# Patient Record
Sex: Male | Born: 1965
Health system: Southern US, Community
[De-identification: ages and names within clinical notes are randomized; demographics above are authoritative.]

## PROBLEM LIST (undated history)

## (undated) DIAGNOSIS — M79672 Pain in left foot: Secondary | ICD-10-CM

## (undated) DIAGNOSIS — R55 Syncope and collapse: Secondary | ICD-10-CM

## (undated) DIAGNOSIS — I219 Acute myocardial infarction, unspecified: Secondary | ICD-10-CM

## (undated) DIAGNOSIS — I2510 Atherosclerotic heart disease of native coronary artery without angina pectoris: Secondary | ICD-10-CM

## (undated) DIAGNOSIS — M79604 Pain in right leg: Secondary | ICD-10-CM

## (undated) DIAGNOSIS — R531 Weakness: Secondary | ICD-10-CM

## (undated) DIAGNOSIS — E870 Hyperosmolality and hypernatremia: Secondary | ICD-10-CM

## (undated) DIAGNOSIS — I739 Peripheral vascular disease, unspecified: Secondary | ICD-10-CM

## (undated) DIAGNOSIS — I10 Essential (primary) hypertension: Secondary | ICD-10-CM

## (undated) DIAGNOSIS — M79671 Pain in right foot: Secondary | ICD-10-CM

## (undated) DIAGNOSIS — E079 Disorder of thyroid, unspecified: Secondary | ICD-10-CM

## (undated) DIAGNOSIS — E039 Hypothyroidism, unspecified: Secondary | ICD-10-CM

## (undated) DIAGNOSIS — R350 Frequency of micturition: Secondary | ICD-10-CM

## (undated) DIAGNOSIS — N179 Acute kidney failure, unspecified: Secondary | ICD-10-CM

## (undated) DIAGNOSIS — D696 Thrombocytopenia, unspecified: Secondary | ICD-10-CM

## (undated) DIAGNOSIS — E785 Hyperlipidemia, unspecified: Secondary | ICD-10-CM

## (undated) DIAGNOSIS — M79605 Pain in left leg: Secondary | ICD-10-CM

## (undated) DIAGNOSIS — J449 Chronic obstructive pulmonary disease, unspecified: Secondary | ICD-10-CM

## (undated) DIAGNOSIS — N189 Chronic kidney disease, unspecified: Secondary | ICD-10-CM

## (undated) DIAGNOSIS — R42 Dizziness and giddiness: Secondary | ICD-10-CM

## (undated) DIAGNOSIS — K219 Gastro-esophageal reflux disease without esophagitis: Secondary | ICD-10-CM

## (undated) DIAGNOSIS — I509 Heart failure, unspecified: Secondary | ICD-10-CM

## (undated) DIAGNOSIS — E119 Type 2 diabetes mellitus without complications: Secondary | ICD-10-CM

## (undated) DIAGNOSIS — R739 Hyperglycemia, unspecified: Secondary | ICD-10-CM

## (undated) DIAGNOSIS — I428 Other cardiomyopathies: Secondary | ICD-10-CM

## (undated) HISTORY — DX: Hyperosmolality and hypernatremia: E87.0

## (undated) HISTORY — DX: Pain in right foot: M79.671

## (undated) HISTORY — DX: Acute kidney failure, unspecified: N17.9

## (undated) HISTORY — DX: Hyperlipidemia, unspecified: E78.5

## (undated) HISTORY — DX: Pain in left leg: M79.605

## (undated) HISTORY — PX: MULTIPLE TOOTH EXTRACTIONS: SHX2053

## (undated) HISTORY — DX: Atherosclerotic heart disease of native coronary artery without angina pectoris: I25.10

## (undated) HISTORY — DX: Pain in left foot: M79.672

## (undated) HISTORY — DX: Thrombocytopenia, unspecified: D69.6

## (undated) HISTORY — DX: Chronic kidney disease, unspecified: N17.9

## (undated) HISTORY — PX: VASCULAR SURGERY: SHX849

## (undated) HISTORY — DX: Peripheral vascular disease, unspecified: I73.9

## (undated) HISTORY — DX: Essential (primary) hypertension: I10

## (undated) HISTORY — PX: CARDIAC CATHETERIZATION: SHX172

## (undated) HISTORY — DX: Pain in right leg: M79.604

## (undated) HISTORY — DX: Chronic kidney disease, unspecified: N18.9

## (undated) HISTORY — DX: Frequency of micturition: R35.0

## (undated) HISTORY — DX: Dizziness and giddiness: R42

## (undated) HISTORY — DX: Hypothyroidism, unspecified: E03.9

## (undated) HISTORY — DX: Disorder of thyroid, unspecified: E07.9

## (undated) HISTORY — DX: Hyperglycemia, unspecified: R73.9

## (undated) HISTORY — DX: Other cardiomyopathies: I42.8

## (undated) HISTORY — DX: Type 2 diabetes mellitus without complications: E11.9

## (undated) HISTORY — DX: Weakness: R53.1

## (undated) HISTORY — DX: Syncope and collapse: R55

---

## 2012-12-30 ENCOUNTER — Ambulatory Visit (HOSPITAL_COMMUNITY): Payer: Self-pay | Admitting: Psychology

## 2018-10-30 DIAGNOSIS — E870 Hyperosmolality and hypernatremia: Secondary | ICD-10-CM

## 2018-10-30 HISTORY — DX: Hyperosmolality and hypernatremia: E87.0

## 2019-01-29 ENCOUNTER — Encounter: Payer: Self-pay | Admitting: Vascular Surgery

## 2019-02-04 ENCOUNTER — Ambulatory Visit (INDEPENDENT_AMBULATORY_CARE_PROVIDER_SITE_OTHER): Payer: Managed Care, Other (non HMO) | Admitting: Vascular Surgery

## 2019-02-04 ENCOUNTER — Other Ambulatory Visit: Payer: Self-pay | Admitting: *Deleted

## 2019-02-04 ENCOUNTER — Other Ambulatory Visit: Payer: Self-pay

## 2019-02-04 ENCOUNTER — Encounter: Payer: Self-pay | Admitting: *Deleted

## 2019-02-04 ENCOUNTER — Encounter: Payer: Self-pay | Admitting: Vascular Surgery

## 2019-02-04 VITALS — BP 189/98 | HR 106 | Temp 97.7°F | Resp 20 | Ht 73.0 in | Wt 182.0 lb

## 2019-02-04 DIAGNOSIS — I998 Other disorder of circulatory system: Secondary | ICD-10-CM | POA: Diagnosis not present

## 2019-02-04 DIAGNOSIS — I70229 Atherosclerosis of native arteries of extremities with rest pain, unspecified extremity: Secondary | ICD-10-CM

## 2019-02-04 NOTE — Progress Notes (Signed)
[facility]in [facility]ist of [place]  Patient name: Edward Rocha. MRN: [id number] DOB: November 11, 1965 Sex: male  REASON FOR CONSULT: Evaluation of bilateral lower extremity rest pain and critical limb ischemia  HPI: Edward Rocha. is a 53 y.o. male, who is here today for evaluation.  He is very poor medical compliance.  He had a hypoglycemic episode at [facility] rocking him and was seen in the emergency department on 01/14/2019.  Had blood sugar in excess of 600 and has renal insufficiency with a creatinine of 2.6.  He was treated and eventually discharged home.  He has severe pain in both lower extremities.  He does report claudication symptoms as well.  He has no open ulceration but does have very ischemic appearing feet with the scaling and cracking of the skin.  He has no history of cardiac disease.  Past Medical History:  Diagnosis Date  . Acute renal failure (ARF) ([place])   . Bilateral pain of leg and foot   . Diabetes mellitus without complication ([place])    Type II  . Dizziness   . Frequent urination   . Hyperglycemia    Non-Ketotic Hyperosmolar.  . Hyperlipidemia   . Hypernatremia   . Hypertension   . Peripheral vascular disease ([place])   . Syncope   . Thrombocytopenia ([place])   . Thyroid disease    Hypothyroidism  . Weakness     History reviewed. No pertinent family history.  SOCIAL HISTORY: Social History   Socioeconomic History  . Marital status: Married    Spouse name: Not on file  . Number of children: Not on file  . Years of education: Not on file  . Highest education level: Not on file  Occupational History  . Not on file  Social Needs  . Financial resource strain: Not on file  . Food insecurity:    Worry: Not on file    Inability: Not on file  . Transportation needs:    Medical: Not on file    Non-medical: Not on file  Tobacco Use  . Smoking status: Current Every Day Smoker    Packs/day: 1.00    Years: 36.00    Pack  years: 36.00    Types: Cigarettes  . Smokeless tobacco: Never Used  Substance and Sexual Activity  . Alcohol use: Never    Frequency: Never  . Drug use: Never  . Sexual activity: Not on file  Lifestyle  . Physical activity:    Days per week: Not on file    Minutes per session: Not on file  . Stress: Not on file  Relationships  . Social connections:    Talks on phone: Not on file    Gets together: Not on file    Attends religious service: Not on file    Active member of club or organization: Not on file    Attends meetings of clubs or organizations: Not on file    Relationship status: Not on file  . Intimate partner violence:    Fear of current or ex partner: Not on file    Emotionally abused: Not on file    Physically abused: Not on file    Forced sexual activity: Not on file  Other Topics Concern  . Not on file  Social History Narrative  . Not on file    No Known Allergies  Current Outpatient Medications  Medication Sig Dispense Refill  . amLODipine (NORVASC) 10 MG tablet Take 10 mg by mouth  daily.    . ATORVASTATIN CALCIUM PO Take 50 mg by mouth.    . chlorthalidone (HYGROTON) 25 MG tablet Take 25 mg by mouth daily.    . clopidogrel (PLAVIX) 75 MG tablet Take 75 mg by mouth daily.    . empagliflozin (JARDIANCE) 10 MG TABS tablet Take 10 mg by mouth daily.    Marland Kitchen gabapentin (NEURONTIN) 300 MG capsule Take 300 mg by mouth 3 (three) times daily.    Marland Kitchen glipiZIDE (GLUCOTROL) 5 MG tablet Take by mouth 2 (two) times daily before a meal.    . insulin glargine (LANTUS) 100 UNIT/ML injection Inject 20 Units into the skin daily.    Marland Kitchen labetalol (NORMODYNE) 300 MG tablet Take 300 mg by mouth 2 (two) times daily.    . Levothyroxine Sodium (SYNTHROID PO) Take by mouth.     No current facility-administered medications for this visit.     REVIEW OF SYSTEMS:  [X]  denotes positive finding, [ ]  denotes negative finding Cardiac  Comments:  Chest pain or chest pressure:    Shortness of  breath upon exertion:    Short of breath when lying flat:    Irregular heart rhythm:        Vascular    Pain in calf, thigh, or hip brought on by ambulation: x   Pain in feet at night that wakes you up from your sleep:  x   Blood clot in your veins:    Leg swelling:         Pulmonary    Oxygen at home:    Productive cough:     Wheezing:         Neurologic    Sudden weakness in arms or legs:     Sudden numbness in arms or legs:     Sudden onset of difficulty speaking or slurred speech:    Temporary loss of vision in one eye:     Problems with dizziness:         Gastrointestinal    Blood in stool:     Vomited blood:         Genitourinary    Burning when urinating:     Blood in urine:        Psychiatric    Major depression:         Hematologic    Bleeding problems:    Problems with blood clotting too easily:        Skin    Rashes or ulcers:        Constitutional    Fever or chills:      PHYSICAL EXAM: Vitals:   02/04/19 0857  BP: (!) 189/98  Pulse: (!) 106  Resp: 20  Temp: 97.7 F (36.5 C)  SpO2: 98%  Weight: 182 lb (82.6 kg)  Height: 6\' 1"  (1.854 m)    GENERAL: The patient is a well-nourished male, in no acute distress. The vital signs are documented above. CARDIOVASCULAR: Palpable radial pulses bilaterally.  Does have palpable femoral pulses with absent popliteal and distal pulses PULMONARY: There is good air exchange  ABDOMEN: Soft and non-tender  MUSCULOSKELETAL: There are no major deformities or cyanosis. NEUROLOGIC: No focal weakness or paresthesias are detected. SKIN: There are no ulcers or rashes noted. PSYCHIATRIC: The patient has a normal affect.  DATA:  Noninvasive studies at [facility] rocking him revealed monophasic noncompressible vessels in both lower extremities.  He does of note have right brachial pressure of 188 and left of 137  MEDICAL  ISSUES: Discussed these in detail with the patient.  Clearly has critical limb ischemia with threatened  limb loss bilaterally.  Recommended arteriography for further evaluation as an outpatient.  Explained that he may be a candidate for endovascular treatment.  Also explained that with his renal insufficiency, this does put him at some risk for arteriogram but certainly is a limb threatening ischemia.  Will minimize contrast.   Rosetta Posner, MD FACS [facility] Tel [phone] Pager [phone]

## 2019-02-04 NOTE — Progress Notes (Signed)
Left message on voice mail to arrive at [facility] admitting at 7:15 am on 02/06/2019 for procedure. All other instructions unchanged per letter given to patient in office. Verbalized understanding.

## 2019-02-06 ENCOUNTER — Other Ambulatory Visit: Payer: Self-pay

## 2019-02-06 ENCOUNTER — Ambulatory Visit (HOSPITAL_COMMUNITY)
Admission: RE | Admit: 2019-02-06 | Discharge: 2019-02-06 | Disposition: A | Discharge status: Home / Self Care | Payer: Managed Care, Other (non HMO) | Attending: Vascular Surgery | Admitting: Vascular Surgery

## 2019-02-06 ENCOUNTER — Encounter (HOSPITAL_COMMUNITY)
Admission: RE | Disposition: A | Discharge status: Home / Self Care | Payer: Self-pay | Source: Home / Self Care | Attending: Vascular Surgery

## 2019-02-06 DIAGNOSIS — E039 Hypothyroidism, unspecified: Secondary | ICD-10-CM | POA: Diagnosis not present

## 2019-02-06 DIAGNOSIS — I10 Essential (primary) hypertension: Secondary | ICD-10-CM | POA: Diagnosis not present

## 2019-02-06 DIAGNOSIS — Z79890 Hormone replacement therapy (postmenopausal): Secondary | ICD-10-CM | POA: Diagnosis not present

## 2019-02-06 DIAGNOSIS — E1151 Type 2 diabetes mellitus with diabetic peripheral angiopathy without gangrene: Secondary | ICD-10-CM | POA: Insufficient documentation

## 2019-02-06 DIAGNOSIS — I70223 Atherosclerosis of native arteries of extremities with rest pain, bilateral legs: Secondary | ICD-10-CM

## 2019-02-06 DIAGNOSIS — E785 Hyperlipidemia, unspecified: Secondary | ICD-10-CM | POA: Diagnosis not present

## 2019-02-06 DIAGNOSIS — Z794 Long term (current) use of insulin: Secondary | ICD-10-CM | POA: Insufficient documentation

## 2019-02-06 DIAGNOSIS — Z79899 Other long term (current) drug therapy: Secondary | ICD-10-CM | POA: Insufficient documentation

## 2019-02-06 DIAGNOSIS — F17210 Nicotine dependence, cigarettes, uncomplicated: Secondary | ICD-10-CM | POA: Diagnosis not present

## 2019-02-06 HISTORY — PX: PERIPHERAL VASCULAR INTERVENTION: CATH118257

## 2019-02-06 HISTORY — PX: ABDOMINAL AORTOGRAM W/LOWER EXTREMITY: CATH118223

## 2019-02-06 LAB — POCT ACTIVATED CLOTTING TIME
Activated Clotting Time: 400 seconds
Activated Clotting Time: 301 seconds
Activated Clotting Time: 257 seconds
Activated Clotting Time: 202 seconds
Activated Clotting Time: 169 seconds
Activated Clotting Time: 186 seconds

## 2019-02-06 LAB — GLUCOSE, CAPILLARY: Glucose-Capillary: 152 mg/dL — ABNORMAL HIGH (ref 70–99)

## 2019-02-06 LAB — POCT I-STAT 4, (NA,K, GLUC, HGB,HCT)
Glucose, Bld: 188 mg/dL — ABNORMAL HIGH (ref 70–99)
HCT: 36 % — ABNORMAL LOW (ref 39.0–52.0)
Hemoglobin: 12.2 g/dL — ABNORMAL LOW (ref 13.0–17.0)
Potassium: 3.9 mmol/L (ref 3.5–5.1)
Sodium: 139 mmol/L (ref 135–145)

## 2019-02-06 LAB — POCT I-STAT CREATININE: Creatinine, Ser: 0.9 mg/dL (ref 0.61–1.24)

## 2019-02-06 SURGERY — ABDOMINAL AORTOGRAM W/LOWER EXTREMITY
Anesthesia: LOCAL

## 2019-02-06 MED ORDER — LIDOCAINE HCL (PF) 1 % IJ SOLN
INTRAMUSCULAR | Status: AC
  Filled 2019-02-06: qty 30

## 2019-02-06 MED ORDER — HEPARIN SODIUM (PORCINE) 1000 UNIT/ML IJ SOLN
INTRAMUSCULAR | Status: DC | PRN
  Administered 2019-02-06: 9000 [IU] via INTRAVENOUS

## 2019-02-06 MED ORDER — HEPARIN (PORCINE) IN NACL 1000-0.9 UT/500ML-% IV SOLN
INTRAVENOUS | Status: DC | PRN
  Administered 2019-02-06: 500 mL

## 2019-02-06 MED ORDER — HEPARIN SODIUM (PORCINE) 1000 UNIT/ML IJ SOLN
INTRAMUSCULAR | Status: AC
  Filled 2019-02-06: qty 1

## 2019-02-06 MED ORDER — HYDRALAZINE HCL 20 MG/ML IJ SOLN
INTRAMUSCULAR | Status: AC
  Filled 2019-02-06: qty 1

## 2019-02-06 MED ORDER — SODIUM CHLORIDE 0.9 % IV SOLN
INTRAVENOUS | Status: DC
  Administered 2019-02-06: 07:00:00 via INTRAVENOUS

## 2019-02-06 MED ORDER — SODIUM CHLORIDE 0.9 % IV SOLN: 250.0000 mL | INTRAVENOUS | Status: DC | PRN

## 2019-02-06 MED ORDER — SODIUM CHLORIDE 0.9% FLUSH: 3.0000 mL | INTRAVENOUS | Status: DC | PRN

## 2019-02-06 MED ORDER — HYDRALAZINE HCL 20 MG/ML IJ SOLN
5.0000 mg | INTRAMUSCULAR | Status: DC | PRN
  Administered 2019-02-06: 5 mg via INTRAVENOUS

## 2019-02-06 MED ORDER — SODIUM CHLORIDE 0.9% FLUSH: 3.0000 mL | Freq: Two times a day (BID) | INTRAVENOUS | Status: DC

## 2019-02-06 MED ORDER — ACETAMINOPHEN 325 MG PO TABS: 650.0000 mg | ORAL_TABLET | ORAL | Status: DC | PRN

## 2019-02-06 MED ORDER — SODIUM CHLORIDE 0.9 % WEIGHT BASED INFUSION: 1.0000 mL/kg/h | INTRAVENOUS | Status: DC

## 2019-02-06 MED ORDER — LABETALOL HCL 5 MG/ML IV SOLN
INTRAVENOUS | Status: DC | PRN
  Administered 2019-02-06: 20 mg via INTRAVENOUS

## 2019-02-06 MED ORDER — LABETALOL HCL 5 MG/ML IV SOLN: 10.0000 mg | INTRAVENOUS | Status: DC | PRN

## 2019-02-06 MED ORDER — HEPARIN (PORCINE) IN NACL 1000-0.9 UT/500ML-% IV SOLN
INTRAVENOUS | Status: AC
  Filled 2019-02-06: qty 1000

## 2019-02-06 MED ORDER — LIDOCAINE HCL (PF) 1 % IJ SOLN
INTRAMUSCULAR | Status: DC | PRN
  Administered 2019-02-06 (×2): 15 mL

## 2019-02-06 MED ORDER — IODIXANOL 320 MG/ML IV SOLN
INTRAVENOUS | Status: DC | PRN
  Administered 2019-02-06: 12:00:00 150 mL via INTRAVENOUS

## 2019-02-06 MED ORDER — ONDANSETRON HCL 4 MG/2ML IJ SOLN: 4.0000 mg | Freq: Four times a day (QID) | INTRAMUSCULAR | Status: DC | PRN

## 2019-02-06 SURGICAL SUPPLY — 22 items
CATH BEACON 5 .035 65 KMP TIP (CATHETERS) ×6 IMPLANT
CATH OMNI FLUSH 5F 65CM (CATHETERS) ×3 IMPLANT
CLOSURE MYNX CONTROL 6F/7F (Vascular Products) ×3 IMPLANT
DEVICE TORQUE .025-.038 (MISCELLANEOUS) ×3 IMPLANT
FILTER CO2 0.2 MICRON (VASCULAR PRODUCTS) ×3 IMPLANT
GUIDEWIRE ANGLED .035X150CM (WIRE) ×3 IMPLANT
KIT ENCORE 26 ADVANTAGE (KITS) ×6 IMPLANT
KIT MICROPUNCTURE NIT STIFF (SHEATH) ×3 IMPLANT
KIT PV (KITS) ×3 IMPLANT
RESERVOIR CO2 (VASCULAR PRODUCTS) ×3 IMPLANT
SET FLUSH CO2 (MISCELLANEOUS) ×3 IMPLANT
SHEATH BRITE TIP 7FR 35CM (SHEATH) ×6 IMPLANT
SHEATH PINNACLE 5F 10CM (SHEATH) ×6 IMPLANT
SHEATH PINNACLE 7F 10CM (SHEATH) ×6 IMPLANT
SHEATH PROBE COVER 6X72 (BAG) ×3 IMPLANT
STENT VIABAHN 8X39X80 VBX (Permanent Stent) ×6 IMPLANT
SYR MEDRAD MARK 7 150ML (SYRINGE) ×3 IMPLANT
TRANSDUCER W/STOPCOCK (MISCELLANEOUS) ×3 IMPLANT
TRAY PV CATH (CUSTOM PROCEDURE TRAY) ×3 IMPLANT
WIRE AMPLATZ SS-J .035X180CM (WIRE) ×6 IMPLANT
WIRE BENTSON .035X145CM (WIRE) ×6 IMPLANT
WIRE TORQFLEX AUST .018X40CM (WIRE) ×6 IMPLANT

## 2019-02-06 NOTE — Discharge Instructions (Signed)

## 2019-02-06 NOTE — Op Note (Signed)
Patient name: Edward Rocha. MRN: [id number] DOB: [birth year] Sex: male  02/06/2019 Pre-operative Diagnosis: Critical limb ischemia of the bilateral lower extremities with rest pain Post-operative diagnosis:  Same Surgeon:  [name], MD Procedure Performed: 1.  Ultrasound-guided access of the right common femoral artery 2.  Aortogram with bilateral lower extremity arteriogram  3.  Ultrasound-guided access of the left common femoral artery 4.  Bilateral common iliac artery angioplasty with balloon expandable stent placement (8 mm x 39 mm VBX covered stent) 5.  Mynx closure of the left common femoral artery  Indications: Patient is a 53 year old male who was recently seen in clinic for bilateral lower extremity rest pain with his left leg more severe than the right.  He presents today for aortogram and bilateral lower extremity arteriogram and possible intervention.  On my initial evaluation in holding patient does have weak femoral pulses bilaterally.  We discussed risks and benefits.  Findings:  Aortogram showed patent single renal arteries bilaterally.  He has a very calcified aortoiliac segment.  The bilateral common iliac arteries are stenotic with approximate 50% stenosis of the right proximal common iliac and a greater than 80 to 90% stenosis of the left common iliac.  Both hypogastric arteries are patent although they are severely diseased with calcified high-grade stenosis proximally.  Bilateral external iliacs are widely patent.  The right common femoral artery has a large calcific plaque just before the takeoff of the SFA and the profunda likely creating a flow limiting stenosis. Left lower extremity runoff shows a patent common femoral, profunda, and SFA.  Patient has an occluded above and below-knee popliteal artery as well as an occluded trifurcation and reconstitutes what appears to be anterior tibial, posterior tibial, and peroneal with runoff into the foot. Right lower  extremity shows a patent common femoral that has got a large atherosclerotic plaque as previously noted with a patent profunda and SFA.  The above and below-knee popliteal artery are patent but there is a large calcific plaque in the below-knee popliteal artery at the takeoff of the anterior tibial the creates a greater than 90% high-grade stenosis.  The peroneal appears to be occluded proximally and reconstitutes.  The posterior tibial does appear to be patent.   Procedure:  The patient was identified in the holding area and taken to room 8.  The patient was then placed supine on the table and prepped and draped in the usual sterile fashion.  A time out was called.  Ultrasound was used to evaluate the right common femoral artery.  It was patent .  A digital ultrasound image was acquired.  A micropuncture needle was used to access the right common femoral artery under ultrasound guidance.  An 018 wire was advanced without resistance and a micropuncture sheath was placed.  The 018 wire was removed and a benson wire was placed.  The micropuncture sheath was exchanged for a 5 french sheath.  I could not get the Bentson wire to cross the right common iliac and as result I exchanged for a KMP catheter with a soft angled glide.  Ultimately once I was able to get my wire up into the infrarenal aorta across the right common iliac, I then exchanged back for a Bentson wire for more support.  I then put up a Omni Flush catheter and an aortogram was obtained with pertinent findings noted above.  Then pulled the catheter down to the aortic bifurcation and shot bilateral extremity runoff with pertinent findings  noted above.  Patient had severe common iliac disease bilaterally with weak femoral pulses so elected to treat this first.  I then used ultrasound to evaluate the left common femoral artery, it was patent, and an image was saved.  Under ultrasound guidance a micro access needle was placed in the left common femoral  artery.  Once the microwire was advanced, I exchanged for a micro-sheath in the left groin and again could not get a Benson wire to cross the left common iliac retrograde.  I then had placed a short 5 French sheath in the left groin and then using a KMP catheter and soft angled glide was able to cross the left common iliac in retrograde fashion.  Once I had my catheter up in the infrarenal aorta, I used a KMP to exchange for a Amplatz for more support.  We had selected 8 mm VBX stents for both iliacs, so I placed a long 7 French sheath bright tip sheath in the left groin.  The patient was given 100 units heparin per kilogram.  I then exchanged for Amplatz wire in the right groin and placed a long 7 French bright tip sheath as well.  Through retrograde injections of each sheath we identified the aorta bifurcation as well as the hypogastric arteries.  We selected a 8 mm x 39 mm covered VBX stents that were then placed through the sheath into the common iliacs bilaterally and the sheaths were pulled back so we did have to bareback the stents.  We then overinflated to nominal pressure of 16 to get an 8.5 mm VBX diameter stent and these were inflated simultaneously under fluoroscopic guidance.  We then removed balloon delivery devices from each groin and put a catheter back up the right groin and through the pigtail shot a final aortogram showed patent common iliacs bilaterally with no evidence of residual stenosis.  There was excellent flow down the iliacs.  I was concerned that there potentially could be a little atherosclerotic lesion still in the right iliac at the hypogastric so we did a left anterior oblique iliac shot on the right retrograde and that showed no residual stenosis there with excellent filling of the contralateral iliac as well through both stents are in place.  At that point time we exchanged for short 7 French sheaths in both groins.  All other wires and catheters were removed.  A mynx closure  device was deployed in the left groin given that that common femoral was patent.  He will be taken to PACU for removal of his right sheath with additional pressure given that he has atherosclerotic plaque with disease in the right common femoral artery and high concern to narrow that artery or Mynx failure.  Plan: I will plan to see the back in 1-2 months to see how he is doing after iliac stenting.  I anticipate if he has ongoing issues the next step could be a common femoral endarterectomy in the right groin.  On the left he has a popliteal occlusion of the above and below-knee popliteal artery that could also be addressed.    [name], MD [facility] of [place] Office: [phone] Pager: [place]

## 2019-02-06 NOTE — H&P (Signed)
History and Physical Interval Note:  02/06/2019 9:38 AM  Edward Patricia.  has presented today for surgery, with the diagnosis of pvd.  The various methods of treatment have been discussed with the patient and family. After consideration of risks, benefits and other options for treatment, the patient has consented to  Procedure(s): LOWER EXTREMITY ANGIOGRAPHY (N/A) as a surgical intervention.  The patient's history has been reviewed, patient examined, no change in status, stable for surgery.  I have reviewed the patient's chart and labs.  Questions were answered to the patient's satisfaction.    Aortogram, BLE arteriogram, BLE rest pain.  States left leg worse than right.  [name]  Vascular and Vein Specialist of [facility]  Patient name: Edward Rocha.         MRN: [id number]        DOB: Sep 19, 1966          Sex: male  REASON FOR CONSULT: Evaluation of bilateral lower extremity rest pain and critical limb ischemia  HPI: Edward Rocha. is a 53 y.o. male, who is here today for evaluation.  He is very poor medical compliance.  He had a hypoglycemic episode at [facility] rocking him and was seen in the emergency department on 01/14/2019.  Had blood sugar in excess of 600 and has renal insufficiency with a creatinine of 2.6.  He was treated and eventually discharged home.  He has severe pain in both lower extremities.  He does report claudication symptoms as well.  He has no open ulceration but does have very ischemic appearing feet with the scaling and cracking of the skin.  He has no history of cardiac disease.      Past Medical History:  Diagnosis Date  . Acute renal failure (ARF) ([place])   . Bilateral pain of leg and foot   . Diabetes mellitus without complication ([place])    Type II  . Dizziness   . Frequent urination   . Hyperglycemia    Non-Ketotic Hyperosmolar.  . Hyperlipidemia   . Hypernatremia   . Hypertension   . Peripheral vascular disease ([place])   . Syncope    . Thrombocytopenia ([place])   . Thyroid disease    Hypothyroidism  . Weakness     History reviewed. No pertinent family history.  SOCIAL HISTORY: Social History        Socioeconomic History  . Marital status: Married    Spouse name: Not on file  . Number of children: Not on file  . Years of education: Not on file  . Highest education level: Not on file  Occupational History  . Not on file  Social Needs  . Financial resource strain: Not on file  . Food insecurity:    Worry: Not on file    Inability: Not on file  . Transportation needs:    Medical: Not on file    Non-medical: Not on file  Tobacco Use  . Smoking status: Current Every Day Smoker    Packs/day: 1.00    Years: 36.00    Pack years: 36.00    Types: Cigarettes  . Smokeless tobacco: Never Used  Substance and Sexual Activity  . Alcohol use: Never    Frequency: Never  . Drug use: Never  . Sexual activity: Not on file  Lifestyle  . Physical activity:    Days per week: Not on file    Minutes per session: Not on file  . Stress: Not on file  Relationships  .  Social connections:    Talks on phone: Not on file    Gets together: Not on file    Attends religious service: Not on file    Active member of club or organization: Not on file    Attends meetings of clubs or organizations: Not on file    Relationship status: Not on file  . Intimate partner violence:    Fear of current or ex partner: Not on file    Emotionally abused: Not on file    Physically abused: Not on file    Forced sexual activity: Not on file  Other Topics Concern  . Not on file  Social History Narrative  . Not on file    No Known Allergies        Current Outpatient Medications  Medication Sig Dispense Refill  . amLODipine (NORVASC) 10 MG tablet Take 10 mg by mouth daily.    . ATORVASTATIN CALCIUM PO Take 50 mg by mouth.    . chlorthalidone (HYGROTON) 25 MG tablet Take 25 mg  by mouth daily.    . clopidogrel (PLAVIX) 75 MG tablet Take 75 mg by mouth daily.    . empagliflozin (JARDIANCE) 10 MG TABS tablet Take 10 mg by mouth daily.    Marland Kitchen gabapentin (NEURONTIN) 300 MG capsule Take 300 mg by mouth 3 (three) times daily.    Marland Kitchen glipiZIDE (GLUCOTROL) 5 MG tablet Take by mouth 2 (two) times daily before a meal.    . insulin glargine (LANTUS) 100 UNIT/ML injection Inject 20 Units into the skin daily.    Marland Kitchen labetalol (NORMODYNE) 300 MG tablet Take 300 mg by mouth 2 (two) times daily.    . Levothyroxine Sodium (SYNTHROID PO) Take by mouth.     No current facility-administered medications for this visit.     REVIEW OF SYSTEMS:  [X]  denotes positive finding, [ ]  denotes negative finding Cardiac  Comments:  Chest pain or chest pressure:    Shortness of breath upon exertion:    Short of breath when lying flat:    Irregular heart rhythm:        Vascular    Pain in calf, thigh, or hip brought on by ambulation: x   Pain in feet at night that wakes you up from your sleep:  x   Blood clot in your veins:    Leg swelling:         Pulmonary    Oxygen at home:    Productive cough:     Wheezing:         Neurologic    Sudden weakness in arms or legs:     Sudden numbness in arms or legs:     Sudden onset of difficulty speaking or slurred speech:    Temporary loss of vision in one eye:     Problems with dizziness:         Gastrointestinal    Blood in stool:     Vomited blood:         Genitourinary    Burning when urinating:     Blood in urine:        Psychiatric    Major depression:         Hematologic    Bleeding problems:    Problems with blood clotting too easily:        Skin    Rashes or ulcers:        Constitutional    Fever or chills:  PHYSICAL EXAM:    Vitals:   02/04/19 0857  BP: (!) 189/98  Pulse: (!) 106  Resp: 20  Temp:  97.7 F (36.5 C)  SpO2: 98%  Weight: 182 lb (82.6 kg)  Height: 6\' 1"  (1.854 m)    GENERAL: The patient is a well-nourished male, in no acute distress. The vital signs are documented above. CARDIOVASCULAR: Palpable radial pulses bilaterally.  Does have palpable femoral pulses with absent popliteal and distal pulses PULMONARY: There is good air exchange  ABDOMEN: Soft and non-tender  MUSCULOSKELETAL: There are no major deformities or cyanosis. NEUROLOGIC: No focal weakness or paresthesias are detected. SKIN: There are no ulcers or rashes noted. PSYCHIATRIC: The patient has a normal affect.  DATA:  Noninvasive studies at [facility] rocking him revealed monophasic noncompressible vessels in both lower extremities.  He does of note have right brachial pressure of 188 and left of 137  MEDICAL ISSUES: Discussed these in detail with the patient.  Clearly has critical limb ischemia with threatened limb loss bilaterally.  Recommended arteriography for further evaluation as an outpatient.  Explained that he may be a candidate for endovascular treatment.  Also explained that with his renal insufficiency, this does put him at some risk for arteriogram but certainly is a limb threatening ischemia.  Will minimize contrast.   Rosetta Posner, MD FACS [facility] Tel [phone] Pager [phone]

## 2019-02-06 NOTE — Progress Notes (Signed)
Site area: rt groin fa sheath pulled and pressure held by Edward Rocha Site Prior to Removal:  Level 0 Pressure Applied For: 25 minutes Manual:   yes Patient Status During Pull:  stable Rocha Pull Site:  Level 0 Rocha Pull Instructions Given:  yes Rocha Pull Pulses Present: rt dp dopplered Dressing Applied:  Gauze and tegaderm Bedrest begins @ 1600 Comments:

## 2019-02-07 ENCOUNTER — Encounter (HOSPITAL_COMMUNITY): Payer: Self-pay | Admitting: Vascular Surgery

## 2019-02-11 ENCOUNTER — Telehealth: Payer: Self-pay | Admitting: Vascular Surgery

## 2019-02-11 NOTE — Telephone Encounter (Signed)
-----   Message from [name], MD sent at 02/06/2019 12:16 PM EDT ----- Patient name: Edward Rocha.         MRN: [id number]        DOB: [birth year]          Sex: male  02/06/2019 Pre-operative Diagnosis: Critical limb ischemia of the bilateral lower extremities with rest pain Post-operative diagnosis:  Same Surgeon:  [name], MD Procedure Performed: 1.  Ultrasound-guided access of the right common femoral artery 2.  Aortogram with bilateral lower extremity arteriogram  3.  Ultrasound-guided access of the left common femoral artery 4.  Bilateral common iliac artery angioplasty with balloon expandable stent placement (8 mm x 39 mm VBX covered stent) 5.  Mynx closure of the left common femoral artery  #He can follow-up with me in 1-2 months with iliac duplex and ABI's.  Thanks,  Gerald Stabs

## 2019-02-11 NOTE — Telephone Encounter (Signed)
sch appt phone NA mld ltr 03/25/2019 9am Aorto/iliac 10am ABI 1015am p/o MD

## 2019-02-26 ENCOUNTER — Other Ambulatory Visit: Payer: Self-pay

## 2019-02-26 DIAGNOSIS — I998 Other disorder of circulatory system: Secondary | ICD-10-CM

## 2019-02-26 DIAGNOSIS — I70229 Atherosclerosis of native arteries of extremities with rest pain, unspecified extremity: Secondary | ICD-10-CM

## 2019-03-25 ENCOUNTER — Encounter: Payer: Self-pay | Admitting: Family

## 2019-03-25 ENCOUNTER — Encounter (HOSPITAL_COMMUNITY): Payer: Managed Care, Other (non HMO)

## 2019-03-25 ENCOUNTER — Encounter: Payer: Managed Care, Other (non HMO) | Admitting: Vascular Surgery

## 2019-04-15 ENCOUNTER — Telehealth: Payer: Self-pay

## 2019-04-15 NOTE — Telephone Encounter (Signed)
Wife called and asked that we call back regarding pt  Called and she said that he is having bilateral leg pain and his feet are hurting him worse. She said that he has had little relief since his procedure was done. She said that she would like to get him in to have someone at least take a look at him because he is having trouble just walking across the room.   Appt made for pt to be seen.   York Cerise, CMA

## 2019-04-18 ENCOUNTER — Other Ambulatory Visit: Payer: Self-pay

## 2019-04-18 ENCOUNTER — Ambulatory Visit (INDEPENDENT_AMBULATORY_CARE_PROVIDER_SITE_OTHER): Payer: Self-pay | Admitting: Family

## 2019-04-18 ENCOUNTER — Encounter: Payer: Self-pay | Admitting: Family

## 2019-04-18 VITALS — BP 173/94 | Temp 91.0°F | Resp 12 | Ht 73.0 in | Wt 206.3 lb

## 2019-04-18 DIAGNOSIS — M79672 Pain in left foot: Secondary | ICD-10-CM

## 2019-04-18 DIAGNOSIS — R0989 Other specified symptoms and signs involving the circulatory and respiratory systems: Secondary | ICD-10-CM

## 2019-04-18 DIAGNOSIS — F17200 Nicotine dependence, unspecified, uncomplicated: Secondary | ICD-10-CM

## 2019-04-18 DIAGNOSIS — M79671 Pain in right foot: Secondary | ICD-10-CM

## 2019-04-18 DIAGNOSIS — I779 Disorder of arteries and arterioles, unspecified: Secondary | ICD-10-CM

## 2019-04-18 DIAGNOSIS — E1151 Type 2 diabetes mellitus with diabetic peripheral angiopathy without gangrene: Secondary | ICD-10-CM

## 2019-04-18 NOTE — Progress Notes (Signed)
[facility] [place]   CC: Follow up peripheral artery occlusive disease  History of Present Illness Edward Rocha. is a 53 y.o. male who is s/p bilateral common iliac artery angioplasty with balloon expandable stent placement on 02-06-19 by Dr. Carlis Abbott for critical limb ischemia of the bilateral lower extremities with rest pain.  It appears that he has not followed up after the above procedure until today, with c/o pain in both feet, left worse than right, that started 2 weeks ago, all toes feel burning, the pain is intermittent.   Both calves hurt after walking about 100 feet, relieved by sitting.   Normal serum creatinine in April 2020 at 0.90.   Diabetic: Yes, does not know his last A1C (not on file), states his last glucose was 151.  Tobacco use: smoker  (1/4 ppd x 37 yrs), he has nicotine patches to pick up today.   Pt meds include: Statin :Yes Betablocker: Yes ASA: No Other anticoagulants/antiplatelets: Plavix  Past Medical History:  Diagnosis Date  . Acute renal failure (ARF) ([place])   . Bilateral pain of leg and foot   . Diabetes mellitus without complication ([place])    Type II  . Dizziness   . Frequent urination   . Hyperglycemia    Non-Ketotic Hyperosmolar.  . Hyperlipidemia   . Hypernatremia   . Hypertension   . Peripheral vascular disease ([place])   . Syncope   . Thrombocytopenia ([place])   . Thyroid disease    Hypothyroidism  . Weakness     Social History Social History   Tobacco Use  . Smoking status: Current Every Day Smoker    Packs/day: 1.00    Years: 36.00    Pack years: 36.00    Types: Cigarettes  . Smokeless tobacco: Never Used  Substance Use Topics  . Alcohol use: Never    Frequency: Never  . Drug use: Never    Family History History reviewed. No pertinent family history.  Past Surgical History:  Procedure Laterality Date  . ABDOMINAL AORTOGRAM W/LOWER EXTREMITY N/A 02/06/2019   Procedure: ABDOMINAL AORTOGRAM W/LOWER  EXTREMITY;  Surgeon: [name], MD;  Location: [facility];  Service: Cardiovascular;  Laterality: N/A;  bilateral  . PERIPHERAL VASCULAR INTERVENTION  02/06/2019   Procedure: PERIPHERAL VASCULAR INTERVENTION;  Surgeon: [name], MD;  Location: [facility];  Service: Cardiovascular;;  Bilateral Iliacs    No Known Allergies  Current Outpatient Medications  Medication Sig Dispense Refill  . amLODipine (NORVASC) 10 MG tablet Take 10 mg by mouth daily.    . ATORVASTATIN CALCIUM PO Take 50 mg by mouth.    . chlorthalidone (HYGROTON) 25 MG tablet Take 25 mg by mouth daily.    . clopidogrel (PLAVIX) 75 MG tablet Take 75 mg by mouth daily.    . empagliflozin (JARDIANCE) 10 MG TABS tablet Take 10 mg by mouth daily.    Marland Kitchen gabapentin (NEURONTIN) 300 MG capsule Take 300 mg by mouth 3 (three) times daily.    Marland Kitchen glipiZIDE (GLUCOTROL) 5 MG tablet Take by mouth 2 (two) times daily before a meal.    . insulin glargine (LANTUS) 100 UNIT/ML injection Inject 20 Units into the skin daily.    Marland Kitchen labetalol (NORMODYNE) 300 MG tablet Take 300 mg by mouth 2 (two) times daily.    . Levothyroxine Sodium (SYNTHROID PO) Take by mouth.     No current facility-administered medications for this visit.     ROS:  See HPI for pertinent positives and negatives.   Physical Examination  Vitals:   04/18/19 1230  BP: (!) 173/94  Resp: 12  Temp: (!) 91 F (32.8 C)  TempSrc: Oral  SpO2: 98%  Weight: 206 lb 4.8 oz (93.6 kg)  Height: 6\' 1"  (1.854 m)   Body mass index is 27.22 kg/m.  General: A&O x 3, WDWN, fit appearing male. Gait: normal HENT: No gross abnormalities.  Eyes: PERRLA. Pulmonary: Respirations are non labored, CTAB, good air movement in all fields Cardiac: regular rhythm, no detected murmur.         Carotid Bruits Right Left   Negative Negative   Radial pulses are 2+ palpable bilaterally   Adominal aortic pulse is not palpable                         VASCULAR  EXAM: Extremities without ischemic changes, without Gangrene; without open wounds. Tips of left toes are slightly darker than right toes.                                                                                                           LE Pulses Right Left       FEMORAL  2+ palpable  not palpable        POPLITEAL  not palpable   not palpable       POSTERIOR TIBIAL  not palpable, +brisk Doppler signals   not palpable, + brisk Doppler signals        DORSALIS PEDIS      ANTERIOR TIBIAL not palpable, +brisk Doppler signals  not palpable, +brisk Doppler signals    Abdomen: soft, NT, no palpable masses. Skin: no rashes, no cellulitis, no ulcers noted. See Extremities  Musculoskeletal: no muscle wasting or atrophy.  Neurologic: A&O X 3; appropriate affect, Sensation is normal; MOTOR FUNCTION:  moving all extremities equally, motor strength 5/5 throughout. Speech is fluent/normal. CN 2-12 intact. Psychiatric: Thought content is normal, mood appropriate for clinical situation.     ASSESSMENT: [name]. is a 53 y.o. male who is s/p bilateral common iliac artery angioplasty with balloon expandable stent placement on 02-06-19 by Dr. Carlis Abbott for critical limb ischemia of the bilateral lower extremities with rest pain.  It appears that he has not followed up after the above procedure until today, with c/o pain in both feet, left worse than right, that started 2 weeks ago, all toes feel burning, the pain is intermittent.   Both calves hurt after walking about 100 feet, relieved by sitting.   Normal serum creatinine in April 2020 at 0.90.   Left femoral pulse is not palpable, right is 2+ palpable. Bilateral pedal pulses are not palpable, but have brisk Doppler signals.  Tips of left toes are slightly darker than right toes.  No open wounds or gangrene.   His atherosclerotic risk factors include DM and 37 years of smoking, is trying to quit.   I discussed with Dr. Donzetta Matters pt HPI and  physical exam results. No  non invasive studies done today or recently. He has childcare issues that he needs to attend to in the next couple of hours and has to leave. Will bring pt back next week for bilateral aortoiliac duplex and ABI's, see Dr. Carlis Abbott after the testing.    PLAN:  Will bring pt back next week for bilateral aortoiliac duplex and ABI's, see Dr. Carlis Abbott after the testing.    Over 3 minutes was spent counseling patient re smoking cessation, and patient was given several free resources re smoking cessation.  I discussed in depth with the patient the nature of atherosclerosis, and emphasized the importance of maximal medical management including strict control of blood pressure, blood glucose, and lipid levels, obtaining regular exercise, and cessation of smoking.  The patient is aware that without maximal medical management the underlying atherosclerotic disease process will progress, limiting the benefit of any interventions.  The patient was given information about PAD including signs, symptoms, treatment, what symptoms should prompt the patient to seek immediate medical care, and risk reduction measures to take.  Clemon Chambers, RN, MSN, FNP-C [facility] Phone: [phone]  Clinic MD: Donzetta Matters  04/18/19 1:32 PM

## 2019-04-22 ENCOUNTER — Encounter: Payer: Managed Care, Other (non HMO) | Admitting: Vascular Surgery

## 2019-04-22 ENCOUNTER — Ambulatory Visit (HOSPITAL_COMMUNITY): Payer: Managed Care, Other (non HMO)

## 2019-04-22 ENCOUNTER — Encounter: Payer: Self-pay | Admitting: Vascular Surgery

## 2019-04-22 ENCOUNTER — Other Ambulatory Visit: Payer: Self-pay | Admitting: *Deleted

## 2019-04-22 ENCOUNTER — Ambulatory Visit (INDEPENDENT_AMBULATORY_CARE_PROVIDER_SITE_OTHER)
Admission: RE | Admit: 2019-04-22 | Discharge: 2019-04-22 | Disposition: A | Discharge status: Home / Self Care | Payer: Managed Care, Other (non HMO) | Source: Ambulatory Visit | Attending: Vascular Surgery | Admitting: Vascular Surgery

## 2019-04-22 ENCOUNTER — Ambulatory Visit (HOSPITAL_COMMUNITY)
Admission: RE | Admit: 2019-04-22 | Discharge: 2019-04-22 | Disposition: A | Discharge status: Home / Self Care | Payer: Managed Care, Other (non HMO) | Source: Ambulatory Visit | Attending: Vascular Surgery | Admitting: Vascular Surgery

## 2019-04-22 ENCOUNTER — Encounter (HOSPITAL_COMMUNITY): Payer: Self-pay

## 2019-04-22 ENCOUNTER — Ambulatory Visit (INDEPENDENT_AMBULATORY_CARE_PROVIDER_SITE_OTHER): Payer: Managed Care, Other (non HMO) | Admitting: Vascular Surgery

## 2019-04-22 ENCOUNTER — Encounter: Payer: Self-pay | Admitting: *Deleted

## 2019-04-22 ENCOUNTER — Other Ambulatory Visit: Payer: Self-pay

## 2019-04-22 DIAGNOSIS — I998 Other disorder of circulatory system: Secondary | ICD-10-CM

## 2019-04-22 DIAGNOSIS — I70229 Atherosclerosis of native arteries of extremities with rest pain, unspecified extremity: Secondary | ICD-10-CM

## 2019-04-22 NOTE — Progress Notes (Signed)
Patient name: Edward Rocha. MRN: [id number] DOB: [birth year] Sex: male  REASON FOR VISIT: Follow-up after bilateral common iliac stenting  HPI: Edward Keimig. is a 53 y.o. male with history of hypertension, hyperlipidemia, diabetes that presents for follow-up after bilateral common iliac stent placement on 02/06/2019 for critical ischemia with rest pain.  He was initially seen by Dr. Donnetta Hutching on 02/04/2019 with evidence of critical limb ischemia of the bilateral lower extremities with symptoms consistent with rest pain.  Ultimately underwent a aortogram lower extremity arteriogram with me on 02/06/2019.  At that time I placed bilateral common iliac stents.  He states that his symptoms are somewhat improved but he still having a fair amount of pain and discomfort in his left foot particularly at night.  He had a noninvasive imaging done today and it was a difficult to visualize the left and right iliac stents.  He does have a very weak left femoral pulse.  In addition in reviewing his runoff on the left leg he had a popliteal occlusion as well as occlusion of his trifurcation.  He has no tissue loss at this time.  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  Past Medical History:  Diagnosis Date  . Acute renal failure (ARF) ([place])   . Bilateral pain of leg and foot   . Diabetes mellitus without complication ([place])    Type II  . Dizziness   . Frequent urination   . Hyperglycemia    Non-Ketotic Hyperosmolar.  . Hyperlipidemia   . Hypernatremia   . Hypertension   . Peripheral vascular disease ([place])   . Syncope   . Thrombocytopenia ([place])   . Thyroid disease    Hypothyroidism  . Weakness     Past Surgical History:  Procedure Laterality Date  . ABDOMINAL AORTOGRAM W/LOWER EXTREMITY N/A 02/06/2019   Procedure: ABDOMINAL AORTOGRAM W/LOWER EXTREMITY;  Surgeon: [name], MD;  Location: [facility];  Service: Cardiovascular;  Laterality: N/A;  bilateral  . PERIPHERAL VASCULAR  INTERVENTION  02/06/2019   Procedure: PERIPHERAL VASCULAR INTERVENTION;  Surgeon: [name], MD;  Location: [facility];  Service: Cardiovascular;;  Bilateral Iliacs    Family History  Problem Relation Age of Onset  . Cancer Mother     SOCIAL HISTORY: Social History   Tobacco Use  . Smoking status: Current Every Day Smoker    Packs/day: 1.00    Years: 36.00    Pack years: 36.00    Types: Cigarettes  . Smokeless tobacco: Never Used  Substance Use Topics  . Alcohol use: Never    Frequency: Never    No Known Allergies  Current Outpatient Medications  Medication Sig Dispense Refill  . amLODipine (NORVASC) 10 MG tablet Take 10 mg by mouth daily.    Marland Kitchen atorvastatin (LIPITOR) 80 MG tablet     . chlorthalidone (HYGROTON) 25 MG tablet Take 25 mg by mouth daily.    . clopidogrel (PLAVIX) 75 MG tablet Take 75 mg by mouth daily.    . empagliflozin (JARDIANCE) 10 MG TABS tablet Take 10 mg by mouth daily.    Marland Kitchen gabapentin (NEURONTIN) 600 MG tablet     . glipiZIDE (GLUCOTROL) 5 MG tablet Take by mouth 2 (two) times daily before a meal.    . insulin glargine (LANTUS) 100 UNIT/ML injection Inject 20 Units into the skin daily.    Marland Kitchen labetalol (NORMODYNE) 300 MG tablet Take 300 mg by mouth  2 (two) times daily.    . Levothyroxine Sodium (SYNTHROID PO) Take by mouth.    . ATORVASTATIN CALCIUM PO Take 50 mg by mouth.    . gabapentin (NEURONTIN) 300 MG capsule Take 300 mg by mouth 3 (three) times daily.     No current facility-administered medications for this visit.     REVIEW OF SYSTEMS:  [X]  denotes positive finding, [ ]  denotes negative finding Cardiac  Comments:  Chest pain or chest pressure:    Shortness of breath upon exertion:    Short of breath when lying flat:    Irregular heart rhythm:        Vascular    Pain in calf, thigh, or hip brought on by ambulation:    Pain in feet at night that wakes you up from your sleep:  x Left worse  Blood clot in your veins:     Leg swelling:         Pulmonary    Oxygen at home:    Productive cough:     Wheezing:         Neurologic    Sudden weakness in arms or legs:     Sudden numbness in arms or legs:     Sudden onset of difficulty speaking or slurred speech:    Temporary loss of vision in one eye:     Problems with dizziness:         Gastrointestinal    Blood in stool:     Vomited blood:         Genitourinary    Burning when urinating:     Blood in urine:        Psychiatric    Major depression:         Hematologic    Bleeding problems:    Problems with blood clotting too easily:        Skin    Rashes or ulcers:        Constitutional    Fever or chills:      PHYSICAL EXAM: Vitals:   04/22/19 1028  BP: (!) 183/93  Pulse: 81  Resp: 18  Temp: 99.6 F (37.6 C)  TempSrc: Temporal  SpO2: 100%  Weight: 201 lb (91.2 kg)  Height: 6\' 1"  (1.854 m)    GENERAL: The patient is a well-nourished male, in no acute distress. The vital signs are documented above. CARDIAC: There is a regular rate and rhythm.  VASCULAR:  1+ right femoral pulse, very weak left femoral pulse No tissue loss lower extremities No palpable pedal pulses Monophasic DP/PT signals bilaterally PULMONARY: There is good air exchange bilaterally without wheezing or rales. ABDOMEN: Soft and non-tender with normal pitched bowel sounds.  MUSCULOSKELETAL: There are no major deformities or cyanosis. NEUROLOGIC: No focal weakness or paresthesias are detected.  DATA:   I independently reviewed his noninvasive imaging and largely noncompressible ABIs with monophasic waveforms at both ankles.  I could not adequately visualize his bilateral iliac stents - the distal external iliac arteries are patent distal to the stents.  Assessment/Plan:  53 year old male that was previously evaluated for critical limb ischemia of the bilateral lower extremities with severe rest pain.  Ultimately underwent bilateral common iliac stenting on  02/06/2019.  Today he reports some improvement in both legs, but the left foot is still bothersome with what sounds like rest pain at night and keeping him awake.  He does have a fairly weak femoral pulse in the left groin and the iliac  stents were difficult to visualize today given overlying bowel gas.  Discussed that I think we should take him back to the Cath Lab for another aortogram to ensure that the iliac stents remain patent and that I may need to extend the stents to treat any segment of adjacent disease.  If the stents look okay the next step would be a fem distal bypass in the left leg given that he has a popliteal and tibial trifurcation occlusion distally.  We will schedule for next week.   [name], MD [facility] Office: [phone] Pager: [place]

## 2019-04-28 ENCOUNTER — Other Ambulatory Visit (HOSPITAL_COMMUNITY)
Admission: RE | Admit: 2019-04-28 | Discharge: 2019-04-28 | Disposition: A | Discharge status: Home / Self Care | Payer: Managed Care, Other (non HMO) | Source: Ambulatory Visit | Attending: Vascular Surgery | Admitting: Vascular Surgery

## 2019-04-28 DIAGNOSIS — Z1159 Encounter for screening for other viral diseases: Secondary | ICD-10-CM | POA: Insufficient documentation

## 2019-04-28 DIAGNOSIS — Z01812 Encounter for preprocedural laboratory examination: Secondary | ICD-10-CM | POA: Insufficient documentation

## 2019-04-28 LAB — SARS CORONAVIRUS 2 BY RT PCR (HOSPITAL ORDER, PERFORMED IN ~~LOC~~ HOSPITAL LAB): SARS Coronavirus 2: NEGATIVE

## 2019-04-30 ENCOUNTER — Ambulatory Visit (HOSPITAL_COMMUNITY)
Admission: RE | Admit: 2019-04-30 | Discharge: 2019-04-30 | Disposition: A | Discharge status: Home / Self Care | Payer: Managed Care, Other (non HMO) | Attending: Vascular Surgery | Admitting: Vascular Surgery

## 2019-04-30 ENCOUNTER — Encounter (HOSPITAL_COMMUNITY)
Admission: RE | Disposition: A | Discharge status: Home / Self Care | Payer: Self-pay | Source: Home / Self Care | Attending: Vascular Surgery

## 2019-04-30 ENCOUNTER — Other Ambulatory Visit: Payer: Self-pay

## 2019-04-30 ENCOUNTER — Encounter (HOSPITAL_COMMUNITY): Payer: Self-pay | Admitting: Vascular Surgery

## 2019-04-30 DIAGNOSIS — I10 Essential (primary) hypertension: Secondary | ICD-10-CM | POA: Insufficient documentation

## 2019-04-30 DIAGNOSIS — E1165 Type 2 diabetes mellitus with hyperglycemia: Secondary | ICD-10-CM | POA: Diagnosis not present

## 2019-04-30 DIAGNOSIS — E039 Hypothyroidism, unspecified: Secondary | ICD-10-CM | POA: Diagnosis not present

## 2019-04-30 DIAGNOSIS — F17210 Nicotine dependence, cigarettes, uncomplicated: Secondary | ICD-10-CM | POA: Insufficient documentation

## 2019-04-30 DIAGNOSIS — E1151 Type 2 diabetes mellitus with diabetic peripheral angiopathy without gangrene: Secondary | ICD-10-CM | POA: Diagnosis not present

## 2019-04-30 DIAGNOSIS — Z79899 Other long term (current) drug therapy: Secondary | ICD-10-CM | POA: Diagnosis not present

## 2019-04-30 DIAGNOSIS — Z794 Long term (current) use of insulin: Secondary | ICD-10-CM | POA: Insufficient documentation

## 2019-04-30 DIAGNOSIS — N179 Acute kidney failure, unspecified: Secondary | ICD-10-CM | POA: Insufficient documentation

## 2019-04-30 DIAGNOSIS — Z7902 Long term (current) use of antithrombotics/antiplatelets: Secondary | ICD-10-CM | POA: Insufficient documentation

## 2019-04-30 DIAGNOSIS — E785 Hyperlipidemia, unspecified: Secondary | ICD-10-CM | POA: Insufficient documentation

## 2019-04-30 DIAGNOSIS — I70222 Atherosclerosis of native arteries of extremities with rest pain, left leg: Secondary | ICD-10-CM | POA: Insufficient documentation

## 2019-04-30 DIAGNOSIS — Z79890 Hormone replacement therapy (postmenopausal): Secondary | ICD-10-CM | POA: Insufficient documentation

## 2019-04-30 HISTORY — PX: PERIPHERAL VASCULAR INTERVENTION: CATH118257

## 2019-04-30 HISTORY — PX: ABDOMINAL AORTOGRAM W/LOWER EXTREMITY: CATH118223

## 2019-04-30 LAB — POCT I-STAT, CHEM 8
BUN: 18 mg/dL (ref 6–20)
Calcium, Ion: 1.17 mmol/L (ref 1.15–1.40)
Chloride: 105 mmol/L (ref 98–111)
Creatinine, Ser: 1.2 mg/dL (ref 0.61–1.24)
Glucose, Bld: 121 mg/dL — ABNORMAL HIGH (ref 70–99)
HCT: 40 % (ref 39.0–52.0)
Hemoglobin: 13.6 g/dL (ref 13.0–17.0)
Potassium: 3.5 mmol/L (ref 3.5–5.1)
Sodium: 141 mmol/L (ref 135–145)
TCO2: 27 mmol/L (ref 22–32)

## 2019-04-30 LAB — GLUCOSE, CAPILLARY: Glucose-Capillary: 95 mg/dL (ref 70–99)

## 2019-04-30 SURGERY — ABDOMINAL AORTOGRAM W/LOWER EXTREMITY
Anesthesia: LOCAL

## 2019-04-30 MED ORDER — FENTANYL CITRATE (PF) 100 MCG/2ML IJ SOLN
INTRAMUSCULAR | Status: DC | PRN
  Administered 2019-04-30: 25 ug via INTRAVENOUS

## 2019-04-30 MED ORDER — LIDOCAINE HCL (PF) 1 % IJ SOLN
INTRAMUSCULAR | Status: AC
  Filled 2019-04-30: qty 30

## 2019-04-30 MED ORDER — MIDAZOLAM HCL 2 MG/2ML IJ SOLN
INTRAMUSCULAR | Status: AC
  Filled 2019-04-30: qty 2

## 2019-04-30 MED ORDER — LIDOCAINE HCL (PF) 1 % IJ SOLN
INTRAMUSCULAR | Status: DC | PRN
  Administered 2019-04-30: 15 mL

## 2019-04-30 MED ORDER — HEPARIN SODIUM (PORCINE) 1000 UNIT/ML IJ SOLN
INTRAMUSCULAR | Status: DC | PRN
  Administered 2019-04-30: 9000 [IU] via INTRAVENOUS

## 2019-04-30 MED ORDER — HEPARIN (PORCINE) IN NACL 1000-0.9 UT/500ML-% IV SOLN
INTRAVENOUS | Status: AC
  Filled 2019-04-30: qty 1000

## 2019-04-30 MED ORDER — IODIXANOL 320 MG/ML IV SOLN
INTRAVENOUS | Status: DC | PRN
  Administered 2019-04-30: 200 mL via INTRA_ARTERIAL

## 2019-04-30 MED ORDER — LABETALOL HCL 5 MG/ML IV SOLN
INTRAVENOUS | Status: DC | PRN
  Administered 2019-04-30 (×2): 10 mg via INTRAVENOUS

## 2019-04-30 MED ORDER — HEPARIN SODIUM (PORCINE) 1000 UNIT/ML IJ SOLN
INTRAMUSCULAR | Status: AC
  Filled 2019-04-30: qty 1

## 2019-04-30 MED ORDER — HEPARIN (PORCINE) IN NACL 1000-0.9 UT/500ML-% IV SOLN
INTRAVENOUS | Status: DC | PRN
  Administered 2019-04-30: 500 mL

## 2019-04-30 MED ORDER — SODIUM CHLORIDE 0.9 % IV SOLN
INTRAVENOUS | Status: DC
  Administered 2019-04-30: 07:00:00 via INTRAVENOUS

## 2019-04-30 MED ORDER — MIDAZOLAM HCL 2 MG/2ML IJ SOLN
INTRAMUSCULAR | Status: DC | PRN
  Administered 2019-04-30: 1 mg via INTRAVENOUS

## 2019-04-30 MED ORDER — FENTANYL CITRATE (PF) 100 MCG/2ML IJ SOLN
INTRAMUSCULAR | Status: AC
  Filled 2019-04-30: qty 2

## 2019-04-30 SURGICAL SUPPLY — 19 items
BALLN MUSTANG 5.0X40 135 (BALLOONS) ×3
BALLOON MUSTANG 5.0X40 135 (BALLOONS) ×2 IMPLANT
CATH OMNI FLUSH 5F 65CM (CATHETERS) ×3 IMPLANT
CLOSURE MYNX CONTROL 6F/7F (Vascular Products) ×3 IMPLANT
DEVICE TORQUE .025-.038 (MISCELLANEOUS) ×3 IMPLANT
GUIDEWIRE ANGLED .035X150CM (WIRE) ×3 IMPLANT
KIT ENCORE 26 ADVANTAGE (KITS) ×3 IMPLANT
KIT MICROPUNCTURE NIT STIFF (SHEATH) ×3 IMPLANT
KIT PV (KITS) ×3 IMPLANT
SHEATH FLEX ANSEL ANG 6F 45CM (SHEATH) ×3 IMPLANT
SHEATH PINNACLE 5F 10CM (SHEATH) ×3 IMPLANT
SHEATH PINNACLE 6F 10CM (SHEATH) ×3 IMPLANT
SHEATH PROBE COVER 6X72 (BAG) ×3 IMPLANT
STENT INNOVA 6X40X130 (Permanent Stent) ×3 IMPLANT
SYR MEDRAD MARK V 150ML (SYRINGE) ×3 IMPLANT
TRANSDUCER W/STOPCOCK (MISCELLANEOUS) ×3 IMPLANT
TRAY PV CATH (CUSTOM PROCEDURE TRAY) ×3 IMPLANT
WIRE BENTSON .035X145CM (WIRE) ×3 IMPLANT
WIRE ROSEN-J .035X260CM (WIRE) ×3 IMPLANT

## 2019-04-30 NOTE — Op Note (Signed)
Patient name: Edward Rocha. MRN: [id number] DOB: 07-Jun-1966 Sex: male  04/30/2019 Pre-operative Diagnosis: Critical limb ischemia of the left lower extremity with rest pain Post-operative diagnosis:  Same Surgeon:  [name], MD Procedure Performed: 1.  Ultrasound-guided access of the right common femoral artery 2.  Aortogram with a dedicated left iliac arteriogram 3.  Left lower extremity arteriogram with selection of third order branches 4.  Left external iliac stent (6 mm x 40 mm Innova postdilated with a 5 mm x 40 mm Mustang balloon) 5.  Mynx closure of the right common femoral artery 6.  54 minutes of monitored moderate conscious sedation time  Indications: Patient is a 53 year old male who was previously seen for bilateral lower extremity rest pain.  Ultimately underwent kissing common iliac stents earlier this year on 02/06/19.  On follow-up at 1 month postop he continues to have rest pain in the left lower extremity (right leg improved).  Duplex in clinic had difficulty visualizing his common iliac stents with monophasic waveforms in the left external iliac and a decreased femoral pulse.  I subsequent brought him back today to evaluate his iliac stents to see if anything needed to be extended and to evaluate for bypasses target as his next step for intervention.  Risk and benefits were discussed.  Findings: Aortogram showed widely patent common iliac stents bilaterally.  On the left which was the side of interest he had a lot of bowel gas that made visualization of the proximal external iliac artery difficult.  We had some difficulty crossing this area and ultimately after doing pullback pressures there appeared to be at least a 30 - 40 mm Hg pressuregradient between the distal external iliac artery and the distal common iliac artery suggesting a proximal left external iliac stenosis of likely 60-70%.  I went ahead and got dedicated left lower extremity arteriogram images to  plan for bypass.  The most suitable target appears to be posterior tibial artery.  His popliteal artery as well as his trifurcation is all occluded as previously noted.  The peroneal is patent, the posterior tibial is occluded proximally, and the anterior tibial also occludes above the ankle and a collateral from the peroneal fills the dorsalis pedis. The left external iliac artery was treated proximally with a self-expanding 6 mm x 40 mm Innova postdilated with a 5 mm x 40 mm Mustang.  Patient had an improved left femoral pulse at completion of the the case.   Procedure:  The patient was identified in the holding area and taken to room 8.  The patient was then placed supine on the table and prepped and draped in the usual sterile fashion.  A time out was called.  Ultrasound was used to evaluate the right common femoral artery.  It was patent .  A digital ultrasound image was acquired.  A micropuncture needle was used to access the right common femoral artery under ultrasound guidance.  An 018 wire was advanced without resistance and a micropuncture sheath was placed.  The 018 wire was removed and a benson wire was placed.  The micropuncture sheath was exchanged for a 5 french sheath.  An omniflush catheter was advanced over the wire to the level of L-1.  An abdominal angiogram was obtained.  We also obtained dedicated iliac imaging on the left as noted above.  I then used Omni Flush catheter with a Bentson wire to cross the aortic bifurcation.  Had trouble getting the catheter tract and  had to use soft angled glide to get across the proximal left external iliac artery down to the SFA.  Finally got another catheter to track and I could get it down into the left common femoral.  We then did dedicated shots of the left lower extremity with hand-injection.  I got dedicated tibial shots as well as a lateral foot shot.  It appears that if his iliac stenting procedure does not improve his rest pain he will need a  femoral to posterior tibial bypass given popliteal and tibial trifurcation occlusion on the left.  The posterior tibial artery is the most brisk artery into the foot.  I then went back to the left external iliac artery where it was difficult to visualize given overlying bowel gas.  We performed pullback pressure with a Omni Flush catheter.  In the distal external iliac pressure was approximately 150 mm Hg and we pulled back into the common iliac it was 180 - 190 mmHg.  Given a 30-40 mm Hg pressure difference we elected to stent the proximal left external iliac.  I used a Biomedical scientist and then exchanged for a 6 [place] Ansell sheath over the aortic bifurcation, from the right groin.  Patient was given 100 units heparin per kilogram.  I then got one more dedicated image to preserve the hypogastric artery we selected a 6 mm x 40 mm self-expanding Innova that was deployed in the proximal left external iliac this was postdilated 5 mm Mustang balloon.  Patient had excellent left femoral pulse at completion of the case with preserved runoff.  The exchanged for short 6 French sheath in right groin.  Mynx closure device was deployed.  Taken to PACU in stable condition.  Plan: The previous kissing common iliac stents were widely patent bilaterally.  I placed an additional proximal left external iliac stent for a likely 60-70% stenosis.  No residual stenosis after deployment.  Discussed with the patient that next step would be a left common femoral to PT bypass.  Discussed if he wants me to go ahead and set him up for bypass, but he would like to monitor symptoms with additional stent placed today.  I will plan to see him back in 2 to 3 weeks in clinic.  [name], MD [facility] Office: [phone] Pager: [place]

## 2019-04-30 NOTE — H&P (Signed)
History and Physical Interval Note:  04/30/2019 8:40 AM  Edward Patricia.  has presented today for surgery, with the diagnosis of claudication.  The various methods of treatment have been discussed with the patient and family. After consideration of risks, benefits and other options for treatment, the patient has consented to  Procedure(s): ABDOMINAL AORTOGRAM W/LOWER EXTREMITY (N/A) as a surgical intervention.  The patient's history has been reviewed, patient examined, no change in status, stable for surgery.  I have reviewed the patient's chart and labs.  Questions were answered to the patient's satisfaction.    Aortogram - evaluate iliac stents - unable to visualize on duplex.  Will evaluate left leg runoff for potential target as well.    [name]  Patient name: Edward Rocha.         MRN: [id number]        DOB: [birth year]          Sex: male  REASON FOR VISIT: Follow-up after bilateral common iliac stenting  HPI: Edward Rocha. is a 53 y.o. male with history of hypertension, hyperlipidemia, diabetes that presents for follow-up after bilateral common iliac stent placement on 02/06/2019 for critical ischemia with rest pain.  He was initially seen by Dr. Donnetta Hutching on 02/04/2019 with evidence of critical limb ischemia of the bilateral lower extremities with symptoms consistent with rest pain.  Ultimately underwent a aortogram lower extremity arteriogram with me on 02/06/2019.  At that time I placed bilateral common iliac stents.  He states that his symptoms are somewhat improved but he still having a fair amount of pain and discomfort in his left foot particularly at night.  He had a noninvasive imaging done today and it was a difficult to visualize the left and right iliac stents.  He does have a very weak left femoral pulse.  In addition in reviewing his runoff on the left leg he had a popliteal occlusion as well as occlusion of his trifurcation.  He has no tissue loss at this time.  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      Past Medical History:  Diagnosis Date  . Acute renal failure (ARF) ([place])   . Bilateral pain of leg and foot   . Diabetes mellitus without complication ([place])    Type II  . Dizziness   . Frequent urination   . Hyperglycemia    Non-Ketotic Hyperosmolar.  . Hyperlipidemia   . Hypernatremia   . Hypertension   . Peripheral vascular disease ([place])   . Syncope   . Thrombocytopenia ([place])   . Thyroid disease    Hypothyroidism  . Weakness          Past Surgical History:  Procedure Laterality Date  . ABDOMINAL AORTOGRAM W/LOWER EXTREMITY N/A 02/06/2019   Procedure: ABDOMINAL AORTOGRAM W/LOWER EXTREMITY;  Surgeon: [name], MD;  Location: [facility];  Service: Cardiovascular;  Laterality: N/A;  bilateral  . PERIPHERAL VASCULAR INTERVENTION  02/06/2019   Procedure: PERIPHERAL VASCULAR INTERVENTION;  Surgeon: [name], MD;  Location: [facility];  Service: Cardiovascular;;  Bilateral Iliacs         Family History  Problem Relation Age of Onset  . Cancer Mother     SOCIAL HISTORY: Social History        Tobacco Use  . Smoking status: Current Every Day Smoker    Packs/day: 1.00    Years: 36.00  Pack years: 36.00    Types: Cigarettes  . Smokeless tobacco: Never Used  Substance Use Topics  . Alcohol use: Never    Frequency: Never    No Known Allergies        Current Outpatient Medications  Medication Sig Dispense Refill  . amLODipine (NORVASC) 10 MG tablet Take 10 mg by mouth daily.    Marland Kitchen atorvastatin (LIPITOR) 80 MG tablet     . chlorthalidone (HYGROTON) 25 MG tablet Take 25 mg by mouth daily.    . clopidogrel (PLAVIX) 75 MG tablet Take 75 mg by mouth daily.    . empagliflozin (JARDIANCE) 10 MG TABS tablet Take 10 mg by mouth daily.    Marland Kitchen gabapentin (NEURONTIN) 600 MG tablet     . glipiZIDE (GLUCOTROL) 5 MG tablet Take by mouth 2 (two)  times daily before a meal.    . insulin glargine (LANTUS) 100 UNIT/ML injection Inject 20 Units into the skin daily.    Marland Kitchen labetalol (NORMODYNE) 300 MG tablet Take 300 mg by mouth 2 (two) times daily.    . Levothyroxine Sodium (SYNTHROID PO) Take by mouth.    . ATORVASTATIN CALCIUM PO Take 50 mg by mouth.    . gabapentin (NEURONTIN) 300 MG capsule Take 300 mg by mouth 3 (three) times daily.     No current facility-administered medications for this visit.     REVIEW OF SYSTEMS:  [X]  denotes positive finding, [ ]  denotes negative finding Cardiac  Comments:  Chest pain or chest pressure:    Shortness of breath upon exertion:    Short of breath when lying flat:    Irregular heart rhythm:        Vascular    Pain in calf, thigh, or hip brought on by ambulation:    Pain in feet at night that wakes you up from your sleep:  x Left worse  Blood clot in your veins:    Leg swelling:         Pulmonary    Oxygen at home:    Productive cough:     Wheezing:         Neurologic    Sudden weakness in arms or legs:     Sudden numbness in arms or legs:     Sudden onset of difficulty speaking or slurred speech:    Temporary loss of vision in one eye:     Problems with dizziness:         Gastrointestinal    Blood in stool:     Vomited blood:         Genitourinary    Burning when urinating:     Blood in urine:        Psychiatric    Major depression:         Hematologic    Bleeding problems:    Problems with blood clotting too easily:        Skin    Rashes or ulcers:        Constitutional    Fever or chills:      PHYSICAL EXAM:    Vitals:   04/22/19 1028  BP: (!) 183/93  Pulse: 81  Resp: 18  Temp: 99.6 F (37.6 C)  TempSrc: Temporal  SpO2: 100%  Weight: 201 lb (91.2 kg)  Height: 6\' 1"  (1.854 m)    GENERAL: The patient is a well-nourished male, in no acute  distress. The vital signs are documented above. CARDIAC: There is a regular rate  and rhythm.  VASCULAR:  1+ right femoral pulse, very weak left femoral pulse No tissue loss lower extremities No palpable pedal pulses Monophasic DP/PT signals bilaterally PULMONARY: There is good air exchange bilaterally without wheezing or rales. ABDOMEN: Soft and non-tender with normal pitched bowel sounds.  MUSCULOSKELETAL: There are no major deformities or cyanosis. NEUROLOGIC: No focal weakness or paresthesias are detected.  DATA:   I independently reviewed his noninvasive imaging and largely noncompressible ABIs with monophasic waveforms at both ankles.  I could not adequately visualize his bilateral iliac stents - the distal external iliac arteries are patent distal to the stents.  Assessment/Plan:  53 year old male that was previously evaluated for critical limb ischemia of the bilateral lower extremities with severe rest pain.  Ultimately underwent bilateral common iliac stenting on 02/06/2019.  Today he reports some improvement in both legs, but the left foot is still bothersome with what sounds like rest pain at night and keeping him awake.  He does have a fairly weak femoral pulse in the left groin and the iliac stents were difficult to visualize today given overlying bowel gas.  Discussed that I think we should take him back to the Cath Lab for another aortogram to ensure that the iliac stents remain patent and that I may need to extend the stents to treat any segment of adjacent disease.  If the stents look okay the next step would be a fem distal bypass in the left leg given that he has a popliteal and tibial trifurcation occlusion distally.  We will schedule for next week.   [name], MD [facility] Office: [phone] Pager: [phone]

## 2019-04-30 NOTE — Progress Notes (Signed)
Updated given to wife pt went over for procedure.

## 2019-04-30 NOTE — Progress Notes (Signed)
Discharge instructions reviewed with pt wife voices understanding. (via Telephone)

## 2019-04-30 NOTE — Discharge Instructions (Signed)
Femoral Site Care °This sheet gives you information about how to care for yourself after your procedure. Your health care provider may also give you more specific instructions. If you have problems or questions, contact your health care provider. °What can I expect after the procedure? °After the procedure, it is common to have: °· Bruising that usually fades within 1-2 weeks. °· Tenderness at the site. °Follow these instructions at home: °Wound care °· Follow instructions from your health care provider about how to take care of your insertion site. Make sure you: °? Wash your hands with soap and water before you change your bandage (dressing). If soap and water are not available, use hand sanitizer. °? Change your dressing as told by your health care provider. °? Leave stitches (sutures), skin glue, or adhesive strips in place. These skin closures may need to stay in place for 2 weeks or longer. If adhesive strip edges start to loosen and curl up, you may trim the loose edges. Do not remove adhesive strips completely unless your health care provider tells you to do that. °· Do not take baths, swim, or use a hot tub until your health care provider approves. °· You may shower 24-48 hours after the procedure or as told by your health care provider. °? Gently wash the site with plain soap and water. °? Pat the area dry with a clean towel. °? Do not rub the site. This may cause bleeding. °· Do not apply powder or lotion to the site. Keep the site clean and dry. °· Check your femoral site every day for signs of infection. Check for: °? Redness, swelling, or pain. °? Fluid or blood. °? Warmth. °? Pus or a bad smell. °Activity °· For the first 2-3 days after your procedure, or as long as directed: °? Avoid climbing stairs as much as possible. °? Do not squat. °· Do not lift anything that is heavier than 10 lb (4.5 kg), or the limit that you are told, until your health care provider says that it is safe. °· Rest as  directed. °? Avoid sitting for a long time without moving. Get up to take short walks every 1-2 hours. °· Do not drive for 24 hours if you were given a medicine to help you relax (sedative). °General instructions °· Take over-the-counter and prescription medicines only as told by your health care provider. °· Keep all follow-up visits as told by your health care provider. This is important. °Contact a health care provider if you have: °· A fever or chills. °· You have redness, swelling, or pain around your insertion site. °Get help right away if: °· The catheter insertion area swells very fast. °· You pass out. °· You suddenly start to sweat or your skin gets clammy. °· The catheter insertion area is bleeding, and the bleeding does not stop when you hold steady pressure on the area. °· The area near or just beyond the catheter insertion site becomes pale, cool, tingly, or numb. °These symptoms may represent a serious problem that is an emergency. Do not wait to see if the symptoms will go away. Get medical help right away. Call your local emergency services (911 in the [place] Do not drive yourself to the hospital. °Summary °· After the procedure, it is common to have bruising that usually fades within 1-2 weeks. °· Check your femoral site every day for signs of infection. °· Do not lift anything that is heavier than 10 lb (4.5 kg), or the   limit that you are told, until your health care provider says that it is safe. °This information is not intended to replace advice given to you by your health care provider. Make sure you discuss any questions you have with your health care provider. °Document Released: 06/19/2014 Document Revised: 10/29/2017 Document Reviewed: 10/29/2017 °Elsevier Patient Education © 2020 [employer] ° °

## 2019-04-30 NOTE — Progress Notes (Addendum)
Ambulated in hallway tol well.

## 2019-05-13 ENCOUNTER — Other Ambulatory Visit: Payer: Self-pay

## 2019-05-13 DIAGNOSIS — I70229 Atherosclerosis of native arteries of extremities with rest pain, unspecified extremity: Secondary | ICD-10-CM

## 2019-05-13 DIAGNOSIS — I998 Other disorder of circulatory system: Secondary | ICD-10-CM

## 2019-05-19 ENCOUNTER — Telehealth (HOSPITAL_COMMUNITY): Payer: Self-pay

## 2019-05-19 NOTE — Telephone Encounter (Signed)
The above patient or their representative was contacted and gave the following answers to these questions:         Do you have any of the following symptoms?  no  Fever                    Cough                   Shortness of breath  Do  you have any of the following other symptoms? no   muscle pain         vomiting,        diarrhea        rash         weakness        red eye        abdominal pain         bruising          bruising or bleeding              joint pain           severe headache    Have you been in contact with someone who was or has been sick in the past 2 weeks?  no  Yes                 Unsure                         Unable to assess   Does the person that you were in contact with have any of the following symptoms?   Cough         shortness of breath           muscle pain         vomiting,            diarrhea            rash            weakness           fever            red eye           abdominal pain           bruising  or  bleeding                joint pain                severe headache               Have you  or someone you have been in contact with traveled internationally in th last month?   no      If yes, which countries?   Have you  or someone you have been in contact with traveled outside [place] in th last month?   no      If yes, which state and city?   COMMENTS OR ACTION PLAN FOR THIS PATIENT:          

## 2019-05-20 ENCOUNTER — Encounter: Payer: Managed Care, Other (non HMO) | Admitting: Vascular Surgery

## 2019-05-20 ENCOUNTER — Encounter (HOSPITAL_COMMUNITY): Payer: Managed Care, Other (non HMO)

## 2019-05-20 ENCOUNTER — Encounter: Payer: Self-pay | Admitting: Vascular Surgery

## 2019-05-20 ENCOUNTER — Ambulatory Visit (INDEPENDENT_AMBULATORY_CARE_PROVIDER_SITE_OTHER)
Admit: 2019-05-20 | Discharge: 2019-05-20 | Disposition: A | Discharge status: Home / Self Care | Payer: Managed Care, Other (non HMO) | Attending: Vascular Surgery | Admitting: Vascular Surgery

## 2019-05-20 ENCOUNTER — Ambulatory Visit (HOSPITAL_COMMUNITY)
Admission: RE | Admit: 2019-05-20 | Discharge: 2019-05-20 | Disposition: A | Discharge status: Home / Self Care | Payer: Managed Care, Other (non HMO) | Source: Ambulatory Visit | Attending: Vascular Surgery | Admitting: Vascular Surgery

## 2019-05-20 ENCOUNTER — Encounter: Payer: Self-pay | Admitting: *Deleted

## 2019-05-20 ENCOUNTER — Ambulatory Visit (INDEPENDENT_AMBULATORY_CARE_PROVIDER_SITE_OTHER): Payer: Managed Care, Other (non HMO) | Admitting: Vascular Surgery

## 2019-05-20 ENCOUNTER — Other Ambulatory Visit: Payer: Self-pay | Admitting: *Deleted

## 2019-05-20 ENCOUNTER — Other Ambulatory Visit: Payer: Self-pay

## 2019-05-20 VITALS — BP 191/90 | HR 87 | Temp 99.1°F | Resp 16 | Ht 73.0 in | Wt 212.0 lb

## 2019-05-20 DIAGNOSIS — I70229 Atherosclerosis of native arteries of extremities with rest pain, unspecified extremity: Secondary | ICD-10-CM

## 2019-05-20 DIAGNOSIS — I998 Other disorder of circulatory system: Secondary | ICD-10-CM | POA: Insufficient documentation

## 2019-05-20 NOTE — Progress Notes (Signed)
Patient name: Braxden Lovering. MRN: [id number] DOB: 1966/07/12 Sex: male  REASON FOR VISIT: Follow-up after bilateral common iliac stenting and left external iliac stenting.  HPI: Thedore Pickel. is a 53 y.o. male with history of hypertension, hyperlipidemia, diabetes that presents for follow-up after bilateral common iliac stent placement on 02/06/2019 and then left external iliac stent on 04/30/19.  He was initially seen by Dr. Donnetta Hutching on 02/04/2019 with evidence of critical limb ischemia of the bilateral lower extremities with symptoms consistent with rest pain.  Ultimately underwent aortogram, lower extremity arteriogram with me on 02/06/2019.  At that time I placed bilateral common iliac stents.  We took him back for another aortogram given that it is 1 month follow-up they could not visualize his common iliac stents with monophasic runoff in both iliacs.  During that time we did identify a left external iliac stenosis that was stented and his previous bilateral kissing stents were not patent.  He continues to complain of pain in his left leg.  He states no significant provement with the most recent left external iliac stent.  Past Medical History:  Diagnosis Date  . Acute renal failure (ARF) ([place])   . Bilateral pain of leg and foot   . Diabetes mellitus without complication ([place])    Type II  . Dizziness   . Frequent urination   . Hyperglycemia    Non-Ketotic Hyperosmolar.  . Hyperlipidemia   . Hypernatremia   . Hypertension   . Peripheral vascular disease ([place])   . Syncope   . Thrombocytopenia ([place])   . Thyroid disease    Hypothyroidism  . Weakness     Past Surgical History:  Procedure Laterality Date  . ABDOMINAL AORTOGRAM W/LOWER EXTREMITY N/A 02/06/2019   Procedure: ABDOMINAL AORTOGRAM W/LOWER EXTREMITY;  Surgeon: [name], MD;  Location: [facility];  Service: Cardiovascular;  Laterality: N/A;  bilateral  . ABDOMINAL AORTOGRAM W/LOWER EXTREMITY N/A 04/30/2019   Procedure: ABDOMINAL AORTOGRAM W/LOWER EXTREMITY;  Surgeon: [name], MD;  Location: [facility];  Service: Cardiovascular;  Laterality: N/A;  . PERIPHERAL VASCULAR INTERVENTION  02/06/2019   Procedure: PERIPHERAL VASCULAR INTERVENTION;  Surgeon: [name], MD;  Location: [facility];  Service: Cardiovascular;;  Bilateral Iliacs  . PERIPHERAL VASCULAR INTERVENTION  04/30/2019   Procedure: PERIPHERAL VASCULAR INTERVENTION;  Surgeon: [name], MD;  Location: [facility];  Service: Cardiovascular;;  Stent - Lt. Iliac     Family History  Problem Relation Age of Onset  . Cancer Mother     SOCIAL HISTORY: Social History   Tobacco Use  . Smoking status: Current Every Day Smoker    Packs/day: 1.00    Years: 36.00    Pack years: 36.00    Types: Cigarettes  . Smokeless tobacco: Never Used  Substance Use Topics  . Alcohol use: Never    Frequency: Never    No Known Allergies  Current Outpatient Medications  Medication Sig Dispense Refill  . acetaminophen (TYLENOL) 500 MG tablet Take 1,000 mg by mouth daily as needed for moderate pain or headache.    . chlorthalidone (HYGROTON) 50 MG tablet Take 50 mg by mouth daily.    . clopidogrel (PLAVIX) 75 MG tablet Take 75 mg by mouth daily.    . diphenhydrAMINE (BENADRYL) 25 MG tablet Take 25 mg by mouth daily as needed for itching.    . empagliflozin (JARDIANCE) 10 MG TABS tablet Take 10 mg  by mouth daily.    Marland Kitchen gabapentin (NEURONTIN) 300 MG capsule Take 300 mg by mouth 3 (three) times daily.    Marland Kitchen glipiZIDE (GLUCOTROL) 5 MG tablet Take 5 mg by mouth 2 (two) times daily before a meal.     . Insulin Glargine (BASAGLAR KWIKPEN) 100 UNIT/ML SOPN Inject 20 Units into the skin every evening.    . labetalol (NORMODYNE) 300 MG tablet Take 300 mg by mouth 2 (two) times daily.    . nicotine (NICODERM CQ - DOSED IN MG/24 HOURS) 21 mg/24hr patch Place 21 mg onto the skin daily.    . Omega-3 Fatty Acids (FISH  OIL PO) Take 1 capsule by mouth 2 (two) times a day.    Marland Kitchen atorvastatin (LIPITOR) 80 MG tablet Take 80 mg by mouth daily.      No current facility-administered medications for this visit.     REVIEW OF SYSTEMS:  [X]  denotes positive finding, [ ]  denotes negative finding Cardiac  Comments:  Chest pain or chest pressure:    Shortness of breath upon exertion:    Short of breath when lying flat:    Irregular heart rhythm:        Vascular    Pain in calf, thigh, or hip brought on by ambulation:    Pain in feet at night that wakes you up from your sleep:  x Left worse  Blood clot in your veins:    Leg swelling:         Pulmonary    Oxygen at home:    Productive cough:     Wheezing:         Neurologic    Sudden weakness in arms or legs:     Sudden numbness in arms or legs:     Sudden onset of difficulty speaking or slurred speech:    Temporary loss of vision in one eye:     Problems with dizziness:         Gastrointestinal    Blood in stool:     Vomited blood:         Genitourinary    Burning when urinating:     Blood in urine:        Psychiatric    Major depression:         Hematologic    Bleeding problems:    Problems with blood clotting too easily:        Skin    Rashes or ulcers:        Constitutional    Fever or chills:      PHYSICAL EXAM: Vitals:   05/20/19 0956 05/20/19 0959  BP: (!) 189/84 (!) 191/90  Pulse: 87 87  Resp: 18 16  Temp:  99.1 F (37.3 C)  TempSrc:  Temporal  SpO2:  100%  Weight: 212 lb (96.2 kg) 212 lb (96.2 kg)  Height: 6\' 1"  (1.854 m) 6\' 1"  (1.854 m)    GENERAL: The patient is a well-nourished male, in no acute distress. The vital signs are documented above. CARDIAC: There is a regular rate and rhythm.  VASCULAR:  1+ bilateral femoral pulse No tissue loss lower extremities No palpable pedal pulses Monophasic DP/PT signals bilaterally PULMONARY: There is good air exchange bilaterally without wheezing or rales. ABDOMEN: Soft  and non-tender with normal pitched bowel sounds.  MUSCULOSKELETAL: There are no major deformities or cyanosis. NEUROLOGIC: No focal weakness or paresthesias are detected.  DATA:   I independently reviewed his noninvasive imaging and largely  noncompressible ABIs with monophasic waveforms at both ankles.   Assessment/Plan:  53 year old male that was previously evaluated for critical limb ischemia of the bilateral lower extremities with severe rest pain.  Ultimately underwent bilateral common iliac stenting on 02/06/2019 and then extended left external iliac stent on 04/30/19.  Unfortunately still having  rest pain in the left foot that wakes him up at night.  His ABIs are noncompressible.  He has a left popliteal occlusion as well as a very diseased SFA and reconstitutes what appears to be posterior tibial as his best runoff.  Ultimately severe multi-level occlusive disease.  I have now recommended a left femoral to posterior tibial artery bypass.  I will obtain vein mapping of his lower and upper extremities to see what conduit we have.  He will be scheduled for surgery a week from Monday and states he does not have the ability to have surgery next week.  I will have him hold his Plavix one week prior to surgery, may continue as aspirin.   [name], MD [facility] Office: [phone] Pager: [place]

## 2019-05-23 ENCOUNTER — Other Ambulatory Visit: Payer: Self-pay

## 2019-05-23 DIAGNOSIS — I998 Other disorder of circulatory system: Secondary | ICD-10-CM

## 2019-05-23 DIAGNOSIS — I70229 Atherosclerosis of native arteries of extremities with rest pain, unspecified extremity: Secondary | ICD-10-CM

## 2019-05-26 NOTE — Progress Notes (Signed)
[facility] [phone] - [place], [place] AT [address] [place] [address] Phone: [phone] Fax: [phone]      Your procedure is scheduled on Monday, June 02, 2019.  Report to [facility] Main Entrance "A" at 0530 A.M., and check in at the Admitting office.  Call this number if you have problems the morning of surgery:  [phone]  Call [phone] if you have any questions prior to your surgery date Monday-Friday 8am-4pm    Remember:  Do not eat or drink after midnight the night before your surgery   Take these medicines the morning of surgery with A SIP OF WATER: Acetaminophen (Tylenol) - if needed Atorvastatin (Lipitor) Chlorthalidone (Hygroton) Diphenhydramine (Benadryl) - if needed Gabapentin (Neurontin) Labetalol (Normodyne)  7 days prior to surgery STOP taking any Aspirin (unless otherwise instructed by your surgeon), Aleve, Naproxen, Ibuprofen, Motrin, Advil, Goody's, BC's, all herbal medications, fish oil, and all vitamins.   WHAT DO I DO ABOUT MY DIABETES MEDICATION?  . DO NOT take oral diabetes medicines (pills) the morning of surgery.  Marland Kitchen HOLD Empagliflozin Jardiance the day before and the morning of surgery.  . THE DAY BEFORE SURGERY, take morning and lunch doses of  Glipizide (Glucotrol), do not take an evening dose.  . THE NIGHT BEFORE SURGERY, take 10 units of Glargine (Basaglar) insulin.   How to Manage Your Diabetes Before and After Surgery  Why is it important to control my blood sugar before and after surgery? . Improving blood sugar levels before and after surgery helps healing and can limit problems. . A way of improving blood sugar control is eating a healthy diet by: o  Eating less sugar and carbohydrates o  Increasing activity/exercise o  Talking with your doctor about reaching your blood sugar goals . High blood sugars (greater than 180 mg/dL) can raise your risk of  infections and slow your recovery, so you will need to focus on controlling your diabetes during the weeks before surgery. . Make sure that the doctor who takes care of your diabetes knows about your planned surgery including the date and location.  How do I manage my blood sugar before surgery? . Check your blood sugar at least 4 times a day, starting 2 days before surgery, to make sure that the level is not too high or low. o Check your blood sugar the morning of your surgery when you wake up and every 2 hours until you get to the Short Stay unit. . If your blood sugar is less than 70 mg/dL, you will need to treat for low blood sugar: o Do not take insulin. o Treat a low blood sugar (less than 70 mg/dL) with  cup of clear juice (cranberry or apple), 4 glucose tablets, OR glucose gel. o Recheck blood sugar in 15 minutes after treatment (to make sure it is greater than 70 mg/dL). If your blood sugar is not greater than 70 mg/dL on recheck, call [phone] for further instructions. . Report your blood sugar to the short stay nurse when you get to Short Stay.  . If you are admitted to the hospital after surgery: o Your blood sugar will be checked by the staff and you will probably be given insulin after surgery (instead of oral diabetes medicines) to make sure you have good blood sugar levels. o The goal for blood sugar control after surgery is 80-180 mg/dL.  The Morning of Surgery  Do not wear jewelry.  Do not wear lotions, powders, colognes, or deodorant  Men may shave face and neck.  Do not bring valuables to the hospital.  [facility] is not responsible for any belongings or valuables.  If you are a smoker, DO NOT Smoke 24 hours prior to surgery  If you wear a CPAP at night please bring your mask, tubing, and machine the morning of surgery   Remember that you must have someone to transport you home after your surgery, and remain with you for 24 hours if you are discharged the same  day.   Contacts, eyeglasses, hearing aids, dentures or bridgework may not be worn into surgery.    For patients admitted to the hospital, discharge time will be determined by your treatment team.  Patients discharged the day of surgery will not be allowed to drive home.    Special instructions:   [place]- Preparing For Surgery  Before surgery, you can play an important role. Because skin is not sterile, your skin needs to be as free of germs as possible. You can reduce the number of germs on your skin by washing with CHG (chlorahexidine gluconate) Soap before surgery.  CHG is an antiseptic cleaner which kills germs and bonds with the skin to continue killing germs even after washing.    Oral Hygiene is also important to reduce your risk of infection.  Remember - BRUSH YOUR TEETH THE MORNING OF SURGERY WITH YOUR REGULAR TOOTHPASTE  Please do not use if you have an allergy to CHG or antibacterial soaps. If your skin becomes reddened/irritated stop using the CHG.  Do not shave (including legs and underarms) for at least 48 hours prior to first CHG shower. It is OK to shave your face.  Please follow these instructions carefully.   1. Shower the NIGHT BEFORE SURGERY and the MORNING OF SURGERY with CHG Soap.   2. If you chose to wash your hair, wash your hair first as usual with your normal shampoo.  3. After you shampoo, rinse your hair and body thoroughly to remove the shampoo.  4. Use CHG as you would any other liquid soap. You can apply CHG directly to the skin and wash gently with a scrungie or a clean washcloth.   5. Apply the CHG Soap to your body ONLY FROM THE NECK DOWN.  Do not use on open wounds or open sores. Avoid contact with your eyes, ears, mouth and genitals (private parts). Wash Face and genitals (private parts)  with your normal soap.   6. Wash thoroughly, paying special attention to the area where your surgery will be performed.  7. Thoroughly rinse your body with  warm water from the neck down.  8. DO NOT shower/wash with your normal soap after using and rinsing off the CHG Soap.  9. Pat yourself dry with a CLEAN TOWEL.  10. Wear CLEAN PAJAMAS to bed the night before surgery, wear comfortable clothes the morning of surgery  11. Place CLEAN SHEETS on your bed the night of your first shower and DO NOT SLEEP WITH PETS.    Day of Surgery:  Please shower the morning of surgery with the CHG soap   Do not apply any deodorants/lotions. Please wear clean clothes to the hospital/surgery center.   Remember to brush your teeth WITH YOUR REGULAR TOOTHPASTE.   Please read over the following fact sheets that you were given.

## 2019-05-27 ENCOUNTER — Other Ambulatory Visit: Payer: Self-pay

## 2019-05-27 ENCOUNTER — Encounter (HOSPITAL_COMMUNITY): Payer: Self-pay

## 2019-05-27 ENCOUNTER — Telehealth: Payer: Self-pay | Admitting: *Deleted

## 2019-05-27 ENCOUNTER — Encounter (HOSPITAL_COMMUNITY)
Admission: RE | Admit: 2019-05-27 | Discharge: 2019-05-27 | Disposition: A | Discharge status: Home / Self Care | Payer: Managed Care, Other (non HMO) | Source: Ambulatory Visit | Attending: Vascular Surgery | Admitting: Vascular Surgery

## 2019-05-27 DIAGNOSIS — Z7982 Long term (current) use of aspirin: Secondary | ICD-10-CM | POA: Insufficient documentation

## 2019-05-27 DIAGNOSIS — Z79899 Other long term (current) drug therapy: Secondary | ICD-10-CM | POA: Insufficient documentation

## 2019-05-27 DIAGNOSIS — Z794 Long term (current) use of insulin: Secondary | ICD-10-CM | POA: Insufficient documentation

## 2019-05-27 DIAGNOSIS — Z01818 Encounter for other preprocedural examination: Secondary | ICD-10-CM | POA: Insufficient documentation

## 2019-05-27 DIAGNOSIS — I998 Other disorder of circulatory system: Secondary | ICD-10-CM | POA: Diagnosis not present

## 2019-05-27 DIAGNOSIS — I10 Essential (primary) hypertension: Secondary | ICD-10-CM | POA: Insufficient documentation

## 2019-05-27 DIAGNOSIS — E785 Hyperlipidemia, unspecified: Secondary | ICD-10-CM | POA: Insufficient documentation

## 2019-05-27 DIAGNOSIS — E039 Hypothyroidism, unspecified: Secondary | ICD-10-CM | POA: Insufficient documentation

## 2019-05-27 DIAGNOSIS — D696 Thrombocytopenia, unspecified: Secondary | ICD-10-CM

## 2019-05-27 DIAGNOSIS — Z87891 Personal history of nicotine dependence: Secondary | ICD-10-CM | POA: Insufficient documentation

## 2019-05-27 DIAGNOSIS — E1151 Type 2 diabetes mellitus with diabetic peripheral angiopathy without gangrene: Secondary | ICD-10-CM | POA: Insufficient documentation

## 2019-05-27 DIAGNOSIS — Z7902 Long term (current) use of antithrombotics/antiplatelets: Secondary | ICD-10-CM | POA: Insufficient documentation

## 2019-05-27 LAB — CBC
HCT: 43.4 % (ref 39.0–52.0)
Hemoglobin: 14.3 g/dL (ref 13.0–17.0)
MCH: 30.2 pg (ref 26.0–34.0)
MCHC: 32.9 g/dL (ref 30.0–36.0)
MCV: 91.6 fL (ref 80.0–100.0)
Platelets: 56 10*3/uL — ABNORMAL LOW (ref 150–400)
RBC: 4.74 MIL/uL (ref 4.22–5.81)
RDW: 12.8 % (ref 11.5–15.5)
WBC: 4.3 10*3/uL (ref 4.0–10.5)
nRBC: 0 % (ref 0.0–0.2)

## 2019-05-27 LAB — COMPREHENSIVE METABOLIC PANEL
ALT: 45 U/L — ABNORMAL HIGH (ref 0–44)
AST: 67 U/L — ABNORMAL HIGH (ref 15–41)
Albumin: 2.9 g/dL — ABNORMAL LOW (ref 3.5–5.0)
Alkaline Phosphatase: 80 U/L (ref 38–126)
Anion gap: 13 (ref 5–15)
BUN: 18 mg/dL (ref 6–20)
CO2: 22 mmol/L (ref 22–32)
Calcium: 9.5 mg/dL (ref 8.9–10.3)
Chloride: 101 mmol/L (ref 98–111)
Creatinine, Ser: 1.06 mg/dL (ref 0.61–1.24)
GFR calc Af Amer: >60 mL/min (ref >60)
GFR calc non Af Amer: >60 mL/min (ref >60)
Glucose, Bld: 94 mg/dL (ref 70–99)
Potassium: 3.2 mmol/L — ABNORMAL LOW (ref 3.5–5.1)
Sodium: 136 mmol/L (ref 135–145)
Total Bilirubin: 1.1 mg/dL (ref 0.3–1.2)
Total Protein: 9.5 g/dL — ABNORMAL HIGH (ref 6.5–8.1)

## 2019-05-27 LAB — URINALYSIS, ROUTINE W REFLEX MICROSCOPIC
Bacteria, UA: NONE SEEN
Bilirubin Urine: NEGATIVE
Glucose, UA: >=500 mg/dL — AB
Ketones, ur: NEGATIVE mg/dL
Leukocytes,Ua: NEGATIVE
Nitrite: NEGATIVE
Protein, ur: NEGATIVE mg/dL
Specific Gravity, Urine: 1.015 (ref 1.005–1.030)
pH: 5 (ref 5.0–8.0)

## 2019-05-27 LAB — ABO/RH: ABO/RH(D): O POS

## 2019-05-27 LAB — GLUCOSE, CAPILLARY: Glucose-Capillary: 106 mg/dL — ABNORMAL HIGH (ref 70–99)

## 2019-05-27 LAB — PROTIME-INR
INR: 1.3 — ABNORMAL HIGH (ref 0.8–1.2)
Prothrombin Time: 15.9 seconds — ABNORMAL HIGH (ref 11.4–15.2)

## 2019-05-27 LAB — APTT: aPTT: 36 seconds (ref 24–36)

## 2019-05-27 LAB — TYPE AND SCREEN
ABO/RH(D): O POS
Antibody Screen: NEGATIVE

## 2019-05-27 LAB — SURGICAL PCR SCREEN
MRSA, PCR: NEGATIVE
Staphylococcus aureus: POSITIVE — AB

## 2019-05-27 LAB — HEMOGLOBIN A1C
Hgb A1c MFr Bld: 6.3 % — ABNORMAL HIGH (ref 4.8–5.6)
Mean Plasma Glucose: 134.11 mg/dL

## 2019-05-27 NOTE — Progress Notes (Signed)
PCP - Dr. Stoney Bang Cardiologist - denies  Chest x-Sp[place]ler - N/A EKG - 02/06/19 Stress Test -denies  ECHO - denies Cardiac Cath - denies  Sleep Study - denies CPAP - denies  Fasting Blood Sugar - 95-110 Checks Blood Sugar 2 times a day CBG at PAT 106 Will collect A1C this visit.   Blood Thinner Instructions: Plavix, per pt, LD will be 05/29/19.   Aspirin Instructions:N/A  Anesthesia review: No.   Patient denies shortness of breath, fever, cough and chest pain at PAT appointment   Patient verbalized understanding of instructions that were given to them at the PAT appointment. Patient was also instructed that they will need to review over the PAT instructions again at home before surgery.  Scheduled for covid testing on 05/30/19.    Coronavirus Screening  Have you experienced the following symptoms:  Cough yes/no: No Fever (>100.49F)  yes/no: No Runny nose yes/no: No Sore throat yes/no: No Difficulty breathing/shortness of breath  yes/no: No  Have you or a family member traveled in the last 14 days and where? yes/no: No   If the patient indicates "YES" to the above questions, their PAT will be rescheduled to limit the exposure to others and, the surgeon will be notified. THE PATIENT WILL NEED TO BE ASYMPTOMATIC FOR 14 DAYS.   If the patient is not experiencing any of these symptoms, the PAT nurse will instruct them to NOT bring anyone with them to their appointment since they may have these symptoms or traveled as well.   Please remind your patients and families that hospital visitation restrictions are in effect and the importance of the restrictions.

## 2019-05-27 NOTE — Progress Notes (Signed)
[facility] [phone] - [place], [place] [address] [place]  [address] Phone: [phone] Fax: [phone]      Your procedure is scheduled on Monday, June 02, 2019.  Report to [facility] Main Entrance "A" at 0530 A.M., and check in at the Admitting office.  Call this number if you have problems the morning of surgery:  [phone]  Call [phone] if you have any questions prior to your surgery date Monday-Friday 8am-4pm    Remember:  Do not eat or drink after midnight the night before your surgery   Take these medicines the morning of surgery with A SIP OF WATER: Acetaminophen (Tylenol) - if needed Atorvastatin (Lipitor) Chlorthalidone (Hygroton) Diphenhydramine (Benadryl) - if needed Gabapentin (Neurontin) Labetalol (Normodyne)  Follow your surgeon's instructions on when to stop/resume clopidogrel (PLAVIX).  If no instructions were given by your surgeon then you will need to call the office to get those instructions.    As of today, STOP taking any Aspirin (unless otherwise instructed by your surgeon), Aleve, Naproxen, Ibuprofen, Motrin, Advil, Goody's, BC's, all herbal medications, fish oil, and all vitamins.   WHAT DO I DO ABOUT MY DIABETES MEDICATION?  . DO NOT take oral diabetes medicines (pills) the morning of surgery.  Marland Kitchen HOLD Empagliflozin Jardiance the day before and the morning of surgery.  . THE DAY BEFORE SURGERY, take morning and lunch doses of Glipizide (Glucotrol), do not take an evening dose.  . THE NIGHT BEFORE SURGERY, take 10 units of Glargine (Basaglar) insulin.   How to Manage Your Diabetes Before and After Surgery  Why is it important to control my blood sugar before and after surgery? . Improving blood sugar levels before and after surgery helps healing and can limit problems. . A way of improving blood sugar control is eating a healthy diet by: o  Eating less sugar and  carbohydrates o  Increasing activity/exercise o  Talking with your doctor about reaching your blood sugar goals . High blood sugars (greater than 180 mg/dL) can raise your risk of infections and slow your recovery, so you will need to focus on controlling your diabetes during the weeks before surgery. . Make sure that the doctor who takes care of your diabetes knows about your planned surgery including the date and location.  How do I manage my blood sugar before surgery? . Check your blood sugar at least 4 times a day, starting 2 days before surgery, to make sure that the level is not too high or low. o Check your blood sugar the morning of your surgery when you wake up and every 2 hours until you get to the Short Stay unit. . If your blood sugar is less than 70 mg/dL, you will need to treat for low blood sugar: o Do not take insulin. o Treat a low blood sugar (less than 70 mg/dL) with  cup of clear juice (cranberry or apple), 4 glucose tablets, OR glucose gel. o Recheck blood sugar in 15 minutes after treatment (to make sure it is greater than 70 mg/dL). If your blood sugar is not greater than 70 mg/dL on recheck, call [phone] for further instructions. . Report your blood sugar to the short stay nurse when you get to Short Stay.  . If you are admitted to the hospital after surgery: o Your blood sugar will be checked by the staff and you will probably be  given insulin after surgery (instead of oral diabetes medicines) to make sure you have good blood sugar levels. o The goal for blood sugar control after surgery is 80-180 mg/dL.    The Morning of Surgery  Do not wear jewelry.  Do not wear lotions, powders, colognes, or deodorant  Men may shave face and neck.  Do not bring valuables to the hospital.  [facility] is not responsible for any belongings or valuables.  If you are a smoker, DO NOT Smoke 24 hours prior to surgery  If you wear a CPAP at night please bring your mask,  tubing, and machine the morning of surgery   Remember that you must have someone to transport you home after your surgery, and remain with you for 24 hours if you are discharged the same day.   Contacts, eyeglasses, hearing aids, dentures or bridgework may not be worn into surgery.    For patients admitted to the hospital, discharge time will be determined by your treatment team.  Patients discharged the day of surgery will not be allowed to drive home.    Special instructions:   [place]- Preparing For Surgery  Before surgery, you can play an important role. Because skin is not sterile, your skin needs to be as free of germs as possible. You can reduce the number of germs on your skin by washing with CHG (chlorahexidine gluconate) Soap before surgery.  CHG is an antiseptic cleaner which kills germs and bonds with the skin to continue killing germs even after washing.    Oral Hygiene is also important to reduce your risk of infection.  Remember - BRUSH YOUR TEETH THE MORNING OF SURGERY WITH YOUR REGULAR TOOTHPASTE  Please do not use if you have an allergy to CHG or antibacterial soaps. If your skin becomes reddened/irritated stop using the CHG.  Do not shave (including legs and underarms) for at least 48 hours prior to first CHG shower. It is OK to shave your face.  Please follow these instructions carefully.   1. Shower the NIGHT BEFORE SURGERY and the MORNING OF SURGERY with CHG Soap.   2. If you chose to wash your hair, wash your hair first as usual with your normal shampoo.  3. After you shampoo, rinse your hair and body thoroughly to remove the shampoo.  4. Use CHG as you would any other liquid soap. You can apply CHG directly to the skin and wash gently with a scrungie or a clean washcloth.   5. Apply the CHG Soap to your body ONLY FROM THE NECK DOWN.  Do not use on open wounds or open sores. Avoid contact with your eyes, ears, mouth and genitals (private parts). Wash Face  and genitals (private parts)  with your normal soap.   6. Wash thoroughly, paying special attention to the area where your surgery will be performed.  7. Thoroughly rinse your body with warm water from the neck down.  8. DO NOT shower/wash with your normal soap after using and rinsing off the CHG Soap.  9. Pat yourself dry with a CLEAN TOWEL.  10. Wear CLEAN PAJAMAS to bed the night before surgery, wear comfortable clothes the morning of surgery  11. Place CLEAN SHEETS on your bed the night of your first shower and DO NOT SLEEP WITH PETS.    Day of Surgery:  Please shower the morning of surgery with the CHG soap   Do not apply any deodorants/lotions. Please wear clean clothes to the hospital/surgery  center.   Remember to brush your teeth WITH YOUR REGULAR TOOTHPASTE.   Please read over the following fact sheets that you were given.

## 2019-05-27 NOTE — Progress Notes (Signed)
Edward Rocha at Dr. Ainsley Spinner office notified regarding pt's platelet level. Edward Rocha will make Dr. Carlis Abbott aware.  Jacqlyn Larsen, RN

## 2019-05-27 NOTE — Telephone Encounter (Signed)
Per Dr. Carlis Abbott, this patient will need at hematology referral asap to address low platelets (56,000) on Preop labs. Surgery will need to be postponed pending this evaluation.

## 2019-05-28 ENCOUNTER — Encounter (HOSPITAL_COMMUNITY): Payer: Self-pay | Admitting: Surgery

## 2019-05-28 NOTE — Progress Notes (Addendum)
Anesthesia Chart Review:  Case: [id number] Date/Time: 06/02/19 0715   Procedure: BYPASS GRAFT FEMORAL-DISTAL POPLITEAL ARTERY (Left )   Anesthesia type: General   Pre-op diagnosis: LEFT LEG PERIPHERAL VASCULAR DISEASE   Location: [facility] OR ROOM 12 / [place] [facility]   Surgeon: [name], MD      DISCUSSION: Patient is a 53 year old male scheduled for the above procedure.  History includes former smoker (quit 01/25/19), PVD (s/p bilateral CIA stents 02/06/19), thrombocytopenia, HTN, HLD, DM2 (with history of non-ketotic hypersosmolar syndrome), hypothyroidism, syncope.   No specific details currently available regarding thrombocytopenia and prior acute renal failure and syncope history. Cr 1.06. A1c 6.3%. PLT count 56K and LFTs mildly elevated with AST 67, ALT 45 on PAT labs. PT/INR 15.9/1.3. No reported ETOH or illicit drug use. Records from Dr. Sherrie Sport requested.   Last Plavix was scheduled for 05/28/19 with continuation of ASA.   Dr. Carlis Abbott has reviewed labs, and is referring patient to hematology. Patient has an appointment with Derek Jack, MD on 05/29/19. Chart will be left for follow-up PCP and hematology records.    ADDENDUM 05/30/19 1:44 PM: PCP records still pending. I called and spoke with Mr. Brafford on 05/29/19. He reported that syncopal episode was earlier this year when he was hospitalized with hyperglycemia. He was seen at [facility] [address]. To his knowledge syncope was attributed to his hyperglycemia and did not recall a specific work-up. No other syncopal episodes. Denied chest pain, SOB. He denied ETOH/beer use (although appears to have prior history). He is not very active due to his claudication/rest pain. 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 Denied known CAD/MI. He is unaware of details regarding of acute renal failure, and does not see a nephrologist. BUN and Creatinine were normal with PAT labs. I was able to get records from [facility]. He was admitted  there by Dr. Sherrie Sport 01/14/19-01/17/19 after syncopal episode and falling off porch. Labs showed Glucose 725, Na 117, Cr 2.61, PLT 47K, ALT 70, AST 87 and was admitted with primary diagnoses with nonketotic hyperosmolar hyperglycemia, hypernatremia, and acute renal failure. Apparently was a known diabetic at that time with prior A1c of 15.0. Although thrombocytopenia noted during that hospitalization, I did not see any specific details regarding possible etiologies. He has had ongoing follow-up with Dr. Sherrie Sport since his discharge. A1c now at 6.3%.   Patient was seen by hematology 05/29/19 with labs and testing ordered. Surgery currently appears to be rescheduled for 06/05/19, although patient told me that was still not definitive. Chart will be left for follow-up.    VS: BP (!) 183/88   Pulse (!) 107   Temp 36.5 C (Oral)   Resp 20   Ht 6\' 1"  (1.854 m)   Wt 98.1 kg   SpO2 100%   BMI 28.53 kg/m    PROVIDERS: Neale Burly, MD is PCP. Records pending.   LABS: See DISCUSSION. (all labs ordered are listed, but only abnormal results are displayed)  Labs Reviewed  SURGICAL PCR SCREEN - Abnormal; Notable for the following components:      Result Value   Staphylococcus aureus POSITIVE (*)    All other components within normal limits  GLUCOSE, CAPILLARY - Abnormal; Notable for the following components:   Glucose-Capillary 106 (*)    All other components within normal limits  URINALYSIS, ROUTINE W REFLEX MICROSCOPIC - Abnormal; Notable for the following components:   Glucose, UA >=500 (*)    Hgb urine dipstick SMALL (*)  All other components within normal limits  CBC - Abnormal; Notable for the following components:   Platelets 56 (*)    All other components within normal limits  COMPREHENSIVE METABOLIC PANEL - Abnormal; Notable for the following components:   Potassium 3.2 (*)    Total Protein 9.5 (*)    Albumin 2.9 (*)    AST 67 (*)    ALT 45 (*)    All other components within  normal limits  PROTIME-INR - Abnormal; Notable for the following components:   Prothrombin Time 15.9 (*)    INR 1.3 (*)    All other components within normal limits  HEMOGLOBIN A1C - Abnormal; Notable for the following components:   Hgb A1c MFr Bld 6.3 (*)    All other components within normal limits  APTT  TYPE AND SCREEN  ABO/RH     IMAGES: None available for review.    EKG: 02/06/2019: Normal sinus rhythm Early repolarization Normal ECG No old tracing to compare Confirmed by Glori Bickers [phone]) on 02/06/2019 8:33:59 PM - Overall, I think tracing is stable when compared to 01/14/19 tracing from [facility]  CV: Denied prior cath, stress, echo.   Past Medical History:  Diagnosis Date  . Acute renal failure (ARF) ([place])    12/2018 when admitted for hyperosmolar hyperglycemic nonketotic syndrome  . Bilateral pain of leg and foot   . Diabetes mellitus without complication ([place])    Type II  . Dizziness   . Frequent urination   . Hyperglycemia    Non-Ketotic Hyperosmolar.  . Hyperlipidemia   . Hypernatremia   . Hypertension   . Peripheral vascular disease ([place])   . Syncope    12/2018, diagnosed with hyperosmolar hyperglycemic nonketotic syndrome, AKI, hyponatremia ([facility])  . Thrombocytopenia ([place])   . Thyroid disease    Hypothyroidism  . Weakness     Past Surgical History:  Procedure Laterality Date  . ABDOMINAL AORTOGRAM W/LOWER EXTREMITY N/A 02/06/2019   Procedure: ABDOMINAL AORTOGRAM W/LOWER EXTREMITY;  Surgeon: [name], MD;  Location: [place] [facility];  Service: Cardiovascular;  Laterality: N/A;  bilateral  . ABDOMINAL AORTOGRAM W/LOWER EXTREMITY N/A 04/30/2019   Procedure: ABDOMINAL AORTOGRAM W/LOWER EXTREMITY;  Surgeon: [name], MD;  Location: [place] [facility];  Service: Cardiovascular;  Laterality: N/A;  . PERIPHERAL VASCULAR INTERVENTION  02/06/2019   Procedure: PERIPHERAL VASCULAR INTERVENTION;  Surgeon: [name], MD;  Location: [place] [facility] LAB;  Service: Cardiovascular;;  Bilateral Iliacs  . PERIPHERAL VASCULAR INTERVENTION  04/30/2019   Procedure: PERIPHERAL VASCULAR INTERVENTION;  Surgeon: [name], MD;  Location: [place] [facility];  Service: Cardiovascular;;  Stent - Lt. Iliac     MEDICATIONS: . acetaminophen (TYLENOL) 500 MG tablet  . atorvastatin (LIPITOR) 80 MG tablet  . chlorthalidone (HYGROTON) 50 MG tablet  . clopidogrel (PLAVIX) 75 MG tablet  . diphenhydrAMINE (BENADRYL) 25 MG tablet  . empagliflozin (JARDIANCE) 10 MG TABS tablet  . gabapentin (NEURONTIN) 300 MG capsule  . glipiZIDE (GLUCOTROL) 5 MG tablet  . Insulin Glargine (BASAGLAR KWIKPEN) 100 UNIT/ML SOPN  . labetalol (NORMODYNE) 300 MG tablet  . nicotine (NICODERM CQ - DOSED IN MG/24 HOURS) 21 mg/24hr patch  . Omega-3 Fatty Acids (FISH OIL) 500 MG CAPS   No current facility-administered medications for this encounter.     Myra Gianotti, PA-C Surgical Short Stay/[facility] Phone [phone] [facility] Phone [phone] 05/28/2019 3:20 PM

## 2019-05-29 ENCOUNTER — Encounter (HOSPITAL_COMMUNITY): Payer: Self-pay | Admitting: Hematology

## 2019-05-29 ENCOUNTER — Telehealth (HOSPITAL_COMMUNITY): Payer: Self-pay

## 2019-05-29 ENCOUNTER — Other Ambulatory Visit: Payer: Self-pay

## 2019-05-29 ENCOUNTER — Inpatient Hospital Stay (HOSPITAL_COMMUNITY): Payer: Managed Care, Other (non HMO)

## 2019-05-29 ENCOUNTER — Inpatient Hospital Stay (HOSPITAL_COMMUNITY): Payer: Managed Care, Other (non HMO) | Attending: Hematology | Admitting: Hematology

## 2019-05-29 ENCOUNTER — Encounter (HOSPITAL_COMMUNITY): Payer: Self-pay

## 2019-05-29 VITALS — BP 176/83 | HR 99 | Temp 98.7°F | Resp 16 | Ht 73.0 in | Wt 215.3 lb

## 2019-05-29 DIAGNOSIS — Z79899 Other long term (current) drug therapy: Secondary | ICD-10-CM | POA: Diagnosis not present

## 2019-05-29 DIAGNOSIS — I10 Essential (primary) hypertension: Secondary | ICD-10-CM | POA: Insufficient documentation

## 2019-05-29 DIAGNOSIS — E1159 Type 2 diabetes mellitus with other circulatory complications: Secondary | ICD-10-CM | POA: Insufficient documentation

## 2019-05-29 DIAGNOSIS — E119 Type 2 diabetes mellitus without complications: Secondary | ICD-10-CM | POA: Diagnosis not present

## 2019-05-29 DIAGNOSIS — D696 Thrombocytopenia, unspecified: Secondary | ICD-10-CM

## 2019-05-29 DIAGNOSIS — R739 Hyperglycemia, unspecified: Secondary | ICD-10-CM | POA: Insufficient documentation

## 2019-05-29 DIAGNOSIS — Z803 Family history of malignant neoplasm of breast: Secondary | ICD-10-CM | POA: Diagnosis not present

## 2019-05-29 DIAGNOSIS — Z794 Long term (current) use of insulin: Secondary | ICD-10-CM | POA: Diagnosis not present

## 2019-05-29 DIAGNOSIS — D61818 Other pancytopenia: Secondary | ICD-10-CM | POA: Insufficient documentation

## 2019-05-29 DIAGNOSIS — I739 Peripheral vascular disease, unspecified: Secondary | ICD-10-CM | POA: Insufficient documentation

## 2019-05-29 DIAGNOSIS — N179 Acute kidney failure, unspecified: Secondary | ICD-10-CM | POA: Diagnosis not present

## 2019-05-29 DIAGNOSIS — E039 Hypothyroidism, unspecified: Secondary | ICD-10-CM | POA: Diagnosis not present

## 2019-05-29 DIAGNOSIS — Z87891 Personal history of nicotine dependence: Secondary | ICD-10-CM | POA: Diagnosis not present

## 2019-05-29 DIAGNOSIS — E1169 Type 2 diabetes mellitus with other specified complication: Secondary | ICD-10-CM | POA: Insufficient documentation

## 2019-05-29 DIAGNOSIS — R945 Abnormal results of liver function studies: Secondary | ICD-10-CM | POA: Diagnosis not present

## 2019-05-29 DIAGNOSIS — E870 Hyperosmolality and hypernatremia: Secondary | ICD-10-CM | POA: Insufficient documentation

## 2019-05-29 DIAGNOSIS — R7989 Other specified abnormal findings of blood chemistry: Secondary | ICD-10-CM

## 2019-05-29 DIAGNOSIS — E785 Hyperlipidemia, unspecified: Secondary | ICD-10-CM | POA: Insufficient documentation

## 2019-05-29 LAB — CBC WITH DIFFERENTIAL/PLATELET
Abs Immature Granulocytes: 0.01 10*3/uL (ref 0.00–0.07)
Basophils Absolute: 0.1 10*3/uL (ref 0.0–0.1)
Basophils Relative: 1 %
Eosinophils Absolute: 0.2 10*3/uL (ref 0.0–0.5)
Eosinophils Relative: 5 %
HCT: 41.1 % (ref 39.0–52.0)
Hemoglobin: 13.7 g/dL (ref 13.0–17.0)
Immature Granulocytes: 0 %
Lymphocytes Relative: 40 %
Lymphs Abs: 1.7 10*3/uL (ref 0.7–4.0)
MCH: 30.2 pg (ref 26.0–34.0)
MCHC: 33.3 g/dL (ref 30.0–36.0)
MCV: 90.5 fL (ref 80.0–100.0)
Monocytes Absolute: 0.7 10*3/uL (ref 0.1–1.0)
Monocytes Relative: 17 %
Neutro Abs: 1.6 10*3/uL — ABNORMAL LOW (ref 1.7–7.7)
Neutrophils Relative %: 37 %
Platelets: 53 10*3/uL — ABNORMAL LOW (ref 150–400)
RBC: 4.54 MIL/uL (ref 4.22–5.81)
RDW: 12.8 % (ref 11.5–15.5)
WBC: 4.2 10*3/uL (ref 4.0–10.5)
nRBC: 0 % (ref 0.0–0.2)

## 2019-05-29 LAB — FERRITIN: Ferritin: 570 ng/mL — ABNORMAL HIGH (ref 24–336)

## 2019-05-29 LAB — COMPREHENSIVE METABOLIC PANEL
ALT: 44 U/L (ref 0–44)
AST: 74 U/L — ABNORMAL HIGH (ref 15–41)
Albumin: 3 g/dL — ABNORMAL LOW (ref 3.5–5.0)
Alkaline Phosphatase: 85 U/L (ref 38–126)
Anion gap: 10 (ref 5–15)
BUN: 24 mg/dL — ABNORMAL HIGH (ref 6–20)
CO2: 25 mmol/L (ref 22–32)
Calcium: 8.8 mg/dL — ABNORMAL LOW (ref 8.9–10.3)
Chloride: 100 mmol/L (ref 98–111)
Creatinine, Ser: 1.08 mg/dL (ref 0.61–1.24)
GFR calc Af Amer: >60 mL/min (ref >60)
GFR calc non Af Amer: >60 mL/min (ref >60)
Glucose, Bld: 145 mg/dL — ABNORMAL HIGH (ref 70–99)
Potassium: 3.4 mmol/L — ABNORMAL LOW (ref 3.5–5.1)
Sodium: 135 mmol/L (ref 135–145)
Total Bilirubin: 1.3 mg/dL — ABNORMAL HIGH (ref 0.3–1.2)
Total Protein: 9.1 g/dL — ABNORMAL HIGH (ref 6.5–8.1)

## 2019-05-29 LAB — SEDIMENTATION RATE: Sed Rate: 85 mm/hr — ABNORMAL HIGH (ref 0–16)

## 2019-05-29 LAB — IRON AND TIBC
Iron: 170 ug/dL (ref 45–182)
Saturation Ratios: 51 % — ABNORMAL HIGH (ref 17.9–39.5)
TIBC: 332 ug/dL (ref 250–450)
UIBC: 162 ug/dL

## 2019-05-29 LAB — VITAMIN B12: Vitamin B-12: 605 pg/mL (ref 180–914)

## 2019-05-29 LAB — LACTATE DEHYDROGENASE: LDH: 277 U/L — ABNORMAL HIGH (ref 98–192)

## 2019-05-29 LAB — FOLATE: Folate: 23 ng/mL (ref >5.9)

## 2019-05-29 NOTE — Telephone Encounter (Signed)
Attempted reminder call/ COVID questions and one phone number had a voicemail that was not set up and there was no answer at the other number.

## 2019-05-29 NOTE — Patient Instructions (Signed)
[facility] at [facility] Discharge Instructions  You were seen today by Dr. Katragadda. He went over your history, family history and how you've been feeling lately. He will have blood drawn today. He will see you back in 3 weeks for labs and follow up.   Thank you for choosing [facility] at [facility] to provide your oncology and hematology care.  To afford each patient quality time with our provider, please arrive at least 15 minutes before your scheduled appointment time.   If you have a lab appointment with the Cancer Center please come in thru the  Main Entrance and check in at the main information desk  You need to re-schedule your appointment should you arrive 10 or more minutes late.  We strive to give you quality time with our providers, and arriving late affects you and other patients whose appointments are after yours.  Also, if you no show three or more times for appointments you may be dismissed from the clinic at the providers discretion.     Again, thank you for choosing [facility].  Our hope is that these requests will decrease the amount of time that you wait before being seen by our physicians.       _____________________________________________________________  Should you have questions after your visit to [facility], please contact our office at [phone] between the hours of 8:00 a.m. and 4:30 p.m.  Voicemails left after 4:00 p.m. will not be returned until the following business day.  For prescription refill requests, have your pharmacy contact our office and allow 72 hours.    Cancer Center Support Programs:   > Cancer Support Group  2nd Tuesday of the month 1pm-2pm, Journey Room  '  

## 2019-05-29 NOTE — Assessment & Plan Note (Signed)
1.  Moderate thrombocytopenia: - CBC on 05/27/2019 shows platelet count of 56 with normal white count and hemoglobin. -He had bilateral common iliac stent placement in April 2020 by Dr. Carlis Abbott and left external iliac stent on 04/30/2019.  As he is having resting pain, he was recommended to have left femoral to posterior tibial artery bypass. - He denies any history of easy bruising or bleeding.  He denies any prior knowledge of thrombocytopenia. -Denies any fevers, night sweats or weight loss in the last 6 months.  No prior history of blood transfusions. - He had a history of heavy alcohol use with sixpack of beer daily for more than 10 years, reportedly quit in February 2020. -His only new medication was gabapentin in April.  He does not take quinine supplements. -Recent blood work also showed elevated AST and ALT with decreased albumin indicating some degree of liver disease. - I will repeat his CBC and review his smear.  We will check LDH.  We will also check hepatitis serology, HIV, H. pylori and EBV serology. - We will rule out nutritional deficiencies by checking M25, folic acid, methylmalonic acid and copper levels.  We will also send ANA and rheumatoid factor. - Because of his elevated LFTs, we will also do CT scan of the abdomen with contrast to evaluate for any liver disease and splenomegaly.  2.  Smoking history: -Reportedly smoked half pack per day for 36 years.  Quit in January 2020.  3.  Diabetes: - Reportedly diagnosed in January 2020. - He is on Jardiance, glipizide and Engineer, agricultural.

## 2019-05-29 NOTE — Progress Notes (Signed)
CONSULT NOTE  Patient Care Team: Neale Burly, MD as PCP - General (Internal Medicine) [place] as Consulting Physician (General Practice)  CHIEF COMPLAINTS/PURPOSE OF CONSULTATION:  Moderate thrombocytopenia.  HISTORY OF PRESENTING ILLNESS:  Edward Rocha. 53 y.o. male is seen in consultation today for further work-up and management of low platelet count at the request of Dr. Carlis Abbott.  He has some vascular problems denies having resting pain in his lower extremities, left more than right.  He was recommended to have a left femoral to posterior tibial artery bypass.  Preoperative CBC showed a platelet count of 56 with normal white count and hemoglobin.  It also showed elevated liver function tests.  He denies any bleeding episodes or easy bruising.  No prior history of liver problems.  Denies any fevers, night sweats or weight loss in the last 6 months.  Denies any prior history of transfusions.  Denies taking any over-the-counter herbal supplements.  Newest medication was gabapentin in April.  He was reportedly diagnosed with diabetes earlier part of this year.  Denies taking any quinine supplements.  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  He smoked half pack per day for 36 years and quit in January 2020.  He used to drink at least 6 cans of beer every day for the last 10 to 15 years and quit on 12/02/2018.  Denies any history of IV drug abuse.  Family history significant for mother who died of metastatic breast cancer.  No family history of bleeding disorders.  MEDICAL HISTORY:  Past Medical History:  Diagnosis Date  . Acute renal failure (ARF) ([place])   . Bilateral pain of leg and foot   . Diabetes mellitus without complication ([place])    Type II  . Dizziness   . Frequent urination   . Hyperglycemia    Non-Ketotic Hyperosmolar.  . Hyperlipidemia   . Hypernatremia   . Hypertension   . Peripheral vascular disease ([place])   . Syncope   .  Thrombocytopenia ([place])   . Thyroid disease    Hypothyroidism  . Weakness     SURGICAL HISTORY: Past Surgical History:  Procedure Laterality Date  . ABDOMINAL AORTOGRAM W/LOWER EXTREMITY N/A 02/06/2019   Procedure: ABDOMINAL AORTOGRAM W/LOWER EXTREMITY;  Surgeon: [name], MD;  Location: [facility];  Service: Cardiovascular;  Laterality: N/A;  bilateral  . ABDOMINAL AORTOGRAM W/LOWER EXTREMITY N/A 04/30/2019   Procedure: ABDOMINAL AORTOGRAM W/LOWER EXTREMITY;  Surgeon: [name], MD;  Location: [facility];  Service: Cardiovascular;  Laterality: N/A;  . PERIPHERAL VASCULAR INTERVENTION  02/06/2019   Procedure: PERIPHERAL VASCULAR INTERVENTION;  Surgeon: [name], MD;  Location: [facility];  Service: Cardiovascular;;  Bilateral Iliacs  . PERIPHERAL VASCULAR INTERVENTION  04/30/2019   Procedure: PERIPHERAL VASCULAR INTERVENTION;  Surgeon: [name], MD;  Location: [facility];  Service: Cardiovascular;;  Stent - Lt. Iliac     SOCIAL HISTORY: Social History   Socioeconomic History  . Marital status: Married    Spouse name: Not on file  . Number of children: 2  . Years of education: Not on file  . Highest education level: Not on file  Occupational History  . Not on file  Social Needs  . Financial resource strain: Not on file  . Food insecurity    Worry: Not on file    Inability: Not on file  . Transportation needs  Medical: Not on file    Non-medical: Not on file  Tobacco Use  . Smoking status: Former Smoker    Packs/day: 1.00    Years: 36.00    Pack years: 36.00    Types: Cigarettes    Quit date: 01/25/2019    Years since quitting: 0.3  . Smokeless tobacco: Never Used  Substance and Sexual Activity  . Alcohol use: Not Currently    Frequency: Never    Comment: per pt's wife, pt is an alcoholic but just recently stopped  . Drug use: Never  . Sexual activity: Not on file  Lifestyle  . Physical activity     Days per week: Not on file    Minutes per session: Not on file  . Stress: Not on file  Relationships  . Social Herbalist on phone: Not on file    Gets together: Not on file    Attends religious service: Not on file    Active member of club or organization: Not on file    Attends meetings of clubs or organizations: Not on file    Relationship status: Not on file  . Intimate partner violence    Fear of current or ex partner: Not on file    Emotionally abused: Not on file    Physically abused: Not on file    Forced sexual activity: Not on file  Other Topics Concern  . Not on file  Social History Narrative  . Not on file    FAMILY HISTORY: Family History  Problem Relation Age of Onset  . Alcohol abuse Father   . Cancer Mother   . Alcohol abuse Brother   . Bipolar disorder Son   . Bipolar disorder Daughter     ALLERGIES:  has No Known Allergies.  MEDICATIONS:  Current Outpatient Medications  Medication Sig Dispense Refill  . Accu-Chek FastClix Lancets MISC TEST ONC EDAILY    . acetaminophen (TYLENOL) 500 MG tablet Take 1,000 mg by mouth daily as needed for moderate pain or headache.    Marland Kitchen atorvastatin (LIPITOR) 80 MG tablet Take 80 mg by mouth daily.     . chlorthalidone (HYGROTON) 50 MG tablet Take 50 mg by mouth daily.    . clopidogrel (PLAVIX) 75 MG tablet Take 75 mg by mouth daily.    . diphenhydrAMINE (BENADRYL) 25 MG tablet Take 25 mg by mouth daily as needed for itching.    . empagliflozin (JARDIANCE) 10 MG TABS tablet Take 10 mg by mouth daily.    Marland Kitchen gabapentin (NEURONTIN) 300 MG capsule Take 300 mg by mouth 3 (three) times daily.    Marland Kitchen glipiZIDE (GLUCOTROL) 5 MG tablet Take 5 mg by mouth 2 (two) times daily before a meal.     . Insulin Glargine (BASAGLAR KWIKPEN) 100 UNIT/ML SOPN Inject 20 Units into the skin every evening.    . labetalol (NORMODYNE) 300 MG tablet Take 300 mg by mouth 2 (two) times daily.    . nicotine (NICODERM CQ - DOSED IN MG/24  HOURS) 21 mg/24hr patch Place 21 mg onto the skin daily.    . Omega-3 Fatty Acids (FISH OIL) 500 MG CAPS Take 500 mg by mouth 2 (two) times a day.     No current facility-administered medications for this visit.     REVIEW OF SYSTEMS:   Constitutional: Denies fevers, chills or abnormal night sweats Eyes: Denies blurriness of vision, double vision or watery eyes Ears, nose, mouth, throat, and face: Denies mucositis  or sore throat Respiratory: Denies cough, dyspnea or wheezes Cardiovascular: Denies palpitation, chest discomfort or lower extremity swelling Gastrointestinal:  Denies nausea, heartburn or change in bowel habits Skin: Denies abnormal skin rashes Lymphatics: Denies new lymphadenopathy or easy bruising Neurological: Positive for burning sensation in the feet. Behavioral/Psych: Mood is stable, no new changes  All other systems were reviewed with the patient and are negative.  PHYSICAL EXAMINATION: ECOG PERFORMANCE STATUS: 0 - Asymptomatic  Vitals:   05/29/19 1300  BP: (!) 176/83  Pulse: 99  Resp: 16  Temp: 98.7 F (37.1 C)  SpO2: 100%   Filed Weights   05/29/19 1300  Weight: 215 lb 5 oz (97.7 kg)    GENERAL:alert, no distress and comfortable SKIN: skin color, texture, turgor are normal, no rashes or significant lesions EYES: normal, conjunctiva are pink and non-injected, sclera clear OROPHARYNX:no exudate, no erythema and lips, buccal mucosa, and tongue normal  NECK: supple, thyroid normal size, non-tender, without nodularity LYMPH:  no palpable lymphadenopathy in the cervical, axillary or inguinal LUNGS: clear to auscultation and percussion with normal breathing effort HEART: regular rate & rhythm and no murmurs and no lower extremity edema ABDOMEN:abdomen soft, non-tender and normal bowel sounds Musculoskeletal:no cyanosis of digits and no clubbing  PSYCH: alert & oriented x 3 with fluent speech NEURO: no focal motor/sensory deficits  LABORATORY DATA:  I  have reviewed the data as listed Recent Results (from the past 2160 hour(s))  SARS Coronavirus 2 (CEPHEID - Performed in [facility] hospital lab), Hosp Order     Status: None   Collection Time: 04/28/19  7:04 AM   Specimen: Nasopharyngeal Swab  Result Value Ref Range   SARS Coronavirus 2 NEGATIVE NEGATIVE    Comment: (NOTE) If result is NEGATIVE SARS-CoV-2 target nucleic acids are NOT DETECTED. The SARS-CoV-2 RNA is generally detectable in upper and lower  respiratory specimens during the acute phase of infection. The lowest  concentration of SARS-CoV-2 viral copies this assay can detect is 250  copies / mL. A negative result does not preclude SARS-CoV-2 infection  and should not be used as the sole basis for treatment or other  patient management decisions.  A negative result may occur with  improper specimen collection / handling, submission of specimen other  than nasopharyngeal swab, presence of viral mutation(s) within the  areas targeted by this assay, and inadequate number of viral copies  (<250 copies / mL). A negative result must be combined with clinical  observations, patient history, and epidemiological information. If result is POSITIVE SARS-CoV-2 target nucleic acids are DETECTED. The SARS-CoV-2 RNA is generally detectable in upper and lower  respiratory specimens dur ing the acute phase of infection.  Positive  results are indicative of active infection with SARS-CoV-2.  Clinical  correlation with patient history and other diagnostic information is  necessary to determine patient infection status.  Positive results do  not rule out bacterial infection or co-infection with other viruses. If result is PRESUMPTIVE POSTIVE SARS-CoV-2 nucleic acids MAY BE PRESENT.   A presumptive positive result was obtained on the submitted specimen  and confirmed on repeat testing.  While 2019 novel coronavirus  (SARS-CoV-2) nucleic acids may be present in the submitted sample   additional confirmatory testing may be necessary for epidemiological  and / or clinical management purposes  to differentiate between  SARS-CoV-2 and other Sarbecovirus currently known to infect humans.  If clinically indicated additional testing with an alternate test  methodology [phone]) is advised. The SARS-CoV-2 RNA  is generally  detectable in upper and lower respiratory sp ecimens during the acute  phase of infection. The expected result is Negative. Fact Sheet for Patients:  StrictlyIdeas.no Fact Sheet for Healthcare Providers: BankingDealers.co.za This test is not yet approved or cleared by the [place] FDA and has been authorized for detection and/or diagnosis of SARS-CoV-2 by FDA under an Emergency Use Authorization (EUA).  This EUA will remain in effect (meaning this test can be used) for the duration of the COVID-19 declaration under Section 564(b)(1) of the Act, 21 U.S.C. section 360bbb-3(b)(1), unless the authorization is terminated or revoked sooner. Performed at [facility], [address] [place], [place] [address]   I-STAT, [place] 8     Status: Abnormal   Collection Time: 04/30/19  6:53 AM  Result Value Ref Range   Sodium 141 135 - 145 mmol/L   Potassium 3.5 3.5 - 5.1 mmol/L   Chloride 105 98 - 111 mmol/L   BUN 18 6 - 20 mg/dL   Creatinine, Ser 1.20 0.61 - 1.24 mg/dL   Glucose, Bld 121 (H) 70 - 99 mg/dL   Calcium, Ion 1.17 1.15 - 1.40 mmol/L   TCO2 27 22 - 32 mmol/L   Hemoglobin 13.6 13.0 - 17.0 g/dL   HCT 40.0 39.0 - 52.0 %  Glucose, capillary     Status: None   Collection Time: 04/30/19 10:15 AM  Result Value Ref Range   Glucose-Capillary 95 70 - 99 mg/dL  Glucose, capillary     Status: Abnormal   Collection Time: 05/27/19 10:00 AM  Result Value Ref Range   Glucose-Capillary 106 (H) 70 - 99 mg/dL  Urinalysis, Routine w reflex microscopic     Status: Abnormal   Collection Time: 05/27/19 10:06 AM   Result Value Ref Range   Color, Urine YELLOW YELLOW   APPearance CLEAR CLEAR   Specific Gravity, Urine 1.015 1.005 - 1.030   pH 5.0 5.0 - 8.0   Glucose, UA >=500 (A) NEGATIVE mg/dL   Hgb urine dipstick SMALL (A) NEGATIVE   Bilirubin Urine NEGATIVE NEGATIVE   Ketones, ur NEGATIVE NEGATIVE mg/dL   Protein, ur NEGATIVE NEGATIVE mg/dL   Nitrite NEGATIVE NEGATIVE   Leukocytes,Ua NEGATIVE NEGATIVE   RBC / HPF 0-5 0 - 5 RBC/hpf   WBC, UA 0-5 0 - 5 WBC/hpf   Bacteria, UA NONE SEEN NONE SEEN   Squamous Epithelial / LPF 0-5 0 - 5    Comment: Performed at [facility], [place] [address], [place], [place] [address]  Type and screen     Status: None   Collection Time: 05/27/19 10:49 AM  Result Value Ref Range   ABO/RH(D) O POS    Antibody Screen NEG    Sample Expiration 06/10/2019,2359    Extend sample reason      NO TRANSFUSIONS OR PREGNANCY IN THE PAST 3 MONTHS Performed at [facility], [place] [address] [place], [place] [address]   ABO/Rh     Status: None   Collection Time: 05/27/19 10:49 AM  Result Value Ref Range   ABO/RH(D)      O POS Performed at [facility] [address] [place], [place] [address]   APTT     Status: None   Collection Time: 05/27/19 10:50 AM  Result Value Ref Range   aPTT 36 24 - 36 seconds    Comment: Performed at [facility] [address] [place], [place] [address]  CBC  Status: Abnormal   Collection Time: 05/27/19 10:50 AM  Result Value Ref Range   WBC 4.3 4.0 - 10.5 K/uL   RBC 4.74 4.22 - 5.81 MIL/uL   Hemoglobin 14.3 13.0 - 17.0 g/dL   HCT 43.4 39.0 - 52.0 %   MCV 91.6 80.0 - 100.0 fL   MCH 30.2 26.0 - 34.0 pg   MCHC 32.9 30.0 - 36.0 g/dL   RDW 12.8 11.5 - 15.5 %   Platelets 56 (L) 150 - 400 K/uL    Comment: REPEATED TO VERIFY PLATELET COUNT CONFIRMED BY SMEAR Immature Platelet Fraction may be clinically indicated, consider ordering this additional test [id number]    nRBC 0.0 0.0 - 0.2 %    Comment: Performed at  [facility], [place] [address] [place], [place] [address]  Comprehensive metabolic panel     Status: Abnormal   Collection Time: 05/27/19 10:50 AM  Result Value Ref Range   Sodium 136 135 - 145 mmol/L   Potassium 3.2 (L) 3.5 - 5.1 mmol/L   Chloride 101 98 - 111 mmol/L   CO2 22 22 - 32 mmol/L   Glucose, Bld 94 70 - 99 mg/dL   BUN 18 6 - 20 mg/dL   Creatinine, Ser 1.06 0.61 - 1.24 mg/dL   Calcium 9.5 8.9 - 10.3 mg/dL   Total Protein 9.5 (H) 6.5 - 8.1 g/dL   Albumin 2.9 (L) 3.5 - 5.0 g/dL   AST 67 (H) 15 - 41 U/L   ALT 45 (H) 0 - 44 U/L   Alkaline Phosphatase 80 38 - 126 U/L   Total Bilirubin 1.1 0.3 - 1.2 mg/dL   GFR calc non Af Amer >60 >60 mL/min   GFR calc Af Amer >60 >60 mL/min   Anion gap 13 5 - 15    Comment: Performed at [facility], [place] [address] [place], [place] [address]  Protime-INR     Status: Abnormal   Collection Time: 05/27/19 10:50 AM  Result Value Ref Range   Prothrombin Time 15.9 (H) 11.4 - 15.2 seconds   INR 1.3 (H) 0.8 - 1.2    Comment: (NOTE) INR goal varies based on device and disease states. Performed at [facility], [place] [address], [place], [place] [address]   Surgical pcr screen     Status: Abnormal   Collection Time: 05/27/19 10:50 AM   Specimen: Nasal Mucosa; Nasal Swab  Result Value Ref Range   MRSA, PCR NEGATIVE NEGATIVE   Staphylococcus aureus POSITIVE (A) NEGATIVE    Comment: (NOTE) The Xpert SA Assay (FDA approved for NASAL specimens in patients 103 years of age and older), is one component of a comprehensive surveillance program. It is not intended to diagnose infection nor to guide or monitor treatment. Performed at [facility], [place] [address], [place], [place] [address]   Hemoglobin A1c     Status: Abnormal   Collection Time: 05/27/19 10:50 AM  Result Value Ref Range   Hgb A1c MFr Bld 6.3 (H) 4.8 - 5.6 %    Comment: (NOTE) Pre diabetes:          5.7%-6.4% Diabetes:              >6.4% Glycemic control for    <7.0% adults with diabetes    Mean Plasma Glucose 134.11 mg/dL    Comment: Performed at [facility] [address] [place], [place] [address]    RADIOGRAPHIC STUDIES: I have personally  reviewed the radiological images as listed and agreed with the findings in the report. Vas [place] Burnard Bunting With/wo Tbi  Result Date: 05/20/2019 LOWER EXTREMITY DOPPLER STUDY Indications: Claudication, and peripheral artery disease. High Risk Factors: Hypertension, hyperlipidemia, Diabetes, current smoker. Other Factors: Findings:                Aortogram showed patent single renal arteries bilaterally. He has                a very calcified aortoiliac segment. The bilateral common iliac                arteries are stenotic with approximate 50% stenosis of the right                proximal common iliac and a greater than 80 to 90% stenosis of                the left common iliac. Both hypogastric arteries are patent                although they are severely diseased with calcified high-grade                stenosis proximally. Bilateral external iliacs are widely patent.                The right common femoral artery has a large calcific plaque just                before the takeoff of the SFA and the profunda likely creating a                flow limiting stenosis.                Left lower extremity runoff shows a patent common femoral,                profunda, and SFA. Patient has an occluded above and below-knee                popliteal artery as well as an occluded trifurcation and                reconstitutes what appears to be anterior tibial, posterior                tibial, and peroneal with runoff into the foot.                Right lower extremity shows a patent common femoral that has got                a large atherosclerotic plaque as previously noted with a patent                profunda and SFA. The above and below-knee popliteal artery are                patent but there is a large calcific plaque in the  below-knee                popliteal artery at the takeoff of the anterior tibial the                creates a greater than 90% high-grade stenosis. The peroneal                appears to be occluded proximally and reconstitutes. The                posterior tibial does appear to be  patent.  Vascular Interventions: 04/30/2019 Left EIA stent                          02/06/2019 Bilateral CIA angioplasty and stenting. Comparison Study: 04/22/2019 Performing Technologist: Caralee Ates BA, RVT, RDMS  Examination Guidelines: A complete evaluation includes at minimum, Doppler waveform signals and systolic blood pressure reading at the level of bilateral brachial, anterior tibial, and posterior tibial arteries, when vessel segments are accessible. Bilateral testing is considered an integral part of a complete examination. Photoelectric Plethysmograph (PPG) waveforms and toe systolic pressure readings are included as required and additional duplex testing as needed. Limited examinations for reoccurring indications may be performed as noted.  ABI Findings: +---------+------------------+-----+----------+----------------+ Right    Rt Pressure (mmHg)IndexWaveform  Comment          +---------+------------------+-----+----------+----------------+ Brachial 218                    triphasic                  +---------+------------------+-----+----------+----------------+ PTA      255               1.17 monophasicNon-compressible +---------+------------------+-----+----------+----------------+ DP       109               0.50 monophasic                 +---------+------------------+-----+----------+----------------+ Great Toe177               0.81 Normal                     +---------+------------------+-----+----------+----------------+ +---------+------------------+-----+----------+----------------+ Left     Lt Pressure (mmHg)IndexWaveform  Comment           +---------+------------------+-----+----------+----------------+ Brachial 188                    triphasic                  +---------+------------------+-----+----------+----------------+ PTA      255               1.17 monophasicNon-compressible +---------+------------------+-----+----------+----------------+ DP       255               1.17 monophasicNon-compressible +---------+------------------+-----+----------+----------------+ Great Toe121               0.56 Abnormal                   +---------+------------------+-----+----------+----------------+ +-------+-----------+-----------+------------+------------+ ABI/TBIToday's ABIToday's TBIPrevious ABIPrevious TBI +-------+-----------+-----------+------------+------------+ Right  [place]         0.81       [place]          0.60         +-------+-----------+-----------+------------+------------+ Left   [place]         0.56       [place]          0.50         +-------+-----------+-----------+------------+------------+ Bilateral ABIs appear essentially unchanged compared to prior study on 04/22/2019. Right TBIs appear increased compared to prior study on 04/22/2019. Left TBIs appear essentially unchanged compared to prior study on 04/22/2019.  Summary: Right: Resting right ankle-brachial index indicates noncompressible right lower extremity arteries.The right toe-brachial index is normal. RT great toe pressure = 177 mmHg. Left: Resting left ankle-brachial index indicates noncompressible left lower extremity arteries.The left toe-brachial index is abnormal. LT Great toe pressure =  121 mmHg.  *See table(s) above for measurements and observations.  Electronically signed by Monica Martinez MD on 05/20/2019 at 4:30:54 PM.    Final    Vas US Aorta/ivc/iliacs  Result Date: 05/20/2019 ABDOMINAL AORTA STUDY Indications: Surgery date 04/30/2019. Risk Factors: Hypertension, hyperlipidemia, Diabetes, current smoker. Vascular Interventions: 04/30/2019: Left  EIA stent                         02/06/2019: Bilateral CIA angioplasty and stenting. Limitations: Air/bowel gas and obesity.  Comparison Study: 6/23/020 Performing Technologist: Caralee Ates BA, RVT, RDMS  Examination Guidelines: A complete evaluation includes B-mode imaging, spectral Doppler, color Doppler, and power Doppler as needed of all accessible portions of each vessel. Bilateral testing is considered an integral part of a complete examination. Limited examinations for reoccurring indications may be performed as noted.  Abdominal Aorta Findings: Visualization of the Left CIA Mid artery and Left CIA Distal artery was limited.  Right Stent(s): +-------------------+--------+--------+----------+------------+ Common Iliac ArteryPSV cm/sStenosisWaveform  Comments     +-------------------+--------+--------+----------+------------+ Prox to Stent      40              monophasicDistal Aorta +-------------------+--------+--------+----------+------------+ Proximal Stent     89              monophasic             +-------------------+--------+--------+----------+------------+ Mid Stent          124             monophasic             +-------------------+--------+--------+----------+------------+ Distal Stent       103             monophasic             +-------------------+--------+--------+----------+------------+ Distal to Stent    88              monophasic             +-------------------+--------+--------+----------+------------+  Left Stent(s): +-------------------+--------+--------+----------+---------------------+ Common Iliac ArteryPSV cm/sStenosisWaveform  Comments              +-------------------+--------+--------+----------+---------------------+ Prox to Stent      40              monophasicDistal Aorta          +-------------------+--------+--------+----------+---------------------+ Proximal Stent     114             monophasic                       +-------------------+--------+--------+----------+---------------------+ Mid Stent                                    Obscured by bowel gas +-------------------+--------+--------+----------+---------------------+ Distal Stent                                 Obscured by bowel gas +-------------------+--------+--------+----------+---------------------+ Distal to Stent    88              monophasic                      +-------------------+--------+--------+----------+---------------------+ +--------------------------+--------+--------+----------+--------+ Left External Iliac ArteryPSV cm/sStenosisWaveform  Comments +--------------------------+--------+--------+----------+--------+ Proximal Stent            201  monophasic         +--------------------------+--------+--------+----------+--------+ Mid Stent                 150             monophasic         +--------------------------+--------+--------+----------+--------+ Distal Stent              114             monophasic         +--------------------------+--------+--------+----------+--------+ Distal to Stent           123             monophasic         +--------------------------+--------+--------+----------+--------+  Summary: Stenosis: Right CIA stent is patent with monophasic waveforms without evidence of restenosis. Visualized portions of left CIA stent is patent with monophasic waveforms without evidence of resteonsis. Left EIA stent is patent with monophasic waveforms without evidence of restenosis.  *See table(s) above for measurements and observations.  Electronically signed by Monica Martinez MD on 05/20/2019 at 12:41:30 PM.    Final     ASSESSMENT & PLAN:  Thrombocytopenia ([place]) 1.  Moderate thrombocytopenia: - CBC on 05/27/2019 shows platelet count of 56 with normal white count and hemoglobin. -He had bilateral common iliac stent placement in April 2020 by Dr. Carlis Abbott and left external  iliac stent on 04/30/2019.  As he is having resting pain, he was recommended to have left femoral to posterior tibial artery bypass. - He denies any history of easy bruising or bleeding.  He denies any prior knowledge of thrombocytopenia. -Denies any fevers, night sweats or weight loss in the last 6 months.  No prior history of blood transfusions. - He had a history of heavy alcohol use with sixpack of beer daily for more than 10 years, reportedly quit in February 2020. -His only new medication was gabapentin in April.  He does not take quinine supplements. -Recent blood work also showed elevated AST and ALT with decreased albumin indicating some degree of liver disease. - I will repeat his CBC and review his smear.  We will check LDH.  We will also check hepatitis serology, HIV, H. pylori and EBV serology. - We will rule out nutritional deficiencies by checking G83, folic acid, methylmalonic acid and copper levels.  We will also send ANA and rheumatoid factor. - Because of his elevated LFTs, we will also do CT scan of the abdomen with contrast to evaluate for any liver disease and splenomegaly.  2.  Smoking history: -Reportedly smoked half pack per day for 36 years.  Quit in January 2020.  3.  Diabetes: - Reportedly diagnosed in January 2020. - He is on Jardiance, glipizide and Engineer, agricultural.     All questions were answered. The patient knows to call the clinic with any problems, questions or concerns.      Derek Jack, MD 05/29/19 2:32 PM

## 2019-05-30 ENCOUNTER — Ambulatory Visit (INDEPENDENT_AMBULATORY_CARE_PROVIDER_SITE_OTHER)
Admission: RE | Admit: 2019-05-30 | Discharge: 2019-05-30 | Disposition: A | Discharge status: Home / Self Care | Payer: Managed Care, Other (non HMO) | Source: Ambulatory Visit | Attending: Family | Admitting: Family

## 2019-05-30 ENCOUNTER — Other Ambulatory Visit (HOSPITAL_COMMUNITY)
Admission: RE | Admit: 2019-05-30 | Discharge: 2019-05-30 | Disposition: A | Discharge status: Home / Self Care | Payer: Managed Care, Other (non HMO) | Source: Ambulatory Visit | Attending: Vascular Surgery | Admitting: Vascular Surgery

## 2019-05-30 ENCOUNTER — Encounter (HOSPITAL_COMMUNITY): Payer: Self-pay

## 2019-05-30 ENCOUNTER — Ambulatory Visit (HOSPITAL_COMMUNITY)
Admission: RE | Admit: 2019-05-30 | Discharge: 2019-05-30 | Disposition: A | Discharge status: Home / Self Care | Payer: Managed Care, Other (non HMO) | Source: Ambulatory Visit | Attending: Family | Admitting: Family

## 2019-05-30 DIAGNOSIS — I70229 Atherosclerosis of native arteries of extremities with rest pain, unspecified extremity: Secondary | ICD-10-CM

## 2019-05-30 DIAGNOSIS — I998 Other disorder of circulatory system: Secondary | ICD-10-CM | POA: Insufficient documentation

## 2019-05-30 LAB — HIV ANTIBODY (ROUTINE TESTING W REFLEX): HIV Screen 4th Generation wRfx: NONREACTIVE

## 2019-05-30 LAB — RHEUMATOID FACTOR: Rheumatoid fact SerPl-aCnc: 124.2 IU/mL — ABNORMAL HIGH (ref 0.0–13.9)

## 2019-05-30 LAB — ANTINUCLEAR ANTIBODIES, IFA: ANA Ab, IFA: NEGATIVE

## 2019-05-30 LAB — H PYLORI, IGM, IGG, IGA AB
H Pylori IgG: 2 Index Value — ABNORMAL HIGH (ref 0.00–0.79)
H. Pylogi, Iga Abs: 21.1 units — ABNORMAL HIGH (ref 0.0–8.9)
H. Pylogi, Igm Abs: <9 units (ref 0.0–8.9)

## 2019-05-30 LAB — HEPATITIS B SURFACE ANTIBODY,QUALITATIVE: Hep B S Ab: NONREACTIVE

## 2019-05-30 LAB — PATHOLOGIST SMEAR REVIEW

## 2019-05-30 LAB — HEPATITIS B SURFACE ANTIGEN: Hepatitis B Surface Ag: NEGATIVE

## 2019-05-30 LAB — HEPATITIS B CORE ANTIBODY, TOTAL: Hep B Core Total Ab: NEGATIVE

## 2019-05-31 DIAGNOSIS — R7989 Other specified abnormal findings of blood chemistry: Secondary | ICD-10-CM

## 2019-05-31 HISTORY — DX: Other specified abnormal findings of blood chemistry: R79.89

## 2019-05-31 LAB — COPPER, SERUM: Copper: 131 ug/dL (ref 72–166)

## 2019-05-31 LAB — METHYLMALONIC ACID, SERUM: Methylmalonic Acid, Quantitative: 258 nmol/L (ref 0–378)

## 2019-06-02 LAB — PROTEIN ELECTROPHORESIS, SERUM
A/G Ratio: 0.6 — ABNORMAL LOW (ref 0.7–1.7)
Albumin ELP: 3.2 g/dL (ref 2.9–4.4)
Alpha-1-Globulin: 0.2 g/dL (ref 0.0–0.4)
Alpha-2-Globulin: 0.6 g/dL (ref 0.4–1.0)
Beta Globulin: 1 g/dL (ref 0.7–1.3)
Gamma Globulin: 3.7 g/dL — ABNORMAL HIGH (ref 0.4–1.8)
Globulin, Total: 5.6 g/dL — ABNORMAL HIGH (ref 2.2–3.9)
Total Protein ELP: 8.8 g/dL — ABNORMAL HIGH (ref 6.0–8.5)

## 2019-06-03 ENCOUNTER — Encounter: Payer: Managed Care, Other (non HMO) | Admitting: Hematology

## 2019-06-12 ENCOUNTER — Other Ambulatory Visit (HOSPITAL_COMMUNITY): Payer: Self-pay | Admitting: Nurse Practitioner

## 2019-06-12 DIAGNOSIS — R7989 Other specified abnormal findings of blood chemistry: Secondary | ICD-10-CM

## 2019-06-16 ENCOUNTER — Ambulatory Visit (HOSPITAL_COMMUNITY): Payer: Managed Care, Other (non HMO)

## 2019-06-17 ENCOUNTER — Ambulatory Visit (HOSPITAL_COMMUNITY)
Admission: RE | Admit: 2019-06-17 | Discharge: 2019-06-17 | Disposition: A | Discharge status: Home / Self Care | Payer: Managed Care, Other (non HMO) | Source: Ambulatory Visit | Attending: Nurse Practitioner | Admitting: Nurse Practitioner

## 2019-06-17 ENCOUNTER — Other Ambulatory Visit: Payer: Self-pay

## 2019-06-17 ENCOUNTER — Other Ambulatory Visit (HOSPITAL_COMMUNITY)
Admission: RE | Admit: 2019-06-17 | Discharge: 2019-06-17 | Disposition: A | Discharge status: Home / Self Care | Payer: Managed Care, Other (non HMO) | Source: Ambulatory Visit | Attending: Hematology | Admitting: Hematology

## 2019-06-17 ENCOUNTER — Other Ambulatory Visit (HOSPITAL_COMMUNITY): Payer: Managed Care, Other (non HMO)

## 2019-06-17 DIAGNOSIS — R945 Abnormal results of liver function studies: Secondary | ICD-10-CM | POA: Insufficient documentation

## 2019-06-17 DIAGNOSIS — R7989 Other specified abnormal findings of blood chemistry: Secondary | ICD-10-CM

## 2019-06-18 ENCOUNTER — Other Ambulatory Visit: Payer: Self-pay

## 2019-06-18 ENCOUNTER — Other Ambulatory Visit: Payer: Self-pay | Admitting: *Deleted

## 2019-06-18 ENCOUNTER — Encounter (HOSPITAL_COMMUNITY): Payer: Self-pay | Admitting: Hematology

## 2019-06-18 ENCOUNTER — Inpatient Hospital Stay (HOSPITAL_COMMUNITY): Payer: Managed Care, Other (non HMO) | Attending: Hematology | Admitting: Hematology

## 2019-06-18 VITALS — BP 172/88 | HR 92 | Temp 98.2°F | Resp 18 | Wt 218.9 lb

## 2019-06-18 DIAGNOSIS — Z87891 Personal history of nicotine dependence: Secondary | ICD-10-CM | POA: Insufficient documentation

## 2019-06-18 DIAGNOSIS — K7460 Unspecified cirrhosis of liver: Secondary | ICD-10-CM | POA: Diagnosis not present

## 2019-06-18 DIAGNOSIS — D696 Thrombocytopenia, unspecified: Secondary | ICD-10-CM | POA: Insufficient documentation

## 2019-06-18 DIAGNOSIS — E119 Type 2 diabetes mellitus without complications: Secondary | ICD-10-CM | POA: Insufficient documentation

## 2019-06-18 MED ORDER — DEXAMETHASONE 4 MG PO TABS: 40.0000 mg | ORAL_TABLET | Freq: Every day | ORAL | 0 refills | Status: DC

## 2019-06-18 NOTE — Progress Notes (Signed)
[place], [place] [address]   CLINIC:  Medical Oncology/Hematology  PCP:  Neale Burly, MD [place] [id number] [phone]   REASON FOR VISIT:  Follow-up for moderate thrombocytopenia.   INTERVAL HISTORY:  Mr. Edward Rocha 53 y.o. male seen for follow-up of moderate thrombocytopenia.  He does report mild numbness and burning in the feet which is stable.  He has lower extremity pains on walking.  Denies any easy bruising or bleeding.  Denies any fevers, night sweats or weight loss in the last 6 months.  He denies any joint pains or swellings.  No recent hospitalizations or ER visits.    REVIEW OF SYSTEMS:  Review of Systems  Neurological: Positive for numbness.  All other systems reviewed and are negative.    PAST MEDICAL/SURGICAL HISTORY:  Past Medical History:  Diagnosis Date  . Acute renal failure (ARF) ([place])    12/2018 when admitted for hyperosmolar hyperglycemic nonketotic syndrome  . Bilateral pain of leg and foot   . Diabetes mellitus without complication ([place])    Type II  . Dizziness   . Frequent urination   . Hyperglycemia    Non-Ketotic Hyperosmolar.  . Hyperlipidemia   . Hypernatremia   . Hypertension   . Peripheral vascular disease ([place])   . Syncope    12/2018, diagnosed with hyperosmolar hyperglycemic nonketotic syndrome, AKI, hyponatremia ([facility])  . Thrombocytopenia ([place])   . Thyroid disease    Hypothyroidism  . Weakness    Past Surgical History:  Procedure Laterality Date  . ABDOMINAL AORTOGRAM W/LOWER EXTREMITY N/A 02/06/2019   Procedure: ABDOMINAL AORTOGRAM W/LOWER EXTREMITY;  Surgeon: [name], MD;  Location: [facility];  Service: Cardiovascular;  Laterality: N/A;  bilateral  . ABDOMINAL AORTOGRAM W/LOWER EXTREMITY N/A 04/30/2019   Procedure: ABDOMINAL AORTOGRAM W/LOWER EXTREMITY;  Surgeon: [name], MD;  Location: [facility];  Service:  Cardiovascular;  Laterality: N/A;  . PERIPHERAL VASCULAR INTERVENTION  02/06/2019   Procedure: PERIPHERAL VASCULAR INTERVENTION;  Surgeon: [name], MD;  Location: [facility];  Service: Cardiovascular;;  Bilateral Iliacs  . PERIPHERAL VASCULAR INTERVENTION  04/30/2019   Procedure: PERIPHERAL VASCULAR INTERVENTION;  Surgeon: [name], MD;  Location: [facility];  Service: Cardiovascular;;  Stent - Lt. Iliac      SOCIAL HISTORY:  Social History   Socioeconomic History  . Marital status: Married    Spouse name: Not on file  . Number of children: 2  . Years of education: Not on file  . Highest education level: Not on file  Occupational History  . Not on file  Social Needs  . Financial resource strain: Not on file  . Food insecurity    Worry: Not on file    Inability: Not on file  . Transportation needs    Medical: Not on file    Non-medical: Not on file  Tobacco Use  . Smoking status: Former Smoker    Packs/day: 1.00    Years: 36.00    Pack years: 36.00    Types: Cigarettes    Quit date: 01/25/2019    Years since quitting: 0.4  . Smokeless tobacco: Never Used  Substance and Sexual Activity  . Alcohol use: Not Currently    Frequency: Never    Comment: per pt's wife, pt is an alcoholic but just recently stopped  . Drug use: Never  . Sexual activity: Not on file  Lifestyle  . Physical activity    Days per week: Not on file    Minutes per session: Not on file  . Stress: Not on file  Relationships  . Social Herbalist on phone: Not on file    Gets together: Not on file    Attends religious service: Not on file    Active member of club or organization: Not on file    Attends meetings of clubs or organizations: Not on file    Relationship status: Not on file  . Intimate partner violence    Fear of current or ex partner: Not on file    Emotionally abused: Not on file    Physically abused: Not on file    Forced sexual activity:  Not on file  Other Topics Concern  . Not on file  Social History Narrative  . Not on file    FAMILY HISTORY:  Family History  Problem Relation Age of Onset  . Alcohol abuse Father   . Cancer Mother   . Alcohol abuse Brother   . Bipolar disorder Son   . Bipolar disorder Daughter     CURRENT MEDICATIONS:  Outpatient Encounter Medications as of 06/18/2019  Medication Sig  . Accu-Chek FastClix Lancets MISC TEST ONC EDAILY  . acetaminophen (TYLENOL) 500 MG tablet Take 1,000 mg by mouth daily as needed for moderate pain or headache.  Marland Kitchen atorvastatin (LIPITOR) 80 MG tablet Take 80 mg by mouth daily.   . chlorthalidone (HYGROTON) 50 MG tablet Take 50 mg by mouth daily.  . clopidogrel (PLAVIX) 75 MG tablet Take 75 mg by mouth daily.  . diphenhydrAMINE (BENADRYL) 25 MG tablet Take 25 mg by mouth daily as needed for itching.  . empagliflozin (JARDIANCE) 10 MG TABS tablet Take 10 mg by mouth daily.  Marland Kitchen gabapentin (NEURONTIN) 300 MG capsule Take 300 mg by mouth 3 (three) times daily.  Marland Kitchen glipiZIDE (GLUCOTROL) 5 MG tablet Take 5 mg by mouth 2 (two) times daily before a meal.   . Insulin Glargine (BASAGLAR KWIKPEN) 100 UNIT/ML SOPN Inject 20 Units into the skin every evening.  . labetalol (NORMODYNE) 300 MG tablet Take 300 mg by mouth 2 (two) times daily.  . mupirocin ointment (BACTROBAN) 2 % APPLY TO THE INSIDE OF EACH NOSTRIL BID FOR 5 DAYS  . nicotine (NICODERM CQ - DOSED IN MG/24 HOURS) 21 mg/24hr patch Place 21 mg onto the skin daily.  . Omega-3 Fatty Acids (FISH OIL) 500 MG CAPS Take 500 mg by mouth 2 (two) times a day.  Marland Kitchen dexamethasone (DECADRON) 4 MG tablet Take 10 tablets (40 mg total) by mouth daily.   No facility-administered encounter medications on file as of 06/18/2019.     ALLERGIES:  No Known Allergies   PHYSICAL EXAM:  ECOG Performance status: 0  Vitals:   06/18/19 1550  BP: (!) 172/88  Pulse: 92  Resp: 18  Temp: 98.2 F (36.8 C)  SpO2: 100%   Filed Weights    06/18/19 1550  Weight: 218 lb 14.4 oz (99.3 kg)    Physical Exam Vitals signs reviewed.  Constitutional:      Appearance: Normal appearance.  Cardiovascular:     Rate and Rhythm: Normal rate and regular rhythm.     Heart sounds: Normal heart sounds.  Pulmonary:     Effort: Pulmonary effort is normal.     Breath sounds: Normal breath sounds.  Abdominal:     General: There is no distension.  Palpations: Abdomen is soft. There is no mass.  Musculoskeletal:        General: No swelling.  Lymphadenopathy:     Cervical: No cervical adenopathy.  Skin:    General: Skin is warm.  Neurological:     General: No focal deficit present.     Mental Status: He is alert and oriented to person, place, and time.  Psychiatric:        Mood and Affect: Mood normal.        Behavior: Behavior normal.      LABORATORY DATA:  I have reviewed the labs as listed.  CBC    Component Value Date/Time   WBC 4.2 05/29/2019 1443   RBC 4.54 05/29/2019 1443   HGB 13.7 05/29/2019 1443   HCT 41.1 05/29/2019 1443   PLT 53 (L) 05/29/2019 1443   MCV 90.5 05/29/2019 1443   MCH 30.2 05/29/2019 1443   MCHC 33.3 05/29/2019 1443   RDW 12.8 05/29/2019 1443   LYMPHSABS 1.7 05/29/2019 1443   MONOABS 0.7 05/29/2019 1443   EOSABS 0.2 05/29/2019 1443   BASOSABS 0.1 05/29/2019 1443   CMP Latest Ref Rng & Units 05/29/2019 05/27/2019 04/30/2019  Glucose 70 - 99 mg/dL 145(H) 94 121(H)  BUN 6 - 20 mg/dL 24(H) 18 18  Creatinine 0.61 - 1.24 mg/dL 1.08 1.06 1.20  Sodium 135 - 145 mmol/L 135 136 141  Potassium 3.5 - 5.1 mmol/L 3.4(L) 3.2(L) 3.5  Chloride 98 - 111 mmol/L 100 101 105  CO2 22 - 32 mmol/L 25 22 -  Calcium 8.9 - 10.3 mg/dL 8.8(L) 9.5 -  Total Protein 6.5 - 8.1 g/dL 9.1(H) 9.5(H) -  Total Bilirubin 0.3 - 1.2 mg/dL 1.3(H) 1.1 -  Alkaline Phos 38 - 126 U/L 85 80 -  AST 15 - 41 U/L 74(H) 67(H) -  ALT 0 - 44 U/L 44 45(H) -       DIAGNOSTIC IMAGING:  I have independently reviewed the scans and discussed  with the patient.   I have reviewed Venita Lick LPN's note and agree with the documentation.  I personally performed a face-to-face visit, made revisions and my assessment and plan is as follows.    ASSESSMENT & PLAN:   Thrombocytopenia ([place]) 1.  Moderate thrombocytopenia: - Referred to [place] by Dr. Carlis Abbott, prior to recommended left femoral to posterior tibial artery bypass. - Repeat platelet count was 53.  No bleeding issues.  No fevers, night sweats or weight loss in the last 6 months.  He had a history of heavy alcohol use in the past.  Reportedly quit in February 2020. -Nutritional deficiencies were ruled out.  Rheumatoid factor was elevated at 124.  He does not have any joint pains. -Ultrasound of the liver on 05/30/2019 shows mild changes consistent with early cirrhosis.  Spleen was normal in size. - Differential diagnosis includes immune mediated thrombocytopenia.  I have recommended giving a trial of pulse dexamethasone 40 mg for 4 days. -We talked about side effects.  I will reevaluate his platelet count after 4 days of dexamethasone.  2.  Smoking history: - Smoked half pack per day for 36 years.  Quit in January 2020.  3.  Diabetes: -Diagnosed in January 2020.  He is on Jardiance, glipizide and Basaglar.  Total time spent is 25 minutes with more than 50% of the time spent face-to-face discussing scan results, management, counseling and coordination of care.    Orders placed this encounter:  Orders Placed This Encounter  Procedures  .  CBC with Differential      Derek Jack, MD [place] [phone]

## 2019-06-18 NOTE — Patient Instructions (Signed)
[place] at [facility] Discharge Instructions  You were seen today by Dr. Delton Coombes. He went over your recent lab and ultra sound results. He will see you back in 1 week for labs and follow up.   Thank you for choosing [place] at [facility] to provide your oncology and hematology care.  To afford each patient quality time with our provider, please arrive at least 15 minutes before your scheduled appointment time.   If you have a lab appointment with the [facility] please come in thru the  Main Entrance and check in at the main information desk  You need to re-schedule your appointment should you arrive 10 or more minutes late.  We strive to give you quality time with our providers, and arriving late affects you and other patients whose appointments are after yours.  Also, if you no show three or more times for appointments you may be dismissed from the clinic at the providers discretion.     Again, thank you for choosing [facility].  Our hope is that these requests will decrease the amount of time that you wait before being seen by our physicians.       _____________________________________________________________  Should you have questions after your visit to [facility], please contact our office at [phone] between the hours of 8:00 a.m. and 4:30 p.m.  Voicemails left after 4:00 p.m. will not be returned until the following business day.  For prescription refill requests, have your pharmacy contact our office and allow 72 hours.    Cancer Center Support Programs:   > Cancer Support Group  2nd Tuesday of the month 1pm-2pm, Journey Room

## 2019-06-19 ENCOUNTER — Inpatient Hospital Stay (HOSPITAL_COMMUNITY): Admission: RE | Admit: 2019-06-19 | Payer: Managed Care, Other (non HMO) | Source: Ambulatory Visit

## 2019-06-22 NOTE — Assessment & Plan Note (Signed)
1.  Moderate thrombocytopenia: - Referred to [place] by Dr. Carlis Abbott, prior to recommended left femoral to posterior tibial artery bypass. - Repeat platelet count was 53.  No bleeding issues.  No fevers, night sweats or weight loss in the last 6 months.  He had a history of heavy alcohol use in the past.  Reportedly quit in February 2020. -Nutritional deficiencies were ruled out.  Rheumatoid factor was elevated at 124.  He does not have any joint pains. -Ultrasound of the liver on 05/30/2019 shows mild changes consistent with early cirrhosis.  Spleen was normal in size. - Differential diagnosis includes immune mediated thrombocytopenia.  I have recommended giving a trial of pulse dexamethasone 40 mg for 4 days. -We talked about side effects.  I will reevaluate his platelet count after 4 days of dexamethasone.  2.  Smoking history: - Smoked half pack per day for 36 years.  Quit in January 2020.  3.  Diabetes: -Diagnosed in January 2020.  He is on Jardiance, glipizide and Basaglar.

## 2019-06-24 ENCOUNTER — Inpatient Hospital Stay (HOSPITAL_BASED_OUTPATIENT_CLINIC_OR_DEPARTMENT_OTHER): Payer: Managed Care, Other (non HMO) | Admitting: Hematology

## 2019-06-24 ENCOUNTER — Other Ambulatory Visit: Payer: Self-pay

## 2019-06-24 ENCOUNTER — Inpatient Hospital Stay (HOSPITAL_COMMUNITY): Payer: Managed Care, Other (non HMO)

## 2019-06-24 ENCOUNTER — Encounter (HOSPITAL_COMMUNITY): Payer: Self-pay | Admitting: Hematology

## 2019-06-24 VITALS — BP 160/78 | HR 91 | Temp 98.2°F | Resp 18 | Wt 209.3 lb

## 2019-06-24 DIAGNOSIS — D696 Thrombocytopenia, unspecified: Secondary | ICD-10-CM

## 2019-06-24 LAB — CBC WITH DIFFERENTIAL/PLATELET
Abs Immature Granulocytes: 0.05 10*3/uL (ref 0.00–0.07)
Basophils Absolute: 0 10*3/uL (ref 0.0–0.1)
Basophils Relative: 0 %
Eosinophils Absolute: 0 10*3/uL (ref 0.0–0.5)
Eosinophils Relative: 0 %
HCT: 40.2 % (ref 39.0–52.0)
Hemoglobin: 13.7 g/dL (ref 13.0–17.0)
Immature Granulocytes: 0 %
Lymphocytes Relative: 8 %
Lymphs Abs: 0.9 10*3/uL (ref 0.7–4.0)
MCH: 29.5 pg (ref 26.0–34.0)
MCHC: 34.1 g/dL (ref 30.0–36.0)
MCV: 86.5 fL (ref 80.0–100.0)
Monocytes Absolute: 1 10*3/uL (ref 0.1–1.0)
Monocytes Relative: 8 %
Neutro Abs: 9.6 10*3/uL — ABNORMAL HIGH (ref 1.7–7.7)
Neutrophils Relative %: 84 %
Platelets: 48 10*3/uL — ABNORMAL LOW (ref 150–400)
RBC: 4.65 MIL/uL (ref 4.22–5.81)
RDW: 12.3 % (ref 11.5–15.5)
WBC: 11.6 10*3/uL — ABNORMAL HIGH (ref 4.0–10.5)
nRBC: 0 % (ref 0.0–0.2)

## 2019-06-24 NOTE — Progress Notes (Signed)
Edward Rocha, [place] [address]   CLINIC:  Medical Oncology/Hematology  PCP:  Neale Burly, MD [place] [id number] [phone]   REASON FOR VISIT:  Follow-up for thrombocytopenia.   INTERVAL HISTORY:  Edward Rocha 53 y.o. male seen for follow-up of thrombocytopenia.  Denies any nosebleeds or easy bruising.  He was treated with dexamethasone 40 mg for 4 days starting 06/22/2019.  Dexamethasone made him not sleep well for those nights.  Appetite is 100%.  Energy levels are 50%.  Pain in the feet, rated a 7 out of 10.  No fevers or infections noted.    REVIEW OF SYSTEMS:  Review of Systems  Neurological: Positive for numbness.  All other systems reviewed and are negative.    PAST MEDICAL/SURGICAL HISTORY:  Past Medical History:  Diagnosis Date   Acute renal failure (ARF) ([place])    12/2018 when admitted for hyperosmolar hyperglycemic nonketotic syndrome   Bilateral pain of leg and foot    Diabetes mellitus without complication ([facility])    Type II   Dizziness    Frequent urination    Hyperglycemia    Non-Ketotic Hyperosmolar.   Hyperlipidemia    Hypernatremia    Hypertension    Peripheral vascular disease ([place])    Syncope    12/2018, diagnosed with hyperosmolar hyperglycemic nonketotic syndrome, AKI, hyponatremia ([facility])   Thrombocytopenia ([facility])    Thyroid disease    Hypothyroidism   Weakness    Past Surgical History:  Procedure Laterality Date   ABDOMINAL AORTOGRAM W/LOWER EXTREMITY N/A 02/06/2019   Procedure: ABDOMINAL AORTOGRAM W/LOWER EXTREMITY;  Surgeon: [name], MD;  Location: [facility];  Service: Cardiovascular;  Laterality: N/A;  bilateral   ABDOMINAL AORTOGRAM W/LOWER EXTREMITY N/A 04/30/2019   Procedure: ABDOMINAL AORTOGRAM W/LOWER EXTREMITY;  Surgeon: [name], MD;  Location: [facility];  Service: Cardiovascular;  Laterality: N/A;   PERIPHERAL  VASCULAR INTERVENTION  02/06/2019   Procedure: PERIPHERAL VASCULAR INTERVENTION;  Surgeon: [name], MD;  Location: [facility];  Service: Cardiovascular;;  Bilateral Iliacs   PERIPHERAL VASCULAR INTERVENTION  04/30/2019   Procedure: PERIPHERAL VASCULAR INTERVENTION;  Surgeon: [name], MD;  Location: [facility];  Service: Cardiovascular;;  Stent - Lt. Iliac      SOCIAL HISTORY:  Social History   Socioeconomic History   Marital status: Married    Spouse name: Not on file   Number of children: 2   Years of education: Not on file   Highest education level: Not on file  Occupational History   Not on file  Social Needs   Financial resource strain: Not on file   Food insecurity    Worry: Not on file    Inability: Not on file   Transportation needs    Medical: Not on file    Non-medical: Not on file  Tobacco Use   Smoking status: Former Smoker    Packs/day: 1.00    Years: 36.00    Pack years: 36.00    Types: Cigarettes    Quit date: 01/25/2019    Years since quitting: 0.4   Smokeless tobacco: Never Used  Substance and Sexual Activity   Alcohol use: Not Currently    Frequency: Never    Comment: per pt's wife, pt is an alcoholic but just recently stopped   Drug use: Never   Sexual activity: Not on file  Lifestyle  Physical activity    Days per week: Not on file    Minutes per session: Not on file   Stress: Not on file  Relationships   Social connections    Talks on phone: Not on file    Gets together: Not on file    Attends religious service: Not on file    Active member of club or organization: Not on file    Attends meetings of clubs or organizations: Not on file    Relationship status: Not on file   Intimate partner violence    Fear of current or ex partner: Not on file    Emotionally abused: Not on file    Physically abused: Not on file    Forced sexual activity: Not on file  Other Topics Concern   Not on  file  Social History Narrative   Not on file    FAMILY HISTORY:  Family History  Problem Relation Age of Onset   Alcohol abuse Father    Cancer Mother    Alcohol abuse Brother    Bipolar disorder Son    Bipolar disorder Daughter     CURRENT MEDICATIONS:  Outpatient Encounter Medications as of 06/24/2019  Medication Sig   atorvastatin (LIPITOR) 80 MG tablet Take 80 mg by mouth daily.    chlorthalidone (HYGROTON) 50 MG tablet Take 50 mg by mouth daily.   clopidogrel (PLAVIX) 75 MG tablet Take 75 mg by mouth daily.   empagliflozin (JARDIANCE) 10 MG TABS tablet Take 10 mg by mouth daily.   gabapentin (NEURONTIN) 300 MG capsule Take 300 mg by mouth 3 (three) times daily.   glipiZIDE (GLUCOTROL) 5 MG tablet Take 5 mg by mouth 2 (two) times daily before a meal.    Insulin Glargine (BASAGLAR KWIKPEN) 100 UNIT/ML SOPN Inject 20 Units into the skin every evening.   labetalol (NORMODYNE) 300 MG tablet Take 300 mg by mouth 2 (two) times daily.   mupirocin ointment (BACTROBAN) 2 % APPLY TO THE INSIDE OF EACH NOSTRIL BID FOR 5 DAYS   nicotine (NICODERM CQ - DOSED IN MG/24 HOURS) 21 mg/24hr patch Place 21 mg onto the skin daily.   NIFEdipine (PROCARDIA XL/NIFEDICAL-XL) 90 MG 24 hr tablet TK 1 T PO QD   Accu-Chek FastClix Lancets MISC TEST ONC EDAILY   acetaminophen (TYLENOL) 500 MG tablet Take 1,000 mg by mouth daily as needed for moderate pain or headache.   diphenhydrAMINE (BENADRYL) 25 MG tablet Take 25 mg by mouth daily as needed for itching.   [DISCONTINUED] dexamethasone (DECADRON) 4 MG tablet Take 10 tablets (40 mg total) by mouth daily.   [DISCONTINUED] gabapentin (NEURONTIN) 600 MG tablet    [DISCONTINUED] Omega-3 Fatty Acids (FISH OIL) 500 MG CAPS Take 500 mg by mouth 2 (two) times a day.   No facility-administered encounter medications on file as of 06/24/2019.     ALLERGIES:  No Known Allergies   PHYSICAL EXAM:  ECOG Performance status: 0  Vitals:    06/24/19 1413  BP: (!) 160/78  Pulse: 91  Resp: 18  Temp: 98.2 F (36.8 C)  SpO2: 95%   Filed Weights   06/24/19 1413  Weight: 209 lb 5 oz (94.9 kg)    Physical Exam Vitals signs reviewed.  Constitutional:      Appearance: Normal appearance.  Cardiovascular:     Rate and Rhythm: Normal rate and regular rhythm.     Heart sounds: Normal heart sounds.  Pulmonary:     Effort: Pulmonary effort is  normal.     Breath sounds: Normal breath sounds.  Abdominal:     General: There is no distension.     Palpations: Abdomen is soft. There is no mass.  Musculoskeletal:        General: No swelling.  Lymphadenopathy:     Cervical: No cervical adenopathy.  Skin:    General: Skin is warm.  Neurological:     General: No focal deficit present.     Mental Status: He is alert and oriented to person, place, and time.  Psychiatric:        Mood and Affect: Mood normal.        Behavior: Behavior normal.      LABORATORY DATA:  I have reviewed the labs as listed.  CBC    Component Value Date/Time   WBC 11.6 (H) 06/24/2019 1347   RBC 4.65 06/24/2019 1347   HGB 13.7 06/24/2019 1347   HCT 40.2 06/24/2019 1347   PLT 48 (L) 06/24/2019 1347   MCV 86.5 06/24/2019 1347   MCH 29.5 06/24/2019 1347   MCHC 34.1 06/24/2019 1347   RDW 12.3 06/24/2019 1347   LYMPHSABS 0.9 06/24/2019 1347   MONOABS 1.0 06/24/2019 1347   EOSABS 0.0 06/24/2019 1347   BASOSABS 0.0 06/24/2019 1347   CMP Latest Ref Rng & Units 05/29/2019 05/27/2019 04/30/2019  Glucose 70 - 99 mg/dL 145(H) 94 121(H)  BUN 6 - 20 mg/dL 24(H) 18 18  Creatinine 0.61 - 1.24 mg/dL 1.08 1.06 1.20  Sodium 135 - 145 mmol/L 135 136 141  Potassium 3.5 - 5.1 mmol/L 3.4(L) 3.2(L) 3.5  Chloride 98 - 111 mmol/L 100 101 105  CO2 22 - 32 mmol/L 25 22 -  Calcium 8.9 - 10.3 mg/dL 8.8(L) 9.5 -  Total Protein 6.5 - 8.1 g/dL 9.1(H) 9.5(H) -  Total Bilirubin 0.3 - 1.2 mg/dL 1.3(H) 1.1 -  Alkaline Phos 38 - 126 U/L 85 80 -  AST 15 - 41 U/L 74(H) 67(H) -    ALT 0 - 44 U/L 44 45(H) -       DIAGNOSTIC IMAGING:  I have independently reviewed the scans and discussed with the patient.   I have reviewed Venita Lick LPN's note and agree with the documentation.  I personally performed a face-to-face visit, made revisions and my assessment and plan is as follows.    ASSESSMENT & PLAN:   Thrombocytopenia ([place]) 1.  Moderate thrombocytopenia: -Referred to [place] by Dr. Carlis Abbott, prior to recommended left femoral to posterior tibial artery bypass. - Platelet count was 53.  No bleeding issues.  No B symptoms. She had history of heavy alcohol use in the past.  Reportedly quit in February 2020. -Nutritional deficiencies were ruled out.  Rheumatoid factor was elevated at 124.  He does not have any clinical signs or symptoms. - Ultrasound of the liver on 05/30/2019 shows mild changes consistent with early cirrhosis.  Spleen size was normal. - As a differential diagnosis included immune thrombocytopenia, we have given a trial of dexamethasone on 06/22/2019 for 4 days.  He tolerated it well. -We repeated his platelet count today which was stable at 48.  He did not get any response with steroids. - He is reportedly having surgery done this Thursday.  I would recommend giving 1 unit of platelets if there is any bleeding or oozing more than anticipated during or after surgery. -We will see him back in 4 weeks for follow-up and repeat platelets.  2.  Smoking history: -Smoked half pack  per day for 36 years.  Quit in January 2020.  3.  Diabetes: -Diagnosed in January 2020.  He is on Jardiance, glipizide and Basaglar.  Total time spent is 25 minutes with more than 50% of the time spent face-to-face discussing scan results, management, counseling and coordination of care.    Orders placed this encounter:  Orders Placed This Encounter  Procedures   CBC with Differential      Derek Jack, MD [place] [phone]

## 2019-06-24 NOTE — Patient Instructions (Addendum)
[facility] at [facility] Discharge Instructions  You were seen today by Dr. Katragadda. He went over your recent lab results. He will see you back in 1 month for labs and follow up.   Thank you for choosing [facility] at [facility] to provide your oncology and hematology care.  To afford each patient quality time with our provider, please arrive at least 15 minutes before your scheduled appointment time.   If you have a lab appointment with the Cancer Center please come in thru the  Main Entrance and check in at the main information desk  You need to re-schedule your appointment should you arrive 10 or more minutes late.  We strive to give you quality time with our providers, and arriving late affects you and other patients whose appointments are after yours.  Also, if you no show three or more times for appointments you may be dismissed from the clinic at the providers discretion.     Again, thank you for choosing [facility].  Our hope is that these requests will decrease the amount of time that you wait before being seen by our physicians.       _____________________________________________________________  Should you have questions after your visit to [facility], please contact our office at [phone] between the hours of 8:00 a.m. and 4:30 p.m.  Voicemails left after 4:00 p.m. will not be returned until the following business day.  For prescription refill requests, have your pharmacy contact our office and allow 72 hours.    Cancer Center Support Programs:   > Cancer Support Group  2nd Tuesday of the month 1pm-2pm, Journey Room    

## 2019-06-24 NOTE — Assessment & Plan Note (Signed)
1.  Moderate thrombocytopenia: -Referred to [place] by Dr. Carlis Abbott, prior to recommended left femoral to posterior tibial artery bypass. - Platelet count was 53.  No bleeding issues.  No B symptoms. She had history of heavy alcohol use in the past.  Reportedly quit in February 2020. -Nutritional deficiencies were ruled out.  Rheumatoid factor was elevated at 124.  He does not have any clinical signs or symptoms. - Ultrasound of the liver on 05/30/2019 shows mild changes consistent with early cirrhosis.  Spleen size was normal. - As a differential diagnosis included immune thrombocytopenia, we have given a trial of dexamethasone on 06/22/2019 for 4 days.  He tolerated it well. -We repeated his platelet count today which was stable at 48.  He did not get any response with steroids. - He is reportedly having surgery done this Thursday.  I would recommend giving 1 unit of platelets if there is any bleeding or oozing more than anticipated during or after surgery. -We will see him back in 4 weeks for follow-up and repeat platelets.  2.  Smoking history: -Smoked half pack per day for 36 years.  Quit in January 2020.  3.  Diabetes: -Diagnosed in January 2020.  He is on Jardiance, glipizide and Basaglar.

## 2019-07-01 ENCOUNTER — Other Ambulatory Visit: Payer: Self-pay | Admitting: *Deleted

## 2019-07-06 ENCOUNTER — Other Ambulatory Visit (HOSPITAL_COMMUNITY): Payer: Self-pay | Admitting: *Deleted

## 2019-07-08 ENCOUNTER — Other Ambulatory Visit: Payer: Self-pay

## 2019-07-08 ENCOUNTER — Other Ambulatory Visit (HOSPITAL_COMMUNITY)
Admission: RE | Admit: 2019-07-08 | Discharge: 2019-07-08 | Disposition: A | Discharge status: Home / Self Care | Payer: Managed Care, Other (non HMO) | Source: Ambulatory Visit | Attending: Vascular Surgery | Admitting: Vascular Surgery

## 2019-07-08 DIAGNOSIS — Z01812 Encounter for preprocedural laboratory examination: Secondary | ICD-10-CM | POA: Diagnosis present

## 2019-07-08 DIAGNOSIS — Z20828 Contact with and (suspected) exposure to other viral communicable diseases: Secondary | ICD-10-CM | POA: Insufficient documentation

## 2019-07-09 ENCOUNTER — Encounter (HOSPITAL_COMMUNITY)
Admission: RE | Admit: 2019-07-09 | Discharge: 2019-07-09 | Disposition: A | Discharge status: Home / Self Care | Payer: Managed Care, Other (non HMO) | Source: Ambulatory Visit | Attending: Vascular Surgery | Admitting: Vascular Surgery

## 2019-07-09 ENCOUNTER — Other Ambulatory Visit: Payer: Self-pay

## 2019-07-09 ENCOUNTER — Encounter (HOSPITAL_COMMUNITY): Payer: Self-pay

## 2019-07-09 DIAGNOSIS — Z01812 Encounter for preprocedural laboratory examination: Secondary | ICD-10-CM | POA: Insufficient documentation

## 2019-07-09 LAB — COMPREHENSIVE METABOLIC PANEL
ALT: 47 U/L — ABNORMAL HIGH (ref 0–44)
AST: 52 U/L — ABNORMAL HIGH (ref 15–41)
Albumin: 2.9 g/dL — ABNORMAL LOW (ref 3.5–5.0)
Alkaline Phosphatase: 93 U/L (ref 38–126)
Anion gap: 8 (ref 5–15)
BUN: 12 mg/dL (ref 6–20)
CO2: 25 mmol/L (ref 22–32)
Calcium: 9.4 mg/dL (ref 8.9–10.3)
Chloride: 103 mmol/L (ref 98–111)
Creatinine, Ser: 0.96 mg/dL (ref 0.61–1.24)
GFR calc Af Amer: >60 mL/min (ref >60)
GFR calc non Af Amer: >60 mL/min (ref >60)
Glucose, Bld: 298 mg/dL — ABNORMAL HIGH (ref 70–99)
Potassium: 3.6 mmol/L (ref 3.5–5.1)
Sodium: 136 mmol/L (ref 135–145)
Total Bilirubin: 0.8 mg/dL (ref 0.3–1.2)
Total Protein: 8.2 g/dL — ABNORMAL HIGH (ref 6.5–8.1)

## 2019-07-09 LAB — URINALYSIS, ROUTINE W REFLEX MICROSCOPIC
Bacteria, UA: NONE SEEN
Bilirubin Urine: NEGATIVE
Glucose, UA: >=500 mg/dL — AB
Hgb urine dipstick: NEGATIVE
Ketones, ur: NEGATIVE mg/dL
Leukocytes,Ua: NEGATIVE
Nitrite: NEGATIVE
Protein, ur: NEGATIVE mg/dL
Specific Gravity, Urine: 1.023 (ref 1.005–1.030)
pH: 7 (ref 5.0–8.0)

## 2019-07-09 LAB — CBC WITH DIFFERENTIAL/PLATELET
Abs Immature Granulocytes: 0.01 10*3/uL (ref 0.00–0.07)
Basophils Absolute: 0 10*3/uL (ref 0.0–0.1)
Basophils Relative: 1 %
Eosinophils Absolute: 0.2 10*3/uL (ref 0.0–0.5)
Eosinophils Relative: 4 %
HCT: 42.4 % (ref 39.0–52.0)
Hemoglobin: 14.1 g/dL (ref 13.0–17.0)
Immature Granulocytes: 0 %
Lymphocytes Relative: 39 %
Lymphs Abs: 1.5 10*3/uL (ref 0.7–4.0)
MCH: 29.4 pg (ref 26.0–34.0)
MCHC: 33.3 g/dL (ref 30.0–36.0)
MCV: 88.3 fL (ref 80.0–100.0)
Monocytes Absolute: 0.4 10*3/uL (ref 0.1–1.0)
Monocytes Relative: 11 %
Neutro Abs: 1.8 10*3/uL (ref 1.7–7.7)
Neutrophils Relative %: 45 %
Platelets: 45 10*3/uL — ABNORMAL LOW (ref 150–400)
RBC: 4.8 MIL/uL (ref 4.22–5.81)
RDW: 12.7 % (ref 11.5–15.5)
WBC: 4 10*3/uL (ref 4.0–10.5)
nRBC: 0 % (ref 0.0–0.2)

## 2019-07-09 LAB — SURGICAL PCR SCREEN
MRSA, PCR: NEGATIVE
Staphylococcus aureus: NEGATIVE

## 2019-07-09 LAB — GLUCOSE, CAPILLARY: Glucose-Capillary: 407 mg/dL — ABNORMAL HIGH (ref 70–99)

## 2019-07-09 LAB — SARS CORONAVIRUS 2 (TAT 6-24 HRS): SARS Coronavirus 2: NEGATIVE

## 2019-07-09 NOTE — Progress Notes (Signed)
Patient denies shortness of breath, fever, cough and chest pain at PAT appointment  PCP -  Dr. Blanch Media, [place] Noss, [place] Cardiologist - Denies  Chest x-[place] - Denies EKG - 02/06/19 Stress Test - Denies ECHO - Denies Cardiac Cath - Denies  Fasting Blood Sugar - 115 Checks Blood Sugar ___1__ times a day  Blood Thinner Instructions: Stop 5 days prior to surgery per MD.  Plavix last dose was on Sunday, 07/06/19 per patient.  Anesthesia review: Yes.  Ebony Hail, [place]  STOP now taking any Aspirin (unless otherwise instructed by your surgeon), Aleve, Naproxen, Ibuprofen, Motrin, Advil, Goody's, BC's, all herbal medications, fish oil, and all vitamins.   Coronavirus Screening Have you or your wife experienced the following symptoms:  Cough yes/no: No Fever (>100.71F)  yes/no: No Runny nose yes/no: No Sore throat yes/no: No Difficulty breathing/shortness of breath  yes/no: No  Have you or your wife  traveled in the last 14 days and where? yes/no: No  Patient verbalized understanding of instructions that were given to them at the PAT appointment.

## 2019-07-09 NOTE — Progress Notes (Signed)
[place], [place] regarding patient's CBG 407 and blood pressure at PAT appointment 191/88 and 188/90 (manuel).    Spoke with patient and his wife Lelon Frohlich [phone] (Via speaker phone) to make them aware that patient need to follow the ADA diet from now until DOS.  Per Ebony Hail, PA instructed to go home and recheck his blood sugar and if it is still high, need to call Dr Blanch Media and take his insulin.  Also informed patient to check BP readings and if still high to contact his Dr Blanch Media.  Reminded both patient and wife Lelon Frohlich to make sure that patient takes all his prescribe medications on time as directed. Informed patient and his wife Lelon Frohlich that if his blood sugars were this high on DOS, his surgery could be cancelled. Patient and wife verbalized understanding of following ADA diet and taking blood pressure medication as prescribed.Marland Kitchen

## 2019-07-09 NOTE — Progress Notes (Signed)
[facility] [phone] - [place], [place] [address] [place] [address] Phone: [phone] Fax: [phone]      Your procedure is scheduled on Friday, 07/11/19.  Report to [facility] Main Entrance "A" at 5:30 A.M., and check in at the Admitting office.  Call this number if you have problems the morning of surgery:  [phone]  Call [phone] if you have any questions prior to your surgery date Monday-Friday 8am-4pm    Remember:  Do not eat or drink after midnight the night before your surgery (Thursday)   Take these medicines the morning of surgery with A SIP OF WATER : acetaminophen (TYLENOL) if needed atorvastatin (LIPITOR) gabapentin (NEURONTIN)  labetalol (NORMODYNE)  NIFEdipine (PROCARDIA XL/NIFEDICAL-XL)    WHAT DO I DO ABOUT MY DIABETES MEDICATION?  Marland Kitchen Do not take Jardiance or Glipizide oral diabetes medicines (pills) the morning of surgery .  THE NIGHT BEFORE SURGERY, take 50 % of your Basaglar insulin dose. (10 units)   How do I manage my blood sugar before surgery?  . If your blood sugar is less than 70 mg/dL, you will need to treat for low blood sugar: o Do not take insulin. o Treat a low blood sugar (less than 70 mg/dL) with  cup of clear juice (cranberry or apple), 4 glucose tablets, OR glucose gel. o Recheck blood sugar in 15 minutes after treatment (to make sure it is greater than 70 mg/dL). If your blood sugar is not greater than 70 mg/dL on recheck, call [phone] for further instructions. . Report your blood sugar to the short stay nurse when you get to Short Stay.  . If you are admitted to the hospital after surgery: o Your blood sugar will be checked by the staff and you will probably be given insulin after surgery (instead of oral diabetes medicines) to make sure you have good blood sugar levels. o The goal for blood sugar control after surgery is 80-180 mg/dL.  STOP now taking  any Aspirin (unless otherwise instructed by your surgeon), Aleve, Naproxen, Ibuprofen, Motrin, Advil, Goody's, BC's, all herbal medications, fish oil, and all vitamins.    The Morning of Surgery  Do not wear jewelry.  Do not wear lotions, powders, colognes, or deodorant  Men may shave face and neck.  Do not bring valuables to the hospital.  [facility] [place] is not responsible for any belongings or valuables.  If you are a smoker, DO NOT Smoke 24 hours prior to surgery  IF you wear a CPAP at night please bring your mask, tubing, and machine the morning of surgery   Remember that you must have someone to transport you home after your surgery, and remain with you for 24 hours if you are discharged the same day.  Patients discharged the day of surgery will not be allowed to drive home.    Contacts, glasses, hearing aids, dentures or bridgework may not be worn into surgery.   Leave your suitcase in the car.  After surgery it may be brought to your room.  For patients admitted to the hospital, discharge time will be determined by your treatment team.    Special instructions:   [place]- Preparing For Surgery  Before surgery, you can play an important role. Because skin is not sterile, your skin needs to be as free of germs as possible. You can reduce the number of germs on your skin  by washing with CHG (chlorahexidine gluconate) Soap before surgery.  CHG is an antiseptic cleaner which kills germs and bonds with the skin to continue killing germs even after washing.    Oral Hygiene is also important to reduce your risk of infection.  Remember - BRUSH YOUR TEETH THE MORNING OF SURGERY WITH YOUR REGULAR TOOTHPASTE  Please do not use if you have an allergy to CHG or antibacterial soaps. If your skin becomes reddened/irritated stop using the CHG.  Do not shave (including legs and underarms) for at least 48 hours prior to first CHG shower. It is OK to shave your face.  Please follow these  instructions carefully.   1. Shower the [employer] BEFORE SURGERY (Thurs) and the MORNING OF SURGERY (Fri) with CHG Soap.   2. If you chose to wash your hair, wash your hair first as usual with your normal shampoo.  3. After you shampoo, rinse your hair and body thoroughly to remove the shampoo.  4. Use CHG as you would any other liquid soap. You can apply CHG directly to the skin and wash gently with a scrungie or a clean washcloth.   5. Apply the CHG Soap to your body ONLY FROM THE NECK DOWN.  Do not use on open wounds or open sores. Avoid contact with your eyes, ears, mouth and genitals (private parts). Wash Face and genitals (private parts)  with your normal soap.   6. Wash thoroughly, paying special attention to the area where your surgery will be performed.  7. Thoroughly rinse your body with warm water from the neck down.  8. DO NOT shower/wash with your normal soap after using and rinsing off the CHG Soap.  9. Pat yourself dry with a CLEAN TOWEL.  10. Wear CLEAN PAJAMAS to bed the night before surgery, wear comfortable clothes the morning of surgery  11. Place CLEAN SHEETS on your bed the night of your first shower and DO NOT SLEEP WITH PETS.    Day of Surgery:  Do not apply any deodorants/lotions. Please shower the morning of surgery with the CHG soap  Please wear clean clothes to the hospital/surgery center.   Remember to brush your teeth WITH YOUR REGULAR TOOTHPASTE.   Please read over the following fact sheets that you were given.

## 2019-07-09 NOTE — Progress Notes (Signed)
Anesthesia Chart Review:  Case: [id number] Date/Time: 07/11/19 0715   Procedure: BYPASS GRAFT FEMORAL-DISTAL POPLITEAL ARTERY (Left )   Anesthesia type: General   Pre-op diagnosis: LEFT LEG PERIPHERAL VASCULAR DISEASE   Location: [facility] OR ROOM 12 / [facility] OR   Surgeon: [name], MD      DISCUSSION: Patient is a 53 year old male scheduled for the above procedure. Surgery was initially scheduled for 06/02/19, but it was postponed until he could under further hematology work-up for thrombocytopenia.   Since then patient has seen Derek Jack, MD. He has undergone multiple lab and imaging studies. In summary: - PLT count was 53K 05/29/19. No bleeding issues. - Prior heavy alcohol use, reportedly quit 11/2018.  - Nutritional deficiencies ruled out. Rheumatoid factor elevated at 124, but no clinical symptoms. - Ultrasound of the liver on 05/30/19 showed mild changes consistent with early cirrhosis.  Spleen size was normal. - Differential diagnosis included immune thrombocytopenia, so given a trial of dexamethasone on 06/22/2019 for 4 days. Repeat platelet count stable at 48K. He did not get any response with steroids. - Recommend giving 1 unit of platelets if there is any bleeding or oozing more than anticipated during or after surgery. -Follow-up in 4 weeks with repeat platelets.   History includes former smoker (quit 01/25/19), PVD (s/p bilateral CIA stents 02/06/19), thrombocytopenia, HTN, HLD, DM2 (with history of non-ketotic hypersosmolar syndrome), hypothyroidism, syncope.   - Hospitalization at [facility] 01/14/19-12/2018 after syncopal episode and falling off porch. Labs showed Glucose 725, Na 117, Cr 2.61, PLT 47K, ALT 70, AST 87 and was admitted with primary diagnoses with nonketotic hyperosmolar hyperglycemia, hypernatremia, and acute renal failure. Apparently was a known diabetic at that time with prior A1c of 15.0. He had on-going out-patient follow-up with Dr. Sherrie Sport since his  discharge with improvement in his A1c to 6.3%. (Unfortunately, A1c up to 9.9% on 07/09/19.)   On arrival to PAT non-fasting CBG 407. BP also elevated at 188/90. He reported taking his morning medications, but admits to not adhering to an ADA diet. He was asymptomatic and wife was driving. He has PM insulin he can take at home, and was advised to recheck CBG at home and contact PCP if remains elevated or go to the ED if becomes symptomatic. Also advised to contact PCP if BP remains elevated. He periodically checks his home fasting CBGs which can run ~ 120's-160's. He says he also monitors him BP at home with reading typically ~ 130's-160's/70's.    His preoperative labs are consistent with his known history. PLT count 45K. Diabetes control has worsened. BP also elevated, although with reported better readings at home. Plans to proceed as scheduled could be influenced by glucose and BP results. I will forward results to Dr. Carlis Abbott and [facility] RNs Zigmund Daniel and Pitts to review. Also if platelet transfusion is needed on arrival for surgery, then Dr. Carlis Abbott will need to order.   Last Plavix 07/06/19 with continuation of ASA. 07/08/19 COVID-19 test negative.      VS: BP (!) 188/90 Comment: taken manually/notified Gwenyth Bender.RN  Pulse 86   Temp 36.8 C   Resp 20   Ht 6\' 1"  (1.854 m)   Wt 97.3 kg   SpO2 100%   BMI 28.29 kg/m  Reported home BP ~ 130's-160's/70's.  PROVIDERS: Neale Burly, MD is PCP.  Last visit 04/15/19 (records on chart).    LABS: Preoperative labs noted. PLT count 45K which is consistent with prior results. H/H 14.1/42.4. LFTS remain  mildly elevated. Glucose 298 (down from his CBG of 407). Since 05/27/19, his A1c has increased from 6.3% to 9.9% (average glucose 237).  (all labs ordered are listed, but only abnormal results are displayed)  Labs Reviewed  GLUCOSE, CAPILLARY - Abnormal; Notable for the following components:      Result Value   Glucose-Capillary 407 (*)    All other components  within normal limits  CBC WITH DIFFERENTIAL/PLATELET - Abnormal; Notable for the following components:   Platelets 45 (*)    All other components within normal limits  COMPREHENSIVE METABOLIC PANEL - Abnormal; Notable for the following components:   Glucose, Bld 298 (*)    Total Protein 8.2 (*)    Albumin 2.9 (*)    AST 52 (*)    ALT 47 (*)    All other components within normal limits  URINALYSIS, ROUTINE W REFLEX MICROSCOPIC - Abnormal; Notable for the following components:   Color, Urine STRAW (*)    Glucose, UA >=500 (*)    All other components within normal limits  SURGICAL PCR SCREEN  HEMOGLOBIN A1C  TYPE AND SCREEN     IMAGES: US Liver 06/17/19: IMPRESSION: - No evidence of hepatic, portal or splenic venous thrombosis or occlusion. - Mild findings are noted suggesting hepatic cirrhosis. No significant focal sonographic hepatic abnormality is noted. Recannulized umbilical vein is noted.    EKG: 02/06/2019: Normal sinus rhythm Early repolarization Normal ECG No old tracing to compare Confirmed by Glori Bickers [phone]) on 02/06/2019 8:33:59 PM - Overall, I think tracing is stable when compared to 01/14/19 tracing from [facility]. When I spoke with him on 05/30/19, he denied chest pain, SOB. He is not very active due to his claudication/rest pain. 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 Denied known CAD/MI.     CV: Denied prior cath, stress, echo.   Past Medical History:  Diagnosis Date  . Acute renal failure (ARF) ([place])    12/2018 when admitted for hyperosmolar hyperglycemic nonketotic syndrome  . Bilateral pain of leg and foot   . Diabetes mellitus without complication ([place])    Type II  . Dizziness   . Elevated LFTs 05/2019  . Frequent urination   . Hyperglycemia    Non-Ketotic Hyperosmolar.  . Hyperlipidemia   . Hypernatremia   . Hypertension   . Peripheral vascular disease ([place])   . Syncope    12/2018, diagnosed with  hyperosmolar hyperglycemic nonketotic syndrome, AKI, hyponatremia ([facility])  . Thrombocytopenia ([place])   . Thyroid disease    Hypothyroidism  . Weakness     Past Surgical History:  Procedure Laterality Date  . ABDOMINAL AORTOGRAM W/LOWER EXTREMITY N/A 02/06/2019   Procedure: ABDOMINAL AORTOGRAM W/LOWER EXTREMITY;  Surgeon: [name], MD;  Location: [place] [facility];  Service: Cardiovascular;  Laterality: N/A;  bilateral  . ABDOMINAL AORTOGRAM W/LOWER EXTREMITY N/A 04/30/2019   Procedure: ABDOMINAL AORTOGRAM W/LOWER EXTREMITY;  Surgeon: [name], MD;  Location: [place] [facility];  Service: Cardiovascular;  Laterality: N/A;  . MULTIPLE TOOTH EXTRACTIONS    . PERIPHERAL VASCULAR INTERVENTION  02/06/2019   Procedure: PERIPHERAL VASCULAR INTERVENTION;  Surgeon: [name], MD;  Location: [place] [facility];  Service: Cardiovascular;;  Bilateral Iliacs  . PERIPHERAL VASCULAR INTERVENTION  04/30/2019   Procedure: PERIPHERAL VASCULAR INTERVENTION;  Surgeon: [name], MD;  Location: [place] [facility];  Service: Cardiovascular;;  Stent - Lt. Iliac     MEDICATIONS: . Accu-Chek FastClix  Lancets MISC  . acetaminophen (TYLENOL) 500 MG tablet  . atorvastatin (LIPITOR) 80 MG tablet  . chlorthalidone (HYGROTON) 50 MG tablet  . clopidogrel (PLAVIX) 75 MG tablet  . diphenhydrAMINE (BENADRYL) 25 MG tablet  . empagliflozin (JARDIANCE) 10 MG TABS tablet  . gabapentin (NEURONTIN) 300 MG capsule  . glipiZIDE (GLUCOTROL) 5 MG tablet  . Insulin Glargine (BASAGLAR KWIKPEN) 100 UNIT/ML SOPN  . labetalol (NORMODYNE) 300 MG tablet  . mupirocin ointment (BACTROBAN) 2 %  . nicotine (NICODERM CQ - DOSED IN MG/24 HOURS) 21 mg/24hr patch  . NIFEdipine (PROCARDIA XL/NIFEDICAL-XL) 90 MG 24 hr tablet   No current facility-administered medications for this encounter.     Myra Gianotti, PA-C Surgical Short Stay/Anesthesiology [facility] Phone [phone] [facility] Phone [phone] 07/10/2019 7:57 AM

## 2019-07-10 ENCOUNTER — Other Ambulatory Visit: Payer: Self-pay | Admitting: *Deleted

## 2019-07-10 LAB — HEMOGLOBIN A1C
Hgb A1c MFr Bld: 9.9 % — ABNORMAL HIGH (ref 4.8–5.6)
Mean Plasma Glucose: 237 mg/dL

## 2019-07-10 NOTE — Anesthesia Preprocedure Evaluation (Addendum)
Anesthesia Evaluation  Patient identified by MRN, date of birth, ID band Patient awake    Reviewed: Allergy & Precautions, NPO status , Patient's Chart, lab work & pertinent test results  Airway Mallampati: II  TM Distance: >3 FB     Dental   Pulmonary Patient abstained from smoking., former smoker,    breath sounds clear to auscultation       Cardiovascular hypertension, + Peripheral Vascular Disease   Rhythm:Regular Rate:Normal     Neuro/Psych    GI/Hepatic negative GI ROS, Neg liver ROS,   Endo/Other  diabetes  Renal/GU Renal disease     Musculoskeletal   Abdominal   Peds  Hematology   Anesthesia Other Findings   Reproductive/Obstetrics                            Anesthesia Physical Anesthesia Plan  ASA: III  Anesthesia Plan: General   Post-op Pain Management:    Induction: Intravenous  PONV Risk Score and Plan: 2 and Ondansetron, Dexamethasone and Midazolam  Airway Management Planned: Oral ETT  Additional Equipment:   Intra-op Plan:   Post-operative Plan: Possible Post-op intubation/ventilation  Informed Consent: I have reviewed the patients History and Physical, chart, labs and discussed the procedure including the risks, benefits and alternatives for the proposed anesthesia with the patient or authorized representative who has indicated his/her understanding and acceptance.     Dental advisory given  Plan Discussed with: CRNA, Anesthesiologist and Surgeon  Anesthesia Plan Comments: (See PAT note written 07/10/2019 by Myra Gianotti, PA-C.  )      Anesthesia Quick Evaluation

## 2019-07-11 ENCOUNTER — Inpatient Hospital Stay (HOSPITAL_COMMUNITY): Payer: Managed Care, Other (non HMO) | Admitting: Vascular Surgery

## 2019-07-11 ENCOUNTER — Other Ambulatory Visit: Payer: Self-pay

## 2019-07-11 ENCOUNTER — Emergency Department (HOSPITAL_COMMUNITY): Payer: Managed Care, Other (non HMO)

## 2019-07-11 ENCOUNTER — Encounter (HOSPITAL_COMMUNITY)
Admission: RE | Disposition: A | Discharge status: Home / Self Care | Payer: Self-pay | Source: Home / Self Care | Attending: Vascular Surgery

## 2019-07-11 ENCOUNTER — Emergency Department (HOSPITAL_COMMUNITY)
Admission: RE | Admit: 2019-07-11 | Discharge: 2019-07-11 | Disposition: A | Discharge status: Home / Self Care | Payer: Managed Care, Other (non HMO) | Attending: Emergency Medicine | Admitting: Emergency Medicine

## 2019-07-11 ENCOUNTER — Encounter (HOSPITAL_COMMUNITY): Payer: Self-pay | Admitting: Surgery

## 2019-07-11 DIAGNOSIS — I10 Essential (primary) hypertension: Secondary | ICD-10-CM | POA: Insufficient documentation

## 2019-07-11 DIAGNOSIS — R0602 Shortness of breath: Secondary | ICD-10-CM | POA: Insufficient documentation

## 2019-07-11 DIAGNOSIS — Z79899 Other long term (current) drug therapy: Secondary | ICD-10-CM | POA: Insufficient documentation

## 2019-07-11 DIAGNOSIS — E039 Hypothyroidism, unspecified: Secondary | ICD-10-CM | POA: Diagnosis not present

## 2019-07-11 DIAGNOSIS — T7840XA Allergy, unspecified, initial encounter: Secondary | ICD-10-CM

## 2019-07-11 LAB — BASIC METABOLIC PANEL
Anion gap: 12 (ref 5–15)
BUN: 15 mg/dL (ref 6–20)
CO2: 23 mmol/L (ref 22–32)
Calcium: 9.1 mg/dL (ref 8.9–10.3)
Chloride: 101 mmol/L (ref 98–111)
Creatinine, Ser: 1.51 mg/dL — ABNORMAL HIGH (ref 0.61–1.24)
GFR calc Af Amer: 59 mL/min — ABNORMAL LOW (ref >60)
GFR calc non Af Amer: 51 mL/min — ABNORMAL LOW (ref >60)
Glucose, Bld: 206 mg/dL — ABNORMAL HIGH (ref 70–99)
Potassium: 3.5 mmol/L (ref 3.5–5.1)
Sodium: 136 mmol/L (ref 135–145)

## 2019-07-11 LAB — CBC
HCT: 40.9 % (ref 39.0–52.0)
Hemoglobin: 13.4 g/dL (ref 13.0–17.0)
MCH: 29.4 pg (ref 26.0–34.0)
MCHC: 32.8 g/dL (ref 30.0–36.0)
MCV: 89.7 fL (ref 80.0–100.0)
Platelets: 64 10*3/uL — ABNORMAL LOW (ref 150–400)
RBC: 4.56 MIL/uL (ref 4.22–5.81)
RDW: 13 % (ref 11.5–15.5)
WBC: 5.8 10*3/uL (ref 4.0–10.5)
nRBC: 0 % (ref 0.0–0.2)

## 2019-07-11 LAB — TROPONIN I (HIGH SENSITIVITY)
Troponin I (High Sensitivity): 13 ng/L (ref <18)
Troponin I (High Sensitivity): 12 ng/L (ref <18)

## 2019-07-11 LAB — CK TOTAL AND CKMB (NOT AT ARMC)
CK, MB: 9.6 ng/mL — ABNORMAL HIGH (ref 0.5–5.0)
Relative Index: 7.1 — ABNORMAL HIGH (ref 0.0–2.5)
Total CK: 136 U/L (ref 49–397)

## 2019-07-11 LAB — TYPE AND SCREEN
ABO/RH(D): O POS
Antibody Screen: NEGATIVE

## 2019-07-11 LAB — GLUCOSE, CAPILLARY: Glucose-Capillary: 202 mg/dL — ABNORMAL HIGH (ref 70–99)

## 2019-07-11 SURGERY — BYPASS GRAFT FEMORAL-POPLITEAL ARTERY
Anesthesia: General | Laterality: Left

## 2019-07-11 MED ORDER — METHYLPREDNISOLONE SODIUM SUCC 125 MG IJ SOLR
INTRAMUSCULAR | Status: DC | PRN
  Administered 2019-07-11: 125 mg via INTRAVENOUS

## 2019-07-11 MED ORDER — SUCCINYLCHOLINE CHLORIDE 200 MG/10ML IV SOSY
PREFILLED_SYRINGE | INTRAVENOUS | Status: AC
  Filled 2019-07-11: qty 10

## 2019-07-11 MED ORDER — FAMOTIDINE IN NACL 20-0.9 MG/50ML-% IV SOLN
20.0000 mg | Freq: Once | INTRAVENOUS | Status: AC
  Administered 2019-07-11: 20 mg via INTRAVENOUS
  Filled 2019-07-11: qty 50

## 2019-07-11 MED ORDER — ONDANSETRON HCL 4 MG/2ML IJ SOLN
4.0000 mg | Freq: Once | INTRAMUSCULAR | Status: AC
  Administered 2019-07-11: 4 mg via INTRAVENOUS
  Filled 2019-07-11: qty 2

## 2019-07-11 MED ORDER — DIPHENHYDRAMINE HCL 50 MG/ML IJ SOLN
INTRAMUSCULAR | Status: AC
  Filled 2019-07-11: qty 1

## 2019-07-11 MED ORDER — ROCURONIUM BROMIDE 10 MG/ML (PF) SYRINGE
PREFILLED_SYRINGE | INTRAVENOUS | Status: AC
  Filled 2019-07-11: qty 10

## 2019-07-11 MED ORDER — CEFAZOLIN SODIUM-DEXTROSE 2-4 GM/100ML-% IV SOLN: 2.0000 g | INTRAVENOUS | Status: DC

## 2019-07-11 MED ORDER — METHYLPREDNISOLONE SODIUM SUCC 125 MG IJ SOLR
INTRAMUSCULAR | Status: AC
  Filled 2019-07-11: qty 2

## 2019-07-11 MED ORDER — MIDAZOLAM HCL 2 MG/2ML IJ SOLN
INTRAMUSCULAR | Status: AC
  Filled 2019-07-11: qty 2

## 2019-07-11 MED ORDER — CHLORHEXIDINE GLUCONATE CLOTH 2 % EX PADS: 6.0000 | MEDICATED_PAD | Freq: Once | CUTANEOUS | Status: DC

## 2019-07-11 MED ORDER — FENTANYL CITRATE (PF) 250 MCG/5ML IJ SOLN
INTRAMUSCULAR | Status: AC
  Filled 2019-07-11: qty 5

## 2019-07-11 MED ORDER — LIDOCAINE 2% (20 MG/ML) 5 ML SYRINGE
INTRAMUSCULAR | Status: AC
  Filled 2019-07-11: qty 5

## 2019-07-11 MED ORDER — SODIUM CHLORIDE 0.9% IV SOLUTION: Freq: Once | INTRAVENOUS | Status: DC

## 2019-07-11 MED ORDER — DIPHENHYDRAMINE HCL 50 MG/ML IJ SOLN
INTRAMUSCULAR | Status: DC | PRN
  Administered 2019-07-11: 12.5 mg via INTRAVENOUS

## 2019-07-11 MED ORDER — PROPOFOL 10 MG/ML IV BOLUS
INTRAVENOUS | Status: AC
  Filled 2019-07-11: qty 20

## 2019-07-11 MED ORDER — CEFAZOLIN SODIUM-DEXTROSE 2-4 GM/100ML-% IV SOLN
INTRAVENOUS | Status: AC
  Filled 2019-07-11: qty 100

## 2019-07-11 MED ORDER — PREDNISONE 20 MG PO TABS: 40.0000 mg | ORAL_TABLET | Freq: Every day | ORAL | 0 refills | Status: DC

## 2019-07-11 MED ORDER — EPHEDRINE 5 MG/ML INJ
INTRAVENOUS | Status: AC
  Filled 2019-07-11: qty 10

## 2019-07-11 MED ORDER — SODIUM CHLORIDE 0.9 % IV SOLN
INTRAVENOUS | Status: AC
  Filled 2019-07-11: qty 1.2

## 2019-07-11 MED ORDER — SODIUM CHLORIDE 0.9 % IV SOLN: INTRAVENOUS | Status: DC

## 2019-07-11 MED ORDER — DIPHENHYDRAMINE HCL 50 MG/ML IJ SOLN
12.5000 mg | Freq: Once | INTRAMUSCULAR | Status: AC
  Administered 2019-07-11: 12.5 mg via INTRAVENOUS
  Filled 2019-07-11: qty 1

## 2019-07-11 MED ORDER — PHENYLEPHRINE 40 MCG/ML (10ML) SYRINGE FOR IV PUSH (FOR BLOOD PRESSURE SUPPORT)
PREFILLED_SYRINGE | INTRAVENOUS | Status: AC
  Filled 2019-07-11: qty 10

## 2019-07-11 MED ORDER — HEPARIN SODIUM (PORCINE) 1000 UNIT/ML IJ SOLN
INTRAMUSCULAR | Status: AC
  Filled 2019-07-11: qty 1

## 2019-07-11 NOTE — Discharge Instructions (Addendum)
Benadryl as needed.  Prednisone daily starting tomorrow morning. You received your first dose in the ER.   Call Dr. Ainsley Spinner office today to reschedule your procedure.   You should return to the emergency department immediately if you have difficulty breathing, feel as if you cannot swallow, new or worsening symptoms develop, any additional concerns.

## 2019-07-11 NOTE — ED Provider Notes (Signed)
[facility] EMERGENCY DEPARTMENT Provider Note   CSN: [id number] Arrival date & time: 07/11/19  0801     History   Chief Complaint Chief Complaint  Patient presents with  . Allergic Reaction    HPI Edward Simrell. is a 53 y.o. male.     The history is provided by the patient and medical records. No language interpreter was used.  Allergic Reaction Presenting symptoms: no difficulty swallowing, no rash and no wheezing    Edward Burchill. is a 53 y.o. male  with a PMH as listed below who presents to the Emergency Department from short stay after concern for allergic reaction.  Patient was receiving platelets prior to scheduled surgery by Dr. Carlis Abbott of vascular this morning.  While receiving platelets, his oxygen saturation dropped into the 40s and he required bag mask ventilation.  Apparently he seemed diaphoretic and confused.  Per nursing staff did complain of chest tightness and nausea.  Labs were ordered by anesthesia and he was given 12.5 of Benadryl and Solu-Medrol.  Did have noticeable improvement.  Currently, he denies any chest pain.  He reports that he did have a central tightness which improved shortly after getting Benadryl/Solu-Medrol.  Currently denies any chest pain or chest tightness.  Reports that his breathing has now improved and feels much better. Still a little nauseous, but also improving.   Past Medical History:  Diagnosis Date  . Acute renal failure (ARF) ([place])    12/2018 when admitted for hyperosmolar hyperglycemic nonketotic syndrome  . Bilateral pain of leg and foot   . Diabetes mellitus without complication ([place])    Type II  . Dizziness   . Elevated LFTs 05/2019  . Frequent urination   . Hyperglycemia    Non-Ketotic Hyperosmolar.  . Hyperlipidemia   . Hypernatremia   . Hypertension   . Peripheral vascular disease ([place])   . Syncope    12/2018, diagnosed with hyperosmolar hyperglycemic nonketotic syndrome, AKI, hyponatremia  ([facility])  . Thrombocytopenia ([place])   . Thyroid disease    Hypothyroidism  . Weakness     Patient Active Problem List   Diagnosis Date Noted  . Thrombocytopenia ([place]) 05/29/2019  . Diabetes ([place]) 05/29/2019  . Critical lower limb ischemia 04/22/2019    Past Surgical History:  Procedure Laterality Date  . ABDOMINAL AORTOGRAM W/LOWER EXTREMITY N/A 02/06/2019   Procedure: ABDOMINAL AORTOGRAM W/LOWER EXTREMITY;  Surgeon: [name], MD;  Location: [facility];  Service: Cardiovascular;  Laterality: N/A;  bilateral  . ABDOMINAL AORTOGRAM W/LOWER EXTREMITY N/A 04/30/2019   Procedure: ABDOMINAL AORTOGRAM W/LOWER EXTREMITY;  Surgeon: [name], MD;  Location: [facility];  Service: Cardiovascular;  Laterality: N/A;  . MULTIPLE TOOTH EXTRACTIONS    . PERIPHERAL VASCULAR INTERVENTION  02/06/2019   Procedure: PERIPHERAL VASCULAR INTERVENTION;  Surgeon: [name], MD;  Location: [facility];  Service: Cardiovascular;;  Bilateral Iliacs  . PERIPHERAL VASCULAR INTERVENTION  04/30/2019   Procedure: PERIPHERAL VASCULAR INTERVENTION;  Surgeon: [name], MD;  Location: [facility];  Service: Cardiovascular;;  Stent - Lt. Iliac         Home Medications    Prior to Admission medications   Medication Sig Start Date End Date Taking? Authorizing Provider  acetaminophen (TYLENOL) 500 MG tablet Take 1,000 mg by mouth daily as needed for moderate pain or headache.   Yes [provider]  atorvastatin (LIPITOR) 80 MG tablet Take 80 mg  by mouth daily.  04/16/19  Yes [provider]  chlorthalidone (HYGROTON) 50 MG tablet Take 50 mg by mouth daily.   Yes [provider]  clopidogrel (PLAVIX) 75 MG tablet Take 75 mg by mouth daily.   Yes [provider]  empagliflozin (JARDIANCE) 10 MG TABS tablet Take 10 mg by mouth daily.   Yes [provider]  gabapentin (NEURONTIN) 300 MG capsule Take 300 mg  by mouth 3 (three) times daily.   Yes [provider]  glipiZIDE (GLUCOTROL) 5 MG tablet Take 5 mg by mouth 2 (two) times daily before a meal.    Yes [provider]  Insulin Glargine (BASAGLAR KWIKPEN) 100 UNIT/ML SOPN Inject 20 Units into the skin every evening.   Yes [provider]  labetalol (NORMODYNE) 300 MG tablet Take 300 mg by mouth 2 (two) times daily.   Yes [provider]  mupirocin ointment (BACTROBAN) 2 % APPLY TO THE INSIDE OF EACH NOSTRIL BID FOR 5 DAYS 05/27/19  Yes [provider]  NIFEdipine (PROCARDIA XL/NIFEDICAL-XL) 90 MG 24 hr tablet TK 1 T PO QD 06/18/19  Yes [provider]  Accu-Chek FastClix Lancets MISC TEST ONC EDAILY 05/13/19   [provider]  diphenhydrAMINE (BENADRYL) 25 MG tablet Take 25 mg by mouth daily as needed for itching.    [provider]  nicotine (NICODERM CQ - DOSED IN MG/24 HOURS) 21 mg/24hr patch Place 21 mg onto the skin daily. 04/16/19   [provider]    Family History Family History  Problem Relation Age of Onset  . Alcohol abuse Father   . Cancer Mother   . Alcohol abuse Brother   . Bipolar disorder Son   . Bipolar disorder Daughter     Social History Social History   Tobacco Use  . Smoking status: Former Smoker    Packs/day: 1.00    Years: 36.00    Pack years: 36.00    Types: Cigarettes    Quit date: 01/25/2019    Years since quitting: 0.4  . Smokeless tobacco: Never Used  Substance Use Topics  . Alcohol use: Not Currently    Frequency: Never    Comment: per pt's wife, pt is an alcoholic but just recently stopped drinking in 04/2019  . Drug use: Never     Allergies   Patient has no known allergies.   Review of Systems Review of Systems  HENT: Negative for sore throat and trouble swallowing.   Respiratory: Positive for chest tightness and shortness of breath. Negative for wheezing.   Cardiovascular: Positive for chest pain. Negative for  palpitations and leg swelling.  Gastrointestinal: Positive for nausea. Negative for abdominal pain and vomiting.  Skin: Negative for rash.       + itching  All other systems reviewed and are negative.    Physical Exam Updated Vital Signs BP (!) 188/90   Pulse 84   Temp 98.2 F (36.8 C) (Oral)   Resp 14   SpO2 97%   Physical Exam Vitals signs and nursing note reviewed.  Constitutional:      General: He is not in acute distress.    Appearance: He is well-developed.  HENT:     Head: Normocephalic and atraumatic.     Mouth/Throat:     Comments: Airway patent. Speaking in full sentences without difficulty.  Neck:     Musculoskeletal: Neck supple.  Cardiovascular:     Rate and Rhythm: Normal rate and regular rhythm.  Heart sounds: Normal heart sounds. No murmur.  Pulmonary:     Effort: Pulmonary effort is normal. No respiratory distress.     Breath sounds: Normal breath sounds.     Comments: Lungs clear to auscultation bilaterally.  Oxygenating about 96 to 98% on room air. Abdominal:     General: There is no distension.     Palpations: Abdomen is soft.     Tenderness: There is no abdominal tenderness.  Skin:    General: Skin is warm and dry.     Comments: Itching, but no rash appreciated.  Neurological:     Mental Status: He is alert and oriented to person, place, and time.      ED Treatments / Results  Labs (all labs ordered are listed, but only abnormal results are displayed) Labs Reviewed  GLUCOSE, CAPILLARY - Abnormal; Notable for the following components:      Result Value   Glucose-Capillary 202 (*)    All other components within normal limits  CBC - Abnormal; Notable for the following components:   Platelets 64 (*)    All other components within normal limits  BASIC METABOLIC PANEL - Abnormal; Notable for the following components:   Glucose, Bld 206 (*)    Creatinine, Ser 1.51 (*)    GFR calc non Af Amer 51 (*)    GFR calc Af Amer 59 (*)    All  other components within normal limits  CK TOTAL AND CKMB (NOT AT [facility]) - Abnormal; Notable for the following components:   CK, MB 9.6 (*)    Relative Index 7.1 (*)    All other components within normal limits  PREPARE PLATELET PHERESIS  TROPONIN I (HIGH SENSITIVITY)  TROPONIN I (HIGH SENSITIVITY)    EKG EKG Interpretation  Date/Time:  Friday July 11 2019 08:15:45 EDT Ventricular Rate:  87 PR Interval:    QRS Duration: 85 QT Interval:  407 QTC Calculation: 490 R Axis:   60 Text Interpretation:  Sinus rhythm Consider left atrial enlargement Borderline prolonged QT interval no acute ST/T changes Confirmed by Sherwood Gambler [phone]) on 07/11/2019 9:19:26 AM   Radiology Dg Chest Portable 1 View  Result Date: 07/11/2019 CLINICAL DATA:  Shortness of breath, dialysis EXAM: PORTABLE CHEST 1 VIEW COMPARISON:  12/12/2016 FINDINGS: Cardiomegaly. Both lungs are clear. The visualized skeletal structures are unremarkable. IMPRESSION: Cardiomegaly without acute abnormality of the lungs in AP portable projection. Electronically Signed   By: Eddie Candle M.D.   On: 07/11/2019 09:09    Procedures Procedures (including critical care time)  Medications Ordered in ED Medications  0.9 %  sodium chloride infusion (Manually program via Guardrails IV Fluids) (has no administration in time range)  diphenhydrAMINE (BENADRYL) 50 MG/ML injection (has no administration in time range)  methylPREDNISolone sodium succinate (SOLU-MEDROL) 125 mg/2 mL injection (  Override pull for Anesthesia 07/11/19 0731)  diphenhydrAMINE (BENADRYL) 50 MG/ML injection (  Override pull for Anesthesia 07/11/19 0733)  famotidine (PEPCID) IVPB 20 mg premix (0 mg Intravenous Stopped 07/11/19 0945)  diphenhydrAMINE (BENADRYL) injection 12.5 mg (12.5 mg Intravenous Given 07/11/19 0856)  ondansetron (ZOFRAN) injection 4 mg (4 mg Intravenous Given 07/11/19 0858)  propofol (DIPRIVAN) 10 mg/mL bolus/IV push (has no administration in time  range)  fentaNYL (SUBLIMAZE) 250 MCG/5ML injection (has no administration in time range)  midazolam (VERSED) 2 MG/2ML injection (has no administration in time range)  sodium chloride 0.9 % with heparin ADS Med (has no administration in time range)  succinylcholine (ANECTINE) 200 MG/10ML  syringe (has no administration in time range)  lidocaine 20 MG/ML injection (has no administration in time range)  rocuronium bromide 100 MG/10ML SOSY (has no administration in time range)  ePHEDrine 5 MG/ML injection (has no administration in time range)  phenylephrine 0.4-0.9 MG/10ML-% injection (has no administration in time range)  heparin 1000 UNIT/ML injection (has no administration in time range)     Initial Impression / Assessment and Plan / ED Course  I have reviewed the triage vital signs and the nursing notes.  Pertinent labs & imaging results that were available during my care of the patient were reviewed by me and considered in my medical decision making (see chart for details).       Edward Claborn. is a 53 y.o. male who presents to ED from short stay for likely allergic reaction during platelet transfusion.  Patient became short of breath with generalized itching and nausea.  Did have some chest tightness as well.  EKG unchanged from previous.  Troponin initially 13 with second troponin of 12.  Doubt ACS.  Chest x-Nickelson without acute findings.  Feels improved after Benadryl, Pepcid and Solu-Medrol.  Observed in the emergency department for 3 and half hours without acute events.  On eval just prior to discharge, patient hemodynamically stable with clear lung exam and oxygenating well on room air.  He feels comfortable with discharge.  Discussed that he will need to call Dr. Ainsley Spinner office to reschedule procedure that was planned for today.  Reasons to return to the ER were discussed at length and all questions were answered.  Final Clinical Impressions(s) / ED Diagnoses   Final diagnoses:   Allergic reaction, initial encounter    ED Discharge Orders    None       O[place]ola Mittman, Ozella Almond, PA-C 07/11/19 1134    Sherwood Gambler, MD 07/11/19 [phone]

## 2019-07-11 NOTE — Anesthesia Procedure Notes (Signed)
Performed by: Belinda Block, MD      Preop Note: In preop this am patient developed what appeared to be a reaction to platelet infusion with deceasing O2 sats and being agitated with SOB requiring brief mask ventilation.  Patient was then transported after labs drawn to ED for further evaluation and work up. Dr. Carlis Abbott was aware and at there bedside informed. Dr. Nyoka Cowden

## 2019-07-11 NOTE — ED Triage Notes (Signed)
Patient transferred from short stay following allergic reaction to unit of platelets given - per RN, as soon as platelets finished, patient began c/o chest pain and nausea and became diaphoretic. His SpO2 dropped to 40s and he had to be bagged briefly until switched over to NRB. Patient on regular O2 mask upon arrival to ED - 98% on 3.5L. He currently c/o nausea, itching, and shortness of breath but states chest pain resolved. Patient given 12.5mg  benadryl and 125mg  solumedrol PTA.

## 2019-07-11 NOTE — H&P (Signed)
History and Physical Interval Note:  07/11/2019 7:16 AM  Edward Rocha.  has presented today for surgery, with the diagnosis of LEFT LEG PERIPHERAL VASCULAR DISEASE.  The various methods of treatment have been discussed with the patient and family. After consideration of risks, benefits and other options for treatment, the patient has consented to  Procedure(s): BYPASS GRAFT FEMORAL-DISTAL POPLITEAL ARTERY (Left) as a surgical intervention.  The patient's history has been reviewed, patient examined, no change in status, stable for surgery.  I have reviewed the patient's chart and labs.  Questions were answered to the patient's satisfaction.     Left fem tibial bypass for CLI with rest pain.  I have previously placed multiple left iliac stents and I think his inflow is significantly improved.  Even so he has multilevel occlusive disease and still having significant rest pain.  We had delayed his surgery given a low platelet count and sent him to hematology.  He had a trial of steroids with no improvement.  Ultimately underwent extensive work-up.  Ultimately recommendation was to give platelets if needed.  His platelet count was 45,000 yesterday on preop labs and so we will give him a pack of platelets today.  I discussed risks of surgery in detail with him including increased risk of bleeding but given the severity of his foot pain with critical limb ischemia I do think this is urgent at this point and he is facing limb loss in the future otherwise.  Other risks include wound healing issues, early thrombosis and failure of bypass, risk of anesthesia, bleeding etc.  [name]  Patient name: Edward Rocha.         MRN: [id number]        DOB: 09-Apr-1966          Sex: male  REASON FOR VISIT: Follow-up after bilateral common iliac stenting and left external iliac stenting.  HPI: Edward Rocha. is a 53 y.o. male with history of hypertension, hyperlipidemia, diabetes that presents for  follow-up after bilateral common iliac stent placement on 02/06/2019 and then left external iliac stent on 04/30/19.  He was initially seen by Dr. Donnetta Hutching on 02/04/2019 with evidence of critical limb ischemia of the bilateral lower extremities with symptoms consistent with rest pain.  Ultimately underwent aortogram, lower extremity arteriogram with me on 02/06/2019.  At that time I placed bilateral common iliac stents.  We took him back for another aortogram given that it is 1 month follow-up they could not visualize his common iliac stents with monophasic runoff in both iliacs.  During that time we did identify a left external iliac stenosis that was stented and his previous bilateral kissing stents were not patent.  He continues to complain of pain in his left leg.  He states no significant provement with the most recent left external iliac stent.      Past Medical History:  Diagnosis Date  . Acute renal failure (ARF) ([place])   . Bilateral pain of leg and foot   . Diabetes mellitus without complication ([place])    Type II  . Dizziness   . Frequent urination   . Hyperglycemia    Non-Ketotic Hyperosmolar.  . Hyperlipidemia   . Hypernatremia   . Hypertension   . Peripheral vascular disease ([place])   . Syncope   . Thrombocytopenia ([place])   . Thyroid disease    Hypothyroidism  . Weakness          Past Surgical History:  Procedure Laterality Date  . ABDOMINAL AORTOGRAM W/LOWER EXTREMITY N/A 02/06/2019   Procedure: ABDOMINAL AORTOGRAM W/LOWER EXTREMITY;  Surgeon: [name], MD;  Location: [facility];  Service: Cardiovascular;  Laterality: N/A;  bilateral  . ABDOMINAL AORTOGRAM W/LOWER EXTREMITY N/A 04/30/2019   Procedure: ABDOMINAL AORTOGRAM W/LOWER EXTREMITY;  Surgeon: [name], MD;  Location: [facility];  Service: Cardiovascular;  Laterality: N/A;  . PERIPHERAL VASCULAR INTERVENTION  02/06/2019   Procedure: PERIPHERAL VASCULAR INTERVENTION;   Surgeon: [name], MD;  Location: [facility];  Service: Cardiovascular;;  Bilateral Iliacs  . PERIPHERAL VASCULAR INTERVENTION  04/30/2019   Procedure: PERIPHERAL VASCULAR INTERVENTION;  Surgeon: [name], MD;  Location: [facility];  Service: Cardiovascular;;  Stent - Lt. Iliac          Family History  Problem Relation Age of Onset  . Cancer Mother     SOCIAL HISTORY: Social History        Tobacco Use  . Smoking status: Current Every Day Smoker    Packs/day: 1.00    Years: 36.00    Pack years: 36.00    Types: Cigarettes  . Smokeless tobacco: Never Used  Substance Use Topics  . Alcohol use: Never    Frequency: Never    No Known Allergies        Current Outpatient Medications  Medication Sig Dispense Refill  . acetaminophen (TYLENOL) 500 MG tablet Take 1,000 mg by mouth daily as needed for moderate pain or headache.    . chlorthalidone (HYGROTON) 50 MG tablet Take 50 mg by mouth daily.    . clopidogrel (PLAVIX) 75 MG tablet Take 75 mg by mouth daily.    . diphenhydrAMINE (BENADRYL) 25 MG tablet Take 25 mg by mouth daily as needed for itching.    . empagliflozin (JARDIANCE) 10 MG TABS tablet Take 10 mg by mouth daily.    Marland Kitchen gabapentin (NEURONTIN) 300 MG capsule Take 300 mg by mouth 3 (three) times daily.    Marland Kitchen glipiZIDE (GLUCOTROL) 5 MG tablet Take 5 mg by mouth 2 (two) times daily before a meal.     . Insulin Glargine (BASAGLAR KWIKPEN) 100 UNIT/ML SOPN Inject 20 Units into the skin every evening.    . labetalol (NORMODYNE) 300 MG tablet Take 300 mg by mouth 2 (two) times daily.    . nicotine (NICODERM CQ - DOSED IN MG/24 HOURS) 21 mg/24hr patch Place 21 mg onto the skin daily.    . Omega-3 Fatty Acids (FISH OIL PO) Take 1 capsule by mouth 2 (two) times a day.    Marland Kitchen atorvastatin (LIPITOR) 80 MG tablet Take 80 mg by mouth daily.      No current facility-administered medications for this visit.      REVIEW OF SYSTEMS:  [X]  denotes positive finding, [ ]  denotes negative finding Cardiac  Comments:  Chest pain or chest pressure:    Shortness of breath upon exertion:    Short of breath when lying flat:    Irregular heart rhythm:        Vascular    Pain in calf, thigh, or hip brought on by ambulation:    Pain in feet at night that wakes you up from your sleep:  x Left worse  Blood clot in your veins:    Leg swelling:         Pulmonary    Oxygen at home:    Productive cough:  Wheezing:         Neurologic    Sudden weakness in arms or legs:     Sudden numbness in arms or legs:     Sudden onset of difficulty speaking or slurred speech:    Temporary loss of vision in one eye:     Problems with dizziness:         Gastrointestinal    Blood in stool:     Vomited blood:         Genitourinary    Burning when urinating:     Blood in urine:        Psychiatric    Major depression:         Hematologic    Bleeding problems:    Problems with blood clotting too easily:        Skin    Rashes or ulcers:        Constitutional    Fever or chills:      PHYSICAL EXAM:     Vitals:   05/20/19 0956 05/20/19 0959  BP: (!) 189/84 (!) 191/90  Pulse: 87 87  Resp: 18 16  Temp:  99.1 F (37.3 C)  TempSrc:  Temporal  SpO2:  100%  Weight: 212 lb (96.2 kg) 212 lb (96.2 kg)  Height: 6\' 1"  (1.854 m) 6\' 1"  (1.854 m)    GENERAL: The patient is a well-nourished male, in no acute distress. The vital signs are documented above. CARDIAC: There is a regular rate and rhythm.  VASCULAR:  1+ bilateral femoral pulse No tissue loss lower extremities No palpable pedal pulses Monophasic DP/PT signals bilaterally PULMONARY: There is good air exchange bilaterally without wheezing or rales. ABDOMEN: Soft and non-tender with normal pitched bowel sounds.  MUSCULOSKELETAL: There are no major  deformities or cyanosis. NEUROLOGIC: No focal weakness or paresthesias are detected.  DATA:   I independently reviewed his noninvasive imaging and largely noncompressible ABIs with monophasic waveforms at both ankles.   Assessment/Plan:  53 year old male that was previously evaluated for critical limb ischemia of the bilateral lower extremities with severe rest pain.  Ultimately underwent bilateral common iliac stenting on 02/06/2019 and then extended left external iliac stent on 04/30/19.  Unfortunately still having  rest pain in the left foot that wakes him up at night.  His ABIs are noncompressible.  He has a left popliteal occlusion as well as a very diseased SFA and reconstitutes what appears to be posterior tibial as his best runoff.  Ultimately severe multi-level occlusive disease.  I have now recommended a left femoral to posterior tibial artery bypass.  I will obtain vein mapping of his lower and upper extremities to see what conduit we have.  He will be scheduled for surgery a week from Monday and states he does not have the ability to have surgery next week.  I will have him hold his Plavix one week prior to surgery, may continue as aspirin.   [name], MD [facility] Office: [phone] Pager: [place]

## 2019-07-11 NOTE — Progress Notes (Signed)
Patient transferred to ED on stretcher, 5L O2 mask, on telemetry, in stable condition.  Report given to charge RN by phone and ED RN at bedside.  Belongings at bedside.  Called and updated wife to patients status and location.

## 2019-07-11 NOTE — ED Notes (Signed)
Upon arrival, patient was not receiving any blood products.

## 2019-07-11 NOTE — Transfer of Care (Signed)
Immediate Anesthesia Transfer of Care Note  Patient: Edward Rocha.  Procedure(s) Performed: BYPASS GRAFT FEMORAL-DISTAL POPLITEAL ARTERY (Left )  Patient Location: Short Stay  Anesthesia Type:cancelled case  Level of Consciousness: awake, alert  and oriented  Airway & Oxygen Therapy: Patient Spontanous Breathing and Patient connected to face mask oxygen  Post-op Assessment: Report given to RN and Post -op Vital signs reviewed and stable  Post vital signs: Reviewed and stable  Last Vitals:  Vitals Value Taken Time  BP    Temp    Pulse    Resp    SpO2      Last Pain:  Vitals:   07/11/19 0655  TempSrc: Oral  PainSc:       Patients Stated Pain Goal: 2 ([id number])  Complications: Possible platelet allergic reaction

## 2019-07-11 NOTE — Progress Notes (Signed)
53 year old male that was scheduled for left femoral posterior tibial bypass today.  He had been sent to hematology preop given a low platelet count that had been undiagnosed.  Ultimately had no response to steroids and the last hematology note stated give platelets if needed.  His platelet count was 45 on preop labs.  He was getting platelets this morning as planned prior to surgery.  I had already signed him in and discussed risk and benefits.  After I left he had a concern for allergic platelet reaction.  Per nursing and CRNA he dropped his oxygen saturations into the 40s and required bag mask ventilation became very confused and diaphoretic.  Obviously he is not safe for anesthesia at this time.  His case is going to be canceled.  He will be sent to the ED for further evaluation and work-up.  We will have to find a time to reschedule his surgery in the future.  I have updated his wife on the phone and discussed with Mr. Sherfey at bedside.  [name], MD [facility] Office: [phone] Pager: [place]

## 2019-07-11 NOTE — ED Notes (Signed)
Patient verbalized understanding of discharge instructions and denies any further needs or questions at this time. VS stable. Patient ambulatory with steady gait. Escorted to ED entrance in wheelchair.   

## 2019-07-12 LAB — BPAM PLATELET PHERESIS
Blood Product Expiration Date: 202009112359
ISSUE DATE / TIME: 202009110629
Unit Type and Rh: 7300

## 2019-07-12 LAB — PREPARE PLATELET PHERESIS: Unit division: 0

## 2019-07-29 ENCOUNTER — Ambulatory Visit (HOSPITAL_COMMUNITY): Payer: Managed Care, Other (non HMO) | Admitting: Hematology

## 2019-07-29 ENCOUNTER — Inpatient Hospital Stay (HOSPITAL_COMMUNITY): Payer: Managed Care, Other (non HMO) | Attending: Hematology

## 2019-07-30 ENCOUNTER — Other Ambulatory Visit: Payer: Self-pay | Admitting: *Deleted

## 2019-07-31 ENCOUNTER — Other Ambulatory Visit: Payer: Self-pay | Admitting: *Deleted

## 2019-08-06 NOTE — Progress Notes (Addendum)
[facility] [phone] - [place], [place] AT [address] [place] [address] Phone: [phone] Fax: [phone]      Your procedure is scheduled on Monday, October 12th.  Report to [facility] Main Entrance "A" at 0530 A.M., and check in at the Admitting office.  Call this number if you have problems the morning of surgery:  [phone]  Call [phone] if you have any questions prior to your surgery date Monday-Friday 8am-4pm    Remember:  Do not eat or drink after midnight the night before your surgery    Take these medicines the morning of surgery with A SIP OF WATER   atorvastatin (LIPITOR  gabapentin (NEURONTIN)  labetalol (NORMODYNE)   NIFEdipine (PROCARDIA XL/NIFEDICAL-XL)  IF NEEDED:  acetaminophen (TYLENOL)  diphenhydrAMINE (BENADRYL)  Follow your surgeon's instructions on when to stop Plavix.  If no instructions were given by your surgeon then you will need to call the office to get those instructions.    As of today, STOP taking any Aspirin, NSAIDs, such as Aleve, Naproxen, Ibuprofen, Motrin, Advil, Goody's, BC's, all herbal medications, fish oil, and all vitamins.   WHAT DO I DO ABOUT MY DIABETES MEDICATION?   Do not take your empagliflozin (JARDIANCE) the day before surgery.   Do not take your evening dose of glipiZIDE (GLUCOTROL) the night before your surgery.  . THE NIGHT BEFORE SURGERY, take 10 units of Insulin Glargine [facility]).   Do not take oral diabetes medicines (pills) the morning of surgery, such as empagliflozin (JARDIANCE), and glipiZIDE (GLUCOTROL)  . The day of surgery, do not take other diabetes injectables, including Byetta (exenatide), Bydureon (exenatide ER), Victoza (liraglutide), or Trulicity (dulaglutide).    How to Manage Your Diabetes Before and After Surgery  Why is it important to control my blood sugar before and after surgery? . Improving blood  sugar levels before and after surgery helps healing and can limit problems. . A way of improving blood sugar control is eating a healthy diet by: o  Eating less sugar and carbohydrates o  Increasing activity/exercise o  Talking with your doctor about reaching your blood sugar goals . High blood sugars (greater than 180 mg/dL) can raise your risk of infections and slow your recovery, so you will need to focus on controlling your diabetes during the weeks before surgery. . Make sure that the doctor who takes care of your diabetes knows about your planned surgery including the date and location.  How do I manage my blood sugar before surgery? . Check your blood sugar at least 4 times a day, starting 2 days before surgery, to make sure that the level is not too high or low. o Check your blood sugar the morning of your surgery when you wake up and every 2 hours until you get to the Short Stay unit. . If your blood sugar is less than 70 mg/dL, you will need to treat for low blood sugar: o Do not take insulin. o Treat a low blood sugar (less than 70 mg/dL) with  cup of clear juice (cranberry or apple), 4 glucose tablets, OR glucose gel. o Recheck blood sugar in 15 minutes after treatment (to make sure it is greater than 70 mg/dL). If your blood sugar is not greater than 70 mg/dL on recheck, call [phone] for further instructions. . Report your blood sugar to the short stay nurse when you get  to Short Stay.  . If you are admitted to the hospital after surgery: o Your blood sugar will be checked by the staff and you will probably be given insulin after surgery (instead of oral diabetes medicines) to make sure you have good blood sugar levels. o The goal for blood sugar control after surgery is 80-180 mg/dL.       The Morning of Surgery  Do not wear jewelry.  Do not wear lotions, powders, colognes, or deodorant  Men may shave face and neck.  Do not bring valuables to the hospital.  [facility] is not responsible for any belongings or valuables.  If you are a smoker, DO NOT Smoke 24 hours prior to surgery IF you wear a CPAP at night please bring your mask, tubing, and machine the morning of surgery   Remember that you must have someone to transport you home after your surgery, and remain with you for 24 hours if you are discharged the same day.   Contacts, glasses, hearing aids, dentures or bridgework may not be worn into surgery.    Leave your suitcase in the car.  After surgery it may be brought to your room.  For patients admitted to the hospital, discharge time will be determined by your treatment team.  Patients discharged the day of surgery will not be allowed to drive home.    Special instructions:   [place]- Preparing For Surgery  Before surgery, you can play an important role. Because skin is not sterile, your skin needs to be as free of germs as possible. You can reduce the number of germs on your skin by washing with CHG (chlorahexidine gluconate) Soap before surgery.  CHG is an antiseptic cleaner which kills germs and bonds with the skin to continue killing germs even after washing.    Oral Hygiene is also important to reduce your risk of infection.  Remember - BRUSH YOUR TEETH THE MORNING OF SURGERY WITH YOUR REGULAR TOOTHPASTE  Please do not use if you have an allergy to CHG or antibacterial soaps. If your skin becomes reddened/irritated stop using the CHG.  Do not shave (including legs and underarms) for at least 48 hours prior to first CHG shower. It is OK to shave your face.  Please follow these instructions carefully.   1. Shower the NIGHT BEFORE SURGERY and the MORNING OF SURGERY with CHG Soap.   2. If you chose to wash your hair, wash your hair first as usual with your normal shampoo.  3. After you shampoo, rinse your hair and body thoroughly to remove the shampoo.  4. Use CHG as you would any other liquid soap. You can apply CHG directly  to the skin and wash gently with a scrungie or a clean washcloth.   5. Apply the CHG Soap to your body ONLY FROM THE NECK DOWN.  Do not use on open wounds or open sores. Avoid contact with your eyes, ears, mouth and genitals (private parts). Wash Face and genitals (private parts)  with your normal soap.   6. Wash thoroughly, paying special attention to the area where your surgery will be performed.  7. Thoroughly rinse your body with warm water from the neck down.  8. DO NOT shower/wash with your normal soap after using and rinsing off the CHG Soap.  9. Pat yourself dry with a CLEAN TOWEL.  10. Wear CLEAN PAJAMAS to bed the night before surgery, wear comfortable clothes the morning of surgery  11. Place  CLEAN SHEETS on your bed the night of your first shower and DO NOT SLEEP WITH PETS.    Day of Surgery:  Do not apply any deodorants/lotions. Please shower the morning of surgery with the CHG soap  Please wear clean clothes to the hospital/surgery center.   Remember to brush your teeth WITH YOUR REGULAR TOOTHPASTE.   Please read over the following fact sheets that you were given.

## 2019-08-07 ENCOUNTER — Other Ambulatory Visit: Payer: Self-pay

## 2019-08-07 ENCOUNTER — Encounter (HOSPITAL_COMMUNITY)
Admission: RE | Admit: 2019-08-07 | Discharge: 2019-08-07 | Disposition: A | Discharge status: Home / Self Care | Payer: Managed Care, Other (non HMO) | Source: Ambulatory Visit | Attending: Vascular Surgery | Admitting: Vascular Surgery

## 2019-08-07 ENCOUNTER — Encounter (HOSPITAL_COMMUNITY): Payer: Self-pay

## 2019-08-07 ENCOUNTER — Other Ambulatory Visit (HOSPITAL_COMMUNITY)
Admission: RE | Admit: 2019-08-07 | Discharge: 2019-08-07 | Disposition: A | Discharge status: Home / Self Care | Payer: Managed Care, Other (non HMO) | Source: Ambulatory Visit | Attending: Vascular Surgery | Admitting: Vascular Surgery

## 2019-08-07 DIAGNOSIS — Z01818 Encounter for other preprocedural examination: Secondary | ICD-10-CM | POA: Insufficient documentation

## 2019-08-07 DIAGNOSIS — Z20828 Contact with and (suspected) exposure to other viral communicable diseases: Secondary | ICD-10-CM | POA: Diagnosis not present

## 2019-08-07 DIAGNOSIS — E1151 Type 2 diabetes mellitus with diabetic peripheral angiopathy without gangrene: Secondary | ICD-10-CM | POA: Diagnosis not present

## 2019-08-07 DIAGNOSIS — E785 Hyperlipidemia, unspecified: Secondary | ICD-10-CM | POA: Insufficient documentation

## 2019-08-07 DIAGNOSIS — E039 Hypothyroidism, unspecified: Secondary | ICD-10-CM | POA: Diagnosis not present

## 2019-08-07 DIAGNOSIS — Z87891 Personal history of nicotine dependence: Secondary | ICD-10-CM | POA: Insufficient documentation

## 2019-08-07 DIAGNOSIS — D696 Thrombocytopenia, unspecified: Secondary | ICD-10-CM | POA: Diagnosis not present

## 2019-08-07 DIAGNOSIS — Z794 Long term (current) use of insulin: Secondary | ICD-10-CM | POA: Diagnosis not present

## 2019-08-07 DIAGNOSIS — Z79899 Other long term (current) drug therapy: Secondary | ICD-10-CM | POA: Diagnosis not present

## 2019-08-07 DIAGNOSIS — I10 Essential (primary) hypertension: Secondary | ICD-10-CM | POA: Insufficient documentation

## 2019-08-07 LAB — CBC
HCT: 37.8 % — ABNORMAL LOW (ref 39.0–52.0)
Hemoglobin: 12.4 g/dL — ABNORMAL LOW (ref 13.0–17.0)
MCH: 27.8 pg (ref 26.0–34.0)
MCHC: 32.8 g/dL (ref 30.0–36.0)
MCV: 84.8 fL (ref 80.0–100.0)
Platelets: 83 10*3/uL — ABNORMAL LOW (ref 150–400)
RBC: 4.46 MIL/uL (ref 4.22–5.81)
RDW: 13.1 % (ref 11.5–15.5)
WBC: 5.1 10*3/uL (ref 4.0–10.5)
nRBC: 0 % (ref 0.0–0.2)

## 2019-08-07 LAB — COMPREHENSIVE METABOLIC PANEL
ALT: 32 U/L (ref 0–44)
AST: 59 U/L — ABNORMAL HIGH (ref 15–41)
Albumin: 3.1 g/dL — ABNORMAL LOW (ref 3.5–5.0)
Alkaline Phosphatase: 88 U/L (ref 38–126)
Anion gap: 11 (ref 5–15)
BUN: 10 mg/dL (ref 6–20)
CO2: 24 mmol/L (ref 22–32)
Calcium: 8.9 mg/dL (ref 8.9–10.3)
Chloride: 97 mmol/L — ABNORMAL LOW (ref 98–111)
Creatinine, Ser: 1.18 mg/dL (ref 0.61–1.24)
GFR calc Af Amer: >60 mL/min (ref >60)
GFR calc non Af Amer: >60 mL/min (ref >60)
Glucose, Bld: 163 mg/dL — ABNORMAL HIGH (ref 70–99)
Potassium: 3.2 mmol/L — ABNORMAL LOW (ref 3.5–5.1)
Sodium: 132 mmol/L — ABNORMAL LOW (ref 135–145)
Total Bilirubin: 0.9 mg/dL (ref 0.3–1.2)
Total Protein: 8.2 g/dL — ABNORMAL HIGH (ref 6.5–8.1)

## 2019-08-07 LAB — URINALYSIS, ROUTINE W REFLEX MICROSCOPIC
Bacteria, UA: NONE SEEN
Bilirubin Urine: NEGATIVE
Glucose, UA: >=500 mg/dL — AB
Ketones, ur: NEGATIVE mg/dL
Leukocytes,Ua: NEGATIVE
Nitrite: NEGATIVE
Protein, ur: NEGATIVE mg/dL
Specific Gravity, Urine: 1.009 (ref 1.005–1.030)
pH: 6 (ref 5.0–8.0)

## 2019-08-07 LAB — APTT: aPTT: 34 seconds (ref 24–36)

## 2019-08-07 LAB — GLUCOSE, CAPILLARY: Glucose-Capillary: 253 mg/dL — ABNORMAL HIGH (ref 70–99)

## 2019-08-07 LAB — SURGICAL PCR SCREEN
MRSA, PCR: NEGATIVE
Staphylococcus aureus: POSITIVE — AB

## 2019-08-07 LAB — SARS CORONAVIRUS 2 (TAT 6-24 HRS): SARS Coronavirus 2: NEGATIVE

## 2019-08-07 LAB — PROTIME-INR
INR: 1.2 (ref 0.8–1.2)
Prothrombin Time: 15.2 seconds (ref 11.4–15.2)

## 2019-08-07 NOTE — Progress Notes (Addendum)
Called office and spoke to [place], [place] to make aware of platelet count. She requested that a text page be sent to Dr. Carlis Abbott using [place] [facility].  Text page sent by PAT RN to make surgeon aware.  Received call back from Dr. Judithe Modest should not need platelets pre-op.

## 2019-08-07 NOTE — Progress Notes (Signed)
Pt called and notified to begin using Mupirocin for Positive staph aureus from nasal pcr swab.  Already has ointment at home and within date of use, no need to call in additional prescription. Pt verbalized understanding of use.

## 2019-08-07 NOTE — Progress Notes (Signed)
PCP: Dr. Evonnie Pat Cardiologist: Denies Hematology: Dr. Flonnie Overman saw 8/25--Pt took 4 day course of Dexamethasone as prescribed, has not had any further follow-up since completion.  EKG: 07/11/19 CXR: 07/11/2019 ECHO: denies Stress Test: denies Cardiac Cath: denies  Plavix last dose 08/05/2019  Fasting Blood Sugar- 140's-150's Checks Blood Sugar once daily Last A1C 07/09/2019= 9.9 Today's CBG 253-- did not have meds this AM, did not eat breakfast this AM--anesthesia aware, no need to see during appt.  Surgery cancelled last month due to a platelet reaction.  Spoke to [place] in the Blood Bank--cannot get T&S today, will have to obtain DOS--blood bank unable to hold T&S due to recent transfusion, would expire in 3 days. Asked to send extra lavender top tube to blood bank during today's lab draw--Krissy aware, extra tube sent.  Patient denies shortness of breath, fever, cough, and chest pain at PAT appointment.  Patient verbalized understanding of instructions provided today at the PAT appointment.  Patient asked to review instructions at home and day of surgery.

## 2019-08-08 NOTE — Progress Notes (Signed)
Anesthesia Chart Review:  Case: [id number] Date/Time: 08/11/19 0715   Procedure: BYPASS GRAFT FEMORAL-DISTAL POPLITEAL ARTERY (Left )   Anesthesia type: General   Pre-op diagnosis: CLAUDICATION   Location: [facility] OR ROOM 16 / [place] OR   Surgeon: [name], MD      DISCUSSION: Patient is a 53 year old male scheduled for the above procedure. Surgery was initially scheduled for 06/02/19, but it was postponed until he could under further hematology work-up (by Dr. Delton Coombes) for thrombocytopenia. Surgery was rescheduled for 07/11/19 with plan for 1 unit of platelets in [facility] which was done, but unfortunately, patient developed a suspected platelet transfusion reaction with hypoxemia (sats into the 40's), dyspnea, confusion, diaphoresis, nausea, and transient chest pain. He was treated with Pepcid, IV Benadryl and Solu-Medrol and sent to the ED for further evaluation. High sensitivity Troponin I 13->12 (normal), so ACS not suspected. Patient notably improved in the ED and discharged home with instructions to contact surgeon to reschedule surgery. (In my conversation with [facility] nursing staff there on 07/11/19, plan to infuse platelets at a slower rate if requires future transfusion.)   According to 06/24/19 note by hematologist Dr. Delton Coombes, patient without known bleeding issues; had prior heavy alcohol use (reportedly quit 11/2018); nutritional deficiencies ruled out (Rheumatoid factor elevated at 124 but no clinical symptoms), 05/30/19 liver US showed mild changes consistent with early cirrhosis (normal spleen size). Differential diagnosis included immune thrombocytopenia, so he was given a trial of dexamethasone on 06/22/2019 for 4 days, but no real response to steroid as repeat platelet count stable at 48K. He recommended:  - Recommend giving 1 unit of platelets if there is any bleeding or oozing more than anticipated during or after surgery.  However, since 08/08/19 platelet count had improved to 83K  (previously 45-63K 07/2019), Dr. Carlis Abbott did not think patient would require preoperative platelets prior to 08/11/19 surgery. He will still get a T&S on the day of surgery to be sent to Blood Bank (could not be done at PAT due to recent transfusion history.)   Other history includes former smoker(quit 01/25/19), PVD(s/p bilateral CIA stents 02/06/19), thrombocytopenia,HTN, HLD, DM2 (with history of non-ketotic hypersosmolar syndrome), hypothyroidism, syncope.  - Hospitalization at [facility] 01/14/19-12/2018 after syncopal episode and falling off porch. Labs showed Glucose 725, Na 117, Cr 2.61, PLT 47K, ALT 70, AST 87 and was admitted with primary diagnoses with nonketotic hyperosmolar hyperglycemia, hypernatremia, and acute renal failure. Apparently was a known diabeticat that time with prior A1c of 15.0. He had on-going out-patient follow-up with Dr. Sherrie Sport since his discharge with improvement in his A1c to 6.3%.(Unfortunately, A1c up to 9.9% on 07/09/19.)  Last Plavix 08/05/19. A1c 9.9% on 07/09/19 previously called to surgeon. Currently, patient reported home fasting CBGs 140-150's. He denied SOB, cough, fever, and chest pain at PAT RN visit.  Reviewed updated information including labs/PLT reaction/ED visit/EKG with anesthesiologist Myrtie Soman, MD. No additional recommendations at this time (no plan for repeat EKG, CBC on arrival the day of surgery unless with new concerns). Anesthesia team to evaluate on arrival. 08/07/19 COVID-19 test negative.    VS: BP 140/69   Pulse 92   Temp 37.1 C   Resp 20   Ht 6\' 1"  (1.854 m)   Wt 98.5 kg   SpO2 95%   BMI 28.64 kg/m    PROVIDERS: Neale Burly, MD is PCP   LABS: Preoperative labs noted. AST mildly elevated at 59, consistent with previous results. Glucose 163. Cr 1.18. PLT low, but  improved to 83K. PT/PTT WNL. H/H 12.4/37.8. (all labs ordered are listed, but only abnormal results are displayed)  Labs Reviewed  SURGICAL PCR SCREEN -  Abnormal; Notable for the following components:      Result Value   Staphylococcus aureus POSITIVE (*)    All other components within normal limits  GLUCOSE, CAPILLARY - Abnormal; Notable for the following components:   Glucose-Capillary 253 (*)    All other components within normal limits  CBC - Abnormal; Notable for the following components:   Hemoglobin 12.4 (*)    HCT 37.8 (*)    Platelets 83 (*)    All other components within normal limits  COMPREHENSIVE METABOLIC PANEL - Abnormal; Notable for the following components:   Sodium 132 (*)    Potassium 3.2 (*)    Chloride 97 (*)    Glucose, Bld 163 (*)    Total Protein 8.2 (*)    Albumin 3.1 (*)    AST 59 (*)    All other components within normal limits  URINALYSIS, ROUTINE W REFLEX MICROSCOPIC - Abnormal; Notable for the following components:   Glucose, UA >=500 (*)    Hgb urine dipstick SMALL (*)    All other components within normal limits  APTT  PROTIME-INR     IMAGES: US Liver 06/17/19: IMPRESSION: - No evidence of hepatic, portal or splenic venous thrombosis or occlusion. - Mild findings are noted suggesting hepatic cirrhosis. No significant focal sonographic hepatic abnormality is noted. Recannulized umbilical vein is noted.    EKG: EKG 07/11/19: Sinus rhythm Consider left atrial enlargement Borderline prolonged QT interval  (QT/QTc 407/490 ms) no acute ST/T changes Confirmed by Sherwood Gambler [id number]) on 07/11/2019 9:19:26 AM - ST elevation noted in V2 (in setting of platelet transfusion reaction; high sensitivity Troponin I WNL x2)  EKG 02/06/19: Normal sinus rhythm Early repolarization Normal ECG No old tracing to compare Confirmed by Glori Bickers [phone]) on 02/06/2019 8:33:59 PM - Overall, I think tracing is stable when compared to 01/14/19 tracing from [facility]. When I spoke with him on 05/30/19, he denied chest pain, SOB. He is not very active due to his claudication/rest pain. 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 Denied known CAD/MI.    [id number] prior cath, stress, echo.   Past Medical History:  Diagnosis Date  . Acute renal failure (ARF) ([place])    12/2018 when admitted for hyperosmolar hyperglycemic nonketotic syndrome  . Bilateral pain of leg and foot   . Diabetes mellitus without complication ([place])    Type II  . Dizziness   . Elevated LFTs 05/2019  . Frequent urination   . Hyperglycemia    Non-Ketotic Hyperosmolar.  . Hyperlipidemia   . Hypernatremia   . Hypertension   . Peripheral vascular disease ([place])   . Syncope    12/2018, diagnosed with hyperosmolar hyperglycemic nonketotic syndrome, AKI, hyponatremia ([facility])  . Thrombocytopenia ([place])   . Thyroid disease    Hypothyroidism  . Weakness     Past Surgical History:  Procedure Laterality Date  . ABDOMINAL AORTOGRAM W/LOWER EXTREMITY N/A 02/06/2019   Procedure: ABDOMINAL AORTOGRAM W/LOWER EXTREMITY;  Surgeon: [name], MD;  Location: [place] [facility];  Service: Cardiovascular;  Laterality: N/A;  bilateral  . ABDOMINAL AORTOGRAM W/LOWER EXTREMITY N/A 04/30/2019   Procedure: ABDOMINAL AORTOGRAM W/LOWER EXTREMITY;  Surgeon: [name], MD;  Location: [place] [facility];  Service: Cardiovascular;  Laterality: N/A;  . MULTIPLE TOOTH EXTRACTIONS    .  PERIPHERAL VASCULAR INTERVENTION  02/06/2019   Procedure: PERIPHERAL VASCULAR INTERVENTION;  Surgeon: [name], MD;  Location: [place] [facility];  Service: Cardiovascular;;  Bilateral Iliacs  . PERIPHERAL VASCULAR INTERVENTION  04/30/2019   Procedure: PERIPHERAL VASCULAR INTERVENTION;  Surgeon: [name], MD;  Location: [place] [facility];  Service: Cardiovascular;;  Stent - Lt. Iliac     MEDICATIONS: . Accu-Chek FastClix Lancets MISC  . acetaminophen (TYLENOL) 500 MG tablet  . atorvastatin (LIPITOR) 80 MG tablet  . chlorthalidone (HYGROTON) 50 MG tablet  . clopidogrel (PLAVIX) 75 MG  tablet  . diphenhydrAMINE (BENADRYL) 25 MG tablet  . empagliflozin (JARDIANCE) 10 MG TABS tablet  . gabapentin (NEURONTIN) 300 MG capsule  . glipiZIDE (GLUCOTROL) 5 MG tablet  . Insulin Glargine (BASAGLAR KWIKPEN) 100 UNIT/ML SOPN  . labetalol (NORMODYNE) 300 MG tablet  . NIFEdipine (PROCARDIA XL/NIFEDICAL-XL) 90 MG 24 hr tablet   No current facility-administered medications for this encounter.     Myra Gianotti, PA-C Surgical Short Stay/Anesthesiology [facility] Phone [phone] [facility] Phone [phone] 08/08/2019 2:19 PM

## 2019-08-10 NOTE — Anesthesia Preprocedure Evaluation (Addendum)
Anesthesia Evaluation  Patient identified by MRN, date of birth, ID band Patient awake    Reviewed: Allergy & Precautions, NPO status , Patient's Chart, lab work & pertinent test results, reviewed documented beta blocker date and time   Airway Mallampati: II  TM Distance: >3 FB     Dental  (+) Dental Advisory Given, Edentulous Upper, Edentulous Lower   Pulmonary Patient abstained from smoking., former smoker,    breath sounds clear to auscultation       Cardiovascular hypertension, Pt. on home beta blockers + Peripheral Vascular Disease   Rhythm:Regular Rate:Normal     Neuro/Psych    GI/Hepatic negative GI ROS, Neg liver ROS,   Endo/Other  diabetes, Poorly Controlled  Renal/GU Renal disease     Musculoskeletal   Abdominal   Peds  Hematology   Anesthesia Other Findings   Reproductive/Obstetrics                           Anesthesia Physical  Anesthesia Plan  ASA: III  Anesthesia Plan: General   Post-op Pain Management:    Induction: Intravenous  PONV Risk Score and Plan: 3 and Ondansetron, Dexamethasone, Midazolam and Treatment may vary due to age or medical condition  Airway Management Planned: Oral ETT  Additional Equipment:   Intra-op Plan:   Post-operative Plan: Possible Post-op intubation/ventilation  Informed Consent: I have reviewed the patients History and Physical, chart, labs and discussed the procedure including the risks, benefits and alternatives for the proposed anesthesia with the patient or authorized representative who has indicated his/her understanding and acceptance.     Dental advisory given  Plan Discussed with: CRNA, Anesthesiologist and Surgeon  Anesthesia Plan Comments: (Note from Myra Gianotti: See PAT note written 08/08/2019 by Myra Gianotti, PA-C. Surgery rescheduled following suspected platelet transfusion reaction on 07/11/19. 08/07/19 PLT 83K,  up from (45-63K), based on PLT count 83K, Mr. Peretti does not need pre-op platelets per Dr. Carlis Abbott.   )        Anesthesia Quick Evaluation

## 2019-08-11 ENCOUNTER — Inpatient Hospital Stay (HOSPITAL_COMMUNITY)
Admission: RE | Admit: 2019-08-11 | Discharge: 2019-09-10 | DRG: 252 | Disposition: A | Discharge status: Home Health | Payer: Managed Care, Other (non HMO) | Attending: Vascular Surgery | Admitting: Vascular Surgery

## 2019-08-11 ENCOUNTER — Inpatient Hospital Stay (HOSPITAL_COMMUNITY): Payer: Managed Care, Other (non HMO) | Admitting: Anesthesiology

## 2019-08-11 ENCOUNTER — Other Ambulatory Visit: Payer: Self-pay

## 2019-08-11 ENCOUNTER — Inpatient Hospital Stay (HOSPITAL_COMMUNITY): Payer: Managed Care, Other (non HMO) | Admitting: Physician Assistant

## 2019-08-11 ENCOUNTER — Encounter (HOSPITAL_COMMUNITY): Payer: Self-pay | Admitting: Surgery

## 2019-08-11 ENCOUNTER — Inpatient Hospital Stay (HOSPITAL_COMMUNITY): Payer: Managed Care, Other (non HMO)

## 2019-08-11 ENCOUNTER — Encounter (HOSPITAL_COMMUNITY)
Admission: RE | Disposition: A | Discharge status: Home Health | Payer: Self-pay | Source: Home / Self Care | Attending: Vascular Surgery

## 2019-08-11 DIAGNOSIS — I953 Hypotension of hemodialysis: Secondary | ICD-10-CM | POA: Diagnosis not present

## 2019-08-11 DIAGNOSIS — Z781 Physical restraint status: Secondary | ICD-10-CM

## 2019-08-11 DIAGNOSIS — E1151 Type 2 diabetes mellitus with diabetic peripheral angiopathy without gangrene: Secondary | ICD-10-CM | POA: Diagnosis present

## 2019-08-11 DIAGNOSIS — F05 Delirium due to known physiological condition: Secondary | ICD-10-CM | POA: Diagnosis not present

## 2019-08-11 DIAGNOSIS — Z20828 Contact with and (suspected) exposure to other viral communicable diseases: Secondary | ICD-10-CM | POA: Diagnosis present

## 2019-08-11 DIAGNOSIS — F17200 Nicotine dependence, unspecified, uncomplicated: Secondary | ICD-10-CM | POA: Diagnosis present

## 2019-08-11 DIAGNOSIS — Z23 Encounter for immunization: Secondary | ICD-10-CM | POA: Diagnosis not present

## 2019-08-11 DIAGNOSIS — E785 Hyperlipidemia, unspecified: Secondary | ICD-10-CM | POA: Diagnosis present

## 2019-08-11 DIAGNOSIS — J9600 Acute respiratory failure, unspecified whether with hypoxia or hypercapnia: Secondary | ICD-10-CM

## 2019-08-11 DIAGNOSIS — Z95828 Presence of other vascular implants and grafts: Secondary | ICD-10-CM

## 2019-08-11 DIAGNOSIS — G9340 Encephalopathy, unspecified: Secondary | ICD-10-CM

## 2019-08-11 DIAGNOSIS — Z794 Long term (current) use of insulin: Secondary | ICD-10-CM

## 2019-08-11 DIAGNOSIS — R042 Hemoptysis: Secondary | ICD-10-CM | POA: Diagnosis not present

## 2019-08-11 DIAGNOSIS — I8510 Secondary esophageal varices without bleeding: Secondary | ICD-10-CM | POA: Diagnosis present

## 2019-08-11 DIAGNOSIS — R0602 Shortness of breath: Secondary | ICD-10-CM

## 2019-08-11 DIAGNOSIS — E872 Acidosis: Secondary | ICD-10-CM | POA: Diagnosis present

## 2019-08-11 DIAGNOSIS — E871 Hypo-osmolality and hyponatremia: Secondary | ICD-10-CM | POA: Diagnosis not present

## 2019-08-11 DIAGNOSIS — J189 Pneumonia, unspecified organism: Secondary | ICD-10-CM | POA: Diagnosis not present

## 2019-08-11 DIAGNOSIS — D62 Acute posthemorrhagic anemia: Secondary | ICD-10-CM | POA: Diagnosis not present

## 2019-08-11 DIAGNOSIS — F1010 Alcohol abuse, uncomplicated: Secondary | ICD-10-CM | POA: Diagnosis present

## 2019-08-11 DIAGNOSIS — I739 Peripheral vascular disease, unspecified: Secondary | ICD-10-CM | POA: Diagnosis present

## 2019-08-11 DIAGNOSIS — I70222 Atherosclerosis of native arteries of extremities with rest pain, left leg: Principal | ICD-10-CM | POA: Diagnosis present

## 2019-08-11 DIAGNOSIS — E039 Hypothyroidism, unspecified: Secondary | ICD-10-CM | POA: Diagnosis present

## 2019-08-11 DIAGNOSIS — Z7902 Long term (current) use of antithrombotics/antiplatelets: Secondary | ICD-10-CM

## 2019-08-11 DIAGNOSIS — T17908A Unspecified foreign body in respiratory tract, part unspecified causing other injury, initial encounter: Secondary | ICD-10-CM

## 2019-08-11 DIAGNOSIS — M7989 Other specified soft tissue disorders: Secondary | ICD-10-CM | POA: Diagnosis not present

## 2019-08-11 DIAGNOSIS — K920 Hematemesis: Secondary | ICD-10-CM | POA: Diagnosis not present

## 2019-08-11 DIAGNOSIS — Z419 Encounter for procedure for purposes other than remedying health state, unspecified: Secondary | ICD-10-CM

## 2019-08-11 DIAGNOSIS — I998 Other disorder of circulatory system: Secondary | ICD-10-CM | POA: Diagnosis present

## 2019-08-11 DIAGNOSIS — K922 Gastrointestinal hemorrhage, unspecified: Secondary | ICD-10-CM

## 2019-08-11 DIAGNOSIS — K7030 Alcoholic cirrhosis of liver without ascites: Secondary | ICD-10-CM | POA: Diagnosis present

## 2019-08-11 DIAGNOSIS — T508X5A Adverse effect of diagnostic agents, initial encounter: Secondary | ICD-10-CM | POA: Diagnosis not present

## 2019-08-11 DIAGNOSIS — R4182 Altered mental status, unspecified: Secondary | ICD-10-CM | POA: Diagnosis not present

## 2019-08-11 DIAGNOSIS — J9601 Acute respiratory failure with hypoxia: Secondary | ICD-10-CM | POA: Diagnosis not present

## 2019-08-11 DIAGNOSIS — N186 End stage renal disease: Secondary | ICD-10-CM | POA: Diagnosis present

## 2019-08-11 DIAGNOSIS — D696 Thrombocytopenia, unspecified: Secondary | ICD-10-CM | POA: Diagnosis present

## 2019-08-11 DIAGNOSIS — E1165 Type 2 diabetes mellitus with hyperglycemia: Secondary | ICD-10-CM | POA: Diagnosis present

## 2019-08-11 DIAGNOSIS — R197 Diarrhea, unspecified: Secondary | ICD-10-CM | POA: Diagnosis not present

## 2019-08-11 DIAGNOSIS — E876 Hypokalemia: Secondary | ICD-10-CM | POA: Diagnosis not present

## 2019-08-11 DIAGNOSIS — J9690 Respiratory failure, unspecified, unspecified whether with hypoxia or hypercapnia: Secondary | ICD-10-CM

## 2019-08-11 DIAGNOSIS — N141 Nephropathy induced by other drugs, medicaments and biological substances: Secondary | ICD-10-CM | POA: Diagnosis not present

## 2019-08-11 DIAGNOSIS — E8770 Fluid overload, unspecified: Secondary | ICD-10-CM | POA: Diagnosis not present

## 2019-08-11 DIAGNOSIS — G92 Toxic encephalopathy: Secondary | ICD-10-CM | POA: Diagnosis not present

## 2019-08-11 DIAGNOSIS — L8990 Pressure ulcer of unspecified site, unspecified stage: Secondary | ICD-10-CM | POA: Insufficient documentation

## 2019-08-11 DIAGNOSIS — I120 Hypertensive chronic kidney disease with stage 5 chronic kidney disease or end stage renal disease: Secondary | ICD-10-CM | POA: Diagnosis present

## 2019-08-11 DIAGNOSIS — Z452 Encounter for adjustment and management of vascular access device: Secondary | ICD-10-CM

## 2019-08-11 DIAGNOSIS — Z79899 Other long term (current) drug therapy: Secondary | ICD-10-CM

## 2019-08-11 DIAGNOSIS — N179 Acute kidney failure, unspecified: Secondary | ICD-10-CM | POA: Diagnosis not present

## 2019-08-11 DIAGNOSIS — R040 Epistaxis: Secondary | ICD-10-CM | POA: Diagnosis not present

## 2019-08-11 DIAGNOSIS — N170 Acute kidney failure with tubular necrosis: Secondary | ICD-10-CM | POA: Diagnosis not present

## 2019-08-11 DIAGNOSIS — Z978 Presence of other specified devices: Secondary | ICD-10-CM

## 2019-08-11 DIAGNOSIS — J810 Acute pulmonary edema: Secondary | ICD-10-CM | POA: Diagnosis not present

## 2019-08-11 DIAGNOSIS — K7290 Hepatic failure, unspecified without coma: Secondary | ICD-10-CM | POA: Diagnosis not present

## 2019-08-11 DIAGNOSIS — E1122 Type 2 diabetes mellitus with diabetic chronic kidney disease: Secondary | ICD-10-CM | POA: Diagnosis present

## 2019-08-11 DIAGNOSIS — Z4659 Encounter for fitting and adjustment of other gastrointestinal appliance and device: Secondary | ICD-10-CM

## 2019-08-11 DIAGNOSIS — N320 Bladder-neck obstruction: Secondary | ICD-10-CM | POA: Diagnosis present

## 2019-08-11 DIAGNOSIS — R34 Anuria and oliguria: Secondary | ICD-10-CM | POA: Diagnosis not present

## 2019-08-11 HISTORY — PX: AORTOGRAM: SHX6300

## 2019-08-11 HISTORY — PX: FEMORAL-POPLITEAL BYPASS GRAFT: SHX937

## 2019-08-11 LAB — CBC
HCT: 29.4 % — ABNORMAL LOW (ref 39.0–52.0)
Hemoglobin: 10.6 g/dL — ABNORMAL LOW (ref 13.0–17.0)
MCH: 29.9 pg (ref 26.0–34.0)
MCHC: 36.1 g/dL — ABNORMAL HIGH (ref 30.0–36.0)
MCV: 82.8 fL (ref 80.0–100.0)
Platelets: 72 10*3/uL — ABNORMAL LOW (ref 150–400)
RBC: 3.55 MIL/uL — ABNORMAL LOW (ref 4.22–5.81)
RDW: 13.4 % (ref 11.5–15.5)
WBC: 18.3 10*3/uL — ABNORMAL HIGH (ref 4.0–10.5)
nRBC: 0 % (ref 0.0–0.2)

## 2019-08-11 LAB — HEMOGLOBIN A1C
Hgb A1c MFr Bld: 8.5 % — ABNORMAL HIGH (ref 4.8–5.6)
Mean Plasma Glucose: 197.25 mg/dL

## 2019-08-11 LAB — CBC WITH DIFFERENTIAL/PLATELET
Abs Immature Granulocytes: 0.08 10*3/uL — ABNORMAL HIGH (ref 0.00–0.07)
Basophils Absolute: 0 10*3/uL (ref 0.0–0.1)
Basophils Relative: 0 %
Eosinophils Absolute: 0.1 10*3/uL (ref 0.0–0.5)
Eosinophils Relative: 1 %
HCT: 22.1 % — ABNORMAL LOW (ref 39.0–52.0)
Hemoglobin: 7.5 g/dL — ABNORMAL LOW (ref 13.0–17.0)
Immature Granulocytes: 1 %
Lymphocytes Relative: 13 %
Lymphs Abs: 1.2 10*3/uL (ref 0.7–4.0)
MCH: 29 pg (ref 26.0–34.0)
MCHC: 33.9 g/dL (ref 30.0–36.0)
MCV: 85.3 fL (ref 80.0–100.0)
Monocytes Absolute: 0.3 10*3/uL (ref 0.1–1.0)
Monocytes Relative: 3 %
Neutro Abs: 7.3 10*3/uL (ref 1.7–7.7)
Neutrophils Relative %: 82 %
Platelets: 55 10*3/uL — ABNORMAL LOW (ref 150–400)
RBC: 2.59 MIL/uL — ABNORMAL LOW (ref 4.22–5.81)
RDW: 13.2 % (ref 11.5–15.5)
WBC: 9 10*3/uL (ref 4.0–10.5)
nRBC: 0 % (ref 0.0–0.2)

## 2019-08-11 LAB — GLUCOSE, CAPILLARY
Glucose-Capillary: 156 mg/dL — ABNORMAL HIGH (ref 70–99)
Glucose-Capillary: 226 mg/dL — ABNORMAL HIGH (ref 70–99)
Glucose-Capillary: 189 mg/dL — ABNORMAL HIGH (ref 70–99)
Glucose-Capillary: 189 mg/dL — ABNORMAL HIGH (ref 70–99)

## 2019-08-11 LAB — PREPARE RBC (CROSSMATCH)

## 2019-08-11 LAB — CREATININE, SERUM
Creatinine, Ser: 1.78 mg/dL — ABNORMAL HIGH (ref 0.61–1.24)
GFR calc Af Amer: 49 mL/min — ABNORMAL LOW (ref >60)
GFR calc non Af Amer: 42 mL/min — ABNORMAL LOW (ref >60)

## 2019-08-11 SURGERY — BYPASS GRAFT FEMORAL-POPLITEAL ARTERY
Anesthesia: General | Site: Leg Upper | Laterality: Left

## 2019-08-11 MED ORDER — ALUM & MAG HYDROXIDE-SIMETH 200-200-20 MG/5ML PO SUSP: 15.0000 mL | ORAL | Status: DC | PRN

## 2019-08-11 MED ORDER — ACETAMINOPHEN 325 MG PO TABS
325.0000 mg | ORAL_TABLET | ORAL | Status: DC | PRN
  Administered 2019-08-16 – 2019-08-19 (×2): 650 mg via ORAL
  Filled 2019-08-11 (×2): qty 2

## 2019-08-11 MED ORDER — SODIUM CHLORIDE 0.9% IV SOLUTION: Freq: Once | INTRAVENOUS | Status: DC

## 2019-08-11 MED ORDER — SUGAMMADEX SODIUM 200 MG/2ML IV SOLN
INTRAVENOUS | Status: DC | PRN
  Administered 2019-08-11: 200 mg via INTRAVENOUS

## 2019-08-11 MED ORDER — IODIXANOL 320 MG/ML IV SOLN
INTRAVENOUS | Status: DC | PRN
  Administered 2019-08-11: 40 mL

## 2019-08-11 MED ORDER — HEPARIN SODIUM (PORCINE) 5000 UNIT/ML IJ SOLN
5000.0000 [IU] | Freq: Three times a day (TID) | INTRAMUSCULAR | Status: DC
  Administered 2019-08-11 – 2019-08-15 (×11): 5000 [IU] via SUBCUTANEOUS
  Filled 2019-08-11 (×10): qty 1

## 2019-08-11 MED ORDER — FLEET ENEMA 7-19 GM/118ML RE ENEM: 1.0000 | ENEMA | Freq: Once | RECTAL | Status: DC | PRN

## 2019-08-11 MED ORDER — BISACODYL 5 MG PO TBEC
5.0000 mg | DELAYED_RELEASE_TABLET | Freq: Every day | ORAL | Status: DC | PRN
  Administered 2019-08-14 – 2019-08-21 (×5): 5 mg via ORAL
  Filled 2019-08-11 (×6): qty 1

## 2019-08-11 MED ORDER — PROPOFOL 10 MG/ML IV BOLUS
INTRAVENOUS | Status: AC
  Filled 2019-08-11: qty 20

## 2019-08-11 MED ORDER — HYDRALAZINE HCL 20 MG/ML IJ SOLN: 5.0000 mg | INTRAMUSCULAR | Status: DC | PRN

## 2019-08-11 MED ORDER — FENTANYL CITRATE (PF) 100 MCG/2ML IJ SOLN
INTRAMUSCULAR | Status: AC
  Administered 2019-08-11: 50 ug via INTRAVENOUS
  Filled 2019-08-11: qty 2

## 2019-08-11 MED ORDER — DIPHENHYDRAMINE HCL 25 MG PO CAPS: 25.0000 mg | ORAL_CAPSULE | Freq: Every day | ORAL | Status: DC | PRN

## 2019-08-11 MED ORDER — GUAIFENESIN-DM 100-10 MG/5ML PO SYRP: 15.0000 mL | ORAL_SOLUTION | ORAL | Status: DC | PRN

## 2019-08-11 MED ORDER — LABETALOL HCL 5 MG/ML IV SOLN: 10.0000 mg | INTRAVENOUS | Status: DC | PRN

## 2019-08-11 MED ORDER — METOPROLOL TARTRATE 5 MG/5ML IV SOLN: 2.0000 mg | INTRAVENOUS | Status: DC | PRN

## 2019-08-11 MED ORDER — PROTAMINE SULFATE 10 MG/ML IV SOLN
INTRAVENOUS | Status: DC | PRN
  Administered 2019-08-11: 10 mg via INTRAVENOUS
  Administered 2019-08-11: 40 mg via INTRAVENOUS

## 2019-08-11 MED ORDER — PANTOPRAZOLE SODIUM 40 MG PO TBEC
40.0000 mg | DELAYED_RELEASE_TABLET | Freq: Every day | ORAL | Status: DC
  Administered 2019-08-11 – 2019-08-22 (×11): 40 mg via ORAL
  Filled 2019-08-11 (×12): qty 1

## 2019-08-11 MED ORDER — ASPIRIN EC 81 MG PO TBEC
81.0000 mg | DELAYED_RELEASE_TABLET | Freq: Every day | ORAL | Status: DC
  Administered 2019-08-12 – 2019-08-17 (×6): 81 mg via ORAL
  Filled 2019-08-11 (×6): qty 1

## 2019-08-11 MED ORDER — ROCURONIUM BROMIDE 10 MG/ML (PF) SYRINGE
PREFILLED_SYRINGE | INTRAVENOUS | Status: AC
  Filled 2019-08-11: qty 10

## 2019-08-11 MED ORDER — PHENOL 1.4 % MT LIQD: 1.0000 | OROMUCOSAL | Status: DC | PRN

## 2019-08-11 MED ORDER — CEFAZOLIN SODIUM-DEXTROSE 2-4 GM/100ML-% IV SOLN
2.0000 g | Freq: Three times a day (TID) | INTRAVENOUS | Status: AC
  Administered 2019-08-11 – 2019-08-12 (×2): 2 g via INTRAVENOUS
  Filled 2019-08-11 (×2): qty 100

## 2019-08-11 MED ORDER — MIDAZOLAM HCL 2 MG/2ML IJ SOLN
INTRAMUSCULAR | Status: AC
  Filled 2019-08-11: qty 2

## 2019-08-11 MED ORDER — EPHEDRINE 5 MG/ML INJ
INTRAVENOUS | Status: AC
  Filled 2019-08-11: qty 10

## 2019-08-11 MED ORDER — FENTANYL CITRATE (PF) 250 MCG/5ML IJ SOLN
INTRAMUSCULAR | Status: AC
  Filled 2019-08-11: qty 5

## 2019-08-11 MED ORDER — MAGNESIUM SULFATE 2 GM/50ML IV SOLN
2.0000 g | Freq: Every day | INTRAVENOUS | Status: AC | PRN
  Administered 2019-09-04: 09:00:00 2 g via INTRAVENOUS
  Filled 2019-08-11: qty 50

## 2019-08-11 MED ORDER — CLOPIDOGREL BISULFATE 75 MG PO TABS
75.0000 mg | ORAL_TABLET | Freq: Every day | ORAL | Status: DC
  Administered 2019-08-11 – 2019-08-21 (×11): 75 mg via ORAL
  Filled 2019-08-11 (×12): qty 1

## 2019-08-11 MED ORDER — HYDROMORPHONE HCL 1 MG/ML IJ SOLN
0.5000 mg | INTRAMUSCULAR | Status: DC | PRN
  Administered 2019-08-19: 1 mg via INTRAVENOUS
  Filled 2019-08-11: qty 1

## 2019-08-11 MED ORDER — LIDOCAINE 2% (20 MG/ML) 5 ML SYRINGE
INTRAMUSCULAR | Status: AC
  Filled 2019-08-11: qty 5

## 2019-08-11 MED ORDER — ALBUMIN HUMAN 5 % IV SOLN
INTRAVENOUS | Status: DC | PRN
  Administered 2019-08-11 (×2): via INTRAVENOUS

## 2019-08-11 MED ORDER — ROCURONIUM BROMIDE 50 MG/5ML IV SOSY
PREFILLED_SYRINGE | INTRAVENOUS | Status: DC | PRN
  Administered 2019-08-11: 30 mg via INTRAVENOUS
  Administered 2019-08-11: 60 mg via INTRAVENOUS

## 2019-08-11 MED ORDER — FENTANYL CITRATE (PF) 100 MCG/2ML IJ SOLN
25.0000 ug | INTRAMUSCULAR | Status: DC | PRN
  Administered 2019-08-11 (×2): 50 ug via INTRAVENOUS

## 2019-08-11 MED ORDER — SODIUM CHLORIDE 0.9 % IV SOLN: 500.0000 mL | Freq: Once | INTRAVENOUS | Status: DC | PRN

## 2019-08-11 MED ORDER — DEXAMETHASONE SODIUM PHOSPHATE 10 MG/ML IJ SOLN
INTRAMUSCULAR | Status: AC
  Filled 2019-08-11: qty 1

## 2019-08-11 MED ORDER — SODIUM CHLORIDE 0.9 % IV SOLN
INTRAVENOUS | Status: DC | PRN
  Administered 2019-08-11: 500 mL

## 2019-08-11 MED ORDER — SODIUM CHLORIDE 0.9 % IV SOLN
INTRAVENOUS | Status: AC
  Filled 2019-08-11: qty 1.2

## 2019-08-11 MED ORDER — ALBUTEROL SULFATE HFA 108 (90 BASE) MCG/ACT IN AERS
INHALATION_SPRAY | RESPIRATORY_TRACT | Status: DC | PRN
  Administered 2019-08-11 (×2): 4 via RESPIRATORY_TRACT

## 2019-08-11 MED ORDER — ONDANSETRON HCL 4 MG/2ML IJ SOLN
INTRAMUSCULAR | Status: AC
  Filled 2019-08-11: qty 2

## 2019-08-11 MED ORDER — HEPARIN SODIUM (PORCINE) 1000 UNIT/ML IJ SOLN
INTRAMUSCULAR | Status: DC | PRN
  Administered 2019-08-11: 2000 [IU] via INTRAVENOUS
  Administered 2019-08-11: 10000 [IU] via INTRAVENOUS

## 2019-08-11 MED ORDER — LABETALOL HCL 300 MG PO TABS
300.0000 mg | ORAL_TABLET | Freq: Two times a day (BID) | ORAL | Status: DC
  Administered 2019-08-11 – 2019-08-17 (×10): 300 mg via ORAL
  Filled 2019-08-11 (×12): qty 1

## 2019-08-11 MED ORDER — ACETAMINOPHEN 325 MG RE SUPP: 325.0000 mg | RECTAL | Status: DC | PRN

## 2019-08-11 MED ORDER — INSULIN ASPART 100 UNIT/ML ~~LOC~~ SOLN
0.0000 [IU] | Freq: Three times a day (TID) | SUBCUTANEOUS | Status: DC
  Administered 2019-08-11: 14:00:00 3 [IU] via SUBCUTANEOUS
  Administered 2019-08-11 – 2019-08-12 (×2): 2 [IU] via SUBCUTANEOUS
  Administered 2019-08-12: 3 [IU] via SUBCUTANEOUS
  Administered 2019-08-12: 16:00:00 2 [IU] via SUBCUTANEOUS
  Administered 2019-08-13: 13:00:00 5 [IU] via SUBCUTANEOUS
  Administered 2019-08-13 – 2019-08-15 (×7): 2 [IU] via SUBCUTANEOUS
  Administered 2019-08-15: 3 [IU] via SUBCUTANEOUS
  Administered 2019-08-16 (×3): 2 [IU] via SUBCUTANEOUS
  Administered 2019-08-17: 1 [IU] via SUBCUTANEOUS
  Administered 2019-08-17: 2 [IU] via SUBCUTANEOUS
  Administered 2019-08-17 – 2019-08-20 (×6): 1 [IU] via SUBCUTANEOUS
  Administered 2019-08-21 (×3): 3 [IU] via SUBCUTANEOUS
  Administered 2019-08-22 (×2): 5 [IU] via SUBCUTANEOUS

## 2019-08-11 MED ORDER — FENTANYL CITRATE (PF) 100 MCG/2ML IJ SOLN
INTRAMUSCULAR | Status: DC | PRN
  Administered 2019-08-11 (×4): 50 ug via INTRAVENOUS

## 2019-08-11 MED ORDER — GABAPENTIN 300 MG PO CAPS
300.0000 mg | ORAL_CAPSULE | Freq: Three times a day (TID) | ORAL | Status: DC
  Administered 2019-08-11 – 2019-08-17 (×17): 300 mg via ORAL
  Filled 2019-08-11 (×15): qty 1
  Filled 2019-08-11: qty 3
  Filled 2019-08-11: qty 1

## 2019-08-11 MED ORDER — PHENYLEPHRINE 40 MCG/ML (10ML) SYRINGE FOR IV PUSH (FOR BLOOD PRESSURE SUPPORT)
PREFILLED_SYRINGE | INTRAVENOUS | Status: AC
  Filled 2019-08-11: qty 10

## 2019-08-11 MED ORDER — SODIUM CHLORIDE 0.9 % IV SOLN
10.0000 mL/h | Freq: Once | INTRAVENOUS | Status: AC
  Administered 2019-08-11: 10 mL/h via INTRAVENOUS

## 2019-08-11 MED ORDER — POTASSIUM CHLORIDE CRYS ER 20 MEQ PO TBCR: 20.0000 meq | EXTENDED_RELEASE_TABLET | Freq: Every day | ORAL | Status: DC | PRN

## 2019-08-11 MED ORDER — CHLORHEXIDINE GLUCONATE CLOTH 2 % EX PADS: 6.0000 | MEDICATED_PAD | Freq: Once | CUTANEOUS | Status: DC

## 2019-08-11 MED ORDER — DEXAMETHASONE SODIUM PHOSPHATE 10 MG/ML IJ SOLN
INTRAMUSCULAR | Status: DC | PRN
  Administered 2019-08-11: 4 mg via INTRAVENOUS

## 2019-08-11 MED ORDER — GLIPIZIDE 5 MG PO TABS
5.0000 mg | ORAL_TABLET | Freq: Two times a day (BID) | ORAL | Status: DC
  Administered 2019-08-11 – 2019-08-17 (×12): 5 mg via ORAL
  Filled 2019-08-11 (×13): qty 1

## 2019-08-11 MED ORDER — 0.9 % SODIUM CHLORIDE (POUR BTL) OPTIME
TOPICAL | Status: DC | PRN
  Administered 2019-08-11: 2000 mL

## 2019-08-11 MED ORDER — DOCUSATE SODIUM 100 MG PO CAPS
100.0000 mg | ORAL_CAPSULE | Freq: Every day | ORAL | Status: DC
  Administered 2019-08-12 – 2019-08-20 (×8): 100 mg via ORAL
  Filled 2019-08-11 (×10): qty 1

## 2019-08-11 MED ORDER — SODIUM CHLORIDE 0.9 % IV SOLN
INTRAVENOUS | Status: DC | PRN
  Administered 2019-08-11: 90 ug/min via INTRAVENOUS

## 2019-08-11 MED ORDER — CHLORTHALIDONE 50 MG PO TABS
50.0000 mg | ORAL_TABLET | Freq: Every day | ORAL | Status: DC
  Administered 2019-08-11 – 2019-08-16 (×6): 50 mg via ORAL
  Filled 2019-08-11 (×6): qty 1

## 2019-08-11 MED ORDER — SODIUM CHLORIDE 0.9 % IV SOLN: INTRAVENOUS | Status: DC

## 2019-08-11 MED ORDER — PROPOFOL 10 MG/ML IV BOLUS
INTRAVENOUS | Status: DC | PRN
  Administered 2019-08-11: 100 mg via INTRAVENOUS

## 2019-08-11 MED ORDER — DIPHENHYDRAMINE HCL 50 MG/ML IJ SOLN
INTRAMUSCULAR | Status: DC | PRN
  Administered 2019-08-11: 12.5 mg via INTRAVENOUS

## 2019-08-11 MED ORDER — MIDAZOLAM HCL 5 MG/5ML IJ SOLN
INTRAMUSCULAR | Status: DC | PRN
  Administered 2019-08-11: 2 mg via INTRAVENOUS

## 2019-08-11 MED ORDER — INSULIN ASPART 100 UNIT/ML ~~LOC~~ SOLN
SUBCUTANEOUS | Status: AC
  Administered 2019-08-11: 14:00:00 3 [IU] via SUBCUTANEOUS
  Filled 2019-08-11: qty 1

## 2019-08-11 MED ORDER — HEMOSTATIC AGENTS (NO CHARGE) OPTIME
TOPICAL | Status: DC | PRN
  Administered 2019-08-11: 3 via TOPICAL

## 2019-08-11 MED ORDER — HEPARIN SODIUM (PORCINE) 1000 UNIT/ML IJ SOLN
INTRAMUSCULAR | Status: AC
  Filled 2019-08-11: qty 1

## 2019-08-11 MED ORDER — LIDOCAINE 2% (20 MG/ML) 5 ML SYRINGE
INTRAMUSCULAR | Status: DC | PRN
  Administered 2019-08-11: 60 mg via INTRAVENOUS

## 2019-08-11 MED ORDER — CANAGLIFLOZIN 100 MG PO TABS
100.0000 mg | ORAL_TABLET | Freq: Every day | ORAL | Status: DC
  Administered 2019-08-12: 100 mg via ORAL
  Filled 2019-08-11: qty 1

## 2019-08-11 MED ORDER — CEFAZOLIN SODIUM-DEXTROSE 2-4 GM/100ML-% IV SOLN
2.0000 g | INTRAVENOUS | Status: AC
  Administered 2019-08-11 (×2): 2 g via INTRAVENOUS
  Filled 2019-08-11: qty 100

## 2019-08-11 MED ORDER — ONDANSETRON HCL 4 MG/2ML IJ SOLN
INTRAMUSCULAR | Status: DC | PRN
  Administered 2019-08-11: 4 mg via INTRAVENOUS

## 2019-08-11 MED ORDER — ONDANSETRON HCL 4 MG/2ML IJ SOLN: 4.0000 mg | Freq: Four times a day (QID) | INTRAMUSCULAR | Status: DC | PRN

## 2019-08-11 MED ORDER — SENNOSIDES-DOCUSATE SODIUM 8.6-50 MG PO TABS
1.0000 | ORAL_TABLET | Freq: Every evening | ORAL | Status: DC | PRN
  Administered 2019-08-14 – 2019-08-20 (×2): 1 via ORAL
  Filled 2019-08-11 (×2): qty 1

## 2019-08-11 MED ORDER — NIFEDIPINE ER OSMOTIC RELEASE 90 MG PO TB24
90.0000 mg | ORAL_TABLET | Freq: Every day | ORAL | Status: DC
  Administered 2019-08-12 – 2019-08-17 (×6): 90 mg via ORAL
  Filled 2019-08-11 (×4): qty 3
  Filled 2019-08-11: qty 1
  Filled 2019-08-11: qty 3
  Filled 2019-08-11: qty 1

## 2019-08-11 MED ORDER — ALBUTEROL SULFATE HFA 108 (90 BASE) MCG/ACT IN AERS
INHALATION_SPRAY | RESPIRATORY_TRACT | Status: AC
  Filled 2019-08-11: qty 6.7

## 2019-08-11 MED ORDER — PHENYLEPHRINE HCL (PRESSORS) 10 MG/ML IV SOLN
INTRAVENOUS | Status: DC | PRN
  Administered 2019-08-11: 80 ug via INTRAVENOUS
  Administered 2019-08-11: 120 ug via INTRAVENOUS
  Administered 2019-08-11: 80 ug via INTRAVENOUS

## 2019-08-11 MED ORDER — ATORVASTATIN CALCIUM 80 MG PO TABS
80.0000 mg | ORAL_TABLET | Freq: Every day | ORAL | Status: DC
  Administered 2019-08-11 – 2019-08-22 (×11): 80 mg via ORAL
  Filled 2019-08-11 (×12): qty 1

## 2019-08-11 MED ORDER — LACTATED RINGERS IV SOLN
INTRAVENOUS | Status: DC | PRN
  Administered 2019-08-11 (×3): via INTRAVENOUS

## 2019-08-11 MED ORDER — OXYCODONE HCL 5 MG PO TABS
5.0000 mg | ORAL_TABLET | ORAL | Status: DC | PRN
  Administered 2019-08-11 – 2019-08-20 (×18): 10 mg via ORAL
  Administered 2019-08-20: 06:00:00 5 mg via ORAL
  Administered 2019-08-20 – 2019-08-21 (×3): 10 mg via ORAL
  Filled 2019-08-11 (×23): qty 2

## 2019-08-11 MED ORDER — INSULIN GLARGINE 100 UNIT/ML ~~LOC~~ SOLN
20.0000 [IU] | Freq: Every evening | SUBCUTANEOUS | Status: DC
  Administered 2019-08-11 – 2019-08-21 (×11): 20 [IU] via SUBCUTANEOUS
  Filled 2019-08-11 (×12): qty 0.2

## 2019-08-11 SURGICAL SUPPLY — 77 items
BAG BANDED W/RUBBER/TAPE 36X54 (MISCELLANEOUS) ×4 IMPLANT
BAG SNAP BAND KOVER 36X36 (MISCELLANEOUS) ×4 IMPLANT
BANDAGE ESMARK 6X9 LF (GAUZE/BANDAGES/DRESSINGS) IMPLANT
BNDG ESMARK 6X9 LF (GAUZE/BANDAGES/DRESSINGS)
CANISTER SUCT 3000ML PPV (MISCELLANEOUS) ×4 IMPLANT
CANNULA VESSEL 3MM 2 BLNT TIP (CANNULA) ×4 IMPLANT
CATH OMNI FLUSH 5F 65CM (CATHETERS) ×4 IMPLANT
CLIP FOGARTY SPRING 6M (CLIP) IMPLANT
CLIP VESOCCLUDE MED 24/CT (CLIP) ×4 IMPLANT
CLIP VESOCCLUDE SM WIDE 24/CT (CLIP) ×4 IMPLANT
COVER PROBE W GEL 5X96 (DRAPES) ×4 IMPLANT
COVER WAND RF STERILE (DRAPES) IMPLANT
CUFF TOURN SGL QUICK 24 (TOURNIQUET CUFF)
CUFF TOURN SGL QUICK 34 (TOURNIQUET CUFF)
CUFF TOURN SGL QUICK 42 (TOURNIQUET CUFF) IMPLANT
CUFF TRNQT CYL 24X4X16.5-23 (TOURNIQUET CUFF) IMPLANT
CUFF TRNQT CYL 34X4.125X (TOURNIQUET CUFF) IMPLANT
DERMABOND ADVANCED (GAUZE/BANDAGES/DRESSINGS) ×8
DERMABOND ADVANCED .7 DNX12 (GAUZE/BANDAGES/DRESSINGS) ×8 IMPLANT
DRAIN CHANNEL 15F RND FF W/TCR (WOUND CARE) IMPLANT
DRAPE C-ARM 42X72 X-RAY (DRAPES) IMPLANT
DRAPE HALF SHEET 40X57 (DRAPES) IMPLANT
ELECT REM PT RETURN 9FT ADLT (ELECTROSURGICAL) ×4
ELECTRODE REM PT RTRN 9FT ADLT (ELECTROSURGICAL) ×2 IMPLANT
EVACUATOR SILICONE 100CC (DRAIN) IMPLANT
GAUZE 4X4 16PLY RFD (DISPOSABLE) ×8 IMPLANT
GLOVE BIO SURGEON STRL SZ7.5 (GLOVE) ×4 IMPLANT
GLOVE BIOGEL PI IND STRL 6.5 (GLOVE) ×8 IMPLANT
GLOVE BIOGEL PI IND STRL 8 (GLOVE) ×6 IMPLANT
GLOVE BIOGEL PI INDICATOR 6.5 (GLOVE) ×8
GLOVE BIOGEL PI INDICATOR 8 (GLOVE) ×6
GLOVE ECLIPSE 7.0 STRL STRAW (GLOVE) ×12 IMPLANT
GLOVE INDICATOR 7.5 STRL GRN (GLOVE) ×12 IMPLANT
GLOVE SURG SS PI 6.5 STRL IVOR (GLOVE) ×4 IMPLANT
GOWN STRL NON-REIN LRG LVL3 (GOWN DISPOSABLE) ×4 IMPLANT
GOWN STRL REUS W/ TWL LRG LVL3 (GOWN DISPOSABLE) ×4 IMPLANT
GOWN STRL REUS W/ TWL XL LVL3 (GOWN DISPOSABLE) ×2 IMPLANT
GOWN STRL REUS W/TWL LRG LVL3 (GOWN DISPOSABLE) ×4
GOWN STRL REUS W/TWL XL LVL3 (GOWN DISPOSABLE) ×2
GRAFT PROPATEN THIN WALL 6X80 (Vascular Products) ×4 IMPLANT
HEMOSTAT SPONGE AVITENE ULTRA (HEMOSTASIS) IMPLANT
INSERT FOGARTY SM (MISCELLANEOUS) IMPLANT
KIT BASIN OR (CUSTOM PROCEDURE TRAY) ×4 IMPLANT
KIT TURNOVER KIT B (KITS) ×4 IMPLANT
MARKER SKIN DUAL TIP RULER LAB (MISCELLANEOUS) ×4 IMPLANT
NS IRRIG 1000ML POUR BTL (IV SOLUTION) ×8 IMPLANT
PACK PERIPHERAL VASCULAR (CUSTOM PROCEDURE TRAY) ×4 IMPLANT
PAD ARMBOARD 7.5X6 YLW CONV (MISCELLANEOUS) ×8 IMPLANT
SET MICROPUNCTURE 5F STIFF (MISCELLANEOUS) ×4 IMPLANT
SHEATH PINNACLE 5F 10CM (SHEATH) ×4 IMPLANT
STOPCOCK 4 WAY LG BORE MALE ST (IV SETS) ×4 IMPLANT
SURGICEL SNOW 2X4 (HEMOSTASIS) ×12 IMPLANT
SUT ETHILON 3 0 PS 1 (SUTURE) IMPLANT
SUT GORETEX 5 0 TT13 24 (SUTURE) IMPLANT
SUT GORETEX 6.0 TT13 (SUTURE) IMPLANT
SUT MNCRL AB 4-0 PS2 18 (SUTURE) ×8 IMPLANT
SUT PROLENE 5 0 C 1 24 (SUTURE) ×8 IMPLANT
SUT PROLENE 6 0 BV (SUTURE) ×40 IMPLANT
SUT PROLENE 7 0 BV 1 (SUTURE) IMPLANT
SUT SILK 2 0 PERMA HAND 18 BK (SUTURE) ×12 IMPLANT
SUT SILK 3 0 (SUTURE) ×4
SUT SILK 3-0 18XBRD TIE 12 (SUTURE) ×4 IMPLANT
SUT SILK 4 0 (SUTURE) ×2
SUT SILK 4-0 18XBRD TIE 12 (SUTURE) ×2 IMPLANT
SUT VIC AB 2-0 CT1 27 (SUTURE) ×4
SUT VIC AB 2-0 CT1 TAPERPNT 27 (SUTURE) ×4 IMPLANT
SUT VIC AB 3-0 SH 27 (SUTURE) ×6
SUT VIC AB 3-0 SH 27X BRD (SUTURE) ×6 IMPLANT
SYR 30ML LL (SYRINGE) ×8 IMPLANT
SYR MEDRAD MARK V 150ML (SYRINGE) ×4 IMPLANT
TAPE UMBILICAL COTTON 1/8X30 (MISCELLANEOUS) IMPLANT
TOWEL GREEN STERILE (TOWEL DISPOSABLE) ×8 IMPLANT
TRAY FOLEY MTR SLVR 16FR STAT (SET/KITS/TRAYS/PACK) ×4 IMPLANT
TUBING EXTENTION W/L.L. (IV SETS) IMPLANT
TUBING INJECTOR 48 (MISCELLANEOUS) ×4 IMPLANT
UNDERPAD 30X30 (UNDERPADS AND DIAPERS) ×4 IMPLANT
WATER STERILE IRR 1000ML POUR (IV SOLUTION) ×4 IMPLANT

## 2019-08-11 NOTE — Transfer of Care (Signed)
Immediate Anesthesia Transfer of Care Note  Patient: Edward Rocha.  Procedure(s) Performed: BYPASS LEFT FEMORAL-DISTAL POPLITEAL ARTERY USING PROPATEN GRAFT (Left Leg Upper) Aortogram  Patient Location: PACU  Anesthesia Type:General  Level of Consciousness: awake and drowsy  Airway & Oxygen Therapy: Patient Spontanous Breathing and Patient connected to face mask oxygen  Post-op Assessment: Report given to RN, Post -op Vital signs reviewed and stable and Patient moving all extremities X 4  Post vital signs: Reviewed and stable  Last Vitals:  Vitals Value Taken Time  BP 119/53 08/11/19 1342  Temp    Pulse 76 08/11/19 1347  Resp 23 08/11/19 1347  SpO2 94 % 08/11/19 1347  Vitals shown include unvalidated device data.  Last Pain:  Vitals:   08/11/19 0636  PainSc: 0-No pain      Patients Stated Pain Goal: 3 ([id number])  Complications: No apparent anesthesia complications

## 2019-08-11 NOTE — Anesthesia Procedure Notes (Signed)
Procedure Name: Intubation Date/Time: 08/11/2019 7:40 AM Performed by: Inda Coke, CRNA Pre-anesthesia Checklist: Patient identified, Emergency Drugs available, Suction available and Patient being monitored Patient Re-evaluated:Patient Re-evaluated prior to induction Oxygen Delivery Method: Circle System Utilized Preoxygenation: Pre-oxygenation with 100% oxygen Induction Type: IV induction Ventilation: Mask ventilation with difficulty, Two handed mask ventilation required and Oral airway inserted - appropriate to patient size Laryngoscope Size: Sabra Heck and 3 Grade View: Grade II Tube type: Oral Tube size: 7.5 mm Number of attempts: 1 Airway Equipment and Method: Stylet and Oral airway Placement Confirmation: ETT inserted through vocal cords under direct vision,  positive ETCO2 and breath sounds checked- equal and bilateral Secured at: 22 cm Tube secured with: Tape Dental Injury: Teeth and Oropharynx as per pre-operative assessment

## 2019-08-11 NOTE — Anesthesia Postprocedure Evaluation (Signed)
Anesthesia Post Note  Patient: Edward Rocha.  Procedure(s) Performed: BYPASS LEFT FEMORAL-DISTAL POPLITEAL ARTERY USING PROPATEN GRAFT (Left Leg Upper) Aortogram     Patient location during evaluation: PACU Anesthesia Type: General Level of consciousness: sedated and patient cooperative Pain management: pain level controlled Vital Signs Assessment: post-procedure vital signs reviewed and stable Respiratory status: spontaneous breathing Cardiovascular status: stable Anesthetic complications: no    Last Vitals:  Vitals:   08/11/19 1615 08/11/19 1630  BP: (!) 142/67 (!) 143/75  Pulse: 73 73  Resp: 13 (!) 8  Temp:    SpO2: 96% 97%    Last Pain:  Vitals:   08/11/19 1600  TempSrc:   PainSc: [place]

## 2019-08-11 NOTE — Progress Notes (Signed)
08/11/2019 1700 Received pt to room 4E-19 from PACU S/P Fem Pop.  Pt is A&O, no C/O voiced.  Tele monitor applied and CCMD notified.  CHG bath given.  Oriented to room, call light and bed.  Call bell in reach. [place]

## 2019-08-11 NOTE — Op Note (Signed)
Date: August 11, 2019  Preoperative diagnosis: Critical limb ischemia of the left lower extremity with severe rest pain and multilevel occlusive disease  Postoperative diagnosis: Same  Procedure: 1.  Aortogram with left iliac arteriogram 2.  Harvest of left great saphenous vein 3.  Left common femoral artery to posterior tibial artery bypass with composite 6 mm PTFE graft and non-reversed great saphenous vein  Surgeon: Dr. [name], MD  Assistant: [name], PA  Indications: Patient is a 53 year old male who previously presented with severe rest pain of his left lower extremity.  He had multilevel occlusive disease and previously underwent common iliac stenting as well as left external iliac stenting with only moderate improvement in his rest pain.  Given evidence of severe infrainiguinal disease with occluded popliteal and tibial trifurcation he now presents for planned left common femoral to posterior tibial bypass after risks and benefits were discussed.  Findings: After cutdown on the left common femoral artery there was a very poor left common femoral artery pulse.  Elected to access the left common femoral artery retrograde and aortogram/left iliac arteriogram was obtained.  The left common iliac as well as the left external iliac stent were widely patent with no other obvious limiting inflow disease.  Ultimately harvest the left great saphenous vein that unfortunately was adequate only from the mid thigh to the proximal knee.  The vein had multiple varicosities throughout its course and was small in the proximal thigh.  The adequate great saphenous vein drained into the deep system in the mid thigh and the proximal thigh great saphenous was not usable.   As a result had to do composite bypass.  Ultimately the 6 mm PTFE graft was sewn end to end to non-reversed great saphenous vein.  This was tunneled anatomic.  Upon completion of anastomosis on posterior tibial artery  patient had a brisk posterior tibial signal on the left.  Anesthesia: General  EBL: 950 mL  Details: Patient was taken to the operating room after informed consent was obtained.  He was placed on operative table in supine position.  His left groin as well as his left leg were prepped and draped in usual sterile fashion.  Initially made a vertical groin incision over his left common femoral artery.  Dissected down with Bovie cautery got circumferential control of the left common femoral artery.  Put Vesseloops proximal and distal.  There was a bit of a weak pulse here so before doing a bypass, I wanted to make sure that the previous iliac stents remained patent.  Accessed the left common femoral artery retrograde with a micro access needle and placed a microwire and then a short micro-sheath.  Used a Bentson wire and then placed a 5 [place] sheath.  Ultimately advanced Omni Flush catheter up in the infrarenal aorta and aortogram left iliac arteriograms obtained.  The left common iliac as well as the left external iliac stent were widely patent with no other obvious flow limiting disease for inflow.  The 5 French sheath was then removed and a 6-0 pursestring was tied down.  I then started harvesting great saphenous vein in the left leg.  This has been marked with ultrasound.  Fortunately the vein was identified preoperatively and was very small in the proximal thigh and it was larger in the mid thigh there appeared to be a draining branch into the deep system here.  Multiple skip incisions were made throughout the left leg.  The vein was then dissected with  Bovie cautery.  All branches were ligated with 3-0 silk ties and divided.  Ultimately when I got down to below the knee the vein was so small that it was no longer usable.  And again the vein up in the proximal thigh was not usable.  I traced it down to the femoral vein in the mid thigh were ligated with a 2-0 silk tie and then brought it out after tying it  off with a 2-0 silk tie distally.  The vein was initially reversed and flushed and several small holes were then repaired with 6-0 Prolenes.  Then we brought a 6 mm graft on the field both ends were spatulated and we performed an end-to-end running anastomosis with 6-0 Prolene to the saphenous vein in non-reversed fashion.  I then tunneled from the popliteal space anatomic subfascial up to the groin subsartorial.  That point in time patient was given 100 units/kg heparin.  I pulled up on vessel loops on the left common femoral artery.  The artery was opened 11 blade scalpel extended with Potts scissors.  I then spatulated the graft and end-to-side anastomosis was sewn to the left common femoral artery with PTFE.  I then brought a valvulotome on the field and all valves were lysed in the great saphenous vein that had been sewn to the composite graft.  We had good pulsatile flow distally.  I then brought my composite graft through the tunnel that is already been placed.  We then tunneled it subfascial when we brought out the below-knee popliteal incision.  Initially we came off clamps there was very poor inflow distally.  As a result I brought the composite graft out again in the groin and had great pulsatile bleeding.  I re-tunneled.  This time on the second tunnel when I retunneled the bypass had much more brisk pulsatile bleeding.  We then previously dissected out the posterior tibial artery in the mid calf where the arteriogram showed that the target came back.  I has taken down soleus off the posterior tibia.  An end to side anastomosis were then performed here with 6-0 Prolene after the vein was pulled to length and spatulated and the PT artery was open longitudinal fashion.  I did pass a #1, #1.5, and #2 dilator distal with no resistance down the PT artery.  Good backbleeding from the artery.  Once we came off clamps there was excellent posterior tibial signal distal that completely augmented with the graft was  clamped.  There was a good posterior tibial signal at the foot.  Patient was given 50 mg protamine for reversal.    He was given 1 unit of platelets at the end of the case given history of thrombocytopenia and he was oozy.  All incisions were then washed out.  I did have bleeding from the toe of my distal anastomosis.  I put a 6-0 Prolene here and used a vein pledget.  I used Surgicel snow for hemostasis.  Everything was washed out and then closed everything with 2-0 Vicryl, 3-0 Vicryl, 4-0 Monocryl in the skin and Dermabond.  To be taken to PACU in stable condition.  Complication: None  Condition: Stable  [name], MD [facility] Office: [phone] Pager: [place]

## 2019-08-11 NOTE — H&P (Signed)
History and Physical Interval Note:  08/11/2019 7:17 AM  Edward Rocha.  has presented today for surgery, with the diagnosis of CLAUDICATION.  The various methods of treatment have been discussed with the patient and family. After consideration of risks, benefits and other options for treatment, the patient has consented to  Procedure(s): BYPASS GRAFT FEMORAL-DISTAL POPLITEAL ARTERY (Left) as a surgical intervention.  The patient's history has been reviewed, patient examined, no change in status, stable for surgery.  I have reviewed the patient's chart and labs.  Questions were answered to the patient's satisfaction.    Left fem PT bypass.  CLI with rest pain.  Platelets improved  - had allergic reaction before and surgery cancelled.  Risks and benefits discussed with patient.  Edward Rocha J Edward Rocha  REASON FOR VISIT:Follow-up after bilateral common iliac stenting and left external iliac stenting.  HPI: Edward Rocha a 53 y.o.malewith history of hypertension, hyperlipidemia, diabetes that presents for follow-up after bilateral common iliac stent placement on 4/9/2020and thenleftexternal iliac stent on 04/30/19.He was initially seen by Dr. Donnetta Hutching on 02/04/2019 with evidence of critical limb ischemia of the bilateral lower extremities with symptoms consistent with rest pain. Ultimately underwent aortogram,lower extremity arteriogram with me on 02/06/2019. At that time I placed bilateral common iliac stents. We took him back for another aortogram given that it is 1 month follow-up they could not visualize his common iliac stents with monophasic runoff in both iliacs. During that time we did identify a left external iliac stenosis that was stented and his previous bilateral kissing stents were not patent.  He continuestocomplain of pain in his left leg. He states no significant provement with the most recent left external iliac stent.      Past Medical History:  Diagnosis Date   . Acute renal failure (ARF) ([place])   . Bilateral pain of leg and foot   . Diabetes mellitus without complication ([place])    Type II  . Dizziness   . Frequent urination   . Hyperglycemia    Non-Ketotic Hyperosmolar.  . Hyperlipidemia   . Hypernatremia   . Hypertension   . Peripheral vascular disease ([place])   . Syncope   . Thrombocytopenia ([place])   . Thyroid disease    Hypothyroidism  . Weakness          Past Surgical History:  Procedure Laterality Date  . ABDOMINAL AORTOGRAM W/LOWER EXTREMITY N/A 02/06/2019   Procedure: ABDOMINAL AORTOGRAM W/LOWER EXTREMITY; Surgeon: [name], MD; Location: [place] [facility]; Service: Cardiovascular; Laterality: N/A; bilateral  . ABDOMINAL AORTOGRAM W/LOWER EXTREMITY N/A 04/30/2019   Procedure: ABDOMINAL AORTOGRAM W/LOWER EXTREMITY; Surgeon: [name], MD; Location: [place] [facility]; Service: Cardiovascular; Laterality: N/A;  . PERIPHERAL VASCULAR INTERVENTION  02/06/2019   Procedure: PERIPHERAL VASCULAR INTERVENTION; Surgeon: [name], MD; Location: [facility] [facility]; Service: Cardiovascular;; Bilateral Iliacs  . PERIPHERAL VASCULAR INTERVENTION  04/30/2019   Procedure: PERIPHERAL VASCULAR INTERVENTION; Surgeon: [name], MD; Location: [place] [facility]; Service: Cardiovascular;; Stent - Lt. Iliac          Family History  Problem Relation Age of Onset  . Cancer Mother     SOCIAL HISTORY: Social History        Tobacco Use  . Smoking status: Current Every Day Smoker    Packs/day: 1.00    Years: 36.00    Pack years: 36.00    Types: Cigarettes  . Smokeless tobacco: Never Used  Substance Use Topics  .  Alcohol use: Never    Frequency: Never    No Known Allergies        Current Outpatient Medications  Medication Sig Dispense Refill  . acetaminophen (TYLENOL) 500 MG tablet Take 1,000 mg by mouth daily as needed for  moderate pain or headache.    . chlorthalidone (HYGROTON) 50 MG tablet Take 50 mg by mouth daily.    . clopidogrel (PLAVIX) 75 MG tablet Take 75 mg by mouth daily.    . diphenhydrAMINE (BENADRYL) 25 MG tablet Take 25 mg by mouth daily as needed for itching.    . empagliflozin (JARDIANCE) 10 MG TABS tablet Take 10 mg by mouth daily.    Marland Kitchen gabapentin (NEURONTIN) 300 MG capsule Take 300 mg by mouth 3 (three) times daily.    Marland Kitchen glipiZIDE (GLUCOTROL) 5 MG tablet Take 5 mg by mouth 2 (two) times daily before a meal.     . Insulin Glargine (BASAGLAR KWIKPEN) 100 UNIT/ML SOPN Inject 20 Units into the skin every evening.    . labetalol (NORMODYNE) 300 MG tablet Take 300 mg by mouth 2 (two) times daily.    . nicotine (NICODERM CQ - DOSED IN MG/24 HOURS) 21 mg/24hr patch Place 21 mg onto the skin daily.    . Omega-3 Fatty Acids (FISH OIL PO) Take 1 capsule by mouth 2 (two) times a day.    Marland Kitchen atorvastatin (LIPITOR) 80 MG tablet Take 80 mg by mouth daily.      No current facility-administered medications for this visit.    REVIEW OF SYSTEMS: [X]  denotes positive finding, [ ]  denotes negative finding Cardiac  Comments:  Chest pain or chest pressure:    Shortness of breath upon exertion:    Short of breath when lying flat:    Irregular heart rhythm:        Vascular    Pain in calf, thigh, or hip brought on by ambulation:    Pain in feet at night that wakes you up from your sleep:  x Left worse  Blood clot in your veins:    Leg swelling:         Pulmonary    Oxygen at home:    Productive cough:     Wheezing:         Neurologic    Sudden weakness in arms or legs:     Sudden numbness in arms or legs:     Sudden onset of difficulty speaking or slurred speech:    Temporary loss of vision in one eye:     Problems with dizziness:         Gastrointestinal    Blood in stool:     Vomited blood:          Genitourinary    Burning when urinating:     Blood in urine:        Psychiatric    Major depression:         Hematologic    Bleeding problems:    Problems with blood clotting too easily:        Skin    Rashes or ulcers:        Constitutional    Fever or chills:      PHYSICAL EXAM:     Vitals:   05/20/19 0956 05/20/19 0959  BP: (!) 189/84 (!) 191/90  Pulse: 87 87  Resp: 18 16  Temp:  99.1 F (37.3 C)  TempSrc:  Temporal  SpO2:  100%  Weight: 212 lb (  96.2 kg) 212 lb (96.2 kg)  Height: 6\' 1"  (1.854 m) 6\' 1"  (1.854 m)    GENERAL:The patient is a well-nourished male, in no acute distress. The vital signs are documented above. CARDIAC:There is a regular rate and rhythm.  VASCULAR: 1+ bilateral femoral pulse No tissue loss lower extremities No palpable pedal pulses Monophasic DP/PT signals bilaterally PULMONARY:There is good air exchange bilaterally without wheezing or rales. ABDOMEN:Soft and non-tender with normal pitched bowel sounds.  MUSCULOSKELETAL:There are no major deformities or cyanosis. NEUROLOGIC:No focal weakness or paresthesias are detected.  DATA:  I independently reviewed his noninvasive imaging and largely noncompressible ABIs with monophasic waveforms at both ankles.  Assessment/Plan:  53 year old male that was previously evaluated for critical limb ischemia of the bilateral lower extremities with severe rest pain. Ultimately underwent bilateral common iliac stenting on 02/06/2019 and then extended left external iliac stent on 04/30/19.Unfortunately still having rest pain in the left foot that wakes him up at night. His ABIs are noncompressible. He has a left popliteal occlusion as well as a very diseased SFA and reconstitutes what appears to be posterior tibial as his best runoff.Ultimately severe multi-level occlusive disease.I have now recommended a leftfemoral to posterior tibial artery  bypass. I will obtainvein mappingof his lower and upper extremities to see what conduit we have. He will be scheduled for surgery a week from Monday and states he does not have the ability to have surgery next week. I will have him hold hisPlavix oneweek prior to surgery, may continue as aspirin.   [name], MD [facility] Office: [phone] Pager: [place]

## 2019-08-12 ENCOUNTER — Encounter (HOSPITAL_COMMUNITY): Payer: Self-pay | Admitting: Vascular Surgery

## 2019-08-12 ENCOUNTER — Inpatient Hospital Stay (HOSPITAL_COMMUNITY): Payer: Managed Care, Other (non HMO)

## 2019-08-12 DIAGNOSIS — M7989 Other specified soft tissue disorders: Secondary | ICD-10-CM | POA: Diagnosis not present

## 2019-08-12 LAB — BASIC METABOLIC PANEL
Anion gap: 14 (ref 5–15)
BUN: 24 mg/dL — ABNORMAL HIGH (ref 6–20)
CO2: 20 mmol/L — ABNORMAL LOW (ref 22–32)
Calcium: 8.1 mg/dL — ABNORMAL LOW (ref 8.9–10.3)
Chloride: 95 mmol/L — ABNORMAL LOW (ref 98–111)
Creatinine, Ser: 1.97 mg/dL — ABNORMAL HIGH (ref 0.61–1.24)
GFR calc Af Amer: 43 mL/min — ABNORMAL LOW (ref >60)
GFR calc non Af Amer: 37 mL/min — ABNORMAL LOW (ref >60)
Glucose, Bld: 200 mg/dL — ABNORMAL HIGH (ref 70–99)
Potassium: 4.3 mmol/L (ref 3.5–5.1)
Sodium: 129 mmol/L — ABNORMAL LOW (ref 135–145)

## 2019-08-12 LAB — BPAM RBC
Blood Product Expiration Date: 202011162359
Blood Product Expiration Date: 202011162359
ISSUE DATE / TIME: 202010121249
ISSUE DATE / TIME: 202010121249
Unit Type and Rh: 5100
Unit Type and Rh: 5100

## 2019-08-12 LAB — TYPE AND SCREEN
ABO/RH(D): O POS
Antibody Screen: NEGATIVE
Unit division: 0
Unit division: 0

## 2019-08-12 LAB — PREPARE PLATELET PHERESIS: Unit division: 0

## 2019-08-12 LAB — CBC
HCT: 27.7 % — ABNORMAL LOW (ref 39.0–52.0)
Hemoglobin: 9.9 g/dL — ABNORMAL LOW (ref 13.0–17.0)
MCH: 29.9 pg (ref 26.0–34.0)
MCHC: 35.7 g/dL (ref 30.0–36.0)
MCV: 83.7 fL (ref 80.0–100.0)
Platelets: 68 10*3/uL — ABNORMAL LOW (ref 150–400)
RBC: 3.31 MIL/uL — ABNORMAL LOW (ref 4.22–5.81)
RDW: 13.5 % (ref 11.5–15.5)
WBC: 14.7 10*3/uL — ABNORMAL HIGH (ref 4.0–10.5)
nRBC: 0 % (ref 0.0–0.2)

## 2019-08-12 LAB — POCT I-STAT 4, (NA,K, GLUC, HGB,HCT)
Glucose, Bld: 197 mg/dL — ABNORMAL HIGH (ref 70–99)
HCT: 22 % — ABNORMAL LOW (ref 39.0–52.0)
Hemoglobin: 7.5 g/dL — ABNORMAL LOW (ref 13.0–17.0)
Potassium: 4 mmol/L (ref 3.5–5.1)
Sodium: 132 mmol/L — ABNORMAL LOW (ref 135–145)

## 2019-08-12 LAB — BPAM PLATELET PHERESIS
Blood Product Expiration Date: 202010122359
ISSUE DATE / TIME: 202010121234
Unit Type and Rh: 8400

## 2019-08-12 LAB — GLUCOSE, CAPILLARY
Glucose-Capillary: 167 mg/dL — ABNORMAL HIGH (ref 70–99)
Glucose-Capillary: 203 mg/dL — ABNORMAL HIGH (ref 70–99)
Glucose-Capillary: 194 mg/dL — ABNORMAL HIGH (ref 70–99)
Glucose-Capillary: 209 mg/dL — ABNORMAL HIGH (ref 70–99)

## 2019-08-12 LAB — POCT ACTIVATED CLOTTING TIME: Activated Clotting Time: 136 seconds

## 2019-08-12 MED ORDER — CHLORHEXIDINE GLUCONATE CLOTH 2 % EX PADS
6.0000 | MEDICATED_PAD | Freq: Every day | CUTANEOUS | Status: AC
  Administered 2019-08-12 – 2019-08-16 (×5): 6 via TOPICAL

## 2019-08-12 MED ORDER — LACTATED RINGERS IV SOLN
INTRAVENOUS | Status: DC
  Administered 2019-08-12 – 2019-08-13 (×3): via INTRAVENOUS

## 2019-08-12 MED ORDER — MUPIROCIN 2 % EX OINT
1.0000 "application " | TOPICAL_OINTMENT | Freq: Two times a day (BID) | CUTANEOUS | Status: AC
  Administered 2019-08-12 – 2019-08-16 (×10): 1 via NASAL
  Filled 2019-08-12 (×2): qty 22

## 2019-08-12 NOTE — Evaluation (Signed)
Physical Therapy Evaluation Patient Details Name: Edward Rocha. MRN: [id number] DOB: 04-Oct-1966 Today's Date: 08/12/2019   History of Present Illness  Pt is a 53 y/o male s/p L common femoral to posterior tibial composite bypass with PTFE and saphenous vein. Noted R UE edema, imaging negative for DVT/SVT. PMH :ARF, DM 2, HTN, PVD, thrombocytopenia.   Clinical Impression  Pt motivated to get better and eager for the pain to improve. Pt was indep PTA, now requiring RW for safe ambulation due to pain in L LE. Pt tolerated amb 100' with RW today, encouraged to amb with RN later today. Pt to benefit from HHPT to progress ROM L LE and ambulation without AD. Acute PT to cont to follow.    Follow Up Recommendations Home health PT;Supervision - Intermittent    Equipment Recommendations  Rolling walker with 5" wheels(may already have one)    Recommendations for Other Services       Precautions / Restrictions Precautions Precautions: Fall Restrictions Weight Bearing Restrictions: No      Mobility  Bed Mobility Overal bed mobility: Needs Assistance Bed Mobility: Supine to Sit     Supine to sit: Supervision     General bed mobility comments: increased time and effort, HOB elevated but no physical assist required  Transfers Overall transfer level: Needs assistance Equipment used: Rolling walker (2 wheeled) Transfers: Sit to/from Stand Sit to Stand: Min guard         General transfer comment: cueing for hand placement and safety; steadying support  Ambulation/Gait Ambulation/Gait assistance: Min guard Gait Distance (Feet): 100 Feet Assistive device: Rolling walker (2 wheeled) Gait Pattern/deviations: Step-to pattern;Decreased stride length;Decreased stance time - left;Decreased weight shift to left Gait velocity: slow   General Gait Details: verbal cues to no pick up walker and to just push it, verbal cues to progress to step through gait pattern, increased bilat UE  support with L LE WBing  Stairs            Wheelchair Mobility    Modified Rankin (Stroke Patients Only)       Balance Overall balance assessment: Needs assistance Sitting-balance support: No upper extremity supported;Feet supported Sitting balance-Leahy Scale: Good     Standing balance support: Bilateral upper extremity supported;During functional activity;No upper extremity supported Standing balance-Leahy Scale: Poor Standing balance comment: relaint on BUE support dynamically, able to engage in grooming at sink with no UE support given min guard                              Pertinent Vitals/Pain Pain Assessment: 0-10 Pain Score: 7 (decreased to 6 s/p ambulation) Pain Location: L LE  Pain Descriptors / Indicators: Discomfort;Operative site guarding;Burning Pain Intervention(s): Monitored during session    Home Living Family/patient expects to be discharged to:: Private residence Living Arrangements: Spouse/significant other;Other relatives Available Help at Discharge: Family;Available 24 hours/day Type of Home: House Home Access: Stairs to enter   [employer] of Steps: 1 threshold Home Layout: One level Home Equipment: Walker - 2 wheels;Cane - single point;Walker - 4 wheels      Prior Function Level of Independence: Independent         Comments: used walker/cane as needed, independent ADLs, works as a Librarian, academic: (bil)    Extremity/Trunk Assessment   Upper Extremity Assessment Upper Extremity Assessment: RUE deficits/detail RUE Deficits / Details: edema, otherwise  WFL     Lower Extremity Assessment Lower Extremity Assessment: LLE deficits/detail LLE Deficits / Details: limited active ROM at knee due to increased pain, initiated quad set and ankle DF , AAROM at hip and knee to 90 deg    Cervical / Trunk Assessment Cervical / Trunk Assessment: Normal  Communication   Communication: No  difficulties  Cognition Arousal/Alertness: Awake/alert Behavior During Therapy: WFL for tasks assessed/performed Overall Cognitive Status: Within Functional Limits for tasks assessed                                        General Comments General comments (skin integrity, edema, etc.): incision in good condition, 1 small area with drainage    Exercises     Assessment/Plan    PT Assessment Patient needs continued PT services  PT Problem List Decreased strength;Decreased range of motion;Decreased activity tolerance;Decreased balance;Decreased mobility       PT Treatment Interventions DME instruction;Gait training;Stair training;Functional mobility training;Therapeutic activities;Therapeutic exercise;Balance training;Neuromuscular re-education    PT Goals (Current goals can be found in the Care Plan section)  Acute Rehab PT Goals Patient Stated Goal: to get home and have less pain  PT Goal Formulation: With patient Time For Goal Achievement: 08/26/19 Potential to Achieve Goals: Good    Frequency Min 3X/week   Barriers to discharge        Co-evaluation               AM-PAC PT "6 Clicks" Mobility  Outcome Measure Help needed turning from your back to your side while in a flat bed without using bedrails?: None Help needed moving from lying on your back to sitting on the side of a flat bed without using bedrails?: None Help needed moving to and from a bed to a chair (including a wheelchair)?: A Little Help needed standing up from a chair using your arms (e.g., wheelchair or bedside chair)?: A Little Help needed to walk in hospital room?: A Little Help needed climbing 3-5 steps with a railing? : A Lot 6 Click Score: 19    End of Session Equipment Utilized During Treatment: Gait belt Activity Tolerance: Patient tolerated treatment well Patient left: in chair;with call bell/phone within reach Nurse Communication: Mobility status PT Visit Diagnosis:  Unsteadiness on feet (R26.81);Difficulty in walking, not elsewhere classified (R26.2);Pain Pain - Right/Left: Left Pain - part of body: Leg    Time: 1220-1240 PT Time Calculation (min) (ACUTE ONLY): 20 min   Charges:   PT Evaluation $PT Eval Moderate Complexity: 1 Mod          Kittie Plater, PT, DPT Acute Rehabilitation Services Pager #: [phone] Office #: [phone]   Berline Lopes 08/12/2019, 2:21 PM

## 2019-08-12 NOTE — Progress Notes (Signed)
.    Pharmacy is to monitor any patients on an SGLT2 inhibitor and automatically discontinue it during hospitalization in any of the following situations: .  o Urinary tract infection: NA o Acute renal failure: Scr 1.18>>1.97 o eGFR <45 (CrCl 52 ok) o Diabetic ketoacidosis (bicarb low at 20) o Metabolic acidosis (bicarb low at 20) o NPO status: +diet o Dehydration: Plan Hydration with LR per MD note. o Volume depletion  10/13: Dose already given. F/u labs 10/14. Will hold med until then.  Karmyn Lowman S. Alford Highland, PharmD, [place] Clinical Staff Pharmacist [phone]

## 2019-08-12 NOTE — Plan of Care (Signed)
°  Problem: Clinical Measurements: °Goal: Respiratory complications will improve °Outcome: Progressing °Goal: Cardiovascular complication will be avoided °Outcome: Progressing °  °Problem: Activity: °Goal: Risk for activity intolerance will decrease °Outcome: Progressing °  °

## 2019-08-12 NOTE — Progress Notes (Signed)
Right upper ext venous  has been completed. Refer to [facility] under chart review to view preliminary results.   08/12/2019  10:28 AM Kiesha Ensey, Bonnye Fava

## 2019-08-12 NOTE — Evaluation (Signed)
Occupational Therapy Evaluation Patient Details Name: Edward Rocha. MRN: [id number] DOB: April 30, 1966 Today's Date: 08/12/2019    History of Present Illness Pt is a 53 y/o male s/p L common femoral to posterior tibial composite bypass with PTFE and saphenous vein. Noted R UE edema, imaging negative for DVT/SVT. PMH :ARF, DM 2, HTN, PVD, thrombocytopenia.    Clinical Impression   PTA patient independent. Admitted for above and limited by problem list below, including pain in L LE, decreased ROM/functional reach to L LE, and impaired balance. Patient currently requires min guard for transfers, min guard for grooming at sink and min assist for LB ADLs.  He will benefit from continued OT services while admitted to maximize independence and safety with ADLs/mobility, but anticipate he will progress well and no further OT services will be needed after dc.     Follow Up Recommendations  No OT follow up;Supervision - Intermittent    Equipment Recommendations  3 in 1 bedside commode    Recommendations for Other Services       Precautions / Restrictions Precautions Precautions: Fall Restrictions Weight Bearing Restrictions: No      Mobility Bed Mobility Overal bed mobility: Needs Assistance Bed Mobility: Supine to Sit     Supine to sit: Supervision     General bed mobility comments: increased time and effort, HOB elevated but no physical assist required  Transfers Overall transfer level: Needs assistance Equipment used: Rolling walker (2 wheeled) Transfers: Sit to/from Stand Sit to Stand: Min guard         General transfer comment: cueing for hand placement and safety; steadying support    Balance Overall balance assessment: Needs assistance Sitting-balance support: No upper extremity supported;Feet supported Sitting balance-Leahy Scale: Good     Standing balance support: Bilateral upper extremity supported;During functional activity;No upper extremity  supported Standing balance-Leahy Scale: Poor Standing balance comment: relaint on BUE support dynamically, able to engage in grooming at sink with no UE support given min guard                            ADL either performed or assessed with clinical judgement   ADL Overall ADL's : Needs assistance/impaired     Grooming: Standing;Min guard   Upper Body Bathing: Set up;Sitting   Lower Body Bathing: Minimal assistance;Sit to/from stand   Upper Body Dressing : Set up;Sitting   Lower Body Dressing: Minimal assistance;Sit to/from stand   Toilet Transfer: Ambulation;Min Psychiatric nurse Details (indicate cue type and reason): simulated in room         Functional mobility during ADLs: Minimal assistance;Min guard;Rolling walker;Cueing for safety;Cueing for sequencing General ADL Comments: pt limited by pain      Vision         Perception     Praxis      Pertinent Vitals/Pain Pain Assessment: 0-10 Pain Score: 7  Pain Location: L LE  Pain Descriptors / Indicators: Discomfort;Operative site guarding;Burning Pain Intervention(s): Monitored during session;Repositioned;Limited activity within patient's tolerance     Hand Dominance (bil)   Extremity/Trunk Assessment Upper Extremity Assessment Upper Extremity Assessment: RUE deficits/detail RUE Deficits / Details: edema, otherwise WFL    Lower Extremity Assessment Lower Extremity Assessment: Defer to PT evaluation       Communication Communication Communication: No difficulties   Cognition Arousal/Alertness: Awake/alert Behavior During Therapy: WFL for tasks assessed/performed Overall Cognitive Status: Within Functional Limits for tasks assessed  General Comments  encouarged functional use of R UE, elevation and hand pumps     Exercises     Shoulder Instructions      Home Living Family/patient expects to be discharged to:: Private  residence Living Arrangements: Spouse/significant other;Other relatives Available Help at Discharge: Family;Available 24 hours/day Type of Home: House Home Access: Stairs to enter [employer] of Steps: 1 threshold   Home Layout: One level     Bathroom Shower/Tub: Teacher, early years/pre: Standard     Home Equipment: Environmental consultant - 2 wheels;Cane - single point;Walker - 4 wheels          Prior Functioning/Environment Level of Independence: Independent        Comments: used walker/cane as needed, independent ADLs, works as a Scientist, clinical (histocompatibility and immunogenetics) Problem List: Decreased activity tolerance;Impaired balance (sitting and/or standing);Decreased knowledge of use of DME or AE;Decreased knowledge of precautions;Pain;Increased edema      OT Treatment/Interventions: Self-care/ADL training;DME and/or AE instruction;Therapeutic activities;Patient/family education;Balance training    OT Goals(Current goals can be found in the care plan section) Acute Rehab OT Goals Patient Stated Goal: to get home and have less pain  OT Goal Formulation: With patient Time For Goal Achievement: 08/26/19 Potential to Achieve Goals: Good  OT Frequency: Min 2X/week   Barriers to D/C:            Co-evaluation              AM-PAC OT "6 Clicks" Daily Activity     Outcome Measure Help from another person eating meals?: None Help from another person taking care of personal grooming?: A Little Help from another person toileting, which includes using toliet, bedpan, or urinal?: A Little Help from another person bathing (including washing, rinsing, drying)?: A Little Help from another person to put on and taking off regular upper body clothing?: None Help from another person to put on and taking off regular lower body clothing?: A Little 6 Click Score: 20   End of Session Equipment Utilized During Treatment: Gait belt;Rolling walker  Activity Tolerance: Patient tolerated treatment  well Patient left: Other (comment)(with PT )  OT Visit Diagnosis: Other abnormalities of gait and mobility (R26.89);Pain Pain - Right/Left: Left Pain - part of body: Leg                Time: 1206-1225 OT Time Calculation (min): 19 min Charges:  OT General Charges $OT Visit: 1 Visit OT Evaluation $OT Eval Moderate Complexity: [place], OT Acute Rehabilitation Services Pager [phone] Office [phone]   Delight Stare 08/12/2019, 1:35 PM

## 2019-08-12 NOTE — Progress Notes (Signed)
[facility] [place] [place]  Subjective  -no complaints.  Left foot with brisk PT signal.   Objective (!) 151/76 77 98.1 F (36.7 C) (Oral) (!) 8 97%  Intake/Output Summary (Last 24 hours) at 08/12/2019 0725 Last data filed at 08/12/2019 0710 Gross per 24 hour  Intake 3564 ml  Output 1743 ml  Net 1821 ml    Left groin and left leg incisions clean dry intact, no hematoma Left posterior tibial signal brisk by Doppler L:eft foot motor and sensory intact  Laboratory Lab Results: Recent Labs    08/11/19 1813 08/12/19 0015  WBC 18.3* 14.7*  HGB 10.6* 9.9*  HCT 29.4* 27.7*  PLT 72* 68*   BMET Recent Labs    08/11/19 1238 08/11/19 1813 08/12/19 0015  NA 132*  --  129*  K 4.0  --  4.3  CL  --   --  95*  CO2  --   --  20*  GLUCOSE 197*  --  200*  BUN  --   --  24*  CREATININE  --  1.78* 1.97*  CALCIUM  --   --  8.1*    COAG Lab Results  Component Value Date   INR 1.2 08/07/2019   INR 1.3 (H) 05/27/2019   No results found for: PTT  Assessment/Planning: POD #1 status post left common femoral to posterior tibial composite bypass with PTFE and saphenous vein.  Very brisk left posterior tibial signal this morning.  Creatinine 1.97 from baseline 1.18, will hydrate with 50 of LR today.  Nursing reports urine is very dark.  Otherwise can have a diet out of bed to chair PT etc.    [name] 08/12/2019 7:25 AM --

## 2019-08-12 NOTE — Progress Notes (Signed)
    Called to room by RN for left arm edema acute onset.  Pa;lpable left radial pulse, motor and sensation intact.    I have ordered left UE venous duplex to r/o DVT.  [name] PA-C

## 2019-08-13 ENCOUNTER — Inpatient Hospital Stay (HOSPITAL_COMMUNITY): Payer: Managed Care, Other (non HMO)

## 2019-08-13 LAB — URINALYSIS, COMPLETE (UACMP) WITH MICROSCOPIC
Bilirubin Urine: NEGATIVE
Glucose, UA: >=500 mg/dL — AB
Ketones, ur: NEGATIVE mg/dL
Leukocytes,Ua: NEGATIVE
Nitrite: NEGATIVE
Protein, ur: 100 mg/dL — AB
RBC / HPF: >50 RBC/hpf — ABNORMAL HIGH (ref 0–5)
Specific Gravity, Urine: 1.014 (ref 1.005–1.030)
pH: 5 (ref 5.0–8.0)

## 2019-08-13 LAB — GLUCOSE, CAPILLARY
Glucose-Capillary: 158 mg/dL — ABNORMAL HIGH (ref 70–99)
Glucose-Capillary: 212 mg/dL — ABNORMAL HIGH (ref 70–99)
Glucose-Capillary: 187 mg/dL — ABNORMAL HIGH (ref 70–99)
Glucose-Capillary: 186 mg/dL — ABNORMAL HIGH (ref 70–99)

## 2019-08-13 LAB — BASIC METABOLIC PANEL
Anion gap: 12 (ref 5–15)
BUN: 40 mg/dL — ABNORMAL HIGH (ref 6–20)
CO2: 21 mmol/L — ABNORMAL LOW (ref 22–32)
Calcium: 7.6 mg/dL — ABNORMAL LOW (ref 8.9–10.3)
Chloride: 92 mmol/L — ABNORMAL LOW (ref 98–111)
Creatinine, Ser: 2.73 mg/dL — ABNORMAL HIGH (ref 0.61–1.24)
GFR calc Af Amer: 29 mL/min — ABNORMAL LOW (ref >60)
GFR calc non Af Amer: 25 mL/min — ABNORMAL LOW (ref >60)
Glucose, Bld: 152 mg/dL — ABNORMAL HIGH (ref 70–99)
Potassium: 4.2 mmol/L (ref 3.5–5.1)
Sodium: 125 mmol/L — ABNORMAL LOW (ref 135–145)

## 2019-08-13 LAB — CREATININE, URINE, RANDOM: Creatinine, Urine: 195.2 mg/dL

## 2019-08-13 LAB — SODIUM, URINE, RANDOM: Sodium, Ur: <10 mmol/L

## 2019-08-13 NOTE — Plan of Care (Signed)
  Problem: Clinical Measurements: Goal: Respiratory complications will improve Outcome: Progressing Goal: Cardiovascular complication will be avoided Outcome: Progressing   Problem: Elimination: Goal: Will not experience complications related to urinary retention Outcome: Not Progressing

## 2019-08-13 NOTE — Progress Notes (Signed)
1230 pm.  16 Fr foley catheter inserted without difficulty.  Perineal care completed prior to insertions.  500 cc amber colored urine returned.  Secure lock placed on right thigh.  Education provided prior to insertion.  Patient tolerated well.

## 2019-08-13 NOTE — Consult Note (Signed)
Reason for Consult: AKI Referring Physician: Carlis Abbott, MD  Edward Rocha. is an 53 y.o. male.  HPI: Edward Rocha has a PMH significant for DM, HTN, HLD, thrombocytopenia, and PAD s/p aortogram with left iliac arteriogram prior to left femoral to PTA bypass graft placement on 08/11/19.  He has done well post--operatively, however he developed AKI and we have been consulted to further evaluate and manage his AKI.  He received only 43 ml of IV contrast, however his Cr has continued to climb as seen in the trend below.  He has also had significant drop in his UOP after his foley catheter was removed on 08/11/19.  He did have an I&O cath yesterday with 550 cc PVR. He also suffered some ABLA with Hgb dropping from 12.4 to 7.5, but no documented hypotension.  He had not received any ACE/ARB, NSAIDs, or COX-II I's but did receive a dose of invokana 08/12/19 but it was discontinued before his next dose today.  Trend in Creatinine: Creatinine, Ser  Date/Time Value Ref Range Status  08/13/2019 04:55 AM 2.73 (H) 0.61 - 1.24 mg/dL Final  08/12/2019 12:15 AM 1.97 (H) 0.61 - 1.24 mg/dL Final  08/11/2019 06:13 PM 1.78 (H) 0.61 - 1.24 mg/dL Final  08/07/2019 08:46 AM 1.18 0.61 - 1.24 mg/dL Final  07/11/2019 07:54 AM 1.51 (H) 0.61 - 1.24 mg/dL Final  07/09/2019 03:20 PM 0.96 0.61 - 1.24 mg/dL Final  05/29/2019 02:43 PM 1.08 0.61 - 1.24 mg/dL Final  05/27/2019 10:50 AM 1.06 0.61 - 1.24 mg/dL Final  04/30/2019 06:53 AM 1.20 0.61 - 1.24 mg/dL Final  02/06/2019 07:08 AM 0.90 0.61 - 1.24 mg/dL Final    PMH:   Past Medical History:  Diagnosis Date  . Acute renal failure (ARF) ([place])    12/2018 when admitted for hyperosmolar hyperglycemic nonketotic syndrome  . Bilateral pain of leg and foot   . Diabetes mellitus without complication ([place])    Type II  . Dizziness   . Elevated LFTs 05/2019  . Frequent urination   . Hyperglycemia    Non-Ketotic Hyperosmolar.  . Hyperlipidemia   . Hypernatremia   . Hypertension    . Peripheral vascular disease ([place])   . Syncope    12/2018, diagnosed with hyperosmolar hyperglycemic nonketotic syndrome, AKI, hyponatremia ([facility])  . Thrombocytopenia ([place])   . Thyroid disease    Hypothyroidism  . Weakness     PSH:   Past Surgical History:  Procedure Laterality Date  . ABDOMINAL AORTOGRAM W/LOWER EXTREMITY N/A 02/06/2019   Procedure: ABDOMINAL AORTOGRAM W/LOWER EXTREMITY;  Surgeon: [name], MD;  Location: [facility];  Service: Cardiovascular;  Laterality: N/A;  bilateral  . ABDOMINAL AORTOGRAM W/LOWER EXTREMITY N/A 04/30/2019   Procedure: ABDOMINAL AORTOGRAM W/LOWER EXTREMITY;  Surgeon: [name], MD;  Location: [facility];  Service: Cardiovascular;  Laterality: N/A;  . AORTOGRAM  08/11/2019   Procedure: Aortogram;  Surgeon: [name], MD;  Location: [facility];  Service: Vascular;;  . FEMORAL-POPLITEAL BYPASS GRAFT Left 08/11/2019   Procedure: BYPASS LEFT FEMORAL-DISTAL POPLITEAL ARTERY USING PROPATEN GRAFT;  Surgeon: [name], MD;  Location: [facility];  Service: Vascular;  Laterality: Left;  Marland Kitchen MULTIPLE TOOTH EXTRACTIONS    . PERIPHERAL VASCULAR INTERVENTION  02/06/2019   Procedure: PERIPHERAL VASCULAR INTERVENTION;  Surgeon: [name], MD;  Location: [facility];  Service: Cardiovascular;;  Bilateral Iliacs  . PERIPHERAL VASCULAR INTERVENTION  04/30/2019   Procedure: PERIPHERAL VASCULAR INTERVENTION;  Surgeon:  [name], MD;  Location: [facility];  Service: Cardiovascular;;  Stent - Lt. Iliac     Allergies:  Allergies  Allergen Reactions  . Other     Pt received platelets and had a bad reaction from infusion     Medications:   Prior to Admission medications   Medication Sig Start Date End Date Taking? Authorizing Provider  Accu-Chek FastClix Lancets MISC TEST ONC EDAILY 05/13/19  Yes [provider]  atorvastatin (LIPITOR) 80 MG tablet Take 80 mg by mouth  daily.  04/16/19  Yes [provider]  chlorthalidone (HYGROTON) 50 MG tablet Take 50 mg by mouth daily.   Yes [provider]  clopidogrel (PLAVIX) 75 MG tablet Take 75 mg by mouth daily.   Yes [provider]  diphenhydrAMINE (BENADRYL) 25 MG tablet Take 25 mg by mouth daily as needed for itching.   Yes [provider]  empagliflozin (JARDIANCE) 10 MG TABS tablet Take 10 mg by mouth daily.   Yes [provider]  gabapentin (NEURONTIN) 300 MG capsule Take 300 mg by mouth 3 (three) times daily.   Yes [provider]  glipiZIDE (GLUCOTROL) 5 MG tablet Take 5 mg by mouth 2 (two) times daily before a meal.    Yes [provider]  Insulin Glargine (BASAGLAR KWIKPEN) 100 UNIT/ML SOPN Inject 20 Units into the skin every evening.   Yes [provider]  labetalol (NORMODYNE) 300 MG tablet Take 300 mg by mouth 2 (two) times daily.   Yes [provider]  NIFEdipine (PROCARDIA XL/NIFEDICAL-XL) 90 MG 24 hr tablet Take 90 mg by mouth daily.  06/18/19  Yes [provider]  acetaminophen (TYLENOL) 500 MG tablet Take 1,000 mg by mouth daily as needed for moderate pain or headache.    [provider]    Inpatient medications: . sodium chloride   Intravenous Once  . aspirin EC  81 mg Oral Q0600  . atorvastatin  80 mg Oral Daily  . Chlorhexidine Gluconate Cloth  6 each Topical Daily  . chlorthalidone  50 mg Oral Daily  . clopidogrel  75 mg Oral Daily  . docusate sodium  100 mg Oral Daily  . gabapentin  300 mg Oral TID  . glipiZIDE  5 mg Oral BID AC  . heparin  5,000 Units Subcutaneous Q8H  . insulin aspart  0-9 Units Subcutaneous TID WC  . insulin glargine  20 Units Subcutaneous QPM  . labetalol  300 mg Oral BID  . mupirocin ointment  1 application Nasal BID  . NIFEdipine  90 mg Oral Daily  . pantoprazole  40 mg Oral Daily    Discontinued Meds:   Medications Discontinued During This Encounter  Medication  Reason  . nicotine (NICODERM CQ - DOSED IN MG/24 HOURS) 21 mg/24hr patch Patient Preference  . predniSONE (DELTASONE) 20 MG tablet Patient Preference  . mupirocin ointment (BACTROBAN) 2 % Patient Preference  . hemostatic agents Patient Discharge  . iodixanol (VISIPAQUE) 320 MG/ML injection Patient Discharge  . 0.9 % irrigation (POUR BTL) Patient Discharge  . heparin 6,000 Units in sodium chloride 0.9 % 500 mL irrigation Patient Discharge  . Chlorhexidine Gluconate Cloth 2 % PADS 6 each Patient Transfer  . Chlorhexidine Gluconate Cloth 2 % PADS 6 each Patient Transfer  . 0.9 %  sodium chloride infusion Patient Transfer  . fentaNYL (SUBLIMAZE) injection 25-50 mcg Patient Transfer  . canagliflozin [facility]) tablet 100 mg Lab parameters not met  . sodium  phosphate (FLEET) 7-19 GM/118ML enema 1 enema     Social History:  reports that he quit smoking about 6 months ago. His smoking use included cigarettes. He has a 36.00 pack-year smoking history. He has never used smokeless tobacco. He reports previous alcohol use. He reports that he does not use drugs.  Family History:   Family History  Problem Relation Age of Onset  . Alcohol abuse Father   . Cancer Mother   . Alcohol abuse Brother   . Bipolar disorder Son   . Bipolar disorder Daughter     A comprehensive review of systems was negative except for: Constitutional: positive for fatigue Cardiovascular: positive for dyspnea on exertion Musculoskeletal: positive for left leg pain Weight change:   Intake/Output Summary (Last 24 hours) at 08/13/2019 1315 Last data filed at 08/13/2019 0900 Gross per 24 hour  Intake 1322.82 ml  Output 600 ml  Net 722.82 ml   BP 135/67 (BP Location: Left Arm)   Pulse 98   Temp 99.5 F (37.5 C) (Oral)   Resp 11   Ht _0  (1.854 m)   Wt 98.4 kg   SpO2 95%   BMI 28.63 kg/m  Vitals:   08/12/19 2346 08/13/19 0423 08/13/19 0839 08/13/19 1258  BP: (!) 161/65 (!) 154/62 (!) 150/63 135/67  Pulse: 92  95 91 98  Resp:  _1 Temp: 98.4 F (36.9 C) 98.4 F (36.9 C) 98.5 F (36.9 C) 99.5 F (37.5 C)  TempSrc: Oral Oral Oral Oral  SpO2: 95% 94%  95%  Weight:      Height:         General appearance: alert, cooperative and no distress Head: Normocephalic, without obvious abnormality, atraumatic Resp: clear to auscultation bilaterally Cardio: regular rate and rhythm, S1, S2 normal, no murmur, click, rub or gallop GI: soft, non-tender; bowel sounds normal; no masses,  no organomegaly Extremities: left leg s/p bypass grafting with sutures in place with some serosanguinous drainage and 1+ edema of left leg  Labs: Basic Metabolic Panel: Recent Labs  Lab 08/07/19 0846 08/11/19 1238 08/11/19 1813 08/12/19 0015 08/13/19 0455  NA 132* 132*  --  129* 125*  K 3.2* 4.0  --  4.3 4.2  CL 97*  --   --  95* 92*  CO2 24  --   --  20* 21*  GLUCOSE 163* 197*  --  200* 152*  BUN 10  --   --  24* 40*  CREATININE 1.18  --  1.78* 1.97* 2.73*  ALBUMIN 3.1*  --   --   --   --   CALCIUM 8.9  --   --  8.1* 7.6*   Liver Function Tests: Recent Labs  Lab 08/07/19 0846  AST 59*  ALT 32  ALKPHOS 88  BILITOT 0.9  PROT 8.2*  ALBUMIN 3.1*   No results for input(s): LIPASE, AMYLASE in the last 168 hours. No results for input(s): AMMONIA in the last 168 hours. CBC: Recent Labs  Lab 08/07/19 0846 08/11/19 1219 08/11/19 1238 08/11/19 1813 08/12/19 0015  WBC 5.1 9.0  --  18.3* 14.7*  NEUTROABS  --  7.3  --   --   --   HGB 12.4* 7.5* 7.5* 10.6* 9.9*  HCT 37.8* 22.1* 22.0* 29.4* 27.7*  MCV 84.8 85.3  --  82.8 83.7  PLT 83* 55*  --  72* 68*   PT/INR: _2 (inr:5) Cardiac Enzymes: )No results for input(s): CKTOTAL, CKMB, CKMBINDEX, TROPONINI in the last  168 hours. CBG: Recent Labs  Lab 08/12/19 1130 08/12/19 1548 08/12/19 2111 08/13/19 0635 08/13/19 1257  GLUCAP 203* 194* 209* 158* 212*    Iron Studies: No results for input(s): IRON, TIBC, TRANSFERRIN, FERRITIN in the last  168 hours.  Xrays/Other Studies: Vas [place] Upper Extremity Venous Duplex  Result Date: 08/12/2019 UPPER VENOUS STUDY  Indications: Swelling, and with no pain. Comparison Study: No priors. Performing Technologist: Oda Cogan RDMS, RVT  Examination Guidelines: A complete evaluation includes B-mode imaging, spectral Doppler, color Doppler, and power Doppler as needed of all accessible portions of each vessel. Bilateral testing is considered an integral part of a complete examination. Limited examinations for reoccurring indications may be performed as noted.  Right Findings: +----------+------------+---------+-----------+----------+-------+ RIGHT     CompressiblePhasicitySpontaneousPropertiesSummary +----------+------------+---------+-----------+----------+-------+ IJV           Full       Yes       Yes                      +----------+------------+---------+-----------+----------+-------+ Subclavian    Full       Yes       Yes                      +----------+------------+---------+-----------+----------+-------+ Axillary      Full       Yes       Yes                      +----------+------------+---------+-----------+----------+-------+ Brachial      Full       Yes       Yes                      +----------+------------+---------+-----------+----------+-------+ Radial        Full                                          +----------+------------+---------+-----------+----------+-------+ Ulnar         Full                                          +----------+------------+---------+-----------+----------+-------+  Left Findings: +----------+------------+---------+-----------+----------+-------+ LEFT      CompressiblePhasicitySpontaneousPropertiesSummary +----------+------------+---------+-----------+----------+-------+ Subclavian    Full       Yes       Yes                      +----------+------------+---------+-----------+----------+-------+   Summary:  Right: No evidence of deep vein thrombosis in the upper extremity. No evidence of superficial vein thrombosis in the upper extremity.  Left: No evidence of thrombosis in the subclavian.  *See table(s) above for measurements and observations.  Diagnosing physician: Deitra Mayo MD Electronically signed by Deitra Mayo MD on 08/12/2019 at 3:30:54 PM.    Final      Assessment/Plan: 1.  AKI- likely multifactorial:  Ischemic atn with ABLA and relative hypotension as well as contrast nephropathy and likely bladder outlet obstruction.  Also need to rule out myeloma given profound AKI with small amount of iv contrast. 1. Check renal US to r/o obstruction 2. Agree with replacing foley given BOO and large PVR 3. Follow renal function. 4. Recheck UA 5. No indication for  dialysis at this time. 2. ABLA- he was transfused 2 units PRBCs post-op.  Continue to follow and transfuse prn 3. HTN- elevated and may need to add coreg or increase nifedipine. 4. PAD- s/p left fem to post tibial artery bypass graft.  Per Dr. Carlis Abbott. 5. DM type 2- poorly controlled.  Agree with holding invokana with AKI and follow. 6. Deconditioning- working with PT   [place] 08/13/2019, 1:15 PM

## 2019-08-13 NOTE — Plan of Care (Signed)

## 2019-08-13 NOTE — Progress Notes (Addendum)
  Progress Note    08/13/2019 7:30 AM 2 Days Post-Op  Subjective:  Some burning around vein harvest incision in mid thigh   Vitals:   08/12/19 2346 08/13/19 0423  BP: (!) 161/65 (!) 154/62  Pulse: 92 95  Resp:  11  Temp: 98.4 F (36.9 C) 98.4 F (36.9 C)  SpO2: 95% 94%   Physical Exam: Lungs:  Non labored Incisions:  L groin and incisions of LLE c/d/i Extremities:  L PT and DP brisk by doppler Neurologic: A&O  CBC    Component Value Date/Time   WBC 14.7 (H) 08/12/2019 0015   RBC 3.31 (L) 08/12/2019 0015   HGB 9.9 (L) 08/12/2019 0015   HCT 27.7 (L) 08/12/2019 0015   PLT 68 (L) 08/12/2019 0015   MCV 83.7 08/12/2019 0015   MCH 29.9 08/12/2019 0015   MCHC 35.7 08/12/2019 0015   RDW 13.5 08/12/2019 0015   LYMPHSABS 1.2 08/11/2019 1219   MONOABS 0.3 08/11/2019 1219   EOSABS 0.1 08/11/2019 1219   BASOSABS 0.0 08/11/2019 1219    BMET    Component Value Date/Time   NA 125 (L) 08/13/2019 0455   K 4.2 08/13/2019 0455   CL 92 (L) 08/13/2019 0455   CO2 21 (L) 08/13/2019 0455   GLUCOSE 152 (H) 08/13/2019 0455   BUN 40 (H) 08/13/2019 0455   CREATININE 2.73 (H) 08/13/2019 0455   CALCIUM 7.6 (L) 08/13/2019 0455   GFRNONAA 25 (L) 08/13/2019 0455   GFRAA 29 (L) 08/13/2019 0455    INR    Component Value Date/Time   INR 1.2 08/07/2019 0846     Intake/Output Summary (Last 24 hours) at 08/13/2019 0730 Last data filed at 08/13/2019 0238 Gross per 24 hour  Intake 1174.49 ml  Output 600 ml  Net 574.49 ml     Assessment/Plan:  53 y.o. male is s/p L femoral to PTA bypass with composite 2 Days Post-Op   Perfusing LLE well with brisk pedal doppler signals Encouraged OOB AKI: Cr increased, electrolyte abnormalities, decreased urine output; will consult Nephrology Home when renal function improved, mobility increased, and pain controlled   Dagoberto Ligas, PA-C [facility] [phone] 08/13/2019 7:30 AM   I have seen and evaluated the  patient. I agree with the PA note as documented above. Very brisk left PT signal after left fem PT bypass with composite PTFE/GSV.  Swelling in left leg expected after revascularization.  Right arm duplex negative for DVT yesterday.  Will consult nephrology for AKI.  Given gentle hydration yesterday with IVF at 50 but Cr continues to rise and had to have I&O overnight.  Will place foley with strict I&O.  PT etc.  [name], MD [facility] [place] Office: [phone] Pager: [phone]

## 2019-08-13 NOTE — Progress Notes (Signed)
Patient unable to void good quantity of urine since foley removed 10/13. Bladder scan at 0130 554. Order for in and  out cath once per Dr. Scot Dock

## 2019-08-13 NOTE — Progress Notes (Signed)
Inpatient Diabetes Program Recommendations  AACE/ADA: New Consensus Statement on Inpatient Glycemic Control (2015)  Target Ranges:  Prepandial:   less than 140 mg/dL      Peak postprandial:   less than 180 mg/dL (1-2 hours)      Critically ill patients:  140 - 180 mg/dL   Lab Results  Component Value Date   GLUCAP 212 (H) 08/13/2019   HGBA1C 8.5 (H) 08/11/2019    Review of Glycemic Control  Diabetes history: DM2 Outpatient Diabetes medications: Basaglar 20 units QHS, glipizide 5 mg bid, Jardiance 10 mg QD Current orders for Inpatient glycemic control: Lantus 20 units QHS, Novolog 0-9 units tidwc, glipizide 5 mg bid  Spoke with pt regarding HgbA1C of 8.5%. Pt monitors blood sugars at home, once every day or two. Discussed importance of monitoring blood sugars at least 2x/day and eating healthy diet with portion control and limit simple CHOs. Pt voices understanding.  Inpatient Diabetes Program Recommendations:     Add Novolog 4 units tidwc for meal coverage insulin.  Will continue to follow.  Thank you. Lorenda Peck, RD, LDN, CDE Inpatient Diabetes Coordinator [phone]

## 2019-08-13 NOTE — Progress Notes (Signed)
Physical Therapy Treatment Patient Details Name: Edward Rocha. MRN: [id number] DOB: 04/10/1966 Today's Date: 08/13/2019    History of Present Illness Pt is a 53 y/o male s/p L common femoral to posterior tibial composite bypass with PTFE and saphenous vein. Noted R UE edema, imaging negative for DVT/SVT. PMH :ARF, DM 2, HTN, PVD, thrombocytopenia.     PT Comments    Pt sat EOB and reports SOB - O2 sats supine at rest 94% and sitting EOB 88% for 5 minutes to return to baseline on room air.  Pt walked 100 feet in halls - end of walk 86%.  I gave pt inspirometer and educated on use - able to get to 1300 ml.  Pt reports 8/10 left thigh and back pain with walking. He had the SOB and back pain prior to admit to the hospital.  I agree with [facility] assist at DC.  Acute PT will follow while in the hospital.   Follow Up Recommendations  Home health PT;Supervision - Intermittent     Equipment Recommendations  Rolling walker with 5" wheels;3in1 (PT)    Recommendations for Other Services       Precautions / Restrictions Precautions Precautions: Fall Precaution Comments: monitor O2 - pt reports SOB prior to hospital Restrictions Weight Bearing Restrictions: No    Mobility  Bed Mobility Overal bed mobility: Needs Assistance Bed Mobility: Supine to Sit           General bed mobility comments: increased time and effort, HOB elevated but no physical assist required  Transfers Overall transfer level: Needs assistance Equipment used: Rolling walker (2 wheeled) Transfers: Sit to/from Stand Sit to Stand: From elevated surface;Min assist         General transfer comment: Pt had hard time getting up and down - trying to keep sore leg in front of him but needed min assist to power up and lower down  Ambulation/Gait Ambulation/Gait assistance: Min guard Gait Distance (Feet): 100 Feet Assistive device: Rolling walker (2 wheeled) Gait Pattern/deviations: Step-to pattern;Decreased stride  length;Decreased stance time - left;Decreased weight shift to left Gait velocity: slow   General Gait Details: Pt worked on getting a more step through gait pattern.  Pt leaning on RW for support - pt has history of back pain - back was hurting same as leg at end of walk today   Stairs             Wheelchair Mobility    Modified Rankin (Stroke Patients Only)       Balance     Sitting balance-Leahy Scale: Good       Standing balance-Leahy Scale: Fair Standing balance comment: pt standing with RW - no loss of balance seen                            Cognition Arousal/Alertness: Awake/alert Behavior During Therapy: WFL for tasks assessed/performed Overall Cognitive Status: Within Functional Limits for tasks assessed                                        Exercises Total Joint Exercises Ankle Circles/Pumps: AROM;20 reps;Seated    General Comments General comments (skin integrity, edema, etc.): Pt continues to have a small area of drainage in thigh incision.      Pertinent Vitals/Pain Pain Assessment: 0-10 Pain Score: 8  Pain Location: L  LE and back Pain Descriptors / Indicators: Aching;Burning;Guarding;Discomfort;Operative site guarding Pain Intervention(s): Limited activity within patient's tolerance;Repositioned;Monitored during session    Home Living                      Prior Function            PT Goals (current goals can now be found in the care plan section) Progress towards PT goals: Progressing toward goals    Frequency    Min 3X/week      PT Plan Current plan remains appropriate    Co-evaluation              AM-PAC PT "6 Clicks" Mobility   Outcome Measure  Help needed turning from your back to your side while in a flat bed without using bedrails?: A Little Help needed moving from lying on your back to sitting on the side of a flat bed without using bedrails?: A Little Help needed moving to  and from a bed to a chair (including a wheelchair)?: A Little Help needed standing up from a chair using your arms (e.g., wheelchair or bedside chair)?: A Little Help needed to walk in hospital room?: A Little Help needed climbing 3-5 steps with a railing? : A Lot 6 Click Score: 17    End of Session Equipment Utilized During Treatment: Gait belt Activity Tolerance: Patient tolerated treatment well Patient left: in chair;with call bell/phone within reach Nurse Communication: Mobility status PT Visit Diagnosis: Unsteadiness on feet (R26.81);Difficulty in walking, not elsewhere classified (R26.2);Pain Pain - Right/Left: Left Pain - part of body: Leg     Time: [id number] PT Time Calculation (min) (ACUTE ONLY): 30 min  Charges:  $Gait Training: 23-37 mins                    08/13/2019   Rande Lawman, PT    Loyal Buba 08/13/2019, 9:46 AM

## 2019-08-13 NOTE — Progress Notes (Signed)
In and out cath 550.

## 2019-08-13 NOTE — Progress Notes (Signed)
Occupational Therapy Treatment Patient Details Name: Edward Rocha. MRN: [id number] DOB: [birth year] Today's Date: 08/13/2019    History of present illness Pt is a 53 y/o male s/p L common femoral to posterior tibial composite bypass with PTFE and saphenous vein. Noted R UE edema, imaging negative for DVT/SVT. PMH :ARF, DM 2, HTN, PVD, thrombocytopenia.    OT comments  Patient supine in bed and agreeable to OT session. Noted increased lethargy today, able to follow commands given increased time but requires cueing for safety awareness and problem solving.  Patient completing bed mobility with supervision and transfers with min guard.  Continues to have difficulty accessing L LE due to pain and limited ROM, but reports spouse will assist.  Requires 2L supplemental O2 today for limited activity, on RA O2 saturation 87-88% and improved on 2L to 90-92%.  At 96% supine on 2L upon therapist exit.  RN aware and present in room.    Follow Up Recommendations  No OT follow up;Supervision - Intermittent    Equipment Recommendations  3 in 1 bedside commode    Recommendations for Other Services      Precautions / Restrictions Precautions Precautions: Fall Precaution Comments: monitor O2 - pt reports SOB prior to hospital Restrictions Weight Bearing Restrictions: No       Mobility Bed Mobility Overal bed mobility: Needs Assistance Bed Mobility: Supine to Sit;Sit to Supine     Supine to sit: Supervision Sit to supine: Supervision   General bed mobility comments: increased time and effort but no phyiscal assist required  Transfers Overall transfer level: Needs assistance Equipment used: Rolling walker (2 wheeled) Transfers: Sit to/from Stand Sit to Stand: From elevated surface;Min guard         General transfer comment: min guard to steady and transition from elevated EOB, increased time and effort    Balance Overall balance assessment: Needs assistance Sitting-balance support:  No upper extremity supported;Feet supported Sitting balance-Leahy Scale: Good     Standing balance support: Bilateral upper extremity supported;During functional activity Standing balance-Leahy Scale: Fair Standing balance comment: relaint on BUE support with RW                           ADL either performed or assessed with clinical judgement   ADL Overall ADL's : Needs assistance/impaired     Grooming: Set up;Sitting                   Toilet Transfer: Min guard;Ambulation;RW Toilet Transfer Details (indicate cue type and reason): simulated in room         Functional mobility during ADLs: Min guard;Rolling walker;Cueing for safety General ADL Comments: pt limited by decreased activity tolerance and pain     Vision       Perception     Praxis      Cognition Arousal/Alertness: Lethargic Behavior During Therapy: Flat affect Overall Cognitive Status: Impaired/Different from baseline Area of Impairment: Safety/judgement;Awareness;Problem solving                         Safety/Judgement: Decreased awareness of safety Awareness: Emergent Problem Solving: Slow processing;Decreased initiation;Difficulty sequencing;Requires verbal cues General Comments: anticipate due to lethargy, but requires increased cueing for safety awareness and techniques today        Exercises     Shoulder Instructions       General Comments small area of drainage in thigh incision, RN aware;  pt on RA upon entry, noted SOB with activity with SpO2 87-88% sitting EOB, placed 2L and O2 maintained 90-92 during activity; RN requested O2 to remain on at rest initally    Pertinent Vitals/ Pain       Pain Assessment: Faces Faces Pain Scale: Hurts even more Pain Location: L LE and back Pain Descriptors / Indicators: Aching;Burning;Guarding;Discomfort;Operative site guarding Pain Intervention(s): Limited activity within patient's tolerance;Monitored during  session;Repositioned  Home Living                                          Prior Functioning/Environment              Frequency  Min 2X/week        Progress Toward Goals  OT Goals(current goals can now be found in the care plan section)  Progress towards OT goals: Not progressing toward goals - comment(lethargic today )  Acute Rehab OT Goals Patient Stated Goal: to get home and have less pain  OT Goal Formulation: With patient  Plan Discharge plan remains appropriate;Frequency remains appropriate    Co-evaluation                 AM-PAC OT "6 Clicks" Daily Activity     Outcome Measure   Help from another person eating meals?: None Help from another person taking care of personal grooming?: A Little Help from another person toileting, which includes using toliet, bedpan, or urinal?: A Little Help from another person bathing (including washing, rinsing, drying)?: A Little Help from another person to put on and taking off regular upper body clothing?: None Help from another person to put on and taking off regular lower body clothing?: A Little 6 Click Score: 20    End of Session Equipment Utilized During Treatment: Rolling walker;Oxygen;Gait belt  OT Visit Diagnosis: Other abnormalities of gait and mobility (R26.89);Pain Pain - Right/Left: Left Pain - part of body: Leg   Activity Tolerance Patient limited by lethargy   Patient Left in bed;with call bell/phone within reach;with nursing/sitter in room   Nurse Communication Mobility status;Other (comment)(drainage, O2)        Time: [id number] OT Time Calculation (min): 25 min  Charges: OT General Charges $OT Visit: 1 Visit OT Treatments $Self Care/Home Management : 23-37 mins  Delight Stare, OT Acute Rehabilitation Services Pager [phone] Office [phone]    Delight Stare 08/13/2019, 3:34 PM

## 2019-08-13 NOTE — Progress Notes (Signed)
Gauze place on incision upper part near groin. Area was oozing minimal drainage

## 2019-08-14 ENCOUNTER — Encounter (HOSPITAL_COMMUNITY): Payer: Self-pay

## 2019-08-14 LAB — RENAL FUNCTION PANEL
Albumin: 2.4 g/dL — ABNORMAL LOW (ref 3.5–5.0)
Anion gap: 12 (ref 5–15)
BUN: 53 mg/dL — ABNORMAL HIGH (ref 6–20)
CO2: 21 mmol/L — ABNORMAL LOW (ref 22–32)
Calcium: 7.7 mg/dL — ABNORMAL LOW (ref 8.9–10.3)
Chloride: 92 mmol/L — ABNORMAL LOW (ref 98–111)
Creatinine, Ser: 3.09 mg/dL — ABNORMAL HIGH (ref 0.61–1.24)
GFR calc Af Amer: 25 mL/min — ABNORMAL LOW (ref >60)
GFR calc non Af Amer: 22 mL/min — ABNORMAL LOW (ref >60)
Glucose, Bld: 173 mg/dL — ABNORMAL HIGH (ref 70–99)
Phosphorus: 4 mg/dL (ref 2.5–4.6)
Potassium: 3.4 mmol/L — ABNORMAL LOW (ref 3.5–5.1)
Sodium: 125 mmol/L — ABNORMAL LOW (ref 135–145)

## 2019-08-14 LAB — KAPPA/LAMBDA LIGHT CHAINS
Kappa free light chain: 152.4 mg/L — ABNORMAL HIGH (ref 3.3–19.4)
Kappa, lambda light chain ratio: 1.68 — ABNORMAL HIGH (ref 0.26–1.65)
Lambda free light chains: 90.7 mg/L — ABNORMAL HIGH (ref 5.7–26.3)

## 2019-08-14 LAB — CBC
HCT: 21.6 % — ABNORMAL LOW (ref 39.0–52.0)
Hemoglobin: 7.8 g/dL — ABNORMAL LOW (ref 13.0–17.0)
MCH: 30.2 pg (ref 26.0–34.0)
MCHC: 36.1 g/dL — ABNORMAL HIGH (ref 30.0–36.0)
MCV: 83.7 fL (ref 80.0–100.0)
Platelets: 60 10*3/uL — ABNORMAL LOW (ref 150–400)
RBC: 2.58 MIL/uL — ABNORMAL LOW (ref 4.22–5.81)
RDW: 13.8 % (ref 11.5–15.5)
WBC: 15.1 10*3/uL — ABNORMAL HIGH (ref 4.0–10.5)
nRBC: 0 % (ref 0.0–0.2)

## 2019-08-14 LAB — GLUCOSE, CAPILLARY
Glucose-Capillary: 192 mg/dL — ABNORMAL HIGH (ref 70–99)
Glucose-Capillary: 182 mg/dL — ABNORMAL HIGH (ref 70–99)
Glucose-Capillary: 176 mg/dL — ABNORMAL HIGH (ref 70–99)
Glucose-Capillary: 176 mg/dL — ABNORMAL HIGH (ref 70–99)

## 2019-08-14 LAB — PROTEIN ELECTROPHORESIS, SERUM
A/G Ratio: 0.9 (ref 0.7–1.7)
Albumin ELP: 2.8 g/dL — ABNORMAL LOW (ref 2.9–4.4)
Alpha-1-Globulin: 0.3 g/dL (ref 0.0–0.4)
Alpha-2-Globulin: 0.5 g/dL (ref 0.4–1.0)
Beta Globulin: 0.8 g/dL (ref 0.7–1.3)
Gamma Globulin: 1.5 g/dL (ref 0.4–1.8)
Globulin, Total: 3 g/dL (ref 2.2–3.9)
Total Protein ELP: 5.8 g/dL — ABNORMAL LOW (ref 6.0–8.5)

## 2019-08-14 LAB — IMMUNOFIXATION, URINE

## 2019-08-14 MED ORDER — SODIUM CHLORIDE 0.9 % IV SOLN
INTRAVENOUS | Status: DC
  Administered 2019-08-14 – 2019-09-09 (×5): via INTRAVENOUS

## 2019-08-14 NOTE — Progress Notes (Signed)
Surgical wound on Pt's left inner thign dressing changed with moderate serosanguinous drainage. Refused to take pain med, stated he can tolerate pain well. Right arm swelling without pain, ultrasound result negative for DVT.  Vital signs remained stable, afebrile, sinus rhythm on monitor.No acute distress note.  Pt got Foley catheter today due to acute urine retention, urine appeared to be hematuria due to Pt has thrombocytopenia. We will continue to monitor urine output and lab Hb from CBC at am.  Kennyth Lose, RN

## 2019-08-14 NOTE — Progress Notes (Signed)
O2 saturation 89-90%.  New sensor applied w/o change.   O2 applied @ via [place] @ 2L.  O2 saturation 96%

## 2019-08-14 NOTE — Progress Notes (Addendum)
Progress Note    08/14/2019 7:38 AM 3 Days Post-Op  Subjective:  Says he has some pain in his thigh;  Says swelling in right arm may be a little better  Afebrile HR 90's NSR [id number] systolic A999333 RA  Vitals:   08/13/19 2225 08/14/19 0420  BP: 123/70 103/63  Pulse: 98 95  Resp: 15 (!) 21  Temp:  98.3 F (36.8 C)  SpO2: 100% 93%    Physical Exam: Cardiac:  regular Lungs:  Non laboared Incisions:  Left groin clean; proximal thigh incision with serous drainage.  Extremities:  Brisk doppler signal left PT>peroneal   CBC    Component Value Date/Time   WBC 15.1 (H) 08/14/2019 0352   RBC 2.58 (L) 08/14/2019 0352   HGB 7.8 (L) 08/14/2019 0352   HCT 21.6 (L) 08/14/2019 0352   PLT 60 (L) 08/14/2019 0352   MCV 83.7 08/14/2019 0352   MCH 30.2 08/14/2019 0352   MCHC 36.1 (H) 08/14/2019 0352   RDW 13.8 08/14/2019 0352   LYMPHSABS 1.2 08/11/2019 1219   MONOABS 0.3 08/11/2019 1219   EOSABS 0.1 08/11/2019 1219   BASOSABS 0.0 08/11/2019 1219    BMET    Component Value Date/Time   NA 125 (L) 08/14/2019 0352   K 3.4 (L) 08/14/2019 0352   CL 92 (L) 08/14/2019 0352   CO2 21 (L) 08/14/2019 0352   GLUCOSE 173 (H) 08/14/2019 0352   BUN 53 (H) 08/14/2019 0352   CREATININE 3.09 (H) 08/14/2019 0352   CALCIUM 7.7 (L) 08/14/2019 0352   GFRNONAA 22 (L) 08/14/2019 0352   GFRAA 25 (L) 08/14/2019 0352    INR    Component Value Date/Time   INR 1.2 08/07/2019 0846     Intake/Output Summary (Last 24 hours) at 08/14/2019 0738 Last data filed at 08/14/2019 0408 Gross per 24 hour  Intake 3495.43 ml  Output 975 ml  Net 2520.43 ml     Assessment:  53 y.o. male is s/p:   L femoral to PTA bypass with composite  3 Days Post-Op  Plan: -pt with brisk doppler signal left PT>peroneal -AKI-continues to increase with decreased output.  Appreciate nephrology's assistance  -pt does have serous drainage from proximal thigh saphenous vein harvest.  He is having pain there, but  does not appear infected.  Leukocytosis most likely reactive to peri-op.  Continue to monitor.   -acute blood loss anemia with hgb 7.8.  Will defer to renal for transfusion. -thrombocytopenia decreasing.  May need HIT panel if not already drawn. -DVT prophylaxis:  May need to dc sq heparin and draw HIT given decreased platelets and use SCD's.    Leontine Locket, PA-C [facility] [phone] 08/14/2019 7:38 AM  I have seen and evaluated the patient. I agree with the PA note as documented above.  Postop day 3 status post left common femoral to PT bypass with composite PTFE and GSV.  Continues to have a very brisk posterior tibial signal in the left foot.  Now with AKI creatinine up to 3.09 today.  Appreciate nephrology seeing him yesterday.  Multifactorial etiology likely but unclear.  We have tried gentle hydration last several days.  I will saline lock him this morning unless nephrology recommends ongoing hydration.  Hemoglobin 7.8 with acute blood loss anemia following surgery.  Will defer to nephrology if they feel he needs to be transfused.  Has chronic thrombocytopenia that was worked up by hematology prior to surgery and previously had trial of steroids with no  improvement.  Platelets are 60 which was his baseline from before.   No indication for transfusion, no active bleeding.  Urine output yesterday 975.  He has a foley catheter in for urinary retention post-op and will keep that with strict I&O.  OOb and walked yesterday.  [name], MD [facility] [place] Office: [phone] Pager: [phone]

## 2019-08-14 NOTE — Progress Notes (Signed)
Physical Therapy Treatment Patient Details Name: Edward Rocha. MRN: [id number] DOB: 03/18/1966 Today's Date: 08/14/2019    History of Present Illness Pt is a 53 y/o male s/p L common femoral to posterior tibial composite bypass with PTFE and saphenous vein. Noted R UE edema, imaging negative for DVT/SVT. PMH :ARF, DM 2, HTN, PVD, thrombocytopenia.     PT Comments    Pt was seen for mobility and note both that L thigh is draining serosanguinous fluid on bed and that O2 sats declined during standing and gait.  Pt had O2 sat monitor off his hand and so returned it when he reported being light headed.  Sats were 93% at rest, declined to 86% on room air and then recovered to 89% sitting.  Had declines to 84% after in the bed, repositioned and talked with nursing about his values.  Will continue to monitor him for need for O2 and assess whether home is still a viable solution.   Follow Up Recommendations  Home health PT;Supervision/Assistance - 24 hour     Equipment Recommendations  Rolling walker with 5" wheels;3in1 (PT)    Recommendations for Other Services       Precautions / Restrictions Precautions Precautions: Fall Precaution Comments: monitor O2 - pt reports SOB prior to hospital Restrictions Weight Bearing Restrictions: No    Mobility  Bed Mobility Overal bed mobility: Needs Assistance Bed Mobility: Supine to Sit;Sit to Supine     Supine to sit: Mod assist Sit to supine: Mod assist   General bed mobility comments: pt is weaker and not feeling well todya  Transfers Overall transfer level: Needs assistance Equipment used: Rolling walker (2 wheeled) Transfers: Sit to/from Stand Sit to Stand: From elevated surface;Mod assist            Ambulation/Gait Ambulation/Gait assistance: Min assist   Assistive device: Rolling walker (2 wheeled) Gait Pattern/deviations: Step-through pattern;Shuffle;Decreased stride length Gait velocity: reduced   General Gait  Details: pt was feeling light headed so returned to the bed, and noted his O2 sat was 82% with the effort   Stairs             Wheelchair Mobility    Modified Rankin (Stroke Patients Only)       Balance Overall balance assessment: Needs assistance Sitting-balance support: Feet supported;Single extremity supported Sitting balance-Leahy Scale: Good     Standing balance support: Bilateral upper extremity supported;During functional activity Standing balance-Leahy Scale: Poor Standing balance comment: more support needed today on UE's to stand than last PT visit                            Cognition Arousal/Alertness: Lethargic Behavior During Therapy: Flat affect Overall Cognitive Status: Impaired/Different from baseline Area of Impairment: Problem solving;Awareness;Safety/judgement;Following commands;Attention                   Current Attention Level: Selective   Following Commands: Follows one step commands inconsistently;Follows one step commands with increased time Safety/Judgement: Decreased awareness of safety;Decreased awareness of deficits Awareness: Intellectual;Emergent Problem Solving: Slow processing;Requires verbal cues General Comments: pt is struggling to decide where his limits are      Exercises      General Comments General comments (skin integrity, edema, etc.): pt on room air, noted he recovered to 91% with rest from walk but was unsteady with all effort      Pertinent Vitals/Pain Pain Location: L LE and back Pain Descriptors /  Indicators: Operative site guarding;Grimacing    Home Living                      Prior Function            PT Goals (current goals can now be found in the care plan section) Acute Rehab PT Goals Patient Stated Goal: to get home and have less pain  Progress towards PT goals: Progressing toward goals    Frequency    Min 3X/week      PT Plan Discharge plan needs to be updated     Co-evaluation              AM-PAC PT "6 Clicks" Mobility   Outcome Measure  Help needed turning from your back to your side while in a flat bed without using bedrails?: A Lot Help needed moving from lying on your back to sitting on the side of a flat bed without using bedrails?: A Lot Help needed moving to and from a bed to a chair (including a wheelchair)?: A Lot Help needed standing up from a chair using your arms (e.g., wheelchair or bedside chair)?: A Lot Help needed to walk in hospital room?: A Little Help needed climbing 3-5 steps with a railing? : A Lot 6 Click Score: 13    End of Session Equipment Utilized During Treatment: Gait belt Activity Tolerance: Treatment limited secondary to medical complications (Comment);Patient limited by fatigue Patient left: in bed;with call bell/phone within reach;with bed alarm set Nurse Communication: Mobility status PT Visit Diagnosis: Unsteadiness on feet (R26.81);Difficulty in walking, not elsewhere classified (R26.2);Pain Pain - Right/Left: Left Pain - part of body: Leg     Time: 1137-1202 PT Time Calculation (min) (ACUTE ONLY): 25 min  Charges:  $Gait Training: 8-22 mins $Therapeutic Activity: 8-22 mins                    Ramond Dial 08/14/2019, 5:08 PM   Mee Hives, PT MS Acute Rehab Dept. Number: [place] and [place]

## 2019-08-14 NOTE — TOC Initial Note (Signed)
Transition of Care (TOC) - Initial/Assessment Note  Marvetta Gibbons RN, BSN Transitions of Care Unit 4E- RN Case Manager [phone]   Patient Details  Name: Edward Rocha. MRN: [id number] Date of Birth: [birth year]  Transition of Care [facility]) CM/SW Contact:    Dawayne Patricia, RN Phone Number: 08/14/2019, 3:54 PM  Clinical Narrative:                 Pt admitted s/p fempop with post procedure AKI, orders placed for HHPT and DME needs- CM spoke with pt regarding transition needs- address, phone #s and PCP all confirmed with pt in epic. List provided for [facility] choice Per CMS guidelines from medicare.gov website with star ratings (copy placed in shadow chart)- pt has [employer] and will need to go through [place] [facility] to contract with a [place] agency- process explained to pt- pt does not have a preference for [facility] agency to give to [facility]- Per pt he has RW at home but will need 3n1 for home- call made to [facility] with [facility]- DME to be delivered to room prior to discharge (please call for 3n1 on d/c). Call made to [facility] for [facility] referral needs- spoke with [place] provided for HHPT needs- [facility] will f/u on referral for HHPT- and reach out to pt when agency found to contract for services. Will plan tentative date for [facility] as 10/19. - Intake Referral #- [id number]   Expected Discharge Plan: [place] Barriers to Discharge: Continued Medical Work up   Patient Goals and CMS Choice Patient states their goals for this hospitalization and ongoing recovery are:: return home CMS Medicare.gov Compare Post Acute Care list provided to:: Patient Choice offered to / list presented to : Patient  Expected Discharge Plan and Services Expected Discharge Plan: [place]   Discharge Planning Services: CM Consult Post Acute Care Choice: Durable Medical Equipment, Home Health Living arrangements for the past 2 months: Single Family Home        DME Arranged: 3-N-1, Walker rolling DME Agency: [employer] Date DME Agency Contacted: 08/14/19 Time DME Agency Contacted: [phone] Representative spoke with at DME Agency: Thedore Mins HH Arranged: PT Eleena Grater: Other - See comment Date [place]: 08/14/19 Time [place]: [place] Representative spoke with at [place]  Prior Living Arrangements/Services Living arrangements for the past 2 months: [place] Lives with:: Spouse Patient language and need for interpreter reviewed:: Yes Do you feel safe going back to the place where you live?: Yes      Need for Family Participation in Patient Care: Yes (Comment) Care giver support system in place?: Yes (comment) Current home services: DME Criminal Activity/Legal Involvement Pertinent to Current Situation/Hospitalization: No - Comment as needed  Activities of Daily Living      Permission Sought/Granted Permission sought to share information with : Investment banker, corporate granted to share info w AGENCY: [facility]        Emotional Assessment Appearance:: Appears stated age Attitude/Demeanor/Rapport: Engaged Affect (typically observed): Appropriate, Pleasant Orientation: : Oriented to Self, Oriented to Place, Oriented to  Time, Oriented to Situation Alcohol / Substance Use: Not Applicable Psych Involvement: No (comment)  Admission diagnosis:  CLAUDICATION Patient Active Problem List   Diagnosis Date Noted  . PAD (peripheral artery disease) ([place]) 08/11/2019  . Thrombocytopenia ([place]) 05/29/2019  . Diabetes ([place]) 05/29/2019  . Critical lower limb  ischemia 04/22/2019   PCP:  Neale Burly, MD Pharmacy:   [facility] [place] [facility] [place] [address] [place] [address] Phone: [phone] Fax: [phone]     Social Determinants of Health (SDOH) Interventions    Readmission  Risk Interventions No flowsheet data found.

## 2019-08-14 NOTE — Progress Notes (Addendum)
Patient ID: Edward Patricia., male   DOB: 09-06-1966, 53 y.o.   MRN: [id number] S: Feeling better today O:BP 99/64   Pulse 91   Temp 98.5 F (36.9 C) (Oral)   Resp 17   Ht 6\' 1"  (1.854 m)   Wt 98.4 kg   SpO2 90%   BMI 28.63 kg/m   Intake/Output Summary (Last 24 hours) at 08/14/2019 1058 Last data filed at 08/14/2019 0408 Gross per 24 hour  Intake 2867.1 ml  Output 975 ml  Net 1892.1 ml   Intake/Output: I/O last 3 completed shifts: In: 4429.9 [P.O.:1720; I.V.:2159.9; Other:550] Out: 1525 [id number]  Intake/Output this shift:  No intake/output data recorded. Weight change:  Gen: NAD CVS: no rub Resp: cta Abd: +BS, soft, NT/ND Ext: 1+ edema of left leg GU: foley in place  Recent Labs  Lab 08/11/19 1238 08/11/19 1813 08/12/19 0015 08/13/19 0455 08/14/19 0352  NA 132*  --  129* 125* 125*  K 4.0  --  4.3 4.2 3.4*  CL  --   --  95* 92* 92*  CO2  --   --  20* 21* 21*  GLUCOSE 197*  --  200* 152* 173*  BUN  --   --  24* 40* 53*  CREATININE  --  1.78* 1.97* 2.73* 3.09*  ALBUMIN  --   --   --   --  2.4*  CALCIUM  --   --  8.1* 7.6* 7.7*  PHOS  --   --   --   --  4.0   Liver Function Tests: Recent Labs  Lab 08/14/19 0352  ALBUMIN 2.4*   No results for input(s): LIPASE, AMYLASE in the last 168 hours. No results for input(s): AMMONIA in the last 168 hours. CBC: Recent Labs  Lab 08/11/19 1219  08/11/19 1813 08/12/19 0015 08/14/19 0352  WBC 9.0  --  18.3* 14.7* 15.1*  NEUTROABS 7.3  --   --   --   --   HGB 7.5*   < > 10.6* 9.9* 7.8*  HCT 22.1*   < > 29.4* 27.7* 21.6*  MCV 85.3  --  82.8 83.7 83.7  PLT 55*  --  72* 68* 60*   < > = values in this interval not displayed.   Cardiac Enzymes: No results for input(s): CKTOTAL, CKMB, CKMBINDEX, TROPONINI in the last 168 hours. CBG: Recent Labs  Lab 08/13/19 0635 08/13/19 1257 08/13/19 1654 08/13/19 2121 08/14/19 0610  GLUCAP 158* 212* 187* 186* 192*    Iron Studies: No results for input(s): IRON, TIBC,  TRANSFERRIN, FERRITIN in the last 72 hours. Studies/Results: US Renal  Result Date: 08/13/2019 CLINICAL DATA:  53 year old male with acute renal insufficiency. EXAM: RENAL / URINARY TRACT ULTRASOUND COMPLETE COMPARISON:  Right upper quadrant ultrasound dated 06/17/2019 and CT of the abdomen pelvis dated 12/12/2016 FINDINGS: Right Kidney: Renal measurements: 10.6 x 5.4 x 6.0 cm = volume: 178 mL. Normal echogenicity. No hydronephrosis or shadowing stone. Left Kidney: Renal measurements: 12.2 x 4.7 x 5.2 cm = volume: 154 mL. Normal echogenicity. No hydronephrosis or shadowing stone. Bladder: The urinary bladder is decompressed around a Foley catheter. Partially visualized bilateral pleural effusions. IMPRESSION: 1. Unremarkable renal ultrasound. 2. Partially visualized bilateral pleural effusions. Electronically Signed   By: Anner Crete M.D.   On: 08/13/2019 16:02   . sodium chloride   Intravenous Once  . aspirin EC  81 mg Oral Q0600  . atorvastatin  80 mg Oral Daily  . Chlorhexidine Gluconate  Cloth  6 each Topical Daily  . chlorthalidone  50 mg Oral Daily  . clopidogrel  75 mg Oral Daily  . docusate sodium  100 mg Oral Daily  . gabapentin  300 mg Oral TID  . glipiZIDE  5 mg Oral BID AC  . heparin  5,000 Units Subcutaneous Q8H  . insulin aspart  0-9 Units Subcutaneous TID WC  . insulin glargine  20 Units Subcutaneous QPM  . labetalol  300 mg Oral BID  . mupirocin ointment  1 application Nasal BID  . NIFEdipine  90 mg Oral Daily  . pantoprazole  40 mg Oral Daily    BMET    Component Value Date/Time   NA 125 (L) 08/14/2019 0352   K 3.4 (L) 08/14/2019 0352   CL 92 (L) 08/14/2019 0352   CO2 21 (L) 08/14/2019 0352   GLUCOSE 173 (H) 08/14/2019 0352   BUN 53 (H) 08/14/2019 0352   CREATININE 3.09 (H) 08/14/2019 0352   CALCIUM 7.7 (L) 08/14/2019 0352   GFRNONAA 22 (L) 08/14/2019 0352   GFRAA 25 (L) 08/14/2019 0352   CBC    Component Value Date/Time   WBC 15.1 (H) 08/14/2019 0352    RBC 2.58 (L) 08/14/2019 0352   HGB 7.8 (L) 08/14/2019 0352   HCT 21.6 (L) 08/14/2019 0352   PLT 60 (L) 08/14/2019 0352   MCV 83.7 08/14/2019 0352   MCH 30.2 08/14/2019 0352   MCHC 36.1 (H) 08/14/2019 0352   RDW 13.8 08/14/2019 0352   LYMPHSABS 1.2 08/11/2019 1219   MONOABS 0.3 08/11/2019 1219   EOSABS 0.1 08/11/2019 1219   BASOSABS 0.0 08/11/2019 1219     Assessment/Plan: 1.  AKI- likely multifactorial:  Ischemic atn with ABLA and relative hypotension as well as contrast nephropathy and likely bladder outlet obstruction.  Also need to rule out myeloma given profound AKI with small amount of iv contrast. 1. Renal US without obstruction and FeNa <1% consistent with pre-renal, will start IVF's and follow.  2. Improved UOP with replacing foley given BOO and large PVR 3. Rate of rise slowing and hopefully has reached a plateau and will start to see improvement tomorrow.  4. No indication for dialysis at this time. 2. ABLA- he was transfused 2 units PRBCs post-op.  Continue to follow and transfuse prn 3. HTN- elevated and may need to add coreg or increase nifedipine. 4. PAD- s/p left fem to post tibial artery bypass graft.  Per Dr. Carlis Abbott. 5. DM type 2- poorly controlled.  Agree with holding invokana with AKI and follow. 6. Deconditioning- working with PT  Donetta Potts, MD [facility] [phone]

## 2019-08-15 ENCOUNTER — Inpatient Hospital Stay (HOSPITAL_COMMUNITY): Payer: Managed Care, Other (non HMO)

## 2019-08-15 LAB — GLUCOSE, CAPILLARY
Glucose-Capillary: 164 mg/dL — ABNORMAL HIGH (ref 70–99)
Glucose-Capillary: 159 mg/dL — ABNORMAL HIGH (ref 70–99)
Glucose-Capillary: 176 mg/dL — ABNORMAL HIGH (ref 70–99)
Glucose-Capillary: 216 mg/dL — ABNORMAL HIGH (ref 70–99)
Glucose-Capillary: 201 mg/dL — ABNORMAL HIGH (ref 70–99)

## 2019-08-15 LAB — RENAL FUNCTION PANEL
Albumin: 2.2 g/dL — ABNORMAL LOW (ref 3.5–5.0)
Anion gap: 10 (ref 5–15)
BUN: 59 mg/dL — ABNORMAL HIGH (ref 6–20)
CO2: 21 mmol/L — ABNORMAL LOW (ref 22–32)
Calcium: 7.7 mg/dL — ABNORMAL LOW (ref 8.9–10.3)
Chloride: 92 mmol/L — ABNORMAL LOW (ref 98–111)
Creatinine, Ser: 2.75 mg/dL — ABNORMAL HIGH (ref 0.61–1.24)
GFR calc Af Amer: 29 mL/min — ABNORMAL LOW (ref >60)
GFR calc non Af Amer: 25 mL/min — ABNORMAL LOW (ref >60)
Glucose, Bld: 166 mg/dL — ABNORMAL HIGH (ref 70–99)
Phosphorus: 4.3 mg/dL (ref 2.5–4.6)
Potassium: 3.5 mmol/L (ref 3.5–5.1)
Sodium: 123 mmol/L — ABNORMAL LOW (ref 135–145)

## 2019-08-15 LAB — CBC
HCT: 21.8 % — ABNORMAL LOW (ref 39.0–52.0)
Hemoglobin: 7.8 g/dL — ABNORMAL LOW (ref 13.0–17.0)
MCH: 29.7 pg (ref 26.0–34.0)
MCHC: 35.8 g/dL (ref 30.0–36.0)
MCV: 82.9 fL (ref 80.0–100.0)
Platelets: 60 10*3/uL — ABNORMAL LOW (ref 150–400)
RBC: 2.63 MIL/uL — ABNORMAL LOW (ref 4.22–5.81)
RDW: 13.5 % (ref 11.5–15.5)
WBC: 13.3 10*3/uL — ABNORMAL HIGH (ref 4.0–10.5)
nRBC: 0.2 % (ref 0.0–0.2)

## 2019-08-15 LAB — OSMOLALITY: Osmolality: 283 mOsm/kg (ref 275–295)

## 2019-08-15 MED ORDER — SODIUM CHLORIDE 0.9% FLUSH
3.0000 mL | Freq: Two times a day (BID) | INTRAVENOUS | Status: DC
  Administered 2019-08-15 – 2019-08-22 (×12): 3 mL via INTRAVENOUS
  Administered 2019-08-22: 6 mL via INTRAVENOUS
  Administered 2019-08-23: 10 mL via INTRAVENOUS
  Administered 2019-08-23 – 2019-09-09 (×14): 3 mL via INTRAVENOUS

## 2019-08-15 MED ORDER — FUROSEMIDE 40 MG PO TABS
40.0000 mg | ORAL_TABLET | Freq: Every day | ORAL | Status: DC
  Administered 2019-08-15 – 2019-08-16 (×2): 40 mg via ORAL
  Filled 2019-08-15 (×2): qty 1

## 2019-08-15 MED ORDER — SODIUM CHLORIDE 0.9 % IV SOLN
250.0000 mL | INTRAVENOUS | Status: DC | PRN
  Administered 2019-08-22: 35 mL via INTRAVENOUS

## 2019-08-15 MED ORDER — FUROSEMIDE 10 MG/ML IJ SOLN
20.0000 mg | Freq: Once | INTRAMUSCULAR | Status: AC
  Administered 2019-08-15: 20 mg via INTRAVENOUS
  Filled 2019-08-15: qty 2

## 2019-08-15 MED ORDER — SODIUM CHLORIDE 0.9% FLUSH
3.0000 mL | INTRAVENOUS | Status: DC | PRN
  Administered 2019-08-21 – 2019-09-03 (×4): 3 mL via INTRAVENOUS
  Filled 2019-08-15 (×4): qty 3

## 2019-08-15 NOTE — Progress Notes (Signed)
Physical Therapy Treatment Patient Details Name: Edward Rocha. MRN: [id number] DOB: Aug 03, 1966 Today's Date: 08/15/2019    History of Present Illness Pt is a 53 y/o male s/p L common femoral to posterior tibial composite bypass with PTFE and saphenous vein. Noted R UE edema, imaging negative for DVT/SVT. PMH :ARF, DM 2, HTN, PVD, thrombocytopenia.     PT Comments    Patient not progressing towards PT goals today. Noted to have shortness of breath, worsened with activity. Requires Max A of 2 for bed mobility today. Limited due to dizziness, drop in BP and drop in Sp02 to 64% on 2-4L/min 02 despite cues for pursed lip breathing. Rn notified of respiration. Discharge recommendation updated due to downgrade in physical and respiratory status. Will follow.   Follow Up Recommendations  CIR;Supervision for mobility/OOB     Equipment Recommendations  Rolling walker with 5" wheels;3in1 (PT)    Recommendations for Other Services       Precautions / Restrictions Precautions Precautions: Fall Precaution Comments: monitor O2 Restrictions Weight Bearing Restrictions: No    Mobility  Bed Mobility Overal bed mobility: Needs Assistance Bed Mobility: Supine to Sit;Sit to Supine     Supine to sit: Max assist;+2 for physical assistance Sit to supine: Max assist;+2 for physical assistance   General bed mobility comments: MAX A +2 to elevate trunk and scoot hips forward; MAX A +2 to lift BLEs back to bed after hypotensive episode.  Transfers                 General transfer comment: unable to transfer d/t hypotension and desaturation and dizziness.  Ambulation/Gait                 Stairs             Wheelchair Mobility    Modified Rankin (Stroke Patients Only)       Balance Overall balance assessment: Needs assistance Sitting-balance support: Feet supported;Bilateral upper extremity supported;Single extremity supported Sitting balance-Leahy Scale:  Poor Sitting balance - Comments: required MOD - MIN A to maintain sitting balance. Sp02 dropped to what read 64% on 4L/min 02. Pt reports dizziness.                                    Cognition Arousal/Alertness: Awake/alert Behavior During Therapy: Flat affect Overall Cognitive Status: Impaired/Different from baseline Area of Impairment: Problem solving;Awareness                           Awareness: Emergent Problem Solving: Slow processing General Comments: slow to process and initiate movement.      Exercises      General Comments General comments (skin integrity, edema, etc.): Started on 2L/,min 02 at beginning and increased to 4L due to Sp02 dropping to 64%. Rn notified. BP pre activity 117/62, post activity BP 96/83.      Pertinent Vitals/Pain Pain Assessment: Faces Faces Pain Scale: Hurts little more Pain Location: LLE Pain Descriptors / Indicators: Tightness Pain Intervention(s): Repositioned;Monitored during session;Limited activity within patient's tolerance    Home Living                      Prior Function            PT Goals (current goals can now be found in the care plan section) Acute Rehab PT Goals Patient  Stated Goal: to get home and have less pain  Progress towards PT goals: Not progressing toward goals - comment(due to SOB, drop in Sp02, dizziness.)    Frequency    Min 3X/week      PT Plan Discharge plan needs to be updated    Co-evaluation PT/OT/SLP Co-Evaluation/Treatment: Yes Reason for Co-Treatment: Complexity of the patient's impairments (multi-system involvement);For patient/therapist safety;To address functional/ADL transfers PT goals addressed during session: Mobility/safety with mobility        AM-PAC PT "6 Clicks" Mobility   Outcome Measure  Help needed turning from your back to your side while in a flat bed without using bedrails?: A Lot Help needed moving from lying on your back to  sitting on the side of a flat bed without using bedrails?: Total Help needed moving to and from a bed to a chair (including a wheelchair)?: A Lot Help needed standing up from a chair using your arms (e.g., wheelchair or bedside chair)?: A Lot Help needed to walk in hospital room?: A Lot Help needed climbing 3-5 steps with a railing? : Total 6 Click Score: 10    End of Session Equipment Utilized During Treatment: Gait belt Activity Tolerance: Treatment limited secondary to medical complications (Comment)(drop in Sp02) Patient left: in bed;with call bell/phone within reach;with bed alarm set Nurse Communication: Mobility status PT Visit Diagnosis: Unsteadiness on feet (R26.81);Difficulty in walking, not elsewhere classified (R26.2);Pain Pain - Right/Left: Left Pain - part of body: Leg     Time: 1203-1226 PT Time Calculation (min) (ACUTE ONLY): 23 min  Charges:  $Therapeutic Activity: 8-22 mins                     Wray Kearns, PT, DPT Acute Rehabilitation Services Pager [phone] Office [place] 08/15/2019, 5:15 PM

## 2019-08-15 NOTE — Progress Notes (Signed)
RN called with concerns that pt is now requiring more oxygen. On arrival to pt's room spo2 84% on 5L . Pt seems to be SOB but denies at this time. Pt placed on 12L HFNC. Spo2 now 92%. Breath sounds diminished & coarse crackles bilaterally. Per RN this is also new. MD called to make aware of changes. Order is in for transfer to ICU. RT will continue to monitor

## 2019-08-15 NOTE — Progress Notes (Signed)
Pharmacy Heparin Induced Thrombocytopenia (HIT) Note:  Edward Rocha. is an 53 y.o. male being evaluated for HIT. Heparin was started 10/12 for VTE ppx and VVS surgery, and baseline platelets were 72.   HIT labs were ordered on 10/16 when platelets dropped to 60.  Auto-populate labs: No results found for: HEPINDPLTAB, SRALOWDOSEHP, SRAHIGHDOSEH   CALCULATE SCORE:  4Ts (see the HIT Algorithm) Score  Thrombocytopenia 0  Timing 0  Thrombosis 0  Other causes of thrombocytopenia 1  Total 1     Recommendations (A or B) are based on available lab results (HIT antibody and/or SRA) and the HIT algorithm    A. HIT antibody result available  Possible HIT    Order SRA:  No  Discontinue heparin / LMWH:  Yes  Initiate alternative anticoagulation:  No  Document heparin allergy:  Yes    B. SRA result availability  SRA not available     Comments (List any alternative plans or if there are contraindications to therapy)   Arrie Senate, PharmD, BCPS Clinical Pharmacist [phone] Please check AMION for all [place] numbers 08/15/2019

## 2019-08-15 NOTE — Progress Notes (Addendum)
Labetalol 300 mg at bed time was not given due to BP 96/54 mmHg, HR 94. Sinus rhythm on monitor. When I assessed,Pt appeared lethargic and sleepy. He had been sleepy today since am, per RN day shift reported.   He remained afebrile. Tachypnea occasionally when ambulated, turned on his bed, room air or at [facility] flat, SPO2 87%. Diminish breath sound lower bilateral. O2 NCL 2 LPM given since day shift  to maintain SPO2> 93%.  His urine output from Foley cath was tea color with some sediment and blood clot. MD aware.   Patent Dorsalis pedis pulses and posterior Tibial pulses by doppler. Inner upper thing left side with large serosanguinous drainage, clean with Betadine and new dry dressing with gauzes and abdominal pad applied.   Continue to monitor.    Kennyth Lose, RN

## 2019-08-15 NOTE — Progress Notes (Signed)
Occupational Therapy Treatment Patient Details Name: Edward Rocha. MRN: [id number] DOB: 08/03/1966 Today's Date: 08/15/2019    History of present illness Pt is a 53 y/o male s/p L common femoral to posterior tibial composite bypass with PTFE and saphenous vein. Noted R UE edema, imaging negative for DVT/SVT. PMH :ARF, DM 2, HTN, PVD, thrombocytopenia.    OT comments  Pt required MAX A +2 for bed mobility to transition from supine to EOB this session. Pt needed MIN-MOD A to maintain sitting balance EOB; pt on 2 L O2 at start of session reporting dizziness ( see vitals below) at EOB with O2 dropping into 60s; bumped up pt to 4L while sitting EOB- RN aware.Returned pt to supine requiring MAX A +2. Will continue to follow acutely for OT needs. Updated DC rec as pt with decline in functional status. Feel pt would benefit from structured environment of CIR contingent on progress towards OT goals.  Will let OTR know about change in POC.   Supine: 117/62 O2- 89 EOB: 96/83 O2 60s Supine: 131/72 O2 90  Follow Up Recommendations  Home health OT;Supervision/Assistance - 24 hour    Equipment Recommendations  3 in 1 bedside commode    Recommendations for Other Services      Precautions / Restrictions Precautions Precautions: Fall Precaution Comments: monitor O2 - pt reports SOB prior to hospital Restrictions Weight Bearing Restrictions: No       Mobility Bed Mobility Overal bed mobility: Needs Assistance Bed Mobility: Supine to Sit;Sit to Supine     Supine to sit: Max assist;+2 for physical assistance Sit to supine: Max assist;+2 for physical assistance   General bed mobility comments: MAX A +2 to elevate trunk and scoot hips forward; MAX A +2 to lift BLEs back to bed after hypotensive episode  Transfers                 General transfer comment: unable to transfer d/t hypotension and desaturation    Balance Overall balance assessment: Needs assistance Sitting-balance  support: Single extremity supported;Bilateral upper extremity supported Sitting balance-Leahy Scale: Poor Sitting balance - Comments: required MOD - MIN A to maintain sitting balance                                   ADL either performed or assessed with clinical judgement   ADL Overall ADL's : Needs assistance/impaired                                       General ADL Comments: pt limited by hypotension and O2 desaturation upon sitting EOB     Vision       Perception     Praxis      Cognition Arousal/Alertness: Awake/alert Behavior During Therapy: Flat affect Overall Cognitive Status: Impaired/Different from baseline Area of Impairment: Problem solving;Awareness                           Awareness: Emergent Problem Solving: Slow processing General Comments: slow to process and initiate movement; likely d/t desaturation/ hyptension with movement        Exercises     Shoulder Instructions       General Comments pt on 2L SaO2 at start of session; increased to 4L while sitting EOB as pt desating  into 60s    Pertinent Vitals/ Pain       Pain Assessment: Faces Faces Pain Scale: Hurts little more Pain Location: LLE Pain Descriptors / Indicators: Tightness Pain Intervention(s): Monitored during session;Repositioned;Limited activity within patient's tolerance  Home Living                                          Prior Functioning/Environment              Frequency  Min 2X/week        Progress Toward Goals  OT Goals(current goals can now be found in the care plan section)  Progress towards OT goals: Not progressing toward goals - comment(hypotensive and desaturating into 60s)  Acute Rehab OT Goals Patient Stated Goal: to get home and have less pain  OT Goal Formulation: With patient Time For Goal Achievement: 08/26/19 Potential to Achieve Goals: Good  Plan Discharge plan remains appropriate     Co-evaluation                 AM-PAC OT "6 Clicks" Daily Activity     Outcome Measure   Help from another person eating meals?: None Help from another person taking care of personal grooming?: A Little Help from another person toileting, which includes using toliet, bedpan, or urinal?: A Lot Help from another person bathing (including washing, rinsing, drying)?: A Lot Help from another person to put on and taking off regular upper body clothing?: A Little Help from another person to put on and taking off regular lower body clothing?: A Lot 6 Click Score: 16    End of Session Equipment Utilized During Treatment: Oxygen  OT Visit Diagnosis: Other abnormalities of gait and mobility (R26.89);Pain Pain - Right/Left: Left Pain - part of body: Leg   Activity Tolerance Other (comment)(limtied by hypotension and O2 desaturation)   Patient Left in bed;with call bell/phone within reach   Nurse Communication Mobility status;Other (comment)(hypotensive and desating into 60s)        Time: 1204-1226 OT Time Calculation (min): 22 min  Charges: OT General Charges $OT Visit: 1 Visit OT Treatments $Therapeutic Activity: 8-22 mins  [place], [place] Acute Rehabilitation Services [phone] [place] 08/15/2019, 1:34 PM

## 2019-08-15 NOTE — Progress Notes (Signed)
Patient had increased O2 requirement this afternoon with difficulty breathing IVF stopped and PO and IV lasix given CXR pending Plan to transfer to ICU for closer monitoring  [place]s BRabham

## 2019-08-15 NOTE — Progress Notes (Signed)
Pt's O2 saturation began to worsen after PT today.  Pt's O2 became 82-86% with no significant response to an increase from 2L to 5L on nasal cannula.  Pt also now presenting with bilateral course crackles during lung auscultation.  Respiratory therapy and [place] MD notified.  Pt switched to High Flow at 12L and O2 sat now stable at 92%.  Verbal order to transfer Pt to [place] executed.  Currently awaiting tranfer placement.  Will continue to monitor pt.

## 2019-08-15 NOTE — Progress Notes (Signed)
Call received from DJ with [facility] - they continue to try to find agency to contract with for [facility] services. Will see if moving [place] date to 10/21 will help to secure agency for [facility] needs.

## 2019-08-15 NOTE — Progress Notes (Addendum)
Patient ID: Edward Patricia., male   DOB: 1966/10/24, 53 y.o.   MRN: [id number] S: Feels better but now on oxygen O:BP 116/62   Pulse 98   Temp 99.1 F (37.3 C) (Oral)   Resp (!) 24   Ht 6\' 1"  (1.854 m)   Wt 98.4 kg   SpO2 92%   BMI 28.63 kg/m   Intake/Output Summary (Last 24 hours) at 08/15/2019 1400 Last data filed at 08/15/2019 1113 Gross per 24 hour  Intake 1616.26 ml  Output 1100 ml  Net 516.26 ml   Intake/Output: I/O last 3 completed shifts: In: 3221.1 [P.O.:1750; I.V.:1471.1] Out: 1225 [Urine:1225]  Intake/Output this shift:  Total I/O In: -  Out: 300 [Urine:300] Weight change:  Gen: NAD CVS: no rub Resp: bilateral crackles Abd: +BS, soft, NT/ND Ext: 1+ edema, staples intact, no erythema  Recent Labs  Lab 08/11/19 1238 08/11/19 1813 08/12/19 0015 08/13/19 0455 08/14/19 0352 08/15/19 0224  NA 132*  --  129* 125* 125* 123*  K 4.0  --  4.3 4.2 3.4* 3.5  CL  --   --  95* 92* 92* 92*  CO2  --   --  20* 21* 21* 21*  GLUCOSE 197*  --  200* 152* 173* 166*  BUN  --   --  24* 40* 53* 59*  CREATININE  --  1.78* 1.97* 2.73* 3.09* 2.75*  ALBUMIN  --   --   --   --  2.4* 2.2*  CALCIUM  --   --  8.1* 7.6* 7.7* 7.7*  PHOS  --   --   --   --  4.0 4.3   Liver Function Tests: Recent Labs  Lab 08/14/19 0352 08/15/19 0224  ALBUMIN 2.4* 2.2*   No results for input(s): LIPASE, AMYLASE in the last 168 hours. No results for input(s): AMMONIA in the last 168 hours. CBC: Recent Labs  Lab 08/11/19 1219  08/11/19 1813 08/12/19 0015 08/14/19 0352 08/15/19 0224  WBC 9.0  --  18.3* 14.7* 15.1* 13.3*  NEUTROABS 7.3  --   --   --   --   --   HGB 7.5*   < > 10.6* 9.9* 7.8* 7.8*  HCT 22.1*   < > 29.4* 27.7* 21.6* 21.8*  MCV 85.3  --  82.8 83.7 83.7 82.9  PLT 55*  --  72* 68* 60* 60*   < > = values in this interval not displayed.   Cardiac Enzymes: No results for input(s): CKTOTAL, CKMB, CKMBINDEX, TROPONINI in the last 168 hours. CBG: Recent Labs  Lab  08/14/19 1130 08/14/19 1635 08/14/19 2051 08/15/19 0640 08/15/19 1122  GLUCAP 182* 176* 176* 164* 159*    Iron Studies: No results for input(s): IRON, TIBC, TRANSFERRIN, FERRITIN in the last 72 hours. Studies/Results: US Renal  Result Date: 08/13/2019 CLINICAL DATA:  53 year old male with acute renal insufficiency. EXAM: RENAL / URINARY TRACT ULTRASOUND COMPLETE COMPARISON:  Right upper quadrant ultrasound dated 06/17/2019 and CT of the abdomen pelvis dated 12/12/2016 FINDINGS: Right Kidney: Renal measurements: 10.6 x 5.4 x 6.0 cm = volume: 178 mL. Normal echogenicity. No hydronephrosis or shadowing stone. Left Kidney: Renal measurements: 12.2 x 4.7 x 5.2 cm = volume: 154 mL. Normal echogenicity. No hydronephrosis or shadowing stone. Bladder: The urinary bladder is decompressed around a Foley catheter. Partially visualized bilateral pleural effusions. IMPRESSION: 1. Unremarkable renal ultrasound. 2. Partially visualized bilateral pleural effusions. Electronically Signed   By: Anner Crete M.D.   On: 08/13/2019 16:02   .  sodium chloride   Intravenous Once  . aspirin EC  81 mg Oral Q0600  . atorvastatin  80 mg Oral Daily  . Chlorhexidine Gluconate Cloth  6 each Topical Daily  . chlorthalidone  50 mg Oral Daily  . clopidogrel  75 mg Oral Daily  . docusate sodium  100 mg Oral Daily  . gabapentin  300 mg Oral TID  . glipiZIDE  5 mg Oral BID AC  . heparin  5,000 Units Subcutaneous Q8H  . insulin aspart  0-9 Units Subcutaneous TID WC  . insulin glargine  20 Units Subcutaneous QPM  . labetalol  300 mg Oral BID  . mupirocin ointment  1 application Nasal BID  . NIFEdipine  90 mg Oral Daily  . pantoprazole  40 mg Oral Daily    BMET    Component Value Date/Time   NA 123 (L) 08/15/2019 0224   K 3.5 08/15/2019 0224   CL 92 (L) 08/15/2019 0224   CO2 21 (L) 08/15/2019 0224   GLUCOSE 166 (H) 08/15/2019 0224   BUN 59 (H) 08/15/2019 0224   CREATININE 2.75 (H) 08/15/2019 0224   CALCIUM  7.7 (L) 08/15/2019 0224   GFRNONAA 25 (L) 08/15/2019 0224   GFRAA 29 (L) 08/15/2019 0224   CBC    Component Value Date/Time   WBC 13.3 (H) 08/15/2019 0224   RBC 2.63 (L) 08/15/2019 0224   HGB 7.8 (L) 08/15/2019 0224   HCT 21.8 (L) 08/15/2019 0224   PLT 60 (L) 08/15/2019 0224   MCV 82.9 08/15/2019 0224   MCH 29.7 08/15/2019 0224   MCHC 35.8 08/15/2019 0224   RDW 13.5 08/15/2019 0224   LYMPHSABS 1.2 08/11/2019 1219   MONOABS 0.3 08/11/2019 1219   EOSABS 0.1 08/11/2019 1219   BASOSABS 0.0 08/11/2019 1219    sessment/Plan: 1. AKI- likely multifactorial: Ischemic atn with ABLA and relative hypotension as well as contrast nephropathy and likely bladder outlet obstruction. Also need to rule out myeloma given profound AKI with small amount of iv contrast. 1. Renal US without obstruction and FeNa <1% consistent with pre-renal, will start IVF's and follow.  2. Improved UOP with replacing foley given BOO and large PVR 3. Cr has finally started to improve, however his volume status is worse.  IVF's stopped and will add lasix 40 mg daily and follow.  4. No indication for dialysis at this time. 2. ABLA- he was transfused 2 units PRBCs post-op. Continue to follow and transfuse prn 3. Hyponatremia- likely due to hypervolemia.  Will add lasix as above and follow.  Will also check urine studies. 4. HTN- elevated and may need to add coreg or increase nifedipine. 5. PAD- s/p left fem to post tibial artery bypass graft. Per Dr. Carlis Abbott. 6. Thrombocytopenia- chronic and worked up by Hematology without etiology, however will d/c heparin and check HIT panel 7. DM type 2- poorly controlled. Agree with holding invokana with AKI and follow. 8. Deconditioning- working with PT  Donetta Potts, MD [facility] [phone]

## 2019-08-15 NOTE — Progress Notes (Signed)
[facility] [place]  Subjective  - No complaints.  Now on 2 L [place].  Cr finally plateau and 3.09 to 2.75.   Objective 121/68 97 98.6 F (37 C) (Oral) (!) 29 92%  Intake/Output Summary (Last 24 hours) at 08/15/2019 0827 Last data filed at 08/15/2019 0500 Gross per 24 hour  Intake 1616.26 ml  Output 800 ml  Net 816.26 ml    Cardiac:  regular Lungs:  2 L [place], no distress Incisions:  Left groin clean; proximal thigh incision with serous drainage; distal incision c/d/i  Extremities:  Brisk doppler signal left PT Left foot motor and sensory intact  Laboratory Lab Results: Recent Labs    08/14/19 0352 08/15/19 0224  WBC 15.1* 13.3*  HGB 7.8* 7.8*  HCT 21.6* 21.8*  PLT 60* 60*   BMET Recent Labs    08/14/19 0352 08/15/19 0224  NA 125* 123*  K 3.4* 3.5  CL 92* 92*  CO2 21* 21*  GLUCOSE 173* 166*  BUN 53* 59*  CREATININE 3.09* 2.75*  CALCIUM 7.7* 7.7*    COAG Lab Results  Component Value Date   INR 1.2 08/07/2019   INR 1.3 (H) 05/27/2019   No results found for: PTT  Assessment/Planning:  Postop day 4 status post left common femoral to PT bypass with composite PTFE and GSV.  Continues to have a very brisk posterior tibial signal in the left foot.  Nephrology following for AKI creatinine improved to from 3.09 to 2.75 and appears to have peaked.  Gentle hydration yesterday with NS at 50 ml/hr.  K ok at 3.5.  Appreciate nephrology seeing him.  Multifactorial etiology likely but unclear.  Hemoglobin stable at 7.8 with acute blood loss anemia following surgery.  Has chronic thrombocytopenia that was worked up by hematology prior to surgery and previously had trial of steroids with no improvement.  Platelets are 60 which was his baseline from before.   No indication for transfusion, no active bleeding.  Urine output yesterday 800.  He has a foley catheter in for urinary retention post-op and will keep that with strict I&O.  Continue to work on  mobility.  Will defer to nephrology if they want to try diuresis given new O2 requirement, now on 2 L .   [name] 08/15/2019 8:27 AM --

## 2019-08-16 LAB — RENAL FUNCTION PANEL
Albumin: 2.1 g/dL — ABNORMAL LOW (ref 3.5–5.0)
Anion gap: 12 (ref 5–15)
BUN: 70 mg/dL — ABNORMAL HIGH (ref 6–20)
CO2: 20 mmol/L — ABNORMAL LOW (ref 22–32)
Calcium: 7.9 mg/dL — ABNORMAL LOW (ref 8.9–10.3)
Chloride: 92 mmol/L — ABNORMAL LOW (ref 98–111)
Creatinine, Ser: 2.55 mg/dL — ABNORMAL HIGH (ref 0.61–1.24)
GFR calc Af Amer: 32 mL/min — ABNORMAL LOW (ref >60)
GFR calc non Af Amer: 27 mL/min — ABNORMAL LOW (ref >60)
Glucose, Bld: 174 mg/dL — ABNORMAL HIGH (ref 70–99)
Phosphorus: 5.7 mg/dL — ABNORMAL HIGH (ref 2.5–4.6)
Potassium: 3.8 mmol/L (ref 3.5–5.1)
Sodium: 124 mmol/L — ABNORMAL LOW (ref 135–145)

## 2019-08-16 LAB — CBC
HCT: 22.8 % — ABNORMAL LOW (ref 39.0–52.0)
Hemoglobin: 8 g/dL — ABNORMAL LOW (ref 13.0–17.0)
MCH: 28.9 pg (ref 26.0–34.0)
MCHC: 35.1 g/dL (ref 30.0–36.0)
MCV: 82.3 fL (ref 80.0–100.0)
Platelets: 76 10*3/uL — ABNORMAL LOW (ref 150–400)
RBC: 2.77 MIL/uL — ABNORMAL LOW (ref 4.22–5.81)
RDW: 13.2 % (ref 11.5–15.5)
WBC: 13.7 10*3/uL — ABNORMAL HIGH (ref 4.0–10.5)
nRBC: 0 % (ref 0.0–0.2)

## 2019-08-16 LAB — GLUCOSE, CAPILLARY
Glucose-Capillary: 183 mg/dL — ABNORMAL HIGH (ref 70–99)
Glucose-Capillary: 161 mg/dL — ABNORMAL HIGH (ref 70–99)
Glucose-Capillary: 161 mg/dL — ABNORMAL HIGH (ref 70–99)
Glucose-Capillary: 204 mg/dL — ABNORMAL HIGH (ref 70–99)

## 2019-08-16 LAB — HEPARIN INDUCED PLATELET AB (HIT ANTIBODY): Heparin Induced Plt Ab: 0.12 OD (ref 0.000–0.400)

## 2019-08-16 MED ORDER — SODIUM CHLORIDE 0.9 % IV SOLN
2.0000 g | Freq: Two times a day (BID) | INTRAVENOUS | Status: DC
  Administered 2019-08-16 – 2019-08-17 (×3): 2 g via INTRAVENOUS
  Filled 2019-08-16 (×4): qty 2

## 2019-08-16 MED ORDER — FUROSEMIDE 10 MG/ML IJ SOLN
120.0000 mg | Freq: Three times a day (TID) | INTRAVENOUS | Status: DC
  Administered 2019-08-16 – 2019-08-17 (×2): 120 mg via INTRAVENOUS
  Filled 2019-08-16: qty 2
  Filled 2019-08-16 (×2): qty 12
  Filled 2019-08-16: qty 10

## 2019-08-16 MED ORDER — METOLAZONE 5 MG PO TABS
5.0000 mg | ORAL_TABLET | Freq: Every day | ORAL | Status: DC
  Administered 2019-08-16 – 2019-08-17 (×2): 5 mg via ORAL

## 2019-08-16 MED ORDER — FUROSEMIDE 10 MG/ML IJ SOLN
40.0000 mg | Freq: Every day | INTRAMUSCULAR | Status: DC
  Administered 2019-08-16: 40 mg via INTRAVENOUS
  Filled 2019-08-16: qty 4

## 2019-08-16 MED ORDER — CHLORHEXIDINE GLUCONATE CLOTH 2 % EX PADS
6.0000 | MEDICATED_PAD | Freq: Every day | CUTANEOUS | Status: DC
  Administered 2019-08-17 – 2019-09-10 (×23): 6 via TOPICAL

## 2019-08-16 MED ORDER — METOLAZONE 5 MG PO TABS
5.0000 mg | ORAL_TABLET | Freq: Every day | ORAL | Status: DC
  Administered 2019-08-16: 5 mg via ORAL
  Filled 2019-08-16 (×3): qty 1

## 2019-08-16 MED ORDER — FUROSEMIDE 10 MG/ML IJ SOLN
80.0000 mg | Freq: Two times a day (BID) | INTRAMUSCULAR | Status: DC
  Administered 2019-08-16 (×2): 80 mg via INTRAVENOUS
  Filled 2019-08-16 (×2): qty 8

## 2019-08-16 MED ORDER — PNEUMOCOCCAL VAC POLYVALENT 25 MCG/0.5ML IJ INJ: 0.5000 mL | INJECTION | INTRAMUSCULAR | Status: DC | PRN

## 2019-08-16 MED ORDER — INFLUENZA VAC SPLIT QUAD 0.5 ML IM SUSY
0.5000 mL | PREFILLED_SYRINGE | INTRAMUSCULAR | Status: AC | PRN
  Administered 2019-09-10: 0.5 mL via INTRAMUSCULAR
  Filled 2019-08-16: qty 0.5

## 2019-08-16 NOTE — Progress Notes (Signed)
Rehab Admissions Coordinator Note:  Patient was screened by Cleatrice Burke for appropriateness for an Inpatient Acute Rehab Consult per PT change in recommendation with patient decline in medical status and transferred to ICU. I await further assessment by therapy once medically improves to assist with determining if an inpt rehab admit will be needed prior to d/c home. I will follow.Cleatrice Burke RN MSN 08/16/2019, 10:55 AM  I can be reached at [phone].

## 2019-08-16 NOTE — Progress Notes (Signed)
Patient ID: Edward Patricia., male   DOB: March 31, 1966, 53 y.o.   MRN: [id number] S: Pt developed some respiratory distress and increased oxygen requirements last pm.  Given IV lasix and transferred to ICU for precaution and close observation.  He was given IV lasix with improvement of UOP. O:BP 110/67   Pulse 88   Temp 98.2 F (36.8 C) (Oral)   Resp 16   Ht 6\' 1"  (1.854 m)   Wt 104.1 kg   SpO2 92%   BMI 30.28 kg/m   Intake/Output Summary (Last 24 hours) at 08/16/2019 0823 Last data filed at 08/16/2019 0800 Gross per 24 hour  Intake 1355.23 ml  Output 1470 ml  Net -114.77 ml   Intake/Output: I/O last 3 completed shifts: In: 2981.5 [P.O.:1690; I.V.:1291.5] Out: 2270 [Urine:2270]  Intake/Output this shift:  Total I/O In: 110 [P.O.:110] Out: -  Weight change:  Gen: NAD CVS: no rub Resp: bilateral crackles anteriorly Abd: +BS, soft, NT Ext: 1+ edema  Recent Labs  Lab 08/11/19 1238 08/11/19 1813 08/12/19 0015 08/13/19 0455 08/14/19 0352 08/15/19 0224 08/16/19 0228  NA 132*  --  129* 125* 125* 123* 124*  K 4.0  --  4.3 4.2 3.4* 3.5 3.8  CL  --   --  95* 92* 92* 92* 92*  CO2  --   --  20* 21* 21* 21* 20*  GLUCOSE 197*  --  200* 152* 173* 166* 174*  BUN  --   --  24* 40* 53* 59* 70*  CREATININE  --  1.78* 1.97* 2.73* 3.09* 2.75* 2.55*  ALBUMIN  --   --   --   --  2.4* 2.2* 2.1*  CALCIUM  --   --  8.1* 7.6* 7.7* 7.7* 7.9*  PHOS  --   --   --   --  4.0 4.3 5.7*   Liver Function Tests: Recent Labs  Lab 08/14/19 0352 08/15/19 0224 08/16/19 0228  ALBUMIN 2.4* 2.2* 2.1*   No results for input(s): LIPASE, AMYLASE in the last 168 hours. No results for input(s): AMMONIA in the last 168 hours. CBC: Recent Labs  Lab 08/11/19 1219  08/11/19 1813 08/12/19 0015 08/14/19 0352 08/15/19 0224 08/16/19 0228  WBC 9.0  --  18.3* 14.7* 15.1* 13.3* 13.7*  NEUTROABS 7.3  --   --   --   --   --   --   HGB 7.5*   < > 10.6* 9.9* 7.8* 7.8* 8.0*  HCT 22.1*   < > 29.4* 27.7* 21.6*  21.8* 22.8*  MCV 85.3  --  82.8 83.7 83.7 82.9 82.3  PLT 55*  --  72* 68* 60* 60* 76*   < > = values in this interval not displayed.   Cardiac Enzymes: No results for input(s): CKTOTAL, CKMB, CKMBINDEX, TROPONINI in the last 168 hours. CBG: Recent Labs  Lab 08/15/19 1122 08/15/19 1607 08/15/19 1747 08/15/19 2143 08/16/19 0700  GLUCAP 159* 176* 216* 201* 183*    Iron Studies: No results for input(s): IRON, TIBC, TRANSFERRIN, FERRITIN in the last 72 hours. Studies/Results: Dg Chest Port 1 View  Result Date: 08/15/2019 CLINICAL DATA:  Shortness of breath. EXAM: PORTABLE CHEST 1 VIEW COMPARISON:  07/11/2019 FINDINGS: Cardiomediastinal contours remain enlarged. Accentuated by portable technique. Patchy opacities are present bilaterally with some subpleural sparing. The more dense airspace component seen at the right lung base with signs of interstitial prominence as well. No acute bone process. IMPRESSION: 1. Findings suspicious for multifocal pneumonia worse at  the right lung base. 2. Superimposed and/or asymmetric edema is also considered, correlate with BNP. Electronically Signed   By: Zetta Bills M.D.   On: 08/15/2019 16:33   . sodium chloride   Intravenous Once  . aspirin EC  81 mg Oral Q0600  . atorvastatin  80 mg Oral Daily  . Chlorhexidine Gluconate Cloth  6 each Topical Daily  . chlorthalidone  50 mg Oral Daily  . clopidogrel  75 mg Oral Daily  . docusate sodium  100 mg Oral Daily  . furosemide  40 mg Oral Daily  . gabapentin  300 mg Oral TID  . glipiZIDE  5 mg Oral BID AC  . insulin aspart  0-9 Units Subcutaneous TID WC  . insulin glargine  20 Units Subcutaneous QPM  . labetalol  300 mg Oral BID  . mupirocin ointment  1 application Nasal BID  . NIFEdipine  90 mg Oral Daily  . pantoprazole  40 mg Oral Daily  . sodium chloride flush  3 mL Intravenous Q12H    BMET    Component Value Date/Time   NA 124 (L) 08/16/2019 0228   K 3.8 08/16/2019 0228   CL 92 (L)  08/16/2019 0228   CO2 20 (L) 08/16/2019 0228   GLUCOSE 174 (H) 08/16/2019 0228   BUN 70 (H) 08/16/2019 0228   CREATININE 2.55 (H) 08/16/2019 0228   CALCIUM 7.9 (L) 08/16/2019 0228   GFRNONAA 27 (L) 08/16/2019 0228   GFRAA 32 (L) 08/16/2019 0228   CBC    Component Value Date/Time   WBC 13.7 (H) 08/16/2019 0228   RBC 2.77 (L) 08/16/2019 0228   HGB 8.0 (L) 08/16/2019 0228   HCT 22.8 (L) 08/16/2019 0228   PLT 76 (L) 08/16/2019 0228   MCV 82.3 08/16/2019 0228   MCH 28.9 08/16/2019 0228   MCHC 35.1 08/16/2019 0228   RDW 13.2 08/16/2019 0228   LYMPHSABS 1.2 08/11/2019 1219   MONOABS 0.3 08/11/2019 1219   EOSABS 0.1 08/11/2019 1219   BASOSABS 0.0 08/11/2019 1219     Asessment/Plan: 1. AKI- likely multifactorial: Ischemic atn with ABLA and relative hypotension as well as contrast nephropathy and likely bladder outlet obstruction. Also need to rule out myeloma given profound AKI with small amount of iv contrast. 1. Renal US without obstructionand FeNa <1% consistent with pre-renal, will start IVF's and follow. 2. Improved UOP withreplacing foley given BOO and large PVR 3. Cr has finally started to improve, however his volume status is worse.  IVF's stopped and will add lasix 40 mg daily and follow. 4. No indication for dialysis at this time. 2. Respiratory distress and hypoxemia- pt with atypical infiltrates on CXR.  Still has crackles but only anteriorly.  Would recheck CXR and if clearing up this is likely due to volume overload, however if it persists, we may need to treat for Pneumonia.  Breathing better. 3. ABLA- he was transfused 2 units PRBCs post-op. Continue to follow and transfuse prn 4. Hyponatremia- likely due to hypervolemia.  Will add lasix as above and follow.  Will also check urine studies. 5. HTN- elevated and may need to add coreg or increase nifedipine. 6. PAD- s/p left fem to post tibial artery bypass graft. Per Dr. Carlis Abbott. 7. Thrombocytopenia- chronic and  worked up by Hematology without etiology, however will d/c heparin and check HIT panel 8. DM type 2- poorly controlled. Agree with holding invokana with AKI and follow. 9. Deconditioning- working with PT  Donetta Potts, MD [facility] [phone]

## 2019-08-16 NOTE — Progress Notes (Signed)
    Subjective  - POD #5, s/p left fem PT with composite  Transferred to ICU for SOB Breathing a little better this  Eating breakfast   Physical Exam:  Doppler Left PT Respirations slightly labored with ronchi  CXR: 1. Findings suspicious for multifocal pneumonia worse at the right lung base. 2. Superimposed and/or asymmetric edema is also considered, correlate with BNP    Assessment/Plan:  POD #5  ARF:  Creatinine trending down.  Off IV fluids, continue to monitor UOP with foley in place SOB:  ? Pneumonia on CXR.  Will start abx since minimal improvement with diruesis and increased WBC HIT panel pending, off SQ heparin, place SCD's for DVT prophylaxis  Edward Rocha 08/16/2019 8:20 AM --  Vitals:   08/16/19 0700 08/16/19 0800  BP: 112/63 110/67  Pulse: 84 88  Resp: 16 16  Temp: 98.2 F (36.8 C)   SpO2: 95% 92%    Intake/Output Summary (Last 24 hours) at 08/16/2019 0820 Last data filed at 08/16/2019 0800 Gross per 24 hour  Intake 1355.23 ml  Output 1470 ml  Net -114.77 ml     Laboratory CBC    Component Value Date/Time   WBC 13.7 (H) 08/16/2019 0228   HGB 8.0 (L) 08/16/2019 0228   HCT 22.8 (L) 08/16/2019 0228   PLT 76 (L) 08/16/2019 0228    BMET    Component Value Date/Time   NA 124 (L) 08/16/2019 0228   K 3.8 08/16/2019 0228   CL 92 (L) 08/16/2019 0228   CO2 20 (L) 08/16/2019 0228   GLUCOSE 174 (H) 08/16/2019 0228   BUN 70 (H) 08/16/2019 0228   CREATININE 2.55 (H) 08/16/2019 0228   CALCIUM 7.9 (L) 08/16/2019 0228   GFRNONAA 27 (L) 08/16/2019 0228   GFRAA 32 (L) 08/16/2019 0228    COAG Lab Results  Component Value Date   INR 1.2 08/07/2019   INR 1.3 (H) 05/27/2019   No results found for: PTT  Antibiotics Anti-infectives (From admission, onward)   Start     Dose/Rate Route Frequency Ordered Stop   08/11/19 2000  ceFAZolin (ANCEF) IVPB 2g/100 mL premix     2 g 200 mL/hr over 30 Minutes Intravenous Every 8 hours 08/11/19 1750  08/12/19 0335   08/11/19 0550  ceFAZolin (ANCEF) IVPB 2g/100 mL premix     2 g 200 mL/hr over 30 Minutes Intravenous 30 min pre-op 08/11/19 0550 08/11/19 1136       V. Leia Alf, M.D., [facility] [place] Office: [phone] Pager:  [phone]

## 2019-08-16 NOTE — Progress Notes (Signed)
Pharmacy Antibiotic Note  Edward Rocha. is a 53 y.o. male admitted on 08/11/2019 s/p L femoral bypass.  10/16 increased SOB,  Cxr multifocal pna, WBC 13.7, afebrile Cr increased 2.5.   Pharmacy has been consulted for Cefepime dosing.  Plan: Cefepime 2gm IV q12h Monitor renal function for any needed dose adjustments Monitor s/s infection and needed changes to therapy  Height: 6\' 1"  (185.4 cm) Weight: 229 lb 8 oz (104.1 kg) IBW/kg (Calculated) : 79.9  Temp (24hrs), Avg:98.5 F (36.9 C), Min:97.9 F (36.6 C), Max:99.2 F (37.3 C)  Recent Labs  Lab 08/11/19 1813 08/12/19 0015 08/13/19 0455 08/14/19 0352 08/15/19 0224 08/16/19 0228  WBC 18.3* 14.7*  --  15.1* 13.3* 13.7*  CREATININE 1.78* 1.97* 2.73* 3.09* 2.75* 2.55*    Estimated Creatinine Clearance: 41.5 mL/min (A) (by C-G formula based on SCr of 2.55 mg/dL (H)).    Allergies  Allergen Reactions  . Heparin     HIT antibody ordered 10/16  . Other     Pt received platelets and had a bad reaction from infusion     Antimicrobials this admission:  Dose adjustments this admission:   Microbiology results:   [name] Pharm.D. CPP, BCPS Clinical Pharmacist [phone] 08/16/2019 11:22 AM

## 2019-08-17 ENCOUNTER — Inpatient Hospital Stay (HOSPITAL_COMMUNITY): Payer: Managed Care, Other (non HMO)

## 2019-08-17 DIAGNOSIS — J9601 Acute respiratory failure with hypoxia: Secondary | ICD-10-CM

## 2019-08-17 LAB — CBC
HCT: 21.8 % — ABNORMAL LOW (ref 39.0–52.0)
Hemoglobin: 7.4 g/dL — ABNORMAL LOW (ref 13.0–17.0)
MCH: 28.1 pg (ref 26.0–34.0)
MCHC: 33.9 g/dL (ref 30.0–36.0)
MCV: 82.9 fL (ref 80.0–100.0)
Platelets: 75 10*3/uL — ABNORMAL LOW (ref 150–400)
RBC: 2.63 MIL/uL — ABNORMAL LOW (ref 4.22–5.81)
RDW: 13.5 % (ref 11.5–15.5)
WBC: 12.7 10*3/uL — ABNORMAL HIGH (ref 4.0–10.5)
nRBC: 0 % (ref 0.0–0.2)

## 2019-08-17 LAB — POCT I-STAT 7, (LYTES, BLD GAS, ICA,H+H)
Acid-base deficit: 4 mmol/L — ABNORMAL HIGH (ref 0.0–2.0)
Bicarbonate: 20.2 mmol/L (ref 20.0–28.0)
Calcium, Ion: 1.1 mmol/L — ABNORMAL LOW (ref 1.15–1.40)
HCT: 26 % — ABNORMAL LOW (ref 39.0–52.0)
Hemoglobin: 8.8 g/dL — ABNORMAL LOW (ref 13.0–17.0)
O2 Saturation: 93 %
Patient temperature: 98.6
Potassium: 4.1 mmol/L (ref 3.5–5.1)
Sodium: 124 mmol/L — ABNORMAL LOW (ref 135–145)
TCO2: 21 mmol/L — ABNORMAL LOW (ref 22–32)
pCO2 arterial: 32.2 mmHg (ref 32.0–48.0)
pH, Arterial: 7.407 (ref 7.350–7.450)
pO2, Arterial: 66 mmHg — ABNORMAL LOW (ref 83.0–108.0)

## 2019-08-17 LAB — RENAL FUNCTION PANEL
Albumin: 2.1 g/dL — ABNORMAL LOW (ref 3.5–5.0)
Anion gap: 14 (ref 5–15)
BUN: 91 mg/dL — ABNORMAL HIGH (ref 6–20)
CO2: 21 mmol/L — ABNORMAL LOW (ref 22–32)
Calcium: 7.9 mg/dL — ABNORMAL LOW (ref 8.9–10.3)
Chloride: 89 mmol/L — ABNORMAL LOW (ref 98–111)
Creatinine, Ser: 3.45 mg/dL — ABNORMAL HIGH (ref 0.61–1.24)
GFR calc Af Amer: 22 mL/min — ABNORMAL LOW (ref >60)
GFR calc non Af Amer: 19 mL/min — ABNORMAL LOW (ref >60)
Glucose, Bld: 189 mg/dL — ABNORMAL HIGH (ref 70–99)
Phosphorus: 7.9 mg/dL — ABNORMAL HIGH (ref 2.5–4.6)
Potassium: 4.3 mmol/L (ref 3.5–5.1)
Sodium: 124 mmol/L — ABNORMAL LOW (ref 135–145)

## 2019-08-17 LAB — BASIC METABOLIC PANEL
Anion gap: 17 — ABNORMAL HIGH (ref 5–15)
BUN: 99 mg/dL — ABNORMAL HIGH (ref 6–20)
CO2: 19 mmol/L — ABNORMAL LOW (ref 22–32)
Calcium: 8.2 mg/dL — ABNORMAL LOW (ref 8.9–10.3)
Chloride: 88 mmol/L — ABNORMAL LOW (ref 98–111)
Creatinine, Ser: 4.05 mg/dL — ABNORMAL HIGH (ref 0.61–1.24)
GFR calc Af Amer: 18 mL/min — ABNORMAL LOW (ref >60)
GFR calc non Af Amer: 16 mL/min — ABNORMAL LOW (ref >60)
Glucose, Bld: 150 mg/dL — ABNORMAL HIGH (ref 70–99)
Potassium: 3.8 mmol/L (ref 3.5–5.1)
Sodium: 124 mmol/L — ABNORMAL LOW (ref 135–145)

## 2019-08-17 LAB — GLUCOSE, CAPILLARY
Glucose-Capillary: 172 mg/dL — ABNORMAL HIGH (ref 70–99)
Glucose-Capillary: 145 mg/dL — ABNORMAL HIGH (ref 70–99)
Glucose-Capillary: 149 mg/dL — ABNORMAL HIGH (ref 70–99)
Glucose-Capillary: 161 mg/dL — ABNORMAL HIGH (ref 70–99)

## 2019-08-17 LAB — HEMOGLOBIN AND HEMATOCRIT, BLOOD
HCT: 25.5 % — ABNORMAL LOW (ref 39.0–52.0)
Hemoglobin: 8.8 g/dL — ABNORMAL LOW (ref 13.0–17.0)

## 2019-08-17 LAB — PREPARE RBC (CROSSMATCH)

## 2019-08-17 MED ORDER — SODIUM CHLORIDE 0.9 % IV SOLN: 2.0000 g | INTRAVENOUS | Status: DC

## 2019-08-17 MED ORDER — HEPARIN SODIUM (PORCINE) 1000 UNIT/ML DIALYSIS
1000.0000 [IU] | INTRAMUSCULAR | Status: DC | PRN
  Administered 2019-08-20: 2400 [IU] via INTRAVENOUS_CENTRAL
  Administered 2019-08-23: 1000 [IU] via INTRAVENOUS_CENTRAL
  Administered 2019-08-25: 2800 [IU] via INTRAVENOUS_CENTRAL
  Administered 2019-08-28 – 2019-08-30 (×2): 1000 [IU] via INTRAVENOUS_CENTRAL
  Filled 2019-08-17: qty 6
  Filled 2019-08-17: qty 3
  Filled 2019-08-17 (×5): qty 6

## 2019-08-17 MED ORDER — PRISMASOL BGK 4/2.5 32-4-2.5 MEQ/L REPLACEMENT SOLN
Status: DC
  Administered 2019-08-18 – 2019-08-20 (×4): via INTRAVENOUS_CENTRAL

## 2019-08-17 MED ORDER — HEPARIN SODIUM (PORCINE) 5000 UNIT/ML IJ SOLN
5000.0000 [IU] | Freq: Three times a day (TID) | INTRAMUSCULAR | Status: DC
  Administered 2019-08-17 – 2019-08-22 (×15): 5000 [IU] via SUBCUTANEOUS
  Filled 2019-08-17 (×15): qty 1

## 2019-08-17 MED ORDER — SODIUM CHLORIDE 0.9 % FOR CRRT
INTRAVENOUS_CENTRAL | Status: DC | PRN
  Filled 2019-08-17: qty 1000

## 2019-08-17 MED ORDER — PRISMASOL BGK 4/2.5 32-4-2.5 MEQ/L REPLACEMENT SOLN
Status: DC
  Administered 2019-08-18 – 2019-08-20 (×4): via INTRAVENOUS_CENTRAL

## 2019-08-17 MED ORDER — ALTEPLASE 2 MG IJ SOLR: 2.0000 mg | Freq: Once | INTRAMUSCULAR | Status: DC | PRN

## 2019-08-17 MED ORDER — FUROSEMIDE 10 MG/ML IJ SOLN
160.0000 mg | Freq: Four times a day (QID) | INTRAVENOUS | Status: DC
  Administered 2019-08-17 (×2): 160 mg via INTRAVENOUS
  Filled 2019-08-17: qty 16
  Filled 2019-08-17: qty 10
  Filled 2019-08-17: qty 16
  Filled 2019-08-17: qty 10

## 2019-08-17 MED ORDER — SODIUM CHLORIDE 0.9 % IV SOLN
2.0000 g | Freq: Two times a day (BID) | INTRAVENOUS | Status: DC
  Administered 2019-08-18 – 2019-08-20 (×5): 2 g via INTRAVENOUS
  Filled 2019-08-17 (×6): qty 2

## 2019-08-17 MED ORDER — PRISMASOL BGK 4/2.5 32-4-2.5 MEQ/L IV SOLN
INTRAVENOUS | Status: DC
  Administered 2019-08-18 – 2019-08-20 (×19): via INTRAVENOUS_CENTRAL

## 2019-08-17 MED ORDER — SODIUM CHLORIDE 0.9% IV SOLUTION
Freq: Once | INTRAVENOUS | Status: AC
  Administered 2019-08-17: 10:00:00 via INTRAVENOUS

## 2019-08-17 NOTE — Consult Note (Signed)
NAME:  Edward Balagot., MRN:  [id number], DOB:  05-08-1966, LOS: 6 ADMISSION DATE:  08/11/2019, CONSULTATION DATE:  08/17/19, CHIEF COMPLAINT:  SOB  Brief History   This is a 53 yo with history of DM, HTN, HLD, thrombocytopenia, and PAD s/p aortogram with left iliac arteriogram prior to left femoral to PTA bypass graft placement on 08/11/19. Has had a complication of oliguric renal failure  History of present illness   This is a 53 yo with history of DM, HTN, HLD, thrombocytopenia, and PAD s/p aortogram with left iliac arteriogram prior to left femoral to PTA bypass graft placement on 08/11/19. Has had a complication of oliguric renal failure progressing to anuric renal failure with overload. PCCM was consulted on eve of 10/18 for increased WOB, hypoxia and confusion. Patient has had 2 doses of 160 mg of lasix and metolazone throughout the day today. Despite this his UOP has only been 150 ml over 10 hours. Patient becoming more confused. Night before the patient could feed himself currenlty so tired and sleepy that he is not interested in eating. Has been mildly hypothermic.   Past Medical History  DM HTN HLD Thrombocytopenia PAD AKI  Significant Hospital Events   PTA on 08/11/19  Consults:  PCCM Nephro Surgery  Procedures:  As above  Significant Diagnostic Tests:  Recent renal ultrasound showing   Micro Data:  Covid negative.  Antimicrobials:  On Cefepime for 2 days for possible pna  Interim history/subjective:  Progressively worsening renal function. Now volume overloaded.   Objective   Blood pressure (!) 116/59, pulse 75, temperature (!) 94.1 F (34.5 C), temperature source Axillary, resp. rate 14, height 6\' 1"  (1.854 m), weight 104 kg, SpO2 93 %.        Intake/Output Summary (Last 24 hours) at 08/17/2019 2150 Last data filed at 08/17/2019 2000 Gross per 24 hour  Intake 1030.97 ml  Output 208 ml  Net 822.97 ml   Filed Weights   08/11/19 0555 08/15/19 1710  08/17/19 0600  Weight: 98.4 kg 104.1 kg 104 kg    Examination: General: Patient AOx3 but appears sleepy HENT: Moist mucous membranes Lungs: Crackles diffusely Cardiovascular: Regualr rate. 2+ pitting edema in lower extremities Abdomen: Distended but not tnese Extremities: With swelling. Able to move to commands Neuro: No focal deficits GU: Foley in place.   Resolved Hospital Problem list   NA   Assessment & Plan:  This is a 53 yo with history as noted above for who PCCM consulted for increased WOB, Hypoxia and confusion in the setting of AKI. Has received multiple doses of lasix 160 and metolazone without response.    AKI with volume overload causing hypoxia increased WOB and AMS -Have placed central line for CRRT. -Continue to avoid nephrotoxins -CPAP for increased WOB and hypoxia -As patient hypothermic as well have put in for blood cultures  Hyponatremia-Has been present for day -Likely secondary to volume overload -Await and see if vvolume removal improves this  Hypoxic respiratory failure-2/2 to volume overload and possible PNA -Remove fluid with CRRT      Best practice:  Diet: NPO for now Pain/Anxiety/Delirium protocol (if indicated): per primary VAP protocol (if indicated): NA DVT prophylaxis: Heparin GI prophylaxis: NA Mobility: As toelrated Code Status: Full for now Family Communication: Have spoken with wife to attain consent for vas cath Disposition: ICU for now   Labs   CBC: Recent Labs  Lab 08/11/19 1219  08/12/19 0015 08/14/19 0352 08/15/19 0224 08/16/19 0228 08/17/19  [id number] 08/17/19 1842 08/17/19 2058  WBC 9.0   < > 14.7* 15.1* 13.3* 13.7* 12.7*  --   --   NEUTROABS 7.3  --   --   --   --   --   --   --   --   HGB 7.5*   < > 9.9* 7.8* 7.8* 8.0* 7.4* 8.8* 8.8*  HCT 22.1*   < > 27.7* 21.6* 21.8* 22.8* 21.8* 25.5* 26.0*  MCV 85.3   < > 83.7 83.7 82.9 82.3 82.9  --   --   PLT 55*   < > 68* 60* 60* 76* 75*  --   --    < > = values in this  interval not displayed.    Basic Metabolic Panel: Recent Labs  Lab 08/14/19 0352 08/15/19 0224 08/16/19 0228 08/17/19 0224 08/17/19 1227 08/17/19 2058  NA 125* 123* 124* 124* 124* 124*  K 3.4* 3.5 3.8 4.3 3.8 4.1  CL 92* 92* 92* 89* 88*  --   CO2 21* 21* 20* 21* 19*  --   GLUCOSE 173* 166* 174* 189* 150*  --   BUN 53* 59* 70* 91* 99*  --   CREATININE 3.09* 2.75* 2.55* 3.45* 4.05*  --   CALCIUM 7.7* 7.7* 7.9* 7.9* 8.2*  --   PHOS 4.0 4.3 5.7* 7.9*  --   --    GFR: Estimated Creatinine Clearance: 26.1 mL/min (A) (by C-G formula based on SCr of 4.05 mg/dL (H)). Recent Labs  Lab 08/14/19 0352 08/15/19 0224 08/16/19 0228 08/17/19 0224  WBC 15.1* 13.3* 13.7* 12.7*    Liver Function Tests: Recent Labs  Lab 08/14/19 0352 08/15/19 0224 08/16/19 0228 08/17/19 0224  ALBUMIN 2.4* 2.2* 2.1* 2.1*   No results for input(s): LIPASE, AMYLASE in the last 168 hours. No results for input(s): AMMONIA in the last 168 hours.  ABG    Component Value Date/Time   PHART 7.407 08/17/2019 2058   PCO2ART 32.2 08/17/2019 2058   PO2ART 66.0 (L) 08/17/2019 2058   HCO3 20.2 08/17/2019 2058   TCO2 21 (L) 08/17/2019 2058   ACIDBASEDEF 4.0 (H) 08/17/2019 2058   O2SAT 93.0 08/17/2019 2058     Coagulation Profile: No results for input(s): INR, PROTIME in the last 168 hours.  Cardiac Enzymes: No results for input(s): CKTOTAL, CKMB, CKMBINDEX, TROPONINI in the last 168 hours.  HbA1C: Hgb A1c MFr Bld  Date/Time Value Ref Range Status  08/11/2019 12:19 PM 8.5 (H) 4.8 - 5.6 % Final    Comment:    (NOTE) Pre diabetes:          5.7%-6.4% Diabetes:              >6.4% Glycemic control for   <7.0% adults with diabetes   07/09/2019 03:18 PM 9.9 (H) 4.8 - 5.6 % Final    Comment:    (NOTE)         Prediabetes: 5.7 - 6.4         Diabetes: >6.4         Glycemic control for adults with diabetes: <7.0     CBG: Recent Labs  Lab 08/16/19 2122 08/17/19 0643 08/17/19 1120 08/17/19 1628  08/17/19 2055  GLUCAP 204* 172* 145* 149* 161*    Review of Systems:   Pertinent positive and negatives per HPI  Past Medical History  He,  has a past medical history of Acute renal failure (ARF) ([place]), Bilateral pain of leg and foot, Diabetes mellitus without complication ([place]),  Dizziness, Elevated LFTs (05/2019), Frequent urination, Hyperglycemia, Hyperlipidemia, Hypernatremia, Hypertension, Peripheral vascular disease ([place]), Syncope, Thrombocytopenia ([place]), Thyroid disease, and Weakness.   Surgical History    Past Surgical History:  Procedure Laterality Date  . ABDOMINAL AORTOGRAM W/LOWER EXTREMITY N/A 02/06/2019   Procedure: ABDOMINAL AORTOGRAM W/LOWER EXTREMITY;  Surgeon: [name], MD;  Location: [facility];  Service: Cardiovascular;  Laterality: N/A;  bilateral  . ABDOMINAL AORTOGRAM W/LOWER EXTREMITY N/A 04/30/2019   Procedure: ABDOMINAL AORTOGRAM W/LOWER EXTREMITY;  Surgeon: [name], MD;  Location: [facility];  Service: Cardiovascular;  Laterality: N/A;  . AORTOGRAM  08/11/2019   Procedure: Aortogram;  Surgeon: [name], MD;  Location: [facility];  Service: Vascular;;  . FEMORAL-POPLITEAL BYPASS GRAFT Left 08/11/2019   Procedure: BYPASS LEFT FEMORAL-DISTAL POPLITEAL ARTERY USING PROPATEN GRAFT;  Surgeon: [name], MD;  Location: [facility];  Service: Vascular;  Laterality: Left;  Marland Kitchen MULTIPLE TOOTH EXTRACTIONS    . PERIPHERAL VASCULAR INTERVENTION  02/06/2019   Procedure: PERIPHERAL VASCULAR INTERVENTION;  Surgeon: [name], MD;  Location: [facility];  Service: Cardiovascular;;  Bilateral Iliacs  . PERIPHERAL VASCULAR INTERVENTION  04/30/2019   Procedure: PERIPHERAL VASCULAR INTERVENTION;  Surgeon: [name], MD;  Location: [facility];  Service: Cardiovascular;;  Stent - Lt. Iliac   . VASCULAR SURGERY       Social History   reports that he quit smoking about 6 months ago. His smoking use  included cigarettes. He has a 36.00 pack-year smoking history. He has never used smokeless tobacco. He reports previous alcohol use. He reports that he does not use drugs.   Family History   His family history includes Alcohol abuse in his brother and father; Bipolar disorder in his daughter and son; Cancer in his mother.   Allergies Allergies  Allergen Reactions  . Other     Pt received platelets and had a bad reaction from infusion      Home Medications  Prior to Admission medications   Medication Sig Start Date End Date Taking? Authorizing Provider  Accu-Chek FastClix Lancets MISC TEST ONC EDAILY 05/13/19  Yes [provider]  atorvastatin (LIPITOR) 80 MG tablet Take 80 mg by mouth daily.  04/16/19  Yes [provider]  chlorthalidone (HYGROTON) 50 MG tablet Take 50 mg by mouth daily.   Yes [provider]  clopidogrel (PLAVIX) 75 MG tablet Take 75 mg by mouth daily.   Yes [provider]  diphenhydrAMINE (BENADRYL) 25 MG tablet Take 25 mg by mouth daily as needed for itching.   Yes [provider]  empagliflozin (JARDIANCE) 10 MG TABS tablet Take 10 mg by mouth daily.   Yes [provider]  gabapentin (NEURONTIN) 300 MG capsule Take 300 mg by mouth 3 (three) times daily.   Yes [provider]  glipiZIDE (GLUCOTROL) 5 MG tablet Take 5 mg by mouth 2 (two) times daily before a meal.    Yes [provider]  Insulin Glargine (BASAGLAR KWIKPEN) 100 UNIT/ML SOPN Inject 20 Units into the skin every evening.   Yes [provider]  labetalol (NORMODYNE) 300 MG tablet Take 300 mg by mouth 2 (two) times daily.   Yes [provider]  NIFEdipine (PROCARDIA XL/NIFEDICAL-XL) 90 MG 24 hr tablet Take 90 mg by mouth daily.  06/18/19  Yes [provider]  acetaminophen (TYLENOL) 500 MG tablet Take 1,000 mg by mouth daily as needed for moderate pain or  headache.    [provider]     Critical care  time: 45 minutes

## 2019-08-17 NOTE — Procedures (Signed)
Central Venous Catheter Insertion Procedure Note Brently Stawski [id number] 20-Jul-1966  Procedure: Insertion of Central Venous Catheter Indications: CRRT  Procedure Details Consent: Risks of procedure as well as the alternatives and risks of each were explained to the (patient/caregiver).  Consent for procedure obtained. Time Out: Verified patient identification, verified procedure, site/side was marked, verified correct patient position, special equipment/implants available, medications/allergies/relevent history reviewed, required imaging and test results available.  Performed  Maximum sterile technique was used including antiseptics, cap, gloves, gown, hand hygiene, mask and sheet. Skin prep: Chlorhexidine; local anesthetic administered A antimicrobial bonded/coated triple lumen catheter was placed in the right internal jugular vein using the Seldinger technique.  Evaluation Blood flow Brisk indicating volume overload Complications: No apparent complications Patient did tolerate procedure well. Chest X-Karim ordered to verify placement.  CXR: Noted line over atrial shadow. Retracted by 3 cm to 16 cm as line previously at 19cm.  Tyna Jaksch 08/17/2019, 11:34 PM

## 2019-08-17 NOTE — Plan of Care (Signed)
Unable to flush or receive blood return on the pt's current peripheral IV in the Left Hand. Applied tourniquet to assess for new potential IV site. Unable to distinguish between possible veins and edematous tissue. IV team consulted for vascular assessment/IV placement. Will continue to monitor and assess.

## 2019-08-17 NOTE — Progress Notes (Signed)
Called about need for RRT for worsening resp distress and renal failure.  Will plan CRRT. UF 100- 200/ hr as tol, no heparin.  Have dc'd all BP lowering meds for now w/ soft BP's.    Kelly Splinter, MD 08/17/2019, 10:16 PM

## 2019-08-17 NOTE — Plan of Care (Signed)
Communicated concerns of blood transfusion with nephrology in regards to fluid volume status, medication, and low UO in the last 24hr period. Per Nephrology, give one unit of PRBC's, monitor response, and let the blood run slowly. Updated orders given for lasix, now 160mg  q6h. Will proceed with transfusion per order, will follow nephrology recommendation to accommodate the pt's condition.

## 2019-08-17 NOTE — Progress Notes (Signed)
    Subjective  - POD #6, s/p composite fem-PT BPG  Feels worse today, very weak and dizzy Does not have the strength to walk   Physical Exam:  + pedal doppler signals Incisions ok  CXR: 1. Persistent extensive bilateral airspace opacities, slightly improved as compared to previous exam. 2. Suspected degree of underlying pulmonary interstitial congestion, also improved. 3. No other new cardiopulmonary abnormality.    Assessment/Plan:  POD #6 ARF:  Appreciate renal assistance.  Creatinine worse today.  Adjust meds as appropriate.  On Lasix and Zaroxolyn.  UOP trending down Pulm:  CXR slightly improved, continue abx for possible pneumonia ID Day 2 abx Heme:   Hb, trending down, given clinical  Picture I will give him 2 units prbc Prophylaxis:  SCD's for DVT.  Awaiting HIT panel results.  Continue protonix    Wells Nayshawn Mesta 08/17/2019 8:36 AM --  Vitals:   08/17/19 0800 08/17/19 0811  BP:  114/70  Pulse: 80 79  Resp: 11 10  Temp:    SpO2: 98% 98%    Intake/Output Summary (Last 24 hours) at 08/17/2019 0836 Last data filed at 08/17/2019 0800 Gross per 24 hour  Intake 382.55 ml  Output 288 ml  Net 94.55 ml     Laboratory CBC    Component Value Date/Time   WBC 12.7 (H) 08/17/2019 0224   HGB 7.4 (L) 08/17/2019 0224   HCT 21.8 (L) 08/17/2019 0224   PLT 75 (L) 08/17/2019 0224    BMET    Component Value Date/Time   NA 124 (L) 08/17/2019 0224   K 4.3 08/17/2019 0224   CL 89 (L) 08/17/2019 0224   CO2 21 (L) 08/17/2019 0224   GLUCOSE 189 (H) 08/17/2019 0224   BUN 91 (H) 08/17/2019 0224   CREATININE 3.45 (H) 08/17/2019 0224   CALCIUM 7.9 (L) 08/17/2019 0224   GFRNONAA 19 (L) 08/17/2019 0224   GFRAA 22 (L) 08/17/2019 0224    COAG Lab Results  Component Value Date   INR 1.2 08/07/2019   INR 1.3 (H) 05/27/2019   No results found for: PTT  Antibiotics Anti-infectives (From admission, onward)   Start     Dose/Rate Route Frequency Ordered Stop   08/16/19 1115  ceFEPIme (MAXIPIME) 2 g in sodium chloride 0.9 % 100 mL IVPB     2 g 200 mL/hr over 30 Minutes Intravenous Every 12 hours 08/16/19 1114     08/11/19 2000  ceFAZolin (ANCEF) IVPB 2g/100 mL premix     2 g 200 mL/hr over 30 Minutes Intravenous Every 8 hours 08/11/19 1750 08/12/19 0335   08/11/19 0550  ceFAZolin (ANCEF) IVPB 2g/100 mL premix     2 g 200 mL/hr over 30 Minutes Intravenous 30 min pre-op 08/11/19 0550 08/11/19 1136       V. Leia Alf, M.D., [facility] [place] Office: [phone] Pager:  [phone]

## 2019-08-17 NOTE — Progress Notes (Addendum)
Pharmacy Antibiotic Note  Edward Rocha. is a 53 y.o. male admitted on 08/11/2019 s/p L femoral bypass.  10/16 increased SOB,  Cxr multifocal pna, WBC 13.7, afebrile Cr increased 2.5.   Pharmacy has been consulted for Cefepime dosing.  Cr continues to rise, will adjust cefepime.  Plan: Reduce cefepime to 2g IV q24h Follow Cr trend  ADDENDUM: CRRT starting, will adjust cefepime dosing to 2g IV q12h  Height: 6\' 1"  (185.4 cm) Weight: 229 lb 4.5 oz (104 kg) IBW/kg (Calculated) : 79.9  Temp (24hrs), Avg:97 F (36.1 C), Min:95.7 F (35.4 C), Max:97.8 F (36.6 C)  Recent Labs  Lab 08/12/19 0015  08/14/19 0352 08/15/19 0224 08/16/19 0228 08/17/19 0224 08/17/19 1227  WBC 14.7*  --  15.1* 13.3* 13.7* 12.7*  --   CREATININE 1.97*   < > 3.09* 2.75* 2.55* 3.45* 4.05*   < > = values in this interval not displayed.    Estimated Creatinine Clearance: 26.1 mL/min (A) (by C-G formula based on SCr of 4.05 mg/dL (H)).    Allergies  Allergen Reactions  . Other     Pt received platelets and had a bad reaction from infusion     Antimicrobials this admission:  Dose adjustments this admission:   Microbiology results:   Arrie Senate, PharmD, BCPS Clinical Pharmacist [phone] Please check AMION for all [facility] numbers 08/17/2019

## 2019-08-17 NOTE — Progress Notes (Signed)
Patient ID: Edward Patricia., male   DOB: 11-02-1965, 53 y.o.   MRN: [id number] S: Has remained oliguric overnight despite escalating doses of lasix and metolazone.  No n/v +DOE O:BP 116/68   Pulse 78   Temp (!) 97.4 F (36.3 C) (Oral)   Resp 10   Ht 6\' 1"  (1.854 m)   Wt 104 kg   SpO2 97%   BMI 30.25 kg/m   Intake/Output Summary (Last 24 hours) at 08/17/2019 0944 Last data filed at 08/17/2019 0800 Gross per 24 hour  Intake 382.55 ml  Output 268 ml  Net 114.55 ml   Intake/Output: I/O last 3 completed shifts: In: 1292.5 [P.O.:880; I.V.:123.2; IV Piggyback:289.3] Out: 1155 [Urine:1155]  Intake/Output this shift:  Total I/O In: -  Out: 23 [Urine:23] Weight change: -0.1 kg Gen: ill-appearing  CVS: no rub Resp: bilateral rales Abd: +BS, soft, NT Ext: 2+ edema L>R  Recent Labs  Lab 08/11/19 1238 08/11/19 1813 08/12/19 0015 08/13/19 0455 08/14/19 0352 08/15/19 0224 08/16/19 0228 08/17/19 0224  NA 132*  --  129* 125* 125* 123* 124* 124*  K 4.0  --  4.3 4.2 3.4* 3.5 3.8 4.3  CL  --   --  95* 92* 92* 92* 92* 89*  CO2  --   --  20* 21* 21* 21* 20* 21*  GLUCOSE 197*  --  200* 152* 173* 166* 174* 189*  BUN  --   --  24* 40* 53* 59* 70* 91*  CREATININE  --  1.78* 1.97* 2.73* 3.09* 2.75* 2.55* 3.45*  ALBUMIN  --   --   --   --  2.4* 2.2* 2.1* 2.1*  CALCIUM  --   --  8.1* 7.6* 7.7* 7.7* 7.9* 7.9*  PHOS  --   --   --   --  4.0 4.3 5.7* 7.9*   Liver Function Tests: Recent Labs  Lab 08/15/19 0224 08/16/19 0228 08/17/19 0224  ALBUMIN 2.2* 2.1* 2.1*   No results for input(s): LIPASE, AMYLASE in the last 168 hours. No results for input(s): AMMONIA in the last 168 hours. CBC: Recent Labs  Lab 08/11/19 1219  08/12/19 0015 08/14/19 0352 08/15/19 0224 08/16/19 0228 08/17/19 0224  WBC 9.0   < > 14.7* 15.1* 13.3* 13.7* 12.7*  NEUTROABS 7.3  --   --   --   --   --   --   HGB 7.5*   < > 9.9* 7.8* 7.8* 8.0* 7.4*  HCT 22.1*   < > 27.7* 21.6* 21.8* 22.8* 21.8*  MCV 85.3   <  > 83.7 83.7 82.9 82.3 82.9  PLT 55*   < > 68* 60* 60* 76* 75*   < > = values in this interval not displayed.   Cardiac Enzymes: No results for input(s): CKTOTAL, CKMB, CKMBINDEX, TROPONINI in the last 168 hours. CBG: Recent Labs  Lab 08/16/19 0700 08/16/19 1118 08/16/19 1539 08/16/19 2122 08/17/19 0643  GLUCAP 183* 161* 161* 204* 172*    Iron Studies: No results for input(s): IRON, TIBC, TRANSFERRIN, FERRITIN in the last 72 hours. Studies/Results: Dg Chest Port 1 View  Result Date: 08/17/2019 CLINICAL DATA:  Follow-up examination for pneumonia. EXAM: PORTABLE CHEST 1 VIEW COMPARISON:  Prior radiograph from 08/15/2019. FINDINGS: Cardiomegaly, grossly stable. Mediastinal silhouette within normal limits. Lungs mildly hypoinflated with elevation of the left hemidiaphragm. Extensive bilateral airspace opacities again seen, slightly improved as compared to previous exam, now overall slightly less dense and consolidated as compared to previous. Degree of  underlying pulmonary interstitial congestion suspected, also improved. No obvious pleural effusion. No pneumothorax. Osseous structures are unchanged. IMPRESSION: 1. Persistent extensive bilateral airspace opacities, slightly improved as compared to previous exam. 2. Suspected degree of underlying pulmonary interstitial congestion, also improved. 3. No other new cardiopulmonary abnormality. Electronically Signed   By: Jeannine Boga M.D.   On: 08/17/2019 07:10   Dg Chest Port 1 View  Result Date: 08/15/2019 CLINICAL DATA:  Shortness of breath. EXAM: PORTABLE CHEST 1 VIEW COMPARISON:  07/11/2019 FINDINGS: Cardiomediastinal contours remain enlarged. Accentuated by portable technique. Patchy opacities are present bilaterally with some subpleural sparing. The more dense airspace component seen at the right lung base with signs of interstitial prominence as well. No acute bone process. IMPRESSION: 1. Findings suspicious for multifocal pneumonia  worse at the right lung base. 2. Superimposed and/or asymmetric edema is also considered, correlate with BNP. Electronically Signed   By: Zetta Bills M.D.   On: 08/15/2019 16:33   . sodium chloride   Intravenous Once  . sodium chloride   Intravenous Once  . aspirin EC  81 mg Oral Q0600  . atorvastatin  80 mg Oral Daily  . Chlorhexidine Gluconate Cloth  6 each Topical Daily  . clopidogrel  75 mg Oral Daily  . docusate sodium  100 mg Oral Daily  . gabapentin  300 mg Oral TID  . glipiZIDE  5 mg Oral BID AC  . insulin aspart  0-9 Units Subcutaneous TID WC  . insulin glargine  20 Units Subcutaneous QPM  . labetalol  300 mg Oral BID  . metolazone  5 mg Oral Daily  . NIFEdipine  90 mg Oral Daily  . pantoprazole  40 mg Oral Daily  . sodium chloride flush  3 mL Intravenous Q12H    BMET    Component Value Date/Time   NA 124 (L) 08/17/2019 0224   K 4.3 08/17/2019 0224   CL 89 (L) 08/17/2019 0224   CO2 21 (L) 08/17/2019 0224   GLUCOSE 189 (H) 08/17/2019 0224   BUN 91 (H) 08/17/2019 0224   CREATININE 3.45 (H) 08/17/2019 0224   CALCIUM 7.9 (L) 08/17/2019 0224   GFRNONAA 19 (L) 08/17/2019 0224   GFRAA 22 (L) 08/17/2019 0224   CBC    Component Value Date/Time   WBC 12.7 (H) 08/17/2019 0224   RBC 2.63 (L) 08/17/2019 0224   HGB 7.4 (L) 08/17/2019 0224   HCT 21.8 (L) 08/17/2019 0224   PLT 75 (L) 08/17/2019 0224   MCV 82.9 08/17/2019 0224   MCH 28.1 08/17/2019 0224   MCHC 33.9 08/17/2019 0224   RDW 13.5 08/17/2019 0224   LYMPHSABS 1.2 08/11/2019 1219   MONOABS 0.3 08/11/2019 1219   EOSABS 0.1 08/11/2019 1219   BASOSABS 0.0 08/11/2019 1219     Asessment/Plan: 1. AKI- likely multifactorial: Ischemic atn with ABLA and relative hypotension as well as contrast nephropathy and likely bladder outlet obstruction. Also need to rule out myeloma given profound AKI with small amount of iv contrast. 1. Renal US without obstructionand FeNa <1% consistent with pre-renal, given IVF's with  some improvement of Scr but developed volume overload.  Also had component of BOO with large PVR s/p foley without significant change. 2. Cr climbing and UOP has fallen despite escalating doses of lasix and metolazone.  Increase to 160 mg IV q6 and if not responding will likely require HD in the next 24 hours.  Discussed with Dr. Trula Slade who is in agreement. 2. Respiratory distress and hypoxemia-  pt with atypical infiltrates on CXR.  Still has crackles but only anteriorly.  CXR c/w multifocal Pneumonia.  Higher oxygen requirements.  On abx per primary but may need dialysis in the next 24 hours if respiratory status continues to decline. 3. ABLA- he was transfused 2 units PRBCs post-op.  Agree for another unit today but not 2 given volume overload. Continue to follow and transfuse prn 4. Hyponatremia- likely due to hypervolemia.  5. HTN- stable. 6. PAD- s/p left fem to post tibial artery bypass graft. Per Dr. Carlis Abbott. 7. Thrombocytopenia-chronic and worked up by Hematology without etiology, howeverwill d/c heparin and check HIT panel 8. DM type 2- poorly controlled. Agree with holding invokana with AKI and follow. 9. Deconditioning- working with PT  Donetta Potts, MD [facility] [phone]

## 2019-08-18 DIAGNOSIS — N179 Acute kidney failure, unspecified: Secondary | ICD-10-CM

## 2019-08-18 DIAGNOSIS — J9601 Acute respiratory failure with hypoxia: Secondary | ICD-10-CM | POA: Diagnosis not present

## 2019-08-18 LAB — CBC
HCT: 23.9 % — ABNORMAL LOW (ref 39.0–52.0)
Hemoglobin: 8.1 g/dL — ABNORMAL LOW (ref 13.0–17.0)
MCH: 28.2 pg (ref 26.0–34.0)
MCHC: 33.9 g/dL (ref 30.0–36.0)
MCV: 83.3 fL (ref 80.0–100.0)
Platelets: 77 10*3/uL — ABNORMAL LOW (ref 150–400)
RBC: 2.87 MIL/uL — ABNORMAL LOW (ref 4.22–5.81)
RDW: 13.3 % (ref 11.5–15.5)
WBC: 13.1 10*3/uL — ABNORMAL HIGH (ref 4.0–10.5)
nRBC: 0.5 % — ABNORMAL HIGH (ref 0.0–0.2)

## 2019-08-18 LAB — RENAL FUNCTION PANEL
Albumin: 1.9 g/dL — ABNORMAL LOW (ref 3.5–5.0)
Albumin: 1.9 g/dL — ABNORMAL LOW (ref 3.5–5.0)
Anion gap: 14 (ref 5–15)
Anion gap: 13 (ref 5–15)
BUN: 97 mg/dL — ABNORMAL HIGH (ref 6–20)
BUN: 68 mg/dL — ABNORMAL HIGH (ref 6–20)
CO2: 21 mmol/L — ABNORMAL LOW (ref 22–32)
CO2: 22 mmol/L (ref 22–32)
Calcium: 7.7 mg/dL — ABNORMAL LOW (ref 8.9–10.3)
Calcium: 8 mg/dL — ABNORMAL LOW (ref 8.9–10.3)
Chloride: 92 mmol/L — ABNORMAL LOW (ref 98–111)
Chloride: 96 mmol/L — ABNORMAL LOW (ref 98–111)
Creatinine, Ser: 4.04 mg/dL — ABNORMAL HIGH (ref 0.61–1.24)
Creatinine, Ser: 3.13 mg/dL — ABNORMAL HIGH (ref 0.61–1.24)
GFR calc Af Amer: 18 mL/min — ABNORMAL LOW (ref >60)
GFR calc Af Amer: 25 mL/min — ABNORMAL LOW (ref >60)
GFR calc non Af Amer: 16 mL/min — ABNORMAL LOW (ref >60)
GFR calc non Af Amer: 21 mL/min — ABNORMAL LOW (ref >60)
Glucose, Bld: 124 mg/dL — ABNORMAL HIGH (ref 70–99)
Glucose, Bld: 120 mg/dL — ABNORMAL HIGH (ref 70–99)
Phosphorus: 6.9 mg/dL — ABNORMAL HIGH (ref 2.5–4.6)
Phosphorus: 4.8 mg/dL — ABNORMAL HIGH (ref 2.5–4.6)
Potassium: 4.2 mmol/L (ref 3.5–5.1)
Potassium: 4.2 mmol/L (ref 3.5–5.1)
Sodium: 127 mmol/L — ABNORMAL LOW (ref 135–145)
Sodium: 131 mmol/L — ABNORMAL LOW (ref 135–145)

## 2019-08-18 LAB — GLUCOSE, CAPILLARY
Glucose-Capillary: 112 mg/dL — ABNORMAL HIGH (ref 70–99)
Glucose-Capillary: 114 mg/dL — ABNORMAL HIGH (ref 70–99)
Glucose-Capillary: 121 mg/dL — ABNORMAL HIGH (ref 70–99)
Glucose-Capillary: 129 mg/dL — ABNORMAL HIGH (ref 70–99)
Glucose-Capillary: 147 mg/dL — ABNORMAL HIGH (ref 70–99)

## 2019-08-18 LAB — MAGNESIUM: Magnesium: 3 mg/dL — ABNORMAL HIGH (ref 1.7–2.4)

## 2019-08-18 LAB — PROCALCITONIN: Procalcitonin: 3.11 ng/mL

## 2019-08-18 LAB — APTT: aPTT: 34 seconds (ref 24–36)

## 2019-08-18 MED ORDER — DARBEPOETIN ALFA 150 MCG/0.3ML IJ SOSY
150.0000 ug | PREFILLED_SYRINGE | INTRAMUSCULAR | Status: DC
  Administered 2019-08-18 – 2019-09-01 (×3): 150 ug via SUBCUTANEOUS
  Filled 2019-08-18 (×3): qty 0.3

## 2019-08-18 MED ORDER — LIP MEDEX EX OINT
TOPICAL_OINTMENT | CUTANEOUS | Status: DC | PRN
  Administered 2019-08-20 – 2019-08-22 (×11): via TOPICAL
  Filled 2019-08-18 (×2): qty 7

## 2019-08-18 MED ORDER — ASPIRIN EC 81 MG PO TBEC
81.0000 mg | DELAYED_RELEASE_TABLET | Freq: Every day | ORAL | Status: DC
  Administered 2019-08-19 – 2019-08-22 (×4): 81 mg via ORAL
  Filled 2019-08-18 (×5): qty 1

## 2019-08-18 MED ORDER — ORAL CARE MOUTH RINSE
15.0000 mL | Freq: Two times a day (BID) | OROMUCOSAL | Status: DC
  Administered 2019-08-18 (×2): 15 mL via OROMUCOSAL

## 2019-08-18 MED ORDER — CHLORHEXIDINE GLUCONATE 0.12 % MT SOLN
15.0000 mL | Freq: Two times a day (BID) | OROMUCOSAL | Status: DC
  Administered 2019-08-18 – 2019-08-19 (×3): 15 mL via OROMUCOSAL
  Filled 2019-08-18 (×2): qty 15

## 2019-08-18 NOTE — Progress Notes (Signed)
Initial Nutrition Assessment  DOCUMENTATION CODES:   Not applicable  INTERVENTION:    Once diet advanced- Nepro Shake po BID, each supplement provides 425 kcal and 19 grams protein  Renal MVI daily- transition to MVI with minerals once off CRRT.    NUTRITION DIAGNOSIS:   Increased nutrient needs related to acute illness(AKI requiring CRRT) as evidenced by estimated needs.  GOAL:   Patient will meet greater than or equal to 90% of their needs  MONITOR:   Diet advancement, Supplement acceptance, PO intake, Labs, Weight trends, I & O's, Skin  REASON FOR ASSESSMENT:   Rounds    ASSESSMENT:   Patient with PMH significant for HTN, HLD, DM, and PAD s/p bilateral common iliac stenting on 4/9 . Presents this admission with continued leg leg pain.   10/12- s/p L femoral PTA bypass 10/18- R IJ- start CRRT  Pt discussed during ICU rounds and with RN.   Pt unable to provide history. Being kept NPO due to mental status. On CRRT- pulling 225 ml/hr. Not requiring pressors. Will see if mental status improves enough for diet to be advanced. RD to provide supplements if so. If not consider Cortrak. Service offered Tuesday 8am-4pm.   I/O: +4,353 ml since admit  UOP: 173 ml x 24 hrs  CRRT: 822 ml x 24 hrs   Medications: aranesp, colace, SS novolog, lantus Labs: Na 127 (L) Phosphorus 6.9 (H) Mg 3.0 (H) corrected calcium 9.4 (wdl)   NUTRITION - FOCUSED PHYSICAL EXAM:    Most Recent Value  Orbital Region  No depletion  Upper Arm Region  No depletion  Thoracic and Lumbar Region  Unable to assess  Buccal Region  No depletion  Temple Region  No depletion  Clavicle Bone Region  No depletion  Clavicle and Acromion Bone Region  No depletion  Scapular Bone Region  Unable to assess  Dorsal Hand  No depletion  Patellar Region  No depletion  Anterior Thigh Region  No depletion  Posterior Calf Region  No depletion  Edema (RD Assessment)  Severe  Hair  Reviewed  Eyes  Reviewed  Mouth   Reviewed  Skin  Reviewed  Nails  Reviewed     Diet Order:   Diet Order    None      EDUCATION NEEDS:   Not appropriate for education at this time  Skin:  Skin Assessment: Skin Integrity Issues: Skin Integrity Issues:: Incisions Incisions: L leg  Last BM:  10/12  Height:   Ht Readings from Last 1 Encounters:  08/11/19 6\' 1"  (1.854 m)    Weight:   Wt Readings from Last 1 Encounters:  08/18/19 102.6 kg    Ideal Body Weight:  83.6 kg  BMI:  Body mass index is 29.84 kg/m.  Estimated Nutritional Needs:   Kcal:  2300-2500 kcal  Protein:  115-130 grams  Fluid:  >/= 2 L/day  Mariana Single RD, LDN Clinical Nutrition Pager # - [phone]

## 2019-08-18 NOTE — Progress Notes (Signed)
[facility]  Subjective  - Started on CRRT last night after placement of RIJ vas cath with assistance from CCM.   Objective 105/63 75 98 F (36.7 C) (Oral) 12 96%  Intake/Output Summary (Last 24 hours) at 08/18/2019 0745 Last data filed at 08/18/2019 0700 Gross per 24 hour  Intake 808.42 ml  Output 995 ml  Net -186.58 ml    Left PT brisk signal Left groin incision c/d/i Left leg incisions c/d/i Volume overload [place] 15 L  Laboratory Lab Results: Recent Labs    08/17/19 0224  08/17/19 2058 08/18/19 0333  WBC 12.7*  --   --  13.1*  HGB 7.4*   < > 8.8* 8.1*  HCT 21.8*   < > 26.0* 23.9*  PLT 75*  --   --  77*   < > = values in this interval not displayed.   BMET Recent Labs    08/17/19 1227 08/17/19 2058 08/18/19 0333  NA 124* 124* 127*  K 3.8 4.1 4.2  CL 88*  --  92*  CO2 19*  --  21*  GLUCOSE 150*  --  124*  BUN 99*  --  97*  CREATININE 4.05*  --  4.04*  CALCIUM 8.2*  --  7.7*    COAG Lab Results  Component Value Date   INR 1.2 08/07/2019   INR 1.3 (H) 05/27/2019   No results found for: PTT  Assessment/Planning:  Postop day 7 status post left common femoral to PT bypass with composite PTFE and GSV. Continues to have a very brisk posterior tibial signal in the left foot.   AKI post-op now progressing to oliguric renal failure.  Tried aggressive diuresis with high dose lasix and no improvement with worsening respiratory status.  Started on CRRT last night.  Pulling off volume.  Hyponatremia likely related to volume overload.  ABLA post-op and got 1 unit pRBC over weekend.  Hgb 8.1 today.  No indication for transfusion at this time.  Has chronic thrombocytopenia that was worked up by hematology prior to surgery and previously had trial of steroids with no improvement. Platelets are 77, HIT negative.  Wean oxygen with CRRT pulling off volume, now on 15 L [place].   Hopefully mental status improves as BUN improves with  CRRT.     [name] 08/18/2019 7:45 AM --

## 2019-08-18 NOTE — Consult Note (Signed)
   NAME:  Edward Rocha., MRN:  [id number], DOB:  May 06, 1966, LOS: 7 ADMISSION DATE:  08/11/2019, CONSULTATION DATE:  08/17/19, CHIEF COMPLAINT:  SOB  Brief History    53 yo with history of DM, HTN, HLD, thrombocytopenia, and PAD s/p aortogram with left iliac arteriogram prior to left femoral to PTA bypass graft placement on 08/11/19.  Developed oliguric renal failure, CCM consulted 10/18 due to worsening hypoxia and fluid overload   Past Medical History  DM HTN HLD Thrombocytopenia PAD AKI  Significant Hospital Events   PTA on 08/11/19  Consults:  PCCM Nephro Surgery  Procedures:  RIJ HD cath 10/18 >>  Significant Diagnostic Tests:  10/14  renal ultrasound >> no hnosis  Micro Data:  Covid negative.  Antimicrobials:  10/16  Cefepime >> 10/18 BC >>  Interim history/subjective:  Progressively worsening renal function. Now volume overloaded.   Objective   Blood pressure (!) 102/59, pulse 77, temperature (!) 96.5 F (35.8 C), temperature source Axillary, resp. rate (!) 9, height 6\' 1"  (1.854 m), weight 102.6 kg, SpO2 96 %.    FiO2 (%):  [100 %] 100 %   Intake/Output Summary (Last 24 hours) at 08/18/2019 1038 Last data filed at 08/18/2019 1000 Gross per 24 hour  Intake 808.42 ml  Output 1580 ml  Net -771.58 ml   Filed Weights   08/15/19 1710 08/17/19 0600 08/18/19 0500  Weight: 104.1 kg 104 kg 102.6 kg    Examination: General: Well-built well-nourished man, sitting up in bed, on high flow nasal cannula HENT: Moist mucous membranes , mild pallor, no icterus, JVD plus Lungs: Crackles diffusely Cardiovascular: Regualr rate and rhythm, no rub Abdomen: Distended but not tnese Extremities: 2+ edema Neuro: Interactive, nonfocal GU: Foley in place.   Chest x-Shomaker 10/18 personally reviewed which shows bilateral multifocal patchy infiltrates?  Edema versus pneumonia  Labs show stable hyponatremia, stable creatinine at 4.0, normal electrolytes, mild leukocytosis  and stable anemia  Resolved Hospital Problem list   NA   Assessment & Plan:  Developed AKI status post left femoral bypass with fluid overload  Acute hypoxic respiratory failure -pneumonia versus edema -Empiric cefepime , obtain procalcitonin to clarify -Volume removal with CRRT  AKI with volume overload -favor ischemic ATN due to hypotension + contrast insult -CRRT per renal, -Follow hyponatremia and electrolytes.  Acute encephalopathy-resolving  Thrombocytopenia appears to be chronic, no heparin per CRRT       Best practice:  Diet: NPO for now Pain/Anxiety/Delirium protocol (if indicated): per primary VAP protocol (if indicated): NA DVT prophylaxis: Heparin GI prophylaxis: NA Mobility: As toelrated Code Status: Full for now Family Communication:  wife 10/18 Disposition: ICU   The patient is critically ill with multiple organ systems failure and requires high complexity decision making for assessment and support, frequent evaluation and titration of therapies, application of advanced monitoring technologies and extensive interpretation of multiple databases. Critical Care Time devoted to patient care services described in this note independent of APP/resident  time is 31 minutes.   Kara Mead MD. Shade Flood. [facility]  If no response to pager , please call [phone]   08/18/2019

## 2019-08-18 NOTE — Progress Notes (Signed)
Patient ID: Edward Rocha., male   DOB: 1965-11-18, 53 y.o.   MRN: [id number] S: Remained oligoanuric on max does diuretics, CCM saw pt and rec CRRT- it was started last night. Removing 200 per hour so far tolerating- no heparin, all 4 k dialysate bags   O:BP 100/60   Pulse 79   Temp 98 F (36.7 C) (Oral)   Resp 12   Ht 6\' 1"  (1.854 m)   Wt 102.6 kg   SpO2 96%   BMI 29.84 kg/m   Intake/Output Summary (Last 24 hours) at 08/18/2019 0645 Last data filed at 08/18/2019 0600 Gross per 24 hour  Intake 808.42 ml  Output 821 ml  Net -12.58 ml   Intake/Output: I/O last 3 completed shifts: In: 1155.6 [P.O.:280; I.V.:43.3; Blood:381.7; IV Piggyback:450.6] Out: 64 [Urine:488]  Intake/Output this shift:  Total I/O In: 145.4 [P.O.:30; Other:80; IV Piggyback:35.4] Out: 668 [Urine:20; Other:648] Weight change: -1.4 kg Gen: ill-appearing -on [place] but not that talkative - new right IJ temp cath placed 10/18 CVS: no rub Resp: bilateral rales Abd: +BS, soft, NT Ext: 2+ edema L>R  Recent Labs  Lab 08/13/19 0455 08/14/19 0352 08/15/19 0224 08/16/19 0228 08/17/19 0224 08/17/19 1227 08/17/19 2058 08/18/19 0333  NA 125* 125* 123* 124* 124* 124* 124* 127*  K 4.2 3.4* 3.5 3.8 4.3 3.8 4.1 4.2  CL 92* 92* 92* 92* 89* 88*  --  92*  CO2 21* 21* 21* 20* 21* 19*  --  21*  GLUCOSE 152* 173* 166* 174* 189* 150*  --  124*  BUN 40* 53* 59* 70* 91* 99*  --  97*  CREATININE 2.73* 3.09* 2.75* 2.55* 3.45* 4.05*  --  4.04*  ALBUMIN  --  2.4* 2.2* 2.1* 2.1*  --   --  1.9*  CALCIUM 7.6* 7.7* 7.7* 7.9* 7.9* 8.2*  --  7.7*  PHOS  --  4.0 4.3 5.7* 7.9*  --   --  6.9*   Liver Function Tests: Recent Labs  Lab 08/16/19 0228 08/17/19 0224 08/18/19 0333  ALBUMIN 2.1* 2.1* 1.9*   No results for input(s): LIPASE, AMYLASE in the last 168 hours. No results for input(s): AMMONIA in the last 168 hours. CBC: Recent Labs  Lab 08/11/19 1219  08/14/19 0352 08/15/19 0224 08/16/19 0228 08/17/19 0224  08/17/19 1842 08/17/19 2058 08/18/19 0333  WBC 9.0   < > 15.1* 13.3* 13.7* 12.7*  --   --  13.1*  NEUTROABS 7.3  --   --   --   --   --   --   --   --   HGB 7.5*   < > 7.8* 7.8* 8.0* 7.4* 8.8* 8.8* 8.1*  HCT 22.1*   < > 21.6* 21.8* 22.8* 21.8* 25.5* 26.0* 23.9*  MCV 85.3   < > 83.7 82.9 82.3 82.9  --   --  83.3  PLT 55*   < > 60* 60* 76* 75*  --   --  77*   < > = values in this interval not displayed.   Cardiac Enzymes: No results for input(s): CKTOTAL, CKMB, CKMBINDEX, TROPONINI in the last 168 hours. CBG: Recent Labs  Lab 08/17/19 0643 08/17/19 1120 08/17/19 1628 08/17/19 2055 08/18/19 0012  GLUCAP 172* 145* 149* 161* 147*    Iron Studies: No results for input(s): IRON, TIBC, TRANSFERRIN, FERRITIN in the last 72 hours. Studies/Results: Dg Chest Port 1 View  Result Date: 08/17/2019 CLINICAL DATA:  52 year old male with central line placement. EXAM: PORTABLE  CHEST 1 VIEW COMPARISON:  Earlier radiograph dated 08/17/2019 FINDINGS: Interval placement of a right IJ central venous line with tip over right atrial silhouette. No pneumothorax. Similar or slightly progressed pulmonary densities. Stable appearance of the cardiomediastinal silhouette. Atherosclerotic calcification of the aortic arch. No acute osseous pathology. IMPRESSION: 1. Interval placement of a right IJ central venous line. No pneumothorax. 2. Similar or slightly progressed pulmonary densities. Electronically Signed   By: Anner Crete M.D.   On: 08/17/2019 23:16   Dg Chest Port 1 View  Result Date: 08/17/2019 CLINICAL DATA:  Follow-up examination for pneumonia. EXAM: PORTABLE CHEST 1 VIEW COMPARISON:  Prior radiograph from 08/15/2019. FINDINGS: Cardiomegaly, grossly stable. Mediastinal silhouette within normal limits. Lungs mildly hypoinflated with elevation of the left hemidiaphragm. Extensive bilateral airspace opacities again seen, slightly improved as compared to previous exam, now overall slightly less dense and  consolidated as compared to previous. Degree of underlying pulmonary interstitial congestion suspected, also improved. No obvious pleural effusion. No pneumothorax. Osseous structures are unchanged. IMPRESSION: 1. Persistent extensive bilateral airspace opacities, slightly improved as compared to previous exam. 2. Suspected degree of underlying pulmonary interstitial congestion, also improved. 3. No other new cardiopulmonary abnormality. Electronically Signed   By: Jeannine Boga M.D.   On: 08/17/2019 07:10   . sodium chloride   Intravenous Once  . aspirin EC  81 mg Oral Daily  . atorvastatin  80 mg Oral Daily  . Chlorhexidine Gluconate Cloth  6 each Topical Daily  . clopidogrel  75 mg Oral Daily  . docusate sodium  100 mg Oral Daily  . gabapentin  300 mg Oral TID  . heparin injection (subcutaneous)  5,000 Units Subcutaneous Q8H  . insulin aspart  0-9 Units Subcutaneous TID WC  . insulin glargine  20 Units Subcutaneous QPM  . pantoprazole  40 mg Oral Daily  . sodium chloride flush  3 mL Intravenous Q12H    BMET    Component Value Date/Time   NA 127 (L) 08/18/2019 0333   K 4.2 08/18/2019 0333   CL 92 (L) 08/18/2019 0333   CO2 21 (L) 08/18/2019 0333   GLUCOSE 124 (H) 08/18/2019 0333   BUN 97 (H) 08/18/2019 0333   CREATININE 4.04 (H) 08/18/2019 0333   CALCIUM 7.7 (L) 08/18/2019 0333   GFRNONAA 16 (L) 08/18/2019 0333   GFRAA 18 (L) 08/18/2019 0333   CBC    Component Value Date/Time   WBC 13.1 (H) 08/18/2019 0333   RBC 2.87 (L) 08/18/2019 0333   HGB 8.1 (L) 08/18/2019 0333   HCT 23.9 (L) 08/18/2019 0333   PLT 77 (L) 08/18/2019 0333   MCV 83.3 08/18/2019 0333   MCH 28.2 08/18/2019 0333   MCHC 33.9 08/18/2019 0333   RDW 13.3 08/18/2019 0333   LYMPHSABS 1.2 08/11/2019 1219   MONOABS 0.3 08/11/2019 1219   EOSABS 0.1 08/11/2019 1219   BASOSABS 0.0 08/11/2019 1219     Asessment/Plan: 1. AKI-  (on 07/09/19 crt 0.96) likely multifactorial: Ischemic atn with ABLA and  relative hypotension as well as contrast nephropathy and likely bladder outlet obstruction. myeloma screen seeming negative.  Renal US without obstructionand FeNa <1% consistent with pre-renal, given IVF's with some improvement of Scr but developed volume overload.  Also had component of BOO with large PVR s/p foley without significant change.Cr climbing and UOP has fallen despite escalating doses of lasix and metolazone. Now is CRRT requiring- started last night via a right sided IJ temp cath placed 10/18 2. Respiratory distress and  hypoxemia- pt with atypical infiltrates on CXR.  Still has crackles but only anteriorly.  CXR c/w multifocal Pneumonia.  Higher oxygen requirements.  On abx per primary.  Will stop the neurontin given his renal failure  3. ABLA- he was transfused 2 units PRBCs post-op.  Agree for another unit today but not 2 given volume overload. Continue to follow and transfuse prn.  Will add ESA  4. Hyponatremia- likely due to hypervolemia. improved already this AM 5. HTN- BP soft, no meds, UF as able with CRRT  6. PAD- s/p left fem to post tibial artery bypass graft. Per Dr. Carlis Abbott. 7. Thrombocytopenia-chronic and worked up by Hematology without etiology, howeverno heparin with CRRT  HIT panel seems to be negative  8. DM type 2- poorly controlled. Agree with holding invokana with AKI and follow. 9. Deconditioning- working with PT 10. Elytes- K is ok on 4 k bath, phos and corrected calcium OK 11. Metabolic acidosis- due to renal failure- CRRT should correct   [place] [phone]

## 2019-08-18 NOTE — Progress Notes (Signed)
PT Cancellation Note  Patient Details Name: Edward Rocha. MRN: [id number] DOB: April 25, 1966   Cancelled Treatment:    Reason Eval/Treat Not Completed: Patient's level of consciousness;Medical issues which prohibited therapy Per RN pt continues to be obtunded. PT will continue to follow acutely.    Earney Navy, PTA Acute Rehabilitation Services Pager: [phone] Office: [phone]   08/18/2019, 12:22 PM

## 2019-08-19 ENCOUNTER — Inpatient Hospital Stay (HOSPITAL_COMMUNITY): Payer: Managed Care, Other (non HMO)

## 2019-08-19 ENCOUNTER — Encounter (HOSPITAL_COMMUNITY): Payer: Self-pay

## 2019-08-19 DIAGNOSIS — J810 Acute pulmonary edema: Secondary | ICD-10-CM | POA: Diagnosis not present

## 2019-08-19 DIAGNOSIS — J9601 Acute respiratory failure with hypoxia: Secondary | ICD-10-CM | POA: Diagnosis not present

## 2019-08-19 DIAGNOSIS — N179 Acute kidney failure, unspecified: Secondary | ICD-10-CM | POA: Diagnosis not present

## 2019-08-19 LAB — RENAL FUNCTION PANEL
Albumin: 1.9 g/dL — ABNORMAL LOW (ref 3.5–5.0)
Albumin: 2 g/dL — ABNORMAL LOW (ref 3.5–5.0)
Anion gap: 12 (ref 5–15)
Anion gap: 11 (ref 5–15)
BUN: 48 mg/dL — ABNORMAL HIGH (ref 6–20)
BUN: 42 mg/dL — ABNORMAL HIGH (ref 6–20)
CO2: 24 mmol/L (ref 22–32)
CO2: 24 mmol/L (ref 22–32)
Calcium: 7.9 mg/dL — ABNORMAL LOW (ref 8.9–10.3)
Calcium: 8.1 mg/dL — ABNORMAL LOW (ref 8.9–10.3)
Chloride: 97 mmol/L — ABNORMAL LOW (ref 98–111)
Chloride: 99 mmol/L (ref 98–111)
Creatinine, Ser: 2.22 mg/dL — ABNORMAL HIGH (ref 0.61–1.24)
Creatinine, Ser: 1.96 mg/dL — ABNORMAL HIGH (ref 0.61–1.24)
GFR calc Af Amer: 37 mL/min — ABNORMAL LOW (ref >60)
GFR calc Af Amer: 43 mL/min — ABNORMAL LOW (ref >60)
GFR calc non Af Amer: 32 mL/min — ABNORMAL LOW (ref >60)
GFR calc non Af Amer: 37 mL/min — ABNORMAL LOW (ref >60)
Glucose, Bld: 140 mg/dL — ABNORMAL HIGH (ref 70–99)
Glucose, Bld: 115 mg/dL — ABNORMAL HIGH (ref 70–99)
Phosphorus: 4 mg/dL (ref 2.5–4.6)
Phosphorus: 2.4 mg/dL — ABNORMAL LOW (ref 2.5–4.6)
Potassium: 4.4 mmol/L (ref 3.5–5.1)
Potassium: 3.9 mmol/L (ref 3.5–5.1)
Sodium: 133 mmol/L — ABNORMAL LOW (ref 135–145)
Sodium: 134 mmol/L — ABNORMAL LOW (ref 135–145)

## 2019-08-19 LAB — CBC
HCT: 26.9 % — ABNORMAL LOW (ref 39.0–52.0)
Hemoglobin: 8.7 g/dL — ABNORMAL LOW (ref 13.0–17.0)
MCH: 27.9 pg (ref 26.0–34.0)
MCHC: 32.3 g/dL (ref 30.0–36.0)
MCV: 86.2 fL (ref 80.0–100.0)
Platelets: 86 10*3/uL — ABNORMAL LOW (ref 150–400)
RBC: 3.12 MIL/uL — ABNORMAL LOW (ref 4.22–5.81)
RDW: 13.9 % (ref 11.5–15.5)
WBC: 13.7 10*3/uL — ABNORMAL HIGH (ref 4.0–10.5)
nRBC: 0.6 % — ABNORMAL HIGH (ref 0.0–0.2)

## 2019-08-19 LAB — GLUCOSE, CAPILLARY
Glucose-Capillary: 137 mg/dL — ABNORMAL HIGH (ref 70–99)
Glucose-Capillary: 114 mg/dL — ABNORMAL HIGH (ref 70–99)
Glucose-Capillary: 118 mg/dL — ABNORMAL HIGH (ref 70–99)

## 2019-08-19 LAB — MAGNESIUM: Magnesium: 3.1 mg/dL — ABNORMAL HIGH (ref 1.7–2.4)

## 2019-08-19 LAB — APTT: aPTT: 47 seconds — ABNORMAL HIGH (ref 24–36)

## 2019-08-19 LAB — PROCALCITONIN: Procalcitonin: 1.54 ng/mL

## 2019-08-19 MED ORDER — ENSURE ENLIVE PO LIQD
237.0000 mL | Freq: Three times a day (TID) | ORAL | Status: DC
  Administered 2019-08-19 (×2): 237 mL via ORAL

## 2019-08-19 MED ORDER — FENTANYL CITRATE (PF) 100 MCG/2ML IJ SOLN
25.0000 ug | INTRAMUSCULAR | Status: DC | PRN
  Administered 2019-08-19: 25 ug via INTRAVENOUS
  Administered 2019-08-19 (×2): 50 ug via INTRAVENOUS
  Administered 2019-08-19: 25 ug via INTRAVENOUS
  Administered 2019-08-20 – 2019-08-21 (×14): 50 ug via INTRAVENOUS
  Filled 2019-08-19 (×17): qty 2

## 2019-08-19 MED ORDER — ORAL CARE MOUTH RINSE
15.0000 mL | Freq: Two times a day (BID) | OROMUCOSAL | Status: DC
  Administered 2019-08-19 (×2): 15 mL via OROMUCOSAL

## 2019-08-19 MED ORDER — CHLORHEXIDINE GLUCONATE 0.12 % MT SOLN
15.0000 mL | Freq: Two times a day (BID) | OROMUCOSAL | Status: DC
  Administered 2019-08-19 (×2): 15 mL via OROMUCOSAL
  Filled 2019-08-19: qty 15

## 2019-08-19 NOTE — Progress Notes (Signed)
Arrived to get report and pt on HFNC appearing asleep, RR 9.  Pt had rec'd Dilaudid around 4am, arrousable but lethargic, but even when awake RR slower than I would like.  Orientation questions, although answered correctly, required several promptings to get patient to answer.  RT called and pt placed on his CPaP.    Dr Carlis Abbott at bedside shortly after that, aware of pt's lethargy and subsequent CPAP use.    Karsten Ro, RN

## 2019-08-19 NOTE — Progress Notes (Addendum)
NAME:  Edward Rocha., MRN:  [id number], DOB:  April 19, 1966, LOS: 8 ADMISSION DATE:  08/11/2019, CONSULTATION DATE:  08/17/19, CHIEF COMPLAINT:  SOB  Brief History    53 yo with history of DM, HTN, HLD, thrombocytopenia, and PAD s/p aortogram with left iliac arteriogram prior to left femoral to PTA bypass graft placement on 08/11/19.  Developed oliguric renal failure, CCM consulted 10/18 due to worsening hypoxia and fluid overload   Past Medical History  DM HTN HLD Thrombocytopenia PAD AKI  Significant Hospital Events   PTA on 08/11/19  Consults:  PCCM Nephro Surgery  Procedures:  RIJ HD cath 10/18 >>  Significant Diagnostic Tests:  10/14  renal ultrasound >> no hnosis  Micro Data:  Covid negative.  Antimicrobials:  10/16  Cefepime >> 10/18 BC >>ng  Interim history/subjective:   Remains critically ill on CRRT Placed on CPAP this morning after he was somnolent after Dilaudid given at 4 AM Remains hypoxic on high flow oxygen and now blended to CPAP  Objective   Blood pressure 120/66, pulse 80, temperature (!) 97.5 F (36.4 C), temperature source Axillary, resp. rate 13, height 6\' 1"  (1.854 m), weight 94.1 kg, SpO2 99 %.        Intake/Output Summary (Last 24 hours) at 08/19/2019 0844 Last data filed at 08/19/2019 0800 Gross per 24 hour  Intake 200.06 ml  Output 5704 ml  Net -5503.94 ml   Filed Weights   08/17/19 0600 08/18/19 0500 08/19/19 0500  Weight: 104 kg 102.6 kg 94.1 kg    Examination: General: Well-built well-nourished man, lying in bed, on CPAP full facemask HENT: Moist mucous membranes , mild pallor, no icterus, JVD plus Lungs: Bilateral scattered crackles diffusely Cardiovascular: Regualr rate and rhythm, no rub Abdomen: Distended nontender hypoactive bowel sounds Extremities: 2+ edema Neuro: Interactive, nonfocal GU: Foley in place.   Chest x-Stemler 10/ 20 personally reviewed which shows bilateral multifocal patchy infiltrates?  Edema  versus pneumonia -improved compared to 10/18  Labs show improving hyponatremia, decreasing creatinine, stable leukocytosis and thrombocytopenia and anemia  Resolved Hospital Problem list   NA   Assessment & Plan:  Developed AKI status post left femoral bypass with fluid overload  Acute hypoxic respiratory failure -pneumonia versus edema , procalcitonin slight high, chest x-[place] improving -Empiric cefepime x 7 days -Volume removal with CRRT -Using high flow nasal cannula when awake and CPAP with oxygen blended during sleep  AKI with volume overload -favor ischemic ATN due to hypotension + contrast insult Minimal urine output, no signs of renal recovery yet -CRRT per renal, keeping -200/h -Follow electrolytes  Acute encephalopathy-resolving ,dc Dilaudid and use fentanyl 25-50 as needed  Thrombocytopenia appears to be chronic, no heparin per CRRT   Extreme deconditioning-needs PT    Best practice:  Diet: NPO for now Pain/Anxiety/Delirium protocol (if indicated): per primary VAP protocol (if indicated): NA DVT prophylaxis: Heparin GI prophylaxis: NA Mobility: As toelrated Code Status: Full for now Family Communication:  wife 10/18 , per vascular Disposition: ICU   The patient is critically ill with multiple organ systems failure and requires high complexity decision making for assessment and support, frequent evaluation and titration of therapies, application of advanced monitoring technologies and extensive interpretation of multiple databases. Critical Care Time devoted to patient care services described in this note independent of APP/resident  time is 31 minutes.    Kara Mead MD. Shade Flood. [facility]  If no response to pager , please call [phone]  08/19/2019     

## 2019-08-19 NOTE — Progress Notes (Signed)
[facility] this am, on cpap after narcotics.  Tolerated CRRT yesterday and overnight.   Objective (!) 104/55 73 (!) 97.5 F (36.4 C) (Axillary) (!) 7 100%  Intake/Output Summary (Last 24 hours) at 08/19/2019 0813 Last data filed at 08/19/2019 0700 Gross per 24 hour  Intake 200.06 ml  Output 5488 ml  Net -5287.94 ml    Left PT brisk signal Left groin incision c/d/i Left leg incisions c/d/i Volume overload CPAP  Laboratory Lab Results: Recent Labs    08/17/19 0224  08/17/19 2058 08/18/19 0333  WBC 12.7*  --   --  13.1*  HGB 7.4*   < > 8.8* 8.1*  HCT 21.8*   < > 26.0* 23.9*  PLT 75*  --   --  77*   < > = values in this interval not displayed.   BMET Recent Labs    08/18/19 1623 08/19/19 0721  NA 131* 133*  K 4.2 4.4  CL 96* 97*  CO2 22 24  GLUCOSE 120* 140*  BUN 68* 48*  CREATININE 3.13* 2.22*  CALCIUM 8.0* 7.9*    COAG Lab Results  Component Value Date   INR 1.2 08/07/2019   INR 1.3 (H) 05/27/2019   No results found for: PTT  Assessment/Planning:  Postop day 8 status post left common femoral to PT bypass with composite PTFE and GSV. Continues to have a very brisk posterior tibial signal in the left foot.   AKI post-op now progressing to oliguric renal failure.  Tried aggressive diuresis with high dose lasix and no improvement with worsening respiratory status.  Started on CRRT two days ago.  Pulling off volume, -5 L yesterday.  Hyponatremia likely related to volume overload - improving with CRRT.  ABLA post-op and got 1 unit pRBC over weekend.  Hgb 8.1 yesterday.  No indication for transfusion at this time.  Has chronic thrombocytopenia that was worked up by hematology prior to surgery and previously had trial of steroids with no improvement. Platelets are 77, HIT negative.  Continue to wean oxygen with CRRT.  Also on cefepime and being treated for pneumonia.  Hopefully mental status  improves as BUN improves with CRRT.  Will stay in ICU today with oxygen requirement.  Appreciate nephrology input.     [name] 08/19/2019 8:13 AM --

## 2019-08-19 NOTE — Progress Notes (Addendum)
Subjective:  Remains on CRRT at 200 ml/hr. Required CPAP placement early this morning due to lethargy after receiving dilaudid around 4 am.   Objective Vital signs in last 24 hours: Vitals:   08/19/19 0530 08/19/19 0600 08/19/19 0630 08/19/19 0700  BP: 116/65 (!) 117/52 106/65 (!) 104/55  Pulse: 78 74 74 73  Resp: 13 (!) 9 (!) 9 (!) 7  Temp:      TempSrc:      SpO2: 100% 100% 100% 100%  Weight:      Height:       Weight change: -8.5 kg  Intake/Output Summary (Last 24 hours) at 08/19/2019 0729 Last data filed at 08/19/2019 0700 Gross per 24 hour  Intake 200.06 ml  Output 5686 ml  Net -5485.94 ml    Assessment/ Plan: Pt is a 53 y.o. yo male who was admitted on 08/11/2019 for left femoral to PTA bypass graft placement and subsequently developed oliguric renal failure requiring CRRT.   Assessment/Plan: 1. AKI (baseline crt ~1) - multifactorial in the setting of ischemic ATN with ABLA and relative hypotension, contrast nephropathy and BOO. Ultimately required initiation of CRRT on 10/18 due to profound volume overload after failing high doses of Lasix and metolazone. Labs and volume status are improving as a result of CRRT.  Continue CRRT at 200 ml/hr- same settings.  2. Acute hypoxic respiratory failure - volume overload vs multifocal pneumonia. Mildly elevated white count and procal. Is on empiric coverage with cefepime x7 days. CXR with slight improvement compared to yesterday.  3. ABLA - received total of 3 units since admission. Hgb stable at 8.7. Continue ESA and transfuse as needed.  4. Hyponatremia due to hypervolemia - continues to improve with fluid removal  5. HTN - BP soft; no meds; UF as able with CRRT  6. Thrombocytopenia - chronic and worked up by hematology without etiology. HIT panel negative. On heparin.   7. DM type 2 - poorly controlled. Holding Invokana due to AKI. SSI as needed  8. Elytes - K stable on 4 k bath; phos and corrected calcium ok  9. Metabolic  acidosis - due to acute renal failure; improving on CRRT   Delice Bison  PGY-2  Patient seen and examined, agree with above note with above modifications. Tolerating CRRT well, removed 5 liters since starting which I think is a good amount of UF daily- UOP falling off not surprisingly.  Elytes are stable and do not need repletion, hgb stable as well.  No changes to stable CRRT  Corliss Parish, MD 08/19/2019     Labs: Basic Metabolic Panel: Recent Labs  Lab 08/17/19 0224 08/17/19 1227 08/17/19 2058 08/18/19 0333 08/18/19 1623  NA 124* 124* 124* 127* 131*  K 4.3 3.8 4.1 4.2 4.2  CL 89* 88*  --  92* 96*  CO2 21* 19*  --  21* 22  GLUCOSE 189* 150*  --  124* 120*  BUN 91* 99*  --  97* 68*  CREATININE 3.45* 4.05*  --  4.04* 3.13*  CALCIUM 7.9* 8.2*  --  7.7* 8.0*  PHOS 7.9*  --   --  6.9* 4.8*   Liver Function Tests: Recent Labs  Lab 08/17/19 0224 08/18/19 0333 08/18/19 1623  ALBUMIN 2.1* 1.9* 1.9*   No results for input(s): LIPASE, AMYLASE in the last 168 hours. No results for input(s): AMMONIA in the last 168 hours. CBC: Recent Labs  Lab 08/14/19 0352 08/15/19 0224 08/16/19 0228 08/17/19 0224 08/17/19 1842 08/17/19 [id number]  08/18/19 0333  WBC 15.1* 13.3* 13.7* 12.7*  --   --  13.1*  HGB 7.8* 7.8* 8.0* 7.4* 8.8* 8.8* 8.1*  HCT 21.6* 21.8* 22.8* 21.8* 25.5* 26.0* 23.9*  MCV 83.7 82.9 82.3 82.9  --   --  83.3  PLT 60* 60* 76* 75*  --   --  77*   Cardiac Enzymes: No results for input(s): CKTOTAL, CKMB, CKMBINDEX, TROPONINI in the last 168 hours. CBG: Recent Labs  Lab 08/18/19 0012 08/18/19 0657 08/18/19 1206 08/18/19 1530 08/18/19 2154  GLUCAP 147* 114* 121* 129* 112*    Iron Studies: No results for input(s): IRON, TIBC, TRANSFERRIN, FERRITIN in the last 72 hours. Studies/Results: Dg Chest Port 1 View  Result Date: 08/19/2019 CLINICAL DATA:  Acute respiratory failure. EXAM: PORTABLE CHEST 1 VIEW COMPARISON:  08/17/2019. FINDINGS: Right IJ  line noted in stable position. Stable cardiomegaly. Diffuse severe bilateral pulmonary infiltrates/edema again noted. Slight interim improvement. No pleural effusion or pneumothorax. IMPRESSION: 1.  Right IJ line in stable position. 2.  Stable cardiomegaly. 3. Diffuse severe bilateral pulmonary infiltrates/edema again noted. Slight interim improvement. Electronically Signed   By: Marcello Moores  Register   On: 08/19/2019 06:46   Dg Chest Port 1 View  Result Date: 08/17/2019 CLINICAL DATA:  53 year old male with central line placement. EXAM: PORTABLE CHEST 1 VIEW COMPARISON:  Earlier radiograph dated 08/17/2019 FINDINGS: Interval placement of a right IJ central venous line with tip over right atrial silhouette. No pneumothorax. Similar or slightly progressed pulmonary densities. Stable appearance of the cardiomediastinal silhouette. Atherosclerotic calcification of the aortic arch. No acute osseous pathology. IMPRESSION: 1. Interval placement of a right IJ central venous line. No pneumothorax. 2. Similar or slightly progressed pulmonary densities. Electronically Signed   By: Anner Crete M.D.   On: 08/17/2019 23:16   Medications: Infusions: .  prismasol BGK 4/2.5 400 mL/hr at 08/19/19 0108  .  prismasol BGK 4/2.5 200 mL/hr at 08/19/19 0158  . sodium chloride Stopped (08/16/19 [id number])  . sodium chloride    . ceFEPime (MAXIPIME) IV 2 g (08/18/19 2146)  . magnesium sulfate bolus IVPB    . prismasol BGK 4/2.5 1,800 mL/hr at 08/19/19 0446    Scheduled Medications: . sodium chloride   Intravenous Once  . aspirin EC  81 mg Oral Daily  . atorvastatin  80 mg Oral Daily  . chlorhexidine  15 mL Mouth Rinse BID  . Chlorhexidine Gluconate Cloth  6 each Topical Daily  . clopidogrel  75 mg Oral Daily  . darbepoetin (ARANESP) injection - NON-DIALYSIS  150 mcg Subcutaneous Q Mon-1800  . docusate sodium  100 mg Oral Daily  . heparin injection (subcutaneous)  5,000 Units Subcutaneous Q8H  . insulin aspart  0-9  Units Subcutaneous TID WC  . insulin glargine  20 Units Subcutaneous QPM  . mouth rinse  15 mL Mouth Rinse q12n4p  . pantoprazole  40 mg Oral Daily  . sodium chloride flush  3 mL Intravenous Q12H    have reviewed scheduled and prn medications.  Physical Exam: General: ill-appearing gentleman. Drowsy but arouses to voice. On biar hugger.  Heart: RRR; no rub Lungs: scattered crackles diffuses  Abdomen: distended, non-tender Extremities: 2+ edema  Dialysis Access: right IJ temp cath placed 10/18   08/19/2019,7:29 AM  LOS: 8 days

## 2019-08-19 NOTE — Progress Notes (Signed)
Physical Therapy Treatment Patient Details Name: Edward Rocha. MRN: [id number] DOB: [birth year] Today's Date: 08/19/2019    History of Present Illness Pt is a 53 y/o male s/p L common femoral to posterior tibial composite bypass with PTFE and saphenous vein. Noted R UE edema, imaging negative for DVT/SVT. PMH :ARF, DM 2, HTN, PVD, thrombocytopenia.     PT Comments    Pt lethargic during session, moaning in pain with all mobility. Pt a limited participant in therapeutic exercise, demonstrating minimal activation of LE muscles with some DF and hip adduction noted but non-functional. Pt with more significant use of UEs with 2/5 grip strength shoulder and elbow flexion bilaterally. Pt requiring totalA to roll to offload buttocks. Pt will continue to benefit from PT POC to improve strength and mobility and reduce caregiver burden.  Follow Up Recommendations  SNF     Equipment Recommendations  Rolling walker with 5" wheels;3in1 (PT)    Recommendations for Other Services       Precautions / Restrictions Precautions Precautions: Fall Precaution Comments: monitor O2 Restrictions Weight Bearing Restrictions: No    Mobility  Bed Mobility Overal bed mobility: Needs Assistance Bed Mobility: Rolling Rolling: Total assist;+2 for physical assistance            Transfers                    Ambulation/Gait                 Stairs             Wheelchair Mobility    Modified Rankin (Stroke Patients Only)       Balance                                            Cognition Arousal/Alertness: Lethargic Behavior During Therapy: Flat affect Overall Cognitive Status: Difficult to assess(2/2 lethargy and pt with minimal verbal responses)                                        Exercises Other Exercises Other Exercises: PROM ankle PF/DF 10 reps bilaterally Other Exercises: PROM knee flexion/extension 10 reps Other  Exercises: PROM hip flexion/extension 10 reps Other Exercises: AAROM hip abduction/adduction 10 reps Other Exercises: Shoulder flexion/elbow flexion 10 reps    General Comments        Pertinent Vitals/Pain Pain Assessment: Faces Faces Pain Scale: Hurts whole lot Pain Location: whole body Pain Descriptors / Indicators: Moaning Pain Intervention(s): Limited activity within patient's tolerance    Home Living                      Prior Function            PT Goals (current goals can now be found in the care plan section) Acute Rehab PT Goals Patient Stated Goal: to get home and have less pain  Progress towards PT goals: Not progressing toward goals - comment    Frequency    Min 3X/week      PT Plan Discharge plan needs to be updated    Co-evaluation              AM-PAC PT "6 Clicks" Mobility   Outcome Measure  Help needed turning from your  back to your side while in a flat bed without using bedrails?: Total Help needed moving from lying on your back to sitting on the side of a flat bed without using bedrails?: Total Help needed moving to and from a bed to a chair (including a wheelchair)?: Total Help needed standing up from a chair using your arms (e.g., wheelchair or bedside chair)?: Total Help needed to walk in hospital room?: Total Help needed climbing 3-5 steps with a railing? : Total 6 Click Score: 6    End of Session   Activity Tolerance: Patient limited by pain Patient left: in bed;with call bell/phone within reach;with bed alarm set Nurse Communication: Mobility status PT Visit Diagnosis: Unsteadiness on feet (R26.81) Pain - Right/Left: Left Pain - part of body: Leg     Time: [id number] PT Time Calculation (min) (ACUTE ONLY): 30 min  Charges:  $Therapeutic Exercise: 23-37 mins                     Zenaida Niece, PT, DPT Acute Rehabilitation Pager: [phone]    Zenaida Niece 08/19/2019, 3:08 PM

## 2019-08-20 ENCOUNTER — Inpatient Hospital Stay (HOSPITAL_COMMUNITY): Payer: Managed Care, Other (non HMO)

## 2019-08-20 DIAGNOSIS — G9340 Encephalopathy, unspecified: Secondary | ICD-10-CM

## 2019-08-20 LAB — RENAL FUNCTION PANEL
Albumin: 1.9 g/dL — ABNORMAL LOW (ref 3.5–5.0)
Albumin: 2 g/dL — ABNORMAL LOW (ref 3.5–5.0)
Anion gap: 10 (ref 5–15)
Anion gap: 12 (ref 5–15)
BUN: 36 mg/dL — ABNORMAL HIGH (ref 6–20)
BUN: 34 mg/dL — ABNORMAL HIGH (ref 6–20)
CO2: 24 mmol/L (ref 22–32)
CO2: 23 mmol/L (ref 22–32)
Calcium: 8.1 mg/dL — ABNORMAL LOW (ref 8.9–10.3)
Calcium: 8.2 mg/dL — ABNORMAL LOW (ref 8.9–10.3)
Chloride: 101 mmol/L (ref 98–111)
Chloride: 101 mmol/L (ref 98–111)
Creatinine, Ser: 2.01 mg/dL — ABNORMAL HIGH (ref 0.61–1.24)
Creatinine, Ser: 2.04 mg/dL — ABNORMAL HIGH (ref 0.61–1.24)
GFR calc Af Amer: 42 mL/min — ABNORMAL LOW (ref >60)
GFR calc Af Amer: 41 mL/min — ABNORMAL LOW (ref >60)
GFR calc non Af Amer: 36 mL/min — ABNORMAL LOW (ref >60)
GFR calc non Af Amer: 36 mL/min — ABNORMAL LOW (ref >60)
Glucose, Bld: 98 mg/dL (ref 70–99)
Glucose, Bld: 122 mg/dL — ABNORMAL HIGH (ref 70–99)
Phosphorus: 2.1 mg/dL — ABNORMAL LOW (ref 2.5–4.6)
Phosphorus: 2.6 mg/dL (ref 2.5–4.6)
Potassium: 3.7 mmol/L (ref 3.5–5.1)
Potassium: 4 mmol/L (ref 3.5–5.1)
Sodium: 135 mmol/L (ref 135–145)
Sodium: 136 mmol/L (ref 135–145)

## 2019-08-20 LAB — CBC
HCT: 27 % — ABNORMAL LOW (ref 39.0–52.0)
Hemoglobin: 9 g/dL — ABNORMAL LOW (ref 13.0–17.0)
MCH: 28.4 pg (ref 26.0–34.0)
MCHC: 33.3 g/dL (ref 30.0–36.0)
MCV: 85.2 fL (ref 80.0–100.0)
Platelets: 88 10*3/uL — ABNORMAL LOW (ref 150–400)
RBC: 3.17 MIL/uL — ABNORMAL LOW (ref 4.22–5.81)
RDW: 14.1 % (ref 11.5–15.5)
WBC: 15.7 10*3/uL — ABNORMAL HIGH (ref 4.0–10.5)
nRBC: 0.6 % — ABNORMAL HIGH (ref 0.0–0.2)

## 2019-08-20 LAB — GLUCOSE, CAPILLARY
Glucose-Capillary: 83 mg/dL (ref 70–99)
Glucose-Capillary: 88 mg/dL (ref 70–99)
Glucose-Capillary: 137 mg/dL — ABNORMAL HIGH (ref 70–99)
Glucose-Capillary: 141 mg/dL — ABNORMAL HIGH (ref 70–99)

## 2019-08-20 LAB — MAGNESIUM: Magnesium: 2.8 mg/dL — ABNORMAL HIGH (ref 1.7–2.4)

## 2019-08-20 LAB — PROCALCITONIN: Procalcitonin: 1 ng/mL

## 2019-08-20 LAB — APTT: aPTT: 63 seconds — ABNORMAL HIGH (ref 24–36)

## 2019-08-20 MED ORDER — SODIUM CHLORIDE 0.9 % IV SOLN
2.0000 g | INTRAVENOUS | Status: AC
  Administered 2019-08-20 – 2019-08-22 (×3): 2 g via INTRAVENOUS
  Filled 2019-08-20 (×3): qty 2

## 2019-08-20 MED ORDER — SODIUM PHOSPHATES 45 MMOLE/15ML IV SOLN
10.0000 mmol | Freq: Once | INTRAVENOUS | Status: AC
  Administered 2019-08-20: 10 mmol via INTRAVENOUS
  Filled 2019-08-20: qty 3.33

## 2019-08-20 MED ORDER — CHLORHEXIDINE GLUCONATE 0.12 % MT SOLN
15.0000 mL | Freq: Two times a day (BID) | OROMUCOSAL | Status: DC
  Administered 2019-08-20 – 2019-08-22 (×5): 15 mL via OROMUCOSAL
  Filled 2019-08-20 (×3): qty 15

## 2019-08-20 MED ORDER — PRO-STAT SUGAR FREE PO LIQD
30.0000 mL | Freq: Two times a day (BID) | ORAL | Status: DC
  Administered 2019-08-20 – 2019-08-22 (×5): 30 mL
  Filled 2019-08-20 (×5): qty 30

## 2019-08-20 MED ORDER — OSMOLITE 1.5 CAL PO LIQD
1000.0000 mL | ORAL | Status: DC
  Administered 2019-08-20 – 2019-08-21 (×2): 1000 mL
  Filled 2019-08-20 (×5): qty 1000

## 2019-08-20 MED ORDER — ORAL CARE MOUTH RINSE
15.0000 mL | Freq: Two times a day (BID) | OROMUCOSAL | Status: DC
  Administered 2019-08-20 – 2019-08-22 (×5): 15 mL via OROMUCOSAL

## 2019-08-20 MED ORDER — ACETAMINOPHEN 10 MG/ML IV SOLN
1000.0000 mg | Freq: Four times a day (QID) | INTRAVENOUS | Status: AC | PRN
  Administered 2019-08-20 – 2019-08-21 (×2): 1000 mg via INTRAVENOUS
  Filled 2019-08-20 (×2): qty 100

## 2019-08-20 NOTE — Progress Notes (Signed)
Occupational Therapy Treatment Patient Details Name: Edward Rocha. MRN: [id number] DOB: 1966-09-08 Today's Date: 08/20/2019    History of present illness Pt is a 53 y/o male with PMH of ARF, DM 2, HTN, PVD, thrombocytopenia. S/p L common femoral to posterior tibial composite bypass with PTFE and saphenous vein. Noted R UE edema, imaging negative for DVT/SVT. PMH :ARF, DM 2, HTN, PVD, thrombocytopenia.  Transferred to ICU [id number], complication of oliguric renal failure progressing to anuric renal failure with fluid overload.  Started on CRRT 10/18.    OT comments  Patient supine in bed and agreeable to PT/OT session.  Patient currently requires max assist +2 for bed mobility and transfers (lateral scoot, as unable to complete full stand to maintain squat position for pivot today).  Patient with limited verbalizations during session, cognitively requires increased time to follow 1 step commands inconsistently.  Limited by pain and weakness.  RN present throughout session to manage CRRT lines.  Will follow acutely.  DC plan updated to CIR.    Follow Up Recommendations  CIR;Supervision/Assistance - 24 hour    Equipment Recommendations  3 in 1 bedside commode    Recommendations for Other Services Rehab consult    Precautions / Restrictions Precautions Precautions: Fall Restrictions Weight Bearing Restrictions: No       Mobility Bed Mobility Overal bed mobility: Needs Assistance Bed Mobility: Supine to Sit     Supine to sit: Max assist;+2 for physical assistance     General bed mobility comments: patient able to assist movement of LEs and trunk after initation, but overall requires max assist +2 to reach EOB (RN managing CRRT lines)  Transfers Overall transfer level: Needs assistance Equipment used: 2 person hand held assist Transfers: Sit to/from Stand;Lateral/Scoot Transfers Sit to Stand: Max assist;+2 physical assistance;+2 safety/equipment        Lateral/Scoot Transfers:  Max assist;Mod assist;+2 safety/equipment;+2 physical assistance General transfer comment: sit to stand x 2 trials at EOB, max assist +2 provided and unable to reach full stand; lateral scoot towards L side with max assist +2 to intiate but once in chair able to scoot towards L side with min-mod assist.     Balance Overall balance assessment: Needs assistance Sitting-balance support: Feet supported;Bilateral upper extremity supported;Single extremity supported Sitting balance-Leahy Scale: Poor Sitting balance - Comments: min to mod assist at EOB, posterior lean at times    Standing balance support: Bilateral upper extremity supported;During functional activity Standing balance-Leahy Scale: Zero Standing balance comment: unable to reach full stand today                           ADL either performed or assessed with clinical judgement   ADL Overall ADL's : Needs assistance/impaired                         Toilet Transfer: Maximal assistance;+2 for physical assistance Toilet Transfer Details (indicate cue type and reason): lateral scoot simulated to recliner          Functional mobility during ADLs: Maximal assistance;+2 for physical assistance General ADL Comments: pt limited by pain, impaired balance, generalized weakness, decreaed activity tolerance     Vision       Perception     Praxis      Cognition Arousal/Alertness: Lethargic Behavior During Therapy: Flat affect Overall Cognitive Status: Difficult to assess Area of Impairment: Problem solving;Awareness;Orientation;Attention;Memory;Following commands;Safety/judgement  Orientation Level: Disoriented to;Time Current Attention Level: Focused Memory: Decreased short-term memory Following Commands: Follows one step commands inconsistently;Follows one step commands with increased time Safety/Judgement: Decreased awareness of safety;Decreased awareness of deficits Awareness:  Emergent;Intellectual Problem Solving: Slow processing;Decreased initiation;Difficulty sequencing;Requires verbal cues;Requires tactile cues General Comments: pt with slow processing, limited responses and following minimal commands         Exercises     Shoulder Instructions       General Comments pt on RA, VSS throughout session; RN present and managing CRRT throughout     Pertinent Vitals/ Pain       Pain Assessment: Faces Faces Pain Scale: Hurts whole lot Pain Location: generalized  Pain Descriptors / Indicators: Discomfort;Moaning Pain Intervention(s): Limited activity within patient's tolerance;Monitored during session;Repositioned  Home Living                                          Prior Functioning/Environment              Frequency  Min 2X/week        Progress Toward Goals  OT Goals(current goals can now be found in the care plan section)  Progress towards OT goals: Not progressing toward goals - comment(change in medical status)  Acute Rehab OT Goals Patient Stated Goal: less pain  OT Goal Formulation: With patient  Plan Discharge plan needs to be updated;Frequency remains appropriate    Co-evaluation    PT/OT/SLP Co-Evaluation/Treatment: Yes Reason for Co-Treatment: Complexity of the patient's impairments (multi-system involvement)   OT goals addressed during session: ADL's and self-care;Strengthening/ROM      AM-PAC OT "6 Clicks" Daily Activity     Outcome Measure   Help from another person eating meals?: Total Help from another person taking care of personal grooming?: A Lot Help from another person toileting, which includes using toliet, bedpan, or urinal?: Total Help from another person bathing (including washing, rinsing, drying)?: A Lot Help from another person to put on and taking off regular upper body clothing?: A Lot Help from another person to put on and taking off regular lower body clothing?: Total 6 Click  Score: 9    End of Session    OT Visit Diagnosis: Other abnormalities of gait and mobility (R26.89);Pain;Other symptoms and signs involving cognitive function;Muscle weakness (generalized) (M62.81) Pain - Right/Left: Left Pain - part of body: Leg   Activity Tolerance Patient limited by pain;Patient limited by lethargy   Patient Left in chair;with call bell/phone within reach;with chair alarm set;with nursing/sitter in room   Nurse Communication Mobility status        Time: [id number] OT Time Calculation (min): 35 min  Charges: OT General Charges $OT Visit: 1 Visit OT Treatments $Self Care/Home Management : 8-22 mins  Delight Stare, [place] Pager [phone] Office [phone]    Delight Stare 08/20/2019, 1:40 PM

## 2019-08-20 NOTE — Progress Notes (Signed)
[facility]  Subjective  - Off oxygen with CRRT.  Complaining of pain all over body.  Objective 134/80 96 (!) 97.4 F (36.3 C) (!) 23 96%  Intake/Output Summary (Last 24 hours) at 08/20/2019 0930 Last data filed at 08/20/2019 0900 Gross per 24 hour  Intake 287.35 ml  Output 5997 ml  Net -5709.65 ml    Left PT brisk signal Left groin incision c/d/i Left leg incisions c/d/i Volume overload improving Room air  Laboratory Lab Results: Recent Labs    08/19/19 0721 08/20/19 0354  WBC 13.7* 15.7*  HGB 8.7* 9.0*  HCT 26.9* 27.0*  PLT 86* 88*   BMET Recent Labs    08/19/19 1614 08/20/19 0354  NA 134* 135  K 3.9 3.7  CL 99 101  CO2 24 24  GLUCOSE 115* 98  BUN 42* 36*  CREATININE 1.96* 2.01*  CALCIUM 8.1* 8.1*    COAG Lab Results  Component Value Date   INR 1.2 08/07/2019   INR 1.3 (H) 05/27/2019   No results found for: PTT  Assessment/Planning:  Postop day 9 status post left common femoral to PT bypass with composite PTFE and GSV. Continues to have a very brisk posterior tibial signal in the left foot.   AKI post-op now progressing to oliguric renal failure.  Tried aggressive diuresis with high dose lasix and no improvement with worsening respiratory status.  Started on CRRT three days ago.  Volume status improving.  Off oxygen.  Plan to stop CRRT today and monitor.  Hyponatremia improved - from volume overload.  Discussed with nephrology this am - hopeful for renal recovery.  Hgb 9, ABLA.  No indication for transfusion.  Has chronic thrombocytopenia that was worked up by hematology prior to surgery and previously had trial of steroids with no improvement. Platelets are 88, slowly improving.  Also on cefepime and being treated for pneumonia.  Swallow evaluation today then advance diet.  Bowel regimen.  OOB to chair after stopping CRRT today.  [name] 08/20/2019 9:30 AM --

## 2019-08-20 NOTE — Progress Notes (Signed)
CRRT stopped at approximately 1530. Blood returned to patient. HD cath heparin locked.

## 2019-08-20 NOTE — Progress Notes (Signed)
eLink Physician-Brief Progress Note Patient Name: Edward Rocha. DOB: [birth year] MRN: [id number]   Date of Service  08/20/2019  HPI/Events of Note  Patient with sub-optimal pain control on prn Fentanyl and standard dose prn oral Tylenol and oxycodone.  eICU Interventions  Oral Tylenol discontinued and iv Acetaminophen I gm Q 6 hours prn pain x 4 doses ordered. Continue prn iv Fentanyl and oral Oxycodone. NSAIDS contraindicated due to AKI.        [name] 08/20/2019, 1:34 AM

## 2019-08-20 NOTE — Progress Notes (Addendum)
Subjective:  Patient had uncontrolled pain overnight and was given IV acetaminophen. He is now oxygenating well on room air. This morning he complains of pain but unable to specifically say where. Answers orientation questions appropriately. Total of 10 liters off with CRRT - oxygenation status and swelling improved  Objective Vital signs in last 24 hours: Vitals:   08/20/19 0400 08/20/19 0500 08/20/19 0600 08/20/19 0700  BP: 126/63 128/64 (!) 153/133 119/84  Pulse: 97 89 98 94  Resp: (!) 29 12 (!) 37 20  Temp: 97.7 F (36.5 C)     TempSrc: Axillary     SpO2: 97% 98% 97% 98%  Weight:  90.5 kg    Height:       Weight change: -3.6 kg  Intake/Output Summary (Last 24 hours) at 08/20/2019 [id number] Last data filed at 08/20/2019 0700 Gross per 24 hour  Intake 287.35 ml  Output 5985 ml  Net -5697.65 ml    Assessment/ Plan: Pt is a 53 y.o. yo male who was admitted on 08/11/2019 for left femoral to PTA bypass graft placement and subsequently developed oliguric renal failure requiring CRRT.   Assessment/Plan: 1. AKI (baseline crt ~1) - multifactorial in the setting of ischemic ATN with ABLA and relative hypotension, contrast nephropathy and BOO. Ultimately required initiation of CRRT on 10/18 due to profound volume overload after failing high doses of Lasix and metolazone. Labs and volume status continue to improve as a result of CRRT.  - he has had total of 10L removed with significant improvement in volume status; will plan to discontinue CRRT at the end of today and monitor for return of renal function. Given that he had normal renal function prior to procedure, hopeful for renal function recovering without need for long-term HD. 2. Acute hypoxic respiratory failure - volume overload vs multifocal pneumonia. Is on empiric coverage with cefepime x7 days. Procal trending down. Improved with volume removal and antibiotic therapy and is now oxygenating on room air.  3. ABLA - received total of 3  units since admission. Hgb stable at 9.0 Continue ESA and transfuse as needed.  4. Hyponatremia due to hypervolemia - resolved 5. HTN - BP stable; holdings meds; will monitor off CRRT  6. Thrombocytopenia - chronic and worked up by hematology without etiology. HIT panel negative. On heparin.   7. DM type 2 - poorly controlled. Holding Invokana due to AKI. SSI as needed  8. Elytes - K stable on 4 k bath; corrected calcium ok; will replete phos 9. Metabolic acidosis - due to acute renal failure; resolved with CRRT   Delice Bison    Labs: Basic Metabolic Panel: Recent Labs  Lab 08/19/19 0721 08/19/19 1614 08/20/19 0354  NA 133* 134* 135  K 4.4 3.9 3.7  CL 97* 99 101  CO2 24 24 24   GLUCOSE 140* 115* 98  BUN 48* 42* 36*  CREATININE 2.22* 1.96* 2.01*  CALCIUM 7.9* 8.1* 8.1*  PHOS 4.0 2.4* 2.1*   Liver Function Tests: Recent Labs  Lab 08/19/19 0721 08/19/19 1614 08/20/19 0354  ALBUMIN 1.9* 2.0* 1.9*   No results for input(s): LIPASE, AMYLASE in the last 168 hours. No results for input(s): AMMONIA in the last 168 hours. CBC: Recent Labs  Lab 08/16/19 0228 08/17/19 0224  08/18/19 0333 08/19/19 0721 08/20/19 0354  WBC 13.7* 12.7*  --  13.1* 13.7* 15.7*  HGB 8.0* 7.4*   < > 8.1* 8.7* 9.0*  HCT 22.8* 21.8*   < > 23.9* 26.9* 27.0*  MCV 82.3 82.9  --  83.3 86.2 85.2  PLT 76* 75*  --  77* 86* 88*   < > = values in this interval not displayed.   Cardiac Enzymes: No results for input(s): CKTOTAL, CKMB, CKMBINDEX, TROPONINI in the last 168 hours. CBG: Recent Labs  Lab 08/18/19 1530 08/18/19 2154 08/19/19 1110 08/19/19 1533 08/19/19 2200  GLUCAP 129* 112* 137* 114* 118*    Iron Studies: No results for input(s): IRON, TIBC, TRANSFERRIN, FERRITIN in the last 72 hours. Studies/Results: Dg Chest Port 1 View  Result Date: 08/19/2019 CLINICAL DATA:  Acute respiratory failure. EXAM: PORTABLE CHEST 1 VIEW COMPARISON:  08/17/2019. FINDINGS: Right IJ line noted in  stable position. Stable cardiomegaly. Diffuse severe bilateral pulmonary infiltrates/edema again noted. Slight interim improvement. No pleural effusion or pneumothorax. IMPRESSION: 1.  Right IJ line in stable position. 2.  Stable cardiomegaly. 3. Diffuse severe bilateral pulmonary infiltrates/edema again noted. Slight interim improvement. Electronically Signed   By: Maynard   On: 08/19/2019 06:46   Medications: Infusions: .  prismasol BGK 4/2.5 400 mL/hr at 08/20/19 0254  .  prismasol BGK 4/2.5 200 mL/hr at 08/20/19 0351  . sodium chloride Stopped (08/16/19 [id number])  . sodium chloride    . acetaminophen 1,000 mg (08/20/19 0338)  . ceFEPime (MAXIPIME) IV 2 g (08/19/19 2155)  . magnesium sulfate bolus IVPB    . prismasol BGK 4/2.5 1,800 mL/hr at 08/20/19 [id number]    Scheduled Medications: . sodium chloride   Intravenous Once  . aspirin EC  81 mg Oral Daily  . atorvastatin  80 mg Oral Daily  . chlorhexidine  15 mL Mouth Rinse BID  . Chlorhexidine Gluconate Cloth  6 each Topical Daily  . clopidogrel  75 mg Oral Daily  . darbepoetin (ARANESP) injection - NON-DIALYSIS  150 mcg Subcutaneous Q Mon-1800  . docusate sodium  100 mg Oral Daily  . feeding supplement (ENSURE ENLIVE)  237 mL Oral TID BM  . heparin injection (subcutaneous)  5,000 Units Subcutaneous Q8H  . insulin aspart  0-9 Units Subcutaneous TID WC  . insulin glargine  20 Units Subcutaneous QPM  . mouth rinse  15 mL Mouth Rinse q12n4p  . pantoprazole  40 mg Oral Daily  . sodium chloride flush  3 mL Intravenous Q12H    have reviewed scheduled and prn medications.  Physical Exam: General: awake, appears uncomfortable, moans throughout exam  Heart: RRR Lungs: tachypneic likely 2/2 pain, lungs sound improved compared to yesterday  Abdomen: mildly distended, non-tender Extremities: 2+ edema in LLE, trace in RLE Dialysis Access: right IJ temp cath placed 10/18   08/20/2019,7:28 AM  LOS: 9 days    Patient seen and examined,  agree with above note with above modifications. Patient uncomfortable - requiring much pain meds for unclear reasons.  Regarding CRRT- have reomved total of 10 liters of fluid with excellent results.  Plan is to stop CRRT when convenient today, then watch for return of renal function.  Baseline crt good so I think has a good chance for recovery.  Leave foley in for now given question of BOO.  Will give one dose of phos, other elytes are OK  Corliss Parish, MD 08/20/2019

## 2019-08-20 NOTE — Evaluation (Signed)
Clinical/Bedside Swallow Evaluation Patient Details  Name: Edward Rocha. MRN: [id number] Date of Birth: 01-Apr-1966  Today's Date: 08/20/2019 Time: SLP Start Time (ACUTE ONLY): 1016 SLP Stop Time (ACUTE ONLY): 1030 SLP Time Calculation (min) (ACUTE ONLY): 14 min  Past Medical History:  Past Medical History:  Diagnosis Date  . Acute renal failure (ARF) ([place])    12/2018 when admitted for hyperosmolar hyperglycemic nonketotic syndrome  . Bilateral pain of leg and foot   . Diabetes mellitus without complication ([place])    Type II  . Dizziness   . Elevated LFTs 05/2019  . Frequent urination   . Hyperglycemia    Non-Ketotic Hyperosmolar.  . Hyperlipidemia   . Hypernatremia   . Hypertension   . Peripheral vascular disease ([place])   . Syncope    12/2018, diagnosed with hyperosmolar hyperglycemic nonketotic syndrome, AKI, hyponatremia ([facility])  . Thrombocytopenia ([place])   . Thyroid disease    Hypothyroidism  . Weakness    Past Surgical History:  Past Surgical History:  Procedure Laterality Date  . ABDOMINAL AORTOGRAM W/LOWER EXTREMITY N/A 02/06/2019   Procedure: ABDOMINAL AORTOGRAM W/LOWER EXTREMITY;  Surgeon: [name], MD;  Location: [facility];  Service: Cardiovascular;  Laterality: N/A;  bilateral  . ABDOMINAL AORTOGRAM W/LOWER EXTREMITY N/A 04/30/2019   Procedure: ABDOMINAL AORTOGRAM W/LOWER EXTREMITY;  Surgeon: [name], MD;  Location: [place];  Service: Cardiovascular;  Laterality: N/A;  . AORTOGRAM  08/11/2019   Procedure: Aortogram;  Surgeon: [name], MD;  Location: [place];  Service: Vascular;;  . FEMORAL-POPLITEAL BYPASS GRAFT Left 08/11/2019   Procedure: BYPASS LEFT FEMORAL-DISTAL POPLITEAL ARTERY USING PROPATEN GRAFT;  Surgeon: [name], MD;  Location: [facility];  Service: Vascular;  Laterality: Left;  Marland Kitchen MULTIPLE TOOTH EXTRACTIONS    . PERIPHERAL VASCULAR INTERVENTION  02/06/2019   Procedure: PERIPHERAL  VASCULAR INTERVENTION;  Surgeon: [name], MD;  Location: [facility];  Service: Cardiovascular;;  Bilateral Iliacs  . PERIPHERAL VASCULAR INTERVENTION  04/30/2019   Procedure: PERIPHERAL VASCULAR INTERVENTION;  Surgeon: [name], MD;  Location: [facility];  Service: Cardiovascular;;  Stent - Lt. Iliac   . VASCULAR SURGERY     HPI:  Pt is a 53 yo who presented on 10/12 for L common femoral to posterior tibial composite bypass with PTFE and saphenous vein. Hospital course was complicated by AKI with CRRT started 10/18, acute respiratory failure, and AMS. PMH includes: DM, HTN, HLD, thrombocytpenia, and PAD s/p bilateral common iliac stenting on 4/9   Assessment / Plan / Recommendation Clinical Impression  Pt's mentation is likely his biggest barrier to safe PO intake at this time. He is alert but confused and perseverative, not following commands consistently. He does not have good bolus awareness and does not try to orally manipulate ice chips at all. There is no swallow response with ice chips, but he does initiate a swallow with thin liquids and purees. Suspect that thin liquids spill somewhat passively into his pharynx and coughing is immediate. He has no overt s/s of aspiration with purees, but Mod-Max cues are needed to clear his oral cavity and sustain attention to full bolus transit. This could allow for medication administration with crushed pills, but would not be realistic for a meal. Recommend that he stay NPO except for meds. SLP will f/u for potential to resume additional POs pending improvements in mentation. SLP Visit Diagnosis: Dysphagia, oropharyngeal phase (R13.12)    Aspiration  Risk  Severe aspiration risk    Diet Recommendation NPO except meds   Medication Administration: Crushed with puree    Other  Recommendations Oral Care Recommendations: Oral care QID Other Recommendations: Have oral suction available   Follow up Recommendations  Skilled Nursing facility      Frequency and Duration min 2x/week  2 weeks       Prognosis Prognosis for Safe Diet Advancement: Good Barriers to Reach Goals: Cognitive deficits      Swallow Study   General HPI: Pt is a 53 yo who presented on 10/12 for L common femoral to posterior tibial composite bypass with PTFE and saphenous vein. Hospital course was complicated by AKI with CRRT started 10/18, acute respiratory failure, and AMS. PMH includes: DM, HTN, HLD, thrombocytpenia, and PAD s/p bilateral common iliac stenting on 4/9 Type of Study: Bedside Swallow Evaluation Previous Swallow Assessment: none in chart Diet Prior to this Study: NPO Temperature Spikes Noted: No Respiratory Status: Room air History of Recent Intubation: (for procedure only on 10/12) Behavior/Cognition: Alert;Confused;Requires cueing Oral Cavity Assessment: Dry Oral Care Completed by SLP: Recent completion by staff Oral Cavity - Dentition: Edentulous Self-Feeding Abilities: Total assist Patient Positioning: Upright in bed Baseline Vocal Quality: Normal Volitional Swallow: Able to elicit    Oral/Motor/Sensory Function Overall Oral Motor/Sensory Function: (difficulty following commands but appears symmetrical)   Ice Chips Ice chips: Impaired Presentation: Spoon Oral Phase Impairments: Poor awareness of bolus;Reduced lingual movement/coordination;Reduced labial seal Pharyngeal Phase Impairments: Unable to trigger swallow   Thin Liquid Thin Liquid: Impaired Presentation: Spoon;Straw Oral Phase Impairments: Reduced labial seal;Reduced lingual movement/coordination;Poor awareness of bolus Pharyngeal  Phase Impairments: Cough - Immediate    Nectar Thick Nectar Thick Liquid: Not tested   Honey Thick Honey Thick Liquid: Not tested   Puree Puree: Impaired Presentation: Spoon Oral Phase Impairments: Reduced labial seal;Reduced lingual movement/coordination;Poor awareness of bolus Oral Phase Functional  Implications: Oral residue Pharyngeal Phase Impairments: Suspected delayed Swallow   Solid     Solid: Not tested      [name] Jaleen Finch 08/20/2019,12:54 PM  Pollyann Glen, M.A. [place] Acute Environmental education officer [phone] Office [phone]

## 2019-08-20 NOTE — Progress Notes (Signed)
Nutrition Follow-up  DOCUMENTATION CODES:   Not applicable  INTERVENTION:   -Monitor for BM (day 9 without) -Monitor magnesium, potassium, and phosphorus daily for at least 3 days, MD to replete as needed, as pt is at risk for refeeding syndrome  Once NGT placed:  -Osmolite 1.5 @ 20 ml/hr via NGT -Increase by 10 ml Q4 hours to goal rate of 60 ml/hr (1440 ml) -30 ml Prostat BID  At goal TF provides: 2360 kcals, 120 grams protein, 1097 ml free water.   NUTRITION DIAGNOSIS:   Increased nutrient needs related to acute illness(AKI requiring CRRT) as evidenced by estimated needs.  Ongoing  GOAL:   Patient will meet greater than or equal to 90% of their needs   Not meeting  MONITOR:   Diet advancement, Supplement acceptance, PO intake, Labs, Weight trends, I & O's, Skin  REASON FOR ASSESSMENT:   Rounds    ASSESSMENT:   Patient with PMH significant for HTN, HLD, DM, and PAD s/p bilateral common iliac stenting on 4/9 . Presents this admission with continued leg leg pain.   10/12- s/p L femoral PTA bypass 10/18- R IJ- start CRRT  Pt discussed during ICU rounds and with RN.   Plan to d/c CRRT today- monitor need for iHD. Mental status remains barrier to safe swallowing. Per RN, takes apple sauce okay but not much else. SLP to keep pt NPO. Discussed short term feeding with CCM as pt is day 4 without nutrition. RN to place NGT at bedside.Cortrak available Friday to exchange for smaller tube if pt unable to pass future swallow evaluation.   Admission weight: 98.4 kg  Current weight: 90.5 kg   I/O: -6,437 ml since admit  UOP: 60 ml x 24 hrs  CRRT: 5,985 ml x 24 hrs   Medications: aranesp, colace, SS novolog, lantus, sodium phosphate Labs: Cr 2.01- up slightly from yesterday Phosphorus 2.1 (L) Mg 2.8 (H) Corrected calcium 9.8 (wdl) CBG 83-137  Diet Order:   Diet Order    None      EDUCATION NEEDS:   Not appropriate for education at this time  Skin:  Skin  Assessment: Skin Integrity Issues: Skin Integrity Issues:: Incisions Incisions: L leg  Last BM:  10/12  Height:   Ht Readings from Last 1 Encounters:  08/11/19 6\' 1"  (1.854 m)    Weight:   Wt Readings from Last 1 Encounters:  08/20/19 90.5 kg    Ideal Body Weight:  83.6 kg  BMI:  Body mass index is 26.32 kg/m.  Estimated Nutritional Needs:   Kcal:  2300-2500 kcal  Protein:  115-130 grams  Fluid:  >/= 2 L/day  Mariana Single RD, LDN Clinical Nutrition Pager # - [phone]

## 2019-08-20 NOTE — Progress Notes (Signed)
Mariana Single RD stated that she spoke with Chase Caller MD and was ordered to insert NGT to start tube feeds. Tube placed. Abdominal x-Joynt ordered.

## 2019-08-20 NOTE — Progress Notes (Signed)
NAME:  Edward Derring., MRN:  [id number], DOB:  May 29, 1966, LOS: 9 ADMISSION DATE:  08/11/2019, CONSULTATION DATE:  08/17/19, CHIEF COMPLAINT:  SOB  Brief History    53 yo with history of DM, HTN, HLD, thrombocytopenia, and PAD s/p aortogram with left iliac arteriogram prior to left femoral to PTA bypass graft placement on 08/11/19.  Developed oliguric renal failure, CCM consulted 10/18 due to worsening hypoxia and fluid overload   Past Medical History  DM HTN HLD Thrombocytopenia PAD AKI  Significant Hospital Events   PTA on 08/11/19  08/19/2019 - Remains critically ill on CRRT Placed on CPAP this morning after he was somnolent after Dilaudid given at 4 AM Remains hypoxic on high flow oxygen and now blended to CPAP   Consults:  PCCM Nephro Surgery  Procedures:  RIJ HD cath 10/18 >>  Significant Diagnostic Tests:  10/14  renal ultrasound >> no hnosis  Micro Data:  Covid negative.  Antimicrobials:  10/16  Cefepime >> 10/18 BC >>ng  Interim history/subjective:    08/20/2019 - off o2.  CRRT + - 10 L removed per renal with improvement in hypoxemia and anasarca. Renal will stop CRRT today.  Generalzize dpain + - severe uncontrollable.   Objective   Blood pressure 138/72, pulse (!) 105, temperature 97.6 F (36.4 C), temperature source Axillary, resp. rate 17, height 6\' 1"  (1.854 m), weight 90.5 kg, SpO2 97 %.        Intake/Output Summary (Last 24 hours) at 08/20/2019 1222 Last data filed at 08/20/2019 1200 Gross per 24 hour  Intake 458.78 ml  Output 5971 ml  Net -5512.22 ml   Filed Weights   08/18/19 0500 08/19/19 0500 08/20/19 0500  Weight: 102.6 kg 94.1 kg 90.5 kg   General Appearance:  Looks deconditioned but stable. Sitting in chair side of bed. Moans. On CRRT Head:  Normocephalic, without obvious abnormality, atraumatic Eyes:  PERRL - yes, conjunctiva/corneas - muddu     Ears:  Normal external ear canals, both ears Nose:  G tube - no Throat:  ETT  TUBE - no , OG tube - no Neck:  Supple,  No enlargement/tenderness/nodules Lungs: Clear to auscultation bilaterally Heart:  S1 and S2 normal, no murmur, CVP - no.  Pressors - no Abdomen:  Soft, no masses, no organomegaly Genitalia / Rectal:  Not done Extremities:  Extremities- intact Skin:  ntact in exposed areas . Sacral area - not examined Neurologic:  Sedation - none -> RASS - +1 . Moves all 4s - yes. CAM-ICU - unable to test . Orientation - unable to assess. Moans     Chest x-Vine 10/ 20 personally reviewed which shows bilateral multifocal patchy infiltrates?  Edema versus pneumonia -improved compared to 10/18  Labs show improving hyponatremia, decreasing creatinine, stable leukocytosis and thrombocytopenia and anemia  Resolved Hospital Problem list   NA   Assessment & Plan:  Developed AKI status post left femoral bypass with fluid overload  Acute hypoxic respiratory failure -pneumonia versus edema , procalcitonin slight high, chest x-Deem improving -Empiric cefepime x 7 days -Volume removal with CRRT - CPAP with oxygen blended during sleep  - off o2 08/20/2019   Plan  -pulmonary toilet   AKI with volume overload -favor ischemic ATN due to hypotension + contrast insult Minimal urine output, no signs of renal recovery yet -CRRT per renal, keeping -200/h -Follow electrolytes  Acute encephalopathy-resolving ,On 10/20 dc Dilaudid and use fentanyl 25-50 as needed 08/20/2019 - moans due to pain  Plan  - per VVS pain control  Thrombocytopenia appears to be chronic, no heparin per CRRT   Electrolyte imbalance  - per renal   Extreme deconditioning -needs PT    Best practice:  Diet: NPO for now Pain/Anxiety/Delirium protocol (if indicated): per primary VAP protocol (if indicated): NA DVT prophylaxis: Heparin GI prophylaxis: NA Mobility: As toelrated Code Status: Full for now Family Communication: , per vascular Disposition: ICU   Ccm will sign off   SIGNATURE    Dr. Brand Males, M.D., F.C.C.P,  Pulmonary and Critical Care Medicine Staff Physician, [place] Director - Interstitial Lung Disease  Program  Pulmonary [place] [facility] [place], [place], [address]  Pager: [phone], If no answer or between  15:00h - 7:00h: call [phone] Telephone: [phone]  12:40 PM 08/20/2019    [place]  Lab 08/17/19 2058  PHART 7.407  PCO2ART 32.2  PO2ART 66.0*  HCO3 20.2  TCO2 21*  O2SAT 93.0    CBC Recent Labs  Lab 08/18/19 0333 08/19/19 0721 08/20/19 0354  HGB 8.1* 8.7* 9.0*  HCT 23.9* 26.9* 27.0*  WBC 13.1* 13.7* 15.7*  PLT 77* 86* 88*    COAGULATION No results for input(s): INR in the last 168 hours.  CARDIAC  No results for input(s): TROPONINI in the last 168 hours. No results for input(s): PROBNP in the last 168 hours.   CHEMISTRY Recent Labs  Lab 08/18/19 0333 08/18/19 1623 08/19/19 0721 08/19/19 1614 08/20/19 0354  NA 127* 131* 133* 134* 135  K 4.2 4.2 4.4 3.9 3.7  CL 92* 96* 97* 99 101  CO2 21* 22 24 24 24   GLUCOSE 124* 120* 140* 115* 98  BUN 97* 68* 48* 42* 36*  CREATININE 4.04* 3.13* 2.22* 1.96* 2.01*  CALCIUM 7.7* 8.0* 7.9* 8.1* 8.1*  MG 3.0*  --  3.1*  --  2.8*  PHOS 6.9* 4.8* 4.0 2.4* 2.1*   Estimated Creatinine Clearance: 46.9 mL/min (A) (by C-G formula based on SCr of 2.01 mg/dL (H)).   LIVER Recent Labs  Lab 08/18/19 0333 08/18/19 1623 08/19/19 0721 08/19/19 1614 08/20/19 0354  ALBUMIN 1.9* 1.9* 1.9* 2.0* 1.9*     INFECTIOUS Recent Labs  Lab 08/18/19 0333 08/19/19 0721 08/20/19 0354  PROCALCITON 3.11 1.54 1.00     ENDOCRINE CBG (last 3)  Recent Labs    08/19/19 2200 08/20/19 0748 08/20/19 1105  GLUCAP 118* 83 88         IMAGING x48h  - image(s) personally visualized  -   highlighted in bold Dg Chest Port 1 View  Result Date: 08/19/2019 CLINICAL DATA:   Acute respiratory failure. EXAM: PORTABLE CHEST 1 VIEW COMPARISON:  08/17/2019. FINDINGS: Right IJ line noted in stable position. Stable cardiomegaly. Diffuse severe bilateral pulmonary infiltrates/edema again noted. Slight interim improvement. No pleural effusion or pneumothorax. IMPRESSION: 1.  Right IJ line in stable position. 2.  Stable cardiomegaly. 3. Diffuse severe bilateral pulmonary infiltrates/edema again noted. Slight interim improvement. Electronically Signed   By: Marcello Moores  Register   On: 08/19/2019 06:46

## 2019-08-20 NOTE — Progress Notes (Signed)
Physical Therapy Treatment Patient Details Name: Edward Rocha. MRN: [id number] DOB: 09-Nov-1965 Today's Date: 08/20/2019    History of Present Illness Pt is a 53 y/o male with PMH of ARF, DM 2, HTN, PVD, thrombocytopenia. S/p L common femoral to posterior tibial composite bypass with PTFE and saphenous vein. Noted R UE edema, imaging negative for DVT/SVT. PMH :ARF, DM 2, HTN, PVD, thrombocytopenia.  Transferred to ICU [id number], complication of oliguric renal failure progressing to anuric renal failure with fluid overload.  Started on CRRT 10/18.     PT Comments    Patient seen for mobility progression. Pt requires max A +2 for bed mobility and OOB transfer training. Pt attempted sit to stand transfer X 2 trials however unable to achieve full standing position. Continue to progress as tolerated with anticipated d/c to SNF for further skilled PT services.    Follow Up Recommendations  SNF     Equipment Recommendations  Rolling walker with 5" wheels;3in1 (PT)    Recommendations for Other Services       Precautions / Restrictions Precautions Precautions: Fall Restrictions Weight Bearing Restrictions: No    Mobility  Bed Mobility Overal bed mobility: Needs Assistance Bed Mobility: Supine to Sit     Supine to sit: Max assist;+2 for physical assistance     General bed mobility comments: patient able to assist movement of LEs and trunk after initation, but overall requires max assist +2 to reach EOB (RN managing CRRT lines)  Transfers Overall transfer level: Needs assistance Equipment used: 2 person hand held assist Transfers: Sit to/from Stand;Lateral/Scoot Transfers Sit to Stand: Max assist;+2 physical assistance;+2 safety/equipment        Lateral/Scoot Transfers: Max assist;Mod assist;+2 safety/equipment;+2 physical assistance General transfer comment: cues for hand placement and sequencing for all transfer trials; sit to stand x 2 trials at EOB, max assist +2 provided and  unable to reach full stand; lateral scoot towards L side with max assist +2 to intiate but once in chair able to scoot towards L side with min-mod assist.   Ambulation/Gait                 Stairs             Wheelchair Mobility    Modified Rankin (Stroke Patients Only)       Balance Overall balance assessment: Needs assistance Sitting-balance support: Feet supported;Bilateral upper extremity supported;Single extremity supported Sitting balance-Leahy Scale: Poor Sitting balance - Comments: min to mod assist at EOB, posterior lean at times    Standing balance support: Bilateral upper extremity supported;During functional activity Standing balance-Leahy Scale: Zero Standing balance comment: unable to reach full stand today                            Cognition Arousal/Alertness: Lethargic Behavior During Therapy: Flat affect Overall Cognitive Status: Difficult to assess Area of Impairment: Problem solving;Awareness;Orientation;Attention;Memory;Following commands;Safety/judgement                 Orientation Level: Disoriented to;Time Current Attention Level: Focused Memory: Decreased short-term memory Following Commands: Follows one step commands inconsistently;Follows one step commands with increased time Safety/Judgement: Decreased awareness of safety;Decreased awareness of deficits Awareness: Emergent;Intellectual Problem Solving: Slow processing;Decreased initiation;Difficulty sequencing;Requires verbal cues;Requires tactile cues General Comments: pt with slow processing, limited responses and following minimal commands       Exercises      General Comments General comments (skin integrity, edema, etc.): VSS on  RA; pt on CRRT and RN managing lines while transfering      Pertinent Vitals/Pain Pain Assessment: Faces Faces Pain Scale: Hurts whole lot Pain Location: generalized  Pain Descriptors / Indicators: Discomfort;Moaning Pain  Intervention(s): Limited activity within patient's tolerance;Monitored during session;Repositioned    Home Living                      Prior Function            PT Goals (current goals can now be found in the care plan section) Acute Rehab PT Goals Patient Stated Goal: less pain  Progress towards PT goals: Progressing toward goals    Frequency    Min 3X/week      PT Plan Current plan remains appropriate    Co-evaluation PT/OT/SLP Co-Evaluation/Treatment: Yes Reason for Co-Treatment: Complexity of the patient's impairments (multi-system involvement);For patient/therapist safety PT goals addressed during session: Mobility/safety with mobility OT goals addressed during session: ADL's and self-care;Strengthening/ROM      AM-PAC PT "6 Clicks" Mobility   Outcome Measure  Help needed turning from your back to your side while in a flat bed without using bedrails?: A Lot Help needed moving from lying on your back to sitting on the side of a flat bed without using bedrails?: A Lot Help needed moving to and from a bed to a chair (including a wheelchair)?: A Lot Help needed standing up from a chair using your arms (e.g., wheelchair or bedside chair)?: Total Help needed to walk in hospital room?: Total Help needed climbing 3-5 steps with a railing? : Total 6 Click Score: 9    End of Session Equipment Utilized During Treatment: Gait belt Activity Tolerance: Patient limited by fatigue;Patient limited by pain Patient left: in chair;with call bell/phone within reach Nurse Communication: Mobility status;Need for lift equipment PT Visit Diagnosis: Unsteadiness on feet (R26.81) Pain - Right/Left: Left Pain - part of body: Leg     Time: [id number] PT Time Calculation (min) (ACUTE ONLY): 35 min  Charges:  $Gait Training: 8-22 mins                     Earney Navy, PTA Acute Rehabilitation Services Pager: [phone] Office: [phone]     Darliss Cheney 08/20/2019, 4:36 PM

## 2019-08-21 LAB — CBC WITH DIFFERENTIAL/PLATELET
Abs Immature Granulocytes: 0.41 10*3/uL — ABNORMAL HIGH (ref 0.00–0.07)
Basophils Absolute: 0.1 10*3/uL (ref 0.0–0.1)
Basophils Relative: 0 %
Eosinophils Absolute: 0.5 10*3/uL (ref 0.0–0.5)
Eosinophils Relative: 3 %
HCT: 26.4 % — ABNORMAL LOW (ref 39.0–52.0)
Hemoglobin: 8.2 g/dL — ABNORMAL LOW (ref 13.0–17.0)
Immature Granulocytes: 2 %
Lymphocytes Relative: 13 %
Lymphs Abs: 2.2 10*3/uL (ref 0.7–4.0)
MCH: 27.7 pg (ref 26.0–34.0)
MCHC: 31.1 g/dL (ref 30.0–36.0)
MCV: 89.2 fL (ref 80.0–100.0)
Monocytes Absolute: 1.6 10*3/uL — ABNORMAL HIGH (ref 0.1–1.0)
Monocytes Relative: 9 %
Neutro Abs: 12.5 10*3/uL — ABNORMAL HIGH (ref 1.7–7.7)
Neutrophils Relative %: 73 %
Platelets: 79 10*3/uL — ABNORMAL LOW (ref 150–400)
RBC: 2.96 MIL/uL — ABNORMAL LOW (ref 4.22–5.81)
RDW: 14.9 % (ref 11.5–15.5)
WBC: 17.3 10*3/uL — ABNORMAL HIGH (ref 4.0–10.5)
nRBC: 0.6 % — ABNORMAL HIGH (ref 0.0–0.2)

## 2019-08-21 LAB — RENAL FUNCTION PANEL
Albumin: 1.8 g/dL — ABNORMAL LOW (ref 3.5–5.0)
Albumin: 1.9 g/dL — ABNORMAL LOW (ref 3.5–5.0)
Anion gap: 11 (ref 5–15)
Anion gap: 11 (ref 5–15)
BUN: 59 mg/dL — ABNORMAL HIGH (ref 6–20)
BUN: 72 mg/dL — ABNORMAL HIGH (ref 6–20)
CO2: 21 mmol/L — ABNORMAL LOW (ref 22–32)
CO2: 22 mmol/L (ref 22–32)
Calcium: 7.8 mg/dL — ABNORMAL LOW (ref 8.9–10.3)
Calcium: 8.1 mg/dL — ABNORMAL LOW (ref 8.9–10.3)
Chloride: 103 mmol/L (ref 98–111)
Chloride: 104 mmol/L (ref 98–111)
Creatinine, Ser: 3.06 mg/dL — ABNORMAL HIGH (ref 0.61–1.24)
Creatinine, Ser: 3.13 mg/dL — ABNORMAL HIGH (ref 0.61–1.24)
GFR calc Af Amer: 25 mL/min — ABNORMAL LOW (ref >60)
GFR calc Af Amer: 25 mL/min — ABNORMAL LOW (ref >60)
GFR calc non Af Amer: 22 mL/min — ABNORMAL LOW (ref >60)
GFR calc non Af Amer: 21 mL/min — ABNORMAL LOW (ref >60)
Glucose, Bld: 192 mg/dL — ABNORMAL HIGH (ref 70–99)
Glucose, Bld: 257 mg/dL — ABNORMAL HIGH (ref 70–99)
Phosphorus: 2.2 mg/dL — ABNORMAL LOW (ref 2.5–4.6)
Phosphorus: 2.6 mg/dL (ref 2.5–4.6)
Potassium: 3.8 mmol/L (ref 3.5–5.1)
Potassium: 4.1 mmol/L (ref 3.5–5.1)
Sodium: 135 mmol/L (ref 135–145)
Sodium: 137 mmol/L (ref 135–145)

## 2019-08-21 LAB — TYPE AND SCREEN
ABO/RH(D): O POS
Antibody Screen: NEGATIVE
Unit division: 0
Unit division: 0
Unit division: 0

## 2019-08-21 LAB — BPAM RBC
Blood Product Expiration Date: 202011122359
Blood Product Expiration Date: 202011222359
Blood Product Expiration Date: 202011222359
ISSUE DATE / TIME: 202010181432
ISSUE DATE / TIME: 202010181955
Unit Type and Rh: 5100
Unit Type and Rh: 5100
Unit Type and Rh: 5100

## 2019-08-21 LAB — GLUCOSE, CAPILLARY
Glucose-Capillary: 176 mg/dL — ABNORMAL HIGH (ref 70–99)
Glucose-Capillary: 178 mg/dL — ABNORMAL HIGH (ref 70–99)
Glucose-Capillary: 203 mg/dL — ABNORMAL HIGH (ref 70–99)
Glucose-Capillary: 219 mg/dL — ABNORMAL HIGH (ref 70–99)
Glucose-Capillary: 227 mg/dL — ABNORMAL HIGH (ref 70–99)
Glucose-Capillary: 264 mg/dL — ABNORMAL HIGH (ref 70–99)

## 2019-08-21 LAB — MAGNESIUM: Magnesium: 2.8 mg/dL — ABNORMAL HIGH (ref 1.7–2.4)

## 2019-08-21 LAB — AMMONIA: Ammonia: 87 umol/L — ABNORMAL HIGH (ref 9–35)

## 2019-08-21 LAB — APTT: aPTT: 72 seconds — ABNORMAL HIGH (ref 24–36)

## 2019-08-21 MED ORDER — FENTANYL CITRATE (PF) 100 MCG/2ML IJ SOLN
25.0000 ug | INTRAMUSCULAR | Status: DC | PRN
  Administered 2019-08-21 (×3): 50 ug via INTRAVENOUS
  Filled 2019-08-21 (×3): qty 2

## 2019-08-21 MED ORDER — OXYCODONE HCL 5 MG PO TABS
5.0000 mg | ORAL_TABLET | Freq: Four times a day (QID) | ORAL | Status: DC | PRN
  Administered 2019-08-21 – 2019-08-22 (×3): 5 mg via ORAL
  Filled 2019-08-21 (×3): qty 1

## 2019-08-21 MED ORDER — LACTULOSE 10 GM/15ML PO SOLN
30.0000 g | Freq: Three times a day (TID) | ORAL | Status: DC
  Administered 2019-08-21 – 2019-08-22 (×4): 30 g via ORAL
  Filled 2019-08-21 (×4): qty 45

## 2019-08-21 NOTE — Progress Notes (Signed)
eLink Physician-Brief Progress Note Patient Name: Edward Rocha. DOB: July 05, 1966 MRN: [id number]   Date of Service  08/21/2019  HPI/Events of Note  Pt vomited earlier tonight, it was a fair amount and included some altered blood which RN thought was likely blood swallowed from posterior pharyngeal trauma during NG tube insertion yesterday, concern was for possible aspiration event but patient's respiratory rate and saturation has remained unchanged. He is currently without NG tube for medication delivery access.  eICU Interventions  Will continue to monitor for signs and symptoms of aspiration-Stat CXR will be obtained if indicated. RN will replace NG tube after a 2 hour wait to confirm lack of symptoms of aspiration.        Kerry Kass Akif Weldy 08/21/2019, 11:43 PM

## 2019-08-21 NOTE — Progress Notes (Addendum)
  Speech Language Pathology Treatment: Dysphagia  Patient Details Name: Edward Rocha. MRN: [id number] DOB: 1966-05-16 Today's Date: 08/21/2019 Time: [id number] SLP Time Calculation (min) (ACUTE ONLY): 22 min  Assessment / Plan / Recommendation Clinical Impression  Pt was encountered lethargic in bed, with observed moaning and active bleeding from his R nare.  Pt was additionally observed to have wet vocal quality and dried blood coating his lingual surface.  RN was immediately made aware of findings.  SLP completed oral care with sage swab and suction; however, it was limited secondary to pt's pain.  PO trials were not attempted on this date secondary to lethargy and baseline wet vocal quality.  Suspect that pt may be aspirating his secretions based on clinical presentation.  Recommend continuation of NPO with alternate means of nutrition and frequent oral care.  SLP will continue to follow per POC.    HPI HPI: Pt is a 53 yo who presented on 10/12 for L common femoral to posterior tibial composite bypass with PTFE and saphenous vein. Hospital course was complicated by AKI with CRRT started 10/18, acute respiratory failure, and AMS. PMH includes: DM, HTN, HLD, thrombocytpenia, and PAD s/p bilateral common iliac stenting on 4/9      SLP Plan  Continue with current plan of care       Recommendations  Diet recommendations: NPO Medication Administration: Via alternative means(if necessary )                Oral Care Recommendations: Oral care QID Follow up Recommendations: Skilled Nursing facility SLP Visit Diagnosis: Dysphagia, oropharyngeal phase (R13.12) Plan: Continue with current plan of care       Bretta Bang, M.S., [place] Office: [phone]              [place] 08/21/2019, 12:47 PM

## 2019-08-21 NOTE — Progress Notes (Signed)
Dr Lucile Shutters cameraed in for update on pt. Pt sats 99% on RA, no sxs at this time of aspiration, no further emesis. Continue to monitor closely.

## 2019-08-21 NOTE — Progress Notes (Signed)
eLink Physician-Brief Progress Note Patient Name: Edward Rocha. DOB: 1965/12/02 MRN: [id number]   Date of Service  08/21/2019  HPI/Events of Note  Patient drowsy, also thrombocytopenic.  eICU Interventions  a.m. CBC, ammonia level        Kariss Longmire U Nirel Babler 08/21/2019, 6:07 AM

## 2019-08-21 NOTE — Progress Notes (Signed)
Pt continues to moan, call out, repetitive words "Help me" and other words difficult to understand. Responds verbally to orientation questions but with delayed response and 1 word answers. Follows simple commands. Treating pain with prn orders. Continue to offer emotional support and reassurance.   Called CCM to discuss case and review mental status, requested ammonia level.

## 2019-08-21 NOTE — Progress Notes (Addendum)
Patient continues to moan and yell out. Unable to voice concerns. Per Carlis Abbott MD, minimize giving fentanyl prn as much as possible and try to use oxycodone. Patient always states that he "hurts everywhere" despite giving pain medicine. Orientation questions answered appropriately but poor judgement/attention. HR and RR elevated during periods of agitation. Carlis Abbott MD aware.

## 2019-08-21 NOTE — Progress Notes (Addendum)
Subjective:  CRRT discontinued at 3:30 pm yesterday. Urine output increasing since then although not ideal. Labs worsened since yesterday. Had NG tube placed for nutritional support.  Patient remains confused and moaning. Does follow commands.  Also a nosebleed nursing though from traumatic NGT placement   Objective Vital signs in last 24 hours: Vitals:   08/21/19 0400 08/21/19 0500 08/21/19 0600 08/21/19 0700  BP: (!) 131/53 133/65 121/65 129/68  Pulse: (!) 102 (!) 105 (!) 105 (!) 105  Resp: 15 20 14 12   Temp: 99 F (37.2 C)   99 F (37.2 C)  TempSrc: Oral   Oral  SpO2: 99% 96% 96% 99%  Weight:  88.2 kg    Height:       Weight change: -2.3 kg  Intake/Output Summary (Last 24 hours) at 08/21/2019 0810 Last data filed at 08/21/2019 0700 Gross per 24 hour  Intake 1304.73 ml  Output 2142 ml  Net -837.27 ml    Assessment/ Plan: Pt is a53 y.o.yo malewho was admitted on 10/12/2020for left femoral to PTA bypass graft placement and subsequently developed oliguric renal failure requiring CRRT.  Assessment/Plan: 1.AKI (baseline crt ~1) - multifactorial in the setting of ischemic ATN with ABLA and relative hypotension, contrast nephropathy and BOO. Ultimately required initiation of CRRT on 10/18due to profound volume overloadafter failing high doses of Lasix and metolazone.Labs and volume status greatly improved as a result of CRRT which was discontinued yesterday at 15:30. Since then, he's had 240 ml of urine output. Labs are worsened, but no emergent indication for HD. Will monitor for today and re-evaluate need to resume HD tomorrow. Still hopeful for renal recovery since his baseline crt was good prior to procedure.  2. Acute hypoxic respiratory failure - volume overload vs multifocal pneumonia. Ison empiric coverage with cefepime x7 days.Procal trending down. Improved with volume removal and antibiotic therapy and is now oxygenating on room air.  3. ABLA - received total of 3  units since admission. Hgb 8.2 this morning. Continue ESAand transfuse as needed. 4. Hyponatremia due to hypervolemia - resolved 5. HTN - BP stable; holdings meds; will be stable to do intermittent HD if needed.  6. Thrombocytopenia - chronic and worked up by hematology without etiology. HIT panel negative. On heparin. 7. DM type 2 - poorly controlled. Holding Invokana due to AKI. SSI as needed  8. Elytes - K stable; corrected calcium ok; phos is slightly low but should come back up now that CRRT has been discontinued.  9. Metabolic acidosis - due to acute renal failure; resolved with CRRT, but bicarb downtrended from yesterday after discontinuing. Continue to monitor need for resuming HD.   Delice Bison   Patient seen and examined, agree with above note with above modifications.  Pt still exhibiting bizarre behavior- repeating same phrase again and again but will focus to follow commands.  CRRT stopped yest afternoon-  Making urine and it does seem to be increasing but not where we need to be at this point.  BUN and crt inc but no need for HD- keep access and foley in and reassess in AM for dialysis need  Corliss Parish, MD 08/21/2019      Labs: Basic Metabolic Panel: Recent Labs  Lab 08/20/19 0354 08/20/19 1600 08/21/19 0504  NA 135 136 135  K 3.7 4.0 3.8  CL 101 101 103  CO2 24 23 21*  GLUCOSE 98 122* 192*  BUN 36* 34* 59*  CREATININE 2.01* 2.04* 3.06*  CALCIUM 8.1* 8.2*  7.8*  PHOS 2.1* 2.6 2.2*   Liver Function Tests: Recent Labs  Lab 08/20/19 0354 08/20/19 1600 08/21/19 0504  ALBUMIN 1.9* 2.0* 1.8*   No results for input(s): LIPASE, AMYLASE in the last 168 hours. Recent Labs  Lab 08/21/19 0646  AMMONIA 87*   CBC: Recent Labs  Lab 08/17/19 0224  08/18/19 0333 08/19/19 0721 08/20/19 0354 08/21/19 0646  WBC 12.7*  --  13.1* 13.7* 15.7* 17.3*  NEUTROABS  --   --   --   --   --  PENDING  HGB 7.4*   < > 8.1* 8.7* 9.0* 8.2*  HCT 21.8*   < >  23.9* 26.9* 27.0* 26.4*  MCV 82.9  --  83.3 86.2 85.2 89.2  PLT 75*  --  77* 86* 88* 79*   < > = values in this interval not displayed.   Cardiac Enzymes: No results for input(s): CKTOTAL, CKMB, CKMBINDEX, TROPONINI in the last 168 hours. CBG: Recent Labs  Lab 08/20/19 1600 08/20/19 2007 08/21/19 0048 08/21/19 0420 08/21/19 0746  GLUCAP 137* 141* 176* 178* 203*    Iron Studies: No results for input(s): IRON, TIBC, TRANSFERRIN, FERRITIN in the last 72 hours. Studies/Results: Dg Abd Portable 1v  Result Date: 08/20/2019 CLINICAL DATA:  NG tube placement EXAM: PORTABLE ABDOMEN - 1 VIEW COMPARISON:  08/20/2019 FINDINGS: Esophageal tube tip is in the left upper quadrant over the gastric fundus. Gas pattern is nonobstructed IMPRESSION: Esophageal tube tip in the left upper quadrant overlying the gastric fundus. Electronically Signed   By: Donavan Foil M.D.   On: 08/20/2019 17:55   Dg Abd Portable 1v  Result Date: 08/20/2019 CLINICAL DATA:  Status post attempted NG tube placement. EXAM: PORTABLE ABDOMEN - 1 VIEW COMPARISON:  Single-view of the abdomen earlier today. FINDINGS: No NG tube is visualized.  Central venous catheter noted. IMPRESSION: NG tube is not seen. Electronically Signed   By: Inge Rise M.D.   On: 08/20/2019 14:48   Dg Abd Portable 1v  Result Date: 08/20/2019 CLINICAL DATA:  Check gastric catheter placement EXAM: PORTABLE ABDOMEN - 1 VIEW COMPARISON:  None. FINDINGS: Scattered large and small bowel gas is noted. Dialysis catheter is seen in satisfactory position. There is a linear density identified over the midline at the level of the aortic knob. This may represent the tip of the gastric catheter. Withdrawal and replacement is recommended. IMPRESSION: Linear density is noted over the upper chest which may represent the tip of the gastric catheter. This may be coiled within the pharynx. Withdrawal and replacement is recommended. Electronically Signed   By: Inez Catalina M.D.   On: 08/20/2019 14:37   Medications: Infusions: .  prismasol BGK 4/2.5 400 mL/hr at 08/20/19 0254  .  prismasol BGK 4/2.5 200 mL/hr at 08/20/19 0351  . sodium chloride 10 mL/hr at 08/20/19 0933  . sodium chloride    . ceFEPime (MAXIPIME) IV 2 g (08/20/19 2215)  . feeding supplement (OSMOLITE 1.5 CAL) 50 mL/hr at 08/21/19 0600  . magnesium sulfate bolus IVPB    . prismasol BGK 4/2.5 1,800 mL/hr at 08/20/19 [id number]    Scheduled Medications: . sodium chloride   Intravenous Once  . aspirin EC  81 mg Oral Daily  . atorvastatin  80 mg Oral Daily  . chlorhexidine  15 mL Mouth Rinse BID  . Chlorhexidine Gluconate Cloth  6 each Topical Daily  . clopidogrel  75 mg Oral Daily  . darbepoetin (ARANESP) injection - NON-DIALYSIS  150 mcg  Subcutaneous Q Mon-1800  . docusate sodium  100 mg Oral Daily  . feeding supplement (PRO-STAT SUGAR FREE 64)  30 mL Per Tube BID  . heparin injection (subcutaneous)  5,000 Units Subcutaneous Q8H  . insulin aspart  0-9 Units Subcutaneous TID WC  . insulin glargine  20 Units Subcutaneous QPM  . mouth rinse  15 mL Mouth Rinse q12n4p  . pantoprazole  40 mg Oral Daily  . sodium chloride flush  3 mL Intravenous Q12H    have reviewed scheduled and prn medications.  Physical Exam: General: awake, confused, persistently moaning  Heart: tachycardic  Lungs: saturating well on room air; lungs clear  Abdomen: mildly distended, non-tender  Extremities: 2+ edema in LLE, trace in RLE  Neuro: disoriented and moaning but follows basic commands and does not exhibit any focal deficits Dialysis Access: right IJ temp cath placed 10/18    08/21/2019,8:10 AM  LOS: 10 days

## 2019-08-21 NOTE — Progress Notes (Signed)
[facility]  Subjective  - Ongoing confusion and moaning.  CRRT stopped yesterday.  Tube feeds started via NG tube.   Objective (!) 145/63 (!) 107 99 F (37.2 C) (Oral) (!) 25 98%  Intake/Output Summary (Last 24 hours) at 08/21/2019 1036 Last data filed at 08/21/2019 0900 Gross per 24 hour  Intake 1343.39 ml  Output 1734 ml  Net -390.61 ml    Left PT brisk signal Left groin incision c/d/i Left leg incisions c/d/i Volume overload improving Room air Abdomen soft  Laboratory Lab Results: Recent Labs    08/20/19 0354 08/21/19 0646  WBC 15.7* 17.3*  HGB 9.0* 8.2*  HCT 27.0* 26.4*  PLT 88* 79*   BMET Recent Labs    08/20/19 1600 08/21/19 0504  NA 136 135  K 4.0 3.8  CL 101 103  CO2 23 21*  GLUCOSE 122* 192*  BUN 34* 59*  CREATININE 2.04* 3.06*  CALCIUM 8.2* 7.8*    COAG Lab Results  Component Value Date   INR 1.2 08/07/2019   INR 1.3 (H) 05/27/2019   No results found for: PTT  Assessment/Planning:  Postop day 10 status post left common femoral to PT bypass with composite PTFE and GSV. Continues to have a very brisk posterior tibial signal in the left foot.   AKI post-op progressed to oliguric renal failure.  Tried aggressive diuresis with high dose lasix and no improvement with worsening respiratory status.  CRRT and pulled off 10 L.  On room air now.  CRRT on hold and monitoring.  ABLA post-op.  Hgb 8.2 today.  No indication for transfusion.    Has chronic thrombocytopenia that was worked up by hematology prior to surgery and previously had trial of steroids with no improvement. Platelets are 79, no active bleeding.  Also on cefepime and being treated for pneumonia.  WBC rising yesterday and today.  Will monitor.    Hopefully mental status improves as BUN improves with CRRT.  Ammonia elevated at 87.  Will start lactulose.  Hopefully will help with bowel movement as well.  Appreciate nephrology  input.     [name] 08/21/2019 10:36 AM --

## 2019-08-21 NOTE — Progress Notes (Addendum)
Pt dislodged NGT, RN at bedside assessing and pt vomited large amount of tube feeds, blood tinged then vomited a large clot. Pt has decr platelets, has had bloody oral secretions since NGT placement yesterday, has had some gurgling in back of throat that pt has been unable to clear and RN has been unable to suction.    VS stable for pt at this time with ST 110-120, Sats 96%, no sxs of aspiration.   Called CCM, spoke to Dr Lucile Shutters to update. Will monitor pt for sxs of respiratory distress/aspiration, replace NGT later in night. Considering chest xray now vs in am, awaiting orders.   Discussed pain control and pt's mental status, MD will order tylenol.

## 2019-08-22 ENCOUNTER — Encounter (HOSPITAL_COMMUNITY)
Admission: RE | Disposition: A | Discharge status: Home Health | Payer: Self-pay | Source: Home / Self Care | Attending: Vascular Surgery

## 2019-08-22 ENCOUNTER — Inpatient Hospital Stay (HOSPITAL_COMMUNITY): Payer: Managed Care, Other (non HMO)

## 2019-08-22 ENCOUNTER — Encounter (HOSPITAL_COMMUNITY): Payer: Self-pay | Admitting: Emergency Medicine

## 2019-08-22 ENCOUNTER — Other Ambulatory Visit: Payer: Self-pay | Admitting: Otolaryngology

## 2019-08-22 DIAGNOSIS — K920 Hematemesis: Secondary | ICD-10-CM

## 2019-08-22 DIAGNOSIS — J9601 Acute respiratory failure with hypoxia: Secondary | ICD-10-CM | POA: Diagnosis not present

## 2019-08-22 DIAGNOSIS — Z978 Presence of other specified devices: Secondary | ICD-10-CM

## 2019-08-22 DIAGNOSIS — K922 Gastrointestinal hemorrhage, unspecified: Secondary | ICD-10-CM

## 2019-08-22 DIAGNOSIS — I8510 Secondary esophageal varices without bleeding: Secondary | ICD-10-CM | POA: Diagnosis not present

## 2019-08-22 DIAGNOSIS — T17908A Unspecified foreign body in respiratory tract, part unspecified causing other injury, initial encounter: Secondary | ICD-10-CM

## 2019-08-22 DIAGNOSIS — G9340 Encephalopathy, unspecified: Secondary | ICD-10-CM | POA: Diagnosis not present

## 2019-08-22 DIAGNOSIS — J9600 Acute respiratory failure, unspecified whether with hypoxia or hypercapnia: Secondary | ICD-10-CM

## 2019-08-22 HISTORY — PX: ESOPHAGEAL BANDING: SHX5518

## 2019-08-22 HISTORY — PX: ESOPHAGOGASTRODUODENOSCOPY: SHX5428

## 2019-08-22 LAB — CBC WITH DIFFERENTIAL/PLATELET
Abs Immature Granulocytes: 0.92 10*3/uL — ABNORMAL HIGH (ref 0.00–0.07)
Basophils Absolute: 0.1 10*3/uL (ref 0.0–0.1)
Basophils Relative: 0 %
Eosinophils Absolute: 0.3 10*3/uL (ref 0.0–0.5)
Eosinophils Relative: 1 %
HCT: 25.8 % — ABNORMAL LOW (ref 39.0–52.0)
Hemoglobin: 7.9 g/dL — ABNORMAL LOW (ref 13.0–17.0)
Immature Granulocytes: 3 %
Lymphocytes Relative: 10 %
Lymphs Abs: 2.7 10*3/uL (ref 0.7–4.0)
MCH: 27.2 pg (ref 26.0–34.0)
MCHC: 30.6 g/dL (ref 30.0–36.0)
MCV: 89 fL (ref 80.0–100.0)
Monocytes Absolute: 2.3 10*3/uL — ABNORMAL HIGH (ref 0.1–1.0)
Monocytes Relative: 8 %
Neutro Abs: 21.7 10*3/uL — ABNORMAL HIGH (ref 1.7–7.7)
Neutrophils Relative %: 78 %
Platelets: 79 10*3/uL — ABNORMAL LOW (ref 150–400)
RBC: 2.9 MIL/uL — ABNORMAL LOW (ref 4.22–5.81)
RDW: 15.8 % — ABNORMAL HIGH (ref 11.5–15.5)
WBC: 28 10*3/uL — ABNORMAL HIGH (ref 4.0–10.5)
nRBC: 0.3 % — ABNORMAL HIGH (ref 0.0–0.2)

## 2019-08-22 LAB — GLUCOSE, CAPILLARY
Glucose-Capillary: 182 mg/dL — ABNORMAL HIGH (ref 70–99)
Glucose-Capillary: 194 mg/dL — ABNORMAL HIGH (ref 70–99)
Glucose-Capillary: 234 mg/dL — ABNORMAL HIGH (ref 70–99)
Glucose-Capillary: 243 mg/dL — ABNORMAL HIGH (ref 70–99)
Glucose-Capillary: 246 mg/dL — ABNORMAL HIGH (ref 70–99)
Glucose-Capillary: 246 mg/dL — ABNORMAL HIGH (ref 70–99)
Glucose-Capillary: 272 mg/dL — ABNORMAL HIGH (ref 70–99)

## 2019-08-22 LAB — POCT I-STAT 7, (LYTES, BLD GAS, ICA,H+H)
Acid-base deficit: 1 mmol/L (ref 0.0–2.0)
Acid-base deficit: 2 mmol/L (ref 0.0–2.0)
Bicarbonate: 22.4 mmol/L (ref 20.0–28.0)
Bicarbonate: 23.7 mmol/L (ref 20.0–28.0)
Calcium, Ion: 1.16 mmol/L (ref 1.15–1.40)
Calcium, Ion: 1.14 mmol/L — ABNORMAL LOW (ref 1.15–1.40)
HCT: 26 % — ABNORMAL LOW (ref 39.0–52.0)
HCT: 29 % — ABNORMAL LOW (ref 39.0–52.0)
Hemoglobin: 8.8 g/dL — ABNORMAL LOW (ref 13.0–17.0)
Hemoglobin: 9.9 g/dL — ABNORMAL LOW (ref 13.0–17.0)
O2 Saturation: 97 %
O2 Saturation: 100 %
Patient temperature: 98.9
Patient temperature: 99.4
Potassium: 4.1 mmol/L (ref 3.5–5.1)
Potassium: 4.8 mmol/L (ref 3.5–5.1)
Sodium: 142 mmol/L (ref 135–145)
Sodium: 141 mmol/L (ref 135–145)
TCO2: 23 mmol/L (ref 22–32)
TCO2: 25 mmol/L (ref 22–32)
pCO2 arterial: 30.1 mmHg — ABNORMAL LOW (ref 32.0–48.0)
pCO2 arterial: 43.6 mmHg (ref 32.0–48.0)
pH, Arterial: 7.48 — ABNORMAL HIGH (ref 7.350–7.450)
pH, Arterial: 7.345 — ABNORMAL LOW (ref 7.350–7.450)
pO2, Arterial: 81 mmHg — ABNORMAL LOW (ref 83.0–108.0)
pO2, Arterial: 273 mmHg — ABNORMAL HIGH (ref 83.0–108.0)

## 2019-08-22 LAB — COMPREHENSIVE METABOLIC PANEL WITH GFR
ALT: 27 U/L (ref 0–44)
AST: 63 U/L — ABNORMAL HIGH (ref 15–41)
Albumin: 1.8 g/dL — ABNORMAL LOW (ref 3.5–5.0)
Alkaline Phosphatase: 88 U/L (ref 38–126)
Anion gap: 10 (ref 5–15)
BUN: 96 mg/dL — ABNORMAL HIGH (ref 6–20)
CO2: 23 mmol/L (ref 22–32)
Calcium: 8.1 mg/dL — ABNORMAL LOW (ref 8.9–10.3)
Chloride: 105 mmol/L (ref 98–111)
Creatinine, Ser: 3.65 mg/dL — ABNORMAL HIGH (ref 0.61–1.24)
GFR calc Af Amer: 20 mL/min — ABNORMAL LOW
GFR calc non Af Amer: 18 mL/min — ABNORMAL LOW
Glucose, Bld: 251 mg/dL — ABNORMAL HIGH (ref 70–99)
Potassium: 4.6 mmol/L (ref 3.5–5.1)
Sodium: 138 mmol/L (ref 135–145)
Total Bilirubin: 2 mg/dL — ABNORMAL HIGH (ref 0.3–1.2)
Total Protein: 7.1 g/dL (ref 6.5–8.1)

## 2019-08-22 LAB — DIC (DISSEMINATED INTRAVASCULAR COAGULATION)PANEL
D-Dimer, Quant: 3.63 ug/mL-FEU — ABNORMAL HIGH (ref 0.00–0.50)
Fibrinogen: 433 mg/dL (ref 210–475)
INR: 1.5 — ABNORMAL HIGH (ref 0.8–1.2)
Platelets: 80 10*3/uL — ABNORMAL LOW (ref 150–400)
Prothrombin Time: 17.6 seconds — ABNORMAL HIGH (ref 11.4–15.2)
aPTT: 44 seconds — ABNORMAL HIGH (ref 24–36)

## 2019-08-22 LAB — RENAL FUNCTION PANEL
Albumin: 1.8 g/dL — ABNORMAL LOW (ref 3.5–5.0)
Albumin: 1.8 g/dL — ABNORMAL LOW (ref 3.5–5.0)
Anion gap: 11 (ref 5–15)
Anion gap: 11 (ref 5–15)
BUN: 81 mg/dL — ABNORMAL HIGH (ref 6–20)
BUN: 92 mg/dL — ABNORMAL HIGH (ref 6–20)
CO2: 22 mmol/L (ref 22–32)
CO2: 22 mmol/L (ref 22–32)
Calcium: 8.1 mg/dL — ABNORMAL LOW (ref 8.9–10.3)
Calcium: 8.3 mg/dL — ABNORMAL LOW (ref 8.9–10.3)
Chloride: 105 mmol/L (ref 98–111)
Chloride: 106 mmol/L (ref 98–111)
Creatinine, Ser: 3.32 mg/dL — ABNORMAL HIGH (ref 0.61–1.24)
Creatinine, Ser: 3.47 mg/dL — ABNORMAL HIGH (ref 0.61–1.24)
GFR calc Af Amer: 23 mL/min — ABNORMAL LOW (ref >60)
GFR calc Af Amer: 22 mL/min — ABNORMAL LOW (ref >60)
GFR calc non Af Amer: 20 mL/min — ABNORMAL LOW (ref >60)
GFR calc non Af Amer: 19 mL/min — ABNORMAL LOW (ref >60)
Glucose, Bld: 267 mg/dL — ABNORMAL HIGH (ref 70–99)
Glucose, Bld: 250 mg/dL — ABNORMAL HIGH (ref 70–99)
Phosphorus: 3 mg/dL (ref 2.5–4.6)
Phosphorus: 3.4 mg/dL (ref 2.5–4.6)
Potassium: 4.3 mmol/L (ref 3.5–5.1)
Potassium: 4 mmol/L (ref 3.5–5.1)
Sodium: 138 mmol/L (ref 135–145)
Sodium: 139 mmol/L (ref 135–145)

## 2019-08-22 LAB — CBC
HCT: 25.6 % — ABNORMAL LOW (ref 39.0–52.0)
HCT: 24 % — ABNORMAL LOW (ref 39.0–52.0)
Hemoglobin: 8.1 g/dL — ABNORMAL LOW (ref 13.0–17.0)
Hemoglobin: 7.6 g/dL — ABNORMAL LOW (ref 13.0–17.0)
MCH: 27.9 pg (ref 26.0–34.0)
MCH: 28.1 pg (ref 26.0–34.0)
MCHC: 31.6 g/dL (ref 30.0–36.0)
MCHC: 31.7 g/dL (ref 30.0–36.0)
MCV: 88.3 fL (ref 80.0–100.0)
MCV: 88.9 fL (ref 80.0–100.0)
Platelets: 75 10*3/uL — ABNORMAL LOW (ref 150–400)
Platelets: 79 10*3/uL — ABNORMAL LOW (ref 150–400)
RBC: 2.9 MIL/uL — ABNORMAL LOW (ref 4.22–5.81)
RBC: 2.7 MIL/uL — ABNORMAL LOW (ref 4.22–5.81)
RDW: 15.4 % (ref 11.5–15.5)
RDW: 15.9 % — ABNORMAL HIGH (ref 11.5–15.5)
WBC: 25.4 10*3/uL — ABNORMAL HIGH (ref 4.0–10.5)
WBC: 28.7 10*3/uL — ABNORMAL HIGH (ref 4.0–10.5)
nRBC: 0.2 % (ref 0.0–0.2)
nRBC: 0.1 % (ref 0.0–0.2)

## 2019-08-22 LAB — CULTURE, BLOOD (ROUTINE X 2)
Culture: NO GROWTH
Culture: NO GROWTH

## 2019-08-22 LAB — PROCALCITONIN: Procalcitonin: 0.55 ng/mL

## 2019-08-22 LAB — PHOSPHORUS: Phosphorus: 4.8 mg/dL — ABNORMAL HIGH (ref 2.5–4.6)

## 2019-08-22 LAB — HEPATIC FUNCTION PANEL
ALT: 28 U/L (ref 0–44)
AST: 69 U/L — ABNORMAL HIGH (ref 15–41)
Albumin: 1.8 g/dL — ABNORMAL LOW (ref 3.5–5.0)
Alkaline Phosphatase: 90 U/L (ref 38–126)
Bilirubin, Direct: 1 mg/dL — ABNORMAL HIGH (ref 0.0–0.2)
Indirect Bilirubin: 1.1 mg/dL — ABNORMAL HIGH (ref 0.3–0.9)
Total Bilirubin: 2.1 mg/dL — ABNORMAL HIGH (ref 0.3–1.2)
Total Protein: 7.1 g/dL (ref 6.5–8.1)

## 2019-08-22 LAB — APTT
aPTT: 49 seconds — ABNORMAL HIGH (ref 24–36)
aPTT: 44 seconds — ABNORMAL HIGH (ref 24–36)

## 2019-08-22 LAB — LACTIC ACID, PLASMA: Lactic Acid, Venous: 2 mmol/L (ref 0.5–1.9)

## 2019-08-22 LAB — PROTIME-INR
INR: 1.4 — ABNORMAL HIGH (ref 0.8–1.2)
Prothrombin Time: 17.4 seconds — ABNORMAL HIGH (ref 11.4–15.2)

## 2019-08-22 LAB — MAGNESIUM
Magnesium: 3.2 mg/dL — ABNORMAL HIGH (ref 1.7–2.4)
Magnesium: 3.5 mg/dL — ABNORMAL HIGH (ref 1.7–2.4)

## 2019-08-22 LAB — AMMONIA: Ammonia: 60 umol/L — ABNORMAL HIGH (ref 9–35)

## 2019-08-22 SURGERY — EGD (ESOPHAGOGASTRODUODENOSCOPY)
Anesthesia: Moderate Sedation

## 2019-08-22 MED ORDER — FENTANYL CITRATE (PF) 100 MCG/2ML IJ SOLN
100.0000 ug | Freq: Once | INTRAMUSCULAR | Status: AC
  Administered 2019-08-22: 14:00:00 100 ug via INTRAVENOUS

## 2019-08-22 MED ORDER — FENTANYL 2500MCG IN NS 250ML (10MCG/ML) PREMIX INFUSION
50.0000 ug/h | INTRAVENOUS | Status: DC
  Administered 2019-08-22 – 2019-08-23 (×2): 100 ug/h via INTRAVENOUS
  Filled 2019-08-22 (×2): qty 250

## 2019-08-22 MED ORDER — THIAMINE HCL 100 MG/ML IJ SOLN
100.0000 mg | Freq: Every day | INTRAMUSCULAR | Status: DC
  Administered 2019-08-22 – 2019-08-30 (×6): 100 mg via INTRAVENOUS
  Filled 2019-08-22 (×7): qty 2

## 2019-08-22 MED ORDER — VITAMIN B-1 100 MG PO TABS
100.0000 mg | ORAL_TABLET | Freq: Every day | ORAL | Status: DC
  Administered 2019-08-25 – 2019-09-10 (×13): 100 mg via ORAL
  Filled 2019-08-22 (×14): qty 1

## 2019-08-22 MED ORDER — VANCOMYCIN HCL 10 G IV SOLR
1250.0000 mg | INTRAVENOUS | Status: DC
  Administered 2019-08-22: 17:00:00 1250 mg via INTRAVENOUS
  Filled 2019-08-22 (×2): qty 1250

## 2019-08-22 MED ORDER — SODIUM CHLORIDE 0.9% IV SOLUTION: Freq: Once | INTRAVENOUS | Status: DC

## 2019-08-22 MED ORDER — FENTANYL BOLUS VIA INFUSION
50.0000 ug | INTRAVENOUS | Status: DC | PRN
  Filled 2019-08-22: qty 50

## 2019-08-22 MED ORDER — INSULIN ASPART 100 UNIT/ML ~~LOC~~ SOLN
0.0000 [IU] | SUBCUTANEOUS | Status: DC
  Administered 2019-08-23 (×5): 2 [IU] via SUBCUTANEOUS
  Administered 2019-08-24: 3 [IU] via SUBCUTANEOUS
  Administered 2019-08-24 (×2): 2 [IU] via SUBCUTANEOUS
  Administered 2019-08-24: 3 [IU] via SUBCUTANEOUS
  Administered 2019-08-24: 2 [IU] via SUBCUTANEOUS
  Administered 2019-08-24 – 2019-08-25 (×2): 1 [IU] via SUBCUTANEOUS
  Administered 2019-08-25: 2 [IU] via SUBCUTANEOUS
  Administered 2019-08-25 (×3): 1 [IU] via SUBCUTANEOUS
  Administered 2019-08-25: 2 [IU] via SUBCUTANEOUS
  Administered 2019-08-26 (×2): 3 [IU] via SUBCUTANEOUS
  Administered 2019-08-26: 1 [IU] via SUBCUTANEOUS
  Administered 2019-08-26: 2 [IU] via SUBCUTANEOUS
  Administered 2019-08-26: 17:00:00 3 [IU] via SUBCUTANEOUS
  Administered 2019-08-27 (×2): 2 [IU] via SUBCUTANEOUS
  Administered 2019-08-27: 5 [IU] via SUBCUTANEOUS
  Administered 2019-08-27: 3 [IU] via SUBCUTANEOUS
  Administered 2019-08-27 (×2): 2 [IU] via SUBCUTANEOUS
  Administered 2019-08-27 – 2019-08-28 (×2): 3 [IU] via SUBCUTANEOUS
  Administered 2019-08-28: 2 [IU] via SUBCUTANEOUS
  Administered 2019-08-28: 1 [IU] via SUBCUTANEOUS
  Administered 2019-08-29 (×2): 2 [IU] via SUBCUTANEOUS
  Administered 2019-08-29: 3 [IU] via SUBCUTANEOUS
  Administered 2019-08-29: 2 [IU] via SUBCUTANEOUS
  Administered 2019-08-30 (×2): 3 [IU] via SUBCUTANEOUS
  Administered 2019-08-30: 2 [IU] via SUBCUTANEOUS
  Administered 2019-08-30: 3 [IU] via SUBCUTANEOUS
  Administered 2019-08-31 (×2): 2 [IU] via SUBCUTANEOUS
  Administered 2019-08-31: 1 [IU] via SUBCUTANEOUS
  Administered 2019-08-31 – 2019-09-01 (×4): 2 [IU] via SUBCUTANEOUS
  Administered 2019-09-01: 20:00:00 3 [IU] via SUBCUTANEOUS
  Administered 2019-09-01: 19:00:00 5 [IU] via SUBCUTANEOUS
  Administered 2019-09-01: 1 [IU] via SUBCUTANEOUS
  Administered 2019-09-01 – 2019-09-02 (×2): 5 [IU] via SUBCUTANEOUS
  Administered 2019-09-02: 05:00:00 2 [IU] via SUBCUTANEOUS
  Administered 2019-09-02: 16:00:00 3 [IU] via SUBCUTANEOUS
  Administered 2019-09-02 (×2): 2 [IU] via SUBCUTANEOUS
  Administered 2019-09-03: 1 [IU] via SUBCUTANEOUS
  Administered 2019-09-03: 2 [IU] via SUBCUTANEOUS
  Administered 2019-09-03: 3 [IU] via SUBCUTANEOUS
  Administered 2019-09-03: 08:00:00 1 [IU] via SUBCUTANEOUS
  Administered 2019-09-03 – 2019-09-05 (×10): 2 [IU] via SUBCUTANEOUS
  Administered 2019-09-05: 04:00:00 1 [IU] via SUBCUTANEOUS
  Administered 2019-09-05 (×2): 2 [IU] via SUBCUTANEOUS
  Administered 2019-09-06 (×2): 1 [IU] via SUBCUTANEOUS
  Administered 2019-09-06 (×2): 2 [IU] via SUBCUTANEOUS

## 2019-08-22 MED ORDER — OCTREOTIDE LOAD VIA INFUSION
50.0000 ug | Freq: Once | INTRAVENOUS | Status: AC
  Administered 2019-08-22: 17:00:00 50 ug via INTRAVENOUS
  Filled 2019-08-22: qty 25

## 2019-08-22 MED ORDER — PANTOPRAZOLE SODIUM 40 MG IV SOLR: 40.0000 mg | Freq: Every day | INTRAVENOUS | Status: DC

## 2019-08-22 MED ORDER — ROCURONIUM BROMIDE 50 MG/5ML IV SOLN
50.0000 mg | Freq: Once | INTRAVENOUS | Status: AC
  Administered 2019-08-22: 50 mg via INTRAVENOUS
  Filled 2019-08-22: qty 5

## 2019-08-22 MED ORDER — CHLORHEXIDINE GLUCONATE 0.12% ORAL RINSE (MEDLINE KIT)
15.0000 mL | Freq: Two times a day (BID) | OROMUCOSAL | Status: DC
  Administered 2019-08-22 – 2019-08-27 (×10): 15 mL via OROMUCOSAL

## 2019-08-22 MED ORDER — LACTULOSE 10 GM/15ML PO SOLN
30.0000 g | Freq: Three times a day (TID) | ORAL | Status: DC
  Administered 2019-08-24 – 2019-08-29 (×13): 30 g
  Filled 2019-08-22 (×16): qty 45

## 2019-08-22 MED ORDER — FENTANYL CITRATE (PF) 100 MCG/2ML IJ SOLN
INTRAMUSCULAR | Status: AC
  Filled 2019-08-22: qty 2

## 2019-08-22 MED ORDER — INSULIN GLARGINE 100 UNIT/ML ~~LOC~~ SOLN
5.0000 [IU] | Freq: Every evening | SUBCUTANEOUS | Status: DC
  Administered 2019-08-23 – 2019-09-01 (×11): 5 [IU] via SUBCUTANEOUS
  Filled 2019-08-22 (×13): qty 0.05

## 2019-08-22 MED ORDER — ACETAMINOPHEN 160 MG/5ML PO SOLN
650.0000 mg | ORAL | Status: DC | PRN
  Administered 2019-08-22: 03:00:00 650 mg
  Filled 2019-08-22: qty 20.3

## 2019-08-22 MED ORDER — SODIUM CHLORIDE 0.9 % IV SOLN
8.0000 mg/h | INTRAVENOUS | Status: DC
  Filled 2019-08-22: qty 80

## 2019-08-22 MED ORDER — LORAZEPAM 2 MG/ML IJ SOLN: 0.0000 mg | Freq: Two times a day (BID) | INTRAMUSCULAR | Status: DC

## 2019-08-22 MED ORDER — ADULT MULTIVITAMIN LIQUID CH
15.0000 mL | Freq: Every day | ORAL | Status: DC
  Administered 2019-08-25: 15 mL
  Filled 2019-08-22: qty 15

## 2019-08-22 MED ORDER — FOLIC ACID 1 MG PO TABS: 1.0000 mg | ORAL_TABLET | Freq: Every day | ORAL | Status: DC

## 2019-08-22 MED ORDER — ORAL CARE MOUTH RINSE
15.0000 mL | OROMUCOSAL | Status: DC
  Administered 2019-08-22 – 2019-08-27 (×47): 15 mL via OROMUCOSAL

## 2019-08-22 MED ORDER — LORAZEPAM 2 MG/ML IJ SOLN: 0.5000 mg | INTRAMUSCULAR | Status: DC | PRN

## 2019-08-22 MED ORDER — PANTOPRAZOLE SODIUM 40 MG IV SOLR
40.0000 mg | INTRAVENOUS | Status: DC
  Administered 2019-08-22 – 2019-08-30 (×9): 40 mg via INTRAVENOUS
  Filled 2019-08-22 (×9): qty 40

## 2019-08-22 MED ORDER — LEVALBUTEROL HCL 0.63 MG/3ML IN NEBU: 0.6300 mg | INHALATION_SOLUTION | Freq: Four times a day (QID) | RESPIRATORY_TRACT | Status: DC | PRN

## 2019-08-22 MED ORDER — SODIUM CHLORIDE 0.9 % IV SOLN
80.0000 mg | Freq: Once | INTRAVENOUS | Status: DC
  Filled 2019-08-22: qty 80

## 2019-08-22 MED ORDER — INSULIN GLARGINE 100 UNIT/ML ~~LOC~~ SOLN: 10.0000 [IU] | Freq: Every evening | SUBCUTANEOUS | Status: DC

## 2019-08-22 MED ORDER — MIDAZOLAM HCL 2 MG/2ML IJ SOLN
INTRAMUSCULAR | Status: AC
  Filled 2019-08-22: qty 2

## 2019-08-22 MED ORDER — LABETALOL HCL 5 MG/ML IV SOLN
5.0000 mg | INTRAVENOUS | Status: DC | PRN
  Administered 2019-08-22 – 2019-09-09 (×6): 5 mg via INTRAVENOUS
  Filled 2019-08-22 (×6): qty 4

## 2019-08-22 MED ORDER — MIDAZOLAM HCL 2 MG/2ML IJ SOLN
2.0000 mg | Freq: Once | INTRAMUSCULAR | Status: AC
  Administered 2019-08-22: 14:00:00 2 mg via INTRAVENOUS

## 2019-08-22 MED ORDER — FENTANYL CITRATE (PF) 100 MCG/2ML IJ SOLN
INTRAMUSCULAR | Status: AC
  Filled 2019-08-22: qty 4

## 2019-08-22 MED ORDER — BISACODYL 10 MG RE SUPP: 10.0000 mg | Freq: Every day | RECTAL | Status: DC | PRN

## 2019-08-22 MED ORDER — PANTOPRAZOLE SODIUM 40 MG IV SOLR: 40.0000 mg | Freq: Two times a day (BID) | INTRAVENOUS | Status: DC

## 2019-08-22 MED ORDER — MIDAZOLAM HCL (PF) 10 MG/2ML IJ SOLN
INTRAMUSCULAR | Status: DC | PRN
  Administered 2019-08-22 (×2): 2 mg via INTRAVENOUS
  Administered 2019-08-22: 1 mg via INTRAVENOUS

## 2019-08-22 MED ORDER — ADULT MULTIVITAMIN W/MINERALS CH: 1.0000 | ORAL_TABLET | Freq: Every day | ORAL | Status: DC

## 2019-08-22 MED ORDER — DIPHENHYDRAMINE HCL 50 MG/ML IJ SOLN
INTRAMUSCULAR | Status: AC
  Filled 2019-08-22: qty 1

## 2019-08-22 MED ORDER — DOCUSATE SODIUM 50 MG/5ML PO LIQD: 100.0000 mg | Freq: Two times a day (BID) | ORAL | Status: DC | PRN

## 2019-08-22 MED ORDER — MIDAZOLAM HCL (PF) 5 MG/ML IJ SOLN
INTRAMUSCULAR | Status: AC
  Filled 2019-08-22: qty 3

## 2019-08-22 MED ORDER — SODIUM CHLORIDE 0.9 % IV SOLN
50.0000 ug/h | INTRAVENOUS | Status: DC
  Administered 2019-08-22 – 2019-08-24 (×5): 50 ug/h via INTRAVENOUS
  Administered 2019-08-25: 40 ug/h via INTRAVENOUS
  Filled 2019-08-22 (×7): qty 1

## 2019-08-22 MED ORDER — FENTANYL CITRATE (PF) 100 MCG/2ML IJ SOLN: 50.0000 ug | Freq: Once | INTRAMUSCULAR | Status: DC

## 2019-08-22 MED ORDER — LORAZEPAM 2 MG/ML IJ SOLN: 0.0000 mg | Freq: Four times a day (QID) | INTRAMUSCULAR | Status: DC

## 2019-08-22 MED ORDER — ETOMIDATE 2 MG/ML IV SOLN
20.0000 mg | Freq: Once | INTRAVENOUS | Status: AC
  Administered 2019-08-22: 14:00:00 20 mg via INTRAVENOUS

## 2019-08-22 NOTE — Progress Notes (Signed)
eLink Physician-Brief Progress Note Patient Name: Edward Rocha. DOB: 18-Dec-1965 MRN: [id number]   Date of Service  08/22/2019  HPI/Events of Note  Notified of SSI insulin schedule and basal insulin at 20 Currently NPO  eICU Interventions  Change SSI to q 4 and decrease Lantus to 5 from 20 give NPO status and renal insufficiency     Intervention Category Major Interventions: Hyperglycemia - active titration of insulin therapy  Judd Lien 08/22/2019, 10:38 PM

## 2019-08-22 NOTE — Progress Notes (Signed)
NGT replaced and TF restarted, slowly increasing, tolerating.   Tongue very dry with caked on blood, q 2 hr oral and lip care, continues to have frequent 'rattle' in back of throat, encouraging coughing up secretions, strong cough, RN using [place] to clear mouth  BM x 2 this shift. Mentation slightly improved, follows simple commands, answers orientation questions appropriately but often requires repeated questioning. Rested on and off during shift, occasionally calling out/moaning, fentanyl IV x 1, tylenol per tube x 1 and oxycodone per tube x 2 during shift.   Foley, urine output slowly increasing with 715 ml/24 hr. Followed by nephrology.

## 2019-08-22 NOTE — Progress Notes (Signed)
EGD completed. Pt. Tolerated well. 2   bands placed by Dr. Ardis Hughs ENT have been consulted  Dr. Constance Holster has been updated and is on his way  He is placing orders for his equipment to be brought to the bedside.   Magdalen Spatz, MSN, AGACNP-BC [place] Pager # [phone] After 4 pm please call [phone] 08/22/2019 4:22 PM

## 2019-08-22 NOTE — Op Note (Signed)
[facility] Patient Name: Edward Rocha Procedure Date : 08/22/2019 MRN: [id number] Attending MD: Milus Banister , MD Date of Birth: 06/11/1966 CSN: [id number] Age: 53 Admit Type: Inpatient Procedure:                Upper GI endoscopy Indications:              Hematemesis Providers:                Milus Banister, MD, Carlyn Reichert, RN, Marguerita Merles, Technician Referring MD:              Medicines:                Intubated in ICU, on fentanyl drip Complications:            No immediate complications. Estimated blood loss:                            Minimal. Estimated Blood Loss:     Estimated blood loss: none. Procedure:                Pre-Anesthesia Assessment:                           - Prior to the procedure, a History and Physical                            was performed, and patient medications and                            allergies were reviewed. The patient's tolerance of                            previous anesthesia was also reviewed. The risks                            and benefits of the procedure and the sedation                            options and risks were discussed with the patient.                            All questions were answered, and informed consent                            was obtained. Prior Anticoagulants: The patient has                            taken Plavix (clopidogrel), last dose was day of                            procedure. ASA Grade Assessment: IV - A patient  with severe systemic disease that is a constant                            threat to life. After reviewing the risks and                            benefits, the patient was deemed in satisfactory                            condition to undergo the procedure.                           After obtaining informed consent, the endoscope was                            passed under direct vision. Throughout the                             procedure, the patient's blood pressure, pulse, and                            oxygen saturations were monitored continuously. The                            [id number] Olympus gastroscope was                            introduced through the mouth, and advanced to the                            duodenal bulb. The upper GI endoscopy was                            accomplished without difficulty. The patient                            tolerated the procedure well. Scope In: Scope Out: Findings:      There was fresh blood in the mouth and coming from his nose.      Initially there was fresh red blood in the esophagus. The stomach       contained mainly coffee ground hematin, proximally. No active or recent       red blood in the stomach.      There appeared to be small to medium distal esophagus varices at the GE       junction. There was white, fibrin plug associated with one of the       trunks. No active bleeding at the varices however. I placed two variceal       ligating bands which completely flattened any remaining varices. With       observation, fresh red blood was still draining from more proximally. I       inspected the more proximal esophagus carefully and no site of bleeding       was found however there was active fresh red bleeding in his oropharynx.       I could  not tell where this was exactly bleeding from but it was clearly       not coming from the esophagus or UGI tract. Impression:               - Likely small distal esophagus varices, but                            esophageal folds could look similar. After placing                            two ligating bands it became clear that he was                            actively bleeding from somewhere 'above' his                            esophagus (orpharynx, naropharynx?). Recommendation:           - ENT evaluation.                           - Would continue octreotide drip another 24 hours                             and then OK to stop.                           - IV PPI once daily should suffice. Procedure Code(s):        --- Professional ---                           [phone], Esophagogastroduodenoscopy, flexible,                            transoral; with band ligation of esophageal/gastric                            varices Diagnosis Code(s):        --- Professional ---                           I85.00, Esophageal varices without bleeding                           K92.0, Hematemesis CPT copyright 2019 [employer]. All rights reserved. The codes documented in this report are preliminary and upon coder review may  be revised to meet current compliance requirements. Milus Banister, MD 08/22/2019 4:41:15 PM This report has been signed electronically. Number of Addenda: 0

## 2019-08-22 NOTE — Progress Notes (Signed)
Patient NPO and currently intubated without NG/OG access. Patient with bilateral nasal packing and had 2 esophageal bands placed today. On call MD notified and updated regarding lack of access and scheduled HS meds.

## 2019-08-22 NOTE — Progress Notes (Signed)
eLink Physician-Brief Progress Note Patient Name: Edward Rocha. DOB: 1965-12-31 MRN: [id number]   Date of Service  08/22/2019  HPI/Events of Note  Pt needs prn pain medications  eICU Interventions  Tylenol 650 mg po Q 4 hours prn pain        Eddison Searls U Denia Mcvicar 08/22/2019, 2:18 AM

## 2019-08-22 NOTE — Progress Notes (Signed)
53 year old male that has been in the ICU status post left lower extremity bypass with renal failure requiring CRRT.  Mental status has continued to decline.  Today he had multiple bouts of coughing up large clots and now 500 cc of bright red blood in the canister.  I have instructed them to stop his tube feeds.  We have [place] his heparin.  CCM is involved again and he is going to be intubated for airway protection.  We have consulted GI with concern for upper GI bleed.  I have updated the patient's wife.  I have been perplexed with his mental status given all of his labs have significantly improved on CRRT.  She does note that he drinks alcohol heavily and she thought he had stopped but was informed by her grandson that he was still drinking up until surgery.  Certainly raises the question of withdrawal.  [name], MD [facility] Office: [phone] Pager: [place]

## 2019-08-22 NOTE — Progress Notes (Signed)
[facility]  Subjective  - Emesis x1 last night and coughed up NG tube.  Then had BMx2.  NG replaced and tolerating NG tube feeds this am.     Objective (!) 141/79 (!) 109 99.2 F (37.3 C) (Oral) (!) 31 96%  Intake/Output Summary (Last 24 hours) at 08/22/2019 0836 Last data filed at 08/22/2019 0700 Gross per 24 hour  Intake 1563.69 ml  Output 780 ml  Net 783.69 ml    Left PT brisk signal Left groin incision c/d/i Left leg incisions c/d/i Volume overload improving Room air Abdomen soft  Laboratory Lab Results: Recent Labs    08/21/19 0646 08/22/19 0437  WBC 17.3* 25.4*  HGB 8.2* 8.1*  HCT 26.4* 25.6*  PLT 79* 75*   BMET Recent Labs    08/21/19 1709 08/22/19 0431  NA 137 138  K 4.1 4.3  CL 104 105  CO2 22 22  GLUCOSE 257* 267*  BUN 72* 81*  CREATININE 3.13* 3.32*  CALCIUM 8.1* 8.1*    COAG Lab Results  Component Value Date   INR 1.2 08/07/2019   INR 1.3 (H) 05/27/2019   No results found for: PTT  Assessment/Planning:  Postop day 11 status post left common femoral to PT bypass with composite PTFE and GSV. Continues to have a very brisk posterior tibial signal in the left foot.    Neuro: Patient can orient to time and place.  He seems to moan and complain of pain everywhere.  I was hopeful this would improve with CRRT.  His ammonia was elevated and we have started lactulose.  Not sure if some of this is related to withdrawal after discussing with nephrology yesterday. [id number] post-op.  Hgb 8.1 today.  No indication for transfusion.  More tachycardia, BP ok. Pulm: Room air.  No respiratory distress. GI: Remains NPO.  Mental status prevents swallowing safely.  NG tube with tube feeds. Renal: AKI post-op progressed to oliguric renal failure.  Tried aggressive diuresis with high dose lasix and no improvement with worsening respiratory status.  CRRT and pulled off 10 L.  On room air now.  CRRT on hold and monitoring labs.   K 4.3 today.  Cr slightly up 3.32.  UOP improved 740 past 24 hours. Has chronic thrombocytopenia that was worked up by hematology prior to surgery and previously had trial of steroids with no improvement. Platelets are 75, no active bleeding. ID: WBC up to 25 today.  Remains afebrile.  Question of aspiration overnight.  I discussed with pharmacy.  We will broaden out antibiotics to add vanc and now vanc/cefepime renally dosed.  We will get new blood cultures. FEN: Remain saline locked.  Appreciate nephrology input.   [name] 08/22/2019 8:36 AM --

## 2019-08-22 NOTE — Progress Notes (Signed)
  Speech Language Pathology Treatment: Dysphagia  Patient Details Name: Edward Rocha. MRN: [id number] DOB: 04/27/1966 Today's Date: 08/22/2019 Time: [id number] SLP Time Calculation (min) (ACUTE ONLY): 32 min  Assessment / Plan / Recommendation Clinical Impression  Treatment primarily focused on oral care with small amounts of water administered to facilitate clearance of his oral cavity. He has a thick layer of dried blood adhered to his hard and soft palate; his tongue is also coated, but with a layer that is less thick. RN reports already performing oral care twice this morning. SLP used swabs, suction, spoons, and small amounts of water to remove blood from his tongue and the majority of his hard palate. Pt was resistive to anything going into his mouth, pushing against everything with his tongue and therefore making it very challenging to get to the blood/secretions on his soft palate and the most posterior portion of his hard palate. Given the condition of his oral cavity and altered mentation, he is still not appropriate for a PO diet. I think if we can get his oral hygiene better managed, he may be appropriate for small amounts of ice/water if mentation allows in order to keep his oral cavity better hydrated moving forward. For now, would keep NPO with frequent and thorough oral care.    HPI HPI: Pt is a 53 yo who presented on 10/12 for L common femoral to posterior tibial composite bypass with PTFE and saphenous vein. Hospital course was complicated by AKI with CRRT started 10/18, acute respiratory failure, and AMS. PMH includes: DM, HTN, HLD, thrombocytpenia, and PAD s/p bilateral common iliac stenting on 4/9      SLP Plan  Continue with current plan of care       Recommendations  Diet recommendations: NPO Medication Administration: Via alternative means                Oral Care Recommendations: Oral care QID Follow up Recommendations: Skilled Nursing facility SLP Visit  Diagnosis: Dysphagia, oropharyngeal phase (R13.12) Plan: Continue with current plan of care       GO                Edward Rocha Edward Rocha 08/22/2019, 9:44 AM  Edward Rocha, M.A. [place] Acute Environmental education officer [phone] Office [phone]

## 2019-08-22 NOTE — Consult Note (Signed)
Reason for Consult: Upper airway hemorrhage Referring Physician: [name], MD  Edward Rocha. is an 53 y.o. male.  HPI: Edward Rocha to evaluate for some unknown source upper airway hemorrhage.  He had an attempted nasogastric feeding tube placement earlier today.  Sometime after that he started having hemoptysis and/or hematemesis.  He underwent upper endoscopy from GI and no abnormality was identified in the esophagus or stomach.  He has since been intubated to protect his airway.  He has had issues with thrombocytopenia in the past and his INR is 1.5 today presumably due to liver disease.  Past Medical History:  Diagnosis Date  . Acute renal failure (ARF) ([facility])    12/2018 when admitted for hyperosmolar hyperglycemic nonketotic syndrome  . Bilateral pain of leg and foot   . Diabetes mellitus without complication ([facility])    Type II  . Dizziness   . Elevated LFTs 05/2019  . Frequent urination   . Hyperglycemia    Non-Ketotic Hyperosmolar.  . Hyperlipidemia   . Hypernatremia   . Hypertension   . Peripheral vascular disease ([place])   . Syncope    12/2018, diagnosed with hyperosmolar hyperglycemic nonketotic syndrome, AKI, hyponatremia ([facility])  . Thrombocytopenia ([place])   . Thyroid disease    Hypothyroidism  . Weakness     Past Surgical History:  Procedure Laterality Date  . ABDOMINAL AORTOGRAM W/LOWER EXTREMITY N/A 02/06/2019   Procedure: ABDOMINAL AORTOGRAM W/LOWER EXTREMITY;  Surgeon: [name], MD;  Location: [facility];  Service: Cardiovascular;  Laterality: N/A;  bilateral  . ABDOMINAL AORTOGRAM W/LOWER EXTREMITY N/A 04/30/2019   Procedure: ABDOMINAL AORTOGRAM W/LOWER EXTREMITY;  Surgeon: [name], MD;  Location: [facility];  Service: Cardiovascular;  Laterality: N/A;  . AORTOGRAM  08/11/2019   Procedure: Aortogram;  Surgeon: [name], MD;  Location: [facility];  Service: Vascular;;  . FEMORAL-POPLITEAL BYPASS GRAFT Left  08/11/2019   Procedure: BYPASS LEFT FEMORAL-DISTAL POPLITEAL ARTERY USING PROPATEN GRAFT;  Surgeon: [name], MD;  Location: [facility];  Service: Vascular;  Laterality: Left;  Marland Kitchen MULTIPLE TOOTH EXTRACTIONS    . PERIPHERAL VASCULAR INTERVENTION  02/06/2019   Procedure: PERIPHERAL VASCULAR INTERVENTION;  Surgeon: [name], MD;  Location: [facility];  Service: Cardiovascular;;  Bilateral Iliacs  . PERIPHERAL VASCULAR INTERVENTION  04/30/2019   Procedure: PERIPHERAL VASCULAR INTERVENTION;  Surgeon: [name], MD;  Location: [facility];  Service: Cardiovascular;;  Stent - Lt. Iliac   . VASCULAR SURGERY      Family History  Problem Relation Age of Onset  . Alcohol abuse Father   . Cancer Mother   . Alcohol abuse Brother   . Bipolar disorder Son   . Bipolar disorder Daughter     Social History:  reports that he quit smoking about 6 months ago. His smoking use included cigarettes. He has a 36.00 pack-year smoking history. He has never used smokeless tobacco. He reports previous alcohol use. He reports that he does not use drugs.  Allergies:  Allergies  Allergen Reactions  . Other     Pt received platelets and had a bad reaction from infusion     Medications: Reviewed  Results for orders placed or performed during the hospital encounter of 08/11/19 (from the past 48 hour(s))  Glucose, capillary     Status: Abnormal   Collection Time: 08/20/19  8:07 PM  Result Value Ref Range   Glucose-Capillary 141 (H) 70 - 99  mg/dL  Glucose, capillary     Status: Abnormal   Collection Time: 08/21/19 12:48 AM  Result Value Ref Range   Glucose-Capillary 176 (H) 70 - 99 mg/dL  Glucose, capillary     Status: Abnormal   Collection Time: 08/21/19  4:20 AM  Result Value Ref Range   Glucose-Capillary 178 (H) 70 - 99 mg/dL  Renal function panel (daily at 0500)     Status: Abnormal   Collection Time: 08/21/19  5:04 AM  Result Value Ref Range   Sodium 135 135 - 145  mmol/L   Potassium 3.8 3.5 - 5.1 mmol/L   Chloride 103 98 - 111 mmol/L   CO2 21 (L) 22 - 32 mmol/L   Glucose, Bld 192 (H) 70 - 99 mg/dL   BUN 59 (H) 6 - 20 mg/dL   Creatinine, Ser 3.06 (H) 0.61 - 1.24 mg/dL    Comment: DELTA CHECK NOTED   Calcium 7.8 (L) 8.9 - 10.3 mg/dL   Phosphorus 2.2 (L) 2.5 - 4.6 mg/dL   Albumin 1.8 (L) 3.5 - 5.0 g/dL   GFR calc non Af Amer 22 (L) >60 mL/min   GFR calc Af Amer 25 (L) >60 mL/min   Anion gap 11 5 - 15    Comment: Performed at [facility], [address], [place], [place] [address]  Magnesium     Status: Abnormal   Collection Time: 08/21/19  5:04 AM  Result Value Ref Range   Magnesium 2.8 (H) 1.7 - 2.4 mg/dL    Comment: Performed at [facility] [address], [place], [place] [address]  APTT     Status: Abnormal   Collection Time: 08/21/19  5:04 AM  Result Value Ref Range   aPTT 72 (H) 24 - 36 seconds    Comment:        IF BASELINE aPTT IS ELEVATED, SUGGEST PATIENT RISK ASSESSMENT BE USED TO DETERMINE APPROPRIATE ANTICOAGULANT THERAPY. Performed at [facility], [place] [address], [place], [place] [address]   Ammonia     Status: Abnormal   Collection Time: 08/21/19  6:46 AM  Result Value Ref Range   Ammonia 87 (H) 9 - 35 umol/L    Comment: Performed at [facility], [place] [address], [place], [place] [address]  CBC with Differential/Platelet     Status: Abnormal   Collection Time: 08/21/19  6:46 AM  Result Value Ref Range   WBC 17.3 (H) 4.0 - 10.5 K/uL   RBC 2.96 (L) 4.22 - 5.81 MIL/uL   Hemoglobin 8.2 (L) 13.0 - 17.0 g/dL   HCT 26.4 (L) 39.0 - 52.0 %   MCV 89.2 80.0 - 100.0 fL   MCH 27.7 26.0 - 34.0 pg   MCHC 31.1 30.0 - 36.0 g/dL   RDW 14.9 11.5 - 15.5 %   Platelets 79 (L) 150 - 400 K/uL    Comment: REPEATED TO VERIFY Immature Platelet Fraction may be clinically indicated, consider ordering this additional test [id number] CONSISTENT WITH PREVIOUS RESULT    nRBC 0.6 (H) 0.0 - 0.2 %   Neutrophils Relative  % 73 %   Neutro Abs 12.5 (H) 1.7 - 7.7 K/uL   Lymphocytes Relative 13 %   Lymphs Abs 2.2 0.7 - 4.0 K/uL   Monocytes Relative 9 %   Monocytes Absolute 1.6 (H) 0.1 - 1.0 K/uL   Eosinophils Relative 3 %   Eosinophils Absolute 0.5 0.0 - 0.5 K/uL   Basophils Relative 0 %  Basophils Absolute 0.1 0.0 - 0.1 K/uL   Immature Granulocytes 2 %   Abs Immature Granulocytes 0.41 (H) 0.00 - 0.07 K/uL    Comment: Performed at [facility], [place] [address] [place], [place] [address]  Glucose, capillary     Status: Abnormal   Collection Time: 08/21/19  7:46 AM  Result Value Ref Range   Glucose-Capillary 203 (H) 70 - 99 mg/dL  Glucose, capillary     Status: Abnormal   Collection Time: 08/21/19 11:39 AM  Result Value Ref Range   Glucose-Capillary 219 (H) 70 - 99 mg/dL  Glucose, capillary     Status: Abnormal   Collection Time: 08/21/19  3:51 PM  Result Value Ref Range   Glucose-Capillary 227 (H) 70 - 99 mg/dL  Renal function panel (daily at 1600)     Status: Abnormal   Collection Time: 08/21/19  5:09 PM  Result Value Ref Range   Sodium 137 135 - 145 mmol/L   Potassium 4.1 3.5 - 5.1 mmol/L   Chloride 104 98 - 111 mmol/L   CO2 22 22 - 32 mmol/L   Glucose, Bld 257 (H) 70 - 99 mg/dL   BUN 72 (H) 6 - 20 mg/dL   Creatinine, Ser 3.13 (H) 0.61 - 1.24 mg/dL   Calcium 8.1 (L) 8.9 - 10.3 mg/dL   Phosphorus 2.6 2.5 - 4.6 mg/dL   Albumin 1.9 (L) 3.5 - 5.0 g/dL   GFR calc non Af Amer 21 (L) >60 mL/min   GFR calc Af Amer 25 (L) >60 mL/min   Anion gap 11 5 - 15    Comment: Performed at [facility], [place] [address] [place], [place] [address]  Glucose, capillary     Status: Abnormal   Collection Time: 08/21/19  8:02 PM  Result Value Ref Range   Glucose-Capillary 264 (H) 70 - 99 mg/dL  Glucose, capillary     Status: Abnormal   Collection Time: 08/22/19 12:01 AM  Result Value Ref Range   Glucose-Capillary 272 (H) 70 - 99 mg/dL  Renal function panel (daily at 0500)     Status: Abnormal    Collection Time: 08/22/19  4:31 AM  Result Value Ref Range   Sodium 138 135 - 145 mmol/L   Potassium 4.3 3.5 - 5.1 mmol/L   Chloride 105 98 - 111 mmol/L   CO2 22 22 - 32 mmol/L   Glucose, Bld 267 (H) 70 - 99 mg/dL   BUN 81 (H) 6 - 20 mg/dL   Creatinine, Ser 3.32 (H) 0.61 - 1.24 mg/dL   Calcium 8.1 (L) 8.9 - 10.3 mg/dL   Phosphorus 3.0 2.5 - 4.6 mg/dL   Albumin 1.8 (L) 3.5 - 5.0 g/dL   GFR calc non Af Amer 20 (L) >60 mL/min   GFR calc Af Amer 23 (L) >60 mL/min   Anion gap 11 5 - 15    Comment: Performed at [facility] [address], [place], [place] [address]  Ammonia     Status: Abnormal   Collection Time: 08/22/19  4:31 AM  Result Value Ref Range   Ammonia 60 (H) 9 - 35 umol/L    Comment: Performed at [facility], [place] [address] [place], [place] [address]  Glucose, capillary     Status: Abnormal   Collection Time: 08/22/19  4:36 AM  Result Value Ref Range   Glucose-Capillary 246 (H) 70 - 99 mg/dL  Magnesium     Status: Abnormal  Collection Time: 08/22/19  4:37 AM  Result Value Ref Range   Magnesium 3.2 (H) 1.7 - 2.4 mg/dL    Comment: Performed at [facility] [address], [place], [place] [address]  APTT     Status: Abnormal   Collection Time: 08/22/19  4:37 AM  Result Value Ref Range   aPTT 49 (H) 24 - 36 seconds    Comment:        IF BASELINE aPTT IS ELEVATED, SUGGEST PATIENT RISK ASSESSMENT BE USED TO DETERMINE APPROPRIATE ANTICOAGULANT THERAPY. Performed at [facility], [place] [address], [place],  [address]   Hepatic function panel     Status: Abnormal   Collection Time: 08/22/19  4:37 AM  Result Value Ref Range   Total Protein 7.1 6.5 - 8.1 g/dL   Albumin 1.8 (L) 3.5 - 5.0 g/dL   AST 69 (H) 15 - 41 U/L   ALT 28 0 - 44 U/L   Alkaline Phosphatase 90 38 - 126 U/L   Total Bilirubin 2.1 (H) 0.3 - 1.2 mg/dL   Bilirubin, Direct 1.0 (H) 0.0 - 0.2 mg/dL   Indirect Bilirubin 1.1 (H) 0.3 - 0.9 mg/dL    Comment: Performed at [facility] [address] [place], [place] [address]  CBC     Status: Abnormal   Collection Time: 08/22/19  4:37 AM  Result Value Ref Range   WBC 25.4 (H) 4.0 - 10.5 K/uL   RBC 2.90 (L) 4.22 - 5.81 MIL/uL   Hemoglobin 8.1 (L) 13.0 - 17.0 g/dL   HCT 25.6 (L) 39.0 - 52.0 %   MCV 88.3 80.0 - 100.0 fL   MCH 27.9 26.0 - 34.0 pg   MCHC 31.6 30.0 - 36.0 g/dL   RDW 15.4 11.5 - 15.5 %   Platelets 75 (L) 150 - 400 K/uL    Comment: REPEATED TO VERIFY SPECIMEN CHECKED FOR CLOTS Immature Platelet Fraction may be clinically indicated, consider ordering this additional test [id number] CONSISTENT WITH PREVIOUS RESULT    nRBC 0.2 0.0 - 0.2 %    Comment: Performed at [facility], [place] [address], [place], [place] [address]  Glucose, capillary     Status: Abnormal   Collection Time: 08/22/19  8:03 AM  Result Value Ref Range   Glucose-Capillary 243 (H) 70 - 99 mg/dL  Glucose, capillary     Status: Abnormal   Collection Time: 08/22/19 11:30 AM  Result Value Ref Range   Glucose-Capillary 234 (H) 70 - 99 mg/dL  CBC with Differential/Platelet     Status: Abnormal   Collection Time: 08/22/19 12:54 PM  Result Value Ref Range   WBC 28.0 (H) 4.0 - 10.5 K/uL   RBC 2.90 (L) 4.22 - 5.81 MIL/uL   Hemoglobin 7.9 (L) 13.0 - 17.0 g/dL   HCT 25.8 (L) 39.0 - 52.0 %   MCV 89.0 80.0 - 100.0 fL   MCH 27.2 26.0 - 34.0 pg   MCHC 30.6 30.0 - 36.0 g/dL   RDW 15.8 (H) 11.5 - 15.5 %   Platelets 79 (L) 150 - 400 K/uL    Comment: REPEATED TO VERIFY Immature Platelet Fraction may be clinically indicated, consider ordering this additional test [id number] CONSISTENT WITH PREVIOUS RESULT    nRBC 0.3 (H) 0.0 - 0.2 %   Neutrophils Relative % 78 %   Neutro Abs 21.7 (H) 1.7 - 7.7 K/uL   Lymphocytes Relative 10 %   Lymphs Abs 2.7 0.7 -  4.0 K/uL   Monocytes Relative 8 %   Monocytes Absolute 2.3 (H) 0.1 - 1.0 K/uL   Eosinophils Relative 1 %   Eosinophils Absolute 0.3 0.0 - 0.5 K/uL   Basophils Relative 0 %    Basophils Absolute 0.1 0.0 - 0.1 K/uL   Immature Granulocytes 3 %   Abs Immature Granulocytes 0.92 (H) 0.00 - 0.07 K/uL   Polychromasia PRESENT     Comment: Performed at [facility] [address] [place], [place] [address]  Renal function panel (daily at 1600)     Status: Abnormal   Collection Time: 08/22/19  1:09 PM  Result Value Ref Range   Sodium 139 135 - 145 mmol/L   Potassium 4.0 3.5 - 5.1 mmol/L   Chloride 106 98 - 111 mmol/L   CO2 22 22 - 32 mmol/L   Glucose, Bld 250 (H) 70 - 99 mg/dL   BUN 92 (H) 6 - 20 mg/dL   Creatinine, Ser 3.47 (H) 0.61 - 1.24 mg/dL   Calcium 8.3 (L) 8.9 - 10.3 mg/dL   Phosphorus 3.4 2.5 - 4.6 mg/dL   Albumin 1.8 (L) 3.5 - 5.0 g/dL   GFR calc non Af Amer 19 (L) >60 mL/min   GFR calc Af Amer 22 (L) >60 mL/min   Anion gap 11 5 - 15    Comment: Performed at [facility] [address], [place], [place] [address]  Protime-INR     Status: Abnormal   Collection Time: 08/22/19  1:09 PM  Result Value Ref Range   Prothrombin Time 17.4 (H) 11.4 - 15.2 seconds   INR 1.4 (H) 0.8 - 1.2    Comment: (NOTE) INR goal varies based on device and disease states. Performed at [facility], [place] [address], [place], [place] [address]   APTT     Status: Abnormal   Collection Time: 08/22/19  1:09 PM  Result Value Ref Range   aPTT 44 (H) 24 - 36 seconds    Comment:        IF BASELINE aPTT IS ELEVATED, SUGGEST PATIENT RISK ASSESSMENT BE USED TO DETERMINE APPROPRIATE ANTICOAGULANT THERAPY. Performed at [facility], [place] [address], [place], [place] [address]   DIC (disseminated intravasc coag) panel     Status: Abnormal   Collection Time: 08/22/19  1:09 PM  Result Value Ref Range   Prothrombin Time 17.6 (H) 11.4 - 15.2 seconds   INR 1.5 (H) 0.8 - 1.2    Comment: (NOTE) INR goal varies based on device and disease states.    aPTT 44 (H) 24 - 36 seconds    Comment:        IF BASELINE aPTT IS ELEVATED, SUGGEST PATIENT RISK  ASSESSMENT BE USED TO DETERMINE APPROPRIATE ANTICOAGULANT THERAPY.    Fibrinogen 433 210 - 475 mg/dL   D-Dimer, Quant 3.63 (H) 0.00 - 0.50 ug/mL-FEU    Comment: (NOTE) At the manufacturer cut-off of 0.50 ug/mL FEU, this assay has been documented to exclude PE with a sensitivity and negative predictive value of 97 to 99%.  At this time, this assay has not been approved by the FDA to exclude DVT/VTE. Results should be correlated with clinical presentation.    Platelets 80 (L) 150 - 400 K/uL    Comment: REPEATED TO VERIFY Immature Platelet Fraction may be clinically indicated, consider ordering this additional test [id number] CONSISTENT WITH PREVIOUS RESULT    Smear Review Schistocytes present     Comment:  Performed at [facility], [place] [address] [place], [place] [address]  I-STAT 7, (LYTES, BLD GAS, ICA, H+H)     Status: Abnormal   Collection Time: 08/22/19  1:13 PM  Result Value Ref Range   pH, Arterial 7.480 (H) 7.350 - 7.450   pCO2 arterial 30.1 (L) 32.0 - 48.0 mmHg   pO2, Arterial 81.0 (L) 83.0 - 108.0 mmHg   Bicarbonate 22.4 20.0 - 28.0 mmol/L   TCO2 23 22 - 32 mmol/L   O2 Saturation 97.0 %   Acid-base deficit 1.0 0.0 - 2.0 mmol/L   Sodium 142 135 - 145 mmol/L   Potassium 4.1 3.5 - 5.1 mmol/L   Calcium, Ion 1.16 1.15 - 1.40 mmol/L   HCT 26.0 (L) 39.0 - 52.0 %   Hemoglobin 8.8 (L) 13.0 - 17.0 g/dL   Patient temperature 98.9 F    Sample type ARTERIAL   Comprehensive metabolic panel     Status: Abnormal   Collection Time: 08/22/19  3:24 PM  Result Value Ref Range   Sodium 138 135 - 145 mmol/L   Potassium 4.6 3.5 - 5.1 mmol/L   Chloride 105 98 - 111 mmol/L   CO2 23 22 - 32 mmol/L   Glucose, Bld 251 (H) 70 - 99 mg/dL   BUN 96 (H) 6 - 20 mg/dL   Creatinine, Ser 3.65 (H) 0.61 - 1.24 mg/dL   Calcium 8.1 (L) 8.9 - 10.3 mg/dL   Total Protein 7.1 6.5 - 8.1 g/dL   Albumin 1.8 (L) 3.5 - 5.0 g/dL   AST 63 (H) 15 - 41 U/L   ALT 27 0 - 44 U/L   Alkaline Phosphatase 88  38 - 126 U/L   Total Bilirubin 2.0 (H) 0.3 - 1.2 mg/dL   GFR calc non Af Amer 18 (L) >60 mL/min   GFR calc Af Amer 20 (L) >60 mL/min   Anion gap 10 5 - 15    Comment: Performed at [facility], [place] [address] [place], [place] [address]  Magnesium     Status: Abnormal   Collection Time: 08/22/19  3:24 PM  Result Value Ref Range   Magnesium 3.5 (H) 1.7 - 2.4 mg/dL    Comment: Performed at [facility] [address], [place],  [address]  Phosphorus     Status: Abnormal   Collection Time: 08/22/19  3:24 PM  Result Value Ref Range   Phosphorus 4.8 (H) 2.5 - 4.6 mg/dL    Comment: Performed at [facility] [address] [place], [place] [address]  CBC     Status: Abnormal   Collection Time: 08/22/19  3:24 PM  Result Value Ref Range   WBC 28.7 (H) 4.0 - 10.5 K/uL   RBC 2.70 (L) 4.22 - 5.81 MIL/uL   Hemoglobin 7.6 (L) 13.0 - 17.0 g/dL   HCT 24.0 (L) 39.0 - 52.0 %   MCV 88.9 80.0 - 100.0 fL   MCH 28.1 26.0 - 34.0 pg   MCHC 31.7 30.0 - 36.0 g/dL   RDW 15.9 (H) 11.5 - 15.5 %   Platelets 79 (L) 150 - 400 K/uL    Comment: REPEATED TO VERIFY PLATELET COUNT CONFIRMED BY SMEAR Immature Platelet Fraction may be clinically indicated, consider ordering this additional test [id number]    nRBC 0.1 0.0 - 0.2 %    Comment: Performed at [facility], [place] [address] [place], [place] [address]  Lactic acid, plasma  Status: Abnormal   Collection Time: 08/22/19  3:24 PM  Result Value Ref Range   Lactic Acid, Venous 2.0 (HH) 0.5 - 1.9 mmol/L    Comment: CRITICAL RESULT CALLED TO, READ BACK BY AND VERIFIED WITH: Mindi Junker 1615 08/22/2019 D BRADLEY Performed at [facility], [place] [address], [place], [place] [address]   Type and screen [place]     Status: None   Collection Time: 08/22/19  3:24 PM  Result Value Ref Range   ABO/RH(D) O POS    Antibody Screen NEG    Sample Expiration      08/25/2019,2359 Performed at [facility], [place] [address] [place], [place] [address]   Glucose, capillary     Status: Abnormal   Collection Time: 08/22/19  4:22 PM  Result Value Ref Range   Glucose-Capillary 246 (H) 70 - 99 mg/dL  I-STAT 7, (LYTES, BLD GAS, ICA, H+H)     Status: Abnormal   Collection Time: 08/22/19  4:44 PM  Result Value Ref Range   pH, Arterial 7.345 (L) 7.350 - 7.450   pCO2 arterial 43.6 32.0 - 48.0 mmHg   pO2, Arterial 273.0 (H) 83.0 - 108.0 mmHg   Bicarbonate 23.7 20.0 - 28.0 mmol/L   TCO2 25 22 - 32 mmol/L   O2 Saturation 100.0 %   Acid-base deficit 2.0 0.0 - 2.0 mmol/L   Sodium 141 135 - 145 mmol/L   Potassium 4.8 3.5 - 5.1 mmol/L   Calcium, Ion 1.14 (L) 1.15 - 1.40 mmol/L   HCT 29.0 (L) 39.0 - 52.0 %   Hemoglobin 9.9 (L) 13.0 - 17.0 g/dL   Patient temperature 99.4 F    Collection site RADIAL, ALLEN'S TEST ACCEPTABLE    Drawn by RT    Sample type ARTERIAL     Portable Chest X-[place]ifert  Result Date: 08/22/2019 CLINICAL DATA:  Hemoptysis.  Post intubation. EXAM: PORTABLE CHEST 1 VIEW COMPARISON:  Chest x-[place]elock from same day. FINDINGS: Interval placement of an endotracheal tube with the tip 3.1 cm above the carina. Unchanged feeding tube tip in the gastric fundus. Interval placement of a second enteric tube with the tube looped back on itself in the tip in the mid esophagus. Unchanged right internal jugular central venous catheter. The heart size and mediastinal contours are within normal limits. Diffuse central peribronchial thickening. Increasing density at the right lung base with new small right pleural effusion. No pneumothorax. No acute osseous abnormality. IMPRESSION: 1. Appropriately positioned endotracheal tube. 2. New enteric tube coiled in the esophagus. Recommend repositioning. 3. Diffuse central peribronchial thickening with increasing right basilar opacity that could reflect aspiration. 4. New small right pleural effusion. Electronically Signed   By: Titus Dubin M.D.   On: 08/22/2019  14:39   Dg Chest Port 1 View  Result Date: 08/22/2019 CLINICAL DATA:  Status post feeding tube placement today. EXAM: PORTABLE CHEST 1 VIEW COMPARISON:  Single-view of the chest 08/19/2019. FINDINGS: New feeding tube is in place with its tip in the fundus of the stomach. Right IJ catheter tip projects at the superior cavoatrial junction. Lungs are clear. No pneumothorax or pleural effusion. Aortic atherosclerosis noted. Remote upper right rib fractures are noted IMPRESSION: Feeding tube tip is in the fundus of the stomach. Right IJ catheter tip projects at the superior cavoatrial junction. Lungs clear.  No pneumothorax. Electronically Signed   By: Inge Rise M.D.   On: 08/22/2019 13:12   Dg Abd Portable 1v  Result Date: 08/22/2019 CLINICAL DATA:  New enteric tube placement. EXAM: PORTABLE ABDOMEN - 1 VIEW COMPARISON:  Abdominal x-Guyett from same day. FINDINGS: Interval exchange of the enteric tube for a new feeding tube in the stomach. The bowel gas pattern is normal. No radio-opaque calculi or other significant radiographic abnormality are seen. Prior iliac stents. No acute osseous abnormality. Unchanged small calcification in the right upper quadrant corresponding to a gallstone based on prior CT. IMPRESSION: Feeding tube in the stomach. Electronically Signed   By: Titus Dubin M.D.   On: 08/22/2019 14:41   Dg Abd Portable 1v  Result Date: 08/22/2019 CLINICAL DATA:  Orogastric tube placement. EXAM: PORTABLE ABDOMEN - 1 VIEW COMPARISON:  Radiograph 08/20/2019 FINDINGS: Tip and side port of the enteric tube below the diaphragm in the stomach. Nonobstructive bowel gas pattern. Moderate stool burden in the transverse colon. Central line catheter tip in the SVC. IMPRESSION: Tip and side port of the enteric tube below the diaphragm in the stomach. Electronically Signed   By: Keith Rake M.D.   On: 08/22/2019 02:05   Dg Abd Portable 1v  Result Date: 08/20/2019 CLINICAL DATA:  NG tube  placement EXAM: PORTABLE ABDOMEN - 1 VIEW COMPARISON:  08/20/2019 FINDINGS: Esophageal tube tip is in the left upper quadrant over the gastric fundus. Gas pattern is nonobstructed IMPRESSION: Esophageal tube tip in the left upper quadrant overlying the gastric fundus. Electronically Signed   By: Donavan Foil M.D.   On: 08/20/2019 17:55    [id number] except as listed in admit H&P  Blood pressure 97/69, pulse (!) 118, temperature 99.4 F (37.4 C), temperature source Oral, resp. rate 20, height 6\' 1"  (1.854 m), weight 88.9 kg, SpO2 100 %.  PHYSICAL EXAM: Overall appearance: Orally intubated and sedated.  Active bloody discharge from the oral cavity and slight amount from the nose as well on both sides. Head:  Normocephalic, atraumatic. Ears: External ears look healthy. Nose: External nose is healthy in appearance. Internal nasal exam with large clots bilaterally that were cleaned out using suction and forceps.  A possible bleeding site was identified in the left anterior/superior septum.  This was packed with Surgicel.  No other nasal bleeding sites were identified.  Slimline Merocel pack was placed on the left as well.  No further bleeding was identified from the nasal cavities. Oral Cavity/Pharynx:  There are no mucosal lesions or masses identified.  Clots evacuated from the oropharynx and nasopharynx.  There is a slight mucosal tear along the left side oropharynx approximately the anterior faucil arch.  This was oozing blood.  This was cauterized with silver nitrate extensively and there is no further bleeding after that from this site.  With the left side oropharynx cauterized and the nasal cavity packed, there is still some bloody oozing down from the nasopharynx.  Fiberoptic endoscopy was performed through the right side of the nose.  There was oozing from the nasopharynx but I could not identify a specific site. Larynx/Hypopharynx: Deferred Neuro: Intubated and sedated. Neck: No palpable neck  masses.  Studies Reviewed: none  Procedures: A 30 cc Foley catheter was inserted into the right side of the nose and passed all the way into the oropharynx.  Direct visualization was used to confirm the position of the balloon.  The balloon was then inflated with 30 cc of saline.  It was then pulled back into the nasopharynx and secured with a clamp.  After this maneuver there was no further bleeding identified.   Assessment/Plan:  Probable nasopharyngeal bleed, possibly from the nasogastric feeding tube placement earlier today.  Bleeding site in the oropharynx also cauterized with silver nitrate.  Left nasal cavity packed.  Posterior nasopharyngeal balloon pack placed.  No further bleeding.  Plan is to keep the balloon in place for 48 hours and then reevaluate.  Contact me sooner if there is additional bleeding.  Izora Gala 08/22/2019, 5:41 PM

## 2019-08-22 NOTE — Plan of Care (Signed)
  Problem: Clinical Measurements: Goal: Ability to maintain clinical measurements within normal limits will improve Outcome: Progressing Goal: Will remain free from infection Outcome: Progressing Goal: Cardiovascular complication will be avoided Outcome: Progressing   Problem: Nutrition: Goal: Adequate nutrition will be maintained Outcome: Progressing   Problem: Elimination: Goal: Will not experience complications related to bowel motility Outcome: Progressing Goal: Will not experience complications related to urinary retention Outcome: Progressing   Problem: Safety: Goal: Ability to remain free from injury will improve Outcome: Progressing   Problem: Skin Integrity: Goal: Risk for impaired skin integrity will decrease Outcome: Progressing   Problem: Activity: Goal: Activity intolerance will improve Outcome: Progressing   Problem: Nutritional: Goal: Ability to make appropriate dietary choices will improve Outcome: Progressing   Problem: Urinary Elimination: Goal: Progression of disease will be identified and treated Outcome: Progressing

## 2019-08-22 NOTE — Progress Notes (Addendum)
NAME:  Edward Koh., MRN:  [id number], DOB:  02-Jun-1966, LOS: 5 ADMISSION DATE:  08/11/2019, CONSULTATION DATE:  08/17/19, CHIEF COMPLAINT:  SOB  Brief History    53 yo with history of DM, HTN, HLD, thrombocytopenia, and PAD s/p aortogram with left iliac arteriogram prior to left femoral to PTA bypass graft placement on 08/11/19.  Developed oliguric renal failure, CCM consulted 10/18 due to worsening hypoxia (  volume overload vs pneumonia) . Pt was treated with CVVHD x 2 days 10/19-10/21. PCCM had signed off on 10/21. 10/23 PCCM were re consulted for emergent intubation in setting of vomiting BRB emesis of 600 cc's decreased mental status and concern for airway protection. Pt. Was emergently intubated by Dr. Maryclare Bean . PCCM will follow for vent management   Past Medical History  DM HTN HLD Thrombocytopenia PAD AKI  Significant Hospital Events   PTA on 08/11/19  Consults:  PCCM Nephro Surgery  Procedures:  RIJ HD cath 10/18 >>  Significant Diagnostic Tests:  10/14  renal ultrasound >> no hnosis  Micro Data:  Covid negative. 10/18 BC >>ng 10/23 Blood 10/23 Sputum Antimicrobials:  10/16  Cefepime >> 10/23 Maxipime>> 10/23 Vanc >>  Interim history/subjective:  Re-consulted 10/23 for emergent intubation in setting of bloody emesis and inability to protect airway.  Full support ABG pending 600 cc of BRB emesis with clots GI consulted by Vascular surgery Decreased mental status   Objective   Blood pressure (!) 151/79, pulse (!) 113, temperature 99.4 F (37.4 C), temperature source Oral, resp. rate 16, height 6\' 1"  (1.854 m), weight 88.9 kg, SpO2 98 %.    FiO2 (%):  [70 %] 70 %   Intake/Output Summary (Last 24 hours) at 08/22/2019 1400 Last data filed at 08/22/2019 0700 Gross per 24 hour  Intake 1164.46 ml  Output 655 ml  Net 509.46 ml   Filed Weights   08/20/19 0500 08/21/19 0500 08/22/19 0300  Weight: 90.5 kg 88.2 kg 88.9 kg    Examination: General:  53 year old male sedated and intubated, HENT: ETT, OG tube, Cor trak, MM pink and moist, Trace JVD, NO LAD Lungs: Bilateral chest excursion, rhonchi, diminished per bases Cardiovascular: S1, S2, Regualr rate and rhythm, no rub, Murmur or gallop Abdomen: Distended nontender hypoactive bowel sounds Extremities: 1+ edema, no obvious deformities Neuro: Prior to intubation calling out to dead mother, following commands, MAE x 4 GU: Foley in place.   Chest x-Esterline 10/ 23 personally reviewed by me  shows ETT in position   Resolved Hospital Problem list   NA   Assessment & Plan:   Acute Emergent  intubation for airway protection  2/2 BRB emesis ( started early am 10/23) Plan - AGB  1 hour after intubation - Titrate oxygen and PEEP for sats of > 92% - Sedation as ordered - RASS goal of -2 - Monitor I&O - Full vent support for now ( Needs GI consult and EGD) - SBT once able ( Source of bleeding has been ID'd and treated) - CXR in am   High Risk Aspiration 2/2 blood emesis New elevation in WBC 10/23 New low grade fever Plan CXR now and in am ABX as ordered by primary team Trend WBC and fever curve Follow blood cultures and narrow antimicrobials as able Sputum Culture now Trend PCT to guide antimicrobial therapy  ? GI Bleed ? Cirrhosis Thrombocytopenia 600 cc of bright red bloody emesis  Elevated ammonia  INR  Is 1.5 BUN 92 HGB 8.8  Plan GI consulted by Dr. Carlis Abbott Most likely needs EGD Trend HGB Q 6 x 4 Trend coags Consider Vitamin K Heparin held PAS hose  Transfuse for HGB < 7 Type and cross Trend LFT's  Trend ammonia  HTN New over the last 2 days Had been hypotensive over weekend while on CVVH Plan Will add Lobetalol 5 mg prn for SBP > 150  AKI favor ischemic ATN due to hypotension + contrast insult CVVHD 10/19-10/21 Improving urine output,  Creatinine 3.47 and up trending - per renal team - Will most likely need intermittent hemodialysis - Trend   Electrolytes - replete as needed - maintain renal perfusion - MAP goal is > 65 mm Hg  Acute encephalopathy- Worsening with increased need for  pain medication over the last 2 days per nursing Per wife pt  is a heavy drinker ( drinks two 40's per day) Ammonia levels elevated ( 87) , AST 69 Responded to lactulose Plan Trend ammonia GI evaluation   Thrombocytopenia appears to be chronic, Platelets in the 70,000 range Plan Trend CBC Monitor for bleeding Transfuse as needed  For EGD now. If bleeding source can be identified and treated, possible to SBT and wean once patient is alert and able to protect airway.   Best practice:  Diet: NPO for now Pain/Anxiety/Delirium protocol (if indicated): per primary VAP protocol (if indicated):Initiated DVT prophylaxis: Heparin GI prophylaxis: Protonix Mobility: As toelrated Code Status: Full for now Family Communication:  wife 10/18 , per vascular Disposition: ICU   The patient is critically ill with multiple organ systems failure and requires high complexity decision making for assessment and support, frequent evaluation and titration of therapies, application of advanced monitoring technologies and extensive interpretation of multiple databases. Critical Care Time devoted to patient care services described in this note independent of APP/resident  time is 60 minutes  minutes.    Magdalen Spatz, MSN, AGACNP-BC [place] Pager # [phone] After 4 pm please call [phone] 08/22/2019  2:00 PM  Attending Note:  53 year old male with PAD s/p vascular procedure who was intubated then extubated.  PCCM called urgently to the ICU with patient vomiting blood and actively aspirating.  Upon evaluating patient, he is actively aspirating and in clear respiratory failure.  Patient was emergently intubated and placed on the ventilator to secure his airway.  GI was consulted.  Coags checked.  Perform ABG and  adjust vent accordingly.  Sedation as ordered.  ISS and CBG.  If becomes febrile will consider abx coverage for aspiration.  PCCM will continue to follow.  The patient is critically ill with multiple organ systems failure and requires high complexity decision making for assessment and support, frequent evaluation and titration of therapies, application of advanced monitoring technologies and extensive interpretation of multiple databases.   Critical Care Time devoted to patient care services described in this note is  32  Minutes. This time reflects time of care of this signee Dr [name]. This critical care time does not reflect procedure time, or teaching time or supervisory time of PA/NP/Med student/Med Resident etc but could involve care discussion time.  Rush Farmer, M.D. [facility]Critical Care Medicine.

## 2019-08-22 NOTE — Progress Notes (Signed)
Pt has had chronic low platelets. He was worked up by hematology, and treated with steroids with no significant improvement in platelet count. Hematology recommendation was for transfusion as needed.  Dr. Carlis Abbott transfused platelets  1 month ago.  Pt had an anaphylactic reaction to platelet transfusion at that time. Will need to consider pre medication  before any platelet transfusion in the future .  Magdalen Spatz, MSN, AGACNP-BC [place] Pager # [phone] After 4 pm please call [phone] 08/22/2019 3:34 PM

## 2019-08-22 NOTE — Progress Notes (Signed)
Physical Therapy Treatment Patient Details Name: Edward Rocha. MRN: [id number] DOB: Jan 06, 1966 Today's Date: 08/22/2019    History of Present Illness Pt is a 53 y/o male with PMH of ARF, DM 2, HTN, PVD, thrombocytopenia. S/p L common femoral to posterior tibial composite bypass with PTFE and saphenous vein. Noted R UE edema, imaging negative for DVT/SVT. PMH :ARF, DM 2, HTN, PVD, thrombocytopenia. Transferred to ICU [id number], complication of oliguric renal failure progressing to anuric renal failure with fluid overload. CRRT 10/18-10/21.    PT Comments    Patient moaning out throughout most of session, most words not intelligible. Requires Max A of 2 for rolling for peri care and for bed mobility. Able to sit EOB unsupported today for short period of time but demonstrates limited tolerance due to pain in buttocks. Pt attempting to offload butt by pushing through BUEs while sitting EOB. Follows a few 1 step commands but not consistently. Eyes remained mostly closed for session. VSS throughout. Will continue to follow.    Follow Up Recommendations  SNF     Equipment Recommendations  Rolling walker with 5" wheels;3in1 (PT)    Recommendations for Other Services       Precautions / Restrictions Precautions Precautions: Fall Precaution Comments: monitor O2 Restrictions Weight Bearing Restrictions: No    Mobility  Bed Mobility Overal bed mobility: Needs Assistance Bed Mobility: Supine to Sit;Sit to Supine;Rolling Rolling: Total assist;+2 for physical assistance   Supine to sit: Max assist;+2 for physical assistance Sit to supine: Max assist;+2 for physical assistance   General bed mobility comments: Minimal initiation with any movement, able to reach for rails with guidance, rolling to right/left for pericare as pt incontinent of stool. ASsist of 2 to get to EOB and to return to supine.  Transfers                 General transfer comment: Deferred as pt moaning in pain due  to sitting on his bottom so returned to supine.  Ambulation/Gait                 Stairs             Wheelchair Mobility    Modified Rankin (Stroke Patients Only)       Balance Overall balance assessment: Needs assistance Sitting-balance support: Feet supported;No upper extremity supported Sitting balance-Leahy Scale: Fair Sitting balance - Comments: Able to sit EOB with close Min guard for safety. Initially Min A progressing to [place], attempting to push self up from bed with UEs to offload bottom.       Standing balance comment: Deferred                            Cognition Arousal/Alertness: Lethargic Behavior During Therapy: Flat affect Overall Cognitive Status: Difficult to assess Area of Impairment: Attention                   Current Attention Level: Focused   Following Commands: Follows one step commands inconsistently;Follows one step commands with increased time     Problem Solving: Slow processing;Decreased initiation;Difficulty sequencing;Requires verbal cues;Requires tactile cues General Comments: Pt moaning throughout, yelling out for his mom. Other words not intelligible. Follows a few commands but not consistently. Eyes only partly opened.      Exercises      General Comments General comments (skin integrity, edema, etc.): VSS on RA.      Pertinent Vitals/Pain Pain  Assessment: Faces Faces Pain Scale: Hurts whole lot Pain Location: generalized  Pain Descriptors / Indicators: Discomfort;Moaning Pain Intervention(s): Repositioned;Monitored during session;Limited activity within patient's tolerance    Home Living                      Prior Function            PT Goals (current goals can now be found in the care plan section) Progress towards PT goals: Not progressing toward goals - comment(pain, arousal/cognition)    Frequency    Min 2X/week      PT Plan Current plan remains  appropriate;Frequency needs to be updated    Co-evaluation              AM-PAC PT "6 Clicks" Mobility   Outcome Measure  Help needed turning from your back to your side while in a flat bed without using bedrails?: Total Help needed moving from lying on your back to sitting on the side of a flat bed without using bedrails?: Total Help needed moving to and from a bed to a chair (including a wheelchair)?: Total Help needed standing up from a chair using your arms (e.g., wheelchair or bedside chair)?: Total Help needed to walk in hospital room?: Total Help needed climbing 3-5 steps with a railing? : Total 6 Click Score: 6    End of Session   Activity Tolerance: Patient limited by fatigue;Patient limited by pain Patient left: in bed;with call bell/phone within reach;with bed alarm set;with SCD's reapplied;Other (comment)(PRAFO boots) Nurse Communication: Mobility status;Need for lift equipment PT Visit Diagnosis: Unsteadiness on feet (R26.81);Muscle weakness (generalized) (M62.81);Pain Pain - part of body: (generalized, bottom)     Time: [id number] PT Time Calculation (min) (ACUTE ONLY): 21 min  Charges:  $Therapeutic Activity: 8-22 mins                     Wray Kearns, PT, DPT Acute Rehabilitation Services Pager [phone] Office [phone]       [place] 08/22/2019, 1:15 PM

## 2019-08-22 NOTE — Progress Notes (Signed)
Results for JSHAWN, LUNDE (MRN [id number]) as of 08/22/2019 09:51  Ref. Range 08/21/2019 15:51 08/21/2019 20:02 08/22/2019 00:01 08/22/2019 04:36 08/22/2019 08:03  Glucose-Capillary Latest Ref Range: 70 - 99 mg/dL 227 (H) 264 (H) 272 (H) 246 (H) 243 (H)  Noted that blood sugars continue to be greater than 180 mg/dl. Recommend changing Novolog SENSITIVE correction scale to every 4 hours and adding Novolog 3 units every 4 hours for tube feed coverage. Novolog 3 units would be held if tube feeding is stopped.     Harvel Ricks RN BSN CDE Diabetes Coordinator Pager: [phone]  8am-5pm

## 2019-08-22 NOTE — Progress Notes (Signed)
eLink Physician-Brief Progress Note Patient Name: Edward Rocha. DOB: [birth year] MRN: [id number]   Date of Service  08/22/2019  HPI/Events of Note  Inquiry about dose of lactulose as patient does not have oral access. Has had 4 BM today. Patient responding appropriately  eICU Interventions  May hold off on tonight's dose of lactulos     Intervention Category Major Interventions: Other:  Judd Lien 08/22/2019, 10:14 PM

## 2019-08-22 NOTE — Progress Notes (Signed)
OT Cancellation Note  Patient Details Name: Edward Rocha. MRN: [id number] DOB: [birth year]   Cancelled Treatment:    Reason Eval/Treat Not Completed: Medical issues which prohibited therapy(change in medical status, re-intubated). OT will continue to follow for medical appropriateness.  Merri Sperling Eunique Balik 08/22/2019, 3:33 PM   Hulda Humphrey OTR/L Acute Rehabilitation Services Pager: [phone] Office: [phone]

## 2019-08-22 NOTE — Consult Note (Signed)
[facility] Consult: 2:47 PM 08/22/2019  LOS: 11 days    Referring Provider: Dr Carlis Abbott  Primary Care Physician:  Neale Burly, MD Primary Gastroenterologist:  unassigned    Reason for Consultation: Blood per mouth, hemoptysis versus hematemesis.   HPI: Edward Rocha. is a 53 y.o. male.  PMH History diabetes.  Hypertension.  Hyperlipidemia.  Thrombocytopenia.  Pperipheral vascular disease, claudication.  Underwent bilateral common iliac stent placement 01/2019 and left external iliac stent placement 04/30/2019. Elevated liver tests (T bili 1.3, alkaline phosphatase 85, AST/ALT 74/44 in late July 2020) evaluated with Doppler study on 06/17/2019.  The ultrasound showed changes suggesting hepatic cirrhosis, recannulized umbilical vein otherwise but no vascular abnormality.. No mention of previous EGD or colonoscopies.  Serum H. pylori IgA antibody elevated at 21.1 and 05/29/2019.  He has now been in the hospital for 11 days following left thumb distal pop gluteal artery bypass on 10/12.  He has had a very complicated postoperative course including oliguric AKI.  CRRT started 10/22.  Acute blood loss anemia with Hb dropping to 7.4 several days ago.. s/p 3 PRBCs, 1 unit platelets.  Last transfusion occurred on 10/18.  Baseline anemia appears to be anywhere from 12-1/2-14 over the last 3 months. Hgb 8.8 this afternoon.   Today he developed hemoptysis, coughed up large clots of blood and ~700 cc of bright red blood suctioned into the canister so far.  Stools are dark, tarry.   Daily Plavix for 11 days, held this AM.   He has just now been intubated, placed on vent to protect his airway due to the bleeding and his altered mental status. For couple of days he has been hallucinating, confused and there is been question about  him withdrawing from alcohol though his last drink was prior to admission 11 days ago.  Family says he drinks at least 80 ounces of malt liquor daily. Because of his altered mental status core track tube feeding was initiated 2 days ago, these were discontinued this morning with the onset of the bleeding.  SQ heparin was discontinued today as well, last injection 5 AM today.  Had been getting oral Protonix once daily through this morning.  IV Protonix once daily just now ordered but not yet given.    Platelets recently in the 98s.  48K in late 05/2019  Past Medical History:  Diagnosis Date   Acute renal failure (ARF) ([place])    12/2018 when admitted for hyperosmolar hyperglycemic nonketotic syndrome   Bilateral pain of leg and foot    Diabetes mellitus without complication (HCC)    Type II   Dizziness    Elevated LFTs 05/2019   Frequent urination    Hyperglycemia    Non-Ketotic Hyperosmolar.   Hyperlipidemia    Hypernatremia    Hypertension    Peripheral vascular disease ([place])    Syncope    12/2018, diagnosed with hyperosmolar hyperglycemic nonketotic syndrome, AKI, hyponatremia ([facility])   Thrombocytopenia (HCC)    Thyroid disease    Hypothyroidism   Weakness  Past Surgical History:  Procedure Laterality Date   ABDOMINAL AORTOGRAM W/LOWER EXTREMITY N/A 02/06/2019   Procedure: ABDOMINAL AORTOGRAM W/LOWER EXTREMITY;  Surgeon: [name], MD;  Location: [facility];  Service: Cardiovascular;  Laterality: N/A;  bilateral   ABDOMINAL AORTOGRAM W/LOWER EXTREMITY N/A 04/30/2019   Procedure: ABDOMINAL AORTOGRAM W/LOWER EXTREMITY;  Surgeon: [name], MD;  Location: [facility];  Service: Cardiovascular;  Laterality: N/A;   AORTOGRAM  08/11/2019   Procedure: Aortogram;  Surgeon: [name], MD;  Location: [facility];  Service: Vascular;;   FEMORAL-POPLITEAL BYPASS GRAFT Left 08/11/2019   Procedure: BYPASS LEFT FEMORAL-DISTAL  POPLITEAL ARTERY USING PROPATEN GRAFT;  Surgeon: [name], MD;  Location: [facility];  Service: Vascular;  Laterality: Left;   MULTIPLE TOOTH EXTRACTIONS     PERIPHERAL VASCULAR INTERVENTION  02/06/2019   Procedure: PERIPHERAL VASCULAR INTERVENTION;  Surgeon: [name], MD;  Location: [facility];  Service: Cardiovascular;;  Bilateral Iliacs   PERIPHERAL VASCULAR INTERVENTION  04/30/2019   Procedure: PERIPHERAL VASCULAR INTERVENTION;  Surgeon: [name], MD;  Location: [facility];  Service: Cardiovascular;;  Stent - Lt. Iliac    VASCULAR SURGERY      Prior to Admission medications   Medication Sig Start Date End Date Taking? Authorizing Provider  Accu-Chek FastClix Lancets MISC TEST ONC EDAILY 05/13/19  Yes [provider]  atorvastatin (LIPITOR) 80 MG tablet Take 80 mg by mouth daily.  04/16/19  Yes [provider]  chlorthalidone (HYGROTON) 50 MG tablet Take 50 mg by mouth daily.   Yes [provider]  clopidogrel (PLAVIX) 75 MG tablet Take 75 mg by mouth daily.   Yes [provider]  diphenhydrAMINE (BENADRYL) 25 MG tablet Take 25 mg by mouth daily as needed for itching.   Yes [provider]  empagliflozin (JARDIANCE) 10 MG TABS tablet Take 10 mg by mouth daily.   Yes [provider]  gabapentin (NEURONTIN) 300 MG capsule Take 300 mg by mouth 3 (three) times daily.   Yes [provider]  glipiZIDE (GLUCOTROL) 5 MG tablet Take 5 mg by mouth 2 (two) times daily before a meal.    Yes [provider]  Insulin Glargine (BASAGLAR KWIKPEN) 100 UNIT/ML SOPN Inject 20 Units into the skin every evening.   Yes [provider]  labetalol (NORMODYNE) 300 MG tablet Take 300 mg by mouth 2 (two) times daily.   Yes [provider]  NIFEdipine (PROCARDIA XL/NIFEDICAL-XL) 90 MG 24 hr tablet Take 90 mg by mouth daily.  06/18/19  Yes [provider]  acetaminophen (TYLENOL)  500 MG tablet Take 1,000 mg by mouth daily as needed for moderate pain or headache.    [provider]    Scheduled Meds:  sodium chloride   Intravenous Once   aspirin EC  81 mg Oral Daily   atorvastatin  80 mg Oral Daily   chlorhexidine  15 mL Mouth Rinse BID   Chlorhexidine Gluconate Cloth  6 each Topical Daily   clopidogrel  75 mg Oral Daily   darbepoetin (ARANESP) injection - NON-DIALYSIS  150 mcg Subcutaneous Q Mon-1800   docusate sodium  100 mg Oral Daily   feeding supplement (PRO-STAT SUGAR FREE 64)  30 mL Per Tube BID   fentaNYL       fentaNYL (SUBLIMAZE) injection  50 mcg Intravenous Once   folic acid  1 mg Oral Daily   insulin aspart  0-9 Units  Subcutaneous TID WC   insulin glargine  20 Units Subcutaneous QPM   lactulose  30 g Per Tube TID   LORazepam  0-4 mg Intravenous Q6H   Followed by   Derrill Memo ON 08/24/2019] LORazepam  0-4 mg Intravenous Q12H   mouth rinse  15 mL Mouth Rinse q12n4p   midazolam       [START ON 08/23/2019] multivitamin  15 mL Per Tube Daily   pantoprazole (PROTONIX) IV  40 mg Intravenous Daily   sodium chloride flush  3 mL Intravenous Q12H   thiamine  100 mg Oral Daily   Or   thiamine  100 mg Intravenous Daily   Infusions:  sodium chloride 10 mL/hr at 08/21/19 1900   sodium chloride     ceFEPime (MAXIPIME) IV 2 g (08/21/19 2112)   feeding supplement (OSMOLITE 1.5 CAL) 40 mL/hr at 08/22/19 0600   fentaNYL infusion INTRAVENOUS     magnesium sulfate bolus IVPB     vancomycin     PRN Meds: sodium chloride, acetaminophen (TYLENOL) oral liquid 160 mg/5 mL, alteplase, alum & mag hydroxide-simeth, bisacodyl, bisacodyl, diphenhydrAMINE, docusate, fentaNYL, fentaNYL (SUBLIMAZE) injection, guaiFENesin-dextromethorphan, heparin, influenza vac split quadrivalent PF, labetalol, levalbuterol, lip balm, magnesium sulfate bolus IVPB, ondansetron, oxyCODONE, phenol, pneumococcal 23 valent vaccine, senna-docusate, sodium  chloride, sodium chloride flush   Allergies as of 07/31/2019   (No Known Allergies)    Family History  Problem Relation Age of Onset   Alcohol abuse Father    Cancer Mother    Alcohol abuse Brother    Bipolar disorder Son    Bipolar disorder Daughter     Social History   Socioeconomic History   Marital status: Married    Spouse name: Not on file   Number of children: 2   Years of education: Not on file   Highest education level: Not on file  Occupational History   Not on file  Social Needs   Financial resource strain: Not on file   Food insecurity    Worry: Not on file    Inability: Not on file   Transportation needs    Medical: Not on file    Non-medical: Not on file  Tobacco Use   Smoking status: Former Smoker    Packs/day: 1.00    Years: 36.00    Pack years: 36.00    Types: Cigarettes    Quit date: 01/25/2019    Years since quitting: 0.5   Smokeless tobacco: Never Used  Substance and Sexual Activity   Alcohol use: Not Currently    Frequency: Never    Comment: per pt's wife, pt is an alcoholic but just recently stopped drinking in 04/2019   Drug use: Never   Sexual activity: Not on file  Lifestyle   Physical activity    Days per week: Not on file    Minutes per session: Not on file   Stress: Not on file  Relationships   Social connections    Talks on phone: Not on file    Gets together: Not on file    Attends religious service: Not on file    Active member of club or organization: Not on file    Attends meetings of clubs or organizations: Not on file    Relationship status: Not on file   Intimate partner violence    Fear of current or ex partner: Not on file    Emotionally abused: Not on file    Physically abused: Not on file  Forced sexual activity: Not on file  Other Topics Concern   Not on file  Social History Narrative   Not on file    REVIEW OF SYSTEMS: Constitutional: Patient has been in bed pretty much since  his surgery. ENT:  No nose bleeds Pulm: Patient was intubated to protect his airway within the last hour CV:  No palpitations, no LE edema.  GU:  No hematuria, no frequency GI: See HPI Heme: History of thrombocytopenia per HPI. Transfusions: See HPI. Neuro:  No seizures. Derm:  No itching, no rash or sores.  Endocrine:  No sweats or chills.  No polyuria or dysuria Immunization: No history of any vaccinations in the epic record. Travel:  None beyond local counties in last few months.    PHYSICAL EXAM: Vital signs in last 24 hours: Vitals:   08/22/19 1350 08/22/19 1400  BP: (!) 198/32 (!) 185/84  Pulse: (!) 128 (!) 127  Resp: (!) 48 18  Temp:    SpO2: 100% 100%   Wt Readings from Last 3 Encounters:  08/22/19 88.9 kg  08/07/19 98.5 kg  07/09/19 97.3 kg    General: Critically ill looking AA M who is intubated on vent.  Nurse is suctioning bright red blood from his mouth which is also trickling out the side of his mouth. Head: No facial asymmetry or swelling. Eyes: No conjunctival pallor.  No scleral icterus. Ears: Not able to assess hearing Nose: There is some bright red blood coming out of his nasal cavity on the left where the core track feeding tube is in place. Mouth: Blood dripping out the side of his mouth Neck: No JVD, no masses Lungs: Clear to auscultation in front.  No breathing above the vent. Heart: RRR.  No MRG. Abdomen: Somewhat distended and tense.  Not eliciting any tenderness with palpation but he is sedated.  Bowel sounds hypoactive..   Rectal: No DRE.  There is liquid dark brown stool pooling between his legs.  This is rapidly FOBT positive Musc/Skeltl: No gross joint deformities or swelling. Extremities: Lower extremity edema, 2+ pitting. Neurologic: Sedated  Intake/Output from previous day: 10/22 0701 - 10/23 0700 In: 1672.9 [I.V.:282.9; NG/GT:1290; IV Piggyback:100] Out: 840 [Urine:740; Emesis/NG output:100] Intake/Output this shift: Total I/O In:  -  Out: 125 [Urine:125]  LAB RESULTS: Recent Labs    08/21/19 0646 08/22/19 0437 08/22/19 1254 08/22/19 1309 08/22/19 1313  WBC 17.3* 25.4* 28.0*  --   --   HGB 8.2* 8.1* 7.9*  --  8.8*  HCT 26.4* 25.6* 25.8*  --  26.0*  PLT 79* 75* 79* PENDING  --    BMET Lab Results  Component Value Date   NA 142 08/22/2019   NA 139 08/22/2019   NA 138 08/22/2019   K 4.1 08/22/2019   K 4.0 08/22/2019   K 4.3 08/22/2019   CL 106 08/22/2019   CL 105 08/22/2019   CL 104 08/21/2019   CO2 22 08/22/2019   CO2 22 08/22/2019   CO2 22 08/21/2019   GLUCOSE 250 (H) 08/22/2019   GLUCOSE 267 (H) 08/22/2019   GLUCOSE 257 (H) 08/21/2019   BUN 92 (H) 08/22/2019   BUN 81 (H) 08/22/2019   BUN 72 (H) 08/21/2019   CREATININE 3.47 (H) 08/22/2019   CREATININE 3.32 (H) 08/22/2019   CREATININE 3.13 (H) 08/21/2019   CALCIUM 8.3 (L) 08/22/2019   CALCIUM 8.1 (L) 08/22/2019   CALCIUM 8.1 (L) 08/21/2019   LFT Recent Labs    08/22/19 0431 08/22/19  [id number] 08/22/19 1309  PROT  --  7.1  --   ALBUMIN 1.8* 1.8* 1.8*  AST  --  69*  --   ALT  --  28  --   ALKPHOS  --  90  --   BILITOT  --  2.1*  --   BILIDIR  --  1.0*  --   IBILI  --  1.1*  --    PT/INR Lab Results  Component Value Date   INR 1.4 (H) 08/22/2019   INR 1.5 (H) 08/22/2019   INR 1.2 08/07/2019   Hepatitis Panel No results for input(s): HEPBSAG, HCVAB, HEPAIGM, HEPBIGM in the last 72 hours. C-Diff No components found for: CDIFF Lipase  No results found for: LIPASE  Drugs of Abuse  No results found for: LABOPIA, COCAINSCRNUR, LABBENZ, AMPHETMU, THCU, LABBARB   RADIOLOGY STUDIES: Portable Chest X-Borchardt  Result Date: 08/22/2019 CLINICAL DATA:  Hemoptysis.  Post intubation. EXAM: PORTABLE CHEST 1 VIEW COMPARISON:  Chest x-Ickes from same day. FINDINGS: Interval placement of an endotracheal tube with the tip 3.1 cm above the carina. Unchanged feeding tube tip in the gastric fundus. Interval placement of a second enteric tube with the  tube looped back on itself in the tip in the mid esophagus. Unchanged right internal jugular central venous catheter. The heart size and mediastinal contours are within normal limits. Diffuse central peribronchial thickening. Increasing density at the right lung base with new small right pleural effusion. No pneumothorax. No acute osseous abnormality. IMPRESSION: 1. Appropriately positioned endotracheal tube. 2. New enteric tube coiled in the esophagus. Recommend repositioning. 3. Diffuse central peribronchial thickening with increasing right basilar opacity that could reflect aspiration. 4. New small right pleural effusion. Electronically Signed   By: Titus Dubin M.D.   On: 08/22/2019 14:39   Dg Chest Port 1 View  Result Date: 08/22/2019 CLINICAL DATA:  Status post feeding tube placement today. EXAM: PORTABLE CHEST 1 VIEW COMPARISON:  Single-view of the chest 08/19/2019. FINDINGS: New feeding tube is in place with its tip in the fundus of the stomach. Right IJ catheter tip projects at the superior cavoatrial junction. Lungs are clear. No pneumothorax or pleural effusion. Aortic atherosclerosis noted. Remote upper right rib fractures are noted IMPRESSION: Feeding tube tip is in the fundus of the stomach. Right IJ catheter tip projects at the superior cavoatrial junction. Lungs clear.  No pneumothorax. Electronically Signed   By: Inge Rise M.D.   On: 08/22/2019 13:12   Dg Abd Portable 1v  Result Date: 08/22/2019 CLINICAL DATA:  New enteric tube placement. EXAM: PORTABLE ABDOMEN - 1 VIEW COMPARISON:  Abdominal x-Schaad from same day. FINDINGS: Interval exchange of the enteric tube for a new feeding tube in the stomach. The bowel gas pattern is normal. No radio-opaque calculi or other significant radiographic abnormality are seen. Prior iliac stents. No acute osseous abnormality. Unchanged small calcification in the right upper quadrant corresponding to a gallstone based on prior CT. IMPRESSION:  Feeding tube in the stomach. Electronically Signed   By: Titus Dubin M.D.   On: 08/22/2019 14:41   Dg Abd Portable 1v  Result Date: 08/22/2019 CLINICAL DATA:  Orogastric tube placement. EXAM: PORTABLE ABDOMEN - 1 VIEW COMPARISON:  Radiograph 08/20/2019 FINDINGS: Tip and side port of the enteric tube below the diaphragm in the stomach. Nonobstructive bowel gas pattern. Moderate stool burden in the transverse colon. Central line catheter tip in the SVC. IMPRESSION: Tip and side port of the enteric tube below the diaphragm in  the stomach. Electronically Signed   By: Keith Rake M.D.   On: 08/22/2019 02:05   Dg Abd Portable 1v  Result Date: 08/20/2019 CLINICAL DATA:  NG tube placement EXAM: PORTABLE ABDOMEN - 1 VIEW COMPARISON:  08/20/2019 FINDINGS: Esophageal tube tip is in the left upper quadrant over the gastric fundus. Gas pattern is nonobstructed IMPRESSION: Esophageal tube tip in the left upper quadrant overlying the gastric fundus. Electronically Signed   By: Donavan Foil M.D.   On: 08/20/2019 17:55     IMPRESSION:   *     GI bleed consisting of bleeding from the mouth, hard to say the source of this but given the melena on exam, this is likely upper GI bleed. Pt may have cirrhosis of the liver.  Hx thrombocytopenia, transaminitis pattern consistent with known alcohol abuse.   INR is 1.5  *    Peripheral vascular disease.  Status post femoropopliteal bypass graft 11 days ago.  Chronic Plavix, last dose was yesterday 10/22.  *    Oliguric renal failure.  Started CRRT yesterday.  *     Acute on chronic anemia.  Received 3 units of PRBCs during course of hospitalization, none today.  Hb 8.1 this morning, currently 8.8.  PLAN:     *   EGD in next hour.   Stop Plavix.    Every 8 hours CBC, transfuse as needed.  Octreotide and PPI drip ordered.     Azucena Freed  08/22/2019, 2:47 PM Phone [phone]

## 2019-08-22 NOTE — Progress Notes (Signed)
Pharmacy Antibiotic Note  Edward Rocha. is a 53 y.o. male admitted on 08/11/2019 s/p L femoral bypass.  10/16 increased SOB,  Cxr multifocal pna, WBC 13.7> increased now to 25, previously afebrile now with low grade fever 99.4, PCT 1 Cr increased 3.3 after CRT stoppped.   Pharmacy has been consulted to add Vancomycin to  Cefepime dosing.    Plan:  cefepime to 2g IV q24h now off CRT Vancomycin 1250mg  q24h    Height: 6\' 1"  (185.4 cm) Weight: 195 lb 15.8 oz (88.9 kg) IBW/kg (Calculated) : 79.9  Temp (24hrs), Avg:99.1 F (37.3 C), Min:98.7 F (37.1 C), Max:99.4 F (37.4 C)  Recent Labs  Lab 08/18/19 0333  08/19/19 0721  08/20/19 0354 08/20/19 1600 08/21/19 0504 08/21/19 0646 08/21/19 1709 08/22/19 0431 08/22/19 0437  WBC 13.1*  --  13.7*  --  15.7*  --   --  17.3*  --   --  25.4*  CREATININE 4.04*   < > 2.22*   < > 2.01* 2.04* 3.06*  --  3.13* 3.32*  --    < > = values in this interval not displayed.    Estimated Creatinine Clearance: 28.4 mL/min (A) (by C-G formula based on SCr of 3.32 mg/dL (H)).    Allergies  Allergen Reactions  . Other     Pt received platelets and had a bad reaction from infusion     Antimicrobials this admission:  Dose adjustments this admission:   Microbiology results:    [name] Pharm.D. CPP, BCPS Clinical Pharmacist [phone] 08/22/2019 11:55 AM

## 2019-08-22 NOTE — NC FL2 (Signed)
[place] LEVEL OF CARE SCREENING TOOL     IDENTIFICATION  Patient Name: Edward Rocha. Birthdate: 1966-09-17 Sex: male Admission Date (Current Location): 08/11/2019  [facility] and [place] Number:  Herbalist and Address:  [facility] [place], [place] [address] [place], [place] [address]      Provider Number: [id number]  Attending Physician Name and Address:  [name], MD  Relative Name and Phone Number:  Jakim Mazzola, [phone]    Current Level of Care: Hospital Recommended Level of Care: [place] Prior Approval Number:    Date Approved/Denied:   PASRR Number: [id number]  Discharge Plan: SNF    Current Diagnoses: Patient Active Problem List   Diagnosis Date Noted  . PAD (peripheral artery disease) ([place]) 08/11/2019  . Thrombocytopenia ([place]) 05/29/2019  . Diabetes ([place]) 05/29/2019  . Critical lower limb ischemia 04/22/2019    Orientation RESPIRATION BLADDER Height & Weight     Self, Time, Situation, Place  Normal Continent Weight: 195 lb 15.8 oz (88.9 kg) Height:  6\' 1"  (185.4 cm)  BEHAVIORAL SYMPTOMS/MOOD NEUROLOGICAL BOWEL NUTRITION STATUS      Incontinent Diet, NG/panda  AMBULATORY STATUS COMMUNICATION OF NEEDS Skin   Extensive Assist Verbally Surgical wounds(closed incision left leg, guaze dressing, change daily)                       Personal Care Assistance Level of Assistance  Dressing, Bathing, Feeding Bathing Assistance: Maximum assistance Feeding assistance: Limited assistance Dressing Assistance: Maximum assistance     Functional Limitations Info  Sight, Hearing, Speech Sight Info: Adequate Hearing Info: Adequate Speech Info: Adequate    SPECIAL CARE FACTORS FREQUENCY  PT (By licensed PT), OT (By licensed OT)     PT Frequency: 5x OT Frequency: 5x            Contractures Contractures Info: Not present    Additional Factors Info  Code Status, Allergies Code  Status Info: Full Code Allergies Info: other-per chart           Current Medications (08/22/2019):  This is the current hospital active medication list Current Facility-Administered Medications  Medication Dose Route Frequency Provider Last Rate Last Dose  . 0.9 %  sodium chloride infusion (Manually program via Guardrails IV Fluids)   Intravenous Once Inda Coke, CRNA      . 0.9 %  sodium chloride infusion   Intravenous Continuous Donato Heinz, MD 10 mL/hr at 08/21/19 1900    . 0.9 %  sodium chloride infusion  250 mL Intravenous PRN Donato Heinz, MD      . acetaminophen (TYLENOL) solution 650 mg  650 mg Per Tube Q4H PRN Frederik Pear, MD   650 mg at 08/22/19 0249  . alteplase (CATHFLO ACTIVASE) injection 2 mg  2 mg Intracatheter Once PRN Roney Jaffe, MD      . alum & mag hydroxide-simeth (MAALOX/MYLANTA) 200-200-20 MG/5ML suspension 15-30 mL  15-30 mL Oral Q2H PRN Laurence Slate M, PA-C      . aspirin EC tablet 81 mg  81 mg Oral Daily [name], MD   81 mg at 08/22/19 [id number]  . atorvastatin (LIPITOR) tablet 80 mg  80 mg Oral Daily Laurence Slate M, PA-C   80 mg at 08/22/19 [id number]  . bisacodyl (DULCOLAX) EC tablet 5 mg  5 mg Oral Daily PRN Ulyses Amor, PA-C   5 mg at 08/21/19 [id number]  . ceFEPIme (  MAXIPIME) 2 g in sodium chloride 0.9 % 100 mL IVPB  2 g Intravenous Q24H [name], MD 200 mL/hr at 08/21/19 2112 2 g at 08/21/19 2112  . chlorhexidine (PERIDEX) 0.12 % solution 15 mL  15 mL Mouth Rinse BID [name], MD   15 mL at 08/22/19 0835  . Chlorhexidine Gluconate Cloth 2 % PADS 6 each  6 each Topical Daily [name], MD   6 each at 08/22/19 1157  . clopidogrel (PLAVIX) tablet 75 mg  75 mg Oral Daily Laurence Slate M, PA-C   75 mg at 08/21/19 [id number]  . Darbepoetin Alfa (ARANESP) injection 150 mcg  150 mcg Subcutaneous Q Mon-1800 Corliss Parish, MD   150 mcg at 08/18/19 1726  . diphenhydrAMINE (BENADRYL) capsule 25 mg  25 mg Oral  Daily PRN Laurence Slate M, PA-C      . docusate sodium (COLACE) capsule 100 mg  100 mg Oral Daily Laurence Slate M, PA-C   100 mg at 08/20/19 0935  . feeding supplement (OSMOLITE 1.5 CAL) liquid 1,000 mL  1,000 mL Per Tube Continuous [name], MD 40 mL/hr at 08/22/19 0600    . feeding supplement (PRO-STAT SUGAR FREE 64) liquid 30 mL  30 mL Per Tube BID [name], MD   30 mL at 08/22/19 0837  . fentaNYL (SUBLIMAZE) injection 25-50 mcg  25-50 mcg Intravenous Q4H PRN [name], MD   50 mcg at 08/21/19 2307  . guaiFENesin-dextromethorphan (ROBITUSSIN DM) 100-10 MG/5ML syrup 15 mL  15 mL Oral Q4H PRN Laurence Slate M, PA-C      . heparin injection 1,000-6,000 Units  1,000-6,000 Units CRRT PRN Roney Jaffe, MD   2,400 Units at 08/20/19 1530  . heparin injection 5,000 Units  5,000 Units Subcutaneous Q8H [name], MD   5,000 Units at 08/22/19 0502  . influenza vac split quadrivalent PF (FLUARIX) injection 0.5 mL  0.5 mL Intramuscular Prior to discharge [name], MD      . insulin aspart (novoLOG) injection 0-9 Units  0-9 Units Subcutaneous TID WC Ulyses Amor, PA-C   5 Units at 08/22/19 1156  . insulin glargine (LANTUS) injection 20 Units  20 Units Subcutaneous QPM Ulyses Amor, PA-C   20 Units at 08/21/19 1706  . lactulose (CHRONULAC) 10 GM/15ML solution 30 g  30 g Oral TID [name], MD   30 g at 08/22/19 0837  . lip balm (CARMEX) ointment   Topical PRN [name], MD      . magnesium sulfate IVPB 2 g 50 mL  2 g Intravenous Daily PRN Laurence Slate M, PA-C      . MEDLINE mouth rinse  15 mL Mouth Rinse q12n4p [name], MD   15 mL at 08/22/19 1144  . ondansetron (ZOFRAN) injection 4 mg  4 mg Intravenous Q6H PRN Laurence Slate M, PA-C      . oxyCODONE (Oxy IR/ROXICODONE) immediate release tablet 5 mg  5 mg Oral Q6H PRN [name], MD   5 mg at 08/22/19 0502  . pantoprazole (PROTONIX) EC tablet 40 mg  40  mg Oral Daily Laurence Slate M, PA-C   40 mg at 08/22/19 [id number]  . phenol (CHLORASEPTIC) mouth spray 1 spray  1 spray Mouth/Throat PRN Laurence Slate M, PA-C      . pneumococcal 23 valent vaccine (PNU-IMMUNE) injection 0.5 mL  0.5 mL Intramuscular Prior to discharge [name], MD      .  senna-docusate (Senokot-S) tablet 1 tablet  1 tablet Oral QHS PRN Ulyses Amor, PA-C   1 tablet at 08/20/19 2212  . sodium chloride 0.9 % primer fluid for CRRT   CRRT PRN Roney Jaffe, MD      . sodium chloride flush (NS) 0.9 % injection 3 mL  3 mL Intravenous Q12H Donato Heinz, MD   3 mL at 08/22/19 0840  . sodium chloride flush (NS) 0.9 % injection 3 mL  3 mL Intravenous PRN Donato Heinz, MD   3 mL at 08/21/19 2307  . vancomycin (VANCOCIN) 1,250 mg in sodium chloride 0.9 % 250 mL IVPB  1,250 mg Intravenous Q24H [name], MD         Discharge Medications: Please see discharge summary for a list of discharge medications.  Relevant Imaging Results:  Relevant Lab Results:   Additional Information SSN: [id number]  Eileen Stanford, LCSW

## 2019-08-22 NOTE — [place] Notes (Signed)
Pt started coughing up big clots of fresh blood this morning, pt O2 dropped to 80's, non rebreather applied,contacted Dr. Carlis Abbott who came to bedside to see pt, labs and CXR obtained, contacted CCM who consulted (see notes), pt was intubated, pt still has bright red blood coming from mouth and nose. Endo came to bedside and performed EDG (see notes), ENT consulted and came to bedside and perform procedure (see notes). Pt is now stable, will continue to monitor.

## 2019-08-22 NOTE — TOC Initial Note (Signed)
Transition of Care (TOC) - Initial/Assessment Note    Patient Details  Name: Edward Rocha. MRN: [id number] Date of Birth: [birth year]  Transition of Care [facility]) CM/SW Contact:    Eileen Stanford, LCSW Phone Number: 08/22/2019, 12:22 PM  Clinical Narrative:     Pt was in the middle of a procedure when CSW went to assess. Pt was yelling out. CSW spoke with pt's spouse via telephone. Pt's spouse is agreeable to SNF on behalf of pt and prefers [facility]. CSW will let SNF know. It is premature to start auth, but pt will need prior auth. Pt will also need covid prior to d/c.              Expected Discharge Plan: Skilled Nursing Facility Barriers to Discharge: Continued Medical Work up   Patient Goals and CMS Choice Patient states their goals for this hospitalization and ongoing recovery are:: "him to get better"-per spouse CMS Medicare.gov Compare Post Acute Care list provided to:: Patient Represenative (must comment)(spouse) Choice offered to / list presented to : Spouse  Expected Discharge Plan and Services Expected Discharge Plan: [place] In-house Referral: NA Discharge Planning Services: CM Consult Post Acute Care Choice: [place] Living arrangements for the past 2 months: Single Family Home                 DME Arranged: 3-N-1, [place] rolling DME Agency: [employer] Date DME Agency Contacted: 08/14/19 Time DME Agency Contacted: [phone] Representative spoke with at DME Agency: Thed[facility] HH Arranged: NA [place] Agency: Other - See comment Date [place]: 08/14/19 Time [place]: [place] Representative spoke with at [place]  Prior Living Arrangements/Services Living arrangements for the past 2 months: [place] Lives with:: Spouse Patient language and need for interpreter reviewed:: Yes Do you feel safe going back to the place where you live?: Yes      Need for Family Participation in Patient Care:  Yes (Comment) Care giver support system in place?: Yes (comment) Current home services: DME Criminal Activity/Legal Involvement Pertinent to Current Situation/Hospitalization: No - Comment as needed  Activities of Daily Living Home Assistive Devices/Equipment: CBG Meter, Eyeglasses ADL Screening (condition at time of admission) Patient's cognitive ability adequate to safely complete daily activities?: Yes Is the patient deaf or have difficulty hearing?: No Does the patient have difficulty seeing, even when wearing glasses/contacts?: No Does the patient have difficulty concentrating, remembering, or making decisions?: No Patient able to express need for assistance with ADLs?: Yes Does the patient have difficulty dressing or bathing?: No Independently performs ADLs?: Yes (appropriate for developmental age) Does the patient have difficulty walking or climbing stairs?: Yes Weakness of Legs: Both Weakness of Arms/Hands: None  Permission Sought/Granted Permission sought to share information with : Family Supports, Customer service manager    Share Information with NAME: [name]  Permission granted to share info w AGENCY: [place] granted to share info w Relationship: spouse     Emotional Assessment Appearance:: Appears stated age Attitude/Demeanor/Rapport: Unable to Assess Affect (typically observed): Unable to Assess Orientation: : Oriented to Self, Oriented to Place, Oriented to  Time, Oriented to Situation(delayed answers, moans, repetitve words) Alcohol / Substance Use: Not Applicable Psych Involvement: No (comment)  Admission diagnosis:  CLAUDICATION Patient Active Problem List   Diagnosis Date Noted  . PAD (peripheral artery disease) ([place]) 08/11/2019  . Thrombocytopenia ([place]) 05/29/2019  . Diabetes ([place]) 05/29/2019  . Critical lower limb  ischemia 04/22/2019   PCP:  Neale Burly, MD Pharmacy:   [facility] [place], [place] - [place] [place] [address] Phone: [phone] Fax: [phone]     Social Determinants of Health (SDOH) Interventions    Readmission Risk Interventions No flowsheet data found.

## 2019-08-22 NOTE — [place] Notes (Signed)
    GI consulted called for reported patient passing large blood clots in emesis.    [name] PA-C

## 2019-08-22 NOTE — [place] Notes (Signed)
Nutrition Follow-up  DOCUMENTATION CODES:   Not applicable  INTERVENTION:   Once medically cleared:  -Vital AF 1.2 @ 20 ml/hr via Cortrak  -Increase by 10 ml Q4 hours to goal rate of 65 ml/hr (1560 ml) -30 ml Prostat BID  At goal TF provides: 2072 kcals, 147 grams protein, 1265 ml free water.   NUTRITION DIAGNOSIS:   Increased nutrient needs related to acute illness(AKI requiring CRRT) as evidenced by estimated needs.  Ongoing  GOAL:   Patient will meet greater than or equal to 90% of their needs   Not meeting  MONITOR:   Diet advancement, Supplement acceptance, PO intake, Labs, Weight trends, I & O's, Skin  REASON FOR ASSESSMENT:   Rounds    ASSESSMENT:   Patient with PMH significant for HTN, HLD, DM, and PAD s/p bilateral common iliac stenting on 4/9 . Presents this admission with continued leg leg pain.   10/12- s/p L femoral PTA bypass 10/18- R IJ- start CRRT 10/21- stop CRRT  Pt re-intubated for airway protection. Cr rising, UOP picking up. Plan HD if no improvement. Gastric Cortrak placed at bedside. Observed copious amount of bleeding from mouth s/p placement (per VS, suspect GI bleed). EGD scheduled. TF held until medically cleared. Will leave recommendations.   Patient is currently intubated on ventilator support MV: 10.9 L/min Temp (24hrs), Avg:99.2 F (37.3 C), Min:99 F (37.2 C), Max:99.4 F (37.4 C)   Admission weight: 98.4 kg  Current weight: 88.9 kg   I/O: -5,932 ml since admit UOP: 740 ml x 24 hrs OGT: 100 ml x 24 hrs   Drips: Mg sulfate  Medications: aranesp, colace, folic acid, SS novolog, lantus, lactulose, MVI, thiamine, versed Labs: Cr 3.47- trending up corrected calcium: 10.1 (wdl) CBG 88-267  Diet Order:   Diet Order            Diet NPO time specified  Diet effective now              EDUCATION NEEDS:   Not appropriate for education at this time  Skin:  Skin Assessment: Skin Integrity Issues: Skin Integrity Issues::  Incisions Incisions: L leg  Last BM:  10/23  Height:   Ht Readings from Last 1 Encounters:  08/11/19 6\' 1"  (1.854 m)    Weight:   Wt Readings from Last 1 Encounters:  08/22/19 88.9 kg    Ideal Body Weight:  83.6 kg  BMI:  Body mass index is 25.86 kg/m.  Estimated Nutritional Needs:   Kcal:  2055 kcal  Protein:  130-145 grams  Fluid:  >/= 2 L/day  Mariana Single RD, LDN Clinical Nutrition Pager # - [phone]

## 2019-08-22 NOTE — Procedures (Signed)
OGT Placement By MD  OGT placed under direct laryngoscopy  Rush Farmer, M.D. [facility] Pulmonary/Critical Care Medicine.

## 2019-08-22 NOTE — Procedures (Signed)
Cortrak  Person Inserting Tube:  Analese Sovine, RD Tube Type:  Cortrak - 43 inches Tube Location:  Left nare Initial Placement:  Stomach Secured by: Bridle Technique Used to Measure Tube Placement:  Documented cm marking at nare/ corner of mouth Cortrak Secured At:  65 cm   X-Alanis is required, abdominal x-Koelzer has been ordered by the Cortrak team. Please confirm tube placement before using the Cortrak tube.   If the tube becomes dislodged please keep the tube and contact the Cortrak team at [email or web address] (password [email or web address]) for replacement.  If after hours and replacement cannot be delayed, place a NG tube and confirm placement with an abdominal x-Wolk.   Mariana Single RD, LDN Clinical Nutrition Pager # [phone]

## 2019-08-22 NOTE — [place] Notes (Signed)
NGT replaced with attempt x 1, no resistance or difficulty, pt tolerated well with reassurance. No change in sats.  Aspirated maroon gastric contents, placed to ILWS with approx 100 ml out. Called CCM and spoke with Kathlee Nations RN who will update MD. Also discussed pain control and tylenol IV orders.

## 2019-08-22 NOTE — [place] Notes (Signed)
[facility]    NEPHROLOGY PROGRESS NOTE  SUBJECTIVE: Off of CRRT since early yesterday morning.  Patient moaning in pain, not answering questions appropriately.   OBJECTIVE:  Vitals:   08/22/19 1350 08/22/19 1400  BP: (!) 198/32 (!) 185/84  Pulse: (!) 128 (!) 127  Resp: (!) 48 18  Temp:    SpO2: 100% 100%    Intake/Output Summary (Last 24 hours) at 08/22/2019 1456 Last data filed at 08/22/2019 0800 Gross per 24 hour  Intake 1164.46 ml  Output 780 ml  Net 384.46 ml      General: Moaning, confused, NAD HEENT: MMM [place] AT anicteric sclera Neck:  No JVD, no adenopathy, right IJ temp cath placed 10/18 CV:  Heart RRR  Lungs:  L/S CTA bilaterally Abd:  abd SNT/ND with normal BS GU:  Bladder non-palpable Extremities: +2 bilateral lower extremity edema Skin:  No skin rash  MEDICATIONS:  . sodium chloride   Intravenous Once  . aspirin EC  81 mg Oral Daily  . atorvastatin  80 mg Oral Daily  . chlorhexidine  15 mL Mouth Rinse BID  . Chlorhexidine Gluconate Cloth  6 each Topical Daily  . clopidogrel  75 mg Oral Daily  . darbepoetin (ARANESP) injection - NON-DIALYSIS  150 mcg Subcutaneous Q Mon-1800  . docusate sodium  100 mg Oral Daily  . feeding supplement (PRO-STAT SUGAR FREE 64)  30 mL Per Tube BID  . fentaNYL      . fentaNYL (SUBLIMAZE) injection  50 mcg Intravenous Once  . folic acid  1 mg Oral Daily  . insulin aspart  0-9 Units Subcutaneous TID WC  . insulin glargine  20 Units Subcutaneous QPM  . lactulose  30 g Per Tube TID  . LORazepam  0-4 mg Intravenous Q6H   Followed by  . [START ON 08/24/2019] LORazepam  0-4 mg Intravenous Q12H  . mouth rinse  15 mL Mouth Rinse q12n4p  . midazolam      . [START ON 08/23/2019] multivitamin  15 mL Per Tube Daily  . pantoprazole (PROTONIX) IV  40 mg Intravenous Daily  . sodium chloride flush  3 mL Intravenous Q12H  . thiamine  100 mg Oral Daily   Or  . thiamine  100 mg Intravenous Daily        LABS:   CBC Latest Ref Rng & Units 08/22/2019 08/22/2019 08/22/2019  WBC 4.0 - 10.5 K/uL - - 28.0(H)  Hemoglobin 13.0 - 17.0 g/dL 8.8(L) - 7.9(L)  Hematocrit 39.0 - 52.0 % 26.0(L) - 25.8(L)  Platelets 150 - 400 K/uL - PENDING 79(L)    CMP Latest Ref Rng & Units 08/22/2019 08/22/2019 08/22/2019  Glucose 70 - 99 mg/dL - 250(H) -  BUN 6 - 20 mg/dL - 92(H) -  Creatinine 0.61 - 1.24 mg/dL - 3.47(H) -  Sodium 135 - 145 mmol/L 142 139 -  Potassium 3.5 - 5.1 mmol/L 4.1 4.0 -  Chloride 98 - 111 mmol/L - 106 -  CO2 22 - 32 mmol/L - 22 -  Calcium 8.9 - 10.3 mg/dL - 8.3(L) -  Total Protein 6.5 - 8.1 g/dL - - 7.1  Total Bilirubin 0.3 - 1.2 mg/dL - - 2.1(H)  Alkaline Phos 38 - 126 U/L - - 90  AST 15 - 41 U/L - - 69(H)  ALT 0 - 44 U/L - - 28    Lab Results  Component Value Date   CALCIUM 8.3 (L) 08/22/2019   CAION 1.16 08/22/2019   PHOS  3.4 08/22/2019       Component Value Date/Time   COLORURINE AMBER (A) 08/13/2019 1309   APPEARANCEUR CLOUDY (A) 08/13/2019 1309   LABSPEC 1.014 08/13/2019 1309   PHURINE 5.0 08/13/2019 1309   GLUCOSEU >=500 (A) 08/13/2019 1309   HGBUR LARGE (A) 08/13/2019 1309   BILIRUBINUR NEGATIVE 08/13/2019 1309   KETONESUR NEGATIVE 08/13/2019 1309   PROTEINUR 100 (A) 08/13/2019 1309   NITRITE NEGATIVE 08/13/2019 1309   LEUKOCYTESUR NEGATIVE 08/13/2019 1309      Component Value Date/Time   PHART 7.480 (H) 08/22/2019 1313   PCO2ART 30.1 (L) 08/22/2019 1313   PO2ART 81.0 (L) 08/22/2019 1313   HCO3 22.4 08/22/2019 1313   TCO2 23 08/22/2019 1313   ACIDBASEDEF 1.0 08/22/2019 1313   O2SAT 97.0 08/22/2019 1313       Component Value Date/Time   IRON 170 05/29/2019 1446   TIBC 332 05/29/2019 1446   FERRITIN 570 (H) 05/29/2019 1446   IRONPCTSAT 51 (H) 05/29/2019 1446       ASSESSMENT/PLAN:    1.AKI (baseline crt ~1) - multifactorial in the setting of ischemic ATN with ABLA and relative hypotension, contrast nephropathy and BOO. Ultimately required  initiation of CRRT on 10/18due to profound volume overloadafter failing high doses of Lasix and metolazone.Labs and volume status greatly improved as a result of CRRT which was discontinued 10/21 at 15:30. UOP picking up. Labs are worsened, but no emergent indication for HD. Will monitor for today and re-evaluate need to resume HD tomorrow. Still hopeful for renal recovery since his baseline crt was good prior to procedure.   If BUN increases further, will plan HD. 2. Acute hypoxic respiratory failure - volume overload vs multifocal pneumonia.  Reintubated today for airway protection in the setting of concern for GI bleed.   3. ABLA - received total of 3 units since admission. Hgb 8.2 this morning. Continue ESAand transfuse as needed. 4. Hyponatremia due to hypervolemia -resolved 5. HTN -BP elevated.  Labetalol added as needed.  Agree.  6. Thrombocytopenia - chronic and worked up by hematology without etiology. HIT panel negative. On heparin. 7. DM type 2 - poorly controlled. Holding Invokana due to AKI. SSI as needed  8. Elytes - K stable; corrected calcium ok;  Phos improved. 9. Metabolic acidosis - due to acute renal failure;resolved with CRRT,    [employer], DO, FACP

## 2019-08-22 NOTE — Procedures (Signed)
Intubation Procedure Note Edward Rocha [id number] [birth year]  Procedure: Intubation Indications: Airway protection and maintenance  Procedure Details Consent: Unable to obtain consent because of emergent medical necessity. Time Out: Verified patient identification, verified procedure, site/side was marked, verified correct patient position, special equipment/implants available, medications/allergies/relevent history reviewed, required imaging and test results available.  Performed  Maximum sterile technique was used including antiseptics, gloves, hand hygiene and mask.  MAC    Evaluation Hemodynamic Status: BP stable throughout; O2 sats: stable throughout Patient's Current Condition: stable Complications: No apparent complications Patient did tolerate procedure well. Chest X-Brisk ordered to verify placement.  CXR: pending.   Edward Rocha 08/22/2019

## 2019-08-23 ENCOUNTER — Inpatient Hospital Stay (HOSPITAL_COMMUNITY): Payer: Managed Care, Other (non HMO)

## 2019-08-23 DIAGNOSIS — G9340 Encephalopathy, unspecified: Secondary | ICD-10-CM | POA: Diagnosis not present

## 2019-08-23 DIAGNOSIS — J9600 Acute respiratory failure, unspecified whether with hypoxia or hypercapnia: Secondary | ICD-10-CM

## 2019-08-23 LAB — BLOOD GAS, ARTERIAL
Acid-base deficit: 2.6 mmol/L — ABNORMAL HIGH (ref 0.0–2.0)
Bicarbonate: 21.7 mmol/L (ref 20.0–28.0)
Drawn by: 347191
FIO2: 0.4
MECHVT: 630 mL
O2 Saturation: 98.5 %
PEEP: 5 cmH2O
Patient temperature: 98.6
RATE: 18 resp/min
pCO2 arterial: 37.8 mmHg (ref 32.0–48.0)
pH, Arterial: 7.378 (ref 7.350–7.450)
pO2, Arterial: 127 mmHg — ABNORMAL HIGH (ref 83.0–108.0)

## 2019-08-23 LAB — COMPREHENSIVE METABOLIC PANEL
ALT: 30 U/L (ref 0–44)
AST: 86 U/L — ABNORMAL HIGH (ref 15–41)
Albumin: 1.7 g/dL — ABNORMAL LOW (ref 3.5–5.0)
Alkaline Phosphatase: 86 U/L (ref 38–126)
Anion gap: 13 (ref 5–15)
BUN: 111 mg/dL — ABNORMAL HIGH (ref 6–20)
CO2: 20 mmol/L — ABNORMAL LOW (ref 22–32)
Calcium: 8.2 mg/dL — ABNORMAL LOW (ref 8.9–10.3)
Chloride: 108 mmol/L (ref 98–111)
Creatinine, Ser: 5.02 mg/dL — ABNORMAL HIGH (ref 0.61–1.24)
GFR calc Af Amer: 14 mL/min — ABNORMAL LOW (ref >60)
GFR calc non Af Amer: 12 mL/min — ABNORMAL LOW (ref >60)
Glucose, Bld: 173 mg/dL — ABNORMAL HIGH (ref 70–99)
Potassium: 5.6 mmol/L — ABNORMAL HIGH (ref 3.5–5.1)
Sodium: 141 mmol/L (ref 135–145)
Total Bilirubin: 2.5 mg/dL — ABNORMAL HIGH (ref 0.3–1.2)
Total Protein: 6.6 g/dL (ref 6.5–8.1)

## 2019-08-23 LAB — CBC
HCT: 22.5 % — ABNORMAL LOW (ref 39.0–52.0)
HCT: 22.6 % — ABNORMAL LOW (ref 39.0–52.0)
HCT: 23.2 % — ABNORMAL LOW (ref 39.0–52.0)
HCT: 22.5 % — ABNORMAL LOW (ref 39.0–52.0)
Hemoglobin: 7.1 g/dL — ABNORMAL LOW (ref 13.0–17.0)
Hemoglobin: 7.1 g/dL — ABNORMAL LOW (ref 13.0–17.0)
Hemoglobin: 7 g/dL — ABNORMAL LOW (ref 13.0–17.0)
Hemoglobin: 6.9 g/dL — CL (ref 13.0–17.0)
MCH: 27.8 pg (ref 26.0–34.0)
MCH: 28.3 pg (ref 26.0–34.0)
MCH: 27.8 pg (ref 26.0–34.0)
MCH: 28.3 pg (ref 26.0–34.0)
MCHC: 31.6 g/dL (ref 30.0–36.0)
MCHC: 31.4 g/dL (ref 30.0–36.0)
MCHC: 30.2 g/dL (ref 30.0–36.0)
MCHC: 30.7 g/dL (ref 30.0–36.0)
MCV: 88.2 fL (ref 80.0–100.0)
MCV: 90 fL (ref 80.0–100.0)
MCV: 92.1 fL (ref 80.0–100.0)
MCV: 92.2 fL (ref 80.0–100.0)
Platelets: 75 10*3/uL — ABNORMAL LOW (ref 150–400)
Platelets: 85 10*3/uL — ABNORMAL LOW (ref 150–400)
Platelets: 84 10*3/uL — ABNORMAL LOW (ref 150–400)
Platelets: 86 10*3/uL — ABNORMAL LOW (ref 150–400)
RBC: 2.55 MIL/uL — ABNORMAL LOW (ref 4.22–5.81)
RBC: 2.51 MIL/uL — ABNORMAL LOW (ref 4.22–5.81)
RBC: 2.52 MIL/uL — ABNORMAL LOW (ref 4.22–5.81)
RBC: 2.44 MIL/uL — ABNORMAL LOW (ref 4.22–5.81)
RDW: 15.7 % — ABNORMAL HIGH (ref 11.5–15.5)
RDW: 16.1 % — ABNORMAL HIGH (ref 11.5–15.5)
RDW: 16.7 % — ABNORMAL HIGH (ref 11.5–15.5)
RDW: 16.8 % — ABNORMAL HIGH (ref 11.5–15.5)
WBC: 33.1 10*3/uL — ABNORMAL HIGH (ref 4.0–10.5)
WBC: 33.9 10*3/uL — ABNORMAL HIGH (ref 4.0–10.5)
WBC: 34.3 10*3/uL — ABNORMAL HIGH (ref 4.0–10.5)
WBC: 36.2 10*3/uL — ABNORMAL HIGH (ref 4.0–10.5)
nRBC: 0.1 % (ref 0.0–0.2)
nRBC: 0.2 % (ref 0.0–0.2)
nRBC: 0.3 % — ABNORMAL HIGH (ref 0.0–0.2)
nRBC: 0.2 % (ref 0.0–0.2)

## 2019-08-23 LAB — PHOSPHORUS: Phosphorus: 6.9 mg/dL — ABNORMAL HIGH (ref 2.5–4.6)

## 2019-08-23 LAB — GLUCOSE, CAPILLARY
Glucose-Capillary: 122 mg/dL — ABNORMAL HIGH (ref 70–99)
Glucose-Capillary: 137 mg/dL — ABNORMAL HIGH (ref 70–99)
Glucose-Capillary: 151 mg/dL — ABNORMAL HIGH (ref 70–99)
Glucose-Capillary: 153 mg/dL — ABNORMAL HIGH (ref 70–99)
Glucose-Capillary: 157 mg/dL — ABNORMAL HIGH (ref 70–99)
Glucose-Capillary: 169 mg/dL — ABNORMAL HIGH (ref 70–99)

## 2019-08-23 LAB — LACTIC ACID, PLASMA: Lactic Acid, Venous: 3.1 mmol/L (ref 0.5–1.9)

## 2019-08-23 LAB — APTT: aPTT: 34 seconds (ref 24–36)

## 2019-08-23 LAB — MAGNESIUM: Magnesium: 3.6 mg/dL — ABNORMAL HIGH (ref 1.7–2.4)

## 2019-08-23 LAB — PREPARE RBC (CROSSMATCH)

## 2019-08-23 LAB — PROCALCITONIN: Procalcitonin: 0.6 ng/mL

## 2019-08-23 MED ORDER — HEPARIN SODIUM (PORCINE) 1000 UNIT/ML IJ SOLN
INTRAMUSCULAR | Status: AC
  Filled 2019-08-23: qty 4

## 2019-08-23 MED ORDER — SODIUM POLYSTYRENE SULFONATE 15 GM/60ML PO SUSP
30.0000 g | Freq: Once | ORAL | Status: AC
  Administered 2019-08-23: 30 g via RECTAL
  Filled 2019-08-23: qty 120

## 2019-08-23 MED ORDER — FENTANYL CITRATE (PF) 100 MCG/2ML IJ SOLN
50.0000 ug | INTRAMUSCULAR | Status: DC | PRN
  Administered 2019-08-23: 100 ug via INTRAVENOUS
  Administered 2019-08-23: 50 ug via INTRAVENOUS
  Administered 2019-08-24 – 2019-08-26 (×5): 100 ug via INTRAVENOUS
  Filled 2019-08-23 (×8): qty 2

## 2019-08-23 MED ORDER — FOLIC ACID 5 MG/ML IJ SOLN
1.0000 mg | Freq: Every day | INTRAMUSCULAR | Status: DC
  Administered 2019-08-23 – 2019-08-31 (×8): 1 mg via INTRAVENOUS
  Filled 2019-08-23 (×10): qty 0.2

## 2019-08-23 MED ORDER — SODIUM CHLORIDE 0.9 % IV SOLN
25.0000 mg | Freq: Once | INTRAVENOUS | Status: AC
  Administered 2019-08-23: 16:00:00 25 mg via INTRAVENOUS
  Filled 2019-08-23: qty 0.5

## 2019-08-23 MED ORDER — LACTULOSE ENEMA
300.0000 mL | Freq: Two times a day (BID) | ORAL | Status: DC
  Administered 2019-08-23 – 2019-08-24 (×3): 300 mL via RECTAL
  Filled 2019-08-23 (×4): qty 300

## 2019-08-23 MED ORDER — MIDAZOLAM HCL 2 MG/2ML IJ SOLN
1.0000 mg | INTRAMUSCULAR | Status: DC | PRN
  Administered 2019-08-24 – 2019-08-26 (×3): 2 mg via INTRAVENOUS
  Filled 2019-08-23 (×4): qty 2

## 2019-08-23 MED ORDER — SODIUM CHLORIDE 0.9% IV SOLUTION
Freq: Once | INTRAVENOUS | Status: AC
  Administered 2019-08-23: 16:00:00 via INTRAVENOUS

## 2019-08-23 MED ORDER — SODIUM CHLORIDE 0.9 % IV SOLN
500.0000 mg | INTRAVENOUS | Status: DC
  Filled 2019-08-23: qty 10

## 2019-08-23 NOTE — Progress Notes (Signed)
[facility]  Subjective  -concern for upper GI bleed yesterday which turned out to be bleeding from the nasopharynx required intubation for airway protection.  Ultimately underwent EGD and had some varices banded.  Ultimately had a Foley catheter placed and is nasopharynx for tamponade of the hemorrhage.  Suspect he has cirrhosis now given varices on exam with history of alcohol abuse reported by his wife as well as since previous ultrasound evidence of nodular liver.  Objective (!) 89/68 (!) 111 98.7 F (37.1 C) (Oral) (!) 22 100%  Intake/Output Summary (Last 24 hours) at 08/23/2019 0902 Last data filed at 08/23/2019 0800 Gross per 24 hour  Intake 638.25 ml  Output 125 ml  Net 513.25 ml   Intubated, not following commands Foley in right nare for tamponade of bleeding Left PT brisk signal Left groin incision c/d/i Left leg incisions c/d/i - some serous drainage Volume overload improving Abdomen soft  Laboratory Lab Results: Recent Labs    08/23/19 0106 08/23/19 0539  WBC 33.9* 34.3*  HGB 7.1* 7.0*  HCT 22.6* 23.2*  PLT 85* 84*   BMET Recent Labs    08/22/19 1524 08/22/19 1644 08/23/19 0539  NA 138 141 141  K 4.6 4.8 5.6*  CL 105  --  108  CO2 23  --  20*  GLUCOSE 251*  --  173*  BUN 96*  --  111*  CREATININE 3.65*  --  5.02*  CALCIUM 8.1*  --  8.2*    COAG Lab Results  Component Value Date   INR 1.4 (H) 08/22/2019   INR 1.5 (H) 08/22/2019   INR 1.2 08/07/2019   No results found for: PTT  Assessment/Planning:  Postop day 12 status post left common femoral to PT bypass with composite PTFE and GSV. Continues to have a very brisk posterior tibial signal in the left foot.    Neuro: Now intubated for airway protection after bleeding from his nasopharynx yesterday.  Not following commands but moving all extremities. Significant mental issues postop.  Initially raised the question of withdrawal.  He is on CIWA protocol  now.  Also probably has a component of hepatic encephalopathy given evidence of cirrhosis.  We had him on lactulose but no enteral access today given nasopharyngeal bleed yesterday and access removed. [id number] post-op.  Hgb 7.0 today, would favor transfusion for HR and renal issues - will discuss with CCM.  More tachycardia, BP ok. Pulm: Intubated.  Minimal vent settings.  Gas is 7.37. GI: N.p.o.  No enteral access now that NG was removed after nasal pharynx bleed yesterday.  Right naris packed with Foley catheter for balloon tamponade.  Appreciate ENT and GI seeing him.  We have stopped his aspirin Plavix. Renal: AKI post-op progressed to oliguric renal failure.  Tried aggressive diuresis with high dose lasix and no improvement with worsening respiratory status.  CRRT and pulled off 10 L.  Nephrology to see today and anticipate he'll require dialysis again.  Potassium 5.6 today and slightly elevated.  BUN rising to 111.  Urine output decreased to only 200 yesterday. Has chronic thrombocytopenia that was worked up by hematology prior to surgery and previously had trial of steroids with no improvement. Platelets are 84.  I now suspect he has a component of cirrhosis given varices on EGD yesterday with chronically low platelets elevated liver enzymes and nodular findings on previous liver ultrasound. ID: WBC up to 34 today.  Broadened antibiotics yesterday.  Now on Vanc and cefepime.  Cultures not growing anything. FEN: Remain saline locked.  Appreciate nephrology input.  Remains critically ill in the ICU.  Updated his wife by phone yesterday.  His bypass was fairly uneventful but he has done very poorly postop.  Initially had renal failure with volume overload requiring CRRT.  Now he has had nasopharyngeal bleed requiring intubation for airway protection.  Chronically low platelets and elevated liver enzymes raises the question of cirrhosis and certainly could be a factor in his renal failure after  surgery.   [name] 08/23/2019 9:02 AM --

## 2019-08-23 NOTE — Progress Notes (Signed)
Patient ID: Edward Patricia., male   DOB: 1966-05-03, 53 y.o.   MRN: [id number] No bleeding since yesterday.  Packing in place.  I will deflate the balloon partially tomorrow and see how he does.

## 2019-08-23 NOTE — Progress Notes (Signed)
eLink Physician-Brief Progress Note Patient Name: Edward Rocha. DOB: Jan 04, 1966 MRN: [id number]   Date of Service  08/23/2019  HPI/Events of Note  Ventilator asynchrony - Patient has Fentanyl and Versed IV PRN already ordered. BP soft = 92/58.  eICU Interventions  Will order: 1. Check CVP via HD catheter pig tail.  2. Fluid vs Phenylephrine depending on CVP result.      Intervention Category Major Interventions: Other:  Lysle Dingwall 08/23/2019, 8:29 PM

## 2019-08-23 NOTE — Progress Notes (Addendum)
Pt goal is not met d/t pt had hypotension and HR 130 during HD  treatment.574m NS blouse given per Dr. FMaryjane Hurterordered. Tx terminated early 1.5 hrs.

## 2019-08-23 NOTE — Progress Notes (Signed)
[facility]    NEPHROLOGY PROGRESS NOTE  SUBJECTIVE: Patient sedated, on the ventilator for airway protection.  Unable to provide review of systems.  Events of yesterday noted.  OBJECTIVE:  Vitals:   08/23/19 1200 08/23/19 1300  BP: 111/63 (!) 117/58  Pulse: (!) 105 (!) 101  Resp: 17 18  Temp:    SpO2: 100% 100%    Intake/Output Summary (Last 24 hours) at 08/23/2019 1435 Last data filed at 08/23/2019 1300 Gross per 24 hour  Intake 1154.55 ml  Output 155 ml  Net 999.55 ml      General: Sedated NAD HEENT: MMM [place] AT anicteric sclera, orally intubated Neck:  No JVD, no adenopathy, right IJ temp cath placed 10/18 CV:  Heart RRR  Lungs:  L/S CTA bilaterally Abd:  abd SNT/ND with normal BS GU:  Bladder non-palpable Extremities: +1 bilateral lower extremity edema Skin:  No skin rash  MEDICATIONS:  . sodium chloride   Intravenous Once  . sodium chloride   Intravenous Once  . chlorhexidine gluconate (MEDLINE KIT)  15 mL Mouth Rinse BID  . Chlorhexidine Gluconate Cloth  6 each Topical Daily  . darbepoetin (ARANESP) injection - NON-DIALYSIS  150 mcg Subcutaneous Q Mon-1800  . folic acid  1 mg Intravenous Daily  . insulin aspart  0-9 Units Subcutaneous Q4H  . insulin glargine  5 Units Subcutaneous QPM  . lactulose  30 g Per Tube TID  . lactulose  300 mL Rectal BID  . mouth rinse  15 mL Mouth Rinse 10 times per day  . multivitamin  15 mL Per Tube Daily  . pantoprazole (PROTONIX) IV  40 mg Intravenous Q24H  . sodium chloride flush  3 mL Intravenous Q12H  . thiamine  100 mg Oral Daily   Or  . thiamine  100 mg Intravenous Daily       LABS:   CBC Latest Ref Rng & Units 08/23/2019 08/23/2019 08/23/2019  WBC 4.0 - 10.5 K/uL 36.2(H) 34.3(H) 33.9(H)  Hemoglobin 13.0 - 17.0 g/dL 6.9(LL) 7.0(L) 7.1(L)  Hematocrit 39.0 - 52.0 % 22.5(L) 23.2(L) 22.6(L)  Platelets 150 - 400 K/uL 86(L) 84(L) 85(L)    CMP Latest Ref Rng & Units 08/23/2019 08/22/2019 08/22/2019   Glucose 70 - 99 mg/dL 173(H) - 251(H)  BUN 6 - 20 mg/dL 111(H) - 96(H)  Creatinine 0.61 - 1.24 mg/dL 5.02(H) - 3.65(H)  Sodium 135 - 145 mmol/L 141 141 138  Potassium 3.5 - 5.1 mmol/L 5.6(H) 4.8 4.6  Chloride 98 - 111 mmol/L 108 - 105  CO2 22 - 32 mmol/L 20(L) - 23  Calcium 8.9 - 10.3 mg/dL 8.2(L) - 8.1(L)  Total Protein 6.5 - 8.1 g/dL 6.6 - 7.1  Total Bilirubin 0.3 - 1.2 mg/dL 2.5(H) - 2.0(H)  Alkaline Phos 38 - 126 U/L 86 - 88  AST 15 - 41 U/L 86(H) - 63(H)  ALT 0 - 44 U/L 30 - 27    Lab Results  Component Value Date   CALCIUM 8.2 (L) 08/23/2019   CAION 1.14 (L) 08/22/2019   PHOS 6.9 (H) 08/23/2019       Component Value Date/Time   COLORURINE AMBER (A) 08/13/2019 1309   APPEARANCEUR CLOUDY (A) 08/13/2019 1309   LABSPEC 1.014 08/13/2019 1309   PHURINE 5.0 08/13/2019 1309   GLUCOSEU >=500 (A) 08/13/2019 1309   HGBUR LARGE (A) 08/13/2019 1309   BILIRUBINUR NEGATIVE 08/13/2019 1309   KETONESUR NEGATIVE 08/13/2019 1309   PROTEINUR 100 (A) 08/13/2019 1309   NITRITE  NEGATIVE 08/13/2019 1309   LEUKOCYTESUR NEGATIVE 08/13/2019 1309      Component Value Date/Time   PHART 7.378 08/23/2019 0431   PCO2ART 37.8 08/23/2019 0431   PO2ART 127 (H) 08/23/2019 0431   HCO3 21.7 08/23/2019 0431   TCO2 25 08/22/2019 1644   ACIDBASEDEF 2.6 (H) 08/23/2019 0431   O2SAT 98.5 08/23/2019 0431       Component Value Date/Time   IRON 170 05/29/2019 1446   TIBC 332 05/29/2019 1446   FERRITIN 570 (H) 05/29/2019 1446   IRONPCTSAT 51 (H) 05/29/2019 1446       ASSESSMENT/PLAN:    1.AKI (baseline crt ~1) - multifactorial in the setting of ischemic ATN with ABLA and relative hypotension, contrast nephropathy and BOO. Ultimately required initiation of CRRT on 10/18due to profound volume overloadafter failing high doses of Lasix and metolazone.Labs and volume status greatly improved as a result of CRRT which was discontinued 10/21 at 15:30.  Urine output marginal the past 24 hours.  Creatinine  and potassium both increasing.  We will plan intermittent hemodialysis treatment today.  Orders placed this morning.   2. Acute hypoxic respiratory failure - volume overload vs multifocal pneumonia.  Reintubated yesterday for airway protection in the setting of concern for GI bleed.   3. ABLA - received total of 3 units since admission. Hgb 8.2 this morning. Continue ESAand transfuse as needed. 4. Hyponatremia due to hypervolemia -resolved 5. HTN -BP elevated.  Labetalol added as needed.  Agree.  6. Thrombocytopenia - chronic and worked up by hematology without etiology. HIT panel negative. On heparin. 7. DM type 2 - poorly controlled. Holding Invokana due to AKI. SSI as needed  8. Elytes - K slightly elevated, for dialysis today; corrected calcium ok;  Phos improved. 9. Metabolic acidosis - due to acute renal failure;resolved with CRRT,    [employer], DO, FACP

## 2019-08-23 NOTE — Progress Notes (Signed)
   Recent Labs  Lab 08/22/19 1644 08/22/19 1919 08/23/19 0106 08/23/19 0539 08/23/19 1325  HGB 9.9* 7.1* 7.1* 7.0* 6.9*    Called wife - she is jevohah witness - after dicussion of pro, con  Of PRBC she reluctantly agreed to allow Edward Rocha. to get 1 unit prbc based on substiuted judgement  She wants Iron and B12 and blood draw conservation Will dc labs for 08/24/19 - check blood 3-4 times a week or every other day only     SIGNATURE    Dr. Brand Males, M.D., F.C.C.P,  Pulmonary and Critical Care Medicine Staff Physician, [place] Director - Interstitial Lung Disease  Program  Pulmonary [place] at [place], [place], [address]  Pager: [phone], If no answer or between  15:00h - 7:00h: call [phone] Telephone: [phone]  2:47 PM 08/23/2019

## 2019-08-23 NOTE — Progress Notes (Signed)
Not long after initiation of intermittent dialysis treatment patient's ventilator noted to begin alarming. Entering the patients room for further assessment revealed telemetry monitor alarming for elevated HR. HR increased up to 137 as sinus tach. Patients eyes wide open with a blank gaze up towards ceiling light. BP assessed at this time and noted to have SBP 62/49. Blood flow rate of blood transfusion increased to 500 mL/hr and dialysis nurse gave 200 mL NS bolus via dialysis circuit. On call Nephrologist notified and updated. Advised to try to keep patient even and continue HD treatment for filtration/cleansing purposes due to elevated potassium level. May give another bolus of [id number] mL NS if systolic drops less than 90. If patient continues to fluctuate less than 90 systolic after [id number] mL NS bolus, rinse back blood and end dialysis treatment.   Patient did require the 500 mL NS bolus and continued to present with hemodynamic instability with elevated HR in the 130's and SBP fluctuating 70's-80's. Dialysis nurse rinsed back patients blood and ended the HD treatment. On call Nephrologist to be notified and updated.

## 2019-08-23 NOTE — Progress Notes (Addendum)
NAME:  Edward Rufus., MRN:  [id number], DOB:  [birth year], LOS: 12 ADMISSION DATE:  08/11/2019, CONSULTATION DATE:  08/17/19, CHIEF COMPLAINT:  SOB  Brief History    53 yo with history of DM, HTN, HLD, thrombocytopenia, and PAD s/p aortogram with left iliac arteriogram prior to left femoral to PTA bypass graft placement on 08/11/19.  Developed oliguric renal failure, CCM consulted 10/18 due to worsening hypoxia (  volume overload vs pneumonia) . Pt was treated with CVVHD x 2 days 10/19-10/21. PCCM had signed off on 10/21. 10/23 PCCM were re consulted for emergent intubation in setting of vomiting BRB emesis of 600 cc's decreased mental status and concern for airway protection. Pt. Was emergently intubated by Dr. Maryclare Bean . PCCM will follow for vent management   Past Medical History  DM HTN HLD Thrombocytopenia PAD AKI  Significant Hospital Events   PTA on 08/11/19   10/23 - Re-consulted 10/23 for emergent intubation in setting of bloody emesis and inability to protect airway.  Full support ABG pending 600 cc of BRB emesis with clots GI consulted by Vascular surgery Decreased mental status  Consults:  PCCM Nephro Surgery  Procedures:  RIJ HD cath 10/18 >> 10/23 - ETT  Significant Diagnostic Tests:  10/14  renal ultrasound >> no hnosis  Micro Data:  Covid negative. 10/18 BC >>ng 10/23 Blood 10/23 Sputum Antimicrobials:  10/16  Cefepime >> 10/23 Maxipime>> 10/23 Vanc >>  Interim history/subjective:   10/24 - intubated yesterday in setting of massive GI v ENT hemorrhag v both. OGT places yresterday (known chronic low platelets idiopathich and unresponsive to steroids with allergic rxn to platelets 1 month ago). GI scopy yesterday - s/p banding of 2 non-bleeding varices. Bleeding not thought to be from esophagus or stomatch -> ENT cx -> Procedures: A 30 cc Foley catheter was inserted into the right side of the nose and passed all the way into the oropharynx.  Direct  visualization was used to confirm the position of the balloon.  The balloon was then inflated with 30 cc of saline.  It was then pulled back into the nasopharynx and secured with a clamp.  After this maneuver there was no further bleeding identified -> bilateral nasal packing  Oropharyngeal bleeding due to NG tubethought to be cause   On 100% fio2  RASS -4 on fent gtt  Wife - Jevohah witness - does not want PRBC unless we have to  Objective   Blood pressure (!) 89/68, pulse (!) 111, temperature 98.7 F (37.1 C), temperature source Oral, resp. rate (!) 22, height 6\' 1"  (1.854 m), weight 88.9 kg, SpO2 100 %.    Vent Mode: PRVC FiO2 (%):  [40 %-100 %] 40 % Set Rate:  [18 bmp] 18 bmp Vt Set:  [630 mL] 630 mL PEEP:  [5 cmH20] 5 cmH20 Plateau Pressure:  [22 cmH20-32 cmH20] 22 cmH20   Intake/Output Summary (Last 24 hours) at 08/23/2019 0825 Last data filed at 08/23/2019 0000 Gross per 24 hour  Intake 638.25 ml  Output 100 ml  Net 538.25 ml   Filed Weights   08/20/19 0500 08/21/19 0500 08/22/19 0300  Weight: 90.5 kg 88.2 kg 88.9 kg     General Appearance:  Looks criticall ill Head:  Normocephalic, without obvious abnormality, atraumatic Eyes:  PERRL - yes, conjunctiva/corneas - yes     Ears:  Normal external ear canals, both ears Nose:  Both packed. Rt nostril with balloon cath. Throat:  ETT TUBE - yes ,  OG tube - NO Neck:  Supple,  No enlargement/tenderness/nodules. R IJ HD CATH + Lungs: Clear to auscultation bilaterally, Ventilator   Synchrony - yes Heart:  S1 and S2 normal, no murmur, CVP - no.  Pressors - no Abdomen:  Soft, no masses, no organomegaly Genitalia / Rectal:  Not done Extremities:  Extremities- intact with LLLE with vascular bandagte Skin:  ntact in exposed areas . Sacral area - not examined Neurologic:  Sedation - fent gtt -> RASS - -5      Resolved Hospital Problem list   NA   Assessment & Plan:   Acute Emergent  intubation for airway protection   Due to bleed from NP/OP area - stopped with ballloon tamponade  08/23/2019 - > does not meet criteria for SBT/Extubation in setting of Acute Respiratory Failure due to RASS -5 thought bleeding from ENT area stopped    Plan - PRVC  - HOB > 30 degres  Massive hemorrhagge - initial 10/23  - unclear hematemsis versys hemoptysis - ended up as OP teear bleed. S/p non-bleeding UGI varices banding.  S/p bilateral nasal pacing and Rt nostril balloon tamponade that stopped the bleeding   08/23/2019 - no active bleed . Off octreotide  Plan  - monitoring by ENT - ? Still on octreotide    HTN -  X 2 days prior to bleeding Had been hypotensive over weekend while on CVVH Plan Lobetalol 5 mg prn for SBP > 150  AKI favor ischemic ATN due to hypotension + contrast insult : CVVHD 10/19-10/21  08/23/2019 -creat 5 and K 5.6  Plan  - likely needs CRRT: RN will recall renal - kayexlaate enema - MAP goal is > 65 mm Hg  Acute encephalopathy (Per wife pt  is a heavy drinker ( drinks two 40's per day)   - had gotten cofnused from 08/20/2019 onwards and Responded to lactulose  08/23/2019 - comatose on fent gtt  Plan Trend ammonia Dc fent gtt Do fent prn Do versed prn Lactulose enema in absence  of PO route   Thrombocytopenia appears to be chronic,- Platelets in the 70,000 range  08/23/2019 - uncanged  Plan Trend CBC Monitor for bleeding Transfuse as needed   Anemia of critical illnss and bleeding   08/23/2019 - hgb 7.0 - VVS interested in  1 unit PRBC. Wife reporting she (not patient) is Jehovah Witness and so reporting to give PRBC only if we have to    - PRBC for hgb </= 6.9gm%  - per CCM literature  - so will hold off at 7.0gm%  - exceptions are   -  if ACS susepcted/confirmed then transfuse for hgb </= 8.0gm%,  or    -  active bleeding with hemodynamic instability, then transfuse regardless of hemoglobin value   At at all times try to transfuse 1 unit prbc as possible  with exception of active hemorrhage   Concern for aspiration  08/23/2019 - s/p 3 days of cefepime. On vanc. No fever. CXR clear  Plan   -dc all abx   Cirrhosis  Of liver  - likely etoh -with varices  PLAn  - lactulose enena - GI following - thiamine and folate IV  - Inability to take PO  - unabl to to do tube feeds due to bilateral nasal packing. With concern for OP tear causing bleeidng - need ENT approval before putting OG tube   Best practice:  Diet: NPO for now Pain/Anxiety/Delirium protocol (if indicated): per primary VAP protocol (if  indicated):Initiated DVT prophylaxis: SCD (? Heparin for CRRT when restarted) GI prophylaxis: Protonix Mobility: As toelrated Code Status: Full for now Family Communication:  Wife gets updates from [place] Disposition: ICU    ATTESTATION & SIGNATURE   The patient Jeffry Riss. is critically ill with multiple organ systems failure and requires high complexity decision making for assessment and support, frequent evaluation and titration of therapies, application of advanced monitoring technologies and extensive interpretation of multiple databases.   Critical Care Time devoted to patient care services described in this note is  40  Minutes. This time reflects time of care of this signee Dr Brand Males. This critical care time does not reflect procedure time, or teaching time or supervisory time of PA/NP/Med student/Med Resident etc but could involve care discussion time     Dr. Brand Males, M.D., [facility].C.P Pulmonary and Critical Care Medicine Staff Physician [facility] Pager: [phone], If no answer or between  15:00h - 7:00h: call [phone]  08/23/2019 8:26 AM    LABS    PULMONARY Recent Labs  Lab 08/17/19 2058 08/22/19 1313 08/22/19 1644 08/23/19 0431  PHART 7.407 7.480* 7.345* 7.378  PCO2ART 32.2 30.1* 43.6 37.8  PO2ART 66.0* 81.0* 273.0* 127*  HCO3 20.2  22.4 23.7 21.7  TCO2 21* 23 25  --   O2SAT 93.0 97.0 100.0 98.5    CBC Recent Labs  Lab 08/22/19 1919 08/23/19 0106 08/23/19 0539  HGB 7.1* 7.1* 7.0*  HCT 22.5* 22.6* 23.2*  WBC 33.1* 33.9* 34.3*  PLT 75* 85* 84*    COAGULATION Recent Labs  Lab 08/22/19 1309  INR 1.5*   1.4*    CARDIAC  No results for input(s): TROPONINI in the last 168 hours. No results for input(s): PROBNP in the last 168 hours.   CHEMISTRY Recent Labs  Lab 08/20/19 0354  08/21/19 0504 08/21/19 1709 08/22/19 0431 08/22/19 0437 08/22/19 1309 08/22/19 1313 08/22/19 1524 08/22/19 1644 08/23/19 0539  NA 135   < > 135 137 138  --  139 142 138 141 141  K 3.7   < > 3.8 4.1 4.3  --  4.0 4.1 4.6 4.8 5.6*  CL 101   < > 103 104 105  --  106  --  105  --  108  CO2 24   < > 21* 22 22  --  22  --  23  --  20*  GLUCOSE 98   < > 192* 257* 267*  --  250*  --  251*  --  173*  BUN 36*   < > 59* 72* 81*  --  92*  --  96*  --  111*  CREATININE 2.01*   < > 3.06* 3.13* 3.32*  --  3.47*  --  3.65*  --  5.02*  CALCIUM 8.1*   < > 7.8* 8.1* 8.1*  --  8.3*  --  8.1*  --  8.2*  MG 2.8*  --  2.8*  --   --  3.2*  --   --  3.5*  --  3.6*  PHOS 2.1*   < > 2.2* 2.6 3.0  --  3.4  --  4.8*  --  6.9*   < > = values in this interval not displayed.   Estimated Creatinine Clearance: 18.8 mL/min (A) (by C-G formula based on SCr of 5.02 mg/dL (H)).   LIVER Recent Labs  Lab 08/22/19 0431 08/22/19  [id number] 08/22/19 1309 08/22/19 1524 08/23/19 0539  AST  --  69*  --  63* 86*  ALT  --  28  --  27 30  ALKPHOS  --  90  --  88 86  BILITOT  --  2.1*  --  2.0* 2.5*  PROT  --  7.1  --  7.1 6.6  ALBUMIN 1.8* 1.8* 1.8* 1.8* 1.7*  INR  --   --  1.5*   1.4*  --   --      INFECTIOUS Recent Labs  Lab 08/20/19 0354 08/22/19 1524 08/23/19 0539  LATICACIDVEN  --  2.0* 3.1*  PROCALCITON 1.00 0.55 0.60     ENDOCRINE CBG (last 3)  Recent Labs    08/22/19 2330 08/23/19 0405 08/23/19 0754  GLUCAP 182* 169* 153*          IMAGING x48h  - image(s) personally visualized  -   highlighted in bold Portable Chest X-[place]man  Result Date: 08/22/2019 CLINICAL DATA:  Hemoptysis.  Post intubation. EXAM: PORTABLE CHEST 1 VIEW COMPARISON:  Chest x-[place]ler from same day. FINDINGS: Interval placement of an endotracheal tube with the tip 3.1 cm above the carina. Unchanged feeding tube tip in the gastric fundus. Interval placement of a second enteric tube with the tube looped back on itself in the tip in the mid esophagus. Unchanged right internal jugular central venous catheter. The heart size and mediastinal contours are within normal limits. Diffuse central peribronchial thickening. Increasing density at the right lung base with new small right pleural effusion. No pneumothorax. No acute osseous abnormality. IMPRESSION: 1. Appropriately positioned endotracheal tube. 2. New enteric tube coiled in the esophagus. Recommend repositioning. 3. Diffuse central peribronchial thickening with increasing right basilar opacity that could reflect aspiration. 4. New small right pleural effusion. Electronically Signed   By: Titus Dubin M.D.   On: 08/22/2019 14:39   Dg Chest Port 1 View  Result Date: 08/22/2019 CLINICAL DATA:  Status post feeding tube placement today. EXAM: PORTABLE CHEST 1 VIEW COMPARISON:  Single-view of the chest 08/19/2019. FINDINGS: New feeding tube is in place with its tip in the fundus of the stomach. Right IJ catheter tip projects at the superior cavoatrial junction. Lungs are clear. No pneumothorax or pleural effusion. Aortic atherosclerosis noted. Remote upper right rib fractures are noted IMPRESSION: Feeding tube tip is in the fundus of the stomach. Right IJ catheter tip projects at the superior cavoatrial junction. Lungs clear.  No pneumothorax. Electronically Signed   By: Inge Rise M.D.   On: 08/22/2019 13:12   Dg Abd Portable 1v  Result Date: 08/22/2019 CLINICAL DATA:  New enteric tube placement.  EXAM: PORTABLE ABDOMEN - 1 VIEW COMPARISON:  Abdominal x-[place] from same day. FINDINGS: Interval exchange of the enteric tube for a new feeding tube in the stomach. The bowel gas pattern is normal. No radio-opaque calculi or other significant radiographic abnormality are seen. Prior iliac stents. No acute osseous abnormality. Unchanged small calcification in the right upper quadrant corresponding to a gallstone based on prior CT. IMPRESSION: Feeding tube in the stomach. Electronically Signed   By: Titus Dubin M.D.   On: 08/22/2019 14:41   Dg Abd Portable 1v  Result Date: 08/22/2019 CLINICAL DATA:  Orogastric tube placement. EXAM: PORTABLE ABDOMEN - 1 VIEW COMPARISON:  Radiograph 08/20/2019 FINDINGS: Tip and side port of the enteric tube below the diaphragm in the stomach. Nonobstructive bowel gas pattern. Moderate stool burden in the transverse colon. Central line catheter tip in  the SVC. IMPRESSION: Tip and side port of the enteric tube below the diaphragm in the stomach. Electronically Signed   By: Keith Rake M.D.   On: 08/22/2019 02:05     Anti-infectives (From admission, onward)   Start     Dose/Rate Route Frequency Ordered Stop   08/22/19 1200  vancomycin (VANCOCIN) 1,250 mg in sodium chloride 0.9 % 250 mL IVPB     1,250 mg 166.7 mL/hr over 90 Minutes Intravenous Every 24 hours 08/22/19 1149     08/20/19 2200  ceFEPIme (MAXIPIME) 2 g in sodium chloride 0.9 % 100 mL IVPB     2 g 200 mL/hr over 30 Minutes Intravenous Every 24 hours 08/20/19 1322 08/22/19 2310   08/18/19 1157  ceFEPIme (MAXIPIME) 2 g in sodium chloride 0.9 % 100 mL IVPB  Status:  Discontinued     2 g 200 mL/hr over 30 Minutes Intravenous Every 24 hours 08/17/19 1719 08/17/19 2220   08/18/19 1000  ceFEPIme (MAXIPIME) 2 g in sodium chloride 0.9 % 100 mL IVPB  Status:  Discontinued     2 g 200 mL/hr over 30 Minutes Intravenous Every 12 hours 08/17/19 2220 08/20/19 1322   08/16/19 1115  ceFEPIme (MAXIPIME) 2 g in sodium  chloride 0.9 % 100 mL IVPB  Status:  Discontinued     2 g 200 mL/hr over 30 Minutes Intravenous Every 12 hours 08/16/19 1114 08/17/19 1719   08/11/19 2000  ceFAZolin (ANCEF) IVPB 2g/100 mL premix     2 g 200 mL/hr over 30 Minutes Intravenous Every 8 hours 08/11/19 1750 08/12/19 0335   08/11/19 0550  ceFAZolin (ANCEF) IVPB 2g/100 mL premix     2 g 200 mL/hr over 30 Minutes Intravenous 30 min pre-op 08/11/19 0550 08/11/19 1136

## 2019-08-23 NOTE — Progress Notes (Signed)
MEDICATION RELATED CONSULT NOTE - INITIAL   Pharmacy Consult for IV iron Indication: anemia  Allergies  Allergen Reactions  . Other     Pt received platelets and had a bad reaction from infusion     Patient Measurements: Height: 6\' 1"  (185.4 cm) Weight: 195 lb 15.8 oz (88.9 kg) IBW/kg (Calculated) : 79.9  Vital Signs: Temp: 98.7 F (37.1 C) (10/24 0800) Temp Source: Oral (10/24 0800) BP: 104/61 (10/24 1400) Pulse Rate: 103 (10/24 1400) Intake/Output from previous day: 10/23 0701 - 10/24 0700 In: 961 [I.V.:610.9; IV Piggyback:350.1] Out: 225 [Urine:225] Intake/Output from this shift: Total I/O In: 193.5 [I.V.:193.5] Out: 55 [Urine:55]  Labs: Recent Labs    08/22/19 0437  08/22/19 1309  08/22/19 1524  08/23/19 0106 08/23/19 0539 08/23/19 1325  WBC 25.4*   < >  --   --  28.7*   < > 33.9* 34.3* 36.2*  HGB 8.1*   < >  --    < > 7.6*   < > 7.1* 7.0* 6.9*  HCT 25.6*   < >  --    < > 24.0*   < > 22.6* 23.2* 22.5*  PLT 75*   < > 80*  --  79*   < > 85* 84* 86*  APTT 49*  --  44*  44*  --   --   --   --  34  --   CREATININE  --   --  3.47*  --  3.65*  --   --  5.02*  --   MG 3.2*  --   --   --  3.5*  --   --  3.6*  --   PHOS  --   --  3.4  --  4.8*  --   --  6.9*  --   ALBUMIN 1.8*  --  1.8*  --  1.8*  --   --  1.7*  --   PROT 7.1  --   --   --  7.1  --   --  6.6  --   AST 69*  --   --   --  63*  --   --  86*  --   ALT 28  --   --   --  27  --   --  30  --   ALKPHOS 90  --   --   --  88  --   --  86  --   BILITOT 2.1*  --   --   --  2.0*  --   --  2.5*  --   BILIDIR 1.0*  --   --   --   --   --   --   --   --   IBILI 1.1*  --   --   --   --   --   --   --   --    < > = values in this interval not displayed.   Estimated Creatinine Clearance: 18.8 mL/min (A) (by C-G formula based on SCr of 5.02 mg/dL (H)).   Microbiology: Recent Results (from the past 720 hour(s))  SARS CORONAVIRUS 2 (TAT 6-24 HRS) Nasopharyngeal Nasopharyngeal Swab     Status: None   Collection  Time: 08/07/19  6:58 AM   Specimen: Nasopharyngeal Swab  Result Value Ref Range Status   SARS Coronavirus 2 NEGATIVE NEGATIVE Final    Comment: (NOTE) SARS-CoV-2 target nucleic acids are NOT DETECTED. The SARS-CoV-2 RNA  is generally detectable in upper and lower respiratory specimens during the acute phase of infection. Negative results do not preclude SARS-CoV-2 infection, do not rule out co-infections with other pathogens, and should not be used as the sole basis for treatment or other patient management decisions. Negative results must be combined with clinical observations, patient history, and epidemiological information. The expected result is Negative. Fact Sheet for Patients: SugarRoll.be Fact Sheet for Healthcare Providers: https://www.woods-mathews.com/ This test is not yet approved or cleared by the [place] FDA and  has been authorized for detection and/or diagnosis of SARS-CoV-2 by FDA under an Emergency Use Authorization (EUA). This EUA will remain  in effect (meaning this test can be used) for the duration of the COVID-19 declaration under Section 56 4(b)(1) of the Act, 21 U.S.C. section 360bbb-3(b)(1), unless the authorization is terminated or revoked sooner. Performed at [facility], [place] [address], [place], [place] [address]   Surgical pcr screen     Status: Abnormal   Collection Time: 08/07/19  8:46 AM   Specimen: Nasal Mucosa; Nasal Swab  Result Value Ref Range Status   MRSA, PCR NEGATIVE NEGATIVE Final   Staphylococcus aureus POSITIVE (A) NEGATIVE Final    Comment: (NOTE) The Xpert SA Assay (FDA approved for NASAL specimens in patients 78 years of age and older), is one component of a comprehensive surveillance program. It is not intended to diagnose infection nor to guide or monitor treatment. Performed at [facility], [place] [address] [place], [place] [address]   Culture, blood (routine x 2)      Status: None   Collection Time: 08/17/19 10:23 PM   Specimen: BLOOD RIGHT HAND  Result Value Ref Range Status   Specimen Description BLOOD RIGHT HAND  Final   Special Requests   Final    BOTTLES DRAWN AEROBIC AND ANAEROBIC Blood Culture results may not be optimal due to an inadequate volume of blood received in culture bottles   Culture   Final    NO GROWTH 5 DAYS Performed at [facility], [place] [address] [place], [place] [address]    Report Status 08/22/2019 FINAL  Final  Culture, blood (routine x 2)     Status: None   Collection Time: 08/17/19 10:26 PM   Specimen: BLOOD LEFT HAND  Result Value Ref Range Status   Specimen Description BLOOD LEFT HAND  Final   Special Requests   Final    BOTTLES DRAWN AEROBIC AND ANAEROBIC Blood Culture results may not be optimal due to an inadequate volume of blood received in culture bottles   Culture   Final    NO GROWTH 5 DAYS Performed at [facility], [place] [address], [place], [place] [address]    Report Status 08/22/2019 FINAL  Final  Culture, blood (Routine X 2) w Reflex to ID Panel     Status: None (Preliminary result)   Collection Time: 08/22/19  9:53 AM   Specimen: BLOOD LEFT HAND  Result Value Ref Range Status   Specimen Description BLOOD LEFT HAND  Final   Special Requests   Final    BOTTLES DRAWN AEROBIC ONLY Blood Culture adequate volume   Culture   Final    NO GROWTH < 24 HOURS Performed at [facility] [place] [facility], [place] [address] [place], [place] [address]    Report Status PENDING  Incomplete  Culture, blood (Routine X 2) w Reflex to ID Panel     Status: None (Preliminary result)  Collection Time: 08/22/19  9:59 AM   Specimen: BLOOD RIGHT HAND  Result Value Ref Range Status   Specimen Description BLOOD RIGHT HAND  Final   Special Requests   Final    BOTTLES DRAWN AEROBIC ONLY Blood Culture adequate volume   Culture   Final    NO GROWTH < 24 HOURS Performed at [facility], [address] [place],  [place] [address]    Report Status PENDING  Incomplete  Culture, respiratory (non-expectorated)     Status: None (Preliminary result)   Collection Time: 08/22/19  6:13 PM   Specimen: Tracheal Aspirate; Respiratory  Result Value Ref Range Status   Specimen Description TRACHEAL ASPIRATE  Final   Special Requests Normal  Final   Gram Stain NO WBC SEEN NO ORGANISMS SEEN   Final   Culture   Final    TOO YOUNG TO READ Performed at [facility], [address], [place], [place] [address]    Report Status PENDING  Incomplete    Medical History: Past Medical History:  Diagnosis Date  . Acute renal failure (ARF) ([place])    12/2018 when admitted for hyperosmolar hyperglycemic nonketotic syndrome  . Bilateral pain of leg and foot   . Diabetes mellitus without complication ([place])    Type II  . Dizziness   . Elevated LFTs 05/2019  . Frequent urination   . Hyperglycemia    Non-Ketotic Hyperosmolar.  . Hyperlipidemia   . Hypernatremia   . Hypertension   . Peripheral vascular disease ([place])   . Syncope    12/2018, diagnosed with hyperosmolar hyperglycemic nonketotic syndrome, AKI, hyponatremia ([facility])  . Thrombocytopenia ([place])   . Thyroid disease    Hypothyroidism  . Weakness    Assessment: 53 year old male with anemia of chronic disease. Pharmacy consult to replace iron.   Current hgb 6.9 Normal iron stores on 04/2019 of 170  Goal of Therapy:  Hgb 12-14  Plan:  Will give 25mg  test dose of infed, if tolerated continue with 500mg  q24 hours x 2 of infed.   Erin Hearing PharmD., BCPS Clinical Pharmacist 08/23/2019 2:56 PM

## 2019-08-24 ENCOUNTER — Inpatient Hospital Stay (HOSPITAL_COMMUNITY): Payer: Managed Care, Other (non HMO)

## 2019-08-24 ENCOUNTER — Encounter (HOSPITAL_COMMUNITY): Payer: Self-pay | Admitting: Gastroenterology

## 2019-08-24 DIAGNOSIS — J9600 Acute respiratory failure, unspecified whether with hypoxia or hypercapnia: Secondary | ICD-10-CM | POA: Diagnosis not present

## 2019-08-24 DIAGNOSIS — G9340 Encephalopathy, unspecified: Secondary | ICD-10-CM | POA: Diagnosis not present

## 2019-08-24 LAB — CBC WITH DIFFERENTIAL/PLATELET
Abs Immature Granulocytes: 1.22 10*3/uL — ABNORMAL HIGH (ref 0.00–0.07)
Basophils Absolute: 0.1 10*3/uL (ref 0.0–0.1)
Basophils Relative: 0 %
Eosinophils Absolute: 0.2 10*3/uL (ref 0.0–0.5)
Eosinophils Relative: 1 %
HCT: 24.3 % — ABNORMAL LOW (ref 39.0–52.0)
Hemoglobin: 7.7 g/dL — ABNORMAL LOW (ref 13.0–17.0)
Immature Granulocytes: 3 %
Lymphocytes Relative: 11 %
Lymphs Abs: 4.1 10*3/uL — ABNORMAL HIGH (ref 0.7–4.0)
MCH: 28.4 pg (ref 26.0–34.0)
MCHC: 31.7 g/dL (ref 30.0–36.0)
MCV: 89.7 fL (ref 80.0–100.0)
Monocytes Absolute: 2.8 10*3/uL — ABNORMAL HIGH (ref 0.1–1.0)
Monocytes Relative: 8 %
Neutro Abs: 28.3 10*3/uL — ABNORMAL HIGH (ref 1.7–7.7)
Neutrophils Relative %: 77 %
Platelets: 61 10*3/uL — ABNORMAL LOW (ref 150–400)
RBC: 2.71 MIL/uL — ABNORMAL LOW (ref 4.22–5.81)
RDW: 17 % — ABNORMAL HIGH (ref 11.5–15.5)
WBC: 36.7 10*3/uL — ABNORMAL HIGH (ref 4.0–10.5)
nRBC: 0.1 % (ref 0.0–0.2)

## 2019-08-24 LAB — COMPREHENSIVE METABOLIC PANEL
ALT: 111 U/L — ABNORMAL HIGH (ref 0–44)
AST: 454 U/L — ABNORMAL HIGH (ref 15–41)
Albumin: 1.6 g/dL — ABNORMAL LOW (ref 3.5–5.0)
Alkaline Phosphatase: 96 U/L (ref 38–126)
Anion gap: 12 (ref 5–15)
BUN: 117 mg/dL — ABNORMAL HIGH (ref 6–20)
CO2: 20 mmol/L — ABNORMAL LOW (ref 22–32)
Calcium: 8.1 mg/dL — ABNORMAL LOW (ref 8.9–10.3)
Chloride: 104 mmol/L (ref 98–111)
Creatinine, Ser: 6.06 mg/dL — ABNORMAL HIGH (ref 0.61–1.24)
GFR calc Af Amer: 11 mL/min — ABNORMAL LOW (ref >60)
GFR calc non Af Amer: 10 mL/min — ABNORMAL LOW (ref >60)
Glucose, Bld: 230 mg/dL — ABNORMAL HIGH (ref 70–99)
Potassium: 5.5 mmol/L — ABNORMAL HIGH (ref 3.5–5.1)
Sodium: 136 mmol/L (ref 135–145)
Total Bilirubin: 4.1 mg/dL — ABNORMAL HIGH (ref 0.3–1.2)
Total Protein: 6.2 g/dL — ABNORMAL LOW (ref 6.5–8.1)

## 2019-08-24 LAB — RENAL FUNCTION PANEL
Albumin: 1.7 g/dL — ABNORMAL LOW (ref 3.5–5.0)
Anion gap: 12 (ref 5–15)
BUN: 112 mg/dL — ABNORMAL HIGH (ref 6–20)
CO2: 22 mmol/L (ref 22–32)
Calcium: 8 mg/dL — ABNORMAL LOW (ref 8.9–10.3)
Chloride: 106 mmol/L (ref 98–111)
Creatinine, Ser: 5.7 mg/dL — ABNORMAL HIGH (ref 0.61–1.24)
GFR calc Af Amer: 12 mL/min — ABNORMAL LOW (ref >60)
GFR calc non Af Amer: 10 mL/min — ABNORMAL LOW (ref >60)
Glucose, Bld: 202 mg/dL — ABNORMAL HIGH (ref 70–99)
Phosphorus: 7.4 mg/dL — ABNORMAL HIGH (ref 2.5–4.6)
Potassium: 4.6 mmol/L (ref 3.5–5.1)
Sodium: 140 mmol/L (ref 135–145)

## 2019-08-24 LAB — GLUCOSE, CAPILLARY
Glucose-Capillary: 149 mg/dL — ABNORMAL HIGH (ref 70–99)
Glucose-Capillary: 163 mg/dL — ABNORMAL HIGH (ref 70–99)
Glucose-Capillary: 172 mg/dL — ABNORMAL HIGH (ref 70–99)
Glucose-Capillary: 191 mg/dL — ABNORMAL HIGH (ref 70–99)
Glucose-Capillary: 206 mg/dL — ABNORMAL HIGH (ref 70–99)
Glucose-Capillary: 209 mg/dL — ABNORMAL HIGH (ref 70–99)

## 2019-08-24 LAB — BPAM RBC
Blood Product Expiration Date: 202011252359
ISSUE DATE / TIME: 202010241626
Unit Type and Rh: 5100

## 2019-08-24 LAB — TYPE AND SCREEN
ABO/RH(D): O POS
Antibody Screen: NEGATIVE
Unit division: 0

## 2019-08-24 LAB — HEPATITIS B SURFACE ANTIGEN: Hepatitis B Surface Ag: NONREACTIVE

## 2019-08-24 LAB — HEPATITIS B CORE ANTIBODY, TOTAL: Hep B Core Total Ab: NONREACTIVE

## 2019-08-24 MED ORDER — DEXMEDETOMIDINE HCL IN NACL 400 MCG/100ML IV SOLN
0.4000 ug/kg/h | INTRAVENOUS | Status: DC
  Administered 2019-08-24: 0.2 ug/kg/h via INTRAVENOUS
  Administered 2019-08-25 – 2019-08-27 (×4): 0.4 ug/kg/h via INTRAVENOUS
  Administered 2019-08-27: 0.5 ug/kg/h via INTRAVENOUS
  Administered 2019-08-27: 0.6 ug/kg/h via INTRAVENOUS
  Administered 2019-08-28: 1 ug/kg/h via INTRAVENOUS
  Administered 2019-08-28: 0.4 ug/kg/h via INTRAVENOUS
  Administered 2019-08-28: 0.3 ug/kg/h via INTRAVENOUS
  Filled 2019-08-24 (×9): qty 100

## 2019-08-24 MED ORDER — SODIUM CHLORIDE 0.9% FLUSH
10.0000 mL | Freq: Two times a day (BID) | INTRAVENOUS | Status: DC
  Administered 2019-08-24 – 2019-09-08 (×20): 10 mL
  Administered 2019-09-08: 20 mL

## 2019-08-24 MED ORDER — SODIUM BICARBONATE 8.4 % IV SOLN
INTRAVENOUS | Status: DC
  Administered 2019-08-24 – 2019-08-25 (×2): via INTRAVENOUS
  Filled 2019-08-24: qty 50
  Filled 2019-08-24 (×2): qty 100

## 2019-08-24 MED ORDER — SODIUM POLYSTYRENE SULFONATE 15 GM/60ML PO SUSP
30.0000 g | Freq: Once | ORAL | Status: DC
  Filled 2019-08-24: qty 120

## 2019-08-24 MED ORDER — SODIUM CHLORIDE 0.9 % FOR CRRT: INTRAVENOUS_CENTRAL | Status: DC | PRN

## 2019-08-24 MED ORDER — PRISMASOL BGK 0/2.5 32-2.5 MEQ/L IV SOLN
INTRAVENOUS | Status: DC
  Administered 2019-08-24: 14:00:00 via INTRAVENOUS_CENTRAL
  Filled 2019-08-24 (×3): qty 5000

## 2019-08-24 MED ORDER — PRISMASOL BGK 4/2.5 32-4-2.5 MEQ/L REPLACEMENT SOLN
Status: DC
  Administered 2019-08-24: 14:00:00 via INTRAVENOUS_CENTRAL

## 2019-08-24 MED ORDER — SODIUM CHLORIDE 0.9 % IV SOLN
500.0000 mg | INTRAVENOUS | Status: AC
  Administered 2019-08-24 – 2019-08-25 (×2): 500 mg via INTRAVENOUS
  Filled 2019-08-24 (×2): qty 10

## 2019-08-24 MED ORDER — SODIUM CHLORIDE 0.9% FLUSH: 10.0000 mL | INTRAVENOUS | Status: DC | PRN

## 2019-08-24 MED ORDER — VITAMIN B-12 1000 MCG PO TABS: 1000.0000 ug | ORAL_TABLET | Freq: Every day | ORAL | Status: DC

## 2019-08-24 MED ORDER — CYANOCOBALAMIN 1000 MCG/ML IJ SOLN
1000.0000 ug | Freq: Every day | INTRAMUSCULAR | Status: DC
  Administered 2019-08-24 – 2019-08-28 (×5): 1000 ug via INTRAMUSCULAR
  Filled 2019-08-24 (×7): qty 1

## 2019-08-24 MED ORDER — PRISMASOL BGK 0/2.5 32-2.5 MEQ/L IV SOLN
INTRAVENOUS | Status: DC
  Administered 2019-08-24 – 2019-08-25 (×10): via INTRAVENOUS_CENTRAL
  Filled 2019-08-24 (×21): qty 5000

## 2019-08-24 NOTE — Progress Notes (Signed)
   PM rounds - nasal tamponade remove - no NG tube for 1 week per ENT  = now on CRRT - Went to IR and had OG placed -  A bit more awake - eeg pending  - HEad CT  - non contributory  Plan - contnue vent, crrt  - start enteral lactulose - hopefully wakes up and we can extubate  Brother Edward Rocha updated   Ct Head Wo Contrast  Result Date: 08/24/2019 CLINICAL DATA:  Encephalopathy.  Seizure like activity EXAM: CT HEAD WITHOUT CONTRAST TECHNIQUE: Contiguous axial images were obtained from the base of the skull through the vertex without intravenous contrast. COMPARISON:  None. FINDINGS: Brain: No evidence of acute infarction, hemorrhage, hydrocephalus, extra-axial collection or mass lesion/mass effect. Mild atrophy and mild white matter changes most likely chronic microvascular ischemia. Vascular: Negative for hyperdense vessel Skull: Negative Sinuses/Orbits: Patient is intubated. Mild mucosal edema paranasal sinuses. Negative orbit Other: None IMPRESSION: No acute intracranial abnormality. Electronically Signed   By: Franchot Gallo M.D.   On: 08/24/2019 11:55   Dg Chest Port 1 View  Result Date: 08/24/2019 CLINICAL DATA:  Endotracheal tube positioning EXAM: PORTABLE CHEST 1 VIEW COMPARISON:  Chest radiograph dated 08/23/2019 FINDINGS: An endotracheal tube terminates in the midthoracic trachea. A right internal jugular vascular catheter tip overlies the superior vena cava. The cardiac and mediastinal silhouettes are unchanged. The lungs are clear. Vascular calcifications are seen in the aortic arch. IMPRESSION: Clear lungs.  Unchanged positioning of an endotracheal due. Aortic Atherosclerosis (ICD10-I70.0). Electronically Signed   By: Zerita Boers M.D.   On: 08/24/2019 15:40   Dg Addison Bailey G Tube Plc W/fl W/rad  Result Date: 08/24/2019 CLINICAL DATA:  OG tube placement requested on intubated patient. EXAM: NASO G TUBE PLACEMENT WITH FL AND WITH RAD CONTRAST:  10 cc Omnipaque iodinated contrast  administered via OG-tube. FLUOROSCOPY TIME:  Fluoroscopy Time:  4 minutes 24 seconds Radiation Exposure Index (if provided by the fluoroscopic device): 64.5 mGy Number of Acquired Spot Images: 0 COMPARISON:  08/22/2019 abdominal radiograph FINDINGS: A 12 Fr OG tube was successfully advanced into the distal stomach under fluoroscopic guidance with the tip verified in the region of the pylorus. IMPRESSION: Successful OG tube placement into the distal stomach under fluoroscopic guidance. Tip verified in the region of the pylorus. Tube ready for use. Electronically Signed   By: Ilona Sorrel M.D.   On: 08/24/2019 16:03      SIGNATURE    Dr. Brand Males, M.D., F.C.C.P,  Pulmonary and Critical Care Medicine Staff Physician, [place] Director - Interstitial Lung Disease  Program  Pulmonary [place] at [place], [place], [address]  Pager: [phone], If no answer or between  15:00h - 7:00h: call [phone] Telephone: [phone]  4:49 PM 08/24/2019

## 2019-08-24 NOTE — Progress Notes (Signed)
eLink Physician-Brief Progress Note Patient Name: Edward Rocha. DOB: 1966/07/13 MRN: [id number]   Date of Service  08/24/2019  HPI/Events of Note  Bedside nurse feels that the patient is uncomfortable. Only on PRN sedation and requests continuous sedative infusion.   eICU Interventions  Will order: 1. Low dose Precedex IV infusion. Titrate to RASS = 0.     Intervention Category Major Interventions: Delirium, psychosis, severe agitation - evaluation and management  [name] 08/24/2019, 4:32 AM

## 2019-08-24 NOTE — Progress Notes (Signed)
eLink Physician-Brief Progress Note Patient Name: Edward Rocha. DOB: 1966/10/26 MRN: [id number]   Date of Service  08/24/2019  HPI/Events of Note  Diarrhea d/t Lactulose. Request for Flexiseal.  eICU Interventions  Will order: 1. Place Flexiseal.      Intervention Category Major Interventions: Other:  Sommer,Steven Cornelia Copa 08/24/2019, 1:47 AM

## 2019-08-24 NOTE — Progress Notes (Signed)
Patient ID: Edward Rocha., male   DOB: 04-Dec-1965, 53 y.o.   MRN: [id number]  No more bleeding.  I deflated the Foley and then removed it.  I observed him for several minutes and there is no evidence of any bleeding.  An orogastric tube is a reasonable option.  I would avoid a nasogastric tube for another week.  Call if needed.

## 2019-08-24 NOTE — Progress Notes (Signed)
Patient taken to radiology for OG placement. Transported on vent down & back with no complications.  Ashley Mariner RRT

## 2019-08-24 NOTE — Progress Notes (Signed)
[facility]    NEPHROLOGY PROGRESS NOTE  SUBJECTIVE: Patient sedated, on the ventilator for airway protection.  Unable to provide review of systems.  Did not tolerate full HD treatment yesterday due to hypotension.  Only had a little over an hour.  OBJECTIVE:  Vitals:   08/24/19 1000 08/24/19 1143  BP: 134/80   Pulse: (!) 101   Resp: 18   Temp:  98.6 F (37 C)  SpO2: 100%     Intake/Output Summary (Last 24 hours) at 08/24/2019 1354 Last data filed at 08/24/2019 0800 Gross per 24 hour  Intake 1089.03 ml  Output -440 ml  Net 1529.03 ml      General: Sedated NAD HEENT: MMM [place] AT anicteric sclera, orally intubated Neck:  No JVD, no adenopathy, right IJ temp cath placed 10/18 CV:  Heart RRR  Lungs:  L/S CTA bilaterally Abd:  abd SNT/ND with normal BS GU:  Bladder non-palpable Extremities: +1 bilateral lower extremity edema Skin:  No skin rash  MEDICATIONS:  . sodium chloride   Intravenous Once  . sodium chloride   Intravenous Once  . chlorhexidine gluconate (MEDLINE KIT)  15 mL Mouth Rinse BID  . Chlorhexidine Gluconate Cloth  6 each Topical Daily  . cyanocobalamin  1,000 mcg Intramuscular Daily  . darbepoetin (ARANESP) injection - NON-DIALYSIS  150 mcg Subcutaneous Q Mon-1800  . folic acid  1 mg Intravenous Daily  . insulin aspart  0-9 Units Subcutaneous Q4H  . insulin glargine  5 Units Subcutaneous QPM  . lactulose  30 g Per Tube TID  . lactulose  300 mL Rectal BID  . mouth rinse  15 mL Mouth Rinse 10 times per day  . multivitamin  15 mL Per Tube Daily  . pantoprazole (PROTONIX) IV  40 mg Intravenous Q24H  . sodium chloride flush  10-40 mL Intracatheter Q12H  . sodium chloride flush  3 mL Intravenous Q12H  . sodium polystyrene  30 g Rectal Once  . thiamine  100 mg Oral Daily   Or  . thiamine  100 mg Intravenous Daily  . [START ON 08/31/2019] vitamin B-12  1,000 mcg Oral Daily       LABS:   CBC Latest Ref Rng & Units 08/24/2019 08/23/2019  08/23/2019  WBC 4.0 - 10.5 K/uL 36.7(H) 36.2(H) 34.3(H)  Hemoglobin 13.0 - 17.0 g/dL 7.7(L) 6.9(LL) 7.0(L)  Hematocrit 39.0 - 52.0 % 24.3(L) 22.5(L) 23.2(L)  Platelets 150 - 400 K/uL 61(L) 86(L) 84(L)    CMP Latest Ref Rng & Units 08/24/2019 08/23/2019 08/22/2019  Glucose 70 - 99 mg/dL 230(H) 173(H) -  BUN 6 - 20 mg/dL 117(H) 111(H) -  Creatinine 0.61 - 1.24 mg/dL 6.06(H) 5.02(H) -  Sodium 135 - 145 mmol/L 136 141 141  Potassium 3.5 - 5.1 mmol/L 5.5(H) 5.6(H) 4.8  Chloride 98 - 111 mmol/L 104 108 -  CO2 22 - 32 mmol/L 20(L) 20(L) -  Calcium 8.9 - 10.3 mg/dL 8.1(L) 8.2(L) -  Total Protein 6.5 - 8.1 g/dL 6.2(L) 6.6 -  Total Bilirubin 0.3 - 1.2 mg/dL 4.1(H) 2.5(H) -  Alkaline Phos 38 - 126 U/L 96 86 -  AST 15 - 41 U/L 454(H) 86(H) -  ALT 0 - 44 U/L 111(H) 30 -    Lab Results  Component Value Date   CALCIUM 8.1 (L) 08/24/2019   CAION 1.14 (L) 08/22/2019   PHOS 6.9 (H) 08/23/2019       Component Value Date/Time   COLORURINE AMBER (A) 08/13/2019 1309  APPEARANCEUR CLOUDY (A) 08/13/2019 1309   LABSPEC 1.014 08/13/2019 1309   PHURINE 5.0 08/13/2019 1309   GLUCOSEU >=500 (A) 08/13/2019 1309   HGBUR LARGE (A) 08/13/2019 1309   BILIRUBINUR NEGATIVE 08/13/2019 1309   KETONESUR NEGATIVE 08/13/2019 1309   PROTEINUR 100 (A) 08/13/2019 1309   NITRITE NEGATIVE 08/13/2019 1309   LEUKOCYTESUR NEGATIVE 08/13/2019 1309      Component Value Date/Time   PHART 7.378 08/23/2019 0431   PCO2ART 37.8 08/23/2019 0431   PO2ART 127 (H) 08/23/2019 0431   HCO3 21.7 08/23/2019 0431   TCO2 25 08/22/2019 1644   ACIDBASEDEF 2.6 (H) 08/23/2019 0431   O2SAT 98.5 08/23/2019 0431       Component Value Date/Time   IRON 170 05/29/2019 1446   TIBC 332 05/29/2019 1446   FERRITIN 570 (H) 05/29/2019 1446   IRONPCTSAT 51 (H) 05/29/2019 1446       ASSESSMENT/PLAN:    1.AKI (baseline crt ~1) - multifactorial in the setting of ischemic ATN with ABLA and relative hypotension, contrast nephropathy and BOO.  Ultimately required initiation of CRRT on 10/18(temp cath placed)due to profound volume overloadafter failing high doses of Lasix and metolazone.Labs and volume status greatly improved as a result of CRRT which was discontinued 10/21 at 15:30.  Intermittent HD treatments on 08/23/2019 for hyperkalemia.  Did not tolerate well due to hypotension.  Treatment was ended early.  We will plan to start CRRT with persistently elevated potassium. 2. Acute hypoxic respiratory failure - volume overload vs multifocal pneumonia.  Reintubated on Friday for airway protection in the setting of concern for GI bleed versus nosebleed.  Oxygenation now stable. 3. ABLA -transfuse as needed. Continue ESA 4. Hyponatremia due to hypervolemia -resolved 5. HTN -BP elevated.  Labetalol added as needed.  Agree.  6. Thrombocytopenia - chronic and worked up by hematology without etiology. HIT panel negative. On heparin. 7. DM type 2 - poorly controlled. Holding Invokana due to AKI. SSI as needed  8. Elytes - K slightly elevated, restart CRRT; corrected calcium ok;  Phos improved. 9. Metabolic acidosis - due to acute renal failure;resolved with CRRT,    [employer], DO, FACP

## 2019-08-24 NOTE — Progress Notes (Signed)
Lactulose enema administered as ordered. 300 mL Lactulose diluted with 700 mL sterile water to create 1000 mL Lactulose enema solution. Enema administered via rectal tube with balloon. Patient leaked small portion of enema during administration but maintained the majority. Patient leaked some more during turns after enema was completely administered while trying to pad bed with extra linens. Enema to remain in place as patient can tolerate for up to 60 mins.   Patient noted to expel a few small blood clots from anus when initially attempting to place rectal tube. Patient tolerated procedure well without complication at this time.

## 2019-08-24 NOTE — Progress Notes (Signed)
   Recent Labs  Lab 08/20/19 0354  08/21/19 0504 08/21/19 1709 08/22/19 0431 08/22/19 0437 08/22/19 1309 08/22/19 1313 08/22/19 1524 08/22/19 1644 08/23/19 0539 08/24/19 0758  NA 135   < > 135 137 138  --  139 142 138 141 141 136  K 3.7   < > 3.8 4.1 4.3  --  4.0 4.1 4.6 4.8 5.6* 5.5*  CL 101   < > 103 104 105  --  106  --  105  --  108 104  CO2 24   < > 21* 22 22  --  22  --  23  --  20* 20*  GLUCOSE 98   < > 192* 257* 267*  --  250*  --  251*  --  173* 230*  BUN 36*   < > 59* 72* 81*  --  92*  --  96*  --  111* 117*  CREATININE 2.01*   < > 3.06* 3.13* 3.32*  --  3.47*  --  3.65*  --  5.02* 6.06*  CALCIUM 8.1*   < > 7.8* 8.1* 8.1*  --  8.3*  --  8.1*  --  8.2* 8.1*  MG 2.8*  --  2.8*  --   --  3.2*  --   --  3.5*  --  3.6*  --   PHOS 2.1*   < > 2.2* 2.6 3.0  --  3.4  --  4.8*  --  6.9*  --    < > = values in this interval not displayed.     Renal function worse K rising Bid 20 Cannot take anything via oral route  Plan  - OG tube via IR (d/w Dr Clovis Riley)  - kayexalate enema - bic gtt  - RN to call renal - consider CRRT 08/24/2019      SIGNATURE    Dr. Brand Males, M.D., F.C.C.P,  Pulmonary and Critical Care Medicine Staff Physician, [place] Director - Interstitial Lung Disease  Program  Pulmonary [place] at [place], [place], [address]  Pager: [phone], If no answer or between  15:00h - 7:00h: call [phone] Telephone: [phone]  10:59 AM 08/24/2019

## 2019-08-24 NOTE — Progress Notes (Signed)
[facility]  Subjective  -did not tolerate CRRT very well yesterday.  Became tachycardic and hypotensive.  This had to be stopped early.   On Friday ultimately underwent EGD and had some varices banded.  Ultimately had a Foley catheter placed and is nasopharynx for tamponade of the hemorrhage.  Suspect he has cirrhosis now given varices on exam with history of alcohol abuse reported by his wife as well as since previous ultrasound evidence of nodular liver.  Foley catheter removed from nasopharynx today.  Objective 134/80 (!) 101 98.1 F (36.7 C) (Axillary) 18 100%  Intake/Output Summary (Last 24 hours) at 08/24/2019 1103 Last data filed at 08/24/2019 0800 Gross per 24 hour  Intake 1163.96 ml  Output -425 ml  Net 1588.96 ml   Intubated, not following commands Foley in right nare for tamponade of bleeding Left PT brisk signal Right Dp signal monophasic Left groin incision c/d/i Left leg incisions c/d/i - some serous drainage Abdomen soft  Laboratory Lab Results: Recent Labs    08/23/19 1325 08/24/19 0758  WBC 36.2* 36.7*  HGB 6.9* 7.7*  HCT 22.5* 24.3*  PLT 86* 61*   BMET Recent Labs    08/23/19 0539 08/24/19 0758  NA 141 136  K 5.6* 5.5*  CL 108 104  CO2 20* 20*  GLUCOSE 173* 230*  BUN 111* 117*  CREATININE 5.02* 6.06*  CALCIUM 8.2* 8.1*    COAG Lab Results  Component Value Date   INR 1.4 (H) 08/22/2019   INR 1.5 (H) 08/22/2019   INR 1.2 08/07/2019   No results found for: PTT  Assessment/Planning:  Postop day 13 status post left common femoral to PT bypass with composite PTFE and GSV. Continues to have a very brisk posterior tibial signal in the left foot.    Neuro: Now intubated for airway protection after bleeding from his nasopharynx Friday.  Not following commands but moving all extremities.  Ct head today? Significant mental issues postop.  Initially raised the question of withdrawal.  He is on CIWA  protocol now.  Also probably has a component of hepatic encephalopathy given evidence of cirrhosis.  We had him on lactulose but no enteral access - getting it via enema. [id number] post-op.  Hgb 7.7 today, got 1 upRBC yesterday,  Platelets 61. Pulm: Intubated.  Minimal vent settings.  Mental status limiting extubation. GI: N.p.o.  No enteral access now that NG was removed after nasal pharynx bleed Friday.  Right naris packed with Foley catheter for balloon tamponade.  Appreciate ENT and GI seeing him.  We have stopped his aspirin Plavix. ENT removed balloon in nare.  OK for oropharynx tube. Renal: AKI post-op progressed to oliguric renal failure.  Tried aggressive diuresis with high dose lasix and no improvement with worsening respiratory status.  CRRT and pulled off 10 L.  Nephrology attempted CRRT again yesterday. Has chronic thrombocytopenia that was worked up by hematology prior to surgery and previously had trial of steroids with no improvement. Platelets are 61.  I now suspect he has a component of cirrhosis given varices on EGD yesterday with chronically low platelets elevated liver enzymes and nodular findings on previous liver ultrasound.  Wife now states heavy ETOH abuse prior to surgery. ID: WBC up to 36 today.  Broadened antibiotics Friday.  Now on Vanc and cefepime.  Cultures not growing anything.  Afebrile FEN: Remain saline locked.  Appreciate nephrology input.  Remains critically ill in the ICU.  Updated his wife by phone  again.  Discussed that his prognosis is poor.  His bypass was fairly uneventful but he has done very poorly postop.  Initially had renal failure with volume overload requiring CRRT.  Now he has had nasopharyngeal bleed requiring intubation for airway protection.  Chronically low platelets and elevated liver enzymes raises the question of cirrhosis and certainly could be a factor in his renal failure after surgery.  Questions hepatorenal syndrome with worsening liver enzymes  now and hepatic encephalopathy.   [name] 08/24/2019 11:03 AM --

## 2019-08-24 NOTE — Progress Notes (Signed)
Pt transported on vent from 2H16 to CT and back without any complications.

## 2019-08-24 NOTE — Progress Notes (Signed)
On call Nephrologist notified and updated regarding clarification of net fluid removal order. Per nursing report net fluid removal 100 mL; current order is for 25 mL net fluid removal. CVP noted 3 - 4 but SBP has improved to 130-150s. Advised by on call nephrologist to follow current order of 25 mL net fluid removal as CVP remains 3 - 4. Adjustment made to CRRT accordingly.

## 2019-08-24 NOTE — Progress Notes (Signed)
NAME:  Edward Shalhoub., MRN:  [id number], DOB:  07/28/1966, LOS: 67 ADMISSION DATE:  08/11/2019, CONSULTATION DATE:  08/17/19, CHIEF COMPLAINT:  SOB  Brief History    53 yo with history of DM, HTN, HLD, thrombocytopenia, and PAD s/p aortogram with left iliac arteriogram prior to left femoral to PTA bypass graft placement on 08/11/19.  Developed oliguric renal failure, CCM consulted 10/18 due to worsening hypoxia (  volume overload vs pneumonia) . Pt was treated with CVVHD x 2 days 10/19-10/21. PCCM had signed off on 10/21. 10/23 PCCM were re consulted for emergent intubation in setting of vomiting BRB emesis of 600 cc's decreased mental status and concern for airway protection. Pt. Was emergently intubated by Dr. Maryclare Bean . PCCM will follow for vent management   Past Medical History  DM HTN HLD Thrombocytopenia PAD AKI  Significant Hospital Events   PTA on 08/11/19   10/23 - Re-consulted 10/23 for emergent intubation in setting of bloody emesis and inability to protect airway.  Full support ABG pending 600 cc of BRB emesis with clots GI consulted by Vascular surgery Decreased mental status   10/24 - intubated yesterday in setting of massive GI v ENT hemorrhag v both. OGT places yresterday (known chronic low platelets idiopathich and unresponsive to steroids with allergic rxn to platelets 1 month ago). GI scopy yesterday - s/p banding of 2 non-bleeding varices. Bleeding not thought to be from esophagus or stomatch -> ENT cx -> Procedures: A 30 cc Foley catheter was inserted into the right side of the nose and passed all the way into the oropharynx.  Direct visualization was used to confirm the position of the balloon.  The balloon was then inflated with 30 cc of saline.  It was then pulled back into the nasopharynx and secured with a clamp.  After this maneuver there was no further bleeding identified -> bilateral nasal packing.  Oropharyngeal bleeding due to NG tubethought to be cause On  100% fio2 -> RASS -4 on fent gtt.Wife - [name] witness - does not want PRBC unless we have to  Consults:  PCCM Nephro Surgery  Procedures:  RIJ HD cath 10/18 >> 10/23 - ETT 120/25 - flexiseal following lactulse enema  Significant Diagnostic Tests:  10/14  renal ultrasound >> no hnosis  Micro Data:  Covid negative. 10/18 BC >>ng 10/23 Blood 10/23 Sputum Antimicrobials:  10/16  Cefepime >> 10/23 Maxipime>> 10/23 Vanc >>  Interim history/subjective:   10/25 - s/p 1 U PRBC  Yesterday (wife is [name] witness and is requesting a cautious approach to transfusion - transfuse only when needed + minimal units + conserve blood + irone/b12 supplementation). Patient started on precedex gtt overnight due to vent dysnchrony. Flexiseal placed after lactulose enena . IRON started . Still on octreotide. Plan is for partial balloon deflation today  Currently off precedex -> unresponsive x 24h without long acting sedation. Has chewing movement of lips and RN says he bites on ET tube. No GI access - per Dr Constance Holster - probably safe to put an OG (injury was in NP area)    Objective   Blood pressure 101/61, pulse 92, temperature 98.1 F (36.7 C), temperature source Axillary, resp. rate 18, height 6\' 1"  (1.854 m), weight 88.7 kg, SpO2 100 %. CVP:  [3 mmHg] 3 mmHg  Vent Mode: PRVC FiO2 (%):  [40 %] 40 % Set Rate:  [18 bmp] 18 bmp Vt Set:  [630 mL] 630 mL PEEP:  [5 cmH20] 5 cmH20  Plateau Pressure:  [16 cmH20-24 cmH20] 19 cmH20   Intake/Output Summary (Last 24 hours) at 08/24/2019 0839 Last data filed at 08/24/2019 0600 Gross per 24 hour  Intake 1158.49 ml  Output -460 ml  Net 1618.49 ml   Filed Weights   08/22/19 0300 08/23/19 1800 08/23/19 2036  Weight: 88.9 kg 88.2 kg 88.7 kg     General Appearance:  Looks criticall il Head:  Normocephalic, without obvious abnormality, atraumatic Eyes:  PERRL - yes, conjunctiva/corneas - mddy     Ears:  Normal external ear canals, both ears Nose:   G tube - no. Both nostrils packed and rt with balloon cath Throat:  ETT TUBE - yes , OG tube - NO Neck:  Supple,  No enlargement/tenderness/nodules Lungs: Clear to auscultation bilaterally, Ventilator   Synchrony - yes Heart:  S1 and S2 normal, no murmur, CVP - no.  Pressors - no Abdomen:  Soft, no masses, no organomegaly Genitalia / Rectal:  Not done Extremities:  Extremities- intact Skin:  ntact in exposed areas . Sacral area - FLEXISEAL Neurologic:  Sedation - none -> RASS - -4 . Moves all 4s - yess occ. CAM-ICU - unable to assess . Orientation - unable to assess. CHEWING MOVEMENT in [facility] Problem list   NA   Assessment & Plan:   Acute Emergent  intubation for airway protection  Due to bleed from NP/OP area - stopped with ballloon tamponade 08/22/2019   08/24/2019 - > does not meet criteria for SBT/Extubation in setting of Acute Respiratory Failure due to encephalopathy   Plan - PRVC  - HOB > 30 degres  Massive hemorrhagge - initial 10/23  - unclear hematemsis versys hemoptysis - ended up as OP teear bleed. S/p non-bleeding UGI varices banding.  S/p bilateral nasal pacing and Rt nostril balloon tamponade that stopped the bleeding   08/24/2019 - no active bleed . Still on octreotide. ENT plans partial balloon deflation today  Plan  - monitoring by ENT -- ok for octreotide for now    HTN -  X 2 days prior to bleedin prior to 08/22/2019  Plan Lobetalol 5 mg prn for SBP > 150 Avoid hypertension    AKI favor ischemic ATN due to hypotension + contrast insult : CVVHD 10/19-10/21  08/24/2019 - did not tolerate HD last ngith  Plan  - per renal; they might consider CRRT - MAP goal is > 65 mm Hg -   Acute encephalopathy (Per wife pt  is a heavy drinker ( drinks two 40's per day)   - 10/25 -  had gotten cofnused from 08/20/2019 onwards and Responded to lactulose. Post intubation 08/22/2019 has remained comatose has lip movements on  08/24/2019. No sedation gttt x 24h    Plan Avoidfent gtt fent prn fersed prn Lactulose enema in absence  of PO route - to continue -> change to po when OG tube + Get CT head Get EEG Neuro consult depending abovve   Thrombocytopenia appears to be chronic,- Platelets in the 70,000 range  08/24/2019 - pending labs but had improved 08/23/2019  Plan Trend CBC every few days + clinical (wife being [name] wants conservative approach) Monitor for bleeding Transfuse as needed   Anemia of critical illnss and bleeding - s/p 1 unit PRBC 08/23/2019 for hgb  6.9 (wife Jevohah Witness)   08/24/2019 -cbc pending  - PRBC for hgb </= 6.9gm%  -  - Iron from 08/23/2019 per pharmacy at  wife request  B12 from 08/24/2019 per pharmacy at wife requiest - Aranesp to continue   Concern for aspiration s/- 3 days abx ending 08/23/2019  08/24/2019 - no fever  Plan   -monitor off all abx   Cirrhosis  Of liver  - likely etoh -with varices  PLAn  - lactulose enena - GI following - thiamine and folate IV  - Inability to take PO  -  08/24/2019 - ENT ok with OG tube placement (cannot be NG)  Plan  - IR consult for OG tube   Best practice:  Diet: NPO for now Pain/Anxiety/Delirium protocol (if indicated): per primary VAP protocol (if indicated):Initiated DVT prophylaxis: SCD (? Heparin for CRRT when restarted) GI prophylaxis: Protonix Mobility: As toelrated Code Status: Full for now Family Communication:  Wife gets updates from [place] (she was updated by MD 08/23/2019) . She is asking for conservatio of blood and Rx anemia with medications such as iron, b12, aranesp . Be judicious with PRBC  Disposition: ICU     ATTESTATION & SIGNATURE   The patient Zerrick Urwin. is critically ill with multiple organ systems failure and requires high complexity decision making for assessment and support, frequent evaluation and titration of therapies, application of advanced monitoring technologies  and extensive interpretation of multiple databases.   Critical Care Time devoted to patient care services described in this note is  40  Minutes. This time reflects time of care of this signee Dr Brand Males. This critical care time does not reflect procedure time, or teaching time or supervisory time of PA/NP/Med student/Med Resident etc but could involve care discussion time     Dr. Brand Males, M.D., [facility].C.P Pulmonary and Critical Care Medicine Staff Physician [place] [facility] Pager: [phone], If no answer or between  15:00h - 7:00h: call [phone]  08/24/2019 9:17 AM    LABS    PULMONARY Recent Labs  Lab 08/17/19 2058 08/22/19 1313 08/22/19 1644 08/23/19 0431  PHART 7.407 7.480* 7.345* 7.378  PCO2ART 32.2 30.1* 43.6 37.8  PO2ART 66.0* 81.0* 273.0* 127*  HCO3 20.2 22.4 23.7 21.7  TCO2 21* 23 25  --   O2SAT 93.0 97.0 100.0 98.5    CBC Recent Labs  Lab 08/23/19 0106 08/23/19 0539 08/23/19 1325  HGB 7.1* 7.0* 6.9*  HCT 22.6* 23.2* 22.5*  WBC 33.9* 34.3* 36.2*  PLT 85* 84* 86*    COAGULATION Recent Labs  Lab 08/22/19 1309  INR 1.5*   1.4*    CARDIAC  No results for input(s): TROPONINI in the last 168 hours. No results for input(s): PROBNP in the last 168 hours.   CHEMISTRY Recent Labs  Lab 08/20/19 0354  08/21/19 0504 08/21/19 1709 08/22/19 0431 08/22/19 0437 08/22/19 1309 08/22/19 1313 08/22/19 1524 08/22/19 1644 08/23/19 0539  NA 135   < > 135 137 138  --  139 142 138 141 141  K 3.7   < > 3.8 4.1 4.3  --  4.0 4.1 4.6 4.8 5.6*  CL 101   < > 103 104 105  --  106  --  105  --  108  CO2 24   < > 21* 22 22  --  22  --  23  --  20*  GLUCOSE 98   < > 192* 257* 267*  --  250*  --  251*  --  173*  BUN 36*   < > 59* 72*  81*  --  92*  --  96*  --  111*  CREATININE 2.01*   < > 3.06* 3.13* 3.32*  --  3.47*  --  3.65*  --  5.02*  CALCIUM 8.1*   < > 7.8* 8.1* 8.1*  --  8.3*  --  8.1*  --  8.2*    MG 2.8*  --  2.8*  --   --  3.2*  --   --  3.5*  --  3.6*  PHOS 2.1*   < > 2.2* 2.6 3.0  --  3.4  --  4.8*  --  6.9*   < > = values in this interval not displayed.   Estimated Creatinine Clearance: 18.8 mL/min (A) (by C-G formula based on SCr of 5.02 mg/dL (H)).   LIVER Recent Labs  Lab 08/22/19 0431 08/22/19 0437 08/22/19 1309 08/22/19 1524 08/23/19 0539  AST  --  69*  --  63* 86*  ALT  --  28  --  27 30  ALKPHOS  --  90  --  88 86  BILITOT  --  2.1*  --  2.0* 2.5*  PROT  --  7.1  --  7.1 6.6  ALBUMIN 1.8* 1.8* 1.8* 1.8* 1.7*  INR  --   --  1.5*   1.4*  --   --      INFECTIOUS Recent Labs  Lab 08/20/19 0354 08/22/19 1524 08/23/19 0539  LATICACIDVEN  --  2.0* 3.1*  PROCALCITON 1.00 0.55 0.60     ENDOCRINE CBG (last 3)  Recent Labs    08/23/19 2349 08/24/19 0341 08/24/19 0808  GLUCAP 137* 163* 209*         IMAGING x48h  - image(s) personally visualized  -   highlighted in bold Dg Chest Port 1 View  Result Date: 08/23/2019 CLINICAL DATA:  Hypoxia EXAM: PORTABLE CHEST 1 VIEW COMPARISON:  August 22, 2019 FINDINGS: Endotracheal tube tip is 5.6 cm above the carina. Central catheter tip is in the superior vena cava. Nasogastric tube and feeding tube have been removed. No pneumothorax. There is no edema or consolidation. Heart size and pulmonary vascularity are normal. No adenopathy. There is aortic atherosclerosis. No evident bone lesions. IMPRESSION: Tube and catheter positions as described without pneumothorax. No edema or consolidation. Stable cardiac silhouette. Aortic Atherosclerosis (ICD10-I70.0). Electronically Signed   By: Lowella Grip III M.D.   On: 08/23/2019 12:24   Portable Chest X-[place]  Result Date: 08/22/2019 CLINICAL DATA:  Hemoptysis.  Post intubation. EXAM: PORTABLE CHEST 1 VIEW COMPARISON:  Chest x-Bradt from same day. FINDINGS: Interval placement of an endotracheal tube with the tip 3.1 cm above the carina. Unchanged feeding tube tip in the  gastric fundus. Interval placement of a second enteric tube with the tube looped back on itself in the tip in the mid esophagus. Unchanged right internal jugular central venous catheter. The heart size and mediastinal contours are within normal limits. Diffuse central peribronchial thickening. Increasing density at the right lung base with new small right pleural effusion. No pneumothorax. No acute osseous abnormality. IMPRESSION: 1. Appropriately positioned endotracheal tube. 2. New enteric tube coiled in the esophagus. Recommend repositioning. 3. Diffuse central peribronchial thickening with increasing right basilar opacity that could reflect aspiration. 4. New small right pleural effusion. Electronically Signed   By: Titus Dubin M.D.   On: 08/22/2019 14:39   Dg Chest Port 1 View  Result Date: 08/22/2019 CLINICAL DATA:  Status post feeding tube placement today. EXAM: PORTABLE  CHEST 1 VIEW COMPARISON:  Single-view of the chest 08/19/2019. FINDINGS: New feeding tube is in place with its tip in the fundus of the stomach. Right IJ catheter tip projects at the superior cavoatrial junction. Lungs are clear. No pneumothorax or pleural effusion. Aortic atherosclerosis noted. Remote upper right rib fractures are noted IMPRESSION: Feeding tube tip is in the fundus of the stomach. Right IJ catheter tip projects at the superior cavoatrial junction. Lungs clear.  No pneumothorax. Electronically Signed   By: Inge Rise M.D.   On: 08/22/2019 13:12   Dg Abd Portable 1v  Result Date: 08/22/2019 CLINICAL DATA:  New enteric tube placement. EXAM: PORTABLE ABDOMEN - 1 VIEW COMPARISON:  Abdominal x-Stickley from same day. FINDINGS: Interval exchange of the enteric tube for a new feeding tube in the stomach. The bowel gas pattern is normal. No radio-opaque calculi or other significant radiographic abnormality are seen. Prior iliac stents. No acute osseous abnormality. Unchanged small calcification in the right upper  quadrant corresponding to a gallstone based on prior CT. IMPRESSION: Feeding tube in the stomach. Electronically Signed   By: Titus Dubin M.D.   On: 08/22/2019 14:41     Anti-infectives (From admission, onward)   Start     Dose/Rate Route Frequency Ordered Stop   08/22/19 1200  vancomycin (VANCOCIN) 1,250 mg in sodium chloride 0.9 % 250 mL IVPB  Status:  Discontinued     1,250 mg 166.7 mL/hr over 90 Minutes Intravenous Every 24 hours 08/22/19 1149 08/23/19 0907   08/20/19 2200  ceFEPIme (MAXIPIME) 2 g in sodium chloride 0.9 % 100 mL IVPB     2 g 200 mL/hr over 30 Minutes Intravenous Every 24 hours 08/20/19 1322 08/22/19 2310   08/18/19 1157  ceFEPIme (MAXIPIME) 2 g in sodium chloride 0.9 % 100 mL IVPB  Status:  Discontinued     2 g 200 mL/hr over 30 Minutes Intravenous Every 24 hours 08/17/19 1719 08/17/19 2220   08/18/19 1000  ceFEPIme (MAXIPIME) 2 g in sodium chloride 0.9 % 100 mL IVPB  Status:  Discontinued     2 g 200 mL/hr over 30 Minutes Intravenous Every 12 hours 08/17/19 2220 08/20/19 1322   08/16/19 1115  ceFEPIme (MAXIPIME) 2 g in sodium chloride 0.9 % 100 mL IVPB  Status:  Discontinued     2 g 200 mL/hr over 30 Minutes Intravenous Every 12 hours 08/16/19 1114 08/17/19 1719   08/11/19 2000  ceFAZolin (ANCEF) IVPB 2g/100 mL premix     2 g 200 mL/hr over 30 Minutes Intravenous Every 8 hours 08/11/19 1750 08/12/19 0335   08/11/19 0550  ceFAZolin (ANCEF) IVPB 2g/100 mL premix     2 g 200 mL/hr over 30 Minutes Intravenous 30 min pre-op 08/11/19 0550 08/11/19 1136

## 2019-08-25 ENCOUNTER — Inpatient Hospital Stay (HOSPITAL_COMMUNITY): Payer: Managed Care, Other (non HMO)

## 2019-08-25 DIAGNOSIS — J9600 Acute respiratory failure, unspecified whether with hypoxia or hypercapnia: Secondary | ICD-10-CM | POA: Diagnosis not present

## 2019-08-25 LAB — CBC WITH DIFFERENTIAL/PLATELET
Abs Immature Granulocytes: 0.68 10*3/uL — ABNORMAL HIGH (ref 0.00–0.07)
Basophils Absolute: 0.1 10*3/uL (ref 0.0–0.1)
Basophils Relative: 0 %
Eosinophils Absolute: 0.3 10*3/uL (ref 0.0–0.5)
Eosinophils Relative: 1 %
HCT: 26.1 % — ABNORMAL LOW (ref 39.0–52.0)
Hemoglobin: 8.1 g/dL — ABNORMAL LOW (ref 13.0–17.0)
Immature Granulocytes: 3 %
Lymphocytes Relative: 12 %
Lymphs Abs: 3.1 10*3/uL (ref 0.7–4.0)
MCH: 28.1 pg (ref 26.0–34.0)
MCHC: 31 g/dL (ref 30.0–36.0)
MCV: 90.6 fL (ref 80.0–100.0)
Monocytes Absolute: 2 10*3/uL — ABNORMAL HIGH (ref 0.1–1.0)
Monocytes Relative: 8 %
Neutro Abs: 19.3 10*3/uL — ABNORMAL HIGH (ref 1.7–7.7)
Neutrophils Relative %: 76 %
Platelets: 50 10*3/uL — ABNORMAL LOW (ref 150–400)
RBC: 2.88 MIL/uL — ABNORMAL LOW (ref 4.22–5.81)
RDW: 17.7 % — ABNORMAL HIGH (ref 11.5–15.5)
WBC: 25.5 10*3/uL — ABNORMAL HIGH (ref 4.0–10.5)
nRBC: 0.2 % (ref 0.0–0.2)

## 2019-08-25 LAB — COMPREHENSIVE METABOLIC PANEL
ALT: 155 U/L — ABNORMAL HIGH (ref 0–44)
ALT: 155 U/L — ABNORMAL HIGH (ref 0–44)
AST: 554 U/L — ABNORMAL HIGH (ref 15–41)
AST: 554 U/L — ABNORMAL HIGH (ref 15–41)
Albumin: 1.9 g/dL — ABNORMAL LOW (ref 3.5–5.0)
Albumin: 1.8 g/dL — ABNORMAL LOW (ref 3.5–5.0)
Alkaline Phosphatase: 105 U/L (ref 38–126)
Alkaline Phosphatase: 101 U/L (ref 38–126)
Anion gap: 12 (ref 5–15)
Anion gap: 10 (ref 5–15)
BUN: 73 mg/dL — ABNORMAL HIGH (ref 6–20)
BUN: 65 mg/dL — ABNORMAL HIGH (ref 6–20)
CO2: 23 mmol/L (ref 22–32)
CO2: 24 mmol/L (ref 22–32)
Calcium: 8.1 mg/dL — ABNORMAL LOW (ref 8.9–10.3)
Calcium: 8.1 mg/dL — ABNORMAL LOW (ref 8.9–10.3)
Chloride: 102 mmol/L (ref 98–111)
Chloride: 102 mmol/L (ref 98–111)
Creatinine, Ser: 4.32 mg/dL — ABNORMAL HIGH (ref 0.61–1.24)
Creatinine, Ser: 3.96 mg/dL — ABNORMAL HIGH (ref 0.61–1.24)
GFR calc Af Amer: 17 mL/min — ABNORMAL LOW (ref >60)
GFR calc Af Amer: 19 mL/min — ABNORMAL LOW (ref >60)
GFR calc non Af Amer: 14 mL/min — ABNORMAL LOW (ref >60)
GFR calc non Af Amer: 16 mL/min — ABNORMAL LOW (ref >60)
Glucose, Bld: 135 mg/dL — ABNORMAL HIGH (ref 70–99)
Glucose, Bld: 159 mg/dL — ABNORMAL HIGH (ref 70–99)
Potassium: 3.9 mmol/L (ref 3.5–5.1)
Potassium: 3.8 mmol/L (ref 3.5–5.1)
Sodium: 137 mmol/L (ref 135–145)
Sodium: 136 mmol/L (ref 135–145)
Total Bilirubin: 4 mg/dL — ABNORMAL HIGH (ref 0.3–1.2)
Total Bilirubin: 4.2 mg/dL — ABNORMAL HIGH (ref 0.3–1.2)
Total Protein: 8.1 g/dL (ref 6.5–8.1)
Total Protein: 7.7 g/dL (ref 6.5–8.1)

## 2019-08-25 LAB — RENAL FUNCTION PANEL
Albumin: 1.7 g/dL — ABNORMAL LOW (ref 3.5–5.0)
Anion gap: 10 (ref 5–15)
BUN: 51 mg/dL — ABNORMAL HIGH (ref 6–20)
CO2: 23 mmol/L (ref 22–32)
Calcium: 7.7 mg/dL — ABNORMAL LOW (ref 8.9–10.3)
Chloride: 106 mmol/L (ref 98–111)
Creatinine, Ser: 3.37 mg/dL — ABNORMAL HIGH (ref 0.61–1.24)
GFR calc Af Amer: 23 mL/min — ABNORMAL LOW (ref >60)
GFR calc non Af Amer: 19 mL/min — ABNORMAL LOW (ref >60)
Glucose, Bld: 131 mg/dL — ABNORMAL HIGH (ref 70–99)
Phosphorus: 5.4 mg/dL — ABNORMAL HIGH (ref 2.5–4.6)
Potassium: 3.6 mmol/L (ref 3.5–5.1)
Sodium: 139 mmol/L (ref 135–145)

## 2019-08-25 LAB — AMMONIA: Ammonia: 59 umol/L — ABNORMAL HIGH (ref 9–35)

## 2019-08-25 LAB — GLUCOSE, CAPILLARY
Glucose-Capillary: 128 mg/dL — ABNORMAL HIGH (ref 70–99)
Glucose-Capillary: 137 mg/dL — ABNORMAL HIGH (ref 70–99)
Glucose-Capillary: 140 mg/dL — ABNORMAL HIGH (ref 70–99)
Glucose-Capillary: 145 mg/dL — ABNORMAL HIGH (ref 70–99)
Glucose-Capillary: 156 mg/dL — ABNORMAL HIGH (ref 70–99)
Glucose-Capillary: 157 mg/dL — ABNORMAL HIGH (ref 70–99)

## 2019-08-25 LAB — MAGNESIUM
Magnesium: 3.2 mg/dL — ABNORMAL HIGH (ref 1.7–2.4)
Magnesium: 2.9 mg/dL — ABNORMAL HIGH (ref 1.7–2.4)

## 2019-08-25 LAB — CBC
HCT: 25.8 % — ABNORMAL LOW (ref 39.0–52.0)
Hemoglobin: 8.2 g/dL — ABNORMAL LOW (ref 13.0–17.0)
MCH: 28.4 pg (ref 26.0–34.0)
MCHC: 31.8 g/dL (ref 30.0–36.0)
MCV: 89.3 fL (ref 80.0–100.0)
Platelets: 45 10*3/uL — ABNORMAL LOW (ref 150–400)
RBC: 2.89 MIL/uL — ABNORMAL LOW (ref 4.22–5.81)
RDW: 17.9 % — ABNORMAL HIGH (ref 11.5–15.5)
WBC: 23.8 10*3/uL — ABNORMAL HIGH (ref 4.0–10.5)
nRBC: 0.2 % (ref 0.0–0.2)

## 2019-08-25 LAB — HEPATITIS B E ANTIBODY: Hep B E Ab: NEGATIVE

## 2019-08-25 LAB — CULTURE, RESPIRATORY W GRAM STAIN
Gram Stain: NONE SEEN
Special Requests: NORMAL

## 2019-08-25 LAB — PHOSPHORUS
Phosphorus: 5.7 mg/dL — ABNORMAL HIGH (ref 2.5–4.6)
Phosphorus: 5.3 mg/dL — ABNORMAL HIGH (ref 2.5–4.6)

## 2019-08-25 MED ORDER — VITAL AF 1.2 CAL PO LIQD
1000.0000 mL | ORAL | Status: DC
  Administered 2019-08-25 – 2019-08-26 (×3): 1000 mL

## 2019-08-25 MED ORDER — B COMPLEX-C PO TABS
1.0000 | ORAL_TABLET | Freq: Every day | ORAL | Status: DC
  Administered 2019-08-25 – 2019-08-26 (×2): 1 via ORAL
  Filled 2019-08-25 (×3): qty 1

## 2019-08-25 MED ORDER — LORAZEPAM 2 MG/ML IJ SOLN
INTRAMUSCULAR | Status: AC
  Administered 2019-08-25: 2 mg via INTRAVENOUS
  Filled 2019-08-25: qty 1

## 2019-08-25 MED ORDER — LORAZEPAM 2 MG/ML IJ SOLN
2.0000 mg | Freq: Once | INTRAMUSCULAR | Status: AC
  Administered 2019-08-25: 11:00:00 2 mg via INTRAVENOUS

## 2019-08-25 MED ORDER — PRO-STAT SUGAR FREE PO LIQD
30.0000 mL | Freq: Two times a day (BID) | ORAL | Status: DC
  Administered 2019-08-25 – 2019-08-29 (×7): 30 mL
  Filled 2019-08-25 (×6): qty 30

## 2019-08-25 NOTE — Progress Notes (Signed)
eLink Physician-Brief Progress Note Patient Name: Edward Rocha. DOB: 1966-04-17 MRN: [id number]   Date of Service  08/25/2019  HPI/Events of Note  Agitation - Request for bilateral soft wrist restraints and increased ceiling on Precedex.   eICU Interventions  Will order: 1. Bilateral soft wrist restraints.  2. Increase ceiling on Precedex to 1.0 mcg/kg/hour.     Intervention Category Major Interventions: Delirium, psychosis, severe agitation - evaluation and management  Moritz Lever Eugene 08/25/2019, 11:17 PM

## 2019-08-25 NOTE — Procedures (Signed)
Patient Name: Edward Rocha.  MRN: [id number]  Epilepsy Attending: Lora Havens  Referring Physician/Provider: Dr Brand Males Date: 08/24/2028 Duration: 29.15 minutes  Patient history: 53 year old male with altered mental status.  EEG to evaluate for seizures.  Level of alertness: Obtunded  AEDs during EEG study: Ativan  Technical aspects: This EEG study was done with scalp electrodes positioned according to the 10-20 International system of electrode placement. Electrical activity was acquired at a sampling rate of 500Hz  and reviewed with a high frequency filter of 70Hz  and a low frequency filter of 1Hz . EEG data were recorded continuously and digitally stored.   Description: EEG showed continuous generalized high amplitude 2 to 4 Hz delta slowing.  There are also generalized epileptiform discharges ( GPEDs)with triphasic morphology maximal bifrontal at 1.5 to 2 Hz.  IV Ativan 2 mg was administered at 1118 after which EEG showed continuous generalized low amplitude 2 to 3 Hz delta slowing and triphasic waves at the rate of 0.5 to 1 Hz. Hyperventilation and photic stimulation were not performed.  Abnormality -Generalized Epileptiform discharges with triphasic morphology, maximal bifrontal -Continuous slow, generalized  IMPRESSION: This study showed triphasic waves at the rate of 1.5 to 2 Hz which improved after administration of IV Ativan and are on the ictal-interictal continuum. No definite seizures were seen throughout the recording.  Additionally there is evidence of severe diffuse encephalopathy, could be secondary to toxic metabolic etiology.   Falyn Rubel Barbra Sarks

## 2019-08-25 NOTE — Consult Note (Addendum)
Neurology Consultation Reason for Consult: Confusion Referring Physician: Dr. Marshell Garfinkel  CC: Encephalopathy  History is obtained from chart review as patient is intubated and unable to provide history.  HPI: Edward Rocha. is a 53 y.o. male with history of hypertension, hyperlipidemia, DM, thrombocytopenia, peripheral artery disease status post aortogram with left iliac arteriogram prior to left femoral to PTA bypass graft placement on 08/11/19.  Postoperative course was complicated by oliguric renal failure progressing to anuric renal failure.  Critical care was consulted on 07/18/2019 due to increased work of breathing and subsequent intubation.  He has since been also diagnosed with liver cirrhosis and hyperammonemia, thrombocytopenia.  EEG was ordered due to continued altered mental status which showed generalized periodic epileptiform discharges with triphasic morphology, on the ictal-interictal continuum.  Therefore neurology was consulted.  No known history of seizure disorder.  Per bedside RN, patient was noted to have lip quivering and biting down on the ET tube, no generalized tonic-clonic seizure-like episode.  ROS: Unable to obtain due to altered mental status.   Past Medical History:  Diagnosis Date  . Acute renal failure (ARF) ([place])    12/2018 when admitted for hyperosmolar hyperglycemic nonketotic syndrome  . Bilateral pain of leg and foot   . Diabetes mellitus without complication ([place])    Type II  . Dizziness   . Elevated LFTs 05/2019  . Frequent urination   . Hyperglycemia    Non-Ketotic Hyperosmolar.  . Hyperlipidemia   . Hypernatremia   . Hypertension   . Peripheral vascular disease ([place])   . Syncope    12/2018, diagnosed with hyperosmolar hyperglycemic nonketotic syndrome, AKI, hyponatremia ([facility])  . Thrombocytopenia ([place])   . Thyroid disease    Hypothyroidism  . Weakness     Family History  Problem Relation Age of Onset  . Alcohol abuse  Father   . Cancer Mother   . Alcohol abuse Brother   . Bipolar disorder Son   . Bipolar disorder Daughter     Social History: Per chart review, he quit smoking about 6 months ago. His smoking use included cigarettes. He has a 36.00 pack-year smoking history. He has never used smokeless tobacco. He reports previous alcohol use. He reports that he does not use drugs.  Exam: Current vital signs: BP 140/88   Pulse 88   Temp (!) 95.4 F (35.2 C) (Axillary) Comment: bair hugger in place  Resp 14   Ht 6\' 1"  (1.854 m)   Wt 88.2 kg   SpO2 100%   BMI 25.65 kg/m  Vital signs in last 24 hours: Temp:  [95.4 F (35.2 C)-97.9 F (36.6 C)] 95.4 F (35.2 C) (10/26 0804) Pulse Rate:  [68-92] 88 (10/26 1300) Resp:  [12-24] 14 (10/26 1300) BP: (95-169)/(57-88) 140/88 (10/26 1300) SpO2:  [95 %-100 %] 100 % (10/26 1300) FiO2 (%):  [40 %] 40 % (10/26 1112) Weight:  [88.2 kg] 88.2 kg (10/26 0500)   Physical Exam  Constitutional: Appears well-developed and well-nourished.  Psych: Unable to evaluate secondary to intubation, encephalopathy Eyes: No scleral injection HENT: No OP obstrucion Head: Normocephalic.  Cardiovascular: Normal rate and regular rhythm.  Respiratory: +intubated Skin: warm, diffuse edema  Neuro: Mental Status: Opens eyes to noxious stimuli, does not track, does not follow commands  Cranial Nerves: PERRLA, oculocephalic reflex intact, corneal reflex intact, gag reflex intact Motor: Withdraws to noxious stimuli in all 4 extremities Deep Tendon Reflexes:1 symmetric in the biceps and patellae.   I have reviewed labs in  epic and the results pertinent to this consultation are: WBC 25.5 Hemoglobin 8.1 Platelets 15 BUN 73 Creatinine 1.32 Albumin 1.9 AST 554 ALT 155 Total bilirubin 4 Magnesium 3.2 Phosphorus 5.7 Ammonia 39    I have reviewed the images obtained: CT head without contrast 08/24/2019: No acute intracranial abnormality   Assessment and plan:  53 year old complicated male with new diagnosis of hepatic cirrhosis, AKI currently on CRRT.  Neurology was consulted due to encephalopathy and concern for seizure.   Encephalopathy Hyperammonemia AKI Liver cirrhosis Thrombocytopenia Anemia Acute respiratory failure Peripheral artery disease -Routine EEG showed generalized epileptiform discharges with triphasic morphology at 1.5 to 2 Hz.  These discharge lie along the ictal interictal continuum.  Patient was given IV Ativan 2 mg after which the frequency improved to 1 to 1.5 Hz.  However patient continues to be altered and does not follow commands.  Therefore is most likely that these discharges are secondary to toxic metabolic disturbances including hyperammonemia, elevated BUN. -   Recommendations -We will monitor on EEG for 24 hours to assess for any subclinical seizures and change in EEG pattern -EEG continued to show triphasic waves at 1 to 1.5 Hz without any clear lesion, then most likely these are not ictal and we will not start any antiepileptic medications. -However if triphasic waves appear to increase in frequency or clear seizures are seen, we will start Keppra ( renal dosing) -Continue seizure precautions -As needed IV Ativan only for generalized tonic-clonic seizure lasting more than 2 minutes or focal seizure lasting more than 5 minutes.   CRITICAL CARE Performed by: Lora Havens   Total critical care time: 35 minutes  Critical care time was exclusive of separately billable procedures and treating other patients.  Critical care was necessary to treat or prevent imminent or life-threatening deterioration.  Critical care was time spent personally by me on the following activities: development of treatment plan with patient and/or surrogate as well as nursing, discussions with consultants, evaluation of patient's response to treatment, examination of patient, obtaining history from patient or surrogate, ordering and  performing treatments and interventions, ordering and review of laboratory studies, ordering and review of radiographic studies, pulse oximetry and re-evaluation of patient's condition.

## 2019-08-25 NOTE — Progress Notes (Signed)
vLTM EEG started following spot EEG.

## 2019-08-25 NOTE — Progress Notes (Signed)
Nutrition Follow-up  DOCUMENTATION CODES:   Not applicable  INTERVENTION:   D/C liquid MVI, add B complex with Vitamin C  Tube feeding:  -Vital AF 1.2 @ 20 ml/hr via Cortrak  -Increase by 10 ml Q4 hours to goal rate of 60 ml/hr (1440 ml) -30 ml Prostat BID  At goal TF provides: 1928 kcals, 138 grams protein, 1168 ml free water. Meets 103% of kcal needs and 100% protein needs.   NUTRITION DIAGNOSIS:   Increased nutrient needs related to acute illness(AKI requiring CRRT) as evidenced by estimated needs.  Ongoing  GOAL:   Patient will meet greater than or equal to 90% of their needs   Addressed via TF  MONITOR:   Diet advancement, Supplement acceptance, PO intake, Labs, Weight trends, I & O's, Skin  REASON FOR ASSESSMENT:   Rounds    ASSESSMENT:   Patient with PMH significant for HTN, HLD, DM, and PAD s/p bilateral common iliac stenting on 4/9 . Presents this admission with continued leg leg pain.   10/12- s/p L femoral PTA bypass 10/18- R IJ- start CRRT 10/21- stop CRRT 10/23- gastric Cortrak placed, re-intubated  10/24- poor toleration iHD, placed back on CRRT  Pt discussed during ICU rounds and with RN.   Mental status remains poor. Off sedation. CRRT to be stopped today once filter runs out.   Had EGD s/p Cortrak placement, results unremarkable. Cortrak pulled prior to procedure. ENT found pt had nasopharyngeal bleeding likely from multiple NGT placement attempts, balloon pack placed . No NGT for at least a week. OGT placed by IR. Will titrate feeding to goal.   Admission weight: 98.4 kg  Current weight: 88.2  kg   Patient remains intubated on ventilator support MV: 11.8 L/min Temp (24hrs), Avg:96.3 F (35.7 C), Min:95.4 F (35.2 C), Max:97.9 F (36.6 C)    I/O: -3,686 ml since admit UOP: 42 ml x 24 hrs CRRT: 1,871 ml x 24 hrs Rectal tube: 285 ml x 24 hrs    Medications: Vit 123456, aranesp, folic acid, SS novolog, lantus, lactulose liquid MVI,  thiamine Labs: Cr 3.96- trending down Phosphorus 5.3 (H) corrected calcium 9.9 (wdl) LFTs elevated CBG 128-172  Diet Order:   Diet Order            Diet NPO time specified  Diet effective now              EDUCATION NEEDS:   Not appropriate for education at this time  Skin:  Skin Assessment: Skin Integrity Issues: Skin Integrity Issues:: Incisions Incisions: L leg  Last BM:  10/26  Height:   Ht Readings from Last 1 Encounters:  08/11/19 6\' 1"  (1.854 m)    Weight:   Wt Readings from Last 1 Encounters:  08/25/19 88.2 kg    Ideal Body Weight:  83.6 kg  BMI:  Body mass index is 25.65 kg/m.  Estimated Nutritional Needs:   Kcal:  1866 kcal  Protein:  130-145 grams  Fluid:  >/= 1.8 L/day  Mariana Single RD, LDN Clinical Nutrition Pager # - [phone]

## 2019-08-25 NOTE — Procedures (Signed)
I evaluated the patient while on CRRT and discussed with bedside RN.  Dialysis is running without issue. 

## 2019-08-25 NOTE — Progress Notes (Signed)
Return Pressure Extremely Positive Alarm began and unable to be cleared. Return line assessed and noted without kinks. HD catheter not previously noted to be positional. CRRT paused and return line clamped and disconnected. NS flush connected to HD catheter blue port, where return line was connected, after being cleansed with CHG. Initially return line drew back before attempting to flush. Line drew back without difficulty, however, vibrations noted when drawing back resembling clots being sucked out of venous return line. CRRT reconnected and restarted after blood/clot withdrawal.  Patient without systemic anticoagulation or anticoagulation through CRRT circuit. Blood flow at 150 prior to return pressure alarm. Blood flow gradually increased to 250 to attempt to prevent further clotting. Day shift team to be made aware.

## 2019-08-25 NOTE — Progress Notes (Signed)
Subjective:  Patient remains on ventilator. Off sedation. Does not follow commands. Tolerating CRRT.   Objective Vital signs in last 24 hours: Vitals:   08/25/19 0700 08/25/19 0800 08/25/19 0804 08/25/19 0900  BP: 102/62 129/69  132/67  Pulse: 71 74  77  Resp: _0 Temp:   (!) 95.4 F (35.2 C)   TempSrc:   Axillary   SpO2: 98% 100%  100%  Weight:      Height:       Weight change: 0 kg  Intake/Output Summary (Last 24 hours) at 08/25/2019 1046 Last data filed at 08/25/2019 1000 Gross per 24 hour  Intake 2361.75 ml  Output 2452 ml  Net -90.25 ml    Assessment/ Plan: Pt is a53 y.o.yo malewho was admitted on 10/12/2020for left femoral to PTA bypass graft placement and subsequently developed oliguric renal failure requiring CRRT. Assessment/Plan: 1.AKI (baseline crt ~1) - multifactorial in the setting of ischemic ATN with ABLA and relative hypotension, contrast nephropathy and BOO. Ultimately required initiation of CRRT on 10/18(temp cath placed)due to profound volume overloadafter failing high doses of Lasix and metolazone.Labs and volume statusgreatly improved as aresult of CRRT which was discontinued 10/21 at 15:30.  Intermittent HD treatments on 08/23/2019 for hyperkalemia.  Did not tolerate well due to hypotension. Treatment was ended early.  Placed back on CRRT for persistent hyperkalemia. Today, potassium is stable, he is euvolemic, and he has remained normotensive. Think it is reasonable to try off of CRRT once filter runs out today to see how he does. He may have had some decompensation in the setting of acute nasopharyngeal bleed which has now resolved.  2. Acute hypoxic respiratory failure - volume overload vs multifocal pneumonia.  Reintubated on Friday for airway protection in the setting of nasopharyngeal bleed. Oxygenation stable. Mental status primary barrier to extubation. 3. ABLA - transfuse as needed. ContinueESA.  4. Hyponatremia due to hypervolemia  -resolved 5. HTN - normotensive. Continue Labetalol as needed for hypertension.  6. Thrombocytopenia - chronic and worked up by hematology without etiology. HIT panel negative.  7. DM type 2 - poorly controlled. Holding Invokana due to AKI. SSI as needed  8. Elytes - hyperkalemia resolved on CRRT;corrected calcium ok; Phos improved. 9. Metabolic acidosis - due to acute renal failure;resolved with CRR Jazzlin Clements D Jakiah Bienaime    Labs: Basic Metabolic Panel: Recent Labs  Lab 08/24/19 1657 08/25/19 0551 08/25/19 0807  NA 140 137 136  K 4.6 3.9 3.8  CL 106 102 102  CO2 _1 GLUCOSE 202* 135* 159*  BUN 112* 73* 65*  CREATININE 5.70* 4.32* 3.96*  CALCIUM 8.0* 8.1* 8.1*  PHOS 7.4* 5.7* 5.3*   Liver Function Tests: Recent Labs  Lab 08/24/19 0758 08/24/19 1657 08/25/19 0551 08/25/19 0807  AST 454*  --  554* 554*  ALT 111*  --  155* 155*  ALKPHOS 96  --  105 101  BILITOT 4.1*  --  4.0* 4.2*  PROT 6.2*  --  8.1 7.7  ALBUMIN 1.6* 1.7* 1.9* 1.8*   No results for input(s): LIPASE, AMYLASE in the last 168 hours. Recent Labs  Lab 08/21/19 0646 08/22/19 0431 08/25/19 0807  AMMONIA 87* 60* 59*   CBC: Recent Labs  Lab 08/22/19 1254  08/23/19 0539 08/23/19 1325 08/24/19 0758 08/25/19 0551 08/25/19 0807  WBC 28.0*   < > 34.3* 36.2* 36.7* 25.5* 23.8*  NEUTROABS 21.7*  --   --   --  28.3* 19.3*  --  HGB 7.9*   < > 7.0* 6.9* 7.7* 8.1* 8.2*  HCT 25.8*   < > 23.2* 22.5* 24.3* 26.1* 25.8*  MCV 89.0   < > 92.1 92.2 89.7 90.6 89.3  PLT 79*   < > 84* 86* 61* 50* 45*   < > = values in this interval not displayed.   Cardiac Enzymes: No results for input(s): CKTOTAL, CKMB, CKMBINDEX, TROPONINI in the last 168 hours. CBG: Recent Labs  Lab 08/24/19 1503 08/24/19 1953 08/24/19 2334 08/25/19 0341 08/25/19 0801  GLUCAP 191* 172* 149* 140* 157*    Iron Studies: No results for input(s): IRON, TIBC, TRANSFERRIN, FERRITIN in the last 72 hours. Studies/Results: Ct Head Wo  Contrast  Result Date: 08/24/2019 CLINICAL DATA:  Encephalopathy.  Seizure like activity EXAM: CT HEAD WITHOUT CONTRAST TECHNIQUE: Contiguous axial images were obtained from the base of the skull through the vertex without intravenous contrast. COMPARISON:  None. FINDINGS: Brain: No evidence of acute infarction, hemorrhage, hydrocephalus, extra-axial collection or mass lesion/mass effect. Mild atrophy and mild white matter changes most likely chronic microvascular ischemia. Vascular: Negative for hyperdense vessel Skull: Negative Sinuses/Orbits: Patient is intubated. Mild mucosal edema paranasal sinuses. Negative orbit Other: None IMPRESSION: No acute intracranial abnormality. Electronically Signed   By: Franchot Gallo M.D.   On: 08/24/2019 11:55   Dg Chest Port 1 View  Result Date: 08/24/2019 CLINICAL DATA:  Endotracheal tube positioning EXAM: PORTABLE CHEST 1 VIEW COMPARISON:  Chest radiograph dated 08/23/2019 FINDINGS: An endotracheal tube terminates in the midthoracic trachea. A right internal jugular vascular catheter tip overlies the superior vena cava. The cardiac and mediastinal silhouettes are unchanged. The lungs are clear. Vascular calcifications are seen in the aortic arch. IMPRESSION: Clear lungs.  Unchanged positioning of an endotracheal due. Aortic Atherosclerosis (ICD10-I70.0). Electronically Signed   By: Zerita Boers M.D.   On: 08/24/2019 15:40   Dg Addison Bailey G Tube Plc W/fl W/rad  Result Date: 08/24/2019 CLINICAL DATA:  OG tube placement requested on intubated patient. EXAM: NASO G TUBE PLACEMENT WITH FL AND WITH RAD CONTRAST:  10 cc Omnipaque iodinated contrast administered via OG-tube. FLUOROSCOPY TIME:  Fluoroscopy Time:  4 minutes 24 seconds Radiation Exposure Index (if provided by the fluoroscopic device): 64.5 mGy Number of Acquired Spot Images: 0 COMPARISON:  08/22/2019 abdominal radiograph FINDINGS: A 12 Fr OG tube was successfully advanced into the distal stomach under  fluoroscopic guidance with the tip verified in the region of the pylorus. IMPRESSION: Successful OG tube placement into the distal stomach under fluoroscopic guidance. Tip verified in the region of the pylorus. Tube ready for use. Electronically Signed   By: Ilona Sorrel M.D.   On: 08/24/2019 16:03   Medications: Infusions: .  prismasol BGK 4/2.5 200 mL/hr at 08/24/19 1401  . sodium chloride Stopped (08/22/19 1311)  . sodium chloride 35 mL (08/22/19 2239)  . dexmedetomidine (PRECEDEX) IV infusion Stopped (08/25/19 0511)  . iron dextran (INFED/DEXFERRUM) infusion 500 mg (08/25/19 1006)  . magnesium sulfate bolus IVPB    . prismasol BGK 2/2.5 dialysis solution 2,000 mL/hr at 08/25/19 0942  . prismasol BGK 2/2.5 replacement solution 200 mL/hr at 08/24/19 1404    Scheduled Medications: . sodium chloride   Intravenous Once  . sodium chloride   Intravenous Once  . chlorhexidine gluconate (MEDLINE KIT)  15 mL Mouth Rinse BID  . Chlorhexidine Gluconate Cloth  6 each Topical Daily  . cyanocobalamin  1,000 mcg Intramuscular Daily  . darbepoetin (ARANESP) injection - NON-DIALYSIS  150 mcg  Subcutaneous Q Mon-1800  . folic acid  1 mg Intravenous Daily  . insulin aspart  0-9 Units Subcutaneous Q4H  . insulin glargine  5 Units Subcutaneous QPM  . lactulose  30 g Per Tube TID  . mouth rinse  15 mL Mouth Rinse 10 times per day  . multivitamin  15 mL Per Tube Daily  . pantoprazole (PROTONIX) IV  40 mg Intravenous Q24H  . sodium chloride flush  10-40 mL Intracatheter Q12H  . sodium chloride flush  3 mL Intravenous Q12H  . sodium polystyrene  30 g Rectal Once  . thiamine  100 mg Oral Daily   Or  . thiamine  100 mg Intravenous Daily  . [START ON 08/31/2019] vitamin B-12  1,000 mcg Oral Daily    have reviewed scheduled and prn medications.  Physical Exam: General: intubated, NAD Heart: RRR Lungs: CTAB Abdomen: BS+; soft, non-tender, non-distended  Extremities: no pitting edema    08/25/2019,10:46 AM  LOS: 14 days

## 2019-08-25 NOTE — Progress Notes (Signed)
SLP Cancellation Note  Patient Details Name: Edward Rocha. MRN: [id number] DOB: 1966-03-27   Cancelled treatment:       Reason Eval/Treat Not Completed: Medical issues which prohibited therapy - now intubated. Will f/u for ability to resume SLP.    [name] Maico Mulvehill 08/25/2019, 8:13 AM  Pollyann Glen, M.A. [place] [facility] Environmental education officer [phone] Office [phone]

## 2019-08-25 NOTE — Progress Notes (Signed)
NAME:  Edward Rocha., MRN:  [id number], DOB:  1966-09-03, LOS: 17 ADMISSION DATE:  08/11/2019, CONSULTATION DATE:  08/17/19, CHIEF COMPLAINT:  SOB  Brief History    53 yo with history of DM, HTN, HLD, thrombocytopenia, and PAD s/p aortogram with left iliac arteriogram prior to left femoral to PTA bypass graft placement on 08/11/19.  Developed oliguric renal failure, CCM consulted 10/18 due to worsening hypoxia (  volume overload vs pneumonia) Pt was treated with CVVHD x 2 days 10/19-10/21. PCCM had signed off on 10/21. Patient has a new diagnosis during this admission of cirrhosis secondary to EtOH abuse  10/23 PCCM were re consulted for emergent intubation in setting of vomiting BRB emesis of 600 cc's decreased mental status and concern for airway protection. Pt was emergently intubated  Past Medical History  DM HTN HLD Thrombocytopenia PAD AKI   Significant Hospital Events   08/11/19-admit for vascular procedure.  CCM on board from 10/18-10/21 for respiratory failure, renal failure requiring hemodialysis  10/23 - Re-consulted for emergent intubation in setting of bloody emesis and inability to protect airway in setting of massive GI v ENT hemorrhag v both GI scopy - s/p banding of 2 non-bleeding varices. Bleeding not thought to be from esophagus or stomatch ENT cx > underwent nasal packing. After this maneuver there was no further bleeding identified Wife - [name] witness - does not want PRBC unless we have to  10/25-nasal balloon removed by ENT with no evidence of ongoing bleed.  OG tube placed by IR  Consults:  PCCM Nephro Surgery  Procedures:  [id number] HD cath 10/18 >> 10/23 - ETT 10/25 - flexiseal following lactulse enema  Significant Diagnostic Tests:  10/14  renal ultrasound >> no hnosis  10/25 CT head-no acute intracranial abnormality 1/26 EEG-pending  Micro Data:  Covid negative. 10/18 BC >>ng 10/23 Blood 10/23 Sputum  Antimicrobials:  Cefepime 10/17 >>  10/23 Vanc 10/23 >> 10/24  Interim history/subjective:  No acute events.  Mental status continues to be poor Continues on vent, CVVH  Objective   Blood pressure 132/67, pulse 77, temperature (!) 95.4 F (35.2 C), temperature source Axillary, resp. rate 16, height 6\' 1"  (1.854 m), weight 88.2 kg, SpO2 100 %. CVP:  [1 mmHg] 1 mmHg  Vent Mode: PRVC FiO2 (%):  [40 %] 40 % Set Rate:  [18 bmp] 18 bmp Vt Set:  [630 mL] 630 mL PEEP:  [5 cmH20] 5 cmH20 Pressure Support:  [5 cmH20] 5 cmH20 Plateau Pressure:  [13 cmH20-21 cmH20] 21 cmH20   Intake/Output Summary (Last 24 hours) at 08/25/2019 1018 Last data filed at 08/25/2019 1000 Gross per 24 hour  Intake 2361.75 ml  Output 2452 ml  Net -90.25 ml   Filed Weights   08/23/19 1800 08/23/19 2036 08/25/19 0500  Weight: 88.2 kg 88.7 kg 88.2 kg    Gen:      No acute distress HEENT:  EOMI, sclera anicteric, ETT Neck:     No masses; no thyromegaly Lungs:    Clear to auscultation bilaterally; normal respiratory effort CV:         Regular rate and rhythm; no murmurs Abd:      + bowel sounds; soft, non-tender; no palpable masses, no distension Ext:    No edema; adequate peripheral perfusion Skin:      Warm and dry; no rash Neuro: [facility] Problem list   NA  Assessment & Plan:   Acute Emergent  intubation for airway protection  Due to bleed from NP/OP area - stopped with ballloon tamponade 08/22/2019 Plan Start pressure support weans as tolerated Follow intermittent chest x-[place]  Massive hemorrhagge - initial 10/23  - unclear hematemsis Vs hemoptysis - ended up as OP tear bleed.  S/p non-bleeding UGI varices banding.  S/p bilateral nasal pacing and Rt nostril balloon tamponade that stopped the bleeding HTN -  X 2 days prior to bleedin prior to 08/22/2019 Plan Lobetalol 5 mg prn for SBP > 150 DC octerotide  AKI favor ischemic ATN due to hypotension on dialysis Plan CRRT per nephrologu DC bicarb drip  Acute  encephalopathy (Per wife pt  is a heavy drinker ( drinks two 40's per day) Plan Limit sedation, continue lactulose CT head is unremarkable Waiting for EEG.  Thrombocytopenia-likely secondary to liver disease Anemia of critical illness, blood loss Plan Monitor CBC (wife being [name] wants conservative approach) Transfuse as needed. Iron, B12, Aranesp  Cirrhosis  Of liver  - likely etoh -with varices Plan Continue lactulose, thiamine, folate   Best practice:  Diet: Start tube feeds Pain/Anxiety/Delirium protocol (if indicated): Limit sedation VAP protocol (if indicated):Initiated DVT prophylaxis: SCD (? Heparin for CRRT when restarted) GI prophylaxis: Protonix Mobility: As toelrated Code Status: Full for now Family Communication:  Wife gets updates from [place] (she was updated by MD 08/23/2019) . She is asking for conservatio of blood and Rx anemia with medications such as iron, b12, aranesp . Be judicious with PRBC  Disposition: ICU    ATTESTATION & SIGNATURE   The patient is critically ill with multiple organ system failure and requires high complexity decision making for assessment and support, frequent evaluation and titration of therapies, advanced monitoring, review of radiographic studies and interpretation of complex data.   Critical Care Time devoted to patient care services, exclusive of separately billable procedures, described in this note is 35 minutes.   Marshell Garfinkel MD [facility] Pager [phone] If no answer call [phone] 08/25/2019, 10:18 AM

## 2019-08-25 NOTE — Progress Notes (Signed)
EEG completed, results pending. 

## 2019-08-25 NOTE — Progress Notes (Signed)
[facility]  Subjective  -  Tolerated CRRT yesterday.  Had OG tube placed by radiology after foley removed from nasopharynx.  CT head negative for any acute intracranial abnormalities given not following commands.    Objective 132/67 77 (!) 95.4 F (35.2 C) (Axillary) 16 100%  Intake/Output Summary (Last 24 hours) at 08/25/2019 1032 Last data filed at 08/25/2019 1000 Gross per 24 hour  Intake 2361.75 ml  Output 2452 ml  Net -90.25 ml   Intubated, not following commands, moves extremities OG tube in place Left PT brisk signal Left groin incision c/d/i Left leg incisions c/d/i - some serous drainage Abdomen soft  Laboratory Lab Results: Recent Labs    08/25/19 0551 08/25/19 0807  WBC 25.5* 23.8*  HGB 8.1* 8.2*  HCT 26.1* 25.8*  PLT 50* 45*   BMET Recent Labs    08/25/19 0551 08/25/19 0807  NA 137 136  K 3.9 3.8  CL 102 102  CO2 23 24  GLUCOSE 135* 159*  BUN 73* 65*  CREATININE 4.32* 3.96*  CALCIUM 8.1* 8.1*    COAG Lab Results  Component Value Date   INR 1.4 (H) 08/22/2019   INR 1.5 (H) 08/22/2019   INR 1.2 08/07/2019   No results found for: PTT  Assessment/Planning:  Postop day 14 status post left common femoral to PT bypass with composite PTFE and GSV. Continues to have a very brisk posterior tibial signal in the left foot.    Neuro: Now intubated for airway protection after bleeding from his nasopharynx Friday.  Not following commands but moving all extremities.  CT head negative for any acute abnormality yesterday.  EEG to r/o seizure. Significant mental issues postop.  Initially raised the question of withdrawal.  He is on CIWA protocol now.  Also probably has a component of hepatic encephalopathy given evidence of cirrhosis.  Continue lactulose.  Re-check ammonia level. [id number] post-op.  Hgb 8.2 today, got 1 upRBC over weekend,  Platelets 45 - not bleeding - no immediate indication for transfusion.. Pulm:  Intubated.  Minimal vent settings.  Mental status limiting extubation.  Wean to extubate. GI: OG tube placed yesterday by radiology.  Had nasopharynx bleed on Friday.  Right naris packed with Foley catheter for balloon tamponade - removed yesterday.Marland Kitchen  Appreciate ENT and GI seeing him.  We have stopped his aspirin Plavix.  Start tube feeds?? Renal: AKI post-op progressed to oliguric renal failure.  Tried aggressive diuresis with high dose lasix and no improvement with worsening respiratory status.  CRRT and pulled off 10 L.  Tolerated CRRT yesterday. Has chronic thrombocytopenia that was worked up by hematology prior to surgery and previously had trial of steroids with no improvement. .  I now suspect he has a component of cirrhosis given varices on EGD yesterday with chronically low platelets elevated liver enzymes and nodular findings on previous liver ultrasound.  Wife now states heavy ETOH abuse prior to surgery. ID: WBC up to 36 over weekend, down to 23 today.  Afebrile. Broadened antibiotics Friday.  Now on Vanc and cefepime.  Cultures not growing anything.  Afebrile FEN: Remain saline locked.  Appreciate nephrology input.  Remains critically ill in the ICU.  Updated his wife by phone yesterday.  Discussed that his prognosis is poor.  His bypass was fairly uneventful but he has done very poorly postop.  Initially had renal failure with volume overload requiring CRRT.  Now he has had nasopharyngeal bleed requiring intubation for airway protection.  Chronically low platelets and elevated liver enzymes raises the question of cirrhosis and certainly could be a factor in his renal failure after surgery.  Questions hepatorenal syndrome with worsening liver enzymes now and hepatic encephalopathy.   [name] 08/25/2019 10:32 AM --

## 2019-08-26 DIAGNOSIS — R4182 Altered mental status, unspecified: Secondary | ICD-10-CM

## 2019-08-26 DIAGNOSIS — J9600 Acute respiratory failure, unspecified whether with hypoxia or hypercapnia: Secondary | ICD-10-CM | POA: Diagnosis not present

## 2019-08-26 LAB — CBC WITH DIFFERENTIAL/PLATELET
Abs Immature Granulocytes: 0.54 10*3/uL — ABNORMAL HIGH (ref 0.00–0.07)
Basophils Absolute: 0.1 10*3/uL (ref 0.0–0.1)
Basophils Relative: 0 %
Eosinophils Absolute: 0.4 10*3/uL (ref 0.0–0.5)
Eosinophils Relative: 2 %
HCT: 24.5 % — ABNORMAL LOW (ref 39.0–52.0)
Hemoglobin: 7.8 g/dL — ABNORMAL LOW (ref 13.0–17.0)
Immature Granulocytes: 3 %
Lymphocytes Relative: 19 %
Lymphs Abs: 3.5 10*3/uL (ref 0.7–4.0)
MCH: 28.9 pg (ref 26.0–34.0)
MCHC: 31.8 g/dL (ref 30.0–36.0)
MCV: 90.7 fL (ref 80.0–100.0)
Monocytes Absolute: 2.2 10*3/uL — ABNORMAL HIGH (ref 0.1–1.0)
Monocytes Relative: 12 %
Neutro Abs: 12.2 10*3/uL — ABNORMAL HIGH (ref 1.7–7.7)
Neutrophils Relative %: 64 %
Platelets: 45 10*3/uL — ABNORMAL LOW (ref 150–400)
RBC: 2.7 MIL/uL — ABNORMAL LOW (ref 4.22–5.81)
RDW: 18.7 % — ABNORMAL HIGH (ref 11.5–15.5)
WBC: 19 10*3/uL — ABNORMAL HIGH (ref 4.0–10.5)
nRBC: 1.8 % — ABNORMAL HIGH (ref 0.0–0.2)

## 2019-08-26 LAB — GLUCOSE, CAPILLARY
Glucose-Capillary: 117 mg/dL — ABNORMAL HIGH (ref 70–99)
Glucose-Capillary: 196 mg/dL — ABNORMAL HIGH (ref 70–99)
Glucose-Capillary: 227 mg/dL — ABNORMAL HIGH (ref 70–99)
Glucose-Capillary: 248 mg/dL — ABNORMAL HIGH (ref 70–99)
Glucose-Capillary: 219 mg/dL — ABNORMAL HIGH (ref 70–99)

## 2019-08-26 LAB — MAGNESIUM: Magnesium: 2.8 mg/dL — ABNORMAL HIGH (ref 1.7–2.4)

## 2019-08-26 LAB — COMPREHENSIVE METABOLIC PANEL
ALT: 124 U/L — ABNORMAL HIGH (ref 0–44)
AST: 411 U/L — ABNORMAL HIGH (ref 15–41)
Albumin: 1.6 g/dL — ABNORMAL LOW (ref 3.5–5.0)
Alkaline Phosphatase: 84 U/L (ref 38–126)
Anion gap: 10 (ref 5–15)
BUN: 66 mg/dL — ABNORMAL HIGH (ref 6–20)
CO2: 22 mmol/L (ref 22–32)
Calcium: 7.5 mg/dL — ABNORMAL LOW (ref 8.9–10.3)
Chloride: 104 mmol/L (ref 98–111)
Creatinine, Ser: 4.8 mg/dL — ABNORMAL HIGH (ref 0.61–1.24)
GFR calc Af Amer: 15 mL/min — ABNORMAL LOW (ref >60)
GFR calc non Af Amer: 13 mL/min — ABNORMAL LOW (ref >60)
Glucose, Bld: 141 mg/dL — ABNORMAL HIGH (ref 70–99)
Potassium: 3.5 mmol/L (ref 3.5–5.1)
Sodium: 136 mmol/L (ref 135–145)
Total Bilirubin: 2.8 mg/dL — ABNORMAL HIGH (ref 0.3–1.2)
Total Protein: 6.9 g/dL (ref 6.5–8.1)

## 2019-08-26 LAB — PHOSPHORUS: Phosphorus: 4.9 mg/dL — ABNORMAL HIGH (ref 2.5–4.6)

## 2019-08-26 LAB — AMMONIA: Ammonia: 55 umol/L — ABNORMAL HIGH (ref 9–35)

## 2019-08-26 MED ORDER — POTASSIUM CHLORIDE 20 MEQ/15ML (10%) PO SOLN
10.0000 meq | Freq: Once | ORAL | Status: AC
  Administered 2019-08-26: 10 meq via ORAL
  Filled 2019-08-26: qty 15

## 2019-08-26 MED ORDER — POTASSIUM CHLORIDE CRYS ER 10 MEQ PO TBCR
10.0000 meq | EXTENDED_RELEASE_TABLET | Freq: Once | ORAL | Status: DC
  Filled 2019-08-26: qty 1

## 2019-08-26 NOTE — Progress Notes (Signed)
LTM EEG d/c per neuro. No skin breakdown

## 2019-08-26 NOTE — Progress Notes (Signed)
[facility]  Subjective  -tolerated CRRT.  Tolerating tube feeds.  Nursing reports he had some intermittently following commands overnight.    Objective (!) 126/59 82 98.9 F (37.2 C) (Axillary) 16 100%  Intake/Output Summary (Last 24 hours) at 08/26/2019 0933 Last data filed at 08/26/2019 0900 Gross per 24 hour  Intake 1517.73 ml  Output 1226 ml  Net 291.73 ml   Intubated OG tube in place Left PT/Dp signal Left groin incision c/d/i Left leg incisions c/d/i - some serous drainage Abdomen soft  Laboratory Lab Results: Recent Labs    08/25/19 0807 08/26/19 0500  WBC 23.8* 19.0*  HGB 8.2* 7.8*  HCT 25.8* 24.5*  PLT 45* 45*   BMET Recent Labs    08/25/19 1606 08/26/19 0500  NA 139 136  K 3.6 3.5  CL 106 104  CO2 23 22  GLUCOSE 131* 141*  BUN 51* 66*  CREATININE 3.37* 4.80*  CALCIUM 7.7* 7.5*    COAG Lab Results  Component Value Date   INR 1.4 (H) 08/22/2019   INR 1.5 (H) 08/22/2019   INR 1.2 08/07/2019   No results found for: PTT  Assessment/Planning:  Postop day 15 status post left common femoral to PT bypass with composite PTFE and GSV.   Neuro: Now intubated for airway protection after bleeding from his nasopharynx Friday.  Not following commands but moving all extremities.  CT head negative for any acute abnormality yesterday.  EEG to r/o seizure.  Appreciate neurology input. Significant mental issues postop.  Initially raised the question of withdrawal.  He is on CIWA protocol now.  Also probably has a component of hepatic encephalopathy given evidence of cirrhosis.  Continue lactulose.  Ammonia slowly improving. [id number] post-op.  Hgb 7.8, no indication for transfusion.  Platelets 45 - not bleeding - no immediate indication for transfusion.. Pulm: Intubated.  Minimal vent settings.  Mental status limiting extubation.  Wean to extubate. GI: OG tube placed by radiology.  Had nasopharynx bleed on Friday.  Right  naris packed with Foley catheter for balloon tamponade - removed.  Appreciate ENT and GI seeing him.  We have stopped his aspirin Plavix.  Tolerating tube feeds. Renal: AKI post-op progressed to oliguric renal failure.  Tried aggressive diuresis with high dose lasix and no improvement with worsening respiratory status.  CRRT and pulled off 10 L.  Tolerated CRRT yesterday. Has chronic thrombocytopenia that was worked up by hematology prior to surgery and previously had trial of steroids with no improvement. .  I now suspect he has a component of cirrhosis given varices on EGD yesterday with chronically low platelets elevated liver enzymes and nodular findings on previous liver ultrasound.  Wife now states heavy ETOH abuse prior to surgery. ID: White blood cells improving.  On Vanc cefepime.  Cultures no growth to date. FEN: Remain saline locked.  Appreciate nephrology input.  Remains critically ill in the ICU.    Tolerating CRRT and labs improving.  Ammonia is improving with lactulose.  Appreciate nephrology/neurology/CCM input.  If his mental status improves hopefully can get him extubated.   [name] 08/26/2019 9:33 AM --

## 2019-08-26 NOTE — Progress Notes (Signed)
Subjective: Overnight, patient would intermittently follow commands, but kept trying to remove ETT tube requiring soft restrains and increased Precedex drip. This morning he is still sedated and does not respond to verbal or noxious stimuli. Has not had any urine output.   Objective Vital signs in last 24 hours: Vitals:   08/26/19 0600 08/26/19 0630 08/26/19 0700 08/26/19 0830  BP: (!) 142/64 139/60 (!) 143/62 (!) 126/59  Pulse: 69 67 67 82  Resp: _0 Temp:   98.9 F (37.2 C)   TempSrc:   Axillary   SpO2: 100% 100% 100% 100%  Weight:      Height:       Weight change: -3.8 kg  Intake/Output Summary (Last 24 hours) at 08/26/2019 0920 Last data filed at 08/26/2019 0900 Gross per 24 hour  Intake 1517.73 ml  Output 1226 ml  Net 291.73 ml    Assessment/ Plan: Pt is a53 y.o.yo malewho was admitted on 10/12/2020for left femoral to PTA bypass graft placement and subsequently developed oliguric renal failure requiring CRRT.  Assessment/Plan: 1.AKI (baseline crt ~1) - multifactorial in the setting of ischemic ATN with ABLA and relative hypotension, contrast nephropathy and BOO. Ultimately required initiation of CRRT on 10/18(temp cath placed)due to profound volume overloadafter failing high doses of Lasix and metolazone.Labs and volume statusgreatly improved as aresult of CRRT which was discontinued 10/21 at 15:30.Intermittent HD treatments on 08/23/2019 for hyperkalemia. Did not tolerate well due to hypotension. Treatment was ended early. Placed back on CRRT for persistent hyperkalemia which was discontinued again on 10/26. Since discontinuing CRRT, his potassium remains stable. BUN, crt are worsening. Remains anuric. No evidence of overt volume overload on exam. He is stable enough to monitor off of CRRT again today, but will likely need to resume intermittent HD. Appears to have enough blood pressure to tolerate if renal function does not improve.   2. Acute hypoxic  respiratory failure - volume overload vs multifocal pneumonia. Reintubated on Fridayfor airway protection in the setting of nasopharyngeal bleed. Oxygenation stable. Mental status primary barrier to extubation  3. ABLA - hgb stable. transfuse as needed. ContinueESA  4. Hyponatremia due to hypervolemia -resolved  5. HTN - normotensive. Continue Labetalol as needed for hypertension.   6. Thrombocytopenia - chronic and worked up by hematology without etiology. HIT panel negative.   7. DM type 2 - poorly controlled. Holding Invokana due to AKI. SSI as needed   8. Elytes - K stable;corrected calcium ok;phos ok  9. Metabolic acidosis - due to acute renal failure;resolved with CRRT  Delice Bison    Labs: Basic Metabolic Panel: Recent Labs  Lab 08/25/19 0807 08/25/19 1606 08/26/19 0500  NA 136 139 136  K 3.8 3.6 3.5  CL 102 106 104  CO2 _1 GLUCOSE 159* 131* 141*  BUN 65* 51* 66*  CREATININE 3.96* 3.37* 4.80*  CALCIUM 8.1* 7.7* 7.5*  PHOS 5.3* 5.4* 4.9*   Liver Function Tests: Recent Labs  Lab 08/25/19 0551 08/25/19 0807 08/25/19 1606 08/26/19 0500  AST 554* 554*  --  411*  ALT 155* 155*  --  124*  ALKPHOS 105 101  --  84  BILITOT 4.0* 4.2*  --  2.8*  PROT 8.1 7.7  --  6.9  ALBUMIN 1.9* 1.8* 1.7* 1.6*   No results for input(s): LIPASE, AMYLASE in the last 168 hours. Recent Labs  Lab 08/22/19 0431 08/25/19 0807 08/26/19 0816  AMMONIA 60* 59* 55*   CBC: Recent  Labs  Lab 08/23/19 1325 08/24/19 0758 08/25/19 0551 08/25/19 0807 08/26/19 0500  WBC 36.2* 36.7* 25.5* 23.8* 19.0*  NEUTROABS  --  28.3* 19.3*  --  12.2*  HGB 6.9* 7.7* 8.1* 8.2* 7.8*  HCT 22.5* 24.3* 26.1* 25.8* 24.5*  MCV 92.2 89.7 90.6 89.3 90.7  PLT 86* 61* 50* 45* 45*   Cardiac Enzymes: No results for input(s): CKTOTAL, CKMB, CKMBINDEX, TROPONINI in the last 168 hours. CBG: Recent Labs  Lab 08/25/19 1548 08/25/19 1951 08/25/19 2345 08/26/19 0339 08/26/19 0753   GLUCAP 137* 156* 145* 117* 196*    Iron Studies: No results for input(s): IRON, TIBC, TRANSFERRIN, FERRITIN in the last 72 hours. Studies/Results: Ct Head Wo Contrast  Result Date: 08/24/2019 CLINICAL DATA:  Encephalopathy.  Seizure like activity EXAM: CT HEAD WITHOUT CONTRAST TECHNIQUE: Contiguous axial images were obtained from the base of the skull through the vertex without intravenous contrast. COMPARISON:  None. FINDINGS: Brain: No evidence of acute infarction, hemorrhage, hydrocephalus, extra-axial collection or mass lesion/mass effect. Mild atrophy and mild white matter changes most likely chronic microvascular ischemia. Vascular: Negative for hyperdense vessel Skull: Negative Sinuses/Orbits: Patient is intubated. Mild mucosal edema paranasal sinuses. Negative orbit Other: None IMPRESSION: No acute intracranial abnormality. Electronically Signed   By: Franchot Gallo M.D.   On: 08/24/2019 11:55   Dg Addison Bailey G Tube Plc W/fl W/rad  Result Date: 08/24/2019 CLINICAL DATA:  OG tube placement requested on intubated patient. EXAM: NASO G TUBE PLACEMENT WITH FL AND WITH RAD CONTRAST:  10 cc Omnipaque iodinated contrast administered via OG-tube. FLUOROSCOPY TIME:  Fluoroscopy Time:  4 minutes 24 seconds Radiation Exposure Index (if provided by the fluoroscopic device): 64.5 mGy Number of Acquired Spot Images: 0 COMPARISON:  08/22/2019 abdominal radiograph FINDINGS: A 12 Fr OG tube was successfully advanced into the distal stomach under fluoroscopic guidance with the tip verified in the region of the pylorus. IMPRESSION: Successful OG tube placement into the distal stomach under fluoroscopic guidance. Tip verified in the region of the pylorus. Tube ready for use. Electronically Signed   By: Ilona Sorrel M.D.   On: 08/24/2019 16:03   Medications: Infusions: . sodium chloride Stopped (08/22/19 1311)  . sodium chloride 35 mL (08/22/19 2239)  . dexmedetomidine (PRECEDEX) IV infusion Stopped (08/26/19  0551)  . feeding supplement (VITAL AF 1.2 CAL) 60 mL/hr at 08/26/19 0700  . magnesium sulfate bolus IVPB      Scheduled Medications: . sodium chloride   Intravenous Once  . sodium chloride   Intravenous Once  . B-complex with vitamin C  1 tablet Oral Daily  . chlorhexidine gluconate (MEDLINE KIT)  15 mL Mouth Rinse BID  . Chlorhexidine Gluconate Cloth  6 each Topical Daily  . cyanocobalamin  1,000 mcg Intramuscular Daily  . darbepoetin (ARANESP) injection - NON-DIALYSIS  150 mcg Subcutaneous Q Mon-1800  . feeding supplement (PRO-STAT SUGAR FREE 64)  30 mL Per Tube BID  . folic acid  1 mg Intravenous Daily  . insulin aspart  0-9 Units Subcutaneous Q4H  . insulin glargine  5 Units Subcutaneous QPM  . lactulose  30 g Per Tube TID  . mouth rinse  15 mL Mouth Rinse 10 times per day  . pantoprazole (PROTONIX) IV  40 mg Intravenous Q24H  . sodium chloride flush  10-40 mL Intracatheter Q12H  . sodium chloride flush  3 mL Intravenous Q12H  . sodium polystyrene  30 g Rectal Once  . thiamine  100 mg Oral Daily  Or  . thiamine  100 mg Intravenous Daily  . [START ON 08/31/2019] vitamin B-12  1,000 mcg Oral Daily    have reviewed scheduled and prn medications.  Physical Exam: General: sedated, breathing comfortably on ventilator HEENT: PEERL; ETT in place  Neck: supple; no JVD Heart: RRR Lungs: CTAB Abdomen: BS+; soft, non-distended Extremities: LLE with 1+ pitting edema; no edema on RLE  Neuro: sedated. Does not respond to verbal or noxious stimuli.    08/26/2019,9:20 AM  LOS: 15 days         

## 2019-08-26 NOTE — Progress Notes (Signed)
NAME:  Edward Nasca., MRN:  [id number], DOB:  Sep 20, 1966, LOS: 92 ADMISSION DATE:  08/11/2019, CONSULTATION DATE:  08/17/19, CHIEF COMPLAINT:  SOB  Brief History    53 yo with history of DM, HTN, HLD, thrombocytopenia, and PAD s/p aortogram with left iliac arteriogram prior to left femoral to PTA bypass graft placement on 08/11/19.  Developed oliguric renal failure, CCM consulted 10/18 due to worsening hypoxia (volume overload vs pneumonia).  Pt was treated with CVVHD x 2 days 10/19-10/21. PCCM had signed off on 10/21. Patient has a new diagnosis during this admission of cirrhosis secondary to EtOH abuse.  10/23 PCCM were re consulted for emergent intubation in setting of vomiting BRB emesis of 600 cc's decreased mental status and concern for airway protection. Pt was emergently intubated.  Past Medical History  DM HTN HLD Thrombocytopenia PAD AKI Jehovah Witness   Significant Hospital Events   08/11/19-admit for vascular procedure.  CCM on board from 10/18-10/21 for respiratory failure, renal failure requiring hemodialysis  10/21 off CRRT  10/23 - Re-consulted for emergent intubation in setting of bloody emesis and inability to protect airway in setting of massive GI v ENT hemorrhag v both GI scopy - s/p banding of 2 non-bleeding varices. Bleeding not thought to be from esophagus or stomatch ENT cx > underwent nasal packing. After this maneuver there was no further bleeding identified Wife - Edward Rocha witness - does not want PRBC unless we have to  10/24 iHD for hyperkalemia- did not tolerate due to hypotension and ended early and switched back to CRRT  10/25- nasal balloon removed by ENT with no evidence of ongoing bleed.  OG tube placed by IR  10/26 - No acute events.  Mental status continues to be poor Continues on vent, CRRT stopped   Consults:  PCCM Nephro Surgery ENT  Procedures:  RIJ HD cath 10/18 >> 10/23 ETT >> 10/25 - flexiseal following lactulse enema   Significant Diagnostic Tests:  10/14  renal ultrasound >> no hnosis  10/25 CT head-no acute intracranial abnormality 10 /26 EEG- severe diffuse encephalopathy, likely secondary to toxic-metabolic etiology. Multiple events of patient biting on ET tube were captured without any EEG change and therefore were non epileptic  Micro Data:  Covid negative (pta) 10/18 BCx2  >>neg 10/23 BCx 2 >> ngtd 4>> 10/23 trach asp >> neg  Antimicrobials:  Cefepime 10/17 >> 10/23 Vanc 10/23   Interim history/subjective:  Remains off CRRT since yesterday; no urine output ? Follows intermittent commands Required restraints and precedex overnight due to reaching for ETT (since off) LTM stopped  tmax  100.1 Remains hemodynamically stable   Objective   Blood pressure 119/63, pulse 80, temperature 98.9 F (37.2 C), temperature source Axillary, resp. rate 18, height 6\' 1"  (1.854 m), weight 84.4 kg, SpO2 100 %. CVP:  [1 mmHg-2 mmHg] 1 mmHg  Vent Mode: PRVC FiO2 (%):  [40 %] 40 % Set Rate:  [18 bmp] 18 bmp Vt Set:  [630 mL] 630 mL PEEP:  [5 cmH20] 5 cmH20 Plateau Pressure:  [19 cmH20-24 cmH20] 21 cmH20   Intake/Output Summary (Last 24 hours) at 08/26/2019 1049 Last data filed at 08/26/2019 1000 Gross per 24 hour  Intake 1432.73 ml  Output 1069 ml  Net 363.73 ml   Filed Weights   08/23/19 2036 08/25/19 0500 08/26/19 0212  Weight: 88.7 kg 88.2 kg 84.4 kg   General:  Critically ill appearing male in NAD HEENT: MM pink/moist, ETT/ OGT, pupils 4/reactive Neuro: starts  chewing motion with mouth with stimulation and moves upper extremities spont however does not respond or withdrawal to noxious stimuli, not f/c CV: rr, no murmur, +1 DP pulses, left groin site wnl PULM:  Synchronous with MV, coarse BS throughout, minimal thick tan/white secretions- doing well on my SBT 10/5 GI: soft, bs active, +BM, no foley  Extremities: warm/dry, +1 edema in LLE- leg with healing incisions, dressings dry Skin: no  rashes   Prior CXR 10/25  Resolved Hospital Problem list   NA  Assessment & Plan:   Acute Hypoxic respiratory failure in the setting of nasopharyngeal bleed s/p ballloon tamponade 08/22/2019, with volume overload +/- multifocal pneumonia  P:  Full MV support Daily SBT but Mental status remains barrier to extubation  Prn xopenex VAP measures Intermittent CXR  Off abx since 10/24- monitoring clinically off  Acute encephalopathy - CTH neg 10/25 - wife reported pt  is a heavy drinker ( drinks two 40's per day) - unclear etiology likely metabolic in the setting of hyperammonemia and uremia +/- concern for seizures P:  appreciate neurology input EEG without seizure activity, has chewing motion on ETT but does not correlate on EEG, LTM stopped 10/27 No need for AEDs per neurology  Minimize sedating meds- continue precedex prn, d/c fent/ versed Daily thiamine /folate/ MVI- well out of window for withdrawal Consider MRI if no improvement   Nasopharyngeal bleed likely from tear with massive hemorrhage 10/23 P:  ENT seen 10/25 and removed nasal packing.  Suggested to avoid NGT for another week.    AKI favor ischemic ATN due to hypotension complicated by ABLA and possible contrast nephropathy  - baseline sCr ~1 P:  Net -5.1L/ weights greatly improved  appreciate Neprhology assistance Remains anuric  Holding off on CRRT KCL 10 meq (given steady trend down with ongoing loose BM w/lacutlose) Monitoring UOP/ daily renal panel/ mag Strict I/Os  Thrombocytopenia-likely secondary to liver disease Anemia of critical illness, blood loss - Jehovah Witness  P:  S/p Iron infusion (10/25 and 10/26) w/ B12 Trend CBC  H/H/ Plts remain stable   Cirrhosis  Of liver  - likely 2/2 ETOH abuse with varices Hyperammoniemia  P: Continue lactulose 30 g TID w/ daily thiamine, folate Trending  LFTs  IDDM DMT2  10/12  8.5 A1c  P:  Continue SSI sensitive  Continue lantus 5 units daily   Best  practice:  Diet: TF at goal  Pain/Anxiety/Delirium protocol (if indicated): precedex  VAP protocol (if indicated): yes DVT prophylaxis: SCD (? Heparin for CRRT when restarted) GI prophylaxis: Protonix Mobility: BR Code Status: Full  Family Communication:  Wife, Edward Rocha [phone], updated by phone Disposition: ICU  CCT 40 mins   Kennieth Rad, MSN, AGACNP-BC [facility] 08/26/2019, 10:49 AM

## 2019-08-26 NOTE — Progress Notes (Signed)
Reason for consult: Confusion  Subjective: No acute events overnight.  No seizure-like episodes.  ROS: unable to obtain due to poor mental status  Examination  Vital signs in last 24 hours: Temp:  [96.4 F (35.8 C)-98.9 F (37.2 C)] 98.9 F (37.2 C) (10/27 0700) Pulse Rate:  [67-109] 82 (10/27 0830) Resp:  [14-24] 16 (10/27 0830) BP: (104-146)/(58-96) 126/59 (10/27 0830) SpO2:  [100 %] 100 % (10/27 0830) FiO2 (%):  [40 %] 40 % (10/27 0819) Weight:  [84.4 kg] 84.4 kg (10/27 0212)  General: lying in bed, not in apparent distress CVS: pulse-normal rate and rhythm RS: breathing comfortably, intubated Extremities: normal   Neuro: MS: Opens eyes to verbal stimuli, intermittently appears to follow commands like moving his arms CN: pupils equal and reactive,  EOMI, face symmetric Motor: Antigravity strength in all 4 extremities  reflexes: 1+ bilaterally over patella, biceps, plantars: [place]   Basic Metabolic Panel: Recent Labs  Lab 08/22/19 1524  08/23/19 0539  08/24/19 1657 08/25/19 0551 08/25/19 0807 08/25/19 1606 08/26/19 0500  NA 138   < > 141   < > 140 137 136 139 136  K 4.6   < > 5.6*   < > 4.6 3.9 3.8 3.6 3.5  CL 105  --  108   < > 106 102 102 106 104  CO2 23  --  20*   < > 22 23 24 23 22   GLUCOSE 251*  --  173*   < > 202* 135* 159* 131* 141*  BUN 96*  --  111*   < > 112* 73* 65* 51* 66*  CREATININE 3.65*  --  5.02*   < > 5.70* 4.32* 3.96* 3.37* 4.80*  CALCIUM 8.1*  --  8.2*   < > 8.0* 8.1* 8.1* 7.7* 7.5*  MG 3.5*  --  3.6*  --   --  3.2* 2.9*  --  2.8*  PHOS 4.8*  --  6.9*  --  7.4* 5.7* 5.3* 5.4* 4.9*   < > = values in this interval not displayed.    CBC: Recent Labs  Lab 08/21/19 0646  08/22/19 1254  08/23/19 1325 08/24/19 0758 08/25/19 0551 08/25/19 0807 08/26/19 0500  WBC 17.3*   < > 28.0*   < > 36.2* 36.7* 25.5* 23.8* 19.0*  NEUTROABS 12.5*  --  21.7*  --   --  28.3* 19.3*  --  12.2*  HGB 8.2*   < > 7.9*   < > 6.9* 7.7* 8.1* 8.2* 7.8*  HCT 26.4*    < > 25.8*   < > 22.5* 24.3* 26.1* 25.8* 24.5*  MCV 89.2   < > 89.0   < > 92.2 89.7 90.6 89.3 90.7  PLT 79*   < > 79*   < > 86* 61* 50* 45* 45*   < > = values in this interval not displayed.     Coagulation Studies: No results for input(s): LABPROT, INR in the last 72 hours.  I have reviewed the images obtained: CT head without contrast 08/24/2019: No acute intracranial abnormality   Assessment and plan: 53 year old complicated male with new diagnosis of hepatic cirrhosis, AKI currently on CRRT.  Neurology was consulted due to encephalopathy and concern for seizure.   Encephalopathy Hyperammonemia AKI Liver cirrhosis Thrombocytopenia Anemia Acute respiratory failure Peripheral artery disease - EEG showed triphasic waves most likely secondary to toxic metabolic disturbances including hyperammonemia, elevated BUN.  Recommendations -No seizures seen on LTM.  Will discontinue -Episodes of biting  down on ET tube are nonepileptic.  Patient does not need to be on any antiepileptic agents. -Continue management of hyperammonemia, AKI by primary team -Minimize sedating medications  Thank you for allowing [place] to participate in the care of this patient.  Neurology will sign off.  Please page the neuro hospitalist for any questions after 5 PM.   CRITICAL CARE Performed by: Lora Havens   Total critical care time: 35 minutes  Critical care time was exclusive of separately billable procedures and treating other patients.  Critical care was necessary to treat or prevent imminent or life-threatening deterioration.  Critical care was time spent personally by me on the following activities: development of treatment plan with patient and/or surrogate as well as nursing, discussions with consultants, evaluation of patient's response to treatment, examination of patient, obtaining history from patient or surrogate, ordering and performing treatments and interventions, ordering  and review of laboratory studies, ordering and review of radiographic studies, pulse oximetry and re-evaluation of patient's condition.

## 2019-08-26 NOTE — Procedures (Addendum)
Patient Name: Edward Rocha.  MRN: [id number]  Epilepsy Attending: Lora Havens  Referring Physician/Provider: Dr Zeb Comfort Duration: 08/25/2019 1133 to 08/26/2019 0907  Patient history: 53 year old male with altered mental status.  EEG to evaluate for seizures.  Level of alertness: Obtunded  AEDs during EEG study: Ativan  Technical aspects: This EEG study was done with scalp electrodes positioned according to the 10-20 International system of electrode placement. Electrical activity was acquired at a sampling rate of 500Hz  and reviewed with a high frequency filter of 70Hz  and a low frequency filter of 1Hz . EEG data were recorded continuously and digitally stored.   Description: EEG showed continuous generalized high amplitude 2 to 4 Hz delta slowing.  There are also generalized epileptiform discharges ( GPEDs)with triphasic morphology maximal bifrontal at 0.5-1Hz . They became more frequent (1.5Hz ) when patient was stimulated.  Hyperventilation and photic stimulation were not performed.  Multiple episodes of patient biting on ET tube were recorded. Concomitant eeg showed triphasic waves at 1.5hz  without any evolution.   Abnormality -Generalized Epileptiform discharges with triphasic morphology, maximal bifrontal -Continuous slow, generalized  IMPRESSION: This study showed evidence of severe diffuse encephalopathy, likely secondary to toxic-metabolic etiology. Multiple events of patient biting on ET tube were captured without any EEG change and therefore were non epileptic.   Sheffield Hawker Barbra Sarks

## 2019-08-26 NOTE — Progress Notes (Addendum)
@   2300 Pt intermittently following commands, however Pt continually attempts  to remove ETT tube despite education/ redirection from RN. MD [place] notified. Verbal ordes given to initiate bilateral soft wrist restraints for Pt safety & Precedex gtt increased to maintian Pt Rass goal. RN attempted to notifiy family, however was unable to reach Pt wife/ family at this time. Will try to reach call again.

## 2019-08-26 NOTE — Progress Notes (Signed)
SLP Cancellation Note  Patient Details Name: Edward Rocha. MRN: [id number] DOB: 1966/05/30   Cancelled treatment:       Reason Eval/Treat Not Completed: Medical issues which prohibited therapy - pt remains intubated, sedated. Will continue to follow.   [name] Cheryll Keisler 08/26/2019, 11:31 AM  Pollyann Glen, M.A. [place] [facility] Environmental education officer [phone] Office [phone]

## 2019-08-27 DIAGNOSIS — L8990 Pressure ulcer of unspecified site, unspecified stage: Secondary | ICD-10-CM | POA: Insufficient documentation

## 2019-08-27 DIAGNOSIS — J9600 Acute respiratory failure, unspecified whether with hypoxia or hypercapnia: Secondary | ICD-10-CM | POA: Diagnosis not present

## 2019-08-27 LAB — CBC
HCT: 24.4 % — ABNORMAL LOW (ref 39.0–52.0)
Hemoglobin: 7.6 g/dL — ABNORMAL LOW (ref 13.0–17.0)
MCH: 29.1 pg (ref 26.0–34.0)
MCHC: 31.1 g/dL (ref 30.0–36.0)
MCV: 93.5 fL (ref 80.0–100.0)
Platelets: 46 10*3/uL — ABNORMAL LOW (ref 150–400)
RBC: 2.61 MIL/uL — ABNORMAL LOW (ref 4.22–5.81)
RDW: 19.3 % — ABNORMAL HIGH (ref 11.5–15.5)
WBC: 18.9 10*3/uL — ABNORMAL HIGH (ref 4.0–10.5)
nRBC: 1.6 % — ABNORMAL HIGH (ref 0.0–0.2)

## 2019-08-27 LAB — CULTURE, BLOOD (ROUTINE X 2)
Culture: NO GROWTH
Special Requests: ADEQUATE

## 2019-08-27 LAB — RENAL FUNCTION PANEL
Albumin: 1.7 g/dL — ABNORMAL LOW (ref 3.5–5.0)
Anion gap: 10 (ref 5–15)
BUN: 87 mg/dL — ABNORMAL HIGH (ref 6–20)
CO2: 21 mmol/L — ABNORMAL LOW (ref 22–32)
Calcium: 8 mg/dL — ABNORMAL LOW (ref 8.9–10.3)
Chloride: 108 mmol/L (ref 98–111)
Creatinine, Ser: 6.36 mg/dL — ABNORMAL HIGH (ref 0.61–1.24)
GFR calc Af Amer: 10 mL/min — ABNORMAL LOW (ref >60)
GFR calc non Af Amer: 9 mL/min — ABNORMAL LOW (ref >60)
Glucose, Bld: 192 mg/dL — ABNORMAL HIGH (ref 70–99)
Phosphorus: 4.4 mg/dL (ref 2.5–4.6)
Potassium: 3.4 mmol/L — ABNORMAL LOW (ref 3.5–5.1)
Sodium: 139 mmol/L (ref 135–145)

## 2019-08-27 LAB — GLUCOSE, CAPILLARY
Glucose-Capillary: 160 mg/dL — ABNORMAL HIGH (ref 70–99)
Glucose-Capillary: 166 mg/dL — ABNORMAL HIGH (ref 70–99)
Glucose-Capillary: 193 mg/dL — ABNORMAL HIGH (ref 70–99)
Glucose-Capillary: 199 mg/dL — ABNORMAL HIGH (ref 70–99)
Glucose-Capillary: 207 mg/dL — ABNORMAL HIGH (ref 70–99)
Glucose-Capillary: 219 mg/dL — ABNORMAL HIGH (ref 70–99)
Glucose-Capillary: 266 mg/dL — ABNORMAL HIGH (ref 70–99)

## 2019-08-27 LAB — PROTIME-INR
INR: 1.7 — ABNORMAL HIGH (ref 0.8–1.2)
Prothrombin Time: 20.1 seconds — ABNORMAL HIGH (ref 11.4–15.2)

## 2019-08-27 LAB — MAGNESIUM: Magnesium: 3.2 mg/dL — ABNORMAL HIGH (ref 1.7–2.4)

## 2019-08-27 MED ORDER — B COMPLEX-C PO TABS
1.0000 | ORAL_TABLET | Freq: Every day | ORAL | Status: DC
  Administered 2019-08-27 – 2019-08-29 (×3): 1
  Filled 2019-08-27 (×3): qty 1

## 2019-08-27 MED ORDER — CHLORHEXIDINE GLUCONATE CLOTH 2 % EX PADS
6.0000 | MEDICATED_PAD | Freq: Every day | CUTANEOUS | Status: DC
  Administered 2019-08-31 – 2019-09-06 (×6): 6 via TOPICAL

## 2019-08-27 NOTE — Procedures (Signed)
Extubation Procedure Note  Patient Details:   Name: Edward Rocha. DOB: 05-06-1966 MRN: [id number]   Airway Documentation:    Vent end date: 08/27/19 Vent end time: 1126   Evaluation  O2 sats: stable throughout Complications: No apparent complications Patient did tolerate procedure well. Bilateral Breath Sounds: Clear, Diminished   Yes  4l/min    Revonda Standard 08/27/2019, 11:27 AM

## 2019-08-27 NOTE — Progress Notes (Signed)
Subjective:  Patient seen and evaluated at bedside. No acute events overnight. Patient is intubated but awakens to voice and will follow some commands. Remains anuric.    Objective Vital signs in last 24 hours: Vitals:   08/27/19 0500 08/27/19 0600 08/27/19 0700 08/27/19 0754  BP: (!) 162/70 (!) 153/63 (!) 155/69   Pulse: 84 69 80   Resp: '18 18 18   ' Temp:    98.9 F (37.2 C)  TempSrc:    Oral  SpO2: 100% 100% 100%   Weight: 82.7 kg     Height:       Weight change: -1.7 kg  Intake/Output Summary (Last 24 hours) at 08/27/2019 0804 Last data filed at 08/27/2019 0700 Gross per 24 hour  Intake 1615.96 ml  Output 400 ml  Net 1215.96 ml    Assessment/ Plan: Pt is a53 y.o.yo malewho was admitted on 10/12/2020for left femoral to PTA bypass graft placement and subsequently developed oliguric renal failure requiring CRRT.  Assessment/Plan: 1.AKI (baseline crt ~1) - multifactorial in the setting of ischemic ATN with ABLA and relative hypotension, contrast nephropathy and BOO. Ultimately required initiation of CRRT on 10/18(temp cath placed)due to profound volume overloadafter failing high doses of Lasix and metolazone.Labs and volume statusgreatly improved as aresult of CRRT which was discontinued 10/21 at 15:30.Intermittent HD treatments on 08/23/2019 for hyperkalemia. Did not tolerate well due to hypotension. Treatment was ended early.Placed back on CRRT for persistent hyperkalemia which was discontinued again on 10/26. This morning he remains anuric and thus BUN and creatinine continue to climb. Potassium is stable. Will plan to resume on IHD today since he should have enough blood pressure to tolerate.   2. Acute hypoxic respiratory failure - volume overload vs multifocal pneumonia. Reintubated on Fridayfor airway protection in the setting ofnasopharyngeal bleed. Oxygenation stable. Mental status primary barrier to extubation which seems to be improving somewhat today.    3. ABLA -  transfuse as needed to keep hgb > 7. ContinueESA.  4. Hyponatremia due to hypervolemia -resolved  5. HTN -blood pressure stable and should do ok on IHD. Will continue to monitor.  6. Thrombocytopenia - chronic and worked up by hematology without etiology. HIT panel negative.   7. DM type 2 - poorly controlled. Holding Invokana due to AKI. SSI as needed   8. Elytes -K stable;corrected calcium ok;phos ok  9. Metabolic acidosis - due to acute renal failure;resolved with CRRT  Edward Rocha    Labs: Basic Metabolic Panel: Recent Labs  Lab 08/25/19 1606 08/26/19 0500 08/27/19 0351  NA 139 136 139  K 3.6 3.5 3.4*  CL 106 104 108  CO2 23 22 21*  GLUCOSE 131* 141* 192*  BUN 51* 66* 87*  CREATININE 3.37* 4.80* 6.36*  CALCIUM 7.7* 7.5* 8.0*  PHOS 5.4* 4.9* 4.4   Liver Function Tests: Recent Labs  Lab 08/25/19 0551 08/25/19 0807 08/25/19 1606 08/26/19 0500 08/27/19 0351  AST 554* 554*  --  411*  --   ALT 155* 155*  --  124*  --   ALKPHOS 105 101  --  84  --   BILITOT 4.0* 4.2*  --  2.8*  --   PROT 8.1 7.7  --  6.9  --   ALBUMIN 1.9* 1.8* 1.7* 1.6* 1.7*   No results for input(s): LIPASE, AMYLASE in the last 168 hours. Recent Labs  Lab 08/22/19 0431 08/25/19 0807 08/26/19 0816  AMMONIA 60* 59* 55*   CBC: Recent Labs  Lab 08/24/19 0758  08/25/19 0551 08/25/19 0807 08/26/19 0500 08/27/19 0351  WBC 36.7* 25.5* 23.8* 19.0* 18.9*  NEUTROABS 28.3* 19.3*  --  12.2*  --   HGB 7.7* 8.1* 8.2* 7.8* 7.6*  HCT 24.3* 26.1* 25.8* 24.5* 24.4*  MCV 89.7 90.6 89.3 90.7 93.5  PLT 61* 50* 45* 45* 46*   Cardiac Enzymes: No results for input(s): CKTOTAL, CKMB, CKMBINDEX, TROPONINI in the last 168 hours. CBG: Recent Labs  Lab 08/26/19 1552 08/26/19 1942 08/27/19 0017 08/27/19 0349 08/27/19 0759  GLUCAP 248* 219* 219* 166* 199*    Iron Studies: No results for input(s): IRON, TIBC, TRANSFERRIN, FERRITIN in the last 72  hours. Studies/Results: No results found. Medications: Infusions: . sodium chloride Stopped (08/22/19 1311)  . sodium chloride 35 mL (08/22/19 2239)  . dexmedetomidine (PRECEDEX) IV infusion 0.2 mcg/kg/hr (08/27/19 0700)  . feeding supplement (VITAL AF 1.2 CAL) 1,000 mL (08/26/19 1630)  . magnesium sulfate bolus IVPB      Scheduled Medications: . sodium chloride   Intravenous Once  . sodium chloride   Intravenous Once  . B-complex with vitamin C  1 tablet Oral Daily  . chlorhexidine gluconate (MEDLINE KIT)  15 mL Mouth Rinse BID  . Chlorhexidine Gluconate Cloth  6 each Topical Daily  . cyanocobalamin  1,000 mcg Intramuscular Daily  . darbepoetin (ARANESP) injection - NON-DIALYSIS  150 mcg Subcutaneous Q Mon-1800  . feeding supplement (PRO-STAT SUGAR FREE 64)  30 mL Per Tube BID  . folic acid  1 mg Intravenous Daily  . insulin aspart  0-9 Units Subcutaneous Q4H  . insulin glargine  5 Units Subcutaneous QPM  . lactulose  30 g Per Tube TID  . mouth rinse  15 mL Mouth Rinse 10 times per day  . pantoprazole (PROTONIX) IV  40 mg Intravenous Q24H  . sodium chloride flush  10-40 mL Intracatheter Q12H  . sodium chloride flush  3 mL Intravenous Q12H  . thiamine  100 mg Oral Daily   Or  . thiamine  100 mg Intravenous Daily  . [START ON 08/31/2019] vitamin B-12  1,000 mcg Oral Daily    have reviewed scheduled and prn medications.  Physical Exam: General: intubated, breathing comfortably on ventilator HEENT: PEERL; ETT in place Neck: supple; no JVD Heart: RRR Lungs: CTAB Abdomen: mildly distended, non-tender, BS+ Extremities: LLE with 1+ pitting edema; trace edema on RLE  Neuro: arouses to voice and follows simple commands intermittently    08/27/2019,8:04 AM  LOS: 16 days

## 2019-08-27 NOTE — Progress Notes (Signed)
[facility]   Subjective  -remains intubated, following commands.  No major events overnight.    Objective (!) 142/61 79 98.9 F (37.2 C) (Oral) 20 100%  Intake/Output Summary (Last 24 hours) at 08/27/2019 0954 Last data filed at 08/27/2019 0800 Gross per 24 hour  Intake 1550.39 ml  Output 400 ml  Net 1150.39 ml   Intubated OG tube in place Left PT/Dp signal Left groin incision c/d/i - small amount fat necrosis Left leg incisions c/d/i - some serous drainage Abdomen soft  Laboratory Lab Results: Recent Labs    08/26/19 0500 08/27/19 0351  WBC 19.0* 18.9*  HGB 7.8* 7.6*  HCT 24.5* 24.4*  PLT 45* 46*   BMET Recent Labs    08/26/19 0500 08/27/19 0351  NA 136 139  K 3.5 3.4*  CL 104 108  CO2 22 21*  GLUCOSE 141* 192*  BUN 66* 87*  CREATININE 4.80* 6.36*  CALCIUM 7.5* 8.0*    COAG Lab Results  Component Value Date   INR 1.7 (H) 08/27/2019   INR 1.4 (H) 08/22/2019   INR 1.5 (H) 08/22/2019   No results found for: PTT  Assessment/Planning:  Postop day 16 status post left common femoral to PT bypass with composite PTFE and GSV.   Neuro: Now intubated for airway protection after bleeding from his nasopharynx Friday.  Now following commands.  CT head negative for any acute abnormality yesterday.  EEG to r/o seizure - no seizure - encephalopathy.  Appreciate neurology input. Significant mental issues postop.  Initially raised the question of withdrawal.  He is on CIWA protocol now.  Also probably has a component of hepatic encephalopathy given evidence of cirrhosis.  Continue lactulose.  Ammonia slowly improving.   Hopefully can be extubated with mental status improving. [id number] post-op.  Hgb 7.6, no indication for transfusion.  Platelets 46 - not bleeding - no immediate indication for transfusion.. Pulm: Intubated.  Minimal vent settings.  Mental status improving.  Hopefully extubated today pending CCM evaluation.  [facility] GI: OG  tube placed by radiology.  Had nasopharynx bleed on Friday.  Right naris packed with Foley catheter for balloon tamponade - removed.  Appreciate ENT and GI seeing him.  We have stopped his aspirin Plavix.  Tolerating tube feeds. Renal: AKI post-op progressed to oliguric renal failure.  Tried aggressive diuresis with high dose lasix and no improvement with worsening respiratory status.  CRRT and pulled off 10 L.  Likely will require iHD, no anuric. Has chronic thrombocytopenia that was worked up by hematology prior to surgery and previously had trial of steroids with no improvement. .  I now suspect he has a component of cirrhosis given varices on EGD yesterday with chronically low platelets elevated liver enzymes and nodular findings on previous liver ultrasound.  Wife now states heavy ETOH abuse prior to surgery. ID: White blood cells improving.  On Vanc cefepime.  Cultures no growth to date. FEN: Remain saline locked.  Appreciate nephrology input.  Remains critically ill in the ICU.    Tolerating CRRT and labs improving.  Ammonia is improving with lactulose.  Appreciate nephrology/neurology/CCM input.  Extubate today hopefully.   [name] 08/27/2019 9:54 AM --

## 2019-08-27 NOTE — Progress Notes (Signed)
NAME:  Edward Rocha., MRN:  [id number], DOB:  05/19/1966, LOS: 30 ADMISSION DATE:  08/11/2019, CONSULTATION DATE:  08/17/19, CHIEF COMPLAINT:  SOB  Brief History    53 yo with history of DM, HTN, HLD, thrombocytopenia, and PAD s/p aortogram with left iliac arteriogram prior to left femoral to PTA bypass graft placement on 08/11/19.  Developed oliguric renal failure, CCM consulted 10/18 due to worsening hypoxia (volume overload vs pneumonia).  Pt was treated with CVVHD x 2 days 10/19-10/21. PCCM had signed off on 10/21. Patient has a new diagnosis during this admission of cirrhosis secondary to EtOH abuse.  10/23 PCCM were re consulted for emergent intubation in setting of vomiting BRB emesis of 600 cc's decreased mental status and concern for airway protection. Pt was emergently intubated.  Past Medical History  DM HTN HLD Thrombocytopenia PAD AKI Jehovah Witness   Significant Hospital Events   08/11/19-admit for vascular procedure.  CCM on board from 10/18-10/21 for respiratory failure, renal failure requiring hemodialysis  10/21 off CRRT  10/23 - Re-consulted for emergent intubation in setting of bloody emesis and inability to protect airway in setting of massive GI v ENT hemorrhag v both GI scopy - s/p banding of 2 non-bleeding varices. Bleeding not thought to be from esophagus or stomatch ENT cx > underwent nasal packing. After this maneuver there was no further bleeding identified Wife - [name] witness - does not want PRBC unless we have to  10/24 iHD for hyperkalemia- did not tolerate due to hypotension and ended early and switched back to CRRT  10/25- nasal balloon removed by ENT with no evidence of ongoing bleed.  OG tube placed by IR  10/26 - No acute events.  Mental status continues to be poor Continues on vent, CRRT stopped   Consults:  PCCM Nephro Surgery ENT  Procedures:  RIJ HD cath 10/18 >> 10/23 ETT >> 10/25 - flexiseal following lactulse enema   Significant Diagnostic Tests:  10/14  renal ultrasound >> no hnosis  10/25 CT head-no acute intracranial abnormality 10 /26 EEG- severe diffuse encephalopathy, likely secondary to toxic-metabolic etiology. Multiple events of patient biting on ET tube were captured without any EEG change and therefore were non epileptic  Micro Data:  Covid negative (pta) 10/18 BCx2  >>neg 10/23 BCx 2 >> 1/4 GPCs 10/23 trach asp >> neg  Antimicrobials:  Cefepime 10/17 >> 10/23 Vanc 10/23   Interim history/subjective:  Much more awake today.  Weaning on pressure support Off CRRT.  Has remained hemodynamically stable.  Objective   Blood pressure (!) 155/69, pulse 80, temperature 98.9 F (37.2 C), temperature source Oral, resp. rate 18, height 6\' 1"  (1.854 m), weight 82.7 kg, SpO2 100 %.    Vent Mode: PRVC FiO2 (%):  [40 %] 40 % Set Rate:  [18 bmp] 18 bmp Vt Set:  [630 mL] 630 mL PEEP:  [5 cmH20] 5 cmH20 Pressure Support:  [10 cmH20] 10 cmH20 Plateau Pressure:  [21 cmH20-22 cmH20] 22 cmH20   Intake/Output Summary (Last 24 hours) at 08/27/2019 0832 Last data filed at 08/27/2019 0700 Gross per 24 hour  Intake 1615.96 ml  Output 400 ml  Net 1215.96 ml   Filed Weights   08/25/19 0500 08/26/19 0212 08/27/19 0500  Weight: 88.2 kg 84.4 kg 82.7 kg   Gen:      No acute distress HEENT:  EOMI, sclera anicteric Neck:     No masses; no thyromegaly Lungs:    Clear to auscultation bilaterally; normal  respiratory effort CV:         Regular rate and rhythm; no murmurs Abd:      + bowel sounds; soft, non-tender; no palpable masses, no distension Ext:    No edema; adequate peripheral perfusion Skin:      Warm and dry; no rash Neuro: Somnolent, follows commands.   Resolved Hospital Problem list   NA  Assessment & Plan:   Acute Hypoxic respiratory failure in the setting of nasopharyngeal bleed s/p ballloon tamponade 08/22/2019, with volume overload +/- multifocal pneumonia  P:  Doing well on  weaning trial Will extubated Prn xopenex VAP measures Intermittent CXR   Acute encephalopathy - CTH neg 10/25 - wife reported pt  is a heavy drinker ( drinks two 40's per day) - unclear etiology likely metabolic in the setting of hyperammonemia and uremia +/- concern for seizures P:  appreciate neurology input EEG without seizure activity, has chewing motion on ETT but does not correlate on EEG, LTM stopped 10/27 No need for AEDs per neurology  Minimize sedating meds Daily thiamine /folate/ MVI- well out of window for withdrawal  Nasopharyngeal bleed likely from tear with massive hemorrhage 10/23 P:  ENT seen 10/25 and removed nasal packing.  Suggested to avoid NGT for another week.    AKI favor ischemic ATN due to hypotension complicated by ABLA and possible contrast nephropathy  - baseline sCr ~1 P:  Net -5.1L/ weights greatly improved  appreciate Neprhology assistance Remains anuric  Holding off on CRRT. May need HD tomorrow  Thrombocytopenia-likely secondary to liver disease Anemia of critical illness, blood loss - Jehovah Witness  P:  S/p Iron infusion (10/25 and 10/26) w/ B12 Trend CBC  H/H/ Plts remain stable   Cirrhosis  Of liver  - likely 2/2 ETOH abuse with varices Hyperammoniemia  P: Continue lactulose 30 g TID w/ daily thiamine, folate Trending  LFTs  IDDM DMT2  10/12  8.5 A1c  P:  Continue SSI sensitive  Continue lantus 5 units daily   Best practice:  Diet: TF at goal  Pain/Anxiety/Delirium protocol (if indicated): precedex  VAP protocol (if indicated): yes DVT prophylaxis: SCD (? Heparin for CRRT when restarted) GI prophylaxis: Protonix Mobility: BR Code Status: Full  Family Communication:  Wife, [name] [phone], updated by phone Disposition: ICU  The patient is critically ill with multiple organ system failure and requires high complexity decision making for assessment and support, frequent evaluation and titration of therapies, advanced  monitoring, review of radiographic studies and interpretation of complex data.   Critical Care Time devoted to patient care services, exclusive of separately billable procedures, described in this note is 35 minutes.   Marshell Garfinkel MD [facility] Pager [phone] If no answer call [phone] 08/27/2019, 11:27 AM

## 2019-08-27 NOTE — Progress Notes (Signed)
Patient confused and became agitated. Patient started pulling at IV tubing and ECG monitor leads while yelling at this RN and another RN attempting to assist. Patient pulled out left PIV. This RN applied soft wrist restraints and contacted Dr. Vaughan Browner. See new orders. Also given verbal orders to restart precedex in order to maintain RASS 0 to -1. Will continue to monitor.    Luberta Mutter RN 08/27/19 1200

## 2019-08-27 NOTE — Progress Notes (Signed)
Orders received from Dr. Justin Mend to change hemodialysis orders to tomorrow Thursday, October 29.  Luberta Mutter, RN notified of change.

## 2019-08-27 NOTE — Progress Notes (Signed)
PHARMACY - PHYSICIAN COMMUNICATION CRITICAL VALUE ALERT - BLOOD CULTURE IDENTIFICATION (BCID)  Edward Rocha. is an 53 y.o. male who presented to [facility] on 08/11/2019 for a femoral bypass.  Postop complicated with AKI requiring dialysis, nasal bleed with packing, new diagnosis of cirrhosis with encephalopathy, acute respiratory failure  Assessment: 1/4 BC positive for GPRs, will not be running BCID  Name of physician (or Provider) Contacted: Dr. Vaughan Browner  Current antibiotics: None  Changes to prescribed antibiotics recommended:  Monitor off abx for now, suspect contaminant   No results found for this or any previous visit.  Edward Rocha [facility] 08/27/2019  11:07 AM

## 2019-08-28 ENCOUNTER — Inpatient Hospital Stay (HOSPITAL_COMMUNITY): Payer: Managed Care, Other (non HMO)

## 2019-08-28 DIAGNOSIS — G9340 Encephalopathy, unspecified: Secondary | ICD-10-CM | POA: Diagnosis not present

## 2019-08-28 DIAGNOSIS — F05 Delirium due to known physiological condition: Secondary | ICD-10-CM | POA: Diagnosis not present

## 2019-08-28 LAB — COMPREHENSIVE METABOLIC PANEL
ALT: 83 U/L — ABNORMAL HIGH (ref 0–44)
AST: 166 U/L — ABNORMAL HIGH (ref 15–41)
Albumin: 1.8 g/dL — ABNORMAL LOW (ref 3.5–5.0)
Alkaline Phosphatase: 92 U/L (ref 38–126)
Anion gap: 10 (ref 5–15)
BUN: 97 mg/dL — ABNORMAL HIGH (ref 6–20)
CO2: 21 mmol/L — ABNORMAL LOW (ref 22–32)
Calcium: 8.2 mg/dL — ABNORMAL LOW (ref 8.9–10.3)
Chloride: 110 mmol/L (ref 98–111)
Creatinine, Ser: 5.19 mg/dL — ABNORMAL HIGH (ref 0.61–1.24)
GFR calc Af Amer: 13 mL/min — ABNORMAL LOW (ref >60)
GFR calc non Af Amer: 12 mL/min — ABNORMAL LOW (ref >60)
Glucose, Bld: 172 mg/dL — ABNORMAL HIGH (ref 70–99)
Potassium: 3.7 mmol/L (ref 3.5–5.1)
Sodium: 141 mmol/L (ref 135–145)
Total Bilirubin: 2.4 mg/dL — ABNORMAL HIGH (ref 0.3–1.2)
Total Protein: 7.6 g/dL (ref 6.5–8.1)

## 2019-08-28 LAB — PHOSPHORUS: Phosphorus: 6 mg/dL — ABNORMAL HIGH (ref 2.5–4.6)

## 2019-08-28 LAB — GLUCOSE, CAPILLARY
Glucose-Capillary: 160 mg/dL — ABNORMAL HIGH (ref 70–99)
Glucose-Capillary: 146 mg/dL — ABNORMAL HIGH (ref 70–99)
Glucose-Capillary: 148 mg/dL — ABNORMAL HIGH (ref 70–99)
Glucose-Capillary: 214 mg/dL — ABNORMAL HIGH (ref 70–99)

## 2019-08-28 LAB — AMMONIA: Ammonia: 91 umol/L — ABNORMAL HIGH (ref 9–35)

## 2019-08-28 LAB — MAGNESIUM: Magnesium: 3 mg/dL — ABNORMAL HIGH (ref 1.7–2.4)

## 2019-08-28 MED ORDER — HEPARIN SODIUM (PORCINE) 1000 UNIT/ML IJ SOLN
INTRAMUSCULAR | Status: AC
  Filled 2019-08-28: qty 4

## 2019-08-28 MED ORDER — CHLORHEXIDINE GLUCONATE CLOTH 2 % EX PADS: 6.0000 | MEDICATED_PAD | Freq: Every day | CUTANEOUS | Status: DC

## 2019-08-28 NOTE — Progress Notes (Addendum)
Pt not long 10 mins after initiated of  hemodialysis treatment patient's BP 63/43 . NS 200 ml  bolus given  and UF turn off. Dr. Justin Mend notified.Decreased goal to keep even  HD treatment for filtration/ cleansing because pt's  hemodynamic instability. Pt completed HD tx.. Last  BP 130/71.HR 90. O2 sat 100%.

## 2019-08-28 NOTE — Progress Notes (Signed)
  Speech Language Pathology Treatment: Dysphagia  Patient Details Name: Edward Rocha. MRN: [id number] DOB: 1965/12/18 Today's Date: 08/28/2019 Time: [id number] SLP Time Calculation (min) (ACUTE ONLY): 20 min  Assessment / Plan / Recommendation Clinical Impression  Pt was extubated on previous date, presenting as more alert than he was prior to intubation although still confused. He has decreased awareness for bolus acceptance, but has more improved automaticity to initiate bolus manipulation and swallowing. He initially consumed boluses without overt signs of aspiration, although needed Mod cues to not talking with food in his mouth and attend to bolus. His voice is loud and clear post-extubation. When we resumed thin liquids after he had eaten purees, he exhibited multiple, audible subswallows with the outward appearance of poor coordination. Question if this could have been related to larger volume and his altered mentation or if there could have been residue in the pharynx. Either way, pt had a strong cough response with what appeared to be liquids mixed with small amounts of puree that were ejected from his oral cavity and his nares. SLP provided suction with yankauer. After this episode pt's  VS appeared to be stable, his voice was still clear, and he had no audible congestion.  Suspect that his altered mentation perhaps combined with an element of post-extubation could be causing him to exhibit dysphagia symptoms at the moment. Recommend allowing small amounts of ice after oral care with staff, and could offer meds crushed in puree. Will f/u for potential to begin PO diet.    HPI HPI: Pt is a 53 yo who presented on 10/12 for L common femoral to posterior tibial composite bypass with PTFE and saphenous vein. Hospital course was complicated by AKI with CRRT started 10/18, acute respiratory failure, and AMS. PMH includes: DM, HTN, HLD, thrombocytpenia, and PAD s/p bilateral common iliac stenting on  4/9      SLP Plan  Continue with current plan of care       Recommendations  Diet recommendations: NPO;Other(comment)(ice chips after oral care with staff) Medication Administration: Crushed with puree                Oral Care Recommendations: Oral care QID Follow up Recommendations: Skilled Nursing facility SLP Visit Diagnosis: Dysphagia, oropharyngeal phase (R13.12) Plan: Continue with current plan of care       GO                [name] Dlisa Barnwell 08/28/2019, 9:14 AM  Pollyann Glen, M.A. [place] Acute Environmental education officer [phone] Office [phone]

## 2019-08-28 NOTE — Progress Notes (Signed)
NAME:  Edward Ruschak., MRN:  [id number], DOB:  [birth year], LOS: 79 ADMISSION DATE:  08/11/2019, CONSULTATION DATE:  08/17/19, CHIEF COMPLAINT:  SOB  Brief History    53 yo with history of DM, HTN, HLD, thrombocytopenia, and PAD s/p aortogram with left iliac arteriogram prior to left femoral to PTA bypass graft placement on 08/11/19.  Developed oliguric renal failure, CCM consulted 10/18 due to worsening hypoxia (volume overload vs pneumonia).  Pt was treated with CVVHD x 2 days 10/19-10/21. PCCM had signed off on 10/21. Patient has a new diagnosis during this admission of cirrhosis secondary to EtOH abuse.  10/23 PCCM were re consulted for emergent intubation in setting of vomiting BRB emesis of 600 cc's decreased mental status and concern for airway protection. Pt was emergently intubated.  Past Medical History  DM HTN HLD Thrombocytopenia PAD AKI Jehovah Witness   Significant Hospital Events   08/11/19-admit for vascular procedure.  CCM on board from 10/18-10/21 for respiratory failure, renal failure requiring hemodialysis  10/21 off CRRT  10/23 - Re-consulted for emergent intubation in setting of bloody emesis and inability to protect airway in setting of massive GI v ENT hemorrhag v both GI scopy - s/p banding of 2 non-bleeding varices. Bleeding not thought to be from esophagus or stomatch ENT cx > underwent nasal packing. After this maneuver there was no further bleeding identified Wife - [name] witness - does not want PRBC unless we have to  10/24 iHD for hyperkalemia- did not tolerate due to hypotension and ended early and switched back to CRRT  10/25- nasal balloon removed by ENT with no evidence of ongoing bleed.  OG tube placed by IR  10/26 - No acute events.  Mental status continues to be poor Continues on vent, CRRT stopped   10/29 receiving iHD. Remains delirious   Consults:  PCCM Nephro Surgery ENT  Procedures:  [id number] HD cath 10/18 >> 10/23 ETT >> 10/25 -  flexiseal following lactulse enema  Significant Diagnostic Tests:  10/14  renal ultrasound >> no hnosis  10/25 CT head-no acute intracranial abnormality 10 /26 EEG- severe diffuse encephalopathy, likely secondary to toxic-metabolic etiology. Multiple events of patient biting on ET tube were captured without any EEG change and therefore were non epileptic  Micro Data:  Covid negative (pta) 10/18 BCx2  >>neg 10/23 BCx 2 >> 1/4 GPCs 10/23 trach asp >> neg  Antimicrobials:  Cefepime 10/17 >> 10/23 Vanc 10/23   Interim history/subjective:  Remains extubated.  Remains delirious and is yelling out to "stand me up" "I need to doo-doo" "Help me up" "Let me out"  Actively receiving iHD   Objective   Blood pressure 135/63, pulse 70, temperature 97.7 F (36.5 C), temperature source Axillary, resp. rate 16, height 6\' 1"  (1.854 m), weight 83.4 kg, SpO2 100 %. CVP:  [5 mmHg] 5 mmHg  Vent Mode: PSV;CPAP FiO2 (%):  [40 %] 40 % PEEP:  [5 cmH20] 5 cmH20 Pressure Support:  [5 cmH20] 5 cmH20   Intake/Output Summary (Last 24 hours) at 08/28/2019 0848 Last data filed at 08/28/2019 0800 Gross per 24 hour  Intake 218.9 ml  Output 500 ml  Net -281.1 ml   Filed Weights   08/26/19 0212 08/27/19 0500 08/28/19 0600  Weight: 84.4 kg 82.7 kg 83.4 kg   Gen:      Chronically ill appearing M. Appears older than stated age. Reclined in bed in 2pt BUE soft restraints and is agitated appearing  HEENT:  NCAT. Trachea  midline. Pink mmm.  Lungs:   CTA bilaterally. Symmetrical chest expansion. No accessory muscle use on RA  CV:         RRR s1s2 no rgm. Cap refill < 3 seconds  Abd:      Soft, round, ndnt. + bowel sounds  Ext:    Symmetrical bulk and tone. No cyanosis or clubbing.   Skin:      C/d/w/i Neuro:   Awake, alert. Is oriented to self. CAM ICU +   Resolved Hospital Problem list   Acute hypoxic respiratory failure   Assessment & Plan:   Acute hypoxic respiratory failure - resolved -remains  extubated  -at risk for atelectasis given bedrest  P IS, Flutter  When encephalopathy is improved, PT when appopriate  Acute Encephalopathy  -suspect hyperactive ICU delirium - CTH neg 10/25 - wife reported pt  is a heavy drinker ( drinks two 40's per day). Out of window for EtOH dt at this juncture  - component of hepatic encephalopathy  -Initial c/f seizures however this evaluation has been neg  P:  No need for AEDs per neurology  Minimize sedating meds Continue B vitamins, MVs  Wean precedex as able   Check ammonia and resume lactulose (10/28 was held)  Possible role for antipsychotics however would defer at present time and check qtc  Delirium precautions   Nasopharyngeal bleed - likely from tear with massive hemorrhage 10/23 P:  ENT seen 10/25 and removed nasal packing.   Per ENT defer NGT additional week (I anticipate in setting of above encephalopathy patient is likely to self-remove NGT if placed)   AKI favor ischemic ATN due to hypotension complicated by ABLA and possible contrast nephropathy  - baseline sCr ~1 Hyperphosphatemia, hypermagnesemia  P:  Nephrology following iHD today  Trend electrolytes, renal indices   Thrombocytopenia -likely secondary to liver disease Anemia of critical illness, blood loss - Jehovah Witness  -S/p Iron infusion (10/25 and 10/26) w/ B12 P:  -trend CBC PRN. Attempt to minimize lab draws as able    Hepatic cirrhosis  - likely 2/2 ETOH abuse with varices Hyperammoniemia  P: Resume lactulose 30 g TID  Trend ammonia   IDDM 10/12  8.5 A1c  P:  Lantus + SSI  Best practice:  Diet: NPO Pain/Anxiety/Delirium protocol (if indicated): precedex  VAP protocol (if indicated): na DVT prophylaxis: SCD GI prophylaxis: Protonix Mobility: BR Code Status: Full  Family Communication:  Wife, [name] [phone] Disposition: ICU  CRITICAL CARE Performed by: Cristal Generous   Total critical care time: 35 minutes  Critical care time  was exclusive of separately billable procedures and treating other patients. Critical care was necessary to treat or prevent imminent or life-threatening deterioration.  Critical care was time spent personally by me on the following activities: development of treatment plan with patient and/or surrogate as well as nursing, discussions with consultants, evaluation of patient's response to treatment, examination of patient, obtaining history from patient or surrogate, ordering and performing treatments and interventions, ordering and review of laboratory studies, ordering and review of radiographic studies, pulse oximetry and re-evaluation of patient's condition.  Eliseo Gum MSN, AGACNP-BC [place] [id number] If no answer, [id number] 08/28/2019, 8:49 AM

## 2019-08-28 NOTE — Progress Notes (Signed)
[facility] [place]  Subjective  - extubated.  Agitated.    Objective 97/63 (!) 105 97.7 F (36.5 C) (Axillary) (!) 33 100%  Intake/Output Summary (Last 24 hours) at 08/28/2019 1059 Last data filed at 08/28/2019 1000 Gross per 24 hour  Intake 227.65 ml  Output 500 ml  Net -272.35 ml   Extubated, moving all extremities, agitated. Left PT/Dp signal Left groin incision c/d/i - small amount fat necrosis superiorly - no significant drainage - no cellulitis Left leg incisions c/d/i - some serous drainage proximally at vein harvest site Abdomen soft  Laboratory Lab Results: Recent Labs    08/26/19 0500 08/27/19 0351  WBC 19.0* 18.9*  HGB 7.8* 7.6*  HCT 24.5* 24.4*  PLT 45* 46*   BMET Recent Labs    08/27/19 0351 08/28/19 0316  NA 139 141  K 3.4* 3.7  CL 108 110  CO2 21* 21*  GLUCOSE 192* 172*  BUN 87* 97*  CREATININE 6.36* 5.19*  CALCIUM 8.0* 8.2*    COAG Lab Results  Component Value Date   INR 1.7 (H) 08/27/2019   INR 1.4 (H) 08/22/2019   INR 1.5 (H) 08/22/2019   No results found for: PTT  Assessment/Planning:  Postop day 17 status post left common femoral to PT bypass with composite PTFE and GSV.   Neuro: Now extubated (previously intubated for airway protection after bleeding from his nasopharynx Friday).  CT head negative for any acute abnormality.  EEG to r/o seizure - no seizure - encephalopathy.  Appreciate neurology input.  Significant mental issues postop.  Initially raised the question of withdrawal.  He is on CIWA protocol now.  Also probably has a component of hepatic encephalopathy given evidence of cirrhosis.  Continue lactulose.   [id number] post-op.  Hgb 7.6 yesterday, no indication for transfusion.  Platelets 46 - not bleeding - no immediate indication for transfusion.. Pulm: Extubated, room air, no respiratory issues. GI: OG out since extuabted.  Speech eval today before advaning diet. Renal: AKI post-op progressed  to oliguric renal failure.  Tried aggressive diuresis with high dose lasix and no improvement with worsening respiratory status.  CRRT and pulled off 10 L.  Likely will require iHD, now anuric. Heme: Has chronic thrombocytopenia that was worked up by hematology prior to surgery and previously had trial of steroids with no improvement. .  I now suspect he has a component of cirrhosis given varices on EGD with chronically low platelets elevated liver enzymes and nodular findings on previous liver ultrasound.  Wife now states heavy ETOH abuse prior to surgery. ID: White blood cells improving.  On Vanc cefepime previously, now stopped.  Cultures no growth.  WBC improving and likely stress response. FEN: Remain saline locked.  Appreciate nephrology input.  Extubated.  Likely intermittent HD today.  Speech eval to see if we can advance his diet.  Need to wean the Precedex that was started for agitation.  Following commands, but agitated.  Transfer to floor soon hopefully.  Still has a good PT signal in the left foot.   [name] 08/28/2019 10:59 AM --

## 2019-08-28 NOTE — Progress Notes (Signed)
Subjective:  Patient successfully extubated yesterday. Patient persistently agitated and delirious.   Objective Vital signs in last 24 hours: Vitals:   08/28/19 0832 08/28/19 0842 08/28/19 0845 08/28/19 0900  BP: 120/63 117/63 117/63 (!) 63/43  Pulse:   78   Resp:   17 20  Temp:      TempSrc:      SpO2:   100%   Weight: 84 kg     Height:       Weight change: 0.7 kg  Intake/Output Summary (Last 24 hours) at 08/28/2019 [id number] Last data filed at 08/28/2019 0900 Gross per 24 hour  Intake 223.22 ml  Output 500 ml  Net -276.78 ml    Assessment/ Plan: Pt is a53 y.o.yo malewho was admitted on 10/12/2020for left femoral to PTA bypass graft placement and subsequently developed oliguric renal failure requiring CRRT.  Assessment/Plan: 1.AKI (baseline crt ~1) - multifactorial in the setting of ischemic ATN with ABLA and relative hypotension, contrast nephropathy and BOO. Ultimately required initiation of CRRT on 10/18(temp cath placed)due to profound volume overloadafter failing high doses of Lasix and metolazone.Labs and volume statusgreatly improved as aresult of CRRT which was discontinued 10/21 at 15:30.Intermittent HD treatments on 08/23/2019 for hyperkalemia. Did not tolerate well due to hypotension. Treatment was ended early.Placed back on CRRT for persistent hyperkalemiawhich was discontinued again on 10/26. Patient remains anuric. BUN climbing. Potassium and bicarb stable. Crt fairly unchanged. Will proceed with IHD today.   2. Acute hypoxic respiratory failure - volume overload vs multifocal pneumonia. Reintubated on Fridayfor airway protection in the setting ofnasopharyngeal bleed. Patient successfully extubated on 10/28.   3. ABLA -transfuse as needed to keep hgb > 7. ContinueESA.  4. HTN -blood pressure stable and should do ok on IHD. Will continue to monitor.  Delice Bison    Labs: Basic Metabolic Panel: Recent Labs  Lab 08/26/19 0500  08/27/19 0351 08/28/19 0316  NA 136 139 141  K 3.5 3.4* 3.7  CL 104 108 110  CO2 22 21* 21*  GLUCOSE 141* 192* 172*  BUN 66* 87* 97*  CREATININE 4.80* 6.36* 5.19*  CALCIUM 7.5* 8.0* 8.2*  PHOS 4.9* 4.4 6.0*   Liver Function Tests: Recent Labs  Lab 08/25/19 0807  08/26/19 0500 08/27/19 0351 08/28/19 0316  AST 554*  --  411*  --  166*  ALT 155*  --  124*  --  83*  ALKPHOS 101  --  84  --  92  BILITOT 4.2*  --  2.8*  --  2.4*  PROT 7.7  --  6.9  --  7.6  ALBUMIN 1.8*   < > 1.6* 1.7* 1.8*   < > = values in this interval not displayed.   No results for input(s): LIPASE, AMYLASE in the last 168 hours. Recent Labs  Lab 08/22/19 0431 08/25/19 0807 08/26/19 0816  AMMONIA 60* 59* 55*   CBC: Recent Labs  Lab 08/24/19 0758 08/25/19 0551 08/25/19 0807 08/26/19 0500 08/27/19 0351  WBC 36.7* 25.5* 23.8* 19.0* 18.9*  NEUTROABS 28.3* 19.3*  --  12.2*  --   HGB 7.7* 8.1* 8.2* 7.8* 7.6*  HCT 24.3* 26.1* 25.8* 24.5* 24.4*  MCV 89.7 90.6 89.3 90.7 93.5  PLT 61* 50* 45* 45* 46*   Cardiac Enzymes: No results for input(s): CKTOTAL, CKMB, CKMBINDEX, TROPONINI in the last 168 hours. CBG: Recent Labs  Lab 08/27/19 1558 08/27/19 1948 08/27/19 2345 08/28/19 0319 08/28/19 0750  GLUCAP 207* 193* 160* 160* 146*  Iron Studies: No results for input(s): IRON, TIBC, TRANSFERRIN, FERRITIN in the last 72 hours. Studies/Results: Dg Chest Port 1 View  Result Date: 08/28/2019 CLINICAL DATA:  Acute respiratory failure EXAM: PORTABLE CHEST 1 VIEW COMPARISON:  August 24, 2019 FINDINGS: The mediastinal contour and cardiac silhouette are stable. Right central venous line is identified distal tip in the superior vena cava, unchanged. There is no focal infiltrate, pulmonary edema, or pleural effusion. Bony structures are stable. IMPRESSION: No acute cardiopulmonary disease. Electronically Signed   By: Abelardo Diesel M.D.   On: 08/28/2019 07:21   Medications: Infusions: . sodium chloride  Stopped (08/22/19 1311)  . sodium chloride 35 mL (08/22/19 2239)  . dexmedetomidine (PRECEDEX) IV infusion 0.4 mcg/kg/hr (08/28/19 0800)  . feeding supplement (VITAL AF 1.2 CAL) Stopped (08/27/19 1100)  . magnesium sulfate bolus IVPB      Scheduled Medications: . sodium chloride   Intravenous Once  . sodium chloride   Intravenous Once  . B-complex with vitamin C  1 tablet Per Tube Daily  . Chlorhexidine Gluconate Cloth  6 each Topical Daily  . Chlorhexidine Gluconate Cloth  6 each Topical Q0600  . cyanocobalamin  1,000 mcg Intramuscular Daily  . darbepoetin (ARANESP) injection - NON-DIALYSIS  150 mcg Subcutaneous Q Mon-1800  . feeding supplement (PRO-STAT SUGAR FREE 64)  30 mL Per Tube BID  . folic acid  1 mg Intravenous Daily  . heparin      . insulin aspart  0-9 Units Subcutaneous Q4H  . insulin glargine  5 Units Subcutaneous QPM  . lactulose  30 g Per Tube TID  . pantoprazole (PROTONIX) IV  40 mg Intravenous Q24H  . sodium chloride flush  10-40 mL Intracatheter Q12H  . sodium chloride flush  3 mL Intravenous Q12H  . thiamine  100 mg Oral Daily   Or  . thiamine  100 mg Intravenous Daily  . [START ON 08/31/2019] vitamin B-12  1,000 mcg Oral Daily    have reviewed scheduled and prn medications.  Physical Exam: General: chronically ill gentleman, lying in bed in soft restraints, agitated  HEENT: moist mucous membranes  CV: RRR  Lungs:CTAB Abdomen: soft, non-distended, non-tender  Extremities: LLE with 1+ pitting edema; trace edema on RLE Neuro: awake, agitated, repeatedly asking to remove restraints    08/28/2019,9:52 AM  LOS: 17 days

## 2019-08-29 DIAGNOSIS — G9340 Encephalopathy, unspecified: Secondary | ICD-10-CM | POA: Diagnosis not present

## 2019-08-29 LAB — COMPREHENSIVE METABOLIC PANEL
ALT: 65 U/L — ABNORMAL HIGH (ref 0–44)
AST: 113 U/L — ABNORMAL HIGH (ref 15–41)
Albumin: 1.7 g/dL — ABNORMAL LOW (ref 3.5–5.0)
Alkaline Phosphatase: 94 U/L (ref 38–126)
Anion gap: 12 (ref 5–15)
BUN: 65 mg/dL — ABNORMAL HIGH (ref 6–20)
CO2: 22 mmol/L (ref 22–32)
Calcium: 7.9 mg/dL — ABNORMAL LOW (ref 8.9–10.3)
Chloride: 103 mmol/L (ref 98–111)
Creatinine, Ser: 4.53 mg/dL — ABNORMAL HIGH (ref 0.61–1.24)
GFR calc Af Amer: 16 mL/min — ABNORMAL LOW (ref >60)
GFR calc non Af Amer: 14 mL/min — ABNORMAL LOW (ref >60)
Glucose, Bld: 193 mg/dL — ABNORMAL HIGH (ref 70–99)
Potassium: 3.5 mmol/L (ref 3.5–5.1)
Sodium: 137 mmol/L (ref 135–145)
Total Bilirubin: 2.2 mg/dL — ABNORMAL HIGH (ref 0.3–1.2)
Total Protein: 7.3 g/dL (ref 6.5–8.1)

## 2019-08-29 LAB — CBC
HCT: 27.5 % — ABNORMAL LOW (ref 39.0–52.0)
Hemoglobin: 8.3 g/dL — ABNORMAL LOW (ref 13.0–17.0)
MCH: 29.2 pg (ref 26.0–34.0)
MCHC: 30.2 g/dL (ref 30.0–36.0)
MCV: 96.8 fL (ref 80.0–100.0)
Platelets: 45 10*3/uL — ABNORMAL LOW (ref 150–400)
RBC: 2.84 MIL/uL — ABNORMAL LOW (ref 4.22–5.81)
RDW: 21.9 % — ABNORMAL HIGH (ref 11.5–15.5)
WBC: 24.2 10*3/uL — ABNORMAL HIGH (ref 4.0–10.5)
nRBC: 0.4 % — ABNORMAL HIGH (ref 0.0–0.2)

## 2019-08-29 LAB — PHOSPHORUS: Phosphorus: 6.5 mg/dL — ABNORMAL HIGH (ref 2.5–4.6)

## 2019-08-29 LAB — MAGNESIUM: Magnesium: 2.7 mg/dL — ABNORMAL HIGH (ref 1.7–2.4)

## 2019-08-29 LAB — GLUCOSE, CAPILLARY
Glucose-Capillary: 164 mg/dL — ABNORMAL HIGH (ref 70–99)
Glucose-Capillary: 192 mg/dL — ABNORMAL HIGH (ref 70–99)
Glucose-Capillary: 192 mg/dL — ABNORMAL HIGH (ref 70–99)
Glucose-Capillary: 225 mg/dL — ABNORMAL HIGH (ref 70–99)

## 2019-08-29 LAB — AMMONIA: Ammonia: 67 umol/L — ABNORMAL HIGH (ref 9–35)

## 2019-08-29 MED ORDER — CHLORHEXIDINE GLUCONATE CLOTH 2 % EX PADS
6.0000 | MEDICATED_PAD | Freq: Every day | CUTANEOUS | Status: DC
  Administered 2019-08-31 – 2019-09-03 (×4): 6 via TOPICAL

## 2019-08-29 MED ORDER — LACTULOSE ENCEPHALOPATHY 10 GM/15ML PO SOLN
30.0000 g | Freq: Three times a day (TID) | ORAL | Status: DC
  Administered 2019-08-29: 19:00:00 30 g via ORAL
  Filled 2019-08-29 (×3): qty 45

## 2019-08-29 MED ORDER — RESOURCE THICKENUP CLEAR PO POWD
ORAL | Status: DC | PRN
  Filled 2019-08-29: qty 125

## 2019-08-29 MED ORDER — LACTULOSE 10 GM/15ML PO SOLN: 30.0000 g | Freq: Three times a day (TID) | ORAL | Status: DC

## 2019-08-29 MED ORDER — LACTULOSE 10 GM/15ML PO SOLN
30.0000 g | Freq: Three times a day (TID) | ORAL | Status: DC
  Administered 2019-08-30 – 2019-09-03 (×15): 30 g via ORAL
  Filled 2019-08-29 (×14): qty 45

## 2019-08-29 NOTE — Progress Notes (Signed)
[facility]  Subjective  - Remains agitated after extubation.  States his left foot feels fine.    Objective 137/67 77 97.6 F (36.4 C) (Axillary) 16 100%  Intake/Output Summary (Last 24 hours) at 08/29/2019 1048 Last data filed at 08/29/2019 0800 Gross per 24 hour  Intake 143.85 ml  Output -120 ml  Net 263.85 ml   Extubated, moving all extremities, agitated. Left PT/Dp signal now more monophasic Left groin incision c/d/i - small amount fat necrosis superiorly - no significant drainage - no cellulitis Left leg incisions c/d/i - some serous drainage proximally at vein harvest site Abdomen soft  Laboratory Lab Results: Recent Labs    08/27/19 0351 08/29/19 0411  WBC 18.9* 24.2*  HGB 7.6* 8.3*  HCT 24.4* 27.5*  PLT 46* 45*   BMET Recent Labs    08/28/19 0316 08/29/19 0411  NA 141 137  K 3.7 3.5  CL 110 103  CO2 21* 22  GLUCOSE 172* 193*  BUN 97* 65*  CREATININE 5.19* 4.53*  CALCIUM 8.2* 7.9*    COAG Lab Results  Component Value Date   INR 1.7 (H) 08/27/2019   INR 1.4 (H) 08/22/2019   INR 1.5 (H) 08/22/2019   No results found for: PTT  Assessment/Planning:  Postop day 18 status post left common femoral to PT bypass with composite PTFE and GSV.   Neuro: Now extubated (previously intubated for airway protection after bleeding from his nasopharynx Friday).  CT head negative for any acute abnormality.  EEG to r/o seizure - no seizure - encephalopathy.  Appreciate neurology input.  Significant mental issues postop.  Initially raised the question of withdrawal.  He is on CIWA protocol now.  Also probably has a component of hepatic encephalopathy given evidence of cirrhosis.  Continue lactulose.  Ammonia still elevated. [id number] post-op.  Hgb 8.3 today, no indication for transfusion.  Platelets 45 - not bleeding - no immediate indication for transfusion.. Pulm: Extubated, room air, no respiratory issues. GI: OG out since  extuabted.  Speech eval again today and ok for dyshagia 1 diet - will advance.   Renal: AKI post-op progressed to oliguric renal failure.  Tried aggressive diuresis with high dose lasix and no improvement with worsening respiratory status.  CRRT and pulled off 10 L.  Likely will require iHD, now anuric.Nephrology following, next dialysis tomorrow. Heme: Has chronic thrombocytopenia that was worked up by hematology prior to surgery and previously had trial of steroids with no improvement. .  I now suspect he has a component of cirrhosis given varices on EGD with chronically low platelets elevated liver enzymes and nodular findings on previous liver ultrasound.  Wife now states heavy ETOH abuse prior to surgery. ID: WBC count had been improving, 18-->24 today, off antibiotics.  Afebrile.  Will monitor. FEN: Remain saline locked.  Appreciate nephrology input.  Extubated.  Remains agitated ICU.  Speech evaluated again today and okay for dysphagia 1 diet and will advance.  Continue lactulose for his encephalopathy.  Keeping a dry dressing in the left groin where he has a little bit of fat necrosis superiorly with no other drainage.  He has left PT and DP signal.  Can change dressings to the left lower extremity saphenectomy site.  Hopefully transfer to the floor today.  Appreciate nephrology following and next dialysis tomorrow.  [name] 08/29/2019 10:48 AM --

## 2019-08-29 NOTE — Progress Notes (Signed)
[facility] ROUNDING NOTE   Subjective:   Is a 53 year old gentleman with a history of diabetes hypertension hyperlipidemia thrombocytopenia and peripheral artery disease.  08/11/2019 underwent PTA bypass graft placement left femoral.  08/22/2019 he required emergent intubation in the setting of vomiting and bright red blood emesis with decreased mental status.  Admitted 08/11/2019 left femoral to PTA by graft graft placement subsequently developed oliguric renal failure requiring CRRT this was started 10/18 until 10/21.  He has been on intermittent hemodialysis treatment since 08/23/2019.  He underwent successful dialysis on 08/28/2019 it looks like there was a net ultrafiltration of 600 cc.Marland Kitchen  He is confused this morning.  Blood pressure 136/70 pulse 81 temperature 97.6 O2 sats 100%  Sodium 137 potassium 3.5 chloride 103 CO2 22 BUN 65 creatinine 4.53 glucose 193 phosphorus 6.5 magnesium 2.7 albumin 1.7 AST 113 ALT 65 ammonia 67 WBC 24.2 hemoglobin 8.3 platelets 45  IV Protonix 40 mg every 24 hours     Objective:  Vital signs in last 24 hours:  Temp:  [97.5 F (36.4 C)-98.4 F (36.9 C)] 97.6 F (36.4 C) (10/30 0400) Pulse Rate:  [66-105] 66 (10/30 0600) Resp:  [13-33] 18 (10/30 0700) BP: (78-152)/(54-77) 141/61 (10/30 0700) SpO2:  [92 %-100 %] 92 % (10/30 0600) Weight:  [84.2 kg-84.5 kg] 84.2 kg (10/30 0424)  Weight change: 0.6 kg Filed Weights   08/28/19 0832 08/28/19 1212 08/29/19 0424  Weight: 84 kg 84.5 kg 84.2 kg    Intake/Output: I/O last 3 completed shifts: In: 289.8 [I.V.:289.8] Out: -520 [Stool:80]   Intake/Output this shift:  No intake/output data recorded.  General: chronically ill gentleman, lying in bed in soft restraints, agitated  HEENT: moist mucous membranes  CV: RRR  Lungs:CTAB Abdomen: soft, non-distended, non-tender  Extremities: LLE with no edema  Neuro: awake, agitated, repeatedly asking to remove restraints    Basic Metabolic  Panel: Recent Labs  Lab 08/25/19 0807 08/25/19 1606 08/26/19 0500 08/27/19 0351 08/28/19 0316 08/29/19 0411  NA 136 139 136 139 141 137  K 3.8 3.6 3.5 3.4* 3.7 3.5  CL 102 106 104 108 110 103  CO2 24 23 22  21* 21* 22  GLUCOSE 159* 131* 141* 192* 172* 193*  BUN 65* 51* 66* 87* 97* 65*  CREATININE 3.96* 3.37* 4.80* 6.36* 5.19* 4.53*  CALCIUM 8.1* 7.7* 7.5* 8.0* 8.2* 7.9*  MG 2.9*  --  2.8* 3.2* 3.0* 2.7*  PHOS 5.3* 5.4* 4.9* 4.4 6.0* 6.5*    Liver Function Tests: Recent Labs  Lab 08/25/19 0551 08/25/19 0807 08/25/19 1606 08/26/19 0500 08/27/19 0351 08/28/19 0316 08/29/19 0411  AST 554* 554*  --  411*  --  166* 113*  ALT 155* 155*  --  124*  --  83* 65*  ALKPHOS 105 101  --  84  --  92 94  BILITOT 4.0* 4.2*  --  2.8*  --  2.4* 2.2*  PROT 8.1 7.7  --  6.9  --  7.6 7.3  ALBUMIN 1.9* 1.8* 1.7* 1.6* 1.7* 1.8* 1.7*   No results for input(s): LIPASE, AMYLASE in the last 168 hours. Recent Labs  Lab 08/26/19 0816 08/28/19 0942 08/29/19 0411  AMMONIA 55* 91* 67*    CBC: Recent Labs  Lab 08/22/19 1254  08/24/19 0758 08/25/19 0551 08/25/19 0807 08/26/19 0500 08/27/19 0351 08/29/19 0411  WBC 28.0*   < > 36.7* 25.5* 23.8* 19.0* 18.9* 24.2*  NEUTROABS 21.7*  --  28.3* 19.3*  --  12.2*  --   --  HGB 7.9*   < > 7.7* 8.1* 8.2* 7.8* 7.6* 8.3*  HCT 25.8*   < > 24.3* 26.1* 25.8* 24.5* 24.4* 27.5*  MCV 89.0   < > 89.7 90.6 89.3 90.7 93.5 96.8  PLT 79*   < > 61* 50* 45* 45* 46* 45*   < > = values in this interval not displayed.    Cardiac Enzymes: No results for input(s): CKTOTAL, CKMB, CKMBINDEX, TROPONINI in the last 168 hours.  BNP: Invalid input(s): POCBNP  CBG: Recent Labs  Lab 08/28/19 1552 08/28/19 1957 08/29/19 0018 08/29/19 0416 08/29/19 0851  GLUCAP 148* 214* 192* 164* 192*    Microbiology: Results for orders placed or performed during the hospital encounter of 08/11/19  Culture, blood (routine x 2)     Status: None   Collection Time: 08/17/19  10:23 PM   Specimen: BLOOD RIGHT HAND  Result Value Ref Range Status   Specimen Description BLOOD RIGHT HAND  Final   Special Requests   Final    BOTTLES DRAWN AEROBIC AND ANAEROBIC Blood Culture results may not be optimal due to an inadequate volume of blood received in culture bottles   Culture   Final    NO GROWTH 5 DAYS Performed at [facility], [place] [address], [place], [place] [address]    Report Status 08/22/2019 FINAL  Final  Culture, blood (routine x 2)     Status: None   Collection Time: 08/17/19 10:26 PM   Specimen: BLOOD LEFT HAND  Result Value Ref Range Status   Specimen Description BLOOD LEFT HAND  Final   Special Requests   Final    BOTTLES DRAWN AEROBIC AND ANAEROBIC Blood Culture results may not be optimal due to an inadequate volume of blood received in culture bottles   Culture   Final    NO GROWTH 5 DAYS Performed at [facility], [place] [address], [place], [place] [address]    Report Status 08/22/2019 FINAL  Final  Culture, blood (Routine X 2) w Reflex to ID Panel     Status: None (Preliminary result)   Collection Time: 08/22/19  9:53 AM   Specimen: BLOOD LEFT HAND  Result Value Ref Range Status   Specimen Description BLOOD LEFT HAND  Final   Special Requests   Final    BOTTLES DRAWN AEROBIC ONLY Blood Culture adequate volume   Culture  Setup Time   Final    GRAM POSITIVE RODS AEROBIC BOTTLE ONLY CRITICAL RESULT CALLED TO, READ BACK BY AND VERIFIED WITH: PHARMD T BAUMEISTER [id number] AT 1056 AM BY CM    Culture   Final    CULTURE REINCUBATED FOR BETTER GROWTH Performed at [facility], [place] [address], [place], [place] [address]    Report Status PENDING  Incomplete  Culture, blood (Routine X 2) w Reflex to ID Panel     Status: None   Collection Time: 08/22/19  9:59 AM   Specimen: BLOOD RIGHT HAND  Result Value Ref Range Status   Specimen Description BLOOD RIGHT HAND  Final   Special Requests   Final    BOTTLES DRAWN AEROBIC ONLY Blood  Culture adequate volume   Culture   Final    NO GROWTH 5 DAYS Performed at [facility], [place] [address] [place], [place] [address]    Report Status 08/27/2019 FINAL  Final  Culture, respiratory (non-expectorated)     Status: None   Collection Time: 08/22/19  6:13 PM  Specimen: Tracheal Aspirate; Respiratory  Result Value Ref Range Status   Specimen Description TRACHEAL ASPIRATE  Final   Special Requests Normal  Final   Gram Stain   Final    NO WBC SEEN NO ORGANISMS SEEN Performed at [facility], [address], [place], [place] [address]    Culture RARE CANDIDA ALBICANS  Final   Report Status 08/25/2019 FINAL  Final    Coagulation Studies: Recent Labs    08/27/19 0351  LABPROT 20.1*  INR 1.7*    Urinalysis: No results for input(s): COLORURINE, LABSPEC, PHURINE, GLUCOSEU, HGBUR, BILIRUBINUR, KETONESUR, PROTEINUR, UROBILINOGEN, NITRITE, LEUKOCYTESUR in the last 72 hours.  Invalid input(s): APPERANCEUR    Imaging: Dg Chest Port 1 View  Result Date: 08/28/2019 CLINICAL DATA:  Acute respiratory failure EXAM: PORTABLE CHEST 1 VIEW COMPARISON:  August 24, 2019 FINDINGS: The mediastinal contour and cardiac silhouette are stable. Right central venous line is identified distal tip in the superior vena cava, unchanged. There is no focal infiltrate, pulmonary edema, or pleural effusion. Bony structures are stable. IMPRESSION: No acute cardiopulmonary disease. Electronically Signed   By: Abelardo Diesel M.D.   On: 08/28/2019 07:21     Medications:   . sodium chloride Stopped (08/22/19 1311)  . sodium chloride 35 mL (08/22/19 2239)  . dexmedetomidine (PRECEDEX) IV infusion 1 mcg/kg/hr (08/29/19 0200)  . feeding supplement (VITAL AF 1.2 CAL) Stopped (08/27/19 1100)  . magnesium sulfate bolus IVPB     . sodium chloride   Intravenous Once  . sodium chloride   Intravenous Once  . B-complex with vitamin C  1 tablet Per Tube Daily  . Chlorhexidine Gluconate Cloth  6  each Topical Daily  . Chlorhexidine Gluconate Cloth  6 each Topical Q0600  . cyanocobalamin  1,000 mcg Intramuscular Daily  . darbepoetin (ARANESP) injection - NON-DIALYSIS  150 mcg Subcutaneous Q Mon-1800  . feeding supplement (PRO-STAT SUGAR FREE 64)  30 mL Per Tube BID  . folic acid  1 mg Intravenous Daily  . insulin aspart  0-9 Units Subcutaneous Q4H  . insulin glargine  5 Units Subcutaneous QPM  . lactulose  30 g Per Tube TID  . pantoprazole (PROTONIX) IV  40 mg Intravenous Q24H  . sodium chloride flush  10-40 mL Intracatheter Q12H  . sodium chloride flush  3 mL Intravenous Q12H  . thiamine  100 mg Oral Daily   Or  . thiamine  100 mg Intravenous Daily  . [START ON 08/31/2019] vitamin B-12  1,000 mcg Oral Daily   sodium chloride, alteplase, heparin, influenza vac split quadrivalent PF, labetalol, levalbuterol, lip balm, magnesium sulfate bolus IVPB, phenol, pneumococcal 23 valent vaccine, Resource ThickenUp Clear, sodium chloride flush, sodium chloride flush  Assessment/ Plan:   Acute kidney injury Baseline serum creatinine appears to be 1 etiology appears to be multifactorial including ischemic ATN and acute blood loss with relative hypotension, contrast nephropathy and bladder outlet obstruction.  Requiring intubation and initiation of CRRT 08/17/2019 at that time he had profound fluid overload and had failed high-dose diuretics including Lasix and metolazone.  CRRT was discontinued 08/20/2019 and intermittent dialysis was started.  He had his last dialysis treatment on 08/28/2019 with removal of 600 cc.  We will plan dialysis 08/30/2019  Acute hypoxic respiratory incision volume overload please have resolved in setting of GI bleed.  Anemia required transfusion 1 unit packed red blood cells 08/23/2019 he had received 2 units packed red blood cells 08/11/2019.  And continues on darbepoetin 150  mcg q. Monday.  Acute encephalopathy with ICU delirium CT head scan - 08/24/2019 history of  EtOH abuse.  Is receiving vitamin B multivitamins.  Ammonia level elevated.  Possibly related to underlying liver cirrhosis.  Nasopharyngeal bleed tear with massive hemorrhage 08/22/2019 evaluated seen by ear nose and throat packing removed 08/24/2019  Hyperphosphatemia secondary to end-stage renal disease will need to initiate binders once stable.  Hepatic cirrhosis secondary to EtOH abuse and varices.  Lactulose 30 g 3 times daily  Diabetes insulin sliding scale per primary service   LOS: [place] @TODAY @9 :42 AM

## 2019-08-29 NOTE — Progress Notes (Signed)
Per Dr. Carlis Abbott OK to leave pt off tele since pt is pulling off leads constantly.

## 2019-08-29 NOTE — Progress Notes (Signed)
Nutrition Follow-up  DOCUMENTATION CODES:   Not applicable  INTERVENTION:   - Vital Cuisine Shake TID, each supplement provides 520 kcal and 22 grams of protein  - Magic cup TID with meals, each supplement provides 290 kcal and 9 grams of protein  - d/c tube feeding orders  - d/c B-complex with vitamin C  NUTRITION DIAGNOSIS:   Increased nutrient needs related to acute illness (AKI requiring CRRT) as evidenced by estimated needs.  Ongoing, being addressed via oral nutrition supplements  GOAL:   Patient will meet greater than or equal to 90% of their needs  Progressing  MONITOR:   PO intake, Supplement acceptance, Diet advancement, Labs, Weight trends, Skin, I & O's  REASON FOR ASSESSMENT:   Rounds    ASSESSMENT:   Patient with PMH significant for HTN, HLD, DM, and PAD s/p bilateral common iliac stenting on 4/9 . Presents this admission with continued leg leg pain.  10/12 - s/p L femoral PTA bypass 10/18 - R IJ, CRRT initiated 10/21 - CRRT stopped 10/23 - gastric Cortrak placed, re-intubated  10/24 - poor toleration iHD, placed back on CRRT 10/26 - CRRT stopped 10/28 - extubated 10/29 - iHD 10/30 - diet advanced to DYS 1 with nectar-thick liquids  Discussed pt with RN and during ICU rounds. Pt yelling loudly during rounds so RD did not attempt to speak with pt at this time. Det advanced to Dysphagia 1 with nectar-thick liquids. Next HD planned for 10/31.  RD will order appropriate oral nutrition supplements to aid pt in meeting kcal and protein needs.  Weight down a total of 44 lbs since 10/16. Suspect some of weight loss is related to fluid status. Will continue to monitor trends.  Pt with non-pitting edema to BUE and mild to moderate pitting edema to BLE.  Medications reviewed and include: B-complex with vitamin C, vitamin 0000000, Aranesp, folic acid, SSI q 4 hours, Lantus 5 units daily, lactulose, Protonix, thiamine  Labs reviewed: phosphorus 6.5,  magnesium 2.7, elevated LFTs, hemoglobin 8.3  I/O's: -2.0 L since admit  Diet Order:   Diet Order            DIET - DYS 1 Room service appropriate? Yes; Fluid consistency: Nectar Thick  Diet effective now              EDUCATION NEEDS:   Not appropriate for education at this time  Skin:  Skin Assessment: Skin Integrity Issues: Skin Integrity Issues: Stage II: rectum Incisions: left leg  Last BM:  08/29/19 rectal tube  Height:   Ht Readings from Last 1 Encounters:  08/11/19 6\' 1"  (1.854 m)    Weight:   Wt Readings from Last 1 Encounters:  08/29/19 84.2 kg    Ideal Body Weight:  83.6 kg  BMI:  Body mass index is 24.49 kg/m.  Estimated Nutritional Needs:   Kcal:  2300-2500  Protein:  115-130 grams  Fluid:  UOP + 1000 ml    Gaynell Face, MS, RD, LDN Inpatient Clinical Dietitian Pager: [phone] Weekend/After Hours: [phone]

## 2019-08-29 NOTE — Progress Notes (Signed)
  Speech Language Pathology Treatment: Dysphagia  Patient Details Name: Edward Rocha. MRN: [id number] DOB: 1966/09/20 Today's Date: 08/29/2019 Time: [id number] SLP Time Calculation (min) (ACUTE ONLY): 14 min  Assessment / Plan / Recommendation Clinical Impression  Pt's mentation is grossly unchanged, although suspect mild resolution of post-extubation related dysphagia symptoms. SLP provided Mod cues, mostly verbal/tactile as he closes his eyes for a lot of this session, to not talk with his mouth full. He coughed x1 with puree when he started screaming with a bite in his mouth. Although concerning for premature spillage, this would also be an appropriate reaction if something were to spill into his airway. Pt has multiple swallows with thin liquids only and cannot drink three ounces of water consecutively without coughing. He has single swallows with nectar thick liquids and purees and no further signs concerning for aspiration when challenged. Recommend starting Dys 1 diet and nectar thick liquids with strict supervision during PO intake in light of his mentation. Will f/u for tolerance and the potential to advance. Although he may be able to advance liquids with further resolution of post-extubation changes, suspect that we will need to see improved mentation before he will be ready for more solid textures.    HPI HPI: Pt is a 53 yo who presented on 10/12 for L common femoral to posterior tibial composite bypass with PTFE and saphenous vein. Hospital course was complicated by AKI with CRRT started 10/18, acute respiratory failure, and AMS. Pt required ETT 10/23-10/28. PMH includes: DM, HTN, HLD, thrombocytpenia, and PAD s/p bilateral common iliac stenting on 4/9      SLP Plan  Continue with current plan of care       Recommendations  Diet recommendations: Dysphagia 1 (puree);Nectar-thick liquid Liquids provided via: Straw Medication Administration: Crushed with puree Supervision: Staff  to assist with self feeding;Full supervision/cueing for compensatory strategies Compensations: Minimize environmental distractions;Slow rate;Small sips/bites;Other (Comment)(cue pt to not talk with his mouth full) Postural Changes and/or Swallow Maneuvers: Seated upright 90 degrees                Oral Care Recommendations: Oral care BID Follow up Recommendations: Skilled Nursing facility SLP Visit Diagnosis: Dysphagia, oropharyngeal phase (R13.12) Plan: Continue with current plan of care       Edward Rocha 08/29/2019, 9:27 AM  Edward Rocha, M.A. Edward Rocha Acute Environmental education officer [phone] Office [phone]

## 2019-08-29 NOTE — Progress Notes (Signed)
NAME:  Edward Rocha., MRN:  [id number], DOB:  1966/09/29, LOS: 47 ADMISSION DATE:  08/11/2019, CONSULTATION DATE:  08/17/19, CHIEF COMPLAINT:  SOB  Brief History    53 yo with history of DM, HTN, HLD, thrombocytopenia, and PAD s/p aortogram with left iliac arteriogram prior to left femoral to PTA bypass graft placement on 08/11/19.  Developed oliguric renal failure, CCM consulted 10/18 due to worsening hypoxia (volume overload vs pneumonia).  Pt was treated with CVVHD x 2 days 10/19-10/21. PCCM had signed off on 10/21. Patient has a new diagnosis during this admission of cirrhosis secondary to EtOH abuse.  10/23 PCCM were re consulted for emergent intubation in setting of vomiting BRB emesis of 600 cc's decreased mental status and concern for airway protection. Pt was emergently intubated.  Past Medical History  DM HTN HLD Thrombocytopenia PAD AKI Jehovah Witness   Significant Hospital Events   08/11/19-admit for vascular procedure.  CCM on board from 10/18-10/21 for respiratory failure, renal failure requiring hemodialysis  10/21 off CRRT  10/23 - Re-consulted for emergent intubation in setting of bloody emesis and inability to protect airway in setting of massive GI v ENT hemorrhag v both GI scopy - s/p banding of 2 non-bleeding varices. Bleeding not thought to be from esophagus or stomatch ENT cx > underwent nasal packing. After this maneuver there was no further bleeding identified Wife - Edward Rocha witness - does not want PRBC unless we have to  10/24 iHD for hyperkalemia- did not tolerate due to hypotension and ended early and switched back to CRRT  10/25- nasal balloon removed by ENT with no evidence of ongoing bleed.  OG tube placed by IR  10/26 - No acute events.  Mental status continues to be poor Continues on vent, CRRT stopped   10/29 receiving iHD. Remains delirious   Consults:  PCCM Nephro Surgery ENT  Procedures:  [id number] HD cath 10/18 >> 10/23 ETT >> 10/25 -  flexiseal following lactulse enema  Significant Diagnostic Tests:  10/14  renal ultrasound >> no hnosis  10/25 CT head-no acute intracranial abnormality 10 /26 EEG- severe diffuse encephalopathy, likely secondary to toxic-metabolic etiology. Multiple events of patient biting on ET tube were captured without any EEG change and therefore were non epileptic  Micro Data:  Covid negative (pta) 10/18 BCx2  >>neg 10/23 BCx 2 >> 1/4 GPCs 10/23 trach asp >> neg  Antimicrobials:  Cefepime 10/17 >> 10/23 Vanc 10/23   Interim history/subjective:  Off of precedex Remains extubated, HDS Remains agitated  Transfer orders out of ICU have been placed by primary team   Objective   Blood pressure 137/67, pulse 77, temperature 97.6 F (36.4 C), temperature source Axillary, resp. rate 16, height 6\' 1"  (1.854 m), weight 84.2 kg, SpO2 100 %.        Intake/Output Summary (Last 24 hours) at 08/29/2019 1055 Last data filed at 08/29/2019 0800 Gross per 24 hour  Intake 143.85 ml  Output -120 ml  Net 263.85 ml   Filed Weights   08/28/19 0832 08/28/19 1212 08/29/19 0424  Weight: 84 kg 84.5 kg 84.2 kg   Gen:      Chronically ill appearing adult M. Appears older than stated age. Agitated but NAD HEENT:  NCAT. Pink mmm. Trachea midline  Lungs:   CTA bilaterally, symmetrical chest expansion, no accessory muscle use CV:         RRR s12s no rgm Abd:      Soft, round, ndnt. + bowel sounds  Ext:    LLE 2+ pitting edema, LLEsurgical incision c/d/i well approximated.   Skin:      C/d/w without rash Neuro:   Awake, alert, disoriented and agitated   Resolved Hospital Problem list   Acute hypoxic respiratory failure   Assessment & Plan:   Acute Encephalopathy  -suspect hyperactive ICU delirium - CTH neg 10/25 - wife reported pt  is a heavy drinker ( drinks two 40's per day). Out of window for EtOH dt at this juncture  - component of hepatic encephalopathy  -Initial c/f seizures however this  evaluation has been neg  -off precedex 10/30 P:  Neuro has evaluated, no AED indicated Minimize sedation; off precedex  Continue lactulose for hepatic encephalopathy Continue B vitamins Delirium precautions Possible role for qHS antipsycotics however will defer at this time  Nasopharyngeal bleed, improved  - likely from tear with massive hemorrhage 10/23 P:  ENT seen 10/25 and removed nasal packing.  Per ENT, defer NGt placement   AKI requiring RRT -suspect ATN - baseline sCr ~1  P:  Appreciate nephrology management of iHD  Thrombocytopenia -likely secondary to liver disease Anemia of critical illness, blood loss - Jehovah Witness  -S/p Iron infusion (10/25 and 10/26) w/ B12 P:  -trend CBC PRN. Attempt to minimize lab draws as able    Hepatic cirrhosis  - likely 2/2 ETOH abuse with varices Hyperammoniemia  P: lactulose 30 g TID  Trend ammonia   IDDM 10/12  8.5 A1c  P:  Lantus + SSI  Leukocytosis  Has had fluctuating degree of leukocytosis this hospital course without source Possibly reactive in setting of critical illness No fever P Trend WBC, fever, if new fever consider repeat blood cultures.   Deconditioning P PT  Best practice:  Diet: Dysphagia 1 Pain/Anxiety/Delirium protocol (if indicated): na VAP protocol (if indicated): na DVT prophylaxis: SCD GI prophylaxis: Protonix Mobility: BR Code Status: Full  Family Communication: Wife Edward Rocha [phone]-- unable to reach 10/31 Disposition: Off of precedex, HDS, tranfer orders placed by primary to 4E. PCCM will sign off; confirmed with Dr. Joseph Art MSN, AGACNP-BC [place] [id number] If no answer, [id number] 08/29/2019, 11:27 AM

## 2019-08-30 LAB — GLUCOSE, CAPILLARY
Glucose-Capillary: 112 mg/dL — ABNORMAL HIGH (ref 70–99)
Glucose-Capillary: 200 mg/dL — ABNORMAL HIGH (ref 70–99)
Glucose-Capillary: 208 mg/dL — ABNORMAL HIGH (ref 70–99)
Glucose-Capillary: 218 mg/dL — ABNORMAL HIGH (ref 70–99)
Glucose-Capillary: 222 mg/dL — ABNORMAL HIGH (ref 70–99)

## 2019-08-30 LAB — RENAL FUNCTION PANEL
Albumin: 1.7 g/dL — ABNORMAL LOW (ref 3.5–5.0)
Anion gap: 16 — ABNORMAL HIGH (ref 5–15)
BUN: 77 mg/dL — ABNORMAL HIGH (ref 6–20)
CO2: 18 mmol/L — ABNORMAL LOW (ref 22–32)
Calcium: 8 mg/dL — ABNORMAL LOW (ref 8.9–10.3)
Chloride: 97 mmol/L — ABNORMAL LOW (ref 98–111)
Creatinine, Ser: 3.86 mg/dL — ABNORMAL HIGH (ref 0.61–1.24)
GFR calc Af Amer: 19 mL/min — ABNORMAL LOW (ref >60)
GFR calc non Af Amer: 16 mL/min — ABNORMAL LOW (ref >60)
Glucose, Bld: 201 mg/dL — ABNORMAL HIGH (ref 70–99)
Phosphorus: 5.6 mg/dL — ABNORMAL HIGH (ref 2.5–4.6)
Potassium: 3.2 mmol/L — ABNORMAL LOW (ref 3.5–5.1)
Sodium: 131 mmol/L — ABNORMAL LOW (ref 135–145)

## 2019-08-30 LAB — CBC
HCT: 24.7 % — ABNORMAL LOW (ref 39.0–52.0)
Hemoglobin: 7.6 g/dL — ABNORMAL LOW (ref 13.0–17.0)
MCH: 29 pg (ref 26.0–34.0)
MCHC: 30.8 g/dL (ref 30.0–36.0)
MCV: 94.3 fL (ref 80.0–100.0)
Platelets: 46 10*3/uL — ABNORMAL LOW (ref 150–400)
RBC: 2.62 MIL/uL — ABNORMAL LOW (ref 4.22–5.81)
RDW: 21.9 % — ABNORMAL HIGH (ref 11.5–15.5)
WBC: 14.7 10*3/uL — ABNORMAL HIGH (ref 4.0–10.5)
nRBC: 0.4 % — ABNORMAL HIGH (ref 0.0–0.2)

## 2019-08-30 LAB — AMMONIA: Ammonia: 60 umol/L — ABNORMAL HIGH (ref 9–35)

## 2019-08-30 LAB — MAGNESIUM: Magnesium: 2.1 mg/dL (ref 1.7–2.4)

## 2019-08-30 MED ORDER — POTASSIUM CHLORIDE CRYS ER 20 MEQ PO TBCR
20.0000 meq | EXTENDED_RELEASE_TABLET | Freq: Once | ORAL | Status: AC
  Administered 2019-08-30: 20 meq via ORAL
  Filled 2019-08-30: qty 1

## 2019-08-30 MED ORDER — HEPARIN SODIUM (PORCINE) 1000 UNIT/ML IJ SOLN
INTRAMUSCULAR | Status: AC
  Administered 2019-08-30: 1000 [IU] via INTRAVENOUS_CENTRAL
  Filled 2019-08-30: qty 4

## 2019-08-30 MED ORDER — VITAMIN B-12 1000 MCG PO TABS
1000.0000 ug | ORAL_TABLET | Freq: Every day | ORAL | Status: DC
  Administered 2019-08-30 – 2019-09-10 (×11): 1000 ug via ORAL
  Filled 2019-08-30 (×11): qty 1

## 2019-08-30 NOTE — Progress Notes (Signed)
   VASCULAR SURGERY ASSESSMENT & PLAN:   S/P Iliac stenting and a left femoral to posterior tibial bypass: The left foot is warm and well-perfused.   LEUKOCYTOSIS: His white blood cell count is coming down.  No further nosebleeds  SUBJECTIVE:   Currently on hemodialysis.  No complaints.  PHYSICAL EXAM:   Vitals:   08/30/19 0730 08/30/19 0800 08/30/19 0830 08/30/19 0900  BP: (!) 175/58 (!) 171/57 (!) 196/61 (!) 157/76  Pulse: (!) 128 (!) 131 (!) 129 (!) 129  Resp:      Temp:      TempSrc:      SpO2:      Weight:      Height:       All of his incisions in the left leg look fine. Left foot is warm and well-perfused. His dialysis catheter is working fine.  LABS:   Lab Results  Component Value Date   WBC 14.7 (H) 08/30/2019   HGB 7.6 (L) 08/30/2019   HCT 24.7 (L) 08/30/2019   MCV 94.3 08/30/2019   PLT 46 (L) 08/30/2019   Lab Results  Component Value Date   CREATININE 3.86 (H) 08/30/2019   CBG (last 3)  Recent Labs    08/29/19 2022 08/30/19 0019 08/30/19 0438  GLUCAP 225* 222* 218*    PROBLEM LIST:    Active Problems:   PAD (peripheral artery disease) (HCC)   Acute respiratory failure (HCC)   Aspiration into airway   Acute encephalopathy   Gastrointestinal hemorrhage   Endotracheal tube present   Nasogastric tube present   Pressure injury of skin   CURRENT MEDS:   . sodium chloride   Intravenous Once  . sodium chloride   Intravenous Once  . Chlorhexidine Gluconate Cloth  6 each Topical Daily  . Chlorhexidine Gluconate Cloth  6 each Topical Q0600  . Chlorhexidine Gluconate Cloth  6 each Topical Q0600  . cyanocobalamin  1,000 mcg Intramuscular Daily  . darbepoetin (ARANESP) injection - NON-DIALYSIS  150 mcg Subcutaneous Q Mon-1800  . folic acid  1 mg Intravenous Daily  . insulin aspart  0-9 Units Subcutaneous Q4H  . insulin glargine  5 Units Subcutaneous QPM  . lactulose  30 g Oral TID  . pantoprazole (PROTONIX) IV  40 mg Intravenous Q24H  .  sodium chloride flush  10-40 mL Intracatheter Q12H  . sodium chloride flush  3 mL Intravenous Q12H  . thiamine  100 mg Oral Daily   Or  . thiamine  100 mg Intravenous Daily  . [START ON 08/31/2019] vitamin B-12  1,000 mcg Oral Daily    Deitra Mayo Office: [id number] 08/30/2019

## 2019-08-30 NOTE — Progress Notes (Signed)
[facility] ROUNDING NOTE   Subjective:   Is a 53 year old gentleman with a history of diabetes hypertension hyperlipidemia thrombocytopenia and peripheral artery disease.  08/11/2019 underwent PTA bypass graft placement left femoral.  08/22/2019 he required emergent intubation in the setting of vomiting and bright red blood emesis with decreased mental status.  Admitted 08/11/2019 left femoral to PTA by graft graft placement subsequently developed oliguric renal failure requiring CRRT this was started 10/18 until 10/21.  He has been on intermittent hemodialysis treatment since 08/23/2019.  He underwent successful dialysis on 08/28/2019 it looks like there was a net ultrafiltration of 600 cc.Marland Kitchen He was seen on dialysis 08/30/2019 and tolerating well  Blood pressure 157/76 pulse 113 temperature 98.3 O2 sats 100% room air  Sodium 131 potassium 3.2 chloride 97 CO2 18 BUN 77 creatinine 3.86 glucose 201 calcium 8.0 phosphorus 5.6 magnesium 2.1 albumin 1.7 WBC 14.7 hemoglobin 7.6 platelets 46  IV Protonix 40 mg every 24 hours, insulin glargine 5 units subcu daily     Objective:  Vital signs in last 24 hours:  Temp:  [98 F (36.7 C)-98.4 F (36.9 C)] 98.3 F (36.8 C) (10/31 0644) Pulse Rate:  [77-131] 129 (10/31 0900) Resp:  [18-28] 23 (10/31 0644) BP: (127-200)/(57-83) 157/76 (10/31 0900) SpO2:  [98 %-100 %] 100 % (10/31 0644) Weight:  [91.4 kg-91.7 kg] 91.7 kg (10/31 0644)  Weight change: 7.4 kg Filed Weights   08/29/19 0424 08/30/19 0444 08/30/19 0644  Weight: 84.2 kg 91.4 kg 91.7 kg    Intake/Output: I/O last 3 completed shifts: In: 112.5 [I.V.:112.5] Out: 400 [Stool:400]   Intake/Output this shift:  No intake/output data recorded.  General: chronically ill gentleman, lying in bed in soft restraints, agitated  HEENT: moist mucous membranes  CV: RRR  Lungs:CTAB Abdomen: soft, non-distended, non-tender  Extremities: LLE with no edema  Neuro: awake, agitated,  repeatedly asking to remove restraints    Basic Metabolic Panel: Recent Labs  Lab 08/26/19 0500 08/27/19 0351 08/28/19 0316 08/29/19 0411 08/30/19 0528  NA 136 139 141 137 131*  K 3.5 3.4* 3.7 3.5 3.2*  CL 104 108 110 103 97*  CO2 22 21* 21* 22 18*  GLUCOSE 141* 192* 172* 193* 201*  BUN 66* 87* 97* 65* 77*  CREATININE 4.80* 6.36* 5.19* 4.53* 3.86*  CALCIUM 7.5* 8.0* 8.2* 7.9* 8.0*  MG 2.8* 3.2* 3.0* 2.7* 2.1  PHOS 4.9* 4.4 6.0* 6.5* 5.6*    Liver Function Tests: Recent Labs  Lab 08/25/19 0551 08/25/19 0807  08/26/19 0500 08/27/19 0351 08/28/19 0316 08/29/19 0411 08/30/19 0528  AST 554* 554*  --  411*  --  166* 113*  --   ALT 155* 155*  --  124*  --  83* 65*  --   ALKPHOS 105 101  --  84  --  92 94  --   BILITOT 4.0* 4.2*  --  2.8*  --  2.4* 2.2*  --   PROT 8.1 7.7  --  6.9  --  7.6 7.3  --   ALBUMIN 1.9* 1.8*   < > 1.6* 1.7* 1.8* 1.7* 1.7*   < > = values in this interval not displayed.   No results for input(s): LIPASE, AMYLASE in the last 168 hours. Recent Labs  Lab 08/28/19 0942 08/29/19 0411 08/30/19 0528  AMMONIA 91* 67* 60*    CBC: Recent Labs  Lab 08/24/19 0758 08/25/19 0551 08/25/19 0807 08/26/19 0500 08/27/19 0351 08/29/19 0411 08/30/19 0528  WBC 36.7* 25.5* 23.8*  19.0* 18.9* 24.2* 14.7*  NEUTROABS 28.3* 19.3*  --  12.2*  --   --   --   HGB 7.7* 8.1* 8.2* 7.8* 7.6* 8.3* 7.6*  HCT 24.3* 26.1* 25.8* 24.5* 24.4* 27.5* 24.7*  MCV 89.7 90.6 89.3 90.7 93.5 96.8 94.3  PLT 61* 50* 45* 45* 46* 45* 46*    Cardiac Enzymes: No results for input(s): CKTOTAL, CKMB, CKMBINDEX, TROPONINI in the last 168 hours.  BNP: Invalid input(s): POCBNP  CBG: Recent Labs  Lab 08/29/19 0416 08/29/19 0851 08/29/19 2022 08/30/19 0019 08/30/19 0438  GLUCAP 164* 192* 225* 55* 29*    Microbiology: Results for orders placed or performed during the hospital encounter of 08/11/19  Culture, blood (routine x 2)     Status: None   Collection Time: 08/17/19  10:23 PM   Specimen: BLOOD RIGHT HAND  Result Value Ref Range Status   Specimen Description BLOOD RIGHT HAND  Final   Special Requests   Final    BOTTLES DRAWN AEROBIC AND ANAEROBIC Blood Culture results may not be optimal due to an inadequate volume of blood received in culture bottles   Culture   Final    NO GROWTH 5 DAYS Performed at [facility], [place] [address], [place], [place] [address]    Report Status 08/22/2019 FINAL  Final  Culture, blood (routine x 2)     Status: None   Collection Time: 08/17/19 10:26 PM   Specimen: BLOOD LEFT HAND  Result Value Ref Range Status   Specimen Description BLOOD LEFT HAND  Final   Special Requests   Final    BOTTLES DRAWN AEROBIC AND ANAEROBIC Blood Culture results may not be optimal due to an inadequate volume of blood received in culture bottles   Culture   Final    NO GROWTH 5 DAYS Performed at [facility], [place] [address] [place], [place] [address]    Report Status 08/22/2019 FINAL  Final  Culture, blood (Routine X 2) w Reflex to ID Panel     Status: None (Preliminary result)   Collection Time: 08/22/19  9:53 AM   Specimen: BLOOD LEFT HAND  Result Value Ref Range Status   Specimen Description BLOOD LEFT HAND  Final   Special Requests   Final    BOTTLES DRAWN AEROBIC ONLY Blood Culture adequate volume   Culture  Setup Time   Final    GRAM POSITIVE RODS AEROBIC BOTTLE ONLY CRITICAL RESULT CALLED TO, READ BACK BY AND VERIFIED WITH: PHARMD T BAUMEISTER [id number] AT 1056 AM BY CM    Culture   Final    CULTURE REINCUBATED FOR BETTER GROWTH Performed at [facility], [place] [address], [place], [place] [address]    Report Status PENDING  Incomplete  Culture, blood (Routine X 2) w Reflex to ID Panel     Status: None   Collection Time: 08/22/19  9:59 AM   Specimen: BLOOD RIGHT HAND  Result Value Ref Range Status   Specimen Description BLOOD RIGHT HAND  Final   Special Requests   Final    BOTTLES DRAWN AEROBIC ONLY Blood  Culture adequate volume   Culture   Final    NO GROWTH 5 DAYS Performed at [facility], [place] [address], [place],  [address]    Report Status 08/27/2019 FINAL  Final  Culture, respiratory (non-expectorated)     Status: None   Collection Time: 08/22/19  6:13 PM   Specimen: Tracheal Aspirate; Respiratory  Result Value Ref Range Status   Specimen Description TRACHEAL ASPIRATE  Final   Special Requests Normal  Final   Gram Stain   Final    NO WBC SEEN NO ORGANISMS SEEN Performed at [facility], [address], [place], [place] [address]    Culture RARE CANDIDA ALBICANS  Final   Report Status 08/25/2019 FINAL  Final    Coagulation Studies: No results for input(s): LABPROT, INR in the last 72 hours.  Urinalysis: No results for input(s): COLORURINE, LABSPEC, PHURINE, GLUCOSEU, HGBUR, BILIRUBINUR, KETONESUR, PROTEINUR, UROBILINOGEN, NITRITE, LEUKOCYTESUR in the last 72 hours.  Invalid input(s): APPERANCEUR    Imaging: No results found.   Medications:   . sodium chloride Stopped (08/22/19 1311)  . sodium chloride 35 mL (08/22/19 2239)  . magnesium sulfate bolus IVPB     . sodium chloride   Intravenous Once  . sodium chloride   Intravenous Once  . Chlorhexidine Gluconate Cloth  6 each Topical Daily  . Chlorhexidine Gluconate Cloth  6 each Topical Q0600  . Chlorhexidine Gluconate Cloth  6 each Topical Q0600  . cyanocobalamin  1,000 mcg Intramuscular Daily  . darbepoetin (ARANESP) injection - NON-DIALYSIS  150 mcg Subcutaneous Q Mon-1800  . folic acid  1 mg Intravenous Daily  . insulin aspart  0-9 Units Subcutaneous Q4H  . insulin glargine  5 Units Subcutaneous QPM  . lactulose  30 g Oral TID  . pantoprazole (PROTONIX) IV  40 mg Intravenous Q24H  . sodium chloride flush  10-40 mL Intracatheter Q12H  . sodium chloride flush  3 mL Intravenous Q12H  . thiamine  100 mg Oral Daily   Or  . thiamine  100 mg Intravenous Daily  . [START ON 08/31/2019] vitamin  B-12  1,000 mcg Oral Daily   sodium chloride, alteplase, heparin, influenza vac split quadrivalent PF, labetalol, levalbuterol, lip balm, magnesium sulfate bolus IVPB, phenol, pneumococcal 23 valent vaccine, Resource ThickenUp Clear, sodium chloride flush, sodium chloride flush  Assessment/ Plan:   Acute kidney injury Baseline serum creatinine appears to be 1 etiology appears to be multifactorial including ischemic ATN and acute blood loss with relative hypotension, contrast nephropathy and bladder outlet obstruction.  Requiring intubation and initiation of CRRT 08/17/2019 at that time he had profound fluid overload and had failed high-dose diuretics including Lasix and metolazone.  CRRT was discontinued 08/20/2019 and intermittent dialysis was started.  He had his last dialysis treatment on 08/28/2019 with removal of 600 cc.  Patient seen during dialysis treatment 08/30/2019  Acute hypoxic respiratory incision volume overload please have resolved in setting of GI bleed.  Anemia required transfusion 1 unit packed red blood cells 08/23/2019 he had received 2 units packed red blood cells 08/11/2019.  And continues on darbepoetin 150 mcg q. Monday.  Acute encephalopathy with ICU delirium CT head scan - 08/24/2019 history of EtOH abuse.  Is receiving vitamin B multivitamins.  Ammonia level elevated.  Possibly related to underlying liver cirrhosis.  Nasopharyngeal bleed tear with massive hemorrhage 08/22/2019 evaluated seen by ear nose and throat packing removed 08/24/2019  Hyperphosphatemia secondary to end-stage renal disease will need to initiate binders once stable.  Hepatic cirrhosis secondary to EtOH abuse and varices.  Lactulose 30 g 3 times daily  Diabetes insulin sliding scale per primary service   LOS: [place] @TODAY @9 :38 AM

## 2019-08-30 NOTE — Procedures (Signed)
Patient seen and tolerating dialysis today blood pressure [id number] systolic.  Blood flow rate 400 dialysis flow rate 800 tolerating some ultrafiltration will continue to follow

## 2019-08-31 LAB — RENAL FUNCTION PANEL
Albumin: 1.6 g/dL — ABNORMAL LOW (ref 3.5–5.0)
Anion gap: 10 (ref 5–15)
BUN: 35 mg/dL — ABNORMAL HIGH (ref 6–20)
CO2: 24 mmol/L (ref 22–32)
Calcium: 7.7 mg/dL — ABNORMAL LOW (ref 8.9–10.3)
Chloride: 103 mmol/L (ref 98–111)
Creatinine, Ser: 3.2 mg/dL — ABNORMAL HIGH (ref 0.61–1.24)
GFR calc Af Amer: 24 mL/min — ABNORMAL LOW (ref >60)
GFR calc non Af Amer: 21 mL/min — ABNORMAL LOW (ref >60)
Glucose, Bld: 144 mg/dL — ABNORMAL HIGH (ref 70–99)
Phosphorus: 3.7 mg/dL (ref 2.5–4.6)
Potassium: 3.5 mmol/L (ref 3.5–5.1)
Sodium: 137 mmol/L (ref 135–145)

## 2019-08-31 LAB — GLUCOSE, CAPILLARY
Glucose-Capillary: 143 mg/dL — ABNORMAL HIGH (ref 70–99)
Glucose-Capillary: 151 mg/dL — ABNORMAL HIGH (ref 70–99)
Glucose-Capillary: 154 mg/dL — ABNORMAL HIGH (ref 70–99)
Glucose-Capillary: 166 mg/dL — ABNORMAL HIGH (ref 70–99)
Glucose-Capillary: 187 mg/dL — ABNORMAL HIGH (ref 70–99)
Glucose-Capillary: 195 mg/dL — ABNORMAL HIGH (ref 70–99)

## 2019-08-31 LAB — MAGNESIUM: Magnesium: 2 mg/dL (ref 1.7–2.4)

## 2019-08-31 MED ORDER — HEPARIN SODIUM (PORCINE) 5000 UNIT/ML IJ SOLN
5000.0000 [IU] | Freq: Three times a day (TID) | INTRAMUSCULAR | Status: DC
  Administered 2019-08-31 – 2019-09-10 (×27): 5000 [IU] via SUBCUTANEOUS
  Filled 2019-08-31 (×24): qty 1

## 2019-08-31 MED ORDER — GERHARDT'S BUTT CREAM
TOPICAL_CREAM | Freq: Every day | CUTANEOUS | Status: DC
  Administered 2019-08-31 – 2019-09-03 (×4): via TOPICAL
  Administered 2019-09-04: 1 via TOPICAL
  Administered 2019-09-05: 09:00:00 via TOPICAL
  Administered 2019-09-06: 1 via TOPICAL
  Administered 2019-09-07 – 2019-09-10 (×3): via TOPICAL
  Filled 2019-08-31 (×2): qty 1

## 2019-08-31 MED ORDER — FOLIC ACID 1 MG PO TABS
1.0000 mg | ORAL_TABLET | Freq: Every day | ORAL | Status: DC
  Administered 2019-09-01 – 2019-09-10 (×9): 1 mg via ORAL
  Filled 2019-08-31 (×9): qty 1

## 2019-08-31 MED ORDER — PANTOPRAZOLE SODIUM 40 MG PO TBEC
40.0000 mg | DELAYED_RELEASE_TABLET | Freq: Every day | ORAL | Status: DC
  Administered 2019-08-31 – 2019-09-10 (×10): 40 mg via ORAL
  Filled 2019-08-31 (×10): qty 1

## 2019-08-31 NOTE — Progress Notes (Addendum)
  Progress Note    08/31/2019 8:12 AM 9 Days Post-Op  Subjective:  Denies rest pain L foot   Vitals:   08/31/19 0020 08/31/19 0359  BP: 136/60 (!) 120/57  Pulse: (!) 106 (!) 109  Resp: (!) 26 16  Temp: 98.8 F (37.1 C) 99 F (37.2 C)  SpO2: 98% 98%   Physical Exam: Lungs:  Non labored Incisions:  L groin incision c/d/i; vein harvest and popliteal incisions all healing well Extremities:  L DP and PT monophasic by doppler Neurologic: alert this morning  CBC    Component Value Date/Time   WBC 14.7 (H) 08/30/2019 0528   RBC 2.62 (L) 08/30/2019 0528   HGB 7.6 (L) 08/30/2019 0528   HCT 24.7 (L) 08/30/2019 0528   PLT 46 (L) 08/30/2019 0528   MCV 94.3 08/30/2019 0528   MCH 29.0 08/30/2019 0528   MCHC 30.8 08/30/2019 0528   RDW 21.9 (H) 08/30/2019 0528   LYMPHSABS 3.5 08/26/2019 0500   MONOABS 2.2 (H) 08/26/2019 0500   EOSABS 0.4 08/26/2019 0500   BASOSABS 0.1 08/26/2019 0500    BMET    Component Value Date/Time   NA 137 08/31/2019 0415   K 3.5 08/31/2019 0415   CL 103 08/31/2019 0415   CO2 24 08/31/2019 0415   GLUCOSE 144 (H) 08/31/2019 0415   BUN 35 (H) 08/31/2019 0415   CREATININE 3.20 (H) 08/31/2019 0415   CALCIUM 7.7 (L) 08/31/2019 0415   GFRNONAA 21 (L) 08/31/2019 0415   GFRAA 24 (L) 08/31/2019 0415    INR    Component Value Date/Time   INR 1.7 (H) 08/27/2019 0351     Intake/Output Summary (Last 24 hours) at 08/31/2019 [id number] Last data filed at 08/31/2019 0300 Gross per 24 hour  Intake -  Output 1511 ml  Net -1511 ml     Assessment/Plan:  53 y.o. male is s/p iliac stenting and femoral to PT bypass with composite 9 Days Post-Op   Perfusing L foot with DP and PT monophasic signal by doppler Leukocytosis trending down; recheck CBC in am Encephalopathy: ammonia trending down, continue lactulose, recheck level in am D1 diet; advance as tolerated IHD per Nephrology; will likely need conversion to [facility] next week   DVT prophylaxis:  SCD   Dagoberto Ligas, PA-C [facility] [phone] 08/31/2019 8:12 AM  I have interviewed the patient and examined the patient. I agree with the findings by the PA.  His white blood cell count is coming down.  14.7 yesterday.  His left foot is warm and well-perfused.  Renal is following for intermittent dialysis.  He was dialyzed yesterday.  Gae Gallop, MD [phone]

## 2019-08-31 NOTE — Progress Notes (Addendum)
[facility] ROUNDING NOTE   Subjective:   Is a 53 year old gentleman with a history of diabetes hypertension hyperlipidemia thrombocytopenia and peripheral artery disease.  08/11/2019 he underwent iliac stenting and femoral to posterior tibial bypass..  08/22/2019 he required emergent intubation in the setting of vomiting and bright red blood emesis with decreased mental status, subsequently developed oliguric renal failure requiring CRRT this was started 10/18 until 10/21.  He has been on intermittent hemodialysis treatment since 08/23/2019.  He underwent successful dialysis on 08/30/2019 with ultrafiltration of 100 cc.  He had 900 cc urine   Blood pressure 197/55?  Blood pressure 120/57 prior pulse 107 temperature 98.6  Urine output  900 cc  Sodium 137 potassium 3.5 chloride 103 CO2 24 BUN 35 creatinine 3.2 glucose 144 calcium 7.7 phosphorus 3.7 magnesium 2.0 WBC 14.7 hemoglobin 7.6 platelets 46  IV Protonix 40 mg every 24 hours, insulin glargine 5 units subcu daily     Objective:  Vital signs in last 24 hours:  Temp:  [98.6 F (37 C)-99.9 F (37.7 C)] 98.6 F (37 C) (11/01 0819) Pulse Rate:  [106-124] 107 (11/01 0847) Resp:  [16-27] 21 (11/01 0819) BP: (114-198)/(46-128) 192/50 (11/01 0847) SpO2:  [95 %-100 %] 100 % (11/01 0819) Weight:  [91.1 kg] 91.1 kg (10/31 1110)  Weight change: -0.3 kg Filed Weights   08/30/19 0444 08/30/19 0644 08/30/19 1110  Weight: 91.4 kg 91.7 kg 91.1 kg    Intake/Output: I/O last 3 completed shifts: In: -  Out: 1511 [Urine:800; Other:111; Stool:600]   Intake/Output this shift:  No intake/output data recorded.  General: chronically ill gentleman, lying in bed in soft restraints, agitated  HEENT: moist mucous membranes  CV: RRR  Lungs:CTAB Abdomen: soft, non-distended, non-tender  Extremities: LLE with no edema  Neuro: awake, agitated, repeatedly asking to remove restraints    Basic Metabolic Panel: Recent Labs  Lab  08/27/19 0351 08/28/19 0316 08/29/19 0411 08/30/19 0528 08/31/19 0415  NA 139 141 137 131* 137  K 3.4* 3.7 3.5 3.2* 3.5  CL 108 110 103 97* 103  CO2 21* 21* 22 18* 24  GLUCOSE 192* 172* 193* 201* 144*  BUN 87* 97* 65* 77* 35*  CREATININE 6.36* 5.19* 4.53* 3.86* 3.20*  CALCIUM 8.0* 8.2* 7.9* 8.0* 7.7*  MG 3.2* 3.0* 2.7* 2.1 2.0  PHOS 4.4 6.0* 6.5* 5.6* 3.7    Liver Function Tests: Recent Labs  Lab 08/25/19 0551 08/25/19 0807  08/26/19 0500 08/27/19 0351 08/28/19 0316 08/29/19 0411 08/30/19 0528 08/31/19 0415  AST 554* 554*  --  411*  --  166* 113*  --   --   ALT 155* 155*  --  124*  --  83* 65*  --   --   ALKPHOS 105 101  --  84  --  92 94  --   --   BILITOT 4.0* 4.2*  --  2.8*  --  2.4* 2.2*  --   --   PROT 8.1 7.7  --  6.9  --  7.6 7.3  --   --   ALBUMIN 1.9* 1.8*   < > 1.6* 1.7* 1.8* 1.7* 1.7* 1.6*   < > = values in this interval not displayed.   No results for input(s): LIPASE, AMYLASE in the last 168 hours. Recent Labs  Lab 08/28/19 0942 08/29/19 0411 08/30/19 0528  AMMONIA 91* 67* 60*    CBC: Recent Labs  Lab 08/25/19 0551 08/25/19 [id number] 08/26/19 0500 08/27/19 0351 08/29/19 0411 08/30/19 [id number]  WBC 25.5* 23.8* 19.0* 18.9* 24.2* 14.7*  NEUTROABS 19.3*  --  12.2*  --   --   --   HGB 8.1* 8.2* 7.8* 7.6* 8.3* 7.6*  HCT 26.1* 25.8* 24.5* 24.4* 27.5* 24.7*  MCV 90.6 89.3 90.7 93.5 96.8 94.3  PLT 50* 45* 45* 46* 45* 46*    Cardiac Enzymes: No results for input(s): CKTOTAL, CKMB, CKMBINDEX, TROPONINI in the last 168 hours.  BNP: Invalid input(s): POCBNP  CBG: Recent Labs  Lab 08/30/19 1631 08/30/19 2017 08/31/19 0019 08/31/19 0348 08/31/19 0818  GLUCAP 208* 200* 166* 143* 154*    Microbiology: Results for orders placed or performed during the hospital encounter of 08/11/19  Culture, blood (routine x 2)     Status: None   Collection Time: 08/17/19 10:23 PM   Specimen: BLOOD RIGHT HAND  Result Value Ref Range Status   Specimen Description  BLOOD RIGHT HAND  Final   Special Requests   Final    BOTTLES DRAWN AEROBIC AND ANAEROBIC Blood Culture results may not be optimal due to an inadequate volume of blood received in culture bottles   Culture   Final    NO GROWTH 5 DAYS Performed at [facility], [place] [address] [place], [place] [address]    Report Status 08/22/2019 FINAL  Final  Culture, blood (routine x 2)     Status: None   Collection Time: 08/17/19 10:26 PM   Specimen: BLOOD LEFT HAND  Result Value Ref Range Status   Specimen Description BLOOD LEFT HAND  Final   Special Requests   Final    BOTTLES DRAWN AEROBIC AND ANAEROBIC Blood Culture results may not be optimal due to an inadequate volume of blood received in culture bottles   Culture   Final    NO GROWTH 5 DAYS Performed at [facility], [place] [address] [place], [place] [address]    Report Status 08/22/2019 FINAL  Final  Culture, blood (Routine X 2) w Reflex to ID Panel     Status: None (Preliminary result)   Collection Time: 08/22/19  9:53 AM   Specimen: BLOOD LEFT HAND  Result Value Ref Range Status   Specimen Description BLOOD LEFT HAND  Final   Special Requests   Final    BOTTLES DRAWN AEROBIC ONLY Blood Culture adequate volume   Culture  Setup Time   Final    GRAM POSITIVE RODS AEROBIC BOTTLE ONLY CRITICAL RESULT CALLED TO, READ BACK BY AND VERIFIED WITH: PHARMD T BAUMEISTER [id number] AT 1056 AM BY CM    Culture   Final    CULTURE REINCUBATED FOR BETTER GROWTH Performed at [facility], [place] [address], [place], [place] [address]    Report Status PENDING  Incomplete  Culture, blood (Routine X 2) w Reflex to ID Panel     Status: None   Collection Time: 08/22/19  9:59 AM   Specimen: BLOOD RIGHT HAND  Result Value Ref Range Status   Specimen Description BLOOD RIGHT HAND  Final   Special Requests   Final    BOTTLES DRAWN AEROBIC ONLY Blood Culture adequate volume   Culture   Final    NO GROWTH 5 DAYS Performed at [place], [place] [address] [place], [place] [address]    Report Status 08/27/2019 FINAL  Final  Culture, respiratory (non-expectorated)     Status: None   Collection Time: 08/22/19  6:13 PM   Specimen: Tracheal Aspirate; Respiratory  Result Value  Ref Range Status   Specimen Description TRACHEAL ASPIRATE  Final   Special Requests Normal  Final   Gram Stain   Final    NO WBC SEEN NO ORGANISMS SEEN Performed at [facility], [address], [place], [place] [address]    Culture RARE CANDIDA ALBICANS  Final   Report Status 08/25/2019 FINAL  Final    Coagulation Studies: No results for input(s): LABPROT, INR in the last 72 hours.  Urinalysis: No results for input(s): COLORURINE, LABSPEC, PHURINE, GLUCOSEU, HGBUR, BILIRUBINUR, KETONESUR, PROTEINUR, UROBILINOGEN, NITRITE, LEUKOCYTESUR in the last 72 hours.  Invalid input(s): APPERANCEUR    Imaging: No results found.   Medications:   . sodium chloride Stopped (08/22/19 1311)  . sodium chloride 35 mL (08/22/19 2239)  . magnesium sulfate bolus IVPB     . sodium chloride   Intravenous Once  . sodium chloride   Intravenous Once  . Chlorhexidine Gluconate Cloth  6 each Topical Daily  . Chlorhexidine Gluconate Cloth  6 each Topical Q0600  . Chlorhexidine Gluconate Cloth  6 each Topical Q0600  . darbepoetin (ARANESP) injection - NON-DIALYSIS  150 mcg Subcutaneous Q Mon-1800  . folic acid  1 mg Intravenous Daily  . Gerhardt's butt cream   Topical Daily  . insulin aspart  0-9 Units Subcutaneous Q4H  . insulin glargine  5 Units Subcutaneous QPM  . lactulose  30 g Oral TID  . pantoprazole (PROTONIX) IV  40 mg Intravenous Q24H  . sodium chloride flush  10-40 mL Intracatheter Q12H  . sodium chloride flush  3 mL Intravenous Q12H  . thiamine  100 mg Oral Daily   Or  . thiamine  100 mg Intravenous Daily  . vitamin B-12  1,000 mcg Oral Daily   sodium chloride, alteplase, heparin, influenza vac split quadrivalent PF, labetalol, levalbuterol,  lip balm, magnesium sulfate bolus IVPB, phenol, pneumococcal 23 valent vaccine, Resource ThickenUp Clear, sodium chloride flush, sodium chloride flush  Assessment/ Plan:   Acute kidney injury Baseline serum creatinine appears to be 1 milligrams per deciliter.  The etiology of the acute kidney injury appears to be multifactorial including ischemic ATN and acute blood loss with relative hypotension, contrast nephropathy and bladder outlet obstruction.initiation of CRRT 08/17/2019 at that time he had profound fluid overload and had failed high-dose diuretics including Lasix and metolazone.  CRRT was discontinued 08/20/2019 and intermittent dialysis was started.  He had his last dialysis treatment on 08/28/2019 with removal of 600 cc.  Next dialysis will be planned 09/02/2019.  Continue to monitor for return of renal function.  Hypertension   no longer taking antihypertensive medications.  I am concerned that the blood pressure looks a little higher this morning however previous blood pressure measurements have been good.  We will continue to follow  Acute hypoxic respiratory incision volume overload please have resolved in setting of GI bleed.  Anemia required transfusion 1 unit packed red blood cells 08/23/2019 he had received 2 units packed red blood cells 08/11/2019.  And continues on darbepoetin 150 mcg q. Monday.  Acute encephalopathy with ICU delirium CT head scan - 08/24/2019 history of EtOH abuse.  Is receiving vitamin B multivitamins.  Ammonia level elevated.  Possibly related to underlying liver cirrhosis.  Nasopharyngeal bleed tear with massive hemorrhage 08/22/2019 evaluated seen by ear nose and throat packing removed 08/24/2019  Hyperphosphatemia secondary to end-stage renal disease will need to initiate binders once stable.  Hepatic cirrhosis secondary to EtOH abuse and varices.  Lactulose 30  g 3 times daily  Diabetes insulin sliding scale per primary service   LOS: [place] @TODAY @9 :32 AM

## 2019-08-31 NOTE — Progress Notes (Signed)
PHARMACY - PHYSICIAN COMMUNICATION CRITICAL VALUE ALERT - BLOOD CULTURE IDENTIFICATION (BCID)  [facility]. is an 53 y.o. male who presented to [facility] on 08/11/2019 for a femoral bypass.  Postop complicated with AKI requiring dialysis, nasal bleed with packing, new diagnosis of cirrhosis with encephalopathy, acute respiratory failure  Assessment: 1/4 BC positive for GPRs, will not be running BCID  UPDATE: Micro has been trying to identify this GPR, they have ran it through the [facility] and [facility] machines with mixed results. On gram stain it looks like a gram positive rod coccobacilli, but in order to get a definitive identification they will be sending the organism out for identification.  Current antibiotics: None  Changes to prescribed antibiotics recommended:  Monitor off abx for now, suspect contaminant. Follow up identification from outside lab  No results found for this or any previous visit.  Phillis Haggis 08/31/2019  10:10 AM

## 2019-09-01 LAB — CBC
HCT: 24.1 % — ABNORMAL LOW (ref 39.0–52.0)
Hemoglobin: 7.2 g/dL — ABNORMAL LOW (ref 13.0–17.0)
MCH: 29.1 pg (ref 26.0–34.0)
MCHC: 29.9 g/dL — ABNORMAL LOW (ref 30.0–36.0)
MCV: 97.6 fL (ref 80.0–100.0)
Platelets: 49 10*3/uL — ABNORMAL LOW (ref 150–400)
RBC: 2.47 MIL/uL — ABNORMAL LOW (ref 4.22–5.81)
RDW: 21.9 % — ABNORMAL HIGH (ref 11.5–15.5)
WBC: 8.9 10*3/uL (ref 4.0–10.5)
nRBC: 1 % — ABNORMAL HIGH (ref 0.0–0.2)

## 2019-09-01 LAB — GLUCOSE, CAPILLARY
Glucose-Capillary: 147 mg/dL — ABNORMAL HIGH (ref 70–99)
Glucose-Capillary: 166 mg/dL — ABNORMAL HIGH (ref 70–99)
Glucose-Capillary: 180 mg/dL — ABNORMAL HIGH (ref 70–99)
Glucose-Capillary: 208 mg/dL — ABNORMAL HIGH (ref 70–99)
Glucose-Capillary: 260 mg/dL — ABNORMAL HIGH (ref 70–99)
Glucose-Capillary: 268 mg/dL — ABNORMAL HIGH (ref 70–99)

## 2019-09-01 LAB — MAGNESIUM: Magnesium: 2 mg/dL (ref 1.7–2.4)

## 2019-09-01 LAB — RENAL FUNCTION PANEL
Albumin: 1.7 g/dL — ABNORMAL LOW (ref 3.5–5.0)
Anion gap: 9 (ref 5–15)
BUN: 43 mg/dL — ABNORMAL HIGH (ref 6–20)
CO2: 24 mmol/L (ref 22–32)
Calcium: 7.9 mg/dL — ABNORMAL LOW (ref 8.9–10.3)
Chloride: 108 mmol/L (ref 98–111)
Creatinine, Ser: 2.95 mg/dL — ABNORMAL HIGH (ref 0.61–1.24)
GFR calc Af Amer: 26 mL/min — ABNORMAL LOW (ref >60)
GFR calc non Af Amer: 23 mL/min — ABNORMAL LOW (ref >60)
Glucose, Bld: 149 mg/dL — ABNORMAL HIGH (ref 70–99)
Phosphorus: 4.3 mg/dL (ref 2.5–4.6)
Potassium: 3.2 mmol/L — ABNORMAL LOW (ref 3.5–5.1)
Sodium: 141 mmol/L (ref 135–145)

## 2019-09-01 LAB — AMMONIA: Ammonia: 41 umol/L — ABNORMAL HIGH (ref 9–35)

## 2019-09-01 MED ORDER — NIFEDIPINE ER OSMOTIC RELEASE 30 MG PO TB24
90.0000 mg | ORAL_TABLET | Freq: Every day | ORAL | Status: DC
  Administered 2019-09-01 – 2019-09-10 (×9): 90 mg via ORAL
  Filled 2019-09-01 (×11): qty 3

## 2019-09-01 MED ORDER — LABETALOL HCL 300 MG PO TABS
300.0000 mg | ORAL_TABLET | Freq: Two times a day (BID) | ORAL | Status: DC
  Administered 2019-09-01 – 2019-09-10 (×17): 300 mg via ORAL
  Filled 2019-09-01 (×18): qty 1

## 2019-09-01 MED ORDER — ATORVASTATIN CALCIUM 80 MG PO TABS
80.0000 mg | ORAL_TABLET | Freq: Every day | ORAL | Status: DC
  Administered 2019-09-01 – 2019-09-10 (×9): 80 mg via ORAL
  Filled 2019-09-01 (×9): qty 1

## 2019-09-01 MED ORDER — POTASSIUM CHLORIDE CRYS ER 20 MEQ PO TBCR
40.0000 meq | EXTENDED_RELEASE_TABLET | Freq: Two times a day (BID) | ORAL | Status: AC
  Administered 2019-09-01 (×2): 40 meq via ORAL
  Filled 2019-09-01 (×2): qty 2

## 2019-09-01 NOTE — Evaluation (Signed)
Physical Therapy Evaluation Patient Details Name: Edward Rocha. MRN: [id number] DOB: Apr 18, 1966 Today's Date: 09/01/2019   History of Present Illness  53 y.o. male is s/p L femoral to popliteal bypass and iliac stenting 10 Days Post-Op . Pt with 2 intubations this admission with recent extubation on 10/29, on CRRT until 10/26.    Clinical Impression  Pt demonstrates deficits in functional mobility, gait, balance, endurance, strength, power. Pt with generalized LE weakness, requiring physical support and use of RW to perform all OOB activity. Pt with slow and labored ambulation, with gait speed indicated of recurrent falls risk. Pt will benefit from continued acute PT services to reduce falls risk and to aide in a return to independent mobility. Pt will benefit from high intensity inpatient PT services upon discharge to aide in a progression to their prior level of function.    Follow Up Recommendations CIR;Supervision/Assistance - 24 hour    Equipment Recommendations  None recommended by PT    Recommendations for Other Services       Precautions / Restrictions Precautions Precautions: Fall Restrictions Weight Bearing Restrictions: No      Mobility  Bed Mobility Overal bed mobility: Needs Assistance Bed Mobility: Supine to Sit;Rolling Rolling: Supervision   Supine to sit: Supervision        Transfers Overall transfer level: Needs assistance Equipment used: Rolling walker (2 wheeled) Transfers: Sit to/from Stand Sit to Stand: Mod assist         General transfer comment: pt requiring verbal cues for hand placement and improved technique  Ambulation/Gait Ambulation/Gait assistance: Min assist Gait Distance (Feet): 15 Feet Assistive device: Rolling walker (2 wheeled) Gait Pattern/deviations: Step-to pattern;Shuffle Gait velocity: reduced Gait velocity interpretation: <1.31 ft/sec, indicative of household ambulator General Gait Details: short step to pattern with  significantly reduced gait speed  Stairs            Wheelchair Mobility    Modified Rankin (Stroke Patients Only)       Balance Overall balance assessment: Needs assistance Sitting-balance support: Bilateral upper extremity supported;Feet supported Sitting balance-Leahy Scale: Good Sitting balance - Comments: supervision   Standing balance support: Bilateral upper extremity supported Standing balance-Leahy Scale: Poor Standing balance comment: minA with BUE support                             Pertinent Vitals/Pain Pain Assessment: Faces Faces Pain Scale: Hurts whole lot Pain Location: buttocks Pain Descriptors / Indicators: Burning Pain Intervention(s): Limited activity within patient's tolerance    Home Living Family/patient expects to be discharged to:: Private residence Living Arrangements: Spouse/significant other;Other relatives Available Help at Discharge: Family;Available 24 hours/day Type of Home: House Home Access: Stairs to enter Entrance Stairs-Rails: Can reach both Entrance Stairs-Number of Steps: 1 threshold Home Layout: One level Home Equipment: Walker - 2 wheels;Cane - single point;Walker - 4 wheels      Prior Function Level of Independence: Independent         Comments: used walker/cane as needed, independent ADLs, works as a Social research officer, government Extremity Assessment Upper Extremity Assessment: Generalized weakness    Lower Extremity Assessment Lower Extremity Assessment: Generalized weakness(grossly 4-/5 BLE)    Cervical / Trunk Assessment Cervical / Trunk Assessment: Normal  Communication   Communication: No difficulties  Cognition Arousal/Alertness: Awake/alert Behavior During Therapy: WFL for tasks assessed/performed Overall  Cognitive Status: No family/caregiver present to determine baseline cognitive functioning                   Orientation Level:  Disoriented to;Time(oriented to year, still believes it is Halloween) Current Attention Level: Focused Memory: Decreased recall of precautions;Decreased short-term memory Following Commands: Follows one step commands consistently Safety/Judgement: Decreased awareness of safety;Decreased awareness of deficits   Problem Solving: Slow processing        General Comments General comments (skin integrity, edema, etc.): rectal tube, brown urine in foley catheter    Exercises     Assessment/Plan    PT Assessment Patient needs continued PT services  PT Problem List Decreased strength;Decreased activity tolerance;Decreased balance;Decreased mobility;Decreased cognition;Decreased safety awareness;Decreased knowledge of precautions       PT Treatment Interventions DME instruction;Gait training;Stair training;Functional mobility training;Therapeutic activities;Therapeutic exercise;Balance training;Neuromuscular re-education;Patient/family education    PT Goals (Current goals can be found in the Care Plan section)  Acute Rehab PT Goals Patient Stated Goal: improve mobility PT Goal Formulation: With patient Time For Goal Achievement: 09/15/19 Potential to Achieve Goals: Good    Frequency Min 3X/week   Barriers to discharge        Co-evaluation               AM-PAC PT "6 Clicks" Mobility  Outcome Measure Help needed turning from your back to your side while in a flat bed without using bedrails?: None Help needed moving from lying on your back to sitting on the side of a flat bed without using bedrails?: None Help needed moving to and from a bed to a chair (including a wheelchair)?: A Lot Help needed standing up from a chair using your arms (e.g., wheelchair or bedside chair)?: A Lot Help needed to walk in hospital room?: A Little Help needed climbing 3-5 steps with a railing? : Total 6 Click Score: 16    End of Session Equipment Utilized During Treatment: Gait belt Activity  Tolerance: Patient limited by fatigue;Patient limited by pain Patient left: in chair;with call bell/phone within reach;with chair alarm set Nurse Communication: Mobility status PT Visit Diagnosis: Muscle weakness (generalized) (M62.81) Pain - Right/Left: (bottom) Pain - part of body: (buttocks)    Time: [id number] PT Time Calculation (min) (ACUTE ONLY): 25 min   Charges:   PT Evaluation $PT Eval Low Complexity: Steger, PT, DPT Acute Rehabilitation Pager: [phone]   Zenaida Niece 09/01/2019, 3:57 PM

## 2019-09-01 NOTE — Progress Notes (Addendum)
  Progress Note    09/01/2019 7:30 AM 10 Days Post-Op  Subjective:  No complaints this morning   Vitals:   09/01/19 0042 09/01/19 0500  BP: 138/63 (!) 147/62  Pulse: (!) 107 100  Resp: 20 20  Temp: 98.8 F (37.1 C) 98.8 F (37.1 C)  SpO2: 99% 99%   Physical Exam: Lungs:  Non labored Incisions:  LLE and groin incisions c/d/i Extremities:  L DP and PT monophasic doppler signals Neurologic: A&O  CBC    Component Value Date/Time   WBC 8.9 09/01/2019 0500   RBC 2.47 (L) 09/01/2019 0500   HGB 7.2 (L) 09/01/2019 0500   HCT 24.1 (L) 09/01/2019 0500   PLT 49 (L) 09/01/2019 0500   MCV 97.6 09/01/2019 0500   MCH 29.1 09/01/2019 0500   MCHC 29.9 (L) 09/01/2019 0500   RDW 21.9 (H) 09/01/2019 0500   LYMPHSABS 3.5 08/26/2019 0500   MONOABS 2.2 (H) 08/26/2019 0500   EOSABS 0.4 08/26/2019 0500   BASOSABS 0.1 08/26/2019 0500    BMET    Component Value Date/Time   NA 141 09/01/2019 0500   K 3.2 (L) 09/01/2019 0500   CL 108 09/01/2019 0500   CO2 24 09/01/2019 0500   GLUCOSE 149 (H) 09/01/2019 0500   BUN 43 (H) 09/01/2019 0500   CREATININE 2.95 (H) 09/01/2019 0500   CALCIUM 7.9 (L) 09/01/2019 0500   GFRNONAA 23 (L) 09/01/2019 0500   GFRAA 26 (L) 09/01/2019 0500    INR    Component Value Date/Time   INR 1.7 (H) 08/27/2019 0351     Intake/Output Summary (Last 24 hours) at 09/01/2019 0730 Last data filed at 09/01/2019 0100 Gross per 24 hour  Intake -  Output 700 ml  Net -700 ml     Assessment/Plan:  53 y.o. male is s/p L femoral to popliteal bypass and iliac stenting 10 Days Post-Op   Perfusing LLE well with monophasic DP and PT signal Leukocytosis resolved Encephalopathy: ammonia continues to trend down; patient much more alert Next HD treatment tomorrow; will need to exchange catheter this week Restart home antihypertensives PT/OT for d/c recommendations   Dagoberto Ligas, PA-C [facility] [phone] 09/01/2019 7:30 AM  I have seen  and evaluated the patient. I agree with the PA note as documented above. Doing well.  No rest pain in left foot.  Good left PT/DP signals.  Will need iHD this week.  Will schedule for tunneled dialysis catheter later this week if nephrology feels necessary.  PT/OT for discharge recommendations.  Encephalopathy improving.  Mental status much better.  Incisions healing.   [name], MD [facility] [place] Office: [phone] Pager: [phone]

## 2019-09-01 NOTE — Progress Notes (Addendum)
Edward Rocha  Assessment/ Plan: Pt is a 53 y.o. yo male history of DM, HTN, HLD, thrombocytopenia, PAD underwent aortogram, iliac stenting and femoral-popliteal bypass surgery on 10/23, required emergent intubation for developed oliguric AKI requiring CRRT from 10/18-10/21.  Has been receiving intermittent hemodialysis since 10/24.  #Acute kidney injury likely ischemic ATN in the setting of hypotension and acute blood loss anemia concomitant with contrast nephropathy: Patient initially required CRRT and then intermittent hemodialysis since 10/24.  The last dialysis was on 10/29, tolerated well.  The Cr level is mildly trended down today.  I will review tomorrow's lab before ordering dialysis.  Continue strict ins and out and avoid nephrotoxins.  #Hypertension: On labetalol and nifedipine.  Monitor blood pressure.  #Hypokalemia: Replete potassium chloride.  Monitor lab.  #Acute blood loss anemia: Required blood transfusion.  On ESA.  Monitor hemoglobin  #Acute encephalopathy: He was alert awake and following commands.  Continue to monitor.  #Thrombocytopenia: Platelet count is stable.  Subjective: Seen and examined at bedside.  Patient is lying flat.  Denies nausea vomiting chest pain shortness of breath.  He has external urinary catheter. Objective Vital signs in last 24 hours: Vitals:   08/31/19 1658 08/31/19 2013 09/01/19 0042 09/01/19 0500  BP: (!) 152/68 (!) 165/79 138/63 (!) 147/62  Pulse: (!) 103 (!) 107 (!) 107 100  Resp: 19 (!) 26 20 20   Temp:  99.3 F (37.4 C) 98.8 F (37.1 C) 98.8 F (37.1 C)  TempSrc:  Oral Oral Oral  SpO2:  98% 99% 99%  Weight:    88 kg  Height:       Weight change: -3.1 kg  Intake/Output Summary (Last 24 hours) at 09/01/2019 1352 Last data filed at 09/01/2019 0100 Gross per 24 hour  Intake -  Output 700 ml  Net -700 ml       Labs: Basic Metabolic Panel: Recent Labs  Lab 08/30/19 0528 08/31/19 0415  09/01/19 0500  NA 131* 137 141  K 3.2* 3.5 3.2*  CL 97* 103 108  CO2 18* 24 24  GLUCOSE 201* 144* 149*  BUN 77* 35* 43*  CREATININE 3.86* 3.20* 2.95*  CALCIUM 8.0* 7.7* 7.9*  PHOS 5.6* 3.7 4.3   Liver Function Tests: Recent Labs  Lab 08/26/19 0500  08/28/19 0316 08/29/19 0411 08/30/19 0528 08/31/19 0415 09/01/19 0500  AST 411*  --  166* 113*  --   --   --   ALT 124*  --  83* 65*  --   --   --   ALKPHOS 84  --  92 94  --   --   --   BILITOT 2.8*  --  2.4* 2.2*  --   --   --   PROT 6.9  --  7.6 7.3  --   --   --   ALBUMIN 1.6*   < > 1.8* 1.7* 1.7* 1.6* 1.7*   < > = values in this interval not displayed.   No results for input(s): LIPASE, AMYLASE in the last 168 hours. Recent Labs  Lab 08/29/19 0411 08/30/19 0528 09/01/19 0500  AMMONIA 67* 60* 41*   CBC: Recent Labs  Lab 08/26/19 0500 08/27/19 0351 08/29/19 0411 08/30/19 0528 09/01/19 0500  WBC 19.0* 18.9* 24.2* 14.7* 8.9  NEUTROABS 12.2*  --   --   --   --   HGB 7.8* 7.6* 8.3* 7.6* 7.2*  HCT 24.5* 24.4* 27.5* 24.7* 24.1*  MCV 90.7 93.5 96.8 94.3  97.6  PLT 45* 46* 45* 46* 49*   Cardiac Enzymes: No results for input(s): CKTOTAL, CKMB, CKMBINDEX, TROPONINI in the last 168 hours. CBG: Recent Labs  Lab 08/31/19 1615 08/31/19 2010 09/01/19 0044 09/01/19 0501 09/01/19 1140  GLUCAP 187* 195* 180* 166* 268*    Iron Studies: No results for input(s): IRON, TIBC, TRANSFERRIN, FERRITIN in the last 72 hours. Studies/Results: No results found.  Medications: Infusions: . sodium chloride Stopped (08/22/19 1311)  . sodium chloride 35 mL (08/22/19 2239)  . magnesium sulfate bolus IVPB      Scheduled Medications: . atorvastatin  80 mg Oral Daily  . Chlorhexidine Gluconate Cloth  6 each Topical Daily  . Chlorhexidine Gluconate Cloth  6 each Topical Q0600  . Chlorhexidine Gluconate Cloth  6 each Topical Q0600  . darbepoetin (ARANESP) injection - NON-DIALYSIS  150 mcg Subcutaneous Q Mon-1800  . folic acid  1  mg Oral Daily  . Gerhardt's butt cream   Topical Daily  . heparin injection (subcutaneous)  5,000 Units Subcutaneous Q8H  . insulin aspart  0-9 Units Subcutaneous Q4H  . insulin glargine  5 Units Subcutaneous QPM  . labetalol  300 mg Oral BID  . lactulose  30 g Oral TID  . NIFEdipine  90 mg Oral Daily  . pantoprazole  40 mg Oral Daily  . sodium chloride flush  10-40 mL Intracatheter Q12H  . sodium chloride flush  3 mL Intravenous Q12H  . thiamine  100 mg Oral Daily  . vitamin B-12  1,000 mcg Oral Daily    have reviewed scheduled and prn medications.  Physical Exam: General:NAD, comfortable Heart:RRR, s1s2 nl Lungs:clear b/l, no crackle Abdomen:soft, Non-tender, non-distended Extremities: Right lower extremity incision is healing well. Dialysis Access: Right IJ temporary catheter.  Edward Rocha Tanna Furry 09/01/2019,1:52 PM  LOS: 21 days  Pager: [phone]

## 2019-09-01 NOTE — Progress Notes (Signed)
Patient voided dark brown urine we' continue to monitor.

## 2019-09-01 NOTE — Progress Notes (Signed)
  Speech Language Pathology Treatment: Dysphagia  Patient Details Name: Edward Rocha. MRN: [id number] DOB: [birth year] Today's Date: 09/01/2019 Time: [id number] SLP Time Calculation (min) (ACUTE ONLY): 16 min  Assessment / Plan / Recommendation Clinical Impression  Pt with marked improvements in mentation and, consequently, his swallowing.  Alert, participatory, and appropriate.  No cueing required.  Trials of thin liquids and mechanical soft were consumed independently and with adequate attention, mastication, swift swallow response, and no s/s of aspiration.  Pt taxed with three oz water, which was consumed safely and with no further concerns for dysphagia.  Diet advanced to mechanical soft given absence of teeth; thin liquids; give meds whole in puree.  No further SLP f/u is needed - our service will sign off.    HPI HPI: Pt is a 53 yo who presented on 10/12 for L common femoral to posterior tibial composite bypass with PTFE and saphenous vein. Hospital course was complicated by AKI with CRRT started 10/18, acute respiratory failure, and AMS. Pt required ETT 10/23-10/28. PMH includes: DM, HTN, HLD, thrombocytpenia, and PAD s/p bilateral common iliac stenting on 4/9      SLP Plan  All goals met       Recommendations  Diet recommendations: Dysphagia 3 (mechanical soft);Thin liquid Liquids provided via: Straw Medication Administration: Whole meds with puree Supervision: (assist with tray set-up) Postural Changes and/or Swallow Maneuvers: Seated upright 90 degrees                Oral Care Recommendations: Oral care BID SLP Visit Diagnosis: Dysphagia, oropharyngeal phase (R13.12) Plan: All goals met       GO                Juan Quam Laurice 09/01/2019, 9:30 AM  Estill Bamberg L. Tivis Ringer, [place] Office number [phone] Pager [phone]

## 2019-09-02 LAB — FERRITIN: Ferritin: 300 ng/mL (ref 24–336)

## 2019-09-02 LAB — RENAL FUNCTION PANEL
Albumin: 1.6 g/dL — ABNORMAL LOW (ref 3.5–5.0)
Albumin: 1.6 g/dL — ABNORMAL LOW (ref 3.5–5.0)
Anion gap: 7 (ref 5–15)
Anion gap: 7 (ref 5–15)
BUN: 43 mg/dL — ABNORMAL HIGH (ref 6–20)
BUN: 44 mg/dL — ABNORMAL HIGH (ref 6–20)
CO2: 22 mmol/L (ref 22–32)
CO2: 21 mmol/L — ABNORMAL LOW (ref 22–32)
Calcium: 7.8 mg/dL — ABNORMAL LOW (ref 8.9–10.3)
Calcium: 7.9 mg/dL — ABNORMAL LOW (ref 8.9–10.3)
Chloride: 110 mmol/L (ref 98–111)
Chloride: 110 mmol/L (ref 98–111)
Creatinine, Ser: 2.95 mg/dL — ABNORMAL HIGH (ref 0.61–1.24)
Creatinine, Ser: 2.79 mg/dL — ABNORMAL HIGH (ref 0.61–1.24)
GFR calc Af Amer: 26 mL/min — ABNORMAL LOW (ref >60)
GFR calc Af Amer: 28 mL/min — ABNORMAL LOW (ref >60)
GFR calc non Af Amer: 23 mL/min — ABNORMAL LOW (ref >60)
GFR calc non Af Amer: 24 mL/min — ABNORMAL LOW (ref >60)
Glucose, Bld: 163 mg/dL — ABNORMAL HIGH (ref 70–99)
Glucose, Bld: 199 mg/dL — ABNORMAL HIGH (ref 70–99)
Phosphorus: 3.3 mg/dL (ref 2.5–4.6)
Phosphorus: 3.2 mg/dL (ref 2.5–4.6)
Potassium: 3.9 mmol/L (ref 3.5–5.1)
Potassium: 3.8 mmol/L (ref 3.5–5.1)
Sodium: 139 mmol/L (ref 135–145)
Sodium: 138 mmol/L (ref 135–145)

## 2019-09-02 LAB — CBC
HCT: 25.1 % — ABNORMAL LOW (ref 39.0–52.0)
Hemoglobin: 7.2 g/dL — ABNORMAL LOW (ref 13.0–17.0)
MCH: 29 pg (ref 26.0–34.0)
MCHC: 28.7 g/dL — ABNORMAL LOW (ref 30.0–36.0)
MCV: 101.2 fL — ABNORMAL HIGH (ref 80.0–100.0)
Platelets: 46 10*3/uL — ABNORMAL LOW (ref 150–400)
RBC: 2.48 MIL/uL — ABNORMAL LOW (ref 4.22–5.81)
RDW: 21.6 % — ABNORMAL HIGH (ref 11.5–15.5)
WBC: 7.3 10*3/uL (ref 4.0–10.5)
nRBC: 2.7 % — ABNORMAL HIGH (ref 0.0–0.2)

## 2019-09-02 LAB — GLUCOSE, CAPILLARY
Glucose-Capillary: 167 mg/dL — ABNORMAL HIGH (ref 70–99)
Glucose-Capillary: 186 mg/dL — ABNORMAL HIGH (ref 70–99)
Glucose-Capillary: 269 mg/dL — ABNORMAL HIGH (ref 70–99)
Glucose-Capillary: 221 mg/dL — ABNORMAL HIGH (ref 70–99)
Glucose-Capillary: 184 mg/dL — ABNORMAL HIGH (ref 70–99)

## 2019-09-02 LAB — IRON AND TIBC
Iron: 90 ug/dL (ref 45–182)
Saturation Ratios: 44 % — ABNORMAL HIGH (ref 17.9–39.5)
TIBC: 203 ug/dL — ABNORMAL LOW (ref 250–450)
UIBC: 113 ug/dL

## 2019-09-02 LAB — MAGNESIUM: Magnesium: 1.8 mg/dL (ref 1.7–2.4)

## 2019-09-02 MED ORDER — HEPARIN SODIUM (PORCINE) 1000 UNIT/ML DIALYSIS
1000.0000 [IU] | INTRAMUSCULAR | Status: DC | PRN
  Administered 2019-09-05: 17:00:00 2800 [IU] via INTRAVENOUS_CENTRAL
  Filled 2019-09-02: qty 1

## 2019-09-02 MED ORDER — LIDOCAINE HCL (PF) 1 % IJ SOLN: 5.0000 mL | INTRAMUSCULAR | Status: DC | PRN

## 2019-09-02 MED ORDER — SODIUM CHLORIDE 0.9 % IV SOLN: 100.0000 mL | INTRAVENOUS | Status: DC | PRN

## 2019-09-02 MED ORDER — LIDOCAINE-PRILOCAINE 2.5-2.5 % EX CREA: 1.0000 "application " | TOPICAL_CREAM | CUTANEOUS | Status: DC | PRN

## 2019-09-02 MED ORDER — PENTAFLUOROPROP-TETRAFLUOROETH EX AERO: 1.0000 "application " | INHALATION_SPRAY | CUTANEOUS | Status: DC | PRN

## 2019-09-02 MED ORDER — ALTEPLASE 2 MG IJ SOLR: 2.0000 mg | Freq: Once | INTRAMUSCULAR | Status: DC | PRN

## 2019-09-02 MED ORDER — HEPARIN SODIUM (PORCINE) 1000 UNIT/ML DIALYSIS: 1000.0000 [IU] | INTRAMUSCULAR | Status: DC | PRN

## 2019-09-02 MED ORDER — INSULIN GLARGINE 100 UNIT/ML ~~LOC~~ SOLN
15.0000 [IU] | Freq: Every evening | SUBCUTANEOUS | Status: DC
  Administered 2019-09-02 – 2019-09-09 (×8): 15 [IU] via SUBCUTANEOUS
  Filled 2019-09-02 (×10): qty 0.15

## 2019-09-02 NOTE — Progress Notes (Addendum)
[facility] [place]  Subjective  - Doing better over all.   Objective (!) 162/60 92 98.3 F (36.8 C) (Oral) 20 95% No intake or output data in the 24 hours ending 09/02/19 0719  Left LE incisions healing well Doppler PT/DP intact Right IJ in place  Gen NAD alert and oriented Lungs non labored breathing  Assessment/Planning: 53 y.o. male is s/p L femoral to popliteal bypass and iliac stenting  Patent bypass lef LE PT pending CIR  Currently with right Ij for CRRT pending further recommendations from Nephrology  HGB 7.2 09/01/2019 asymptomatic he has received 4 units PRBC and 1 unit of platelets since admission post acute blood loss anemia.   Will observe. Cr 2.95 pending recommendations from Nephrology on CRRT.      [name] 09/02/2019 7:19 AM --  I have seen and evaluated the patient. I agree with the PA note as documented above. Doing much better last few days.  Excellent PT signal in left foot with no rest pain after left fem PT bypass.  Nephrology following - labs stable.  Nephrology will make decision on CRRT vs iHD etc.  PT recommending CIR.  Consult placed.  Dry dressing placed in left groin.  Encephalopathy significantly improved. A&Ox3.  [name], MD [facility] [place] Office: [phone] Pager: [phone]

## 2019-09-02 NOTE — Plan of Care (Signed)
Goals downgraded to supervision for safety after re-eval

## 2019-09-02 NOTE — Progress Notes (Addendum)
[facility] NEPHROLOGY PROGRESS NOTE  Assessment/ Plan: Pt is a 53 y.o. yo male history of DM, HTN, HLD, thrombocytopenia, PAD underwent aortogram, iliac stenting and femoral-popliteal bypass surgery on 10/23, required emergent intubation for developed oliguric AKI requiring CRRT from 10/18-10/21.  Has been receiving intermittent hemodialysis since 10/24.  #Acute kidney injury likely ischemic ATN in the setting of hypotension and acute blood loss anemia concomitant with contrast nephropathy: Patient initially required CRRT and then intermittent hemodialysis since 10/24.  The last dialysis was on 10/29, tolerated well.   -No urine output documented, he has around 200 cc of dark-colored urine in the bag.  We will check bladder scan to make sure he has no urinary retention.  Serum creatinine level trending down to 2.79 and BUN remains a stable.  His volume status is acceptable.  I will continue to hold dialysis and watch for renal recovery.  Daily assessment for dialysis need.  Discussed with the nursing staff. -For now keep temporary IJ catheter.  Discussed with Dr. Carlis Abbott.  #Hypertension: On labetalol and nifedipine.  Monitor blood pressure.  #Hypokalemia: Replete potassium chloride.  Monitor lab.  #Acute blood loss anemia: Required blood transfusion.  Iron saturation 44%, continue ESA.  Monitor hemoglobin  #Acute encephalopathy: He was alert awake and following commands.  Continue to monitor.  #Thrombocytopenia: Platelet count is stable.  Subjective: Seen and examined at bedside.  No new event.  Denies nausea vomiting chest pain shortness of breath.  No urine output recorded.  He is on room air. Objective Vital signs in last 24 hours: Vitals:   09/02/19 0331 09/02/19 0500 09/02/19 0734 09/02/19 1121  BP: (!) 162/60  (!) 153/61 121/66  Pulse:   98 100  Resp: 20  17 20   Temp: 98.3 F (36.8 C)  98.7 F (37.1 C) 98.6 F (37 C)  TempSrc: Oral  Oral Oral  SpO2: 95%  98% 98%   Weight:  87.6 kg    Height:       Weight change: -0.4 kg No intake or output data in the 24 hours ending 09/02/19 1123     Labs: Basic Metabolic Panel: Recent Labs  Lab 09/01/19 0500 09/02/19 0243 09/02/19 0715  NA 141 139 138  K 3.2* 3.9 3.8  CL 108 110 110  CO2 24 22 21*  GLUCOSE 149* 163* 199*  BUN 43* 43* 44*  CREATININE 2.95* 2.95* 2.79*  CALCIUM 7.9* 7.8* 7.9*  PHOS 4.3 3.3 3.2   Liver Function Tests: Recent Labs  Lab 08/28/19 0316 08/29/19 0411  09/01/19 0500 09/02/19 0243 09/02/19 0715  AST 166* 113*  --   --   --   --   ALT 83* 65*  --   --   --   --   ALKPHOS 92 94  --   --   --   --   BILITOT 2.4* 2.2*  --   --   --   --   PROT 7.6 7.3  --   --   --   --   ALBUMIN 1.8* 1.7*   < > 1.7* 1.6* 1.6*   < > = values in this interval not displayed.   No results for input(s): LIPASE, AMYLASE in the last 168 hours. Recent Labs  Lab 08/29/19 0411 08/30/19 0528 09/01/19 0500  AMMONIA 67* 60* 41*   CBC: Recent Labs  Lab 08/27/19 0351 08/29/19 0411 08/30/19 0528 09/01/19 0500 09/02/19 0715  WBC 18.9* 24.2* 14.7* 8.9 7.3  HGB 7.6*  8.3* 7.6* 7.2* 7.2*  HCT 24.4* 27.5* 24.7* 24.1* 25.1*  MCV 93.5 96.8 94.3 97.6 101.2*  PLT 46* 45* 46* 49* 46*   Cardiac Enzymes: No results for input(s): CKTOTAL, CKMB, CKMBINDEX, TROPONINI in the last 168 hours. CBG: Recent Labs  Lab 09/01/19 1809 09/01/19 1957 09/01/19 2356 09/02/19 0508 09/02/19 0752  GLUCAP 260* 208* 147* 167* 186*    Iron Studies:  Recent Labs    09/02/19 0403  IRON 90  TIBC 203*  FERRITIN 300   Studies/Results: No results found.  Medications: Infusions: . sodium chloride Stopped (08/22/19 1311)  . sodium chloride 35 mL (08/22/19 2239)  . sodium chloride    . sodium chloride    . magnesium sulfate bolus IVPB      Scheduled Medications: . atorvastatin  80 mg Oral Daily  . Chlorhexidine Gluconate Cloth  6 each Topical Daily  . Chlorhexidine Gluconate Cloth  6 each Topical  Q0600  . Chlorhexidine Gluconate Cloth  6 each Topical Q0600  . darbepoetin (ARANESP) injection - NON-DIALYSIS  150 mcg Subcutaneous Q Mon-1800  . folic acid  1 mg Oral Daily  . Gerhardt's butt cream   Topical Daily  . heparin injection (subcutaneous)  5,000 Units Subcutaneous Q8H  . insulin aspart  0-9 Units Subcutaneous Q4H  . insulin glargine  5 Units Subcutaneous QPM  . labetalol  300 mg Oral BID  . lactulose  30 g Oral TID  . NIFEdipine  90 mg Oral Daily  . pantoprazole  40 mg Oral Daily  . sodium chloride flush  10-40 mL Intracatheter Q12H  . sodium chloride flush  3 mL Intravenous Q12H  . thiamine  100 mg Oral Daily  . vitamin B-12  1,000 mcg Oral Daily    have reviewed scheduled and prn medications.  Physical Exam: General:NAD, comfortable, able to lie flat Heart:RRR, s1s2 nl Lungs: Clear b/l, no crackle Abdomen:soft, Non-tender, non-distended Extremities: Right lower extremity incision is healing well. Dialysis Access: Right IJ temporary catheter.  Adley Mazurowski Prasad Marlowe Lawes 09/02/2019,11:23 AM  LOS: 22 days  Pager: [phone]

## 2019-09-02 NOTE — Progress Notes (Signed)
PT Cancellation Note  Patient Details Name: Edward Rocha. MRN: [id number] DOB: 04/14/1966   Cancelled Treatment:    Reason Eval/Treat Not Completed: Patient at procedure or test/unavailable;Other (comment) Pt just transferring back to bed this AM and declined PT. Attempted this PM however pt declines. Will follow.   Marguarite Arbour A Deliana Avalos 09/02/2019, 2:22 PM Marisa Severin, PT, DPT Acute Rehabilitation Services Pager [phone] Office [phone]

## 2019-09-02 NOTE — Evaluation (Signed)
Occupational Therapy Evaluation Patient Details Name: Edward Rocha. MRN: [id number] DOB: May 01, 1966 Today's Date: 09/02/2019    History of Present Illness 53 y.o. male is s/p L femoral to popliteal bypass and iliac stenting 10 Days Post-Op . Pt with 2 intubations this admission with recent extubation on 10/29, on CRRT until 10/26.     Clinical Impression   Pt re-evaluated secondary to change in status. Pt continues to have decreased I in self care, functional mobility/transfers, balance, cognition, endurance, and strength. Pt would benefit from OT intervention to address these deficits. Pt currently performing self care tasks at set up a for UB self care and max A for LB self care. Mod A for sit <>stand and functional transfers with RW.     Follow Up Recommendations  CIR;Supervision/Assistance - 24 hour    Equipment Recommendations  Other (comment)(defer to next venue of care)    Recommendations for Other Services Rehab consult     Precautions / Restrictions Precautions Precautions: Fall Restrictions Weight Bearing Restrictions: No      Mobility Bed Mobility   Bed Mobility: Rolling      General bed mobility comments: seated in recliner chair  Transfers Overall transfer level: Needs assistance Equipment used: Rolling walker (2 wheeled) Transfers: Sit to/from Stand Sit to Stand: Mod assist         General transfer comment: verbal cuing for hand placement and mod lifting assistance    Balance Overall balance assessment: Needs assistance Sitting-balance support: Bilateral upper extremity supported;Feet supported Sitting balance-Leahy Scale: Good Sitting balance - Comments: supervision   Standing balance support: Bilateral upper extremity supported Standing balance-Leahy Scale: Poor Standing balance comment: minA with BUE support on RW           ADL either performed or assessed with clinical judgement   ADL Overall ADL's : Needs  assistance/impaired Eating/Feeding: Set up;Sitting   Grooming: Wash/dry hands;Wash/dry face;Oral care;Applying deodorant;Sitting;Set up   Upper Body Bathing: Set up;Sitting   Lower Body Bathing: Moderate assistance;Minimal assistance;Sit to/from stand   Upper Body Dressing : Set up;Sitting   Lower Body Dressing: Minimal assistance;Sit to/from stand;Moderate assistance   Toilet Transfer: Moderate assistance;RW   Toileting- Clothing Manipulation and Hygiene: Maximal assistance         General ADL Comments: pt limited by pain, impaired balance, generalized weakness, decreased activity tolerance     Vision Patient Visual Report: No change from baseline              Pertinent Vitals/Pain Pain Assessment: Faces Faces Pain Scale: Hurts even more Pain Location: buttocks Pain Descriptors / Indicators: Burning Pain Intervention(s): Limited activity within patient's tolerance;Repositioned     Hand Dominance Right   Extremity/Trunk Assessment Upper Extremity Assessment Upper Extremity Assessment: Generalized weakness   Lower Extremity Assessment Lower Extremity Assessment: Defer to PT evaluation   Cervical / Trunk Assessment Cervical / Trunk Assessment: Normal   Communication Communication Communication: No difficulties   Cognition Arousal/Alertness: Awake/alert Behavior During Therapy: WFL for tasks assessed/performed Overall Cognitive Status: No family/caregiver present to determine baseline cognitive functioning Area of Impairment: Attention        Orientation Level: Disoriented to;Time Current Attention Level: Focused Memory: Decreased recall of precautions;Decreased short-term memory Following Commands: Follows one step commands consistently Safety/Judgement: Decreased awareness of safety;Decreased awareness of deficits Awareness: Emergent;Intellectual Problem Solving: Slow processing General Comments: agreeable but very confused throughout session               Home Living Family/patient expects to  be discharged to:: Private residence Living Arrangements: Spouse/significant other;Other relatives Available Help at Discharge: Family;Available 24 hours/day Type of Home: House Home Access: Stairs to enter   Entrance Stairs-Rails: Can reach both Home Layout: One level     Bathroom Shower/Tub: Teacher, early years/pre: Standard     Home Equipment: Environmental consultant - 2 wheels;Cane - single point;Walker - 4 wheels          Prior Functioning/Environment Level of Independence: Independent        Comments: used walker/cane as needed, independent ADLs, works as a Scientist, clinical (histocompatibility and immunogenetics) Problem List: Decreased activity tolerance;Impaired balance (sitting and/or standing);Decreased knowledge of use of DME or AE;Decreased knowledge of precautions;Pain;Increased edema      OT Treatment/Interventions: Self-care/ADL training;DME and/or AE instruction;Therapeutic activities;Patient/family education;Balance training    OT Goals(Current goals can be found in the care plan section) Acute Rehab OT Goals Patient Stated Goal: improve mobility OT Goal Formulation: With patient Time For Goal Achievement: 09/16/19 Potential to Achieve Goals: Good ADL Goals Pt Will Perform Lower Body Dressing: with supervision(downgraded for safety) Pt Will Transfer to Toilet: with supervision(downgraded for safety) Pt Will Perform Toileting - Clothing Manipulation and hygiene: with supervision(downgraded)  OT Frequency: Min 2X/week    AM-PAC OT "6 Clicks" Daily Activity     Outcome Measure Help from another person eating meals?: A Little Help from another person taking care of personal grooming?: A Little Help from another person toileting, which includes using toliet, bedpan, or urinal?: A Lot Help from another person bathing (including washing, rinsing, drying)?: A Lot Help from another person to put on and taking off regular upper body clothing?: A  Little Help from another person to put on and taking off regular lower body clothing?: A Lot 6 Click Score: 15   End of Session Nurse Communication: Mobility status  Activity Tolerance: Patient tolerated treatment well Patient left: in chair;with call bell/phone within reach;with chair alarm set  OT Visit Diagnosis: Other abnormalities of gait and mobility (R26.89);Pain;Other symptoms and signs involving cognitive function;Muscle weakness (generalized) (M62.81) Pain - part of body: (buttocks)                Time: [id number] OT Time Calculation (min): 11 min Charges:  OT General Charges $OT Visit: 1 Visit OT Evaluation $OT Re-eval: 1 Re-eval  Kymora Sciara P 09/02/2019, 10:11 AM

## 2019-09-03 LAB — RENAL FUNCTION PANEL
Albumin: 1.5 g/dL — ABNORMAL LOW (ref 3.5–5.0)
Anion gap: 8 (ref 5–15)
BUN: 45 mg/dL — ABNORMAL HIGH (ref 6–20)
CO2: 21 mmol/L — ABNORMAL LOW (ref 22–32)
Calcium: 7.8 mg/dL — ABNORMAL LOW (ref 8.9–10.3)
Chloride: 106 mmol/L (ref 98–111)
Creatinine, Ser: 2.65 mg/dL — ABNORMAL HIGH (ref 0.61–1.24)
GFR calc Af Amer: 30 mL/min — ABNORMAL LOW (ref >60)
GFR calc non Af Amer: 26 mL/min — ABNORMAL LOW (ref >60)
Glucose, Bld: 139 mg/dL — ABNORMAL HIGH (ref 70–99)
Phosphorus: 3 mg/dL (ref 2.5–4.6)
Potassium: 3.6 mmol/L (ref 3.5–5.1)
Sodium: 135 mmol/L (ref 135–145)

## 2019-09-03 LAB — GLUCOSE, CAPILLARY
Glucose-Capillary: 134 mg/dL — ABNORMAL HIGH (ref 70–99)
Glucose-Capillary: 145 mg/dL — ABNORMAL HIGH (ref 70–99)
Glucose-Capillary: 167 mg/dL — ABNORMAL HIGH (ref 70–99)
Glucose-Capillary: 180 mg/dL — ABNORMAL HIGH (ref 70–99)
Glucose-Capillary: 192 mg/dL — ABNORMAL HIGH (ref 70–99)
Glucose-Capillary: 196 mg/dL — ABNORMAL HIGH (ref 70–99)
Glucose-Capillary: 214 mg/dL — ABNORMAL HIGH (ref 70–99)

## 2019-09-03 LAB — AMMONIA: Ammonia: 39 umol/L — ABNORMAL HIGH (ref 9–35)

## 2019-09-03 LAB — MAGNESIUM: Magnesium: 1.6 mg/dL — ABNORMAL LOW (ref 1.7–2.4)

## 2019-09-03 MED ORDER — ALBUMIN HUMAN 25 % IV SOLN
25.0000 g | Freq: Four times a day (QID) | INTRAVENOUS | Status: AC
  Administered 2019-09-03 (×2): 25 g via INTRAVENOUS
  Filled 2019-09-03 (×2): qty 100

## 2019-09-03 MED ORDER — ADULT MULTIVITAMIN W/MINERALS CH
1.0000 | ORAL_TABLET | Freq: Every day | ORAL | Status: DC
  Administered 2019-09-03 – 2019-09-10 (×7): 1 via ORAL
  Filled 2019-09-03 (×7): qty 1

## 2019-09-03 NOTE — Progress Notes (Signed)
Physical Therapy Treatment Patient Details Name: Edward Rocha. MRN: [id number] DOB: [birth year] Today's Date: 09/03/2019    History of Present Illness 53 y.o. male is s/p L femoral to popliteal bypass and iliac stenting 10 Days Post-Op . Pt with 2 intubations this admission with recent extubation on 10/29, on CRRT until 10/26.      PT Comments    Patient seen for mobility progression. Pt is eager to participate in therapy and is making progress toward PT goals. Pt requires mod A for sit to stand transfers and min A for gait training distance of 60 ft total during session. Continue to progress as tolerated and recommend CIR for further skilled PT services.    Follow Up Recommendations  CIR;Supervision/Assistance - 24 hour     Equipment Recommendations  None recommended by PT    Recommendations for Other Services       Precautions / Restrictions Precautions Precautions: Fall Restrictions Weight Bearing Restrictions: No    Mobility  Bed Mobility               General bed mobility comments: pt OOB in chair upon arrival  Transfers Overall transfer level: Needs assistance Equipment used: Rolling walker (2 wheeled) Transfers: Sit to/from Stand Sit to Stand: Mod assist         General transfer comment: assistance required to power up into standing X 3 trials during session  Ambulation/Gait Ambulation/Gait assistance: Min assist Gait Distance (Feet): (60 ft total with seated break) Assistive device: Rolling walker (2 wheeled) Gait Pattern/deviations: Step-to pattern;Shuffle;Trunk flexed;Wide base of support Gait velocity: decreased   General Gait Details: cues for upright posture and increased bilat step lengths   Stairs             Wheelchair Mobility    Modified Rankin (Stroke Patients Only)       Balance Overall balance assessment: Needs assistance Sitting-balance support: Feet supported Sitting balance-Leahy Scale: Good     Standing  balance support: Bilateral upper extremity supported Standing balance-Leahy Scale: Poor                              Cognition Arousal/Alertness: Awake/alert Behavior During Therapy: WFL for tasks assessed/performed Overall Cognitive Status: Within Functional Limits for tasks assessed                                        Exercises      General Comments General comments (skin integrity, edema, etc.): HR and SpO2 WNL; pt SOB with mobility; pt with bleeding from abdomen upon mobilizing and gauze/tape applied--RN notified      Pertinent Vitals/Pain Pain Assessment: Faces Faces Pain Scale: Hurts even more Pain Location: buttocks Pain Descriptors / Indicators: Sore Pain Intervention(s): Limited activity within patient's tolerance;Monitored during session;Repositioned    Home Living                      Prior Function            PT Goals (current goals can now be found in the care plan section) Progress towards PT goals: Progressing toward goals    Frequency    Min 3X/week      PT Plan Current plan remains appropriate;Frequency needs to be updated    Co-evaluation  AM-PAC PT "6 Clicks" Mobility   Outcome Measure  Help needed turning from your back to your side while in a flat bed without using bedrails?: None Help needed moving from lying on your back to sitting on the side of a flat bed without using bedrails?: None Help needed moving to and from a bed to a chair (including a wheelchair)?: A Lot Help needed standing up from a chair using your arms (e.g., wheelchair or bedside chair)?: A Lot Help needed to walk in hospital room?: A Little Help needed climbing 3-5 steps with a railing? : Total 6 Click Score: 16    End of Session Equipment Utilized During Treatment: Gait belt Activity Tolerance: Patient limited by fatigue Patient left: in chair;with call bell/phone within reach Nurse Communication: Mobility  status PT Visit Diagnosis: Muscle weakness (generalized) (M62.81) Pain - part of body: (buttocks)     Time: [id number] PT Time Calculation (min) (ACUTE ONLY): 41 min  Charges:  $Gait Training: 23-37 mins $Therapeutic Activity: 8-22 mins                     Earney Navy, PTA Acute Rehabilitation Services Pager: [phone] Office: [phone]     Darliss Cheney 09/03/2019, 12:57 PM

## 2019-09-03 NOTE — Progress Notes (Signed)
Flexi-Seal removed per MD order without difficulty.  Will continue to monitor.

## 2019-09-03 NOTE — Progress Notes (Addendum)
[facility]  Subjective  - Doing better each day.   Objective (!) 143/61 95 99.5 F (37.5 C) (Oral) 19 98%  Intake/Output Summary (Last 24 hours) at 09/03/2019 0834 Last data filed at 09/03/2019 0400 Gross per 24 hour  Intake 1150 ml  Output 2280 ml  Net -1130 ml    Left LE warm with intact range of motion well perfused.  Incisions healing well.  Dry guaze over groin incision superficial skin wound.  Lungs non labored breathing Gen NAD  Assessment/Planning: 53 y.o.maleis s/p L femoral to popliteal bypass and iliac stenting  Pending CIR eval for rehabilitation. Right IJ in place last HD AKI requiring CRRT from 10/18-10/21.  UO 980 ml last 24 hours Cr continues to decrease now 2.65.  Followed by Nephrology Hypertension: On labetalol and nifedipine.  Monitor blood pressure. HGB 7.2 stable last lab 09/02/19    Edward Rocha 09/03/2019 8:34 AM --  Laboratory Lab Results: Recent Labs    09/01/19 0500 09/02/19 0715  WBC 8.9 7.3  HGB 7.2* 7.2*  HCT 24.1* 25.1*  PLT 49* 46*   BMET Recent Labs    09/02/19 0715 09/03/19 0432  NA 138 135  K 3.8 3.6  CL 110 106  CO2 21* 21*  GLUCOSE 199* 139*  BUN 44* 45*  CREATININE 2.79* 2.65*  CALCIUM 7.9* 7.8*    COAG Lab Results  Component Value Date   INR 1.7 (H) 08/27/2019   INR 1.4 (H) 08/22/2019   INR 1.5 (H) 08/22/2019   No results found for: PTT  I have seen and evaluated the patient. I agree with the PA note as documented above.  Continues to make significant improvement.  He is status post left common femoral to posterior tibial bypass with composite PTFE nonreversed saphenous vein bypass on 08/11/2019.  Complicated postop course with renal failure and volume overload in ICU requiring CRRT as well as encephalopathy likely due to underlying cirrhosis.  Now is very alert and appropriate.  He has an excellent PT signal in the left foot at the ankle.  All of incisions are  healing.  I am keeping a dry dressing in the left groin. CIR consulted for placement.  Has a temporary right IJ catheter in place but is now making urine showing some signs of renal recovery.  Creatinine down from 2.7 to 2.6.  Discussed with Edward Rocha yesterday and will observe for now.  Edward Heck, MD [facility] Office: [phone] Pager: [phone]

## 2019-09-03 NOTE — Progress Notes (Signed)
Nutrition Follow-up  DOCUMENTATION CODES:   Not applicable  INTERVENTION:   -D/C Vital Cuisine Shake TID   Continue Magic cup TID with meals, each supplement provides 290 kcal and 9 grams of protein  Add snacks BID  Add MVI daily   NUTRITION DIAGNOSIS:   Increased nutrient needs related to acute illness (AKI requiring CRRT) as evidenced by estimated needs.  Ongoing  GOAL:   Patient will meet greater than or equal to 90% of their needs  Progressing  MONITOR:   PO intake, Supplement acceptance, Diet advancement, Labs, Weight trends, Skin, I & O's  REASON FOR ASSESSMENT:   Rounds    ASSESSMENT:   Patient with PMH significant for HTN, HLD, DM, and PAD s/p bilateral common iliac stenting on 4/9 . Presents this admission with continued leg leg pain.  10/12 - s/p L femoral PTA bypass 10/18 - R IJ, CRRT initiated 10/21 - CRRT stopped 10/23 - gastric Cortrak placed, re-intubated  10/24 - poor toleration iHD, placed back on CRRT 10/26 - CRRT stopped 10/28 - extubated 10/29 - iHD 10/30 - diet advanced to DYS 1 with nectar-thick liquids  Diet advanced to DYS 3 thin liquids yesterday. Meal completions charted as 50-75% for pt's last two meals. Pt reports his appetite has progressed significantly over the last two days. Does not like vital shakes, not willing to try Ensure. RD to send snacks per pt preference.   Last HD on 10/31. Now being held to monitor for renal recovery. Rectal tube remains in place.  Admission weight: 98.4 kg  Current weight: 87.4 kg    I/O: +325 ml since 10/21 UOP: 980 ml x 24 hrs Rectal tube: 1,300 ml x 24 hrs   Medications: aranesp, folic acid, SS novolog, lantus, lactulose, thiamine, Vit B12 Labs: Mg 1.6 (L) Cr 2.65-trending down CBG 143-268   Diet Order:   Diet Order            DIET DYS 3 Room service appropriate? Yes with Assist; Fluid consistency: Thin  Diet effective now              EDUCATION NEEDS:   Not appropriate for  education at this time  Skin:  Skin Assessment: Skin Integrity Issues: Skin Integrity Issues: Stage II: rectum Incisions: left leg MASD- buttocks  Last BM:  11/4 rectal tube  Height:   Ht Readings from Last 1 Encounters:  08/30/19 6\' 1"  (1.854 m)    Weight:   Wt Readings from Last 1 Encounters:  09/03/19 87.4 kg    Ideal Body Weight:  83.6 kg  BMI:  Body mass index is 25.42 kg/m.  Estimated Nutritional Needs:   Kcal:  [id number]  Protein:  115-130 grams  Fluid:  UOP + 1000 ml  Mariana Single RD, LDN Clinical Nutrition Pager # [phone]

## 2019-09-03 NOTE — Consult Note (Signed)
Inpatient Rehabilitation Admissions Coordinator  Inpatient rehab consult received. I met with patient at bedside. We discussed goals and expectations of an inpt rehab admit. He is in agreement. Barriers to admit are his rectal tube and he has many complaints about this as well as dialysis plans  Noted no Hemodialysis since 10/29. I will begin [employer] approval for a possible CIR admit penidng nephrology clarification of hemodialysis plans.  Danne Baxter, RN, MSN Rehab Admissions Coordinator [phone] 09/03/2019 10:57 AM

## 2019-09-03 NOTE — Discharge Summary (Signed)
[facility] Discharge Summary   Patient ID:  Edward Rocha. MRN: [id number] DOB/AGE: [birth year] 53 y.o.  Admit date: 08/11/2019 Discharge date: 09/11/2019 Date of Surgery: 08/11/2019 - 08/22/2019 Surgeon: Juliann Mule): Waynetta Sandy, Edward Rocha  Admission Diagnosis: CLAUDICATION  Discharge Diagnoses:  CLAUDICATION  Secondary Diagnoses: Past Medical History:  Diagnosis Date  . Acute renal failure (ARF) ([place])    12/2018 when admitted for hyperosmolar hyperglycemic nonketotic syndrome  . Bilateral pain of leg and foot   . Diabetes mellitus without complication ([place])    Type II  . Dizziness   . Elevated LFTs 05/2019  . Frequent urination   . Hyperglycemia    Non-Ketotic Hyperosmolar.  . Hyperlipidemia   . Hypernatremia   . Hypertension   . Peripheral vascular disease ([place])   . Syncope    12/2018, diagnosed with hyperosmolar hyperglycemic nonketotic syndrome, AKI, hyponatremia ([facility])  . Thrombocytopenia ([place])   . Thyroid disease    Hypothyroidism  . Weakness     Procedure(s): Procedure:08/11/2019 1.  Aortogram with left iliac arteriogram 2.  Harvest of left great saphenous vein 3.  Left common femoral artery to posterior tibial artery bypass with composite 6 mm PTFE graft and non-reversed great saphenous vein Ultrasound-guided placement of right IJ 19 cm tunneled dialysis catheter placement  Discharged Condition: stable  HPI:  Edward Rochais a 53 y.o.malewith history of hypertension, hyperlipidemia, diabetes that presents for follow-up after bilateral common iliac stent placement on 4/9/2020and thenleftexternal iliac stent on 04/30/19.He was initially seen by Dr. Donnetta Hutching on 02/04/2019 with evidence of critical limb ischemia of the bilateral lower extremities with symptoms consistent with rest pain. Ultimately underwent aortogram,lower extremity arteriogram with me on 02/06/2019. At that time I placed bilateral common iliac stents.  We took him back for another aortogram given that it is 1 month follow-up they could not visualize his common iliac stents with monophasic runoff in both iliacs. During that time we did identify a left external iliac stenosis that was stented and his previous bilateral kissing stents were not patent.  He continuestocomplain of pain in his left leg. He states no significant provement with the most recent left external iliac stent.  Hospital Course:  Edward Rocha. is a 53 y.o. male is S/P  Procedure(s): Procedure:08/11/2019 1.  Aortogram with left iliac arteriogram 2.  Harvest of left great saphenous vein 3.  Left common femoral artery to posterior tibial artery bypass with composite 6 mm PTFE graft and non-reversed great saphenous vein Ultrasound-guided placement of right IJ 19 cm tunneled dialysis catheter placement ESOPHAGOGASTRODUODENOSCOPY (EGD) ESOPHAGEAL BANDING Upper airway hemorrage Continues to make significant improvement.  He is status post left common femoral to posterior tibial bypass with composite PTFE nonreversed saphenous vein bypass on 08/11/2019.  Complicated postop course with renal failure and volume overload in ICU requiring CRRT as well as encephalopathy likely due to underlying cirrhosis.  Now is very alert and appropriate.  He has an excellent PT signal in the left foot at the ankle.  All of incisions are healing.  I am keeping a dry dressing in the left groin. CIR consulted for placement.  Has a temporary right IJ catheter in place but is now making urine showing some signs of renal recovery.  Creatinine down from 2.7 to 2.6.  Discussed with Dr. Carolin Sicks and will observe for now.  Right IJ in place last HD AKI requiring CRRT from 10/18-10/21.    Consults:  Treatment Team:  Edward Heinz,  Edward Rocha Gaylene Brooks A, DO  Significant Diagnostic Studies: CBC Lab Results  Component Value Date   WBC 3.5 (L) 09/09/2019   HGB 8.7 (L) 09/09/2019   HCT 28.4 (L)  09/09/2019   MCV 92.2 09/09/2019   PLT 50 (L) 09/09/2019    BMET    Component Value Date/Time   NA 130 (L) 09/10/2019 0218   K 4.3 09/10/2019 0218   CL 100 09/10/2019 0218   CO2 21 (L) 09/10/2019 0218   GLUCOSE 278 (H) 09/10/2019 0218   BUN 26 (H) 09/10/2019 0218   CREATININE 5.09 (H) 09/10/2019 0218   CALCIUM 7.7 (L) 09/10/2019 0218   GFRNONAA 12 (L) 09/10/2019 0218   GFRAA 14 (L) 09/10/2019 0218   COAG Lab Results  Component Value Date   INR 1.7 (H) 08/27/2019   INR 1.4 (H) 08/22/2019   INR 1.5 (H) 08/22/2019     Disposition:  Discharge to :Home Discharge Instructions    Call Edward Rocha for:  redness, tenderness, or signs of infection (pain, swelling, bleeding, redness, odor or green/yellow discharge around incision site)   Complete by: As directed    Call Edward Rocha for:  severe or increased pain, loss or decreased feeling  in affected limb(s)   Complete by: As directed    Call Edward Rocha for:  temperature >100.5   Complete by: As directed    Discharge instructions   Complete by: As directed    Keep left groin clean and dry with guaze after bathing daily.   Resume previous diet   Complete by: As directed      Allergies as of 09/10/2019      Reactions   Other    Pt received platelets and had a bad reaction from infusion       Medication List    TAKE these medications   Accu-Chek FastClix Lancets Misc TEST ONC EDAILY   acetaminophen 500 MG tablet Commonly known as: TYLENOL Take 1,000 mg by mouth daily as needed for moderate pain or headache.   atorvastatin 80 MG tablet Commonly known as: LIPITOR Take 80 mg by mouth daily.   Basaglar KwikPen 100 UNIT/ML Sopn Inject 20 Units into the skin every evening.   chlorthalidone 50 MG tablet Commonly known as: HYGROTON Take 50 mg by mouth daily.   clopidogrel 75 MG tablet Commonly known as: PLAVIX Take 75 mg by mouth daily.   diphenhydrAMINE 25 MG tablet Commonly known as: BENADRYL Take 25 mg by mouth daily as needed for  itching.   gabapentin 300 MG capsule Commonly known as: NEURONTIN Take 300 mg by mouth 3 (three) times daily.   glipiZIDE 5 MG tablet Commonly known as: GLUCOTROL Take 5 mg by mouth 2 (two) times daily before a meal.   Jardiance 10 MG Tabs tablet Generic drug: empagliflozin Take 10 mg by mouth daily.   labetalol 300 MG tablet Commonly known as: NORMODYNE Take 300 mg by mouth 2 (two) times daily.   NIFEdipine 90 MG 24 hr tablet Commonly known as: PROCARDIA XL/NIFEDICAL-XL Take 90 mg by mouth daily.   oxyCODONE-acetaminophen 5-325 MG tablet Commonly known as: PERCOCET/ROXICET Take 1 tablet by mouth every 6 (six) hours as needed.      Verbal and written Discharge instructions given to the patient. Wound care per Discharge AVS Follow-up Information    [facility] Follow up.   Why: [id number]- intake referral #- [id number]-- they will contact you when agnecy found to contract with for HHPT needs.  Contact information: [facility]  needs with insurance- referral made for [place] Follow up.   Why: 3n1 and RW  arranged- to be delivered to room prior to discharge       [name], Edward Rocha Follow up in 3 week(s).   Specialty: Vascular Surgery Contact information: [place] [id number] [phone]           Signed: [name] 09/11/2019, 12:35 PM - For VQI Registry use --- Instructions: Press F2 to tab through selections.  Delete question if not applicable.   Post-op:  Wound infection: No  Graft infection: No  Transfusion: Yes  If yes, 4 units given PRBC and 1 unit Platelets New Arrhythmia: No Ipsilateral amputation: [x ] no, [ ]  Minor, [ ]  BKA, [ ]  AKA Discharge patency: [x ] Primary, [ ]  Primary assisted, [ ]  Secondary, [ ]  Occluded Patency judged by: [x ] Dopper only, [ ]  Palpable graft pulse, [ ]  Palpable distal pulse, [ ]   D/C Ambulatory Status: Ambulatory with Assistance  Complications: MI: [x ] No, [  ] Troponin only, [ ]  EKG or Clinical CHF: No Resp failure: [ ]  none, [ ]  Pneumonia, [x ] Ventilator Chg in renal function: [ ]  none, [x ] Inc. Cr > 0.5, [x ] Temp. Dialysis, [x ] Permanent dialysis Stroke: [x ] None, [ ]  Minor, [ ]  Major Return to OR: Yes  Reason for return to OR: [ ]  Bleeding, [ ]  Infection, [ ]  Thrombosis, [ ]  Revision [x]  HD catheter placement  Discharge medications: Statin use:  Yes ASA use:  No  for medical reason   Plavix use:  No  for medical reason   Beta blocker use: Yes Coumadin use: No  for medical reason

## 2019-09-03 NOTE — Progress Notes (Signed)
[facility] NEPHROLOG[facility] PROGRESS NOTE  Assessment/ Plan: Pt is a 53 y.o. yo male  W/ a PMH of DM, HTN, HLD, thrombocytopenia, PAD s/p arortogram, iliac stenting, and fem-pop bypass surgery on 10/23, who developed oliguric AKI requiring CRRT from 10/18 -10/21 and has been receiving intermittent HD since 10/24.   # AKI - likely 2/2 ischemic ATN in the setting of hypotension and acute blood loss anemia concomitant with contrast nephropathy. Patient put out 963ml recorded UOP over past 24 hrs.  Cr improving, BUN stable. Will continue to hold HD  as kidney status appears to be improving.  - keep temporary IJ catheter.  # HTN: on labetalol and nifedipine.  Upper normotensive level currently. Continue to monitor.  # hypokalemia/hypomagnesemia: potassium lower normal range today, mg low. Continue to replete as needed.  # acute blood loss anemia: required transfusoin.  Iron sat 44%, continue ESA.  Monitor hgb # acute encephalopathy: alert and oriented today.  Continue to monitor #thrombocytopenia: platelet count stable.   Subjective:  Patient feeling well today.  No CP, no SOB, no abdominal pain.  Patient states he is having pain in his posterior.   Objective General:NAD, comfortable. Sitting in bed, eating breakfast.  Heart:R2/6 crescendo-decrescendo systolic murmur.  Lungs:clear b/l, no crackle Abdomen:soft, Non-tender, non-distended Extremities:No edema Dialysis Access: right IJ cath  Vital signs in last 24 hours: Vitals:   09/02/19 1950 09/03/19 0209 09/03/19 0354 09/03/19 0402  BP: (!) 156/58 (!) 140/47 (!) 142/33   Pulse:  100 95   Resp: (!) 29 20 18    Temp:  99.6 F (37.6 C) 99.2 F (37.3 C)   TempSrc:  Oral Oral   SpO2:  100% 98%   Weight:    87.4 kg  Height:       Weight change: -0.2 kg  Intake/Output Summary (Last 24 hours) at 09/03/2019 0740 Last data filed at 09/03/2019 0400 Gross per 24 hour  Intake 1150 ml  Output 2280 ml  Net -1130 ml        Labs: Basic Metabolic Panel: Recent Labs  Lab 09/02/19 0243 09/02/19 0715 09/03/19 0432  NA 139 138 135  K 3.9 3.8 3.6  CL 110 110 106  CO2 22 21* 21*  GLUCOSE 163* 199* 139*  BUN 43* 44* 45*  CREATININE 2.95* 2.79* 2.65*  CALCIUM 7.8* 7.9* 7.8*  PHOS 3.3 3.2 3.0   Liver Function Tests: Recent Labs  Lab 08/28/19 0316 08/29/19 0411  09/02/19 0243 09/02/19 0715 09/03/19 0432  AST 166* 113*  --   --   --   --   ALT 83* 65*  --   --   --   --   ALKPHOS 92 94  --   --   --   --   BILITOT 2.4* 2.2*  --   --   --   --   PROT 7.6 7.3  --   --   --   --   ALBUMIN 1.8* 1.7*   < > 1.6* 1.6* 1.5*   < > = values in this interval not displayed.   No results for input(s): LIPASE, AMYLASE in the last 168 hours. Recent Labs  Lab 08/29/19 0411 08/30/19 0528 09/01/19 0500  AMMONIA 67* 60* 41*   CBC: Recent Labs  Lab 08/29/19 0411 08/30/19 0528 09/01/19 0500 09/02/19 0715  WBC 24.2* 14.7* 8.9 7.3  HGB 8.3* 7.6* 7.2* 7.2*  HCT 27.5* 24.7* 24.1* 25.1*  MCV 96.8 94.3 97.6 101.2*  PLT 45*  46* 49* 46*   Cardiac Enzymes: No results for input(s): CKTOTAL, CKMB, CKMBINDEX, TROPONINI in the last 168 hours. CBG: Recent Labs  Lab 09/02/19 1155 09/02/19 1557 09/02/19 2042 09/03/19 0044 09/03/19 0401  GLUCAP 269* 221* 184* 167* 145*    Iron Studies:  Recent Labs    09/02/19 0403  IRON 90  TIBC 203*  FERRITIN 300   Studies/Results: No results found.  Medications: Infusions: . sodium chloride Stopped (08/22/19 1311)  . sodium chloride 35 mL (08/22/19 2239)  . sodium chloride    . sodium chloride    . magnesium sulfate bolus IVPB      Scheduled Medications: . atorvastatin  80 mg Oral Daily  . Chlorhexidine Gluconate Cloth  6 each Topical Daily  . Chlorhexidine Gluconate Cloth  6 each Topical Q0600  . Chlorhexidine Gluconate Cloth  6 each Topical Q0600  . darbepoetin (ARANESP) injection - NON-DIALYSIS  150 mcg Subcutaneous Q Mon-1800  . folic acid   1 mg Oral Daily  . Gerhardt's butt cream   Topical Daily  . heparin injection (subcutaneous)  5,000 Units Subcutaneous Q8H  . insulin aspart  0-9 Units Subcutaneous Q4H  . insulin glargine  15 Units Subcutaneous QPM  . labetalol  300 mg Oral BID  . lactulose  30 g Oral TID  . NIFEdipine  90 mg Oral Daily  . pantoprazole  40 mg Oral Daily  . sodium chloride flush  10-40 mL Intracatheter Q12H  . sodium chloride flush  3 mL Intravenous Q12H  . thiamine  100 mg Oral Daily  . vitamin B-12  1,000 mcg Oral Daily    have reviewed scheduled and prn medications.   Edward Rocha 09/03/2019,7:40 AM  LOS: 23 days  Pager: [phone]

## 2019-09-04 LAB — GLUCOSE, CAPILLARY
Glucose-Capillary: 161 mg/dL — ABNORMAL HIGH (ref 70–99)
Glucose-Capillary: 151 mg/dL — ABNORMAL HIGH (ref 70–99)
Glucose-Capillary: 160 mg/dL — ABNORMAL HIGH (ref 70–99)
Glucose-Capillary: 171 mg/dL — ABNORMAL HIGH (ref 70–99)
Glucose-Capillary: 160 mg/dL — ABNORMAL HIGH (ref 70–99)

## 2019-09-04 LAB — RENAL FUNCTION PANEL
Albumin: 2.2 g/dL — ABNORMAL LOW (ref 3.5–5.0)
Anion gap: 10 (ref 5–15)
BUN: 45 mg/dL — ABNORMAL HIGH (ref 6–20)
CO2: 19 mmol/L — ABNORMAL LOW (ref 22–32)
Calcium: 8.2 mg/dL — ABNORMAL LOW (ref 8.9–10.3)
Chloride: 104 mmol/L (ref 98–111)
Creatinine, Ser: 2.89 mg/dL — ABNORMAL HIGH (ref 0.61–1.24)
GFR calc Af Amer: 27 mL/min — ABNORMAL LOW (ref >60)
GFR calc non Af Amer: 23 mL/min — ABNORMAL LOW (ref >60)
Glucose, Bld: 175 mg/dL — ABNORMAL HIGH (ref 70–99)
Phosphorus: 3.9 mg/dL (ref 2.5–4.6)
Potassium: 3.3 mmol/L — ABNORMAL LOW (ref 3.5–5.1)
Sodium: 133 mmol/L — ABNORMAL LOW (ref 135–145)

## 2019-09-04 LAB — MISC LABCORP TEST (SEND OUT): Labcorp test code: 182261

## 2019-09-04 LAB — MAGNESIUM: Magnesium: 1.6 mg/dL — ABNORMAL LOW (ref 1.7–2.4)

## 2019-09-04 MED ORDER — LACTULOSE 10 GM/15ML PO SOLN
30.0000 g | Freq: Every day | ORAL | Status: DC
  Administered 2019-09-04: 30 g via ORAL
  Filled 2019-09-04: qty 45

## 2019-09-04 MED ORDER — POTASSIUM CHLORIDE CRYS ER 20 MEQ PO TBCR
20.0000 meq | EXTENDED_RELEASE_TABLET | Freq: Once | ORAL | Status: AC
  Administered 2019-09-04: 13:00:00 20 meq via ORAL
  Filled 2019-09-04: qty 1

## 2019-09-04 NOTE — Progress Notes (Signed)
Bleeding from his gum, oral mucosal, lips  and tongue. Cleaned and Lip moisturizer applied. Denied pain.  Surgical wound left leg and right groin open to air, dry clean and intact, no bleeding, negative for hematoma on right groin area. Lower extremities had patent  Dorsalis pedis and posterior Tibial pulses signal by doppler.      Pt requested to get up and walk in hall way. He was able to walk with walker with one staff standby assisted from his bed to the door. He tolerated well ,but  watery bowel movement involuntarily leaking happened!. He continued having watery bowel movement tonight.   Vital signs stable, remained afebrile. EKG Sinus rhythm on monitor, HR 90s., BP 151/55 mmHg, Lungs clear. On room air SPO2 93-94% No acute dis tress note. Continue to monitor.  Kennyth Lose, RN

## 2019-09-04 NOTE — Plan of Care (Signed)
Continue to monitor

## 2019-09-04 NOTE — Progress Notes (Addendum)
[facility] [place]  Subjective  - Doing better each day.   Objective (!) 151/55 95 98.4 F (36.9 C) (Oral) (!) 23 93%  Intake/Output Summary (Last 24 hours) at 09/04/2019 0729 Last data filed at 09/04/2019 0400 Gross per 24 hour  Intake 632.49 ml  Output 2400 ml  Net -1767.51 ml   Left LE doppler PT/DP intact Right IJ in place Lungs non labored  Gen NAD     Assessment/Planning: 53 y.o.maleis s/p L femoral to popliteal bypass and iliac stenting  Patent left LE bypass Diarrhea secondary to Lactulose will try to change to QD and check ammonia level Cr increased now 2.89 from 2.65 UO  2400 last 24 hours will continue to monitor with Nephrology.  AKI requiring CRRT from 10/18-10/21. Will draw labs in the am. Pending CIR   [name] 09/04/2019 7:29 AM --  Laboratory Lab Results: Recent Labs    09/02/19 0715  WBC 7.3  HGB 7.2*  HCT 25.1*  PLT 46*   BMET Recent Labs    09/03/19 0432 09/04/19 0423  NA 135 133*  K 3.6 3.3*  CL 106 104  CO2 21* 19*  GLUCOSE 139* 175*  BUN 45* 45*  CREATININE 2.65* 2.89*  CALCIUM 7.8* 8.2*    COAG Lab Results  Component Value Date   INR 1.7 (H) 08/27/2019   INR 1.4 (H) 08/22/2019   INR 1.5 (H) 08/22/2019   No results found for: PTT   I have seen and evaluated the patient. I agree with the PA note as documented above. He is status post left common femoral to posterior tibial bypass with composite PTFE nonreversed saphenous vein bypass on 08/11/2019.  Complicated postop course with renal failure and volume overload in ICU requiring CRRT as well as encephalopathy likely due to underlying cirrhosis.  Now is very alert and appropriate.  He has an excellent PT signal in the left foot at the ankle.  All of incisions are healing.  I continue to keep a dry dressing in the left groin. CIR consulted for placement and have started insurance approval process.  Has a temporary right IJ catheter in  place but is now making urine showing some signs of renal recovery. Continuing to observe for now.  Diarrhea yesterday while walking with PT and will decrease lactulose to 1x/daily.    [name], MD [facility] [place] Office: [phone] Pager: [phone]

## 2019-09-04 NOTE — Progress Notes (Signed)
[facility] NEPHR[facility] PROGRESS NOTE  Assessment/ Plan: Pt is a 53 y.o. yo male  W/ a PMH of DM, HTN, HLD, thrombocytopenia, PAD s/p arortogram, iliac stenting, and fem-pop bypass surgery on 10/23, who developed oliguric AKI requiring CRRT from 10/18 -10/21 and has been receiving intermittent HD since 10/24.   # AKI - likely 2/2 ischemic ATN in the setting of hypotension and acute blood loss anemia concomitant with contrast nephropathy. Patient put out 24106mlrecorded UOP over past 24 hrs, increased from 938ml the day before. Slight increase in Cr with decrease in K and Mg. BUN stable at 45. Will continue to hold HD as kidney status appears to be improving as evidenced by his increase in urine output. Will reassess each day for HD need. - keep temporary IJ catheter.   # HTN: on labetalol and nifedipine.  Upper normotensive level currently. Continue to monitor.   # hypokalemia/hypomagnesemia: potassium 3.3  today, mg low. Pt has standing PRN order for 2g Mg for levels < 1.7.  Does not appear to have been given Mg yesterday for value of 1.6.  Will give Mg and K today. Continue to replete as needed.   # acute blood loss anemia: required transfusoin.  Iron sat 44%, continue ESA.  Hgb slowly downtrending. 7.2 on previous two days. Nurse reports mucosal bleeding overnight.  Monitor hgb.   # acute encephalopathy: alert and oriented today.  Continue to monitor  #thrombocytopenia: platelet count stable, 46K on 11/3.    Subjective:  Pt states he feels well, does not have any complaints. States he walked to the door yesterday and his rectal tube was removed. He would like to clean himself up before his wife comes today.  Objective General:NAD, comfortable. HEENT: some dried blood on bottom lip and tongue. No active bleeding seen.  Heart:R2/6 crescendo-decrescendo systolic murmur.  Lungs:clear b/l, no crackles Abdomen:soft, Non-tender, non-distended Extremities:slight 1+ edema on the  LLE, trace edema on the RLE.  Dialysis Access: right IJ cath  Vital signs in last 24 hours: Vitals:   09/03/19 2320 09/03/19 2336 09/04/19 0356 09/04/19 0400  BP: (!) 146/56  (!) 151/55 (!) 151/55  Pulse: 95 94 96 95  Resp: (!) 30 (!) 22 (!) 24 (!) 23  Temp: 98.5 F (36.9 C)  98.4 F (36.9 C)   TempSrc: Oral  Oral   SpO2: 93% 93% 94% 93%  Weight:   87.2 kg   Height:       Weight change: -0.2 kg  Intake/Output Summary (Last 24 hours) at 09/04/2019 0719 Last data filed at 09/04/2019 0400 Gross per 24 hour  Intake 632.49 ml  Output 2400 ml  Net -1767.51 ml    Labs: Basic Metabolic Panel: Recent Labs  Lab 09/02/19 0715 09/03/19 0432 09/04/19 0423  NA 138 135 133*  K 3.8 3.6 3.3*  CL 110 106 104  CO2 21* 21* 19*  GLUCOSE 199* 139* 175*  BUN 44* 45* 45*  CREATININE 2.79* 2.65* 2.89*  CALCIUM 7.9* 7.8* 8.2*  PHOS 3.2 3.0 3.9   Liver Function Tests: Recent Labs  Lab 08/29/19 0411  09/02/19 0715 09/03/19 0432 09/04/19 0423  AST 113*  --   --   --   --   ALT 65*  --   --   --   --   ALKPHOS 94  --   --   --   --   BILITOT 2.2*  --   --   --   --  PROT 7.3  --   --   --   --   ALBUMIN 1.7*   < > 1.6* 1.5* 2.2*   < > = values in this interval not displayed.   No results for input(s): LIPASE, AMYLASE in the last 168 hours. Recent Labs  Lab 08/30/19 0528 09/01/19 0500 09/03/19 0848  AMMONIA 60* 41* 39*   CBC: Recent Labs  Lab 08/29/19 0411 08/30/19 0528 09/01/19 0500 09/02/19 0715  WBC 24.2* 14.7* 8.9 7.3  HGB 8.3* 7.6* 7.2* 7.2*  HCT 27.5* 24.7* 24.1* 25.1*  MCV 96.8 94.3 97.6 101.2*  PLT 45* 46* 49* 46*   Cardiac Enzymes: No results for input(s): CKTOTAL, CKMB, CKMBINDEX, TROPONINI in the last 168 hours. CBG: Recent Labs  Lab 09/03/19 1203 09/03/19 1608 09/03/19 2018 09/03/19 2326 09/04/19 0454  GLUCAP 192* 180* 214* 196* 161*    Iron Studies:  Recent Labs    09/02/19 0403  IRON 90  TIBC 203*  FERRITIN 300   Studies/Results: No  results found.  Medications: Infusions: . sodium chloride Stopped (08/22/19 1311)  . sodium chloride 35 mL (08/22/19 2239)  . sodium chloride    . sodium chloride    . magnesium sulfate bolus IVPB      Scheduled Medications: . atorvastatin  80 mg Oral Daily  . Chlorhexidine Gluconate Cloth  6 each Topical Daily  . Chlorhexidine Gluconate Cloth  6 each Topical Q0600  . Chlorhexidine Gluconate Cloth  6 each Topical Q0600  . darbepoetin (ARANESP) injection - NON-DIALYSIS  150 mcg Subcutaneous Q Mon-1800  . folic acid  1 mg Oral Daily  . Gerhardt's butt cream   Topical Daily  . heparin injection (subcutaneous)  5,000 Units Subcutaneous Q8H  . insulin aspart  0-9 Units Subcutaneous Q4H  . insulin glargine  15 Units Subcutaneous QPM  . labetalol  300 mg Oral BID  . lactulose  30 g Oral TID  . multivitamin with minerals  1 tablet Oral Daily  . NIFEdipine  90 mg Oral Daily  . pantoprazole  40 mg Oral Daily  . sodium chloride flush  10-40 mL Intracatheter Q12H  . sodium chloride flush  3 mL Intravenous Q12H  . thiamine  100 mg Oral Daily  . vitamin B-12  1,000 mcg Oral Daily    have reviewed scheduled and prn medications.   Benay Pike 09/04/2019,7:19 AM  LOS: 24 days

## 2019-09-05 LAB — RENAL FUNCTION PANEL
Albumin: 2 g/dL — ABNORMAL LOW (ref 3.5–5.0)
Anion gap: 10 (ref 5–15)
BUN: 46 mg/dL — ABNORMAL HIGH (ref 6–20)
CO2: 19 mmol/L — ABNORMAL LOW (ref 22–32)
Calcium: 8.2 mg/dL — ABNORMAL LOW (ref 8.9–10.3)
Chloride: 103 mmol/L (ref 98–111)
Creatinine, Ser: 3.44 mg/dL — ABNORMAL HIGH (ref 0.61–1.24)
GFR calc Af Amer: 22 mL/min — ABNORMAL LOW (ref >60)
GFR calc non Af Amer: 19 mL/min — ABNORMAL LOW (ref >60)
Glucose, Bld: 138 mg/dL — ABNORMAL HIGH (ref 70–99)
Phosphorus: 3.9 mg/dL (ref 2.5–4.6)
Potassium: 3.3 mmol/L — ABNORMAL LOW (ref 3.5–5.1)
Sodium: 132 mmol/L — ABNORMAL LOW (ref 135–145)

## 2019-09-05 LAB — GLUCOSE, CAPILLARY
Glucose-Capillary: 128 mg/dL — ABNORMAL HIGH (ref 70–99)
Glucose-Capillary: 164 mg/dL — ABNORMAL HIGH (ref 70–99)
Glucose-Capillary: 171 mg/dL — ABNORMAL HIGH (ref 70–99)
Glucose-Capillary: 174 mg/dL — ABNORMAL HIGH (ref 70–99)
Glucose-Capillary: 189 mg/dL — ABNORMAL HIGH (ref 70–99)

## 2019-09-05 LAB — CBC
HCT: 23.8 % — ABNORMAL LOW (ref 39.0–52.0)
Hemoglobin: 7.1 g/dL — ABNORMAL LOW (ref 13.0–17.0)
MCH: 28.6 pg (ref 26.0–34.0)
MCHC: 29.8 g/dL — ABNORMAL LOW (ref 30.0–36.0)
MCV: 96 fL (ref 80.0–100.0)
Platelets: 53 10*3/uL — ABNORMAL LOW (ref 150–400)
RBC: 2.48 MIL/uL — ABNORMAL LOW (ref 4.22–5.81)
RDW: 19.1 % — ABNORMAL HIGH (ref 11.5–15.5)
WBC: 7.2 10*3/uL (ref 4.0–10.5)
nRBC: 0.4 % — ABNORMAL HIGH (ref 0.0–0.2)

## 2019-09-05 LAB — MAGNESIUM: Magnesium: 2 mg/dL (ref 1.7–2.4)

## 2019-09-05 LAB — AMMONIA: Ammonia: 42 umol/L — ABNORMAL HIGH (ref 9–35)

## 2019-09-05 MED ORDER — FLUCONAZOLE 50 MG PO TABS
50.0000 mg | ORAL_TABLET | Freq: Every day | ORAL | Status: DC
  Administered 2019-09-06 – 2019-09-10 (×4): 50 mg via ORAL
  Filled 2019-09-05 (×5): qty 1

## 2019-09-05 MED ORDER — LACTULOSE 10 GM/15ML PO SOLN
30.0000 g | Freq: Two times a day (BID) | ORAL | Status: DC
  Administered 2019-09-05 – 2019-09-08 (×5): 30 g via ORAL
  Filled 2019-09-05 (×7): qty 45

## 2019-09-05 MED ORDER — FLUCONAZOLE 100 MG PO TABS
100.0000 mg | ORAL_TABLET | Freq: Once | ORAL | Status: AC
  Administered 2019-09-05: 100 mg via ORAL
  Filled 2019-09-05: qty 1

## 2019-09-05 MED ORDER — CHLORHEXIDINE GLUCONATE CLOTH 2 % EX PADS: 6.0000 | MEDICATED_PAD | Freq: Every day | CUTANEOUS | Status: DC

## 2019-09-05 MED ORDER — HEPARIN SODIUM (PORCINE) 1000 UNIT/ML IJ SOLN
INTRAMUSCULAR | Status: AC
  Administered 2019-09-05: 2800 [IU] via INTRAVENOUS_CENTRAL
  Filled 2019-09-05: qty 4

## 2019-09-05 MED ORDER — POTASSIUM CHLORIDE CRYS ER 20 MEQ PO TBCR
20.0000 meq | EXTENDED_RELEASE_TABLET | Freq: Once | ORAL | Status: AC
  Administered 2019-09-05: 10:00:00 20 meq via ORAL
  Filled 2019-09-05: qty 1

## 2019-09-05 NOTE — Progress Notes (Signed)
Right leg incision at the knee the incision is open no drainage. Cleaned and applied gauze dressing.

## 2019-09-05 NOTE — Progress Notes (Addendum)
Patient in bathroom and had a nose bleed applied pressure.for 15 minutes. Then stop . Spoke with Pharm. And he suggested to hold this a.m. Heparin S.Q. til M.D. makes rounds. R.N. aware

## 2019-09-05 NOTE — Progress Notes (Signed)
Physical Therapy Treatment Patient Details Name: Edward Rocha. MRN: [id number] DOB: Feb 29, 1966 Today's Date: 09/05/2019    History of Present Illness 53 y.o. male is s/p L femoral to popliteal bypass and iliac stenting 10 Days Post-Op . Pt with 2 intubations this admission with recent extubation on 10/29, on CRRT until 10/26.      PT Comments    PT remains limited by fatigue during sessions, although tolerating increased ambulation distances this session than previously. Pt continues to require physical assistance to manage transfers due to LE power and balance deficits. Pt remains at an increased falls risk due to poor endurance, inhibiting his ability to perform functional activities and ADLs. Pt will benefit from continued acute PT services to improve activity tolerance and enhance independence in mobility. PT encourages patient to ambulate multiple times per day with assistance of nursing staff.  Follow Up Recommendations  CIR;Supervision/Assistance - 24 hour(HHPT if electing to D/C home)     Equipment Recommendations  Rolling walker with 5" wheels(RW if choosing to D/C home)    Recommendations for Other Services       Precautions / Restrictions Precautions Precautions: Fall Restrictions Weight Bearing Restrictions: No    Mobility  Bed Mobility Overal bed mobility: Modified Independent Bed Mobility: Supine to Sit;Sit to Supine;Rolling Rolling: Modified independent (Device/Increase time)   Supine to sit: Modified independent (Device/Increase time) Sit to supine: Modified independent (Device/Increase time)      Transfers Overall transfer level: Needs assistance Equipment used: Rolling walker (2 wheeled) Transfers: Sit to/from Stand Sit to Stand: Min assist            Ambulation/Gait Ambulation/Gait assistance: Min guard;Min assist(minA progressing to minG) Gait Distance (Feet): 60 Feet Assistive device: Rolling walker (2 wheeled) Gait Pattern/deviations:  Step-to pattern;Decreased step length - right;Decreased step length - left Gait velocity: reduced Gait velocity interpretation: <1.31 ft/sec, indicative of household ambulator General Gait Details: pt with short step to gait with significantly reduced gait speed. Pt taking multiple brief standing rest breaks due to fatigue.   Stairs             Wheelchair Mobility    Modified Rankin (Stroke Patients Only)       Balance Overall balance assessment: Needs assistance Sitting-balance support: Single extremity supported;Feet supported Sitting balance-Leahy Scale: Good Sitting balance - Comments: supervision of 1 person   Standing balance support: Bilateral upper extremity supported Standing balance-Leahy Scale: Fair Standing balance comment: minG for static standing balance with BUE support                            Cognition Arousal/Alertness: Awake/alert Behavior During Therapy: WFL for tasks assessed/performed Overall Cognitive Status: Within Functional Limits for tasks assessed                                        Exercises      General Comments General comments (skin integrity, edema, etc.): VSS during session on room air      Pertinent Vitals/Pain Pain Assessment: Faces Faces Pain Scale: Hurts little more Pain Location: buttocks Pain Descriptors / Indicators: Sore Pain Intervention(s): Limited activity within patient's tolerance    Home Living                      Prior Function  PT Goals (current goals can now be found in the care plan section) Acute Rehab PT Goals Patient Stated Goal: improve mobility Progress towards PT goals: Progressing toward goals    Frequency    Min 3X/week      PT Plan Current plan remains appropriate    Co-evaluation              AM-PAC PT "6 Clicks" Mobility   Outcome Measure  Help needed turning from your back to your side while in a flat bed without using  bedrails?: None Help needed moving from lying on your back to sitting on the side of a flat bed without using bedrails?: None Help needed moving to and from a bed to a chair (including a wheelchair)?: A Little Help needed standing up from a chair using your arms (e.g., wheelchair or bedside chair)?: A Little Help needed to walk in hospital room?: A Little Help needed climbing 3-5 steps with a railing? : A Lot 6 Click Score: 19    End of Session Equipment Utilized During Treatment: Gait belt Activity Tolerance: Patient limited by fatigue Patient left: in bed;with call bell/phone within reach Nurse Communication: Mobility status PT Visit Diagnosis: Muscle weakness (generalized) (M62.81) Pain - part of body: (buttocks)     Time: [id number] PT Time Calculation (min) (ACUTE ONLY): 17 min  Charges:  $Gait Training: 8-22 mins                     Zenaida Niece, PT, DPT Acute Rehabilitation Pager: [phone]    Zenaida Niece 09/05/2019, 11:07 AM

## 2019-09-05 NOTE — Progress Notes (Signed)
Pt received from Dialysis. VSS. Call bell in reach. Will continue to monitor.   Arletta Bale, RN

## 2019-09-05 NOTE — Progress Notes (Signed)
[facility] NEPHROLOGY PROGRESS NOTE  Assessment/ Plan: Pt is a 53 y.o. yo male  W/ a PMH of DM, HTN, HLD, thrombocytopenia, PAD s/p aortogram, iliac stenting, and fem-pop bypass surgery on 10/23, who developed oliguric AKI requiring CRRT from 10/18 -10/21 and has been receiving intermittent HD since 10/24.   # AKI - likely 2/2 ischemic ATN in the setting of hypotension and acute blood loss anemia concomitant with contrast nephropathy. Patient put out 375 UOP over past 24 hrs, which is a marked decrease from the day before. Cr has increased during past two days while BUN and electrolytes have remained stable. Kidney function appears to be worsening again after recent improvements. Has not had any hypotensive episodes, Hgb is low but stable.   - schedule hemodialysis for today given worsening of kidney function/oliguria. - keep temporary IJ catheter.   # HTN: on labetalol and nifedipine.  Slightly elevated currently. Continue to monitor.   # hypokalemia/hypomagnesemia: potassium 3.3  today, mg normal after repletion . Pt has standing PRN order for 2g Mg for levels < 1.7.  Continue to replete as needed.   # acute blood loss anemia: required transfusion.  Iron sat 44%, continue ESA.  Hgb stable ~7. Nurse reports epistaxis overnight. Heparin held this am.  Monitor hgb.   # acute encephalopathy: alert and oriented today.  Continue to monitor  #thrombocytopenia: platelet count stable, 46K on 11/3. Has been stable in this range since at least July. A cause does not appear to have been found, possibly ITP. Consider outpatient referral to heme.      Subjective:  Patient states his mouth is sore, and even water causes him pain when trying to drink.  He endorses some dyspnea consistent with previous days.   Objective General:NAD, comfortable. HEENT: dried blood seen on lips, gums, and tongue.    Heart:R2/6 crescendo-decrescendo systolic murmur.  Lungs:clear b/l, no crackles. Decreased  air movement.  Abdomen:soft, Non-tender, non-distended Extremities:slight 1+ edema on the LLE, no edema on the RLE.  Dialysis Access: right IJ cath  Vital signs in last 24 hours: Vitals:   09/04/19 1651 09/04/19 2020 09/05/19 0000 09/05/19 0406  BP: (!) 132/43 (!) 150/54 125/60 (!) 145/55  Pulse: 89 88 92   Resp: 18 11 (!) 22 (!) 26  Temp: 98.5 F (36.9 C) 98.5 F (36.9 C) 98.6 F (37 C) 98.5 F (36.9 C)  TempSrc: Oral Oral Oral Oral  SpO2: 97% 98% 96% 100%  Weight:    88.4 kg  Height:       Weight change: 1.161 kg  Intake/Output Summary (Last 24 hours) at 09/05/2019 0735 Last data filed at 09/05/2019 0424 Gross per 24 hour  Intake 240 ml  Output 375 ml  Net -135 ml    Labs: Basic Metabolic Panel: Recent Labs  Lab 09/03/19 0432 09/04/19 0423 09/05/19 0510  NA 135 133* 132*  K 3.6 3.3* 3.3*  CL 106 104 103  CO2 21* 19* 19*  GLUCOSE 139* 175* 138*  BUN 45* 45* 46*  CREATININE 2.65* 2.89* 3.44*  CALCIUM 7.8* 8.2* 8.2*  PHOS 3.0 3.9 3.9   Liver Function Tests: Recent Labs  Lab 09/03/19 0432 09/04/19 0423 09/05/19 0510  ALBUMIN 1.5* 2.2* 2.0*   No results for input(s): LIPASE, AMYLASE in the last 168 hours. Recent Labs  Lab 09/01/19 0500 09/03/19 0848 09/05/19 0510  AMMONIA 41* 39* 42*   CBC: Recent Labs  Lab 08/30/19 0528 09/01/19 0500 09/02/19 0715 09/05/19 0510  WBC  14.7* 8.9 7.3 7.2  HGB 7.6* 7.2* 7.2* 7.1*  HCT 24.7* 24.1* 25.1* 23.8*  MCV 94.3 97.6 101.2* 96.0  PLT 46* 49* 46* 53*   Cardiac Enzymes: No results for input(s): CKTOTAL, CKMB, CKMBINDEX, TROPONINI in the last 168 hours. CBG: Recent Labs  Lab 09/04/19 1203 09/04/19 1647 09/04/19 2017 09/04/19 2356 09/05/19 0403  GLUCAP 160* 171* 160* 171* 128*    Iron Studies:  No results for input(s): IRON, TIBC, TRANSFERRIN, FERRITIN in the last 72 hours. Studies/Results: No results found.  Medications: Infusions: . sodium chloride Stopped (08/22/19 1311)  . sodium chloride  35 mL (08/22/19 2239)  . sodium chloride    . sodium chloride      Scheduled Medications: . atorvastatin  80 mg Oral Daily  . Chlorhexidine Gluconate Cloth  6 each Topical Daily  . Chlorhexidine Gluconate Cloth  6 each Topical Q0600  . Chlorhexidine Gluconate Cloth  6 each Topical Q0600  . darbepoetin (ARANESP) injection - NON-DIALYSIS  150 mcg Subcutaneous Q Mon-1800  . folic acid  1 mg Oral Daily  . Gerhardt's butt cream   Topical Daily  . heparin injection (subcutaneous)  5,000 Units Subcutaneous Q8H  . insulin aspart  0-9 Units Subcutaneous Q4H  . insulin glargine  15 Units Subcutaneous QPM  . labetalol  300 mg Oral BID  . lactulose  30 g Oral BID  . multivitamin with minerals  1 tablet Oral Daily  . NIFEdipine  90 mg Oral Daily  . pantoprazole  40 mg Oral Daily  . sodium chloride flush  10-40 mL Intracatheter Q12H  . sodium chloride flush  3 mL Intravenous Q12H  . thiamine  100 mg Oral Daily  . vitamin B-12  1,000 mcg Oral Daily    have reviewed scheduled and prn medications.   Benay Pike 09/05/2019,7:35 AM  LOS: 25 days

## 2019-09-05 NOTE — Progress Notes (Addendum)
[facility]  Subjective  - Nose bleed this am pressure held for 15 min.   Objective (!) 145/55 92 98.5 F (36.9 C) (Oral) (!) 26 100%  Intake/Output Summary (Last 24 hours) at 09/05/2019 0719 Last data filed at 09/05/2019 0424 Gross per 24 hour  Intake 240 ml  Output 375 ml  Net -135 ml    Nose without bloody drainage Left LE doppler PT/DP intact Left groin with dry gauze no change at incision site Right IJ in place Lungs non labored  Gen NAD     Assessment/Planning: 53 y.o.maleis s/p L femoral to popliteal bypass and iliac stenting  Ammonia level increased will change Lactulose to BID from QD Multiple BM's reported.  Ambulation improving Cr elevated now 3.44 from 2.89.  UO decreased to 375 last 24 hours, previous 2,400. Right temp cath still maintained. HGB 7.1 asymptomatic acute blood loss anemia requiring 4 units PRBC and 1 unit of platelet pheresis.  Nose bleed stopped after 15 min of direct pressure around 4 am.  Heparin SQ was held this am. Hyponatremia and hypokalemia.  Will discuss oral verses IV K+ with Dr. Carlis Abbott if any benefits to add fluids.  A & O x 3  Pending CIR   [name] 09/05/2019 7:19 AM --  Laboratory Lab Results: Recent Labs    09/05/19 0510  WBC 7.2  HGB 7.1*  HCT 23.8*  PLT 53*   BMET Recent Labs    09/04/19 0423 09/05/19 0510  NA 133* 132*  K 3.3* 3.3*  CL 104 103  CO2 19* 19*  GLUCOSE 175* 138*  BUN 45* 46*  CREATININE 2.89* 3.44*  CALCIUM 8.2* 8.2*    COAG Lab Results  Component Value Date   INR 1.7 (H) 08/27/2019   INR 1.4 (H) 08/22/2019   INR 1.5 (H) 08/22/2019   No results found for: PTT  I have seen and evaluated the patient. I agree with the PA note as documented above. S/P left fem PT composite bypass.  Excellent left PT signal.  Wounds healing.  Complicated post-op course with encephalopathy, nose bleed, AKI requiring CRRT.  Will require HD again today given  rising Cr and some dyspnea.  No dialysis since end of [place].  Discussed with Dr. Carolin Sicks and will post for tunneled dialysis catheter next Tuesday with Dr. Donzetta Matters.  Awaiting CIR input but they will follow-up Monday after dialysis today while his renal function is monitored through the weekend.   [name], MD [facility] Office: [phone] Pager: [phone]

## 2019-09-05 NOTE — Progress Notes (Signed)
Inpatient Rehabilitation Admissions Coordinator  I contacted Dr. Carolin Sicks this morning to clarify renal issues. Patient to receive Hemodialysis today. I will follow up on Monday.  Danne Baxter, RN, MSN Rehab Admissions Coordinator [phone] 09/05/2019 9:23 AM

## 2019-09-05 NOTE — Progress Notes (Signed)
OT Cancellation Note  Patient Details Name: Edward Rocha. MRN: [id number] DOB: [birth year]   Cancelled Treatment:    Reason Eval/Treat Not Completed: Patient at procedure or test/ unavailable;Other (comment) Pt off unit at HD, will check back as time allows.  Lanier Clam., COTA/L Acute Rehabilitation Services [phone] [place] 09/05/2019, 4:06 PM

## 2019-09-05 NOTE — Progress Notes (Signed)
Patient off unit to dialysis at this time.  

## 2019-09-06 LAB — RENAL FUNCTION PANEL
Albumin: 1.9 g/dL — ABNORMAL LOW (ref 3.5–5.0)
Anion gap: 10 (ref 5–15)
BUN: 20 mg/dL (ref 6–20)
CO2: 22 mmol/L (ref 22–32)
Calcium: 7.5 mg/dL — ABNORMAL LOW (ref 8.9–10.3)
Chloride: 99 mmol/L (ref 98–111)
Creatinine, Ser: 3.31 mg/dL — ABNORMAL HIGH (ref 0.61–1.24)
GFR calc Af Amer: 23 mL/min — ABNORMAL LOW (ref >60)
GFR calc non Af Amer: 20 mL/min — ABNORMAL LOW (ref >60)
Glucose, Bld: 121 mg/dL — ABNORMAL HIGH (ref 70–99)
Phosphorus: 2.5 mg/dL (ref 2.5–4.6)
Potassium: 3.6 mmol/L (ref 3.5–5.1)
Sodium: 131 mmol/L — ABNORMAL LOW (ref 135–145)

## 2019-09-06 LAB — GLUCOSE, CAPILLARY
Glucose-Capillary: 116 mg/dL — ABNORMAL HIGH (ref 70–99)
Glucose-Capillary: 123 mg/dL — ABNORMAL HIGH (ref 70–99)
Glucose-Capillary: 132 mg/dL — ABNORMAL HIGH (ref 70–99)
Glucose-Capillary: 139 mg/dL — ABNORMAL HIGH (ref 70–99)
Glucose-Capillary: 159 mg/dL — ABNORMAL HIGH (ref 70–99)
Glucose-Capillary: 176 mg/dL — ABNORMAL HIGH (ref 70–99)

## 2019-09-06 LAB — MAGNESIUM: Magnesium: 1.8 mg/dL (ref 1.7–2.4)

## 2019-09-06 NOTE — Progress Notes (Signed)
[facility] NEPHROLOGY PROGRESS NOTE  Assessment/ Plan: Pt is a 53 y.o. yo male  W/ a PMH of DM, HTN, HLD, thrombocytopenia, PAD s/p aortogram, iliac stenting, and fem-pop bypass surgery on 10/23, who developed oliguric AKI requiring CRRT from 10/18 -10/21 and has been receiving intermittent HD since 10/24.   # AKI - likely 2/2 ischemic ATN in the setting of hypotension and acute blood loss anemia concomitant with contrast nephropathy. 621mL UOP yesterday. Cr improved today to 3.31. received dialysis yesterday. - keep temporary IJ catheter consider placing [facility] Monday if requiring further dialysis.   # HTN: on labetalol and nifedipine.  Slightly elevated currently. Continue to monitor.   # hypokalemia/hypomagnesemia: potassium 3.6  today, mg 1.8.  - PRN order for 2g Mg for levels < 1.7.    # acute blood loss anemia: required transfusion.  Iron sat 44%, continue ESA.  Hgb stable 7.1 yesterday. - recommend repeat CBC today  # acute encephalopathy: alert and oriented today.  Continue to monitor  #thrombocytopenia: platelet count stable, 46K on 11/3. Has been stable in this range since at least July. A cause does not appear to have been found, possibly ITP. Consider outpatient referral to heme.      Subjective:   Patient states that he is doing well overall. 100% better than when he came to the hospital. Denies N/V/abdominal pain/SOB. Says his catheter is irritating and interfering with his sleep.  Objective General:NAD, comfortable. HEENT: dried blood seen on lips, gums, and tongue.    Heart:R2/6 crescendo-decrescendo systolic murmur.  Lungs:clear b/l, no crackles. Decreased air movement.  Abdomen:soft, Non-tender, non-distended Extremities: trace edema on the RLE, 3+ edema on LLE  Dialysis Access: right IJ cath without signs of infection  Vital signs in last 24 hours: Vitals:   09/05/19 1946 09/05/19 2340 09/06/19 0310 09/06/19 0313  BP: (!) 153/52 (!) 121/53  (!)  124/54  Pulse: (!) 101 90  88  Resp: 18 (!) 26  (!) 31  Temp: 99.4 F (37.4 C) 99 F (37.2 C)  98.6 F (37 C)  TempSrc: Oral Oral  Oral  SpO2: 100% 94%  99%  Weight:   87.7 kg   Height:       Weight change: 0.439 kg  Intake/Output Summary (Last 24 hours) at 09/06/2019 0729 Last data filed at 09/05/2019 1900 Gross per 24 hour  Intake 360 ml  Output 635 ml  Net -275 ml    Labs: Basic Metabolic Panel: Recent Labs  Lab 09/04/19 0423 09/05/19 0510 09/06/19 0504  NA 133* 132* 131*  K 3.3* 3.3* 3.6  CL 104 103 99  CO2 19* 19* 22  GLUCOSE 175* 138* 121*  BUN 45* 46* 20  CREATININE 2.89* 3.44* 3.31*  CALCIUM 8.2* 8.2* 7.5*  PHOS 3.9 3.9 2.5   Liver Function Tests: Recent Labs  Lab 09/04/19 0423 09/05/19 0510 09/06/19 0504  ALBUMIN 2.2* 2.0* 1.9*   No results for input(s): LIPASE, AMYLASE in the last 168 hours. Recent Labs  Lab 09/01/19 0500 09/03/19 0848 09/05/19 0510  AMMONIA 41* 39* 42*   CBC: Recent Labs  Lab 09/01/19 0500 09/02/19 0715 09/05/19 0510  WBC 8.9 7.3 7.2  HGB 7.2* 7.2* 7.1*  HCT 24.1* 25.1* 23.8*  MCV 97.6 101.2* 96.0  PLT 49* 46* 53*   CBG: Recent Labs  Lab 09/05/19 0823 09/05/19 1201 09/05/19 1942 09/05/19 2337 09/06/19 0435  GLUCAP 174* 164* 189* 139* 123*    Medications: Infusions: . sodium chloride Stopped (08/22/19  1311)  . sodium chloride 35 mL (08/22/19 2239)    Scheduled Medications: . atorvastatin  80 mg Oral Daily  . Chlorhexidine Gluconate Cloth  6 each Topical Daily  . Chlorhexidine Gluconate Cloth  6 each Topical Q0600  . Chlorhexidine Gluconate Cloth  6 each Topical Q0600  . Chlorhexidine Gluconate Cloth  6 each Topical Q0600  . darbepoetin (ARANESP) injection - NON-DIALYSIS  150 mcg Subcutaneous Q Mon-1800  . fluconazole  50 mg Oral Daily  . folic acid  1 mg Oral Daily  . Gerhardt's butt cream   Topical Daily  . heparin injection (subcutaneous)  5,000 Units Subcutaneous Q8H  . insulin aspart  0-9 Units  Subcutaneous Q4H  . insulin glargine  15 Units Subcutaneous QPM  . labetalol  300 mg Oral BID  . lactulose  30 g Oral BID  . multivitamin with minerals  1 tablet Oral Daily  . NIFEdipine  90 mg Oral Daily  . pantoprazole  40 mg Oral Daily  . sodium chloride flush  10-40 mL Intracatheter Q12H  . sodium chloride flush  3 mL Intravenous Q12H  . thiamine  100 mg Oral Daily  . vitamin B-12  1,000 mcg Oral Daily    have reviewed scheduled and prn medications.   [place] 09/06/2019,7:29 AM  LOS: 26 days

## 2019-09-06 NOTE — Progress Notes (Signed)
Occupational Therapy Treatment Patient Details Name: Edward Rocha. MRN: [id number] DOB: Apr 10, 1966 Today's Date: 09/06/2019    History of present illness 53 y.o. male is s/p L femoral to popliteal bypass and iliac stenting 10 Days Post-Op . Pt with 2 intubations this admission with recent extubation on 10/29, on CRRT until 10/26.     OT comments  Pt making progress with functional goals. Pt is mod I with be mobility to sit EOB, min A with sit - stand to RW to ambulate x 3 ft to [facility]. Pt simulated LB dressing mod - min A. OT will continue to follow acutely  Follow Up Recommendations  CIR;Supervision/Assistance - 24 hour    Equipment Recommendations  Other (comment)(TBD at next venue of care)    Recommendations for Other Services      Precautions / Restrictions Precautions Precautions: Fall Restrictions Weight Bearing Restrictions: No       Mobility Bed Mobility Overal bed mobility: Modified Independent       Supine to sit: Modified independent (Device/Increase time) Sit to supine: Modified independent (Device/Increase time)      Transfers Overall transfer level: Needs assistance Equipment used: Rolling walker (2 wheeled) Transfers: Sit to/from Stand Sit to Stand: Min assist              Balance Overall balance assessment: Needs assistance Sitting-balance support: Single extremity supported;Feet supported Sitting balance-Leahy Scale: Good     Standing balance support: Bilateral upper extremity supported;During functional activity Standing balance-Leahy Scale: Fair                             ADL either performed or assessed with clinical judgement   ADL Overall ADL's : Needs assistance/impaired Eating/Feeding: Independent;Sitting   Grooming: Wash/dry hands;Wash/dry face;Min guard;Standing               Lower Body Dressing: Moderate assistance;Minimal assistance;Sit to/from stand   Toilet Transfer: Minimal  assistance;Ambulation;RW;BSC;Cueing for safety   Toileting- Clothing Manipulation and Hygiene: Moderate assistance;Sit to/from stand       Functional mobility during ADLs: Minimal assistance;Rolling walker;Cueing for safety       Vision Baseline Vision/History: Wears glasses Patient Visual Report: No change from baseline     Perception     Praxis      Cognition Arousal/Alertness: Awake/alert Behavior During Therapy: WFL for tasks assessed/performed Overall Cognitive Status: Within Functional Limits for tasks assessed                                          Exercises     Shoulder Instructions       General Comments      Pertinent Vitals/ Pain       Pain Assessment: 0-10 Pain Score: 5  Pain Location: buttocks, L LE surgical site Pain Descriptors / Indicators: Sore Pain Intervention(s): Limited activity within patient's tolerance;Monitored during session;Repositioned  Home Living                                          Prior Functioning/Environment              Frequency  Min 2X/week        Progress Toward Goals  OT Goals(current goals can now be found  in the care plan section)  Progress towards OT goals: Progressing toward goals     Plan Discharge plan remains appropriate    Co-evaluation                 AM-PAC OT "6 Clicks" Daily Activity     Outcome Measure   Help from another person eating meals?: None Help from another person taking care of personal grooming?: None Help from another person toileting, which includes using toliet, bedpan, or urinal?: A Little Help from another person bathing (including washing, rinsing, drying)?: A Little Help from another person to put on and taking off regular upper body clothing?: None Help from another person to put on and taking off regular lower body clothing?: A Lot 6 Click Score: 20    End of Session Equipment Utilized During Treatment: Gait  belt;Rolling walker;Other (comment)(BSC)  OT Visit Diagnosis: Other abnormalities of gait and mobility (R26.89);Pain;Other symptoms and signs involving cognitive function;Muscle weakness (generalized) (M62.81) Pain - Right/Left: Left Pain - part of body: Leg(buttocks)   Activity Tolerance Patient tolerated treatment well   Patient Left with call bell/phone within reach;in bed   Nurse Communication          Time: [id number] OT Time Calculation (min): 19 min  Charges: OT General Charges $OT Visit: 1 Visit OT Treatments $Self Care/Home Management : 8-22 mins     Emmit Alexanders [facility] 09/06/2019, 2:14 PM

## 2019-09-06 NOTE — Progress Notes (Signed)
Patient ID: Edward Rocha., male   DOB: 06/26/1966, 53 y.o.   MRN: [id number]  Progress Note    09/06/2019 8:21 AM 15 Days Post-Op  Subjective: Comfortable this morning.   Vitals:   09/06/19 0313 09/06/19 0800  BP: (!) 124/54 (!) 146/114  Pulse: 88 95  Resp: (!) 31 14  Temp: 98.6 F (37 C) 99 F (37.2 C)  SpO2: 99% 95%   Physical Exam: Left leg surgical incisions healing with good flow to left foot.  CBC    Component Value Date/Time   WBC 7.2 09/05/2019 0510   RBC 2.48 (L) 09/05/2019 0510   HGB 7.1 (L) 09/05/2019 0510   HCT 23.8 (L) 09/05/2019 0510   PLT 53 (L) 09/05/2019 0510   MCV 96.0 09/05/2019 0510   MCH 28.6 09/05/2019 0510   MCHC 29.8 (L) 09/05/2019 0510   RDW 19.1 (H) 09/05/2019 0510   LYMPHSABS 3.5 08/26/2019 0500   MONOABS 2.2 (H) 08/26/2019 0500   EOSABS 0.4 08/26/2019 0500   BASOSABS 0.1 08/26/2019 0500    BMET    Component Value Date/Time   NA 131 (L) 09/06/2019 0504   K 3.6 09/06/2019 0504   CL 99 09/06/2019 0504   CO2 22 09/06/2019 0504   GLUCOSE 121 (H) 09/06/2019 0504   BUN 20 09/06/2019 0504   CREATININE 3.31 (H) 09/06/2019 0504   CALCIUM 7.5 (L) 09/06/2019 0504   GFRNONAA 20 (L) 09/06/2019 0504   GFRAA 23 (L) 09/06/2019 0504    INR    Component Value Date/Time   INR 1.7 (H) 08/27/2019 0351     Intake/Output Summary (Last 24 hours) at 09/06/2019 0821 Last data filed at 09/05/2019 1900 Gross per 24 hour  Intake 360 ml  Output 635 ml  Net -275 ml     Assessment/Plan:  53 y.o. male status post left femoral to posterior composite bypass.  Postop complicated with renal failure, diminished platelets and issues regarding cirrhosis.  Creatinine today 3.31.  Renal following to determine need for ongoing hemodialysis.  Has temporary catheter right IJ     Rosetta Posner, MD FACS [facility] [phone] 09/06/2019 8:21 AM

## 2019-09-07 LAB — RENAL FUNCTION PANEL
Albumin: 1.9 g/dL — ABNORMAL LOW (ref 3.5–5.0)
Anion gap: 10 (ref 5–15)
BUN: 24 mg/dL — ABNORMAL HIGH (ref 6–20)
CO2: 22 mmol/L (ref 22–32)
Calcium: 7.8 mg/dL — ABNORMAL LOW (ref 8.9–10.3)
Chloride: 99 mmol/L (ref 98–111)
Creatinine, Ser: 5.01 mg/dL — ABNORMAL HIGH (ref 0.61–1.24)
GFR calc Af Amer: 14 mL/min — ABNORMAL LOW (ref >60)
GFR calc non Af Amer: 12 mL/min — ABNORMAL LOW (ref >60)
Glucose, Bld: 120 mg/dL — ABNORMAL HIGH (ref 70–99)
Phosphorus: 3.9 mg/dL (ref 2.5–4.6)
Potassium: 3.6 mmol/L (ref 3.5–5.1)
Sodium: 131 mmol/L — ABNORMAL LOW (ref 135–145)

## 2019-09-07 LAB — GLUCOSE, CAPILLARY
Glucose-Capillary: 124 mg/dL — ABNORMAL HIGH (ref 70–99)
Glucose-Capillary: 103 mg/dL — ABNORMAL HIGH (ref 70–99)
Glucose-Capillary: 128 mg/dL — ABNORMAL HIGH (ref 70–99)
Glucose-Capillary: 162 mg/dL — ABNORMAL HIGH (ref 70–99)
Glucose-Capillary: 158 mg/dL — ABNORMAL HIGH (ref 70–99)

## 2019-09-07 LAB — MAGNESIUM: Magnesium: 1.9 mg/dL (ref 1.7–2.4)

## 2019-09-07 MED ORDER — DARBEPOETIN ALFA 60 MCG/0.3ML IJ SOSY
60.0000 ug | PREFILLED_SYRINGE | INTRAMUSCULAR | Status: DC
  Administered 2019-09-08: 60 ug via INTRAVENOUS
  Filled 2019-09-07: qty 0.3

## 2019-09-07 MED ORDER — CHLORHEXIDINE GLUCONATE CLOTH 2 % EX PADS: 6.0000 | MEDICATED_PAD | Freq: Every day | CUTANEOUS | Status: DC

## 2019-09-07 MED ORDER — INSULIN ASPART 100 UNIT/ML ~~LOC~~ SOLN
0.0000 [IU] | Freq: Three times a day (TID) | SUBCUTANEOUS | Status: DC
  Administered 2019-09-07: 1 [IU] via SUBCUTANEOUS
  Administered 2019-09-07: 2 [IU] via SUBCUTANEOUS
  Administered 2019-09-08: 1 [IU] via SUBCUTANEOUS
  Administered 2019-09-09: 5 [IU] via SUBCUTANEOUS
  Administered 2019-09-10: 08:00:00 2 [IU] via SUBCUTANEOUS

## 2019-09-07 NOTE — Progress Notes (Signed)
Patient ID: Edward Rocha., male   DOB: 1966/06/15, 53 y.o.   MRN: [id number]  Progress Note    09/07/2019 8:06 AM 16 Days Post-Op  Subjective: Comfortable.  Mild left leg soreness   Vitals:   09/07/19 0735 09/07/19 0738  BP: (!) 150/68   Pulse: 93   Resp: 19 (!) 23  Temp: 98.6 F (37 C)   SpO2: 97%    Physical Exam: Some edema bilaterally.  Audible posterior tibial and dorsalis pedis flow.  All surgical incisions healing  CBC    Component Value Date/Time   WBC 7.2 09/05/2019 0510   RBC 2.48 (L) 09/05/2019 0510   HGB 7.1 (L) 09/05/2019 0510   HCT 23.8 (L) 09/05/2019 0510   PLT 53 (L) 09/05/2019 0510   MCV 96.0 09/05/2019 0510   MCH 28.6 09/05/2019 0510   MCHC 29.8 (L) 09/05/2019 0510   RDW 19.1 (H) 09/05/2019 0510   LYMPHSABS 3.5 08/26/2019 0500   MONOABS 2.2 (H) 08/26/2019 0500   EOSABS 0.4 08/26/2019 0500   BASOSABS 0.1 08/26/2019 0500    BMET    Component Value Date/Time   NA 131 (L) 09/07/2019 0610   K 3.6 09/07/2019 0610   CL 99 09/07/2019 0610   CO2 22 09/07/2019 0610   GLUCOSE 120 (H) 09/07/2019 0610   BUN 24 (H) 09/07/2019 0610   CREATININE 5.01 (H) 09/07/2019 0610   CALCIUM 7.8 (L) 09/07/2019 0610   GFRNONAA 12 (L) 09/07/2019 0610   GFRAA 14 (L) 09/07/2019 0610    INR    Component Value Date/Time   INR 1.7 (H) 08/27/2019 0351     Intake/Output Summary (Last 24 hours) at 09/07/2019 0806 Last data filed at 09/06/2019 1758 Gross per 24 hour  Intake 10 ml  Output 125 ml  Net -115 ml     Assessment/Plan:  53 y.o. male renal evaluation noted.  Remains oliguric with no evidence of renal recovery.  Have suggested tunneled catheter with outpatient hemodialysis.  We will coordinate catheter placement on Tuesday his next nonhemodialysis today     Rosetta Posner, MD [facility] [phone] 09/07/2019 8:06 AM

## 2019-09-07 NOTE — Progress Notes (Signed)
[facility] NEPHROLOGY PROGRESS NOTE  Assessment/ Plan: Pt is a 53 y.o. yo male  W/ a PMH of DM, HTN, HLD, thrombocytopenia, PAD s/p aortogram, iliac stenting, and fem-pop bypass surgery on 10/23, who developed oliguric AKI requiring CRRT from 10/18 -10/21 and has been receiving intermittent HD since 10/24.   # AKI - likely 2/2 ischemic ATN in the setting of hypotension and acute blood loss anemia concomitant with contrast nephropathy. Initially improving, he has become oliguric with worsening renal function over the past few days. Will place him on MWF dialysis schedule.  - He will need perm cath placement.  - arrange for outpatient HD for dialysis dependent AKI.  - continue ESA for anemia.  - using high potassium bath for hypokalemia  # HTN: on labetalol and nifedipine.  Slightly elevated. Stable. . Continue to monitor.   # hypokalemia/hypomagnesemia: normal today - has PRN order for 2g Mg for levels < 1.7.    # acute blood loss anemia: required transfusion.  Iron sat 44%, continue ESA.  Hgb stable 7.1 yesterday. - continue to monitor  # acute encephalopathy: alert and oriented today.  Continue to monitor  #thrombocytopenia: platelet count stable, 46K on 11/3. Has been stable in this range since at least July. A cause does not appear to have been found, possibly ITP. Consider outpatient referral to heme.      Subjective:   Patient states his 'butt and feet' hurt but otherwise has no complaints. He would like someone to call his wife to update her. Stated he had a bloody nose overnight.   Objective General:NAD, comfortable. HEENT: moist oral mucosa.   Heart:R2/6 crescendo-decrescendo systolic murmur.  Lungs:clear b/l, no crackles. Decreased air movement.  Abdomen:soft, Non-tender, non-distended Extremities: trace edema on the RLE, 1+ edema on LLE  Dialysis Access: right IJ cath without signs of infection  Vital signs in last 24 hours: Vitals:   09/06/19 2340  09/07/19 0248 09/07/19 0735 09/07/19 0738  BP: (!) 131/57 (!) 129/49 (!) 150/68   Pulse: 96 88 93   Resp: 20 20 19  (!) 23  Temp: 99 F (37.2 C) 98.7 F (37.1 C) 98.6 F (37 C)   TempSrc: Oral Oral Oral   SpO2: 100% 99% 97%   Weight:      Height:       Weight change:   Intake/Output Summary (Last 24 hours) at 09/07/2019 0753 Last data filed at 09/06/2019 1758 Gross per 24 hour  Intake 10 ml  Output 125 ml  Net -115 ml    Labs: Basic Metabolic Panel: Recent Labs  Lab 09/05/19 0510 09/06/19 0504 09/07/19 0610  NA 132* 131* 131*  K 3.3* 3.6 3.6  CL 103 99 99  CO2 19* 22 22  GLUCOSE 138* 121* 120*  BUN 46* 20 24*  CREATININE 3.44* 3.31* 5.01*  CALCIUM 8.2* 7.5* 7.8*  PHOS 3.9 2.5 3.9   Liver Function Tests: Recent Labs  Lab 09/05/19 0510 09/06/19 0504 09/07/19 0610  ALBUMIN 2.0* 1.9* 1.9*   No results for input(s): LIPASE, AMYLASE in the last 168 hours. Recent Labs  Lab 09/01/19 0500 09/03/19 0848 09/05/19 0510  AMMONIA 41* 39* 42*   CBC: Recent Labs  Lab 09/01/19 0500 09/02/19 0715 09/05/19 0510  WBC 8.9 7.3 7.2  HGB 7.2* 7.2* 7.1*  HCT 24.1* 25.1* 23.8*  MCV 97.6 101.2* 96.0  PLT 49* 46* 53*   CBG: Recent Labs  Lab 09/06/19 0800 09/06/19 1114 09/06/19 1617 09/06/19 2050 09/07/19 [id number]  GLUCAP 116* 176* 159* 132* 124*    Medications: Infusions: . sodium chloride Stopped (08/22/19 1311)  . sodium chloride 35 mL (08/22/19 2239)    Scheduled Medications: . atorvastatin  80 mg Oral Daily  . Chlorhexidine Gluconate Cloth  6 each Topical Daily  . Chlorhexidine Gluconate Cloth  6 each Topical Q0600  . Chlorhexidine Gluconate Cloth  6 each Topical Q0600  . Chlorhexidine Gluconate Cloth  6 each Topical Q0600  . darbepoetin (ARANESP) injection - NON-DIALYSIS  150 mcg Subcutaneous Q Mon-1800  . fluconazole  50 mg Oral Daily  . folic acid  1 mg Oral Daily  . Gerhardt's butt cream   Topical Daily  . heparin injection (subcutaneous)  5,000  Units Subcutaneous Q8H  . insulin aspart  0-9 Units Subcutaneous TID WC  . insulin glargine  15 Units Subcutaneous QPM  . labetalol  300 mg Oral BID  . lactulose  30 g Oral BID  . multivitamin with minerals  1 tablet Oral Daily  . NIFEdipine  90 mg Oral Daily  . pantoprazole  40 mg Oral Daily  . sodium chloride flush  10-40 mL Intracatheter Q12H  . sodium chloride flush  3 mL Intravenous Q12H  . thiamine  100 mg Oral Daily  . vitamin B-12  1,000 mcg Oral Daily    have reviewed scheduled and prn medications.   Benay Pike 09/07/2019,7:53 AM  LOS: 27 days

## 2019-09-08 LAB — CBC
HCT: 25.1 % — ABNORMAL LOW (ref 39.0–52.0)
Hemoglobin: 8.1 g/dL — ABNORMAL LOW (ref 13.0–17.0)
MCH: 29 pg (ref 26.0–34.0)
MCHC: 32.3 g/dL (ref 30.0–36.0)
MCV: 90 fL (ref 80.0–100.0)
Platelets: 42 10*3/uL — ABNORMAL LOW (ref 150–400)
RBC: 2.79 MIL/uL — ABNORMAL LOW (ref 4.22–5.81)
RDW: 17.2 % — ABNORMAL HIGH (ref 11.5–15.5)
WBC: 5.7 10*3/uL (ref 4.0–10.5)
nRBC: 0 % (ref 0.0–0.2)

## 2019-09-08 LAB — RENAL FUNCTION PANEL
Albumin: 1.8 g/dL — ABNORMAL LOW (ref 3.5–5.0)
Anion gap: 10 (ref 5–15)
BUN: 29 mg/dL — ABNORMAL HIGH (ref 6–20)
CO2: 20 mmol/L — ABNORMAL LOW (ref 22–32)
Calcium: 8 mg/dL — ABNORMAL LOW (ref 8.9–10.3)
Chloride: 102 mmol/L (ref 98–111)
Creatinine, Ser: 5.75 mg/dL — ABNORMAL HIGH (ref 0.61–1.24)
GFR calc Af Amer: 12 mL/min — ABNORMAL LOW (ref >60)
GFR calc non Af Amer: 10 mL/min — ABNORMAL LOW (ref >60)
Glucose, Bld: 106 mg/dL — ABNORMAL HIGH (ref 70–99)
Phosphorus: 4.8 mg/dL — ABNORMAL HIGH (ref 2.5–4.6)
Potassium: 3.7 mmol/L (ref 3.5–5.1)
Sodium: 132 mmol/L — ABNORMAL LOW (ref 135–145)

## 2019-09-08 LAB — GLUCOSE, CAPILLARY
Glucose-Capillary: 116 mg/dL — ABNORMAL HIGH (ref 70–99)
Glucose-Capillary: 106 mg/dL — ABNORMAL HIGH (ref 70–99)
Glucose-Capillary: 79 mg/dL (ref 70–99)
Glucose-Capillary: 135 mg/dL — ABNORMAL HIGH (ref 70–99)
Glucose-Capillary: 122 mg/dL — ABNORMAL HIGH (ref 70–99)

## 2019-09-08 LAB — MAGNESIUM: Magnesium: 1.9 mg/dL (ref 1.7–2.4)

## 2019-09-08 MED ORDER — DARBEPOETIN ALFA 60 MCG/0.3ML IJ SOSY
PREFILLED_SYRINGE | INTRAMUSCULAR | Status: AC
  Filled 2019-09-08: qty 0.3

## 2019-09-08 MED ORDER — SODIUM CHLORIDE 0.9 % IV SOLN
1.5000 g | INTRAVENOUS | Status: AC
  Administered 2019-09-09: 1.5 g via INTRAVENOUS
  Filled 2019-09-08: qty 1.5

## 2019-09-08 MED ORDER — HEPARIN SODIUM (PORCINE) 1000 UNIT/ML IJ SOLN
INTRAMUSCULAR | Status: AC
  Filled 2019-09-08: qty 3

## 2019-09-08 MED ORDER — HEPARIN SODIUM (PORCINE) 1000 UNIT/ML IJ SOLN
2.8000 mL | Freq: Once | INTRAMUSCULAR | Status: AC
  Administered 2019-09-08: 2800 [IU] via INTRAVENOUS

## 2019-09-08 NOTE — Progress Notes (Signed)
[facility]GRESS NOTE  Assessment/ Plan: Pt is a 53 y.o. yo male  W/ a PMH of DM, HTN, HLD, thrombocytopenia, PAD s/p aortogram, iliac stenting, and fem-pop bypass surgery on 10/23, who developed oliguric AKI requiring CRRT from 10/18 -10/21 and has been receiving intermittent HD since 10/24.   # AKI - likely 2/2 ischemic ATN in the setting of hypotension and acute blood loss anemia concomitant with contrast nephropathy. Initially improving, he has become oliguric with worsening renal function over the past few days. Will place him on MWF dialysis schedule. Scheduled for HD today.    - He will need perm cath placement, currently scheduled for Tuesday. - arrange for outpatient HD for dialysis dependent AKI.  - continue ESA for anemia.  - using high potassium bath for hypokalemia  # HTN: on labetalol and nifedipine.  Slightly elevated. Stable. . Continue to monitor.   # hypokalemia/hypomagnesemia: normal today - has PRN order for 2g Mg for levels < 1.7.    # acute blood loss anemia: required transfusion.  Iron sat 44%, continue ESA.  Hgb stable but low.  - continue to monitor. Managed per primary team.   # acute encephalopathy: alert and oriented today.  Continue to monitor  #thrombocytopenia: platelet count stable, 46K on 11/3. Has been stable in this range since at least July. A cause does not appear to have been found, possibly ITP. Consider outpatient referral to heme.      Subjective:   Patient states he feels 'better' today than yesterday.  Had an episode of epistaxis last night.   Objective General:NAD, comfortable. Laying in bed receiving dialysis.  Heart:R2/6 crescendo-decrescendo systolic murmur.  Lungs:clear b/l, no crackles. Decreased air movement.  Extremities: trace edema on the RLE, 1+ edema on LLE  Dialysis Access: right IJ cath without signs of infection  Vital signs in last 24 hours: Vitals:   09/07/19 1615 09/07/19 1942 09/08/19 0014  09/08/19 0308  BP: (!) 146/56 (!) 136/54 (!) 92/45 (!) 148/56  Pulse: 90 92 90 90  Resp: 19 20 16 12   Temp: 98.5 F (36.9 C) 99.1 F (37.3 C) 98.7 F (37.1 C) 98.8 F (37.1 C)  TempSrc: Oral Oral Oral Oral  SpO2:  98% 97% 98%  Weight:      Height:       Weight change:   Intake/Output Summary (Last 24 hours) at 09/08/2019 0746 Last data filed at 09/07/2019 1815 Gross per 24 hour  Intake -  Output 100 ml  Net -100 ml    Labs: Basic Metabolic Panel: Recent Labs  Lab 09/06/19 0504 09/07/19 0610 09/08/19 0623  NA 131* 131* 132*  K 3.6 3.6 3.7  CL 99 99 102  CO2 22 22 20*  GLUCOSE 121* 120* 106*  BUN 20 24* 29*  CREATININE 3.31* 5.01* 5.75*  CALCIUM 7.5* 7.8* 8.0*  PHOS 2.5 3.9 4.8*   Liver Function Tests: Recent Labs  Lab 09/06/19 0504 09/07/19 0610 09/08/19 0623  ALBUMIN 1.9* 1.9* 1.8*   No results for input(s): LIPASE, AMYLASE in the last 168 hours. Recent Labs  Lab 09/03/19 0848 09/05/19 0510  AMMONIA 39* 42*   CBC: Recent Labs  Lab 09/02/19 0715 09/05/19 0510  WBC 7.3 7.2  HGB 7.2* 7.1*  HCT 25.1* 23.8*  MCV 101.2* 96.0  PLT 46* 53*   CBG: Recent Labs  Lab 09/07/19 0834 09/07/19 1152 09/07/19 1615 09/07/19 2049 09/08/19 0608  GLUCAP 103* 128* 162* 158* 106*    Medications:  Infusions: . sodium chloride Stopped (08/22/19 1311)  . sodium chloride 35 mL (08/22/19 2239)    Scheduled Medications: . atorvastatin  80 mg Oral Daily  . Chlorhexidine Gluconate Cloth  6 each Topical Daily  . darbepoetin (ARANESP) injection - DIALYSIS  60 mcg Intravenous Q Mon-HD  . fluconazole  50 mg Oral Daily  . folic acid  1 mg Oral Daily  . Gerhardt's butt cream   Topical Daily  . heparin injection (subcutaneous)  5,000 Units Subcutaneous Q8H  . insulin aspart  0-9 Units Subcutaneous TID WC  . insulin glargine  15 Units Subcutaneous QPM  . labetalol  300 mg Oral BID  . lactulose  30 g Oral BID  . multivitamin with minerals  1 tablet Oral Daily  .  NIFEdipine  90 mg Oral Daily  . pantoprazole  40 mg Oral Daily  . sodium chloride flush  10-40 mL Intracatheter Q12H  . sodium chloride flush  3 mL Intravenous Q12H  . thiamine  100 mg Oral Daily  . vitamin B-12  1,000 mcg Oral Daily    have reviewed scheduled and prn medications.   Benay Pike 09/08/2019,7:46 AM  LOS: 28 days

## 2019-09-08 NOTE — Plan of Care (Signed)
  Problem: Education: Goal: Knowledge of General Education information will improve Description: Including pain rating scale, medication(s)/side effects and non-pharmacologic comfort measures Outcome: Not Progressing   Problem: Health Behavior/Discharge Planning: Goal: Ability to manage health-related needs will improve Outcome: Not Progressing   Problem: Clinical Measurements: Goal: Ability to maintain clinical measurements within normal limits will improve Outcome: Not Progressing Goal: Will remain free from infection Outcome: Not Progressing Goal: Diagnostic test results will improve Outcome: Not Progressing Goal: Respiratory complications will improve Outcome: Not Progressing Goal: Cardiovascular complication will be avoided Outcome: Not Progressing   Problem: Activity: Goal: Risk for activity intolerance will decrease Outcome: Not Progressing   Problem: Nutrition: Goal: Adequate nutrition will be maintained Outcome: Not Progressing   Problem: Coping: Goal: Level of anxiety will decrease Outcome: Not Progressing   Problem: Elimination: Goal: Will not experience complications related to bowel motility Outcome: Not Progressing Goal: Will not experience complications related to urinary retention Outcome: Not Progressing   Problem: Pain Managment: Goal: General experience of comfort will improve Outcome: Not Progressing   Problem: Safety: Goal: Ability to remain free from injury will improve Outcome: Not Progressing   Problem: Skin Integrity: Goal: Risk for impaired skin integrity will decrease Outcome: Not Progressing   Problem: Education: Goal: Knowledge of disease and its progression will improve Outcome: Not Progressing   Problem: Health Behavior/Discharge Planning: Goal: Ability to manage health-related needs will improve Outcome: Not Progressing   Problem: Clinical Measurements: Goal: Complications related to the disease process or treatment will be  avoided or minimized Outcome: Not Progressing Goal: Dialysis access will remain free of complications Outcome: Not Progressing   Problem: Activity: Goal: Activity intolerance will improve Outcome: Not Progressing   Problem: Fluid Volume: Goal: Fluid volume balance will be maintained or improved Outcome: Not Progressing   Problem: Nutritional: Goal: Ability to make appropriate dietary choices will improve Outcome: Not Progressing   Problem: Respiratory: Goal: Respiratory symptoms related to disease process will be avoided Outcome: Not Progressing   Problem: Self-Concept: Goal: Body image disturbance will be avoided or minimized Outcome: Not Progressing   Problem: Urinary Elimination: Goal: Progression of disease will be identified and treated Outcome: Not Progressing   

## 2019-09-08 NOTE — Progress Notes (Signed)
Inpatient Rehabilitation Admissions Coordinator  I met with patient at bedside in hemodialysis. He states he prefers direct d/c home if possible. Needs to get back home to his wife. He states he has walked with nursing in the room and hallway. I await further therapy assessment to determine if this is realistic.  Danne Baxter, RN, MSN Rehab Admissions Coordinator [phone] 09/08/2019 11:28 AM

## 2019-09-08 NOTE — Progress Notes (Signed)
Renal Navigator has been notified by Nephrology that patient needs referral for OP HD treatment. Renal Navigator met with patient to complete assessment. Patient is very pleasant and appreciative of Navigator's assistance in getting his OP HD arranged in [place]. Referral completed to [facility]. _0 Renal Navigator will follow closely.  Alphonzo Cruise, [place] [facility] [phone]

## 2019-09-08 NOTE — Procedures (Signed)
I was present at this dialysis session. I have reviewed the session itself and made appropriate changes.   4K bath. 2L UF goal. No c/o from Pt. BP stable.   Filed Weights   09/05/19 1620 09/06/19 0310 09/08/19 0800  Weight: 86.4 kg 87.7 kg 88.2 kg    Recent Labs  Lab 09/08/19 0623  NA 132*  K 3.7  CL 102  CO2 20*  GLUCOSE 106*  BUN 29*  CREATININE 5.75*  CALCIUM 8.0*  PHOS 4.8*    Recent Labs  Lab 09/02/19 0715 09/05/19 0510  WBC 7.3 7.2  HGB 7.2* 7.1*  HCT 25.1* 23.8*  MCV 101.2* 96.0  PLT 46* 53*    Scheduled Meds: . atorvastatin  80 mg Oral Daily  . Chlorhexidine Gluconate Cloth  6 each Topical Daily  . Darbepoetin Alfa      . darbepoetin (ARANESP) injection - DIALYSIS  60 mcg Intravenous Q Mon-HD  . fluconazole  50 mg Oral Daily  . folic acid  1 mg Oral Daily  . Gerhardt's butt cream   Topical Daily  . heparin      . heparin  2.8 mL Intravenous Once  . heparin injection (subcutaneous)  5,000 Units Subcutaneous Q8H  . insulin aspart  0-9 Units Subcutaneous TID WC  . insulin glargine  15 Units Subcutaneous QPM  . labetalol  300 mg Oral BID  . lactulose  30 g Oral BID  . multivitamin with minerals  1 tablet Oral Daily  . NIFEdipine  90 mg Oral Daily  . pantoprazole  40 mg Oral Daily  . sodium chloride flush  10-40 mL Intracatheter Q12H  . sodium chloride flush  3 mL Intravenous Q12H  . thiamine  100 mg Oral Daily  . vitamin B-12  1,000 mcg Oral Daily   Continuous Infusions: . sodium chloride Stopped (08/22/19 1311)  . sodium chloride 35 mL (08/22/19 2239)   PRN Meds:.sodium chloride, alteplase, influenza vac split quadrivalent PF, labetalol, levalbuterol, lip balm, phenol, pneumococcal 23 valent vaccine, Resource ThickenUp Clear, sodium chloride flush, sodium chloride flush   Pearson Grippe  MD 09/08/2019, 10:18 AM

## 2019-09-08 NOTE — Progress Notes (Signed)
Physical Therapy Treatment Patient Details Name: Edward Rocha. MRN: [id number] DOB: [birth year] Today's Date: 09/08/2019    History of Present Illness 53 y.o. male is s/p L femoral to popliteal bypass and iliac stenting 10 Days Post-Op . Pt with 2 intubations this admission with recent extubation on 10/29, on CRRT until 10/26.      PT Comments    Pt tolerated treatment well demonstrating improved transfer and gait quality as well as improved tolerance for all mobility. Pt is able to perform all ambulation and transfers without physical assistance with the use of a RW. Pt expressing desire to discharge home and is now at an appropriate functional level to do so with supervision from family and use of a RW. PT updating recommendations to home with HHPT and a RW.   Follow Up Recommendations  Home health PT     Equipment Recommendations  Rolling walker with 5" wheels    Recommendations for Other Services       Precautions / Restrictions Precautions Precautions: Fall Restrictions Weight Bearing Restrictions: No    Mobility  Bed Mobility Overal bed mobility: Independent                Transfers Overall transfer level: Needs assistance   Transfers: Sit to/from Stand Sit to Stand: Supervision            Ambulation/Gait Ambulation/Gait assistance: Supervision Gait Distance (Feet): 80 Feet Assistive device: Rolling walker (2 wheeled) Gait Pattern/deviations: Step-to pattern;Decreased step length - right;Decreased step length - left Gait velocity: reduced Gait velocity interpretation: <1.8 ft/sec, indicate of risk for recurrent falls General Gait Details: pt with slow yet steady step to gait pattern, pt reports reduced step length is related to chronic foot pain   Stairs             Wheelchair Mobility    Modified Rankin (Stroke Patients Only)       Balance Overall balance assessment: Needs assistance Sitting-balance support: No upper extremity  supported;Feet supported Sitting balance-Leahy Scale: Good Sitting balance - Comments: modI   Standing balance support: Single extremity supported Standing balance-Leahy Scale: Good Standing balance comment: supervision of 1 person                            Cognition Arousal/Alertness: Awake/alert Behavior During Therapy: WFL for tasks assessed/performed Overall Cognitive Status: Within Functional Limits for tasks assessed                                        Exercises      General Comments General comments (skin integrity, edema, etc.): VSS      Pertinent Vitals/Pain Pain Assessment: Faces Faces Pain Scale: Hurts even more Pain Location: buttocks Pain Descriptors / Indicators: Sore Pain Intervention(s): Limited activity within patient's tolerance    Home Living                      Prior Function            PT Goals (current goals can now be found in the care plan section) Acute Rehab PT Goals Patient Stated Goal: improve mobility Progress towards PT goals: Progressing toward goals    Frequency    Min 3X/week      PT Plan Discharge plan needs to be updated    Co-evaluation  AM-PAC PT "6 Clicks" Mobility   Outcome Measure  Help needed turning from your back to your side while in a flat bed without using bedrails?: None Help needed moving from lying on your back to sitting on the side of a flat bed without using bedrails?: None Help needed moving to and from a bed to a chair (including a wheelchair)?: None Help needed standing up from a chair using your arms (e.g., wheelchair or bedside chair)?: None Help needed to walk in hospital room?: None Help needed climbing 3-5 steps with a railing? : A Little 6 Click Score: 23    End of Session Equipment Utilized During Treatment: (none) Activity Tolerance: Patient tolerated treatment well Patient left: in bed;with call bell/phone within reach Nurse  Communication: Mobility status PT Visit Diagnosis: Muscle weakness (generalized) (M62.81) Pain - part of body: (buttocks)     Time: 1357-1410 PT Time Calculation (min) (ACUTE ONLY): 13 min  Charges:  $Gait Training: 8-22 mins                     Zenaida Niece, PT, DPT Acute Rehabilitation Pager: [phone]    Zenaida Niece 09/08/2019, 2:24 PM

## 2019-09-08 NOTE — Progress Notes (Addendum)
[facility] [place] more each day.  Now on HD MWF.   Objective (!) 148/56 90 98.8 F (37.1 C) (Oral) 12 98%  Intake/Output Summary (Last 24 hours) at 09/08/2019 0803 Last data filed at 09/07/2019 1815 Gross per 24 hour  Intake -  Output 100 ml  Net -100 ml    Left LE with well healed, groin healing well with small superficial wound, dry guaze over groin incision. Left foot warm with intact active motor.   Currently on HD    Assessment/Planning: 53 y.o.maleis s/p L femoral to popliteal bypass and iliac stenting  Patent bypass On HD for AKI plan TDC placement tomorrow NPO past MN Will draw ammonia level tomorrow.  Continue Lactulose 30 g BID. Pending CIR  [name] 09/08/2019 8:03 AM --  Laboratory Lab Results: No results for input(s): WBC, HGB, HCT, PLT in the last 72 hours. BMET Recent Labs    09/07/19 0610 09/08/19 0623  NA 131* 132*  K 3.6 3.7  CL 99 102  CO2 22 20*  GLUCOSE 120* 106*  BUN 24* 29*  CREATININE 5.01* 5.75*  CALCIUM 7.8* 8.0*    COAG Lab Results  Component Value Date   INR 1.7 (H) 08/27/2019   INR 1.4 (H) 08/22/2019   INR 1.5 (H) 08/22/2019   No results found for: PTT  I have examined the patient, reviewed and agree with above.  Curt Jews, MD 09/08/2019 1:45 PM

## 2019-09-08 NOTE — Progress Notes (Signed)
Inpatient Rehabilitation Admissions Coordinator  Patient is at supervision level with therapy today after hemodialysis treatment. He is no longer in need of an inpt rehab admit prior to d/c home. We will sign off at this time. I will notify RN CM, Kristi.  Danne Baxter, RN, MSN Rehab Admissions Coordinator [phone] 09/08/2019 3:51 PM

## 2019-09-08 NOTE — Anesthesia Preprocedure Evaluation (Addendum)
Anesthesia Evaluation  Patient identified by MRN, date of birth, ID band Patient awake    Reviewed: Allergy & Precautions, NPO status , Patient's Chart, lab work & pertinent test results  Airway Mallampati: III  TM Distance: >3 FB Neck ROM: Full    Dental  (+) Edentulous Upper, Edentulous Lower   Pulmonary former smoker,    Pulmonary exam normal breath sounds clear to auscultation       Cardiovascular hypertension, Pt. on home beta blockers + Peripheral Vascular Disease  Normal cardiovascular exam Rhythm:Regular Rate:Normal     Neuro/Psych negative neurological ROS  negative psych ROS   GI/Hepatic negative GI ROS, Neg liver ROS,   Endo/Other  diabetes, Oral Hypoglycemic Agents, Insulin Dependent  Renal/GU ESRF and DialysisRenal disease     Musculoskeletal negative musculoskeletal ROS (+)   Abdominal   Peds  Hematology  (+) anemia , HLD Thrombocytopenia    Anesthesia Other Findings end stage renal disease  Reproductive/Obstetrics                            Anesthesia Physical Anesthesia Plan  ASA: IV  Anesthesia Plan: General   Post-op Pain Management:    Induction: Intravenous  PONV Risk Score and Plan: 2 and Ondansetron, Dexamethasone and Treatment may vary due to age or medical condition  Airway Management Planned: LMA  Additional Equipment:   Intra-op Plan:   Post-operative Plan: Extubation in OR  Informed Consent: I have reviewed the patients History and Physical, chart, labs and discussed the procedure including the risks, benefits and alternatives for the proposed anesthesia with the patient or authorized representative who has indicated his/her understanding and acceptance.     Dental advisory given  Plan Discussed with: CRNA  Anesthesia Plan Comments:         Anesthesia Quick Evaluation

## 2019-09-09 ENCOUNTER — Inpatient Hospital Stay (HOSPITAL_COMMUNITY): Payer: Managed Care, Other (non HMO)

## 2019-09-09 ENCOUNTER — Inpatient Hospital Stay (HOSPITAL_COMMUNITY): Payer: Managed Care, Other (non HMO) | Admitting: Anesthesiology

## 2019-09-09 ENCOUNTER — Encounter (HOSPITAL_COMMUNITY)
Admission: RE | Disposition: A | Discharge status: Home Health | Payer: Self-pay | Source: Home / Self Care | Attending: Vascular Surgery

## 2019-09-09 ENCOUNTER — Encounter (HOSPITAL_COMMUNITY): Payer: Self-pay | Admitting: Anesthesiology

## 2019-09-09 DIAGNOSIS — N179 Acute kidney failure, unspecified: Secondary | ICD-10-CM

## 2019-09-09 HISTORY — PX: INSERTION OF DIALYSIS CATHETER: SHX1324

## 2019-09-09 HISTORY — PX: ULTRASOUND GUIDANCE FOR VASCULAR ACCESS: SHX6516

## 2019-09-09 HISTORY — PX: REMOVAL OF A DIALYSIS CATHETER: SHX6053

## 2019-09-09 LAB — RENAL FUNCTION PANEL
Albumin: 1.9 g/dL — ABNORMAL LOW (ref 3.5–5.0)
Albumin: 2 g/dL — ABNORMAL LOW (ref 3.5–5.0)
Anion gap: 9 (ref 5–15)
Anion gap: 9 (ref 5–15)
BUN: 15 mg/dL (ref 6–20)
BUN: 18 mg/dL (ref 6–20)
CO2: 24 mmol/L (ref 22–32)
CO2: 24 mmol/L (ref 22–32)
Calcium: 7.6 mg/dL — ABNORMAL LOW (ref 8.9–10.3)
Calcium: 7.9 mg/dL — ABNORMAL LOW (ref 8.9–10.3)
Chloride: 100 mmol/L (ref 98–111)
Chloride: 102 mmol/L (ref 98–111)
Creatinine, Ser: 4.34 mg/dL — ABNORMAL HIGH (ref 0.61–1.24)
Creatinine, Ser: 4.47 mg/dL — ABNORMAL HIGH (ref 0.61–1.24)
GFR calc Af Amer: 17 mL/min — ABNORMAL LOW (ref >60)
GFR calc Af Amer: 16 mL/min — ABNORMAL LOW (ref >60)
GFR calc non Af Amer: 14 mL/min — ABNORMAL LOW (ref >60)
GFR calc non Af Amer: 14 mL/min — ABNORMAL LOW (ref >60)
Glucose, Bld: 80 mg/dL (ref 70–99)
Glucose, Bld: 173 mg/dL — ABNORMAL HIGH (ref 70–99)
Phosphorus: 2.9 mg/dL (ref 2.5–4.6)
Phosphorus: 4.9 mg/dL — ABNORMAL HIGH (ref 2.5–4.6)
Potassium: 3.4 mmol/L — ABNORMAL LOW (ref 3.5–5.1)
Potassium: 4.8 mmol/L (ref 3.5–5.1)
Sodium: 133 mmol/L — ABNORMAL LOW (ref 135–145)
Sodium: 135 mmol/L (ref 135–145)

## 2019-09-09 LAB — CBC
HCT: 28.4 % — ABNORMAL LOW (ref 39.0–52.0)
Hemoglobin: 8.7 g/dL — ABNORMAL LOW (ref 13.0–17.0)
MCH: 28.2 pg (ref 26.0–34.0)
MCHC: 30.6 g/dL (ref 30.0–36.0)
MCV: 92.2 fL (ref 80.0–100.0)
Platelets: 50 10*3/uL — ABNORMAL LOW (ref 150–400)
RBC: 3.08 MIL/uL — ABNORMAL LOW (ref 4.22–5.81)
RDW: 17.3 % — ABNORMAL HIGH (ref 11.5–15.5)
WBC: 3.5 10*3/uL — ABNORMAL LOW (ref 4.0–10.5)
nRBC: 0 % (ref 0.0–0.2)

## 2019-09-09 LAB — GLUCOSE, CAPILLARY
Glucose-Capillary: 76 mg/dL (ref 70–99)
Glucose-Capillary: 76 mg/dL (ref 70–99)
Glucose-Capillary: 158 mg/dL — ABNORMAL HIGH (ref 70–99)
Glucose-Capillary: 267 mg/dL — ABNORMAL HIGH (ref 70–99)
Glucose-Capillary: 331 mg/dL — ABNORMAL HIGH (ref 70–99)

## 2019-09-09 LAB — MAGNESIUM: Magnesium: 1.8 mg/dL (ref 1.7–2.4)

## 2019-09-09 SURGERY — INSERTION OF DIALYSIS CATHETER
Anesthesia: General | Laterality: Right

## 2019-09-09 MED ORDER — SODIUM CHLORIDE 0.9 % IV SOLN: 100.0000 mL | INTRAVENOUS | Status: DC | PRN

## 2019-09-09 MED ORDER — PHENYLEPHRINE HCL-NACL 10-0.9 MG/250ML-% IV SOLN
INTRAVENOUS | Status: DC | PRN
  Administered 2019-09-09: 10 ug/min via INTRAVENOUS

## 2019-09-09 MED ORDER — ALTEPLASE 2 MG IJ SOLR: 2.0000 mg | Freq: Once | INTRAMUSCULAR | Status: DC | PRN

## 2019-09-09 MED ORDER — LIDOCAINE HCL (PF) 1 % IJ SOLN: 5.0000 mL | INTRAMUSCULAR | Status: DC | PRN

## 2019-09-09 MED ORDER — ONDANSETRON HCL 4 MG/2ML IJ SOLN: 4.0000 mg | Freq: Once | INTRAMUSCULAR | Status: DC | PRN

## 2019-09-09 MED ORDER — SODIUM CHLORIDE 0.9 % IV SOLN
INTRAVENOUS | Status: AC
  Filled 2019-09-09: qty 1.2

## 2019-09-09 MED ORDER — ONDANSETRON HCL 4 MG/2ML IJ SOLN
INTRAMUSCULAR | Status: DC | PRN
  Administered 2019-09-09: 4 mg via INTRAVENOUS

## 2019-09-09 MED ORDER — FENTANYL CITRATE (PF) 100 MCG/2ML IJ SOLN
25.0000 ug | INTRAMUSCULAR | Status: DC | PRN
  Administered 2019-09-09: 25 ug via INTRAVENOUS

## 2019-09-09 MED ORDER — 0.9 % SODIUM CHLORIDE (POUR BTL) OPTIME
TOPICAL | Status: DC | PRN
  Administered 2019-09-09: 1000 mL

## 2019-09-09 MED ORDER — PENTAFLUOROPROP-TETRAFLUOROETH EX AERO: 1.0000 "application " | INHALATION_SPRAY | CUTANEOUS | Status: DC | PRN

## 2019-09-09 MED ORDER — PROPOFOL 10 MG/ML IV BOLUS
INTRAVENOUS | Status: AC
  Filled 2019-09-09: qty 20

## 2019-09-09 MED ORDER — LIDOCAINE HCL 1 % IJ SOLN
INTRAMUSCULAR | Status: AC
  Filled 2019-09-09: qty 20

## 2019-09-09 MED ORDER — HEPARIN SODIUM (PORCINE) 1000 UNIT/ML DIALYSIS: 1000.0000 [IU] | INTRAMUSCULAR | Status: DC | PRN

## 2019-09-09 MED ORDER — STERILE WATER FOR IRRIGATION IR SOLN
Status: DC | PRN
  Administered 2019-09-09: 1000 mL

## 2019-09-09 MED ORDER — FENTANYL CITRATE (PF) 250 MCG/5ML IJ SOLN
INTRAMUSCULAR | Status: AC
  Filled 2019-09-09: qty 5

## 2019-09-09 MED ORDER — HEPARIN SODIUM (PORCINE) 1000 UNIT/ML IJ SOLN
INTRAMUSCULAR | Status: DC | PRN
  Administered 2019-09-09: 3000 [IU] via INTRAVENOUS

## 2019-09-09 MED ORDER — LIDOCAINE-PRILOCAINE 2.5-2.5 % EX CREA: 1.0000 "application " | TOPICAL_CREAM | CUTANEOUS | Status: DC | PRN

## 2019-09-09 MED ORDER — MIDAZOLAM HCL 2 MG/2ML IJ SOLN
INTRAMUSCULAR | Status: AC
  Filled 2019-09-09: qty 2

## 2019-09-09 MED ORDER — PROPOFOL 10 MG/ML IV BOLUS
INTRAVENOUS | Status: DC | PRN
  Administered 2019-09-09: 100 mg via INTRAVENOUS
  Administered 2019-09-09: 50 mg via INTRAVENOUS

## 2019-09-09 MED ORDER — DEXAMETHASONE SODIUM PHOSPHATE 10 MG/ML IJ SOLN
INTRAMUSCULAR | Status: DC | PRN
  Administered 2019-09-09: 5 mg via INTRAVENOUS

## 2019-09-09 MED ORDER — FENTANYL CITRATE (PF) 100 MCG/2ML IJ SOLN
INTRAMUSCULAR | Status: AC
  Filled 2019-09-09: qty 2

## 2019-09-09 MED ORDER — HEPARIN SODIUM (PORCINE) 1000 UNIT/ML DIALYSIS: 20.0000 [IU]/kg | INTRAMUSCULAR | Status: DC | PRN

## 2019-09-09 MED ORDER — LIDOCAINE 2% (20 MG/ML) 5 ML SYRINGE
INTRAMUSCULAR | Status: DC | PRN
  Administered 2019-09-09: 60 mg via INTRAVENOUS

## 2019-09-09 MED ORDER — SODIUM CHLORIDE 0.9 % IV SOLN
INTRAVENOUS | Status: DC | PRN
  Administered 2019-09-09: 500 mL

## 2019-09-09 MED ORDER — HEPARIN SODIUM (PORCINE) 1000 UNIT/ML IJ SOLN
INTRAMUSCULAR | Status: AC
  Filled 2019-09-09: qty 1

## 2019-09-09 MED ORDER — FENTANYL CITRATE (PF) 100 MCG/2ML IJ SOLN
INTRAMUSCULAR | Status: DC | PRN
  Administered 2019-09-09: 25 ug via INTRAVENOUS

## 2019-09-09 MED ORDER — LIDOCAINE-EPINEPHRINE 0.5 %-1:200000 IJ SOLN
INTRAMUSCULAR | Status: AC
  Filled 2019-09-09: qty 1

## 2019-09-09 SURGICAL SUPPLY — 37 items
BAG DECANTER FOR FLEXI CONT (MISCELLANEOUS) ×3 IMPLANT
BIOPATCH RED 1 DISK 7.0 (GAUZE/BANDAGES/DRESSINGS) ×2 IMPLANT
BIOPATCH RED 1IN DISK 7.0MM (GAUZE/BANDAGES/DRESSINGS) ×1
CATH PALINDROME RT-P 15FX19CM (CATHETERS) ×3 IMPLANT
CATH PALINDROME RT-P 15FX23CM (CATHETERS) IMPLANT
CATH PALINDROME RT-P 15FX28CM (CATHETERS) IMPLANT
CATH PALINDROME RT-P 15FX55CM (CATHETERS) IMPLANT
COVER PROBE W GEL 5X96 (DRAPES) ×3 IMPLANT
COVER SURGICAL LIGHT HANDLE (MISCELLANEOUS) ×3 IMPLANT
COVER WAND RF STERILE (DRAPES) IMPLANT
DERMABOND ADVANCED (GAUZE/BANDAGES/DRESSINGS) ×4
DERMABOND ADVANCED .7 DNX12 (GAUZE/BANDAGES/DRESSINGS) ×2 IMPLANT
DRAPE C-ARM 42X72 X-RAY (DRAPES) ×3 IMPLANT
DRAPE CHEST BREAST 15X10 FENES (DRAPES) ×3 IMPLANT
GAUZE 4X4 16PLY RFD (DISPOSABLE) ×3 IMPLANT
GLOVE BIO SURGEON STRL SZ7.5 (GLOVE) ×3 IMPLANT
GOWN STRL REUS W/ TWL LRG LVL3 (GOWN DISPOSABLE) ×1 IMPLANT
GOWN STRL REUS W/ TWL XL LVL3 (GOWN DISPOSABLE) ×1 IMPLANT
GOWN STRL REUS W/TWL LRG LVL3 (GOWN DISPOSABLE) ×2
GOWN STRL REUS W/TWL XL LVL3 (GOWN DISPOSABLE) ×2
KIT BASIN OR (CUSTOM PROCEDURE TRAY) ×3 IMPLANT
KIT TURNOVER KIT B (KITS) ×3 IMPLANT
NEEDLE 18GX1X1/2 (RX/OR ONLY) (NEEDLE) ×3 IMPLANT
NEEDLE HYPO 25GX1X1/2 BEV (NEEDLE) ×3 IMPLANT
NS IRRIG 1000ML POUR BTL (IV SOLUTION) ×3 IMPLANT
PACK SURGICAL SETUP 50X90 (CUSTOM PROCEDURE TRAY) ×3 IMPLANT
PAD ARMBOARD 7.5X6 YLW CONV (MISCELLANEOUS) ×3 IMPLANT
SOAP 2 % CHG 4 OZ (WOUND CARE) ×3 IMPLANT
SUT ETHILON 3 0 PS 1 (SUTURE) ×3 IMPLANT
SUT MNCRL AB 4-0 PS2 18 (SUTURE) ×3 IMPLANT
SYR 10ML LL (SYRINGE) ×3 IMPLANT
SYR 20ML LL LF (SYRINGE) ×6 IMPLANT
SYR 5ML LL (SYRINGE) ×3 IMPLANT
SYR CONTROL 10ML LL (SYRINGE) ×3 IMPLANT
TOWEL GREEN STERILE (TOWEL DISPOSABLE) ×3 IMPLANT
TOWEL GREEN STERILE FF (TOWEL DISPOSABLE) ×6 IMPLANT
WATER STERILE IRR 1000ML POUR (IV SOLUTION) ×3 IMPLANT

## 2019-09-09 NOTE — Progress Notes (Signed)
[facility] NEPHROLOGY PROGRESS NOTE  Assessment/ Plan: Pt is a 53 y.o. yo male  W/ a PMH of DM, HTN, HLD, thrombocytopenia, PAD s/p aortogram, iliac stenting, and fem-pop bypass surgery on 10/23, who developed oliguric AKI requiring CRRT from 10/18 -10/21 and has been receiving intermittent HD since 10/24.   # AKI - likely 2/2 ischemic ATN in the setting of hypotension and acute blood loss anemia concomitant with contrast nephropathy. Initially improving, he has become oliguric with worsening renal function over the past few days. Will place him on MWF dialysis schedule. HD yesterday, 2L taken off, UOP 255ml.  HD tunneled cath placed today. - arrange for outpatient HD for dialysis dependent AKI.  - continue ESA for anemia.  - using high potassium bath for hypokalemia  # HTN: on labetalol and nifedipine.  Slightly elevated. Stable. Continue to monitor.   # hypokalemia/hypomagnesemia: normal today - has PRN order for 2g Mg for levels < 1.7.    # acute blood loss anemia: required transfusion.  Iron sat 44%, continue ESA.  Hgb stable but low.  - continue to monitor. Managed per primary team.   #thrombocytopenia: platelet count stable ~45k. Has been stable in this range since at least July. A cause does not appear to have been found, possibly ITP. Consider outpatient referral to heme.      Subjective:   Patient feels good. States he is ready to go home. No complaints after cath placement.    Objective General:NAD, comfortable. Laying in bed Heart:R2/6 crescendo-decrescendo systolic murmur.  Lungs:clear b/l, no crackles. Decreased air movement.  Extremities: trace edema on the RLE, 1+ edema on LLE  Dialysis Access: right tunneled HD cath. No bleeding.   Vital signs in last 24 hours: Vitals:   09/09/19 0900 09/09/19 0915 09/09/19 0917 09/09/19 0918  BP:    (!) 153/64  Pulse: 87 88 88 88  Resp: 15 18 16 16   Temp:      TempSrc:      SpO2: 94% 94% 95% 95%  Weight:       Height:       Weight change:   Intake/Output Summary (Last 24 hours) at 09/09/2019 0940 Last data filed at 09/09/2019 0815 Gross per 24 hour  Intake 250 ml  Output 2210 ml  Net -1960 ml    Labs: Basic Metabolic Panel: Recent Labs  Lab 09/07/19 0610 09/08/19 0623 09/09/19 0221  NA 131* 132* 133*  K 3.6 3.7 3.4*  CL 99 102 100  CO2 22 20* 24  GLUCOSE 120* 106* 80  BUN 24* 29* 15  CREATININE 5.01* 5.75* 4.34*  CALCIUM 7.8* 8.0* 7.6*  PHOS 3.9 4.8* 2.9   Liver Function Tests: Recent Labs  Lab 09/07/19 0610 09/08/19 0623 09/09/19 0221  ALBUMIN 1.9* 1.8* 1.9*   No results for input(s): LIPASE, AMYLASE in the last 168 hours. Recent Labs  Lab 09/03/19 0848 09/05/19 0510  AMMONIA 39* 42*   CBC: Recent Labs  Lab 09/05/19 0510 09/08/19 1403  WBC 7.2 5.7  HGB 7.1* 8.1*  HCT 23.8* 25.1*  MCV 96.0 90.0  PLT 53* 42*   CBG: Recent Labs  Lab 09/08/19 1241 09/08/19 1644 09/08/19 2205 09/09/19 0626 09/09/19 0814  GLUCAP 79 135* 122* 76 76    Medications: Infusions: . sodium chloride 0 mL/hr at 08/22/19 1312  . [MAR Hold] sodium chloride 35 mL (08/22/19 2239)    Scheduled Medications: . [MAR Hold] atorvastatin  80 mg Oral Daily  . [facility] Hold]  Chlorhexidine Gluconate Cloth  6 each Topical Daily  . [MAR Hold] darbepoetin (ARANESP) injection - DIALYSIS  60 mcg Intravenous Q Mon-HD  . fentaNYL      . [MAR Hold] fluconazole  50 mg Oral Daily  . [MAR Hold] folic acid  1 mg Oral Daily  . [MAR Hold] Gerhardt's butt cream   Topical Daily  . [MAR Hold] heparin injection (subcutaneous)  5,000 Units Subcutaneous Q8H  . [MAR Hold] insulin aspart  0-9 Units Subcutaneous TID WC  . [MAR Hold] insulin glargine  15 Units Subcutaneous QPM  . [MAR Hold] labetalol  300 mg Oral BID  . [MAR Hold] lactulose  30 g Oral BID  . [MAR Hold] multivitamin with minerals  1 tablet Oral Daily  . [MAR Hold] NIFEdipine  90 mg Oral Daily  . [MAR Hold] pantoprazole  40 mg Oral Daily  .  [MAR Hold] sodium chloride flush  10-40 mL Intracatheter Q12H  . [MAR Hold] sodium chloride flush  3 mL Intravenous Q12H  . [MAR Hold] thiamine  100 mg Oral Daily  . [MAR Hold] vitamin B-12  1,000 mcg Oral Daily    have reviewed scheduled and prn medications.   Benay Pike 09/09/2019,9:40 AM  LOS: 29 days

## 2019-09-09 NOTE — Anesthesia Procedure Notes (Signed)
Procedure Name: LMA Insertion Date/Time: 09/09/2019 7:36 AM Performed by: Kyung Rudd, CRNA Pre-anesthesia Checklist: Patient identified, Emergency Drugs available, Suction available and Patient being monitored Patient Re-evaluated:Patient Re-evaluated prior to induction Oxygen Delivery Method: Circle system utilized Preoxygenation: Pre-oxygenation with 100% oxygen Induction Type: IV induction LMA: LMA with gastric port inserted LMA Size: 4.0 Number of attempts: 1 Placement Confirmation: positive ETCO2 and breath sounds checked- equal and bilateral Tube secured with: Tape Dental Injury: Teeth and Oropharynx as per pre-operative assessment

## 2019-09-09 NOTE — Op Note (Signed)
    Patient name: Edward Rocha. MRN: [id number] DOB: 07/24/1966 Sex: male  09/09/2019 Pre-operative Diagnosis: Acute kidney injury Post-operative diagnosis:  Same Surgeon:  Erlene Quan C. Donzetta Matters, MD Procedure Performed:   Ultrasound-guided placement of right IJ 19 cm tunneled dialysis catheter placement  Indications: 53 year old male has undergone fem-peroneal bypass.  He has acute kidney injury has not had renal recovery currently on dialysis via temporary IJ catheter.  He is indicated for tunnel catheter.   Procedure:  The patient was identified in the holding area and taken to the operating was placed supine upon table LMA anesthesia induced.  Antibiotics were minister timeout was called.  Ultrasound was used to identify the right IJ which was noted to be patent and compressible.  There is an existing catheter in the.  We cannulated this with 18-gauge needle.  A wire was passed centrally.  Wire tract was serially dilated 19 cm catheter was tunneled.  Introducer sheath was placed in fluoroscopic guidance.  Catheter was placed to the SVC atrial junction with smooth tapering to the shoulder.  Was assembled flushed with heparinized saline affixed the skin with 3-0 nylon suture.  Neck incision closed with 4 Monocryl.  Dermabond placed to both sites.  A temporary catheter was removed and pressure held until hemostasis obtained.  Catheter was locked with 1.5 cc concentrated heparin either port.  He was awakened anesthesia having tolerated procedure without immediate complication.  All counts were correct at completion.  EBL: 5 cc    Ryo Klang C. Donzetta Matters, MD [facility] [place] Office: [phone] Pager: [phone]

## 2019-09-09 NOTE — Plan of Care (Signed)
  Problem: Health Behavior/Discharge Planning: Goal: Ability to manage health-related needs will improve Outcome: Progressing   Problem: Clinical Measurements: Goal: Ability to maintain clinical measurements within normal limits will improve Outcome: Progressing Goal: Will remain free from infection Outcome: Progressing Goal: Diagnostic test results will improve Outcome: Progressing Goal: Respiratory complications will improve Outcome: Progressing Goal: Cardiovascular complication will be avoided Outcome: Progressing   Problem: Activity: Goal: Risk for activity intolerance will decrease Outcome: Progressing   Problem: Coping: Goal: Level of anxiety will decrease Outcome: Progressing   Problem: Elimination: Goal: Will not experience complications related to bowel motility Outcome: Progressing Goal: Will not experience complications related to urinary retention Outcome: Progressing   Problem: Safety: Goal: Ability to remain free from injury will improve Outcome: Progressing

## 2019-09-09 NOTE — Anesthesia Postprocedure Evaluation (Signed)
Anesthesia Post Note  Patient: Edward Rocha.  Procedure(s) Performed: INSERTION OF DIALYSIS CATHETER (Right ) Ultrasound Guidance For Vascular Access (Right ) Removal Of A Dialysis Catheter (Right )     Patient location during evaluation: PACU Anesthesia Type: General Level of consciousness: awake Pain management: pain level controlled Vital Signs Assessment: post-procedure vital signs reviewed and stable Respiratory status: spontaneous breathing, nonlabored ventilation, respiratory function stable and patient connected to nasal cannula oxygen Cardiovascular status: blood pressure returned to baseline and stable Postop Assessment: no apparent nausea or vomiting Anesthetic complications: no    Last Vitals:  Vitals:   09/09/19 1210 09/09/19 1228  BP:  105/60  Pulse: 91 90  Resp: 18 18  Temp:  36.8 C  SpO2: 98% 100%    Last Pain:  Vitals:   09/09/19 1228  TempSrc: Oral  PainSc: 0-No pain                 Ryan P Ellender

## 2019-09-09 NOTE — Progress Notes (Signed)
Nephrologist MD informed RN pt can be discharge; Dr. Trula Slade notified in [place] but no new order received; CM notified of pt potential DC and home DME equipments; Walker and 3-in-1 delivered to pt at bedside. Pt chest catheter dsg remains clean, dry and intact with small hematoma underneath remains unchanged. Pt foley remains intact and unclamped. MD to address foley either to remain or remove. Unit Charge nurse aware of pt potential dc and foley. Will continue to closely monitor. Delia Heady RN

## 2019-09-09 NOTE — Transfer of Care (Signed)
Immediate Anesthesia Transfer of Care Note  Patient: Edward Rocha.  Procedure(s) Performed: INSERTION OF DIALYSIS CATHETER (Right ) Ultrasound Guidance For Vascular Access (Right ) Removal Of A Dialysis Catheter (Right )  Patient Location: PACU  Anesthesia Type:General  Level of Consciousness: awake, alert  and oriented  Airway & Oxygen Therapy: Patient Spontanous Breathing  Post-op Assessment: Report given to RN and Post -op Vital signs reviewed and stable  Post vital signs: Reviewed and stable  Last Vitals:  Vitals Value Taken Time  BP 134/62 09/09/19 0813  Temp    Pulse 87 09/09/19 0814  Resp 16 09/09/19 0814  SpO2 92 % 09/09/19 0814    Last Pain:  Vitals:   09/09/19 0457  TempSrc: Oral  PainSc:       Patients Stated Pain Goal: 5 ([id number])  Complications: No apparent anesthesia complications

## 2019-09-09 NOTE — Progress Notes (Signed)
  Progress Note    09/09/2019 7:29 AM Day of Surgery  Subjective:  No overnight issues  Vitals:   09/09/19 0010 09/09/19 0457  BP: (!) 130/49 (!) 151/54  Pulse: 96 94  Resp: 12 (!) 22  Temp: 98.5 F (36.9 C) 99.4 F (37.4 C)  SpO2: 96% 95%    Physical Exam: aaox3 Catheter in place right neck  CBC    Component Value Date/Time   WBC 5.7 09/08/2019 1403   RBC 2.79 (L) 09/08/2019 1403   HGB 8.1 (L) 09/08/2019 1403   HCT 25.1 (L) 09/08/2019 1403   PLT 42 (L) 09/08/2019 1403   MCV 90.0 09/08/2019 1403   MCH 29.0 09/08/2019 1403   MCHC 32.3 09/08/2019 1403   RDW 17.2 (H) 09/08/2019 1403   LYMPHSABS 3.5 08/26/2019 0500   MONOABS 2.2 (H) 08/26/2019 0500   EOSABS 0.4 08/26/2019 0500   BASOSABS 0.1 08/26/2019 0500    BMET    Component Value Date/Time   NA 133 (L) 09/09/2019 0221   K 3.4 (L) 09/09/2019 0221   CL 100 09/09/2019 0221   CO2 24 09/09/2019 0221   GLUCOSE 80 09/09/2019 0221   BUN 15 09/09/2019 0221   CREATININE 4.34 (H) 09/09/2019 0221   CALCIUM 7.6 (L) 09/09/2019 0221   GFRNONAA 14 (L) 09/09/2019 0221   GFRAA 17 (L) 09/09/2019 0221    INR    Component Value Date/Time   INR 1.7 (H) 08/27/2019 0351     Intake/Output Summary (Last 24 hours) at 09/09/2019 0729 Last data filed at 09/08/2019 1322 Gross per 24 hour  Intake -  Output 2200 ml  Net -2200 ml     Assessment/plan:  53 y.o. male is s/p fem-peroneal bypass now requiring hd. Plan for tdc today.    Dariel Betzer C. Donzetta Matters, MD [facility] [place] Office: [phone] Pager: [phone]  09/09/2019 7:29 AM

## 2019-09-09 NOTE — Progress Notes (Signed)
Patient has been accepted for OP HD treatment at [facility] on a TTS scheduled with a seat time of 12:15pm. He needs to arrive 20 minutes early to his appointments. The Clinic is prepared to start patient this Thursday if he is cleared for discharged prior to then. He will need to arrive at 10:35am to complete intake paperwork prior to his first treatment. Renal Navigator notified Nephrology. Renal Navigator will meet with patient to discuss once he is out of PACU today.  Edward Rocha, [place] Renal Navigator [phone]

## 2019-09-09 NOTE — Progress Notes (Signed)
Renal Navigator met with patient and called patient's wife at his request to inform of OP HD clinic schedule. Patient cleared for discharge today from Nephrology/Dr. Joelyn Oms and OP HD standpoints. He had HD yesterday and can have his next treatment in the clinic on Thursday. Patient states he is eager to go home and is hopeful that discharge will be today.  Renal Navigator informed patient's bedside RN and Dr. Joelyn Oms messaged Attending.  Edward Rocha, [place] Renal Navigator [phone]

## 2019-09-09 NOTE — Progress Notes (Signed)
OT Cancellation Note  Patient Details Name: Edward Rocha. MRN: [id number] DOB: 1966-08-29   Cancelled Treatment:    Reason Eval/Treat Not Completed: Patient at procedure or test/ unavailable.  For IJ catheter.  Abdalla Naramore 09/09/2019, 11:18 AM  Lesle Chris, OTR/L Acute Rehabilitation Services [phone] [facility] pager [phone] office 09/09/2019

## 2019-09-09 NOTE — Progress Notes (Addendum)
Spoke with [facility] to update that pt was potential d/c today and to re-initiate referral for HHPT- intake reference # [id number]- they will reach out to [facility] agencies to try to [place] services for pt- have requested to have [place] by 11/12- per Marquena at the [facility] with [facility]- pt has been approved for HHPT- they will reach out to [facility] to see if they will accept referral.  Auth # for initial visit is [id number] Auth # for additional 6 visits is [id number]

## 2019-09-10 ENCOUNTER — Encounter (HOSPITAL_COMMUNITY): Payer: Self-pay | Admitting: Vascular Surgery

## 2019-09-10 LAB — GLUCOSE, CAPILLARY
Glucose-Capillary: 205 mg/dL — ABNORMAL HIGH (ref 70–99)
Glucose-Capillary: 160 mg/dL — ABNORMAL HIGH (ref 70–99)

## 2019-09-10 LAB — MAGNESIUM: Magnesium: 1.8 mg/dL (ref 1.7–2.4)

## 2019-09-10 LAB — RENAL FUNCTION PANEL
Albumin: 1.8 g/dL — ABNORMAL LOW (ref 3.5–5.0)
Anion gap: 9 (ref 5–15)
BUN: 26 mg/dL — ABNORMAL HIGH (ref 6–20)
CO2: 21 mmol/L — ABNORMAL LOW (ref 22–32)
Calcium: 7.7 mg/dL — ABNORMAL LOW (ref 8.9–10.3)
Chloride: 100 mmol/L (ref 98–111)
Creatinine, Ser: 5.09 mg/dL — ABNORMAL HIGH (ref 0.61–1.24)
GFR calc Af Amer: 14 mL/min — ABNORMAL LOW (ref >60)
GFR calc non Af Amer: 12 mL/min — ABNORMAL LOW (ref >60)
Glucose, Bld: 278 mg/dL — ABNORMAL HIGH (ref 70–99)
Phosphorus: 4.2 mg/dL (ref 2.5–4.6)
Potassium: 4.3 mmol/L (ref 3.5–5.1)
Sodium: 130 mmol/L — ABNORMAL LOW (ref 135–145)

## 2019-09-10 MED ORDER — OXYCODONE-ACETAMINOPHEN 5-325 MG PO TABS: 1.0000 | ORAL_TABLET | Freq: Four times a day (QID) | ORAL | 0 refills | Status: DC | PRN

## 2019-09-10 NOTE — Plan of Care (Signed)
  Problem: Health Behavior/Discharge Planning: Goal: Ability to manage health-related needs will improve Outcome: Progressing   Problem: Clinical Measurements: Goal: Ability to maintain clinical measurements within normal limits will improve Outcome: Progressing Goal: Will remain free from infection Outcome: Progressing Goal: Diagnostic test results will improve Outcome: Progressing Goal: Respiratory complications will improve Outcome: Progressing Goal: Cardiovascular complication will be avoided Outcome: Progressing   Problem: Activity: Goal: Risk for activity intolerance will decrease Outcome: Progressing   Problem: Coping: Goal: Level of anxiety will decrease Outcome: Progressing   Problem: Elimination: Goal: Will not experience complications related to bowel motility Outcome: Progressing Goal: Will not experience complications related to urinary retention Outcome: Progressing   Problem: Safety: Goal: Ability to remain free from injury will improve Outcome: Progressing

## 2019-09-10 NOTE — Progress Notes (Signed)
Discharge instructions given.  Discussed follow up appointments, signs and symptoms to watch for and when to contact the physician.  Discussed HD starting tomorrow.  Discussed new medications, medication changes and signs and symptoms to watch for.  Discussed activities.  Verbalized understanding.

## 2019-09-10 NOTE — Progress Notes (Signed)
Physical Therapy Treatment Patient Details Name: Edward Rocha. MRN: [id number] DOB: [birth year] Today's Date: 09/10/2019    History of Present Illness 53 y.o. male is s/p L femoral to popliteal bypass and iliac stenting 10 Days Post-Op . Pt with 2 intubations this admission with recent extubation on 10/29, on CRRT until 10/26, IJ placed 11/10 and pt will now be on HD.      PT Comments    Patient received in bed asleep but easily woken with verbal and tactile stimulation today, motivated to participate in PT session this morning. Able to complete bed mobility with Mod(I), functional transfers with S and RW with cues for safety and sequencing, and tolerated progression of gait to 121f with RW and S with cues for safety/spacing from device. VSS on room air. He was left in bed with all needs met this morning.     Follow Up Recommendations  Home health PT     Equipment Recommendations  Rolling walker with 5" wheels    Recommendations for Other Services       Precautions / Restrictions Precautions Precautions: Fall Precaution Comments: monitor O2 Restrictions Weight Bearing Restrictions: No    Mobility  Bed Mobility Overal bed mobility: Independent Bed Mobility: Supine to Sit;Sit to Supine     Supine to sit: Modified independent (Device/Increase time) Sit to supine: Modified independent (Device/Increase time)   General bed mobility comments: no physical assist given  Transfers Overall transfer level: Needs assistance Equipment used: Rolling walker (2 wheeled) Transfers: Sit to/from Stand Sit to Stand: Supervision         General transfer comment: S for safety, cues for hand placement, no physical assist given  Ambulation/Gait Ambulation/Gait assistance: Supervision Gait Distance (Feet): 110 Feet Assistive device: Rolling walker (2 wheeled) Gait Pattern/deviations: Step-to pattern;Decreased step length - right;Decreased step length - left;Trunk flexed;Decreased  stride length Gait velocity: reduced   General Gait Details: slow but steady with RW, VSS on room air, S for safety with no physical assist given   Stairs             Wheelchair Mobility    Modified Rankin (Stroke Patients Only)       Balance Overall balance assessment: Needs assistance Sitting-balance support: No upper extremity supported;Feet supported Sitting balance-Leahy Scale: Normal     Standing balance support: No upper extremity supported Standing balance-Leahy Scale: Fair Standing balance comment: intermittently throughout session                            Cognition Arousal/Alertness: Awake/alert Behavior During Therapy: WFL for tasks assessed/performed Overall Cognitive Status: Within Functional Limits for tasks assessed                                        Exercises      General Comments        Pertinent Vitals/Pain Pain Assessment: No/denies pain Pain Score: 0-No pain Faces Pain Scale: No hurt Pain Intervention(s): Limited activity within patient's tolerance;Monitored during session    Home Living                      Prior Function            PT Goals (current goals can now be found in the care plan section) Acute Rehab PT Goals Patient Stated Goal: improve mobility  PT Goal Formulation: With patient Time For Goal Achievement: 09/15/19 Potential to Achieve Goals: Good Progress towards PT goals: Progressing toward goals    Frequency    Min 3X/week      PT Plan Current plan remains appropriate    Co-evaluation              AM-PAC PT "6 Clicks" Mobility   Outcome Measure  Help needed turning from your back to your side while in a flat bed without using bedrails?: None Help needed moving from lying on your back to sitting on the side of a flat bed without using bedrails?: None Help needed moving to and from a bed to a chair (including a wheelchair)?: None Help needed standing up  from a chair using your arms (e.g., wheelchair or bedside chair)?: None Help needed to walk in hospital room?: None Help needed climbing 3-5 steps with a railing? : A Little 6 Click Score: 23    End of Session   Activity Tolerance: Patient tolerated treatment well Patient left: in bed;with call bell/phone within reach   PT Visit Diagnosis: Muscle weakness (generalized) (M62.81) Pain - Right/Left: (bottom) Pain - part of body: (buttocks)     Time: 1032-1047 PT Time Calculation (min) (ACUTE ONLY): 15 min  Charges:  $Gait Training: 8-22 mins                     Kristen U PT, DPT, CBIS  Supplemental Physical Therapist [place]    Pager [phone] Acute Rehab Office [phone]    

## 2019-09-10 NOTE — Progress Notes (Addendum)
[facility] [place]  Subjective  - Doing well and ready to go home.   Objective 121/65 (!) 104 98.8 F (37.1 C) (Oral) 20 96%  Intake/Output Summary (Last 24 hours) at 09/10/2019 0716 Last data filed at 09/10/2019 0300 Gross per 24 hour  Intake 1443.96 ml  Output 210 ml  Net 1233.96 ml    Brisk DP doppler left LE, mild edema in the foot.  Incisions healing well.  Left groin with minimal separation superficially.  Dr Homero Fellers placed over groin incision. Lungs non labored breathing HR tachy at 104-119 BP stable 121/65 Right TDC without bleeding   Assessment/Planning: 53 y.o.maleis s/p L femoral to popliteal bypass and iliac stenting  Plan to discharge home today.  He has OP HD schedule today. Has home equipment with [facility] PT ordered. Dry dressing daily over left groin incision.   Activity as tolerates. F/U in 2-3 weeks with ABI's  [name] 09/10/2019 7:16 AM --  Laboratory Lab Results: Recent Labs    09/08/19 1403 09/09/19 1337  WBC 5.7 3.5*  HGB 8.1* 8.7*  HCT 25.1* 28.4*  PLT 42* 50*   BMET Recent Labs    09/09/19 1337 09/10/19 0218  NA 135 130*  K 4.8 4.3  CL 102 100  CO2 24 21*  GLUCOSE 173* 278*  BUN 18 26*  CREATININE 4.47* 5.09*  CALCIUM 7.9* 7.7*    COAG Lab Results  Component Value Date   INR 1.7 (H) 08/27/2019   INR 1.4 (H) 08/22/2019   INR 1.5 (H) 08/22/2019   No results found for: PTT  I have examined the patient, reviewed and agree with above.  Curt Jews, MD 09/10/2019 8:34 AM

## 2019-09-10 NOTE — TOC Transition Note (Signed)
Transition of Care [facility]) - CM/SW Discharge Note Marvetta Gibbons RN, BSN Transitions of Care Unit 4E- RN Case Manager [phone]   Patient Details  Name: Edward Rocha. MRN: [id number] Date of Birth: 03/02/1966  [facility] of [facility] CM/SW Contact:  Dawayne Patricia, RN Phone Number: 09/10/2019, 2:50 PM   Clinical Narrative:    Pt stable for transition home today, HHPT is being worked on through [employer] for contract with [facility] agency- call made to multiple agencies to see if they would accept with auth # in hand with no success- [facility]- no [facility]- no [facility]- no [facility]- no [facility]- no [facility]- no [facility]- no [facility]- no  Call made back to [employer]- spoke with Caryl Pina who states that staffing dept. With them is still working to get [facility] services in place for pt. - Have also called Butch Penny CM with [facility] to see if she can offer any assistance. TC made to wife to update on [facility] services situation.    Final next level of care: [place] Barriers to Discharge: Barriers Resolved   Patient Goals and CMS Choice Patient states their goals for this hospitalization and ongoing recovery are:: "him to get better"-per spouse CMS Medicare.gov Compare Post Acute Care list provided to:: Patient Represenative (must comment)(spouse) Choice offered to / list presented to : Spouse  Discharge Placement             Home with [facility]          Discharge Plan and Services In-house Referral: NA Discharge Planning Services: CM Consult Post Acute Care Choice: [place]          DME Arranged: 3-N-1, Walker rolling DME Agency: [employer] Date DME Agency Contacted: 08/14/19 Time DME Agency Contacted: [phone] Representative spoke with at DME Agency: [place]: PT [place]: Other - See comment Date [place]: 08/14/19 Time [place]: [place] Representative spoke with at [place] ([place]) Interventions     Readmission Risk Interventions No flowsheet data found.

## 2019-09-10 NOTE — Progress Notes (Signed)
[facility] NEPHROLOGY PROGRESS NOTE  Assessment/ Plan: Pt is a 53 y.o. yo male  W/ a PMH of DM, HTN, HLD, thrombocytopenia, PAD s/p aortogram, iliac stenting, and fem-pop bypass surgery on 10/23, who developed oliguric AKI requiring CRRT from 10/18 -10/21 and has been receiving intermittent HD since 10/24, now accepted to o/p dialysis center.    # AKI - likely 2/2 ischemic ATN in the setting of hypotension and acute blood loss anemia concomitant with contrast nephropathy.with no improvement in kidney function, a tunneled HD cath was placed and he was set up in an o/p dialysis center for [facility] at [place].  - ok to discharge from renal standpoint.  - continue ESA for anemia.  - hold dialysis today for potential discharge and plan for o/p dialysis on Thursday.   # HTN: on labetalol and nifedipine.  Slightly elevated. Stable. Continue to monitor.   # hypokalemia/hypomagnesemia: normal today - has PRN order for 2g Mg for levels < 1.7.    # acute blood loss anemia: required transfusion.  Iron sat 44%, continue ESA.  Hgb stable but low.  - continue to monitor. Managed per primary team.   #thrombocytopenia: platelet count stable ~45k. Has been stable in this range since at least July. A cause does not appear to have been found, possibly ITP. Consider outpatient referral to heme.      Subjective:   Patient feels great today, ready to go home.  No bleeding episodes last night.    Objective General:NAD, comfortable. Laying in bed Heart:RRR, no murmur.  Lungs:clear b/l, no crackles.  Extremities:1+ edema on LLE  Dialysis Access: right tunneled HD cath. No bleeding.  Skin: L inguinal central line site appears normal. No pus, no erythema/swelling/pain  Vital signs in last 24 hours: Vitals:   09/09/19 2353 09/10/19 0015 09/10/19 0338 09/10/19 0350  BP: (!) 160/59 113/67 121/65   Pulse: (!) 101 (!) 101 (!) 104   Resp: (!) 21 (!) 23 20   Temp: 98.8 F (37.1 C)  98.8 F  (37.1 C)   TempSrc: Oral  Oral   SpO2: 96% 96% 96%   Weight:    86.1 kg  Height:       Weight change: -2.062 kg  Intake/Output Summary (Last 24 hours) at 09/10/2019 0716 Last data filed at 09/10/2019 0300 Gross per 24 hour  Intake 1443.96 ml  Output 210 ml  Net 1233.96 ml    Labs: Basic Metabolic Panel: Recent Labs  Lab 09/09/19 0221 09/09/19 1337 09/10/19 0218  NA 133* 135 130*  K 3.4* 4.8 4.3  CL 100 102 100  CO2 24 24 21*  GLUCOSE 80 173* 278*  BUN 15 18 26*  CREATININE 4.34* 4.47* 5.09*  CALCIUM 7.6* 7.9* 7.7*  PHOS 2.9 4.9* 4.2   Liver Function Tests: Recent Labs  Lab 09/09/19 0221 09/09/19 1337 09/10/19 0218  ALBUMIN 1.9* 2.0* 1.8*   No results for input(s): LIPASE, AMYLASE in the last 168 hours. Recent Labs  Lab 09/03/19 0848 09/05/19 0510  AMMONIA 39* 42*   CBC: Recent Labs  Lab 09/05/19 0510 09/08/19 1403 09/09/19 1337  WBC 7.2 5.7 3.5*  HGB 7.1* 8.1* 8.7*  HCT 23.8* 25.1* 28.4*  MCV 96.0 90.0 92.2  PLT 53* 42* 50*   CBG: Recent Labs  Lab 09/09/19 0814 09/09/19 1311 09/09/19 1603 09/09/19 2203 09/10/19 0602  GLUCAP 76 158* 267* 331* 205*    Medications: Infusions: . sodium chloride 0 mL/hr at 08/22/19 1312  .  sodium chloride 35 mL (08/22/19 2239)  . sodium chloride    . sodium chloride    . sodium chloride    . sodium chloride    . sodium chloride    . sodium chloride      Scheduled Medications: . atorvastatin  80 mg Oral Daily  . Chlorhexidine Gluconate Cloth  6 each Topical Daily  . darbepoetin (ARANESP) injection - DIALYSIS  60 mcg Intravenous Q Mon-HD  . fluconazole  50 mg Oral Daily  . folic acid  1 mg Oral Daily  . Gerhardt's butt cream   Topical Daily  . heparin injection (subcutaneous)  5,000 Units Subcutaneous Q8H  . insulin aspart  0-9 Units Subcutaneous TID WC  . insulin glargine  15 Units Subcutaneous QPM  . labetalol  300 mg Oral BID  . lactulose  30 g Oral BID  . multivitamin with minerals  1 tablet  Oral Daily  . NIFEdipine  90 mg Oral Daily  . pantoprazole  40 mg Oral Daily  . sodium chloride flush  10-40 mL Intracatheter Q12H  . sodium chloride flush  3 mL Intravenous Q12H  . thiamine  100 mg Oral Daily  . vitamin B-12  1,000 mcg Oral Daily    have reviewed scheduled and prn medications.   Benay Pike 09/10/2019,7:16 AM  LOS: 30 days

## 2019-09-10 NOTE — Progress Notes (Signed)
Occupational Therapy Treatment Patient Details Name: Edward Rocha. MRN: [id number] DOB: 1966-07-12 Today's Date: 09/10/2019    History of present illness 53 y.o. male is s/p L femoral to popliteal bypass and iliac stenting 10 Days Post-Op . Pt with 2 intubations this admission with recent extubation on 10/29, on CRRT until 10/26, IJ placed 11/10 and pt will now be on HD.     OT comments  Pt demonstrating ability to toilet and stand at sink for grooming with supervision. Can access feet for bathing and dressing. Pt to return home today. 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   Follow Up Recommendations  Home health OT    Equipment Recommendations  3 in 1 bedside commode    Recommendations for Other Services      Precautions / Restrictions Precautions Precautions: Fall       Mobility Bed Mobility Overal bed mobility: Independent             General bed mobility comments: HOB flat  Transfers Overall transfer level: Needs assistance Equipment used: Rolling walker (2 wheeled) Transfers: Sit to/from Stand Sit to Stand: Supervision         General transfer comment: no physical assist from bed or 3in1    Balance     Sitting balance-Leahy Scale: Good     Standing balance support: No upper extremity supported Standing balance-Leahy Scale: Fair Standing balance comment: statically at sink                           ADL either performed or assessed with clinical judgement   ADL Overall ADL's : Needs assistance/impaired     Grooming: Wash/dry hands;Wash/dry face;Standing;Supervision/safety           Upper Body Dressing : Set up;Sitting   Lower Body Dressing: Supervision/safety;Sit to/from stand   Toilet Transfer: Supervision/safety;Ambulation;RW;BSC Toilet Transfer Details (indicate cue type and reason): educated pt in use of 3 in 1 over toilet at home        Tub/Shower Transfer Details (indicate cue type and reason): recommended seated showering, wife has a shower seat, pt plans to add grab bars Functional mobility during ADLs: Supervision/safety;Rolling walker       Vision       Perception     Praxis      Cognition Arousal/Alertness: Awake/alert Behavior During Therapy: WFL for tasks assessed/performed Overall Cognitive Status: Within Functional Limits for tasks assessed                                          Exercises     Shoulder Instructions       General Comments      Pertinent Vitals/ Pain       Pain Assessment: No/denies pain  Home Living                                          Prior Functioning/Environment              Frequency  Min 2X/week        Progress Toward Goals  OT Goals(current goals can now be found in the care plan section)  Progress  towards OT goals: Progressing toward goals  Acute Rehab OT Goals Patient Stated Goal: improve mobility OT Goal Formulation: With patient Time For Goal Achievement: 09/16/19 Potential to Achieve Goals: Good  Plan Discharge plan needs to be updated    Co-evaluation                 AM-PAC OT "6 Clicks" Daily Activity     Outcome Measure   Help from another person eating meals?: None Help from another person taking care of personal grooming?: A Little Help from another person toileting, which includes using toliet, bedpan, or urinal?: A Little Help from another person bathing (including washing, rinsing, drying)?: A Little Help from another person to put on and taking off regular upper body clothing?: None Help from another person to put on and taking off regular lower body clothing?: A Little 6 Click Score: 20    End of Session Equipment Utilized During Treatment: Rolling walker  OT Visit Diagnosis: Other abnormalities of gait and mobility (R26.89);Muscle weakness (generalized) (M62.81)   Activity  Tolerance Patient tolerated treatment well   Patient Left in bed;with call bell/phone within reach(with MD)   Nurse Communication          [id number] OT Time Calculation (min): 17 min  Charges: OT General Charges $OT Visit: 1 Visit OT Treatments $Self Care/Home Management : 8-22 mins  Nestor Lewandowsky, OTR/L Acute Rehabilitation Services Pager: [phone] Office: [phone]   Malka So 09/10/2019, 8:45 AM

## 2019-09-10 NOTE — Plan of Care (Signed)
  Problem: Health Behavior/Discharge Planning: Goal: Ability to manage health-related needs will improve 09/10/2019 1304 by Glenard Haring, RN Outcome: Adequate for Discharge 09/10/2019 0956 by Glenard Haring, RN Outcome: Progressing   Problem: Clinical Measurements: Goal: Ability to maintain clinical measurements within normal limits will improve 09/10/2019 1304 by Glenard Haring, RN Outcome: Adequate for Discharge 09/10/2019 0956 by Glenard Haring, RN Outcome: Progressing Goal: Will remain free from infection 09/10/2019 1304 by Glenard Haring, RN Outcome: Adequate for Discharge 09/10/2019 0956 by Glenard Haring, RN Outcome: Progressing Goal: Diagnostic test results will improve 09/10/2019 1304 by Glenard Haring, RN Outcome: Adequate for Discharge 09/10/2019 0956 by Glenard Haring, RN Outcome: Progressing Goal: Respiratory complications will improve 09/10/2019 1304 by Glenard Haring, RN Outcome: Adequate for Discharge 09/10/2019 0956 by Glenard Haring, RN Outcome: Progressing Goal: Cardiovascular complication will be avoided 09/10/2019 1304 by Glenard Haring, RN Outcome: Adequate for Discharge 09/10/2019 0956 by Glenard Haring, RN Outcome: Progressing   Problem: Activity: Goal: Risk for activity intolerance will decrease 09/10/2019 1304 by Glenard Haring, RN Outcome: Adequate for Discharge 09/10/2019 0956 by Glenard Haring, RN Outcome: Progressing   Problem: Coping: Goal: Level of anxiety will decrease 09/10/2019 1304 by Glenard Haring, RN Outcome: Adequate for Discharge 09/10/2019 0956 by Glenard Haring, RN Outcome: Progressing   Problem: Elimination: Goal: Will not experience complications related to bowel motility 09/10/2019 1304 by Glenard Haring, RN Outcome: Adequate for Discharge 09/10/2019 0956 by Glenard Haring, RN Outcome: Progressing Goal: Will not experience complications related to urinary  retention 09/10/2019 1304 by Glenard Haring, RN Outcome: Adequate for Discharge 09/10/2019 0956 by Glenard Haring, RN Outcome: Progressing   Problem: Safety: Goal: Ability to remain free from injury will improve 09/10/2019 1304 by Glenard Haring, RN Outcome: Adequate for Discharge 09/10/2019 0956 by Glenard Haring, RN Outcome: Progressing

## 2019-09-19 ENCOUNTER — Telehealth: Payer: Self-pay | Admitting: Vascular Surgery

## 2019-09-19 LAB — AEROBIC BACTERIA, ID BY SEQ.

## 2019-09-19 NOTE — Telephone Encounter (Signed)
A Rep from [facility] called on behalf of the patient requesting PT be done on an outpatient basis due to trouble finding companies who will work with the patient's insurance.  I faxed an order and demographics to [facility] in [place].  They will contact the patient if they can assist him.  Thurston Hole., LPN

## 2019-09-20 ENCOUNTER — Other Ambulatory Visit: Payer: Self-pay

## 2019-09-20 ENCOUNTER — Emergency Department (HOSPITAL_COMMUNITY): Payer: Managed Care, Other (non HMO)

## 2019-09-20 ENCOUNTER — Encounter (HOSPITAL_COMMUNITY): Payer: Self-pay

## 2019-09-20 ENCOUNTER — Inpatient Hospital Stay (HOSPITAL_COMMUNITY)
Admission: EM | Admit: 2019-09-20 | Discharge: 2019-09-23 | DRG: 441 | Disposition: A | Discharge status: Home / Self Care | Payer: Managed Care, Other (non HMO) | Attending: Internal Medicine | Admitting: Internal Medicine

## 2019-09-20 DIAGNOSIS — Z6827 Body mass index (BMI) 27.0-27.9, adult: Secondary | ICD-10-CM

## 2019-09-20 DIAGNOSIS — K7200 Acute and subacute hepatic failure without coma: Principal | ICD-10-CM | POA: Diagnosis present

## 2019-09-20 DIAGNOSIS — E46 Unspecified protein-calorie malnutrition: Secondary | ICD-10-CM | POA: Diagnosis present

## 2019-09-20 DIAGNOSIS — G9341 Metabolic encephalopathy: Secondary | ICD-10-CM | POA: Diagnosis present

## 2019-09-20 DIAGNOSIS — N186 End stage renal disease: Secondary | ICD-10-CM | POA: Diagnosis present

## 2019-09-20 DIAGNOSIS — Z992 Dependence on renal dialysis: Secondary | ICD-10-CM

## 2019-09-20 DIAGNOSIS — D696 Thrombocytopenia, unspecified: Secondary | ICD-10-CM | POA: Diagnosis present

## 2019-09-20 DIAGNOSIS — E872 Acidosis: Secondary | ICD-10-CM | POA: Diagnosis present

## 2019-09-20 DIAGNOSIS — Z809 Family history of malignant neoplasm, unspecified: Secondary | ICD-10-CM

## 2019-09-20 DIAGNOSIS — D631 Anemia in chronic kidney disease: Secondary | ICD-10-CM | POA: Diagnosis present

## 2019-09-20 DIAGNOSIS — Z20828 Contact with and (suspected) exposure to other viral communicable diseases: Secondary | ICD-10-CM | POA: Diagnosis present

## 2019-09-20 DIAGNOSIS — E785 Hyperlipidemia, unspecified: Secondary | ICD-10-CM | POA: Diagnosis present

## 2019-09-20 DIAGNOSIS — E1122 Type 2 diabetes mellitus with diabetic chronic kidney disease: Secondary | ICD-10-CM | POA: Diagnosis present

## 2019-09-20 DIAGNOSIS — Z794 Long term (current) use of insulin: Secondary | ICD-10-CM

## 2019-09-20 DIAGNOSIS — Z7902 Long term (current) use of antithrombotics/antiplatelets: Secondary | ICD-10-CM

## 2019-09-20 DIAGNOSIS — E876 Hypokalemia: Secondary | ICD-10-CM | POA: Diagnosis present

## 2019-09-20 DIAGNOSIS — I120 Hypertensive chronic kidney disease with stage 5 chronic kidney disease or end stage renal disease: Secondary | ICD-10-CM | POA: Diagnosis present

## 2019-09-20 DIAGNOSIS — K7030 Alcoholic cirrhosis of liver without ascites: Secondary | ICD-10-CM | POA: Diagnosis present

## 2019-09-20 DIAGNOSIS — E039 Hypothyroidism, unspecified: Secondary | ICD-10-CM | POA: Diagnosis present

## 2019-09-20 DIAGNOSIS — R4182 Altered mental status, unspecified: Secondary | ICD-10-CM | POA: Diagnosis present

## 2019-09-20 DIAGNOSIS — G9340 Encephalopathy, unspecified: Secondary | ICD-10-CM

## 2019-09-20 DIAGNOSIS — E7220 Disorder of urea cycle metabolism, unspecified: Secondary | ICD-10-CM

## 2019-09-20 DIAGNOSIS — E871 Hypo-osmolality and hyponatremia: Secondary | ICD-10-CM | POA: Diagnosis present

## 2019-09-20 DIAGNOSIS — Z87891 Personal history of nicotine dependence: Secondary | ICD-10-CM

## 2019-09-20 DIAGNOSIS — L03213 Periorbital cellulitis: Secondary | ICD-10-CM | POA: Diagnosis not present

## 2019-09-20 DIAGNOSIS — E1151 Type 2 diabetes mellitus with diabetic peripheral angiopathy without gangrene: Secondary | ICD-10-CM | POA: Diagnosis present

## 2019-09-20 DIAGNOSIS — Z811 Family history of alcohol abuse and dependence: Secondary | ICD-10-CM

## 2019-09-20 HISTORY — DX: Atherosclerotic heart disease of native coronary artery without angina pectoris: I25.10

## 2019-09-20 HISTORY — DX: Heart failure, unspecified: I50.9

## 2019-09-20 HISTORY — DX: Gastro-esophageal reflux disease without esophagitis: K21.9

## 2019-09-20 LAB — LACTIC ACID, PLASMA
Lactic Acid, Venous: 2.7 mmol/L (ref 0.5–1.9)
Lactic Acid, Venous: 1.8 mmol/L (ref 0.5–1.9)

## 2019-09-20 LAB — COMPREHENSIVE METABOLIC PANEL
ALT: 22 U/L (ref 0–44)
AST: 41 U/L (ref 15–41)
Albumin: 2.2 g/dL — ABNORMAL LOW (ref 3.5–5.0)
Alkaline Phosphatase: 78 U/L (ref 38–126)
Anion gap: 9 (ref 5–15)
BUN: 25 mg/dL — ABNORMAL HIGH (ref 6–20)
CO2: 24 mmol/L (ref 22–32)
Calcium: 8.3 mg/dL — ABNORMAL LOW (ref 8.9–10.3)
Chloride: 100 mmol/L (ref 98–111)
Creatinine, Ser: 2.36 mg/dL — ABNORMAL HIGH (ref 0.61–1.24)
GFR calc Af Amer: 35 mL/min — ABNORMAL LOW (ref >60)
GFR calc non Af Amer: 30 mL/min — ABNORMAL LOW (ref >60)
Glucose, Bld: 123 mg/dL — ABNORMAL HIGH (ref 70–99)
Potassium: 3.8 mmol/L (ref 3.5–5.1)
Sodium: 133 mmol/L — ABNORMAL LOW (ref 135–145)
Total Bilirubin: 1.9 mg/dL — ABNORMAL HIGH (ref 0.3–1.2)
Total Protein: 6.9 g/dL (ref 6.5–8.1)

## 2019-09-20 LAB — CBC
HCT: 25.3 % — ABNORMAL LOW (ref 39.0–52.0)
Hemoglobin: 8 g/dL — ABNORMAL LOW (ref 13.0–17.0)
MCH: 27.9 pg (ref 26.0–34.0)
MCHC: 31.6 g/dL (ref 30.0–36.0)
MCV: 88.2 fL (ref 80.0–100.0)
Platelets: 63 10*3/uL — ABNORMAL LOW (ref 150–400)
RBC: 2.87 MIL/uL — ABNORMAL LOW (ref 4.22–5.81)
RDW: 15.9 % — ABNORMAL HIGH (ref 11.5–15.5)
WBC: 4.7 10*3/uL (ref 4.0–10.5)
nRBC: 0 % (ref 0.0–0.2)

## 2019-09-20 LAB — CBG MONITORING, ED: Glucose-Capillary: 123 mg/dL — ABNORMAL HIGH (ref 70–99)

## 2019-09-20 LAB — ETHANOL: Alcohol, Ethyl (B): <10 mg/dL (ref <10)

## 2019-09-20 LAB — AMMONIA: Ammonia: 53 umol/L — ABNORMAL HIGH (ref 9–35)

## 2019-09-20 MED ORDER — LACTULOSE 10 GM/15ML PO SOLN
30.0000 g | Freq: Once | ORAL | Status: AC
  Administered 2019-09-20: 20:00:00 30 g via ORAL
  Filled 2019-09-20: qty 60

## 2019-09-20 NOTE — ED Notes (Addendum)
Call to Edward Rocha- spouse  [phone]  She asks, "Did they tell you his ammonia level might be up. It makes him loopy"  She is given an update   Pt is returned fro [facility]

## 2019-09-20 NOTE — ED Notes (Signed)
Lab in to draw

## 2019-09-20 NOTE — ED Triage Notes (Addendum)
Pt from home. Pt had dialysis Thursday and was supposed to have it today, but wife forgot to take him. Wife noted confusion when he went to go open the front door, but pt could not remember how to. Pt noted to constantly scratch RT leg. Pt usually AOx4. CBG 134 en route.

## 2019-09-20 NOTE — ED Notes (Signed)
Wife called EMS after spouse noted to be confused  He missed dialysis today because "they forgot"  usual dialysis [facility] [place], [place]  Pt is confused to day, place and name

## 2019-09-20 NOTE — ED Provider Notes (Addendum)
[facility] EMERGENCY DEPARTMENT Provider Note   CSN: [id number] Arrival date & time: 09/20/19  1651     History   Chief Complaint Chief Complaint  Patient presents with  . Altered Mental Status    HPI Edward Hi. is a 53 y.o. male.  Please emerge department with concern for altered mental status.  History primarily taken from wife.  At time of interview, patient denies any acute complaints, denies any pain anywhere.  Denies feeling any confusion, no numbness, weakness, vision changes.  Wife reports that yesterday patient was in his regular state of health, no issues.  Today throughout the day she noted intermittent episodes of confusion, patient having some issues with remembering details, had difficulty getting back into the house from outside.  No fever, no complaints of neck pain, neck stiffness.  History limited due to altered mental status, level 5 history.     HPI  Past Medical History:  Diagnosis Date  . Acute renal failure (ARF) ([place])    12/2018 when admitted for hyperosmolar hyperglycemic nonketotic syndrome  . Bilateral pain of leg and foot   . Diabetes mellitus without complication ([place])    Type II  . Dizziness   . Elevated LFTs 05/2019  . Frequent urination   . Hyperglycemia    Non-Ketotic Hyperosmolar.  . Hyperlipidemia   . Hypernatremia   . Hypertension   . Peripheral vascular disease ([place])   . Syncope    12/2018, diagnosed with hyperosmolar hyperglycemic nonketotic syndrome, AKI, hyponatremia ([facility])  . Thrombocytopenia ([place])   . Thyroid disease    Hypothyroidism  . Weakness     Patient Active Problem List   Diagnosis Date Noted  . Pressure injury of skin 08/27/2019  . Acute respiratory failure ([place])   . Aspiration into airway   . Acute encephalopathy   . Gastrointestinal hemorrhage   . Endotracheal tube present   . Nasogastric tube present   . PAD (peripheral artery disease) ([place]) 08/11/2019  . Thrombocytopenia ([place]) 05/29/2019   . Diabetes ([place]) 05/29/2019  . Critical lower limb ischemia 04/22/2019    Past Surgical History:  Procedure Laterality Date  . ABDOMINAL AORTOGRAM W/LOWER EXTREMITY N/A 02/06/2019   Procedure: ABDOMINAL AORTOGRAM W/LOWER EXTREMITY;  Surgeon: [name], MD;  Location: [place] [facility];  Service: Cardiovascular;  Laterality: N/A;  bilateral  . ABDOMINAL AORTOGRAM W/LOWER EXTREMITY N/A 04/30/2019   Procedure: ABDOMINAL AORTOGRAM W/LOWER EXTREMITY;  Surgeon: [name], MD;  Location: [place] [facility];  Service: Cardiovascular;  Laterality: N/A;  . AORTOGRAM  08/11/2019   Procedure: Aortogram;  Surgeon: [name], MD;  Location: [place];  Service: Vascular;;  . ESOPHAGEAL BANDING  08/22/2019   Procedure: ESOPHAGEAL BANDING;  Surgeon: Milus Banister, MD;  Location: [facility] ENDOSCOPY;  Service: Endoscopy;;  . ESOPHAGOGASTRODUODENOSCOPY N/A 08/22/2019   Procedure: ESOPHAGOGASTRODUODENOSCOPY (EGD);  Surgeon: Milus Banister, MD;  Location: [facility] ENDOSCOPY;  Service: Endoscopy;  Laterality: N/A;  . FEMORAL-POPLITEAL BYPASS GRAFT Left 08/11/2019   Procedure: BYPASS LEFT FEMORAL-DISTAL POPLITEAL ARTERY USING PROPATEN GRAFT;  Surgeon: [name], MD;  Location: [place];  Service: Vascular;  Laterality: Left;  . INSERTION OF DIALYSIS CATHETER Right 09/09/2019   Procedure: INSERTION OF DIALYSIS CATHETER;  Surgeon: Waynetta Sandy, MD;  Location: [place];  Service: Vascular;  Laterality: Right;  . MULTIPLE TOOTH EXTRACTIONS    . PERIPHERAL VASCULAR INTERVENTION  02/06/2019   Procedure: PERIPHERAL VASCULAR INTERVENTION;  Surgeon: [name], MD;  Location: [place] [facility];  Service: Cardiovascular;;  Bilateral Iliacs  . PERIPHERAL VASCULAR INTERVENTION  04/30/2019   Procedure: PERIPHERAL VASCULAR INTERVENTION;  Surgeon: [name], MD;  Location: [place] [facility];  Service: Cardiovascular;;  Stent - Lt. Iliac   . REMOVAL OF A DIALYSIS CATHETER  Right 09/09/2019   Procedure: Removal Of A Dialysis Catheter;  Surgeon: Waynetta Sandy, MD;  Location: [place];  Service: Vascular;  Laterality: Right;  . ULTRASOUND GUIDANCE FOR VASCULAR ACCESS Right 09/09/2019   Procedure: Ultrasound Guidance For Vascular Access;  Surgeon: Waynetta Sandy, MD;  Location: [place];  Service: Vascular;  Laterality: Right;  [facility] VASCULAR SURGERY          Home Medications    Prior to Admission medications   Medication Sig Start Date End Date Taking? Authorizing Provider  acetaminophen (TYLENOL) 500 MG tablet Take 1,000 mg by mouth daily as needed for moderate pain or headache.   Yes [provider]  atorvastatin (LIPITOR) 80 MG tablet Take 80 mg by mouth daily.  04/16/19  Yes [provider]  chlorthalidone (HYGROTON) 50 MG tablet Take 50 mg by mouth daily.   Yes [provider]  clopidogrel (PLAVIX) 75 MG tablet Take 75 mg by mouth daily.   Yes [provider]  diphenhydrAMINE (BENADRYL) 25 MG tablet Take 25 mg by mouth daily as needed for itching.   Yes [provider]  empagliflozin (JARDIANCE) 10 MG TABS tablet Take 10 mg by mouth daily.   Yes [provider]  gabapentin (NEURONTIN) 300 MG capsule Take 300 mg by mouth 3 (three) times daily.   Yes [provider]  glipiZIDE (GLUCOTROL) 5 MG tablet Take 5 mg by mouth 2 (two) times daily before a meal.    Yes [provider]  Insulin Glargine (BASAGLAR KWIKPEN) 100 UNIT/ML SOPN Inject 20 Units into the skin every evening.   Yes [provider]  labetalol (NORMODYNE) 300 MG tablet Take 300 mg by mouth 2 (two) times daily.   Yes [provider]  NIFEdipine (PROCARDIA XL/NIFEDICAL-XL) 90 MG 24 hr tablet Take 90 mg by mouth daily.  06/18/19  Yes [provider]  oxyCODONE-acetaminophen (PERCOCET/ROXICET) 5-325 MG tablet Take 1 tablet by mouth every 6 (six) hours as needed. Patient taking differently:  Take 1 tablet by mouth every 6 (six) hours as needed for moderate pain.  09/10/19  Yes Ulyses Amor, PA-C    Family History Family History  Problem Relation Age of Onset  . Alcohol abuse Father   . Cancer Mother   . Alcohol abuse Brother   . Bipolar disorder Son   . Bipolar disorder Daughter     Social History Social History   Tobacco Use  . Smoking status: Former Smoker    Packs/day: 1.00    Years: 36.00    Pack years: 36.00    Types: Cigarettes    Quit date: 01/25/2019    Years since quitting: 0.6  . Smokeless tobacco: Never Used  Substance Use Topics  . Alcohol use: Not Currently    Frequency: Never    Comment: per pt's wife, pt is an alcoholic but just recently stopped drinking in 04/2019  . Drug use: Never     Allergies   Other   Review of Systems Review of Systems  Unable to perform ROS: Mental status change     Physical Exam Updated Vital Signs BP (!) 185/71   Pulse (!) 107  Temp 98.4 F (36.9 C) (Oral)   Resp 15   Ht 6\' 1"  (1.854 m)   Wt 86 kg   SpO2 100%   BMI 25.01 kg/m   Physical Exam Vitals signs and nursing note reviewed.  Constitutional:      Appearance: He is well-developed.  HENT:     Head: Normocephalic and atraumatic.  Eyes:     Conjunctiva/sclera: Conjunctivae normal.  Neck:     Musculoskeletal: Neck supple.  Cardiovascular:     Rate and Rhythm: Normal rate and regular rhythm.     Heart sounds: No murmur.  Pulmonary:     Effort: Pulmonary effort is normal. No respiratory distress.     Breath sounds: Normal breath sounds.  Abdominal:     Palpations: Abdomen is soft.     Tenderness: There is no abdominal tenderness.  Skin:    General: Skin is warm and dry.     Capillary Refill: Capillary refill takes less than 2 seconds.  Neurological:     Mental Status: He is alert.     Comments: GCS 14  Alert, oriented to person, place but not time or situation  Cranial nerves II through XII intact, 5 out of 5 strength in  bilateral upper and lower extremities, sensation intact in all 4 extremities, normal finger-nose-finger      ED Treatments / Results  Labs (all labs ordered are listed, but only abnormal results are displayed) Labs Reviewed  COMPREHENSIVE METABOLIC PANEL - Abnormal; Notable for the following components:      Result Value   Sodium 133 (*)    Glucose, Bld 123 (*)    BUN 25 (*)    Creatinine, Ser 2.36 (*)    Calcium 8.3 (*)    Albumin 2.2 (*)    Total Bilirubin 1.9 (*)    GFR calc non Af Amer 30 (*)    GFR calc Af Amer 35 (*)    All other components within normal limits  CBC - Abnormal; Notable for the following components:   RBC 2.87 (*)    Hemoglobin 8.0 (*)    HCT 25.3 (*)    RDW 15.9 (*)    Platelets 63 (*)    All other components within normal limits  LACTIC ACID, PLASMA - Abnormal; Notable for the following components:   Lactic Acid, Venous 2.7 (*)    All other components within normal limits  AMMONIA - Abnormal; Notable for the following components:   Ammonia 53 (*)    All other components within normal limits  CBG MONITORING, ED - Abnormal; Notable for the following components:   Glucose-Capillary 123 (*)    All other components within normal limits  LACTIC ACID, PLASMA  ETHANOL    EKG EKG Interpretation  Date/Time:  Saturday September 20 2019 17:24:58 EST Ventricular Rate:  109 PR Interval:    QRS Duration: 80 QT Interval:  354 QTC Calculation: 477 R Axis:   70 Text Interpretation: Sinus tachycardia RSR' in V1 or V2, probably normal variant Borderline prolonged QT interval Confirmed by Madalyn Rob [phone]) on 09/20/2019 5:28:29 PM   Radiology Ct Head Wo Contrast  Result Date: 09/20/2019 CLINICAL DATA:  Encephalopathy confusion EXAM: CT HEAD WITHOUT CONTRAST TECHNIQUE: Contiguous axial images were obtained from the base of the skull through the vertex without intravenous contrast. COMPARISON:  CT brain 08/24/2019 FINDINGS: Brain: No acute territorial  infarction, hemorrhage, or intracranial mass. Mild atrophy. Mild hypodensity in the white matter consistent with small vessel ischemic change.  Stable ventricle size Vascular: No hyperdense vessels.  Carotid vascular calcification Skull: Normal. Negative for fracture or focal lesion. Sinuses/Orbits: Mucosal thickening in the sinuses Other: None. IMPRESSION: 1. No CT evidence for acute intracranial abnormality. 2. Atrophy and mild small vessel ischemic changes of the white matter Electronically Signed   By: Donavan Foil M.D.   On: 09/20/2019 19:16   Dg Chest Portable 1 View  Result Date: 09/20/2019 CLINICAL DATA:  AMS, pt missed renal dialysis today, pt restless and unable to follow commands, held back flat for image EXAM: PORTABLE CHEST 1 VIEW COMPARISON:  09/09/2019 FINDINGS: Cardiac silhouette borderline enlarged. No mediastinal or hilar masses. Clear lungs.  No pleural effusion or pneumothorax. Right internal jugular dual-lumen tunneled central venous catheter is stable. Skeletal structures are grossly intact. IMPRESSION: No acute cardiopulmonary disease. Electronically Signed   By: Lajean Manes M.D.   On: 09/20/2019 17:59    Procedures Procedures (including critical care time)  Medications Ordered in ED Medications - No data to display   Initial Impression / Assessment and Plan / ED Course  I have reviewed the triage vital signs and the nursing notes.  Pertinent labs & imaging results that were available during my care of the patient were reviewed by me and considered in my medical decision making (see chart for details).  Clinical Course as of Sep 19 2352  Sat Sep 20, 2019  1925 Ammonia(!): 53 [RD]  1925 CT Head Wo Contrast [RD]  1925 Will recheck patient    [RD]  1954 Discussed with wife Edward Rocha   [RD]  2052 Discussed with hospitalist, requests notify nephrology   [RD]  2129 Discussed with Hollie Salk- can do dialysis here at [facility] if needed    [RD]    Clinical Course User Index [RD]  Lucrezia Starch, MD       53 year old male hypertension, hyperlipidemia, diabetes, acute limb ischemia s/p b/l common iliac stent placement, renal failure on dialysis presented with confusion.  No focal deficits on exam.  No significant uremia.  Ammonia was significantly elevated.  Wife reported had not taken lactulose recently.  Suspect this may be etiology for symptoms today.  No fever, no leukocytosis, no other focal infectious symptoms, doubt infectious process.  Given change in mental status, will start lactulose and consult hospitalist for further treatment and observation.  Dr. Orlin Hilding accepting hospitalist.  Final Clinical Impressions(s) / ED Diagnoses   Final diagnoses:  Hyperammonemia ([place])  ESRD (end stage renal disease) [facility])  Encephalopathy    ED Discharge Orders    None       Lucrezia Starch, MD 09/20/19 2046    Lucrezia Starch, MD 09/20/19 [phone]

## 2019-09-20 NOTE — ED Notes (Signed)
Pt returned to bed after trying to scoot off of the end of the bed

## 2019-09-20 NOTE — ED Notes (Signed)
Covid test has been taken to lab

## 2019-09-20 NOTE — ED Notes (Signed)
Pt asleep.

## 2019-09-20 NOTE — ED Notes (Signed)
Pt awaiting bed assignment   ? dialysis

## 2019-09-20 NOTE — ED Notes (Signed)
Dr D has evaluated

## 2019-09-20 NOTE — ED Notes (Signed)
Critical call from Lab taken by Selena Batten, RN  She has informed this RN and Dr D

## 2019-09-20 NOTE — ED Notes (Signed)
Pt remains confused but appears brighter in affect than when he arrived

## 2019-09-21 ENCOUNTER — Other Ambulatory Visit: Payer: Self-pay

## 2019-09-21 ENCOUNTER — Encounter (HOSPITAL_COMMUNITY): Payer: Self-pay

## 2019-09-21 DIAGNOSIS — E872 Acidosis: Secondary | ICD-10-CM | POA: Diagnosis present

## 2019-09-21 DIAGNOSIS — K7200 Acute and subacute hepatic failure without coma: Secondary | ICD-10-CM | POA: Diagnosis present

## 2019-09-21 DIAGNOSIS — G9341 Metabolic encephalopathy: Secondary | ICD-10-CM | POA: Diagnosis present

## 2019-09-21 DIAGNOSIS — R4182 Altered mental status, unspecified: Secondary | ICD-10-CM | POA: Diagnosis present

## 2019-09-21 DIAGNOSIS — Z6827 Body mass index (BMI) 27.0-27.9, adult: Secondary | ICD-10-CM | POA: Diagnosis not present

## 2019-09-21 DIAGNOSIS — N186 End stage renal disease: Secondary | ICD-10-CM | POA: Diagnosis present

## 2019-09-21 DIAGNOSIS — L03213 Periorbital cellulitis: Secondary | ICD-10-CM | POA: Diagnosis not present

## 2019-09-21 DIAGNOSIS — E871 Hypo-osmolality and hyponatremia: Secondary | ICD-10-CM | POA: Diagnosis present

## 2019-09-21 DIAGNOSIS — E039 Hypothyroidism, unspecified: Secondary | ICD-10-CM | POA: Diagnosis present

## 2019-09-21 DIAGNOSIS — E785 Hyperlipidemia, unspecified: Secondary | ICD-10-CM | POA: Diagnosis present

## 2019-09-21 DIAGNOSIS — D696 Thrombocytopenia, unspecified: Secondary | ICD-10-CM | POA: Diagnosis present

## 2019-09-21 DIAGNOSIS — R410 Disorientation, unspecified: Secondary | ICD-10-CM | POA: Diagnosis not present

## 2019-09-21 DIAGNOSIS — Z811 Family history of alcohol abuse and dependence: Secondary | ICD-10-CM | POA: Diagnosis not present

## 2019-09-21 DIAGNOSIS — I120 Hypertensive chronic kidney disease with stage 5 chronic kidney disease or end stage renal disease: Secondary | ICD-10-CM | POA: Diagnosis present

## 2019-09-21 DIAGNOSIS — Z20828 Contact with and (suspected) exposure to other viral communicable diseases: Secondary | ICD-10-CM | POA: Diagnosis present

## 2019-09-21 DIAGNOSIS — K7030 Alcoholic cirrhosis of liver without ascites: Secondary | ICD-10-CM | POA: Diagnosis present

## 2019-09-21 DIAGNOSIS — Z809 Family history of malignant neoplasm, unspecified: Secondary | ICD-10-CM | POA: Diagnosis not present

## 2019-09-21 DIAGNOSIS — Z794 Long term (current) use of insulin: Secondary | ICD-10-CM | POA: Diagnosis not present

## 2019-09-21 DIAGNOSIS — E46 Unspecified protein-calorie malnutrition: Secondary | ICD-10-CM | POA: Diagnosis present

## 2019-09-21 DIAGNOSIS — E1151 Type 2 diabetes mellitus with diabetic peripheral angiopathy without gangrene: Secondary | ICD-10-CM | POA: Diagnosis present

## 2019-09-21 DIAGNOSIS — Z87891 Personal history of nicotine dependence: Secondary | ICD-10-CM | POA: Diagnosis not present

## 2019-09-21 DIAGNOSIS — D631 Anemia in chronic kidney disease: Secondary | ICD-10-CM | POA: Diagnosis present

## 2019-09-21 DIAGNOSIS — E876 Hypokalemia: Secondary | ICD-10-CM | POA: Diagnosis present

## 2019-09-21 DIAGNOSIS — E1122 Type 2 diabetes mellitus with diabetic chronic kidney disease: Secondary | ICD-10-CM | POA: Diagnosis present

## 2019-09-21 DIAGNOSIS — Z7902 Long term (current) use of antithrombotics/antiplatelets: Secondary | ICD-10-CM | POA: Diagnosis not present

## 2019-09-21 LAB — CBC
HCT: 23.4 % — ABNORMAL LOW (ref 39.0–52.0)
Hemoglobin: 7.4 g/dL — ABNORMAL LOW (ref 13.0–17.0)
MCH: 27.7 pg (ref 26.0–34.0)
MCHC: 31.6 g/dL (ref 30.0–36.0)
MCV: 87.6 fL (ref 80.0–100.0)
Platelets: 59 10*3/uL — ABNORMAL LOW (ref 150–400)
RBC: 2.67 MIL/uL — ABNORMAL LOW (ref 4.22–5.81)
RDW: 15.9 % — ABNORMAL HIGH (ref 11.5–15.5)
WBC: 4.7 10*3/uL (ref 4.0–10.5)
nRBC: 0 % (ref 0.0–0.2)

## 2019-09-21 LAB — GLUCOSE, CAPILLARY
Glucose-Capillary: 159 mg/dL — ABNORMAL HIGH (ref 70–99)
Glucose-Capillary: 137 mg/dL — ABNORMAL HIGH (ref 70–99)
Glucose-Capillary: 131 mg/dL — ABNORMAL HIGH (ref 70–99)

## 2019-09-21 LAB — COMPREHENSIVE METABOLIC PANEL
ALT: 19 U/L (ref 0–44)
AST: 34 U/L (ref 15–41)
Albumin: 1.9 g/dL — ABNORMAL LOW (ref 3.5–5.0)
Alkaline Phosphatase: 69 U/L (ref 38–126)
Anion gap: 8 (ref 5–15)
BUN: 24 mg/dL — ABNORMAL HIGH (ref 6–20)
CO2: 23 mmol/L (ref 22–32)
Calcium: 8.2 mg/dL — ABNORMAL LOW (ref 8.9–10.3)
Chloride: 104 mmol/L (ref 98–111)
Creatinine, Ser: 1.63 mg/dL — ABNORMAL HIGH (ref 0.61–1.24)
GFR calc Af Amer: 54 mL/min — ABNORMAL LOW (ref >60)
GFR calc non Af Amer: 47 mL/min — ABNORMAL LOW (ref >60)
Glucose, Bld: 116 mg/dL — ABNORMAL HIGH (ref 70–99)
Potassium: 3.3 mmol/L — ABNORMAL LOW (ref 3.5–5.1)
Sodium: 135 mmol/L (ref 135–145)
Total Bilirubin: 1.8 mg/dL — ABNORMAL HIGH (ref 0.3–1.2)
Total Protein: 6 g/dL — ABNORMAL LOW (ref 6.5–8.1)

## 2019-09-21 LAB — IRON AND TIBC
Iron: 84 ug/dL (ref 45–182)
Saturation Ratios: 33 % (ref 17.9–39.5)
TIBC: 253 ug/dL (ref 250–450)
UIBC: 169 ug/dL

## 2019-09-21 LAB — TSH: TSH: 1.691 u[IU]/mL (ref 0.350–4.500)

## 2019-09-21 LAB — RETICULOCYTES
Immature Retic Fract: 22.5 % — ABNORMAL HIGH (ref 2.3–15.9)
RBC.: 2.66 MIL/uL — ABNORMAL LOW (ref 4.22–5.81)
Retic Count, Absolute: 119.7 10*3/uL (ref 19.0–186.0)
Retic Ct Pct: 4.5 % — ABNORMAL HIGH (ref 0.4–3.1)

## 2019-09-21 LAB — VITAMIN B12: Vitamin B-12: 2017 pg/mL — ABNORMAL HIGH (ref 180–914)

## 2019-09-21 LAB — PHOSPHORUS: Phosphorus: 5 mg/dL — ABNORMAL HIGH (ref 2.5–4.6)

## 2019-09-21 LAB — MAGNESIUM: Magnesium: 1.5 mg/dL — ABNORMAL LOW (ref 1.7–2.4)

## 2019-09-21 LAB — SARS CORONAVIRUS 2 (TAT 6-24 HRS): SARS Coronavirus 2: NEGATIVE

## 2019-09-21 LAB — FERRITIN: Ferritin: 162 ng/mL (ref 24–336)

## 2019-09-21 LAB — FOLATE: Folate: 21.6 ng/mL (ref >5.9)

## 2019-09-21 MED ORDER — INSULIN DETEMIR 100 UNIT/ML ~~LOC~~ SOLN
10.0000 [IU] | Freq: Every day | SUBCUTANEOUS | Status: DC
  Administered 2019-09-21: 10 [IU] via SUBCUTANEOUS
  Filled 2019-09-21 (×2): qty 0.1

## 2019-09-21 MED ORDER — NIFEDIPINE ER OSMOTIC RELEASE 30 MG PO TB24
30.0000 mg | ORAL_TABLET | Freq: Every day | ORAL | Status: DC
  Administered 2019-09-22 – 2019-09-23 (×2): 30 mg via ORAL
  Filled 2019-09-21 (×2): qty 1

## 2019-09-21 MED ORDER — ATORVASTATIN CALCIUM 40 MG PO TABS
80.0000 mg | ORAL_TABLET | Freq: Every day | ORAL | Status: DC
  Administered 2019-09-21 – 2019-09-23 (×3): 80 mg via ORAL
  Filled 2019-09-21 (×3): qty 2

## 2019-09-21 MED ORDER — CHLORTHALIDONE 25 MG PO TABS
50.0000 mg | ORAL_TABLET | Freq: Every day | ORAL | Status: DC
  Administered 2019-09-21 – 2019-09-23 (×3): 50 mg via ORAL
  Filled 2019-09-21 (×3): qty 2

## 2019-09-21 MED ORDER — CLOPIDOGREL BISULFATE 75 MG PO TABS
75.0000 mg | ORAL_TABLET | Freq: Every day | ORAL | Status: DC
  Administered 2019-09-21 – 2019-09-23 (×3): 75 mg via ORAL
  Filled 2019-09-21 (×3): qty 1

## 2019-09-21 MED ORDER — INSULIN ASPART 100 UNIT/ML ~~LOC~~ SOLN
0.0000 [IU] | Freq: Three times a day (TID) | SUBCUTANEOUS | Status: DC
  Administered 2019-09-21: 3 [IU] via SUBCUTANEOUS
  Administered 2019-09-21 – 2019-09-22 (×3): 2 [IU] via SUBCUTANEOUS
  Administered 2019-09-22: 3 [IU] via SUBCUTANEOUS

## 2019-09-21 MED ORDER — NIFEDIPINE ER OSMOTIC RELEASE 30 MG PO TB24
90.0000 mg | ORAL_TABLET | Freq: Every day | ORAL | Status: DC
  Administered 2019-09-21: 09:00:00 90 mg via ORAL
  Filled 2019-09-21: qty 3

## 2019-09-21 MED ORDER — HEPARIN SODIUM (PORCINE) 5000 UNIT/ML IJ SOLN
5000.0000 [IU] | Freq: Three times a day (TID) | INTRAMUSCULAR | Status: DC
  Administered 2019-09-21: 07:00:00 5000 [IU] via SUBCUTANEOUS
  Filled 2019-09-21: qty 1

## 2019-09-21 MED ORDER — POTASSIUM CHLORIDE CRYS ER 20 MEQ PO TBCR
40.0000 meq | EXTENDED_RELEASE_TABLET | Freq: Once | ORAL | Status: AC
  Administered 2019-09-21: 40 meq via ORAL
  Filled 2019-09-21: qty 2

## 2019-09-21 MED ORDER — CHLORHEXIDINE GLUCONATE CLOTH 2 % EX PADS
6.0000 | MEDICATED_PAD | Freq: Every day | CUTANEOUS | Status: DC
  Administered 2019-09-21 – 2019-09-23 (×3): 6 via TOPICAL

## 2019-09-21 MED ORDER — DARBEPOETIN ALFA 100 MCG/0.5ML IJ SOSY
100.0000 ug | PREFILLED_SYRINGE | INTRAMUSCULAR | Status: DC
  Administered 2019-09-22: 100 ug via SUBCUTANEOUS
  Filled 2019-09-21: qty 0.5

## 2019-09-21 MED ORDER — POLYETHYLENE GLYCOL 3350 17 G PO PACK: 17.0000 g | PACK | Freq: Every day | ORAL | Status: DC | PRN

## 2019-09-21 MED ORDER — LABETALOL HCL 200 MG PO TABS
300.0000 mg | ORAL_TABLET | Freq: Two times a day (BID) | ORAL | Status: DC
  Administered 2019-09-21 – 2019-09-23 (×5): 300 mg via ORAL
  Filled 2019-09-21 (×5): qty 2

## 2019-09-21 MED ORDER — ACETAMINOPHEN 650 MG RE SUPP: 650.0000 mg | Freq: Four times a day (QID) | RECTAL | Status: DC | PRN

## 2019-09-21 MED ORDER — GABAPENTIN 300 MG PO CAPS
300.0000 mg | ORAL_CAPSULE | Freq: Two times a day (BID) | ORAL | Status: DC
  Administered 2019-09-21 – 2019-09-23 (×4): 300 mg via ORAL
  Filled 2019-09-21 (×4): qty 1

## 2019-09-21 MED ORDER — MAGNESIUM SULFATE IN D5W 1-5 GM/100ML-% IV SOLN
1.0000 g | Freq: Once | INTRAVENOUS | Status: AC
  Administered 2019-09-21: 1 g via INTRAVENOUS
  Filled 2019-09-21: qty 100

## 2019-09-21 MED ORDER — GABAPENTIN 300 MG PO CAPS
300.0000 mg | ORAL_CAPSULE | Freq: Three times a day (TID) | ORAL | Status: DC
  Administered 2019-09-21: 300 mg via ORAL
  Filled 2019-09-21: qty 1

## 2019-09-21 MED ORDER — ACETAMINOPHEN 325 MG PO TABS: 650.0000 mg | ORAL_TABLET | Freq: Four times a day (QID) | ORAL | Status: DC | PRN

## 2019-09-21 MED ORDER — OXYCODONE-ACETAMINOPHEN 5-325 MG PO TABS
1.0000 | ORAL_TABLET | Freq: Four times a day (QID) | ORAL | Status: DC | PRN
  Filled 2019-09-21: qty 1

## 2019-09-21 MED ORDER — FUROSEMIDE 40 MG PO TABS
40.0000 mg | ORAL_TABLET | Freq: Two times a day (BID) | ORAL | Status: DC
  Administered 2019-09-21 – 2019-09-23 (×4): 40 mg via ORAL
  Filled 2019-09-21 (×4): qty 1

## 2019-09-21 NOTE — Progress Notes (Signed)
Edward Rocha tolerated his magnesium sulfate infusion and is currently resting. I will continue to monitor patient

## 2019-09-21 NOTE — Consult Note (Signed)
Reason for Consult: To manage dialysis and dialysis related needs Referring Physician: Rosalita Levan. is an 53 y.o. male with past medical history significant for diabetes mellitus, hypertension, hyperlipidemia and PAD.  He is status post an aortogram/iliac stenting/femoral pop bypass surgery on [id number] that was complicated by acute kidney injury (his baseline crt appears normal).  He required CRRT from 10/18-10/21 then was transitioned to intermittent hemodialysis.  There was some question as if he might recover renal function -but his last dialysis treatment at [facility] was on 11/9- he then was discharged on 11/12-to begin dialysis at the [facility] kidney center on a TTS schedule.   He was brought to the emergency department at [facility] overnight for altered mental status-reported to staff that he had missed dialysis yesterday.   Head CT was negative-only abnormality on labs was elevated ammonia-started on lactulose interestingly-his creatinine was 2.36 yesterday afternoon and is noted to be 1.63 today.  I interviewed him in his room.  He feels like his confusion is better and that the lactulose may have helped.  He tells me the last time he was at dialysis was actually Tuesday.   He does have edema in his feet.  He feels like he makes a normal amount of urine- none recorded    Dialyzes at [facility] - they are not open today so I was not able to get his regular orders-he has a [facility]  Past Medical History:  Diagnosis Date  . Acute renal failure (ARF) ([place])    12/2018 when admitted for hyperosmolar hyperglycemic nonketotic syndrome  . Bilateral pain of leg and foot   . CHF (congestive heart failure) ([place])   . Coronary artery disease   . Diabetes mellitus without complication ([place])    Type II  . Dizziness   . Elevated LFTs 05/2019  . Frequent urination   . GERD (gastroesophageal reflux disease)   . Hyperglycemia    Non-Ketotic Hyperosmolar.  . Hyperlipidemia   . Hypernatremia   .  Hypertension   . Peripheral vascular disease ([place])   . Syncope    12/2018, diagnosed with hyperosmolar hyperglycemic nonketotic syndrome, AKI, hyponatremia ([facility])  . Thrombocytopenia ([place])   . Thyroid disease    Hypothyroidism  . Weakness     Past Surgical History:  Procedure Laterality Date  . ABDOMINAL AORTOGRAM W/LOWER EXTREMITY N/A 02/06/2019   Procedure: ABDOMINAL AORTOGRAM W/LOWER EXTREMITY;  Surgeon: [name], MD;  Location: [place] CV LAB;  Service: Cardiovascular;  Laterality: N/A;  bilateral  . ABDOMINAL AORTOGRAM W/LOWER EXTREMITY N/A 04/30/2019   Procedure: ABDOMINAL AORTOGRAM W/LOWER EXTREMITY;  Surgeon: [name], MD;  Location: [place] CV LAB;  Service: Cardiovascular;  Laterality: N/A;  . AORTOGRAM  08/11/2019   Procedure: Aortogram;  Surgeon: [name], MD;  Location: [place];  Service: Vascular;;  . ESOPHAGEAL BANDING  08/22/2019   Procedure: ESOPHAGEAL BANDING;  Surgeon: Milus Banister, MD;  Location: [facility] ENDOSCOPY;  Service: Endoscopy;;  . ESOPHAGOGASTRODUODENOSCOPY N/A 08/22/2019   Procedure: ESOPHAGOGASTRODUODENOSCOPY (EGD);  Surgeon: Milus Banister, MD;  Location: [facility] ENDOSCOPY;  Service: Endoscopy;  Laterality: N/A;  . FEMORAL-POPLITEAL BYPASS GRAFT Left 08/11/2019   Procedure: BYPASS LEFT FEMORAL-DISTAL POPLITEAL ARTERY USING PROPATEN GRAFT;  Surgeon: [name], MD;  Location: [place];  Service: Vascular;  Laterality: Left;  . INSERTION OF DIALYSIS CATHETER Right 09/09/2019   Procedure: INSERTION OF DIALYSIS CATHETER;  Surgeon: Waynetta Sandy, MD;  Location: [place];  Service: Vascular;  Laterality: Right;  . MULTIPLE TOOTH EXTRACTIONS    . PERIPHERAL VASCULAR INTERVENTION  02/06/2019   Procedure: PERIPHERAL VASCULAR INTERVENTION;  Surgeon: [name], MD;  Location: [place] CV LAB;  Service: Cardiovascular;;  Bilateral Iliacs  . PERIPHERAL VASCULAR INTERVENTION  04/30/2019   Procedure:  PERIPHERAL VASCULAR INTERVENTION;  Surgeon: [name], MD;  Location: [place] CV LAB;  Service: Cardiovascular;;  Stent - Lt. Iliac   . REMOVAL OF A DIALYSIS CATHETER Right 09/09/2019   Procedure: Removal Of A Dialysis Catheter;  Surgeon: Waynetta Sandy, MD;  Location: [place];  Service: Vascular;  Laterality: Right;  . ULTRASOUND GUIDANCE FOR VASCULAR ACCESS Right 09/09/2019   Procedure: Ultrasound Guidance For Vascular Access;  Surgeon: Waynetta Sandy, MD;  Location: [place];  Service: Vascular;  Laterality: Right;  Marland Kitchen VASCULAR SURGERY      Family History  Problem Relation Age of Onset  . Alcohol abuse Father   . Cancer Mother   . Alcohol abuse Brother   . Bipolar disorder Son   . Bipolar disorder Daughter     Social History:  reports that he quit smoking about 7 months ago. His smoking use included cigarettes. He has a 36.00 pack-year smoking history. He has never used smokeless tobacco. He reports previous alcohol use. He reports that he does not use drugs.  Allergies:  Allergies  Allergen Reactions  . Other     Pt received platelets and had a bad reaction from infusion     Medications: I have reviewed the patient's current medications.   Results for orders placed or performed during the hospital encounter of 09/20/19 (from the past 48 hour(s))  CBG monitoring, ED     Status: Abnormal   Collection Time: 09/20/19  5:26 PM  Result Value Ref Range   Glucose-Capillary 123 (H) 70 - 99 mg/dL  Comprehensive metabolic panel     Status: Abnormal   Collection Time: 09/20/19  5:44 PM  Result Value Ref Range   Sodium 133 (L) 135 - 145 mmol/L   Potassium 3.8 3.5 - 5.1 mmol/L   Chloride 100 98 - 111 mmol/L   CO2 24 22 - 32 mmol/L   Glucose, Bld 123 (H) 70 - 99 mg/dL   BUN 25 (H) 6 - 20 mg/dL   Creatinine, Ser 2.36 (H) 0.61 - 1.24 mg/dL   Calcium 8.3 (L) 8.9 - 10.3 mg/dL   Total Protein 6.9 6.5 - 8.1 g/dL   Albumin 2.2 (L) 3.5 - 5.0 g/dL   AST 41 15  - 41 U/L   ALT 22 0 - 44 U/L   Alkaline Phosphatase 78 38 - 126 U/L   Total Bilirubin 1.9 (H) 0.3 - 1.2 mg/dL   GFR calc non Af Amer 30 (L) >60 mL/min   GFR calc Af Amer 35 (L) >60 mL/min   Anion gap 9 5 - 15    Comment: Performed at [facility], [address], [place], [place] [address]  CBC     Status: Abnormal   Collection Time: 09/20/19  5:44 PM  Result Value Ref Range   WBC 4.7 4.0 - 10.5 K/uL   RBC 2.87 (L) 4.22 - 5.81 MIL/uL   Hemoglobin 8.0 (L) 13.0 - 17.0 g/dL   HCT 25.3 (L) 39.0 - 52.0 %   MCV 88.2 80.0 - 100.0 fL   MCH 27.9 26.0 - 34.0 pg   MCHC 31.6 30.0 - 36.0 g/dL   RDW 15.9 (H)  11.5 - 15.5 %   Platelets 63 (L) 150 - 400 K/uL    Comment: SPECIMEN CHECKED FOR CLOTS   nRBC 0.0 0.0 - 0.2 %    Comment: Performed at [facility], [address], [place], [place] [address]  Lactic acid, plasma     Status: Abnormal   Collection Time: 09/20/19  5:44 PM  Result Value Ref Range   Lactic Acid, Venous 2.7 (HH) 0.5 - 1.9 mmol/L    Comment: CRITICAL RESULT CALLED TO, READ BACK BY AND VERIFIED WITH: MINTER,B. AT 1825 ON 09/20/2019 BY EVA Performed at [facility], [address] [place], [place] [address]   Ethanol     Status: None   Collection Time: 09/20/19  5:44 PM  Result Value Ref Range   Alcohol, Ethyl (B) <10 <10 mg/dL    Comment: (NOTE) Lowest detectable limit for serum alcohol is 10 mg/dL. For medical purposes only. Performed at [facility], [address], [place], [place] [address]   Ammonia     Status: Abnormal   Collection Time: 09/20/19  6:38 PM  Result Value Ref Range   Ammonia 53 (H) 9 - 35 umol/L    Comment: Performed at [facility], [address], [place], [place] [address]  Lactic acid, plasma     Status: None   Collection Time: 09/20/19  6:39 PM  Result Value Ref Range   Lactic Acid, Venous 1.8 0.5 - 1.9 mmol/L    Comment: Performed at [facility], [address], [place], [place] [address]  Magnesium     Status: Abnormal   Collection Time:  09/21/19  7:02 AM  Result Value Ref Range   Magnesium 1.5 (L) 1.7 - 2.4 mg/dL    Comment: Performed at [facility], [address], [place], [place] [address]  Phosphorus     Status: Abnormal   Collection Time: 09/21/19  7:02 AM  Result Value Ref Range   Phosphorus 5.0 (H) 2.5 - 4.6 mg/dL    Comment: Performed at [facility], [address], [place], [place] [address]  TSH     Status: None   Collection Time: 09/21/19  7:02 AM  Result Value Ref Range   TSH 1.691 0.350 - 4.500 uIU/mL    Comment: Performed by a 3rd Generation assay with a functional sensitivity of <=0.01 uIU/mL. Performed at [facility], [address], [place], [place] [address]   Comprehensive metabolic panel     Status: Abnormal   Collection Time: 09/21/19  7:02 AM  Result Value Ref Range   Sodium 135 135 - 145 mmol/L   Potassium 3.3 (L) 3.5 - 5.1 mmol/L   Chloride 104 98 - 111 mmol/L   CO2 23 22 - 32 mmol/L   Glucose, Bld 116 (H) 70 - 99 mg/dL   BUN 24 (H) 6 - 20 mg/dL   Creatinine, Ser 1.63 (H) 0.61 - 1.24 mg/dL   Calcium 8.2 (L) 8.9 - 10.3 mg/dL   Total Protein 6.0 (L) 6.5 - 8.1 g/dL   Albumin 1.9 (L) 3.5 - 5.0 g/dL   AST 34 15 - 41 U/L   ALT 19 0 - 44 U/L   Alkaline Phosphatase 69 38 - 126 U/L   Total Bilirubin 1.8 (H) 0.3 - 1.2 mg/dL   GFR calc non Af Amer 47 (L) >60 mL/min   GFR calc Af Amer 54 (L) >60 mL/min   Anion gap 8 5 - 15    Comment: Performed at [facility], [place]  [address] [place], [place] [address]  CBC     Status: Abnormal   Collection Time: 09/21/19  7:02 AM  Result Value Ref Range   WBC 4.7 4.0 - 10.5 K/uL   RBC 2.67 (L) 4.22 - 5.81 MIL/uL   Hemoglobin 7.4 (L) 13.0 - 17.0 g/dL   HCT 23.4 (L) 39.0 - 52.0 %   MCV 87.6 80.0 - 100.0 fL   MCH 27.7 26.0 - 34.0 pg   MCHC 31.6 30.0 - 36.0 g/dL   RDW 15.9 (H) 11.5 - 15.5 %   Platelets 59 (L) 150 - 400 K/uL    Comment: REPEATED TO VERIFY PLATELET COUNT CONFIRMED BY SMEAR SPECIMEN CHECKED FOR CLOTS Immature Platelet Fraction may  be clinically indicated, consider ordering this additional test [id number]    nRBC 0.0 0.0 - 0.2 %    Comment: Performed at [facility], [address], [place], [place] [address]  Folate     Status: None   Collection Time: 09/21/19  7:02 AM  Result Value Ref Range   Folate 21.6 >5.9 ng/mL    Comment: Performed at [facility], [address] [place] [address], [place], [place] [address]  Reticulocytes     Status: Abnormal   Collection Time: 09/21/19  7:02 AM  Result Value Ref Range   Retic Ct Pct 4.5 (H) 0.4 - 3.1 %   RBC. 2.66 (L) 4.22 - 5.81 MIL/uL   Retic Count, Absolute 119.7 19.0 - 186.0 K/uL   Immature Retic Fract 22.5 (H) 2.3 - 15.9 %    Comment: Performed at [facility], [address] [place], [place] [address]  Glucose, capillary     Status: Abnormal   Collection Time: 09/21/19 12:14 PM  Result Value Ref Range   Glucose-Capillary 159 (H) 70 - 99 mg/dL   Comment 1 Notify RN    Comment 2 Document in Chart     Ct Head Wo Contrast  Result Date: 09/20/2019 CLINICAL DATA:  Encephalopathy confusion EXAM: CT HEAD WITHOUT CONTRAST TECHNIQUE: Contiguous axial images were obtained from the base of the skull through the vertex without intravenous contrast. COMPARISON:  CT brain 08/24/2019 FINDINGS: Brain: No acute territorial infarction, hemorrhage, or intracranial mass. Mild atrophy. Mild hypodensity in the white matter consistent with small vessel ischemic change. Stable ventricle size Vascular: No hyperdense vessels.  Carotid vascular calcification Skull: Normal. Negative for fracture or focal lesion. Sinuses/Orbits: Mucosal thickening in the sinuses Other: None. IMPRESSION: 1. No CT evidence for acute intracranial abnormality. 2. Atrophy and mild small vessel ischemic changes of the white matter Electronically Signed   By: Donavan Foil M.D.   On: 09/20/2019 19:16   Dg Chest Portable 1 View  Result Date: 09/20/2019 CLINICAL DATA:  AMS, pt missed renal dialysis today, pt restless and unable to  follow commands, held back flat for image EXAM: PORTABLE CHEST 1 VIEW COMPARISON:  09/09/2019 FINDINGS: Cardiac silhouette borderline enlarged. No mediastinal or hilar masses. Clear lungs.  No pleural effusion or pneumothorax. Right internal jugular dual-lumen tunneled central venous catheter is stable. Skeletal structures are grossly intact. IMPRESSION: No acute cardiopulmonary disease. Electronically Signed   By: Lajean Manes M.D.   On: 09/20/2019 17:59    ROS: Positive for lower extremity edema, he feels his confusion is improved.  Still having some pain in the leg that was revascularized - otherwise review of systems is negative Blood pressure (!) 105/46, pulse 100, temperature 98.7 F (37.1 C), temperature source Oral, resp. rate 18, height 6\' 1"  (1.854 m), weight 88.4  kg, SpO2 98 %. General appearance: alert and no distress Resp: clear to auscultation bilaterally Cardio: regular rate and rhythm, S1, S2 normal, no murmur, click, rub or gallop GI: soft, non-tender; bowel sounds normal; no masses,  no organomegaly Extremities: edema 2 + bilat tunneled HD cath in place   Assessment/Plan: 53 year old black male with recent dialysis requiring acute kidney injury in the setting of vascular procedure 1 Renal- recent dialysis dependent AKI in the setting of vascular procedure last month at [facility] hospital.  Had intermittent dialysis at [facility] prior to discharge-last dialysis as an outpatient was on November 17.  He had normal renal function prior to this AKI episode.  I hope that his labs today indicate that he is finally recovering renal function.  There are no indications for dialysis-we will continue to watch and order daily labs 2 HTN/vol:  Does have lower extremity edema.  Blood pressure is soft on Procardia 90 daily and labetalol 300 twice daily.  Not sure how compliant he is as an outpatient with those medications.  Since he is tachycardic we will continue the labetalol but decrease the Procardia.  I  am going to add on some Lasix  3. Anemia of ESRD: He does have fairly significant anemia.  Iron stores are actually reasonable with iron sat of 33.  I will give him a dose of ESA.  Some of this could just be residual from hospitalization as well 4. Metabolic Bone Disease: We will order phos in the a.m. 5.  Hypokalemia- received oral repletion   Louis Meckel 09/21/2019, 1:16 PM

## 2019-09-21 NOTE — Plan of Care (Signed)
  Problem: Education: Goal: Knowledge of General Education information will improve Description Including pain rating scale, medication(s)/side effects and non-pharmacologic comfort measures Outcome: Progressing   Problem: Clinical Measurements: Goal: Ability to maintain clinical measurements within normal limits will improve Outcome: Progressing Goal: Diagnostic test results will improve Outcome: Progressing   Problem: Pain Managment: Goal: General experience of comfort will improve Outcome: Progressing   Problem: Safety: Goal: Ability to remain free from injury will improve Outcome: Progressing   

## 2019-09-21 NOTE — H&P (Addendum)
TRH H&P    Patient Demographics:    Edward Rocha, is a 53 y.o. male  MRN: [id number]  DOB - [birth year]  Admit Date - 09/20/2019  Referring MD/NP/PA: Dr. Roslynn Amble   Outpatient Primary MD for the patient is Edward Burly, MD  Patient coming from: Home  Chief complaint- "I don't know why I am here."   HPI:    Edward Rocha  is a 53 y.o. male, with history of ESRD on dialysis Tuesday Thursday Saturday, thyroid disease, hypertension, hyperlipidemia, diabetes mellitus type 2, and elevated LFTs presents to the ED today via EMS.  Patient is angry he does not know why he is here today.  He reports that his wife said that he had speech problems and that that is why he is here, but that he did not have speech problems.  It is reported that patient's wife reported altered mental status.  Asked patient if he had been confused, and patient said no.  He reports that he remembers EMS and remembers his wife bring him in here.  ER staff reports that he was recently reoriented and did not remember any of that.  ER staff also reports that upon arrival patient had incomprehensible speech.  Patient is a very poor historian, and cannot say when any of the started.  Of note he did miss dialysis today.  He reports that he just forgot dialysis reports he has only been getting dialysis for about a week and a half.  Dialysis access is in the right chest tunneled cath.  Patient reports that he sometimes becomes fatigued minutes getting close to time to have dialysis, but he is not fatigued now.  Patient reports he still makes urine.  His last void was this morning.  Sometimes he has difficulty as he has the urge to urinate but cannot make urine.  Patient is also supposed to be on lactulose.  Patient reports no known history of liver disease.  He does have a documented history of elevated LFTs.  Patient did have a hepatitis panel on August 24, 2019 that  was nonreactive for hep B surface antigen core total antibody B E antibody.  Patient had a negative HIV screen in July 2020.  I do not see hep C study done.  Wife reported to ED staff that she had not been giving him his lactulose and he will probably need an ammonia check.  In the ED Patient was mildly hyponatremic 133.  Patient had mild hyperglycemia 123, last hemoglobin A1c was 8.5.  Patient had an elevated creatinine to 2.36 but the creatinine before that was 5.09.  Patient has an albumin of 2.2.  AST and ALT are within normal limits.  Ammonia was 53.  Total bili is 1.9.  Patient initially had a lactic acidosis of 2.7 this trended down to 1.8.  Hematology revealed an anemia at 8.0.  This is stable as most recent hemoglobins are in the eights.  Patient had a CT of his head that showed no CT evidence of acute intracranial abnormality.  Atrophy  and mild ischemic changes of the white matter.  Patient had a chest x-Dosanjh that showed no acute cardiopulmonary disease.     Review of systems:  Loose review of systems is likely inaccurate due to patient's confusion, his review of systems as listed below: No Fever-chills, No Headache, No changes with Vision or hearing, No problems swallowing food or Liquids, No Chest pain, Cough or Shortness of Breath, No Abdominal pain, No Nausea or Vomiting, bowel movements are regular, No Blood in stool or Urine, No dysuria, No new skin rashes or bruises, No new joints pains-aches,  No new weakness, tingling, numbness in any extremity, No recent weight gain or loss, No polyuria, polydypsia or polyphagia, No significant Mental Stressors.  All other systems reviewed and are negative.    Past History of the following :    Past Medical History:  Diagnosis Date   Acute renal failure (ARF) ([place])    12/2018 when admitted for hyperosmolar hyperglycemic nonketotic syndrome   Bilateral pain of leg and foot    Diabetes mellitus without complication (HCC)    Type  II   Dizziness    Elevated LFTs 05/2019   Frequent urination    Hyperglycemia    Non-Ketotic Hyperosmolar.   Hyperlipidemia    Hypernatremia    Hypertension    Peripheral vascular disease ([place])    Syncope    12/2018, diagnosed with hyperosmolar hyperglycemic nonketotic syndrome, AKI, hyponatremia ([facility])   Thrombocytopenia (HCC)    Thyroid disease    Hypothyroidism   Weakness       Past Surgical History:  Procedure Laterality Date   ABDOMINAL AORTOGRAM W/LOWER EXTREMITY N/A 02/06/2019   Procedure: ABDOMINAL AORTOGRAM W/LOWER EXTREMITY;  Surgeon: [name], MD;  Location: [place] [facility];  Service: Cardiovascular;  Laterality: N/A;  bilateral   ABDOMINAL AORTOGRAM W/LOWER EXTREMITY N/A 04/30/2019   Procedure: ABDOMINAL AORTOGRAM W/LOWER EXTREMITY;  Surgeon: [name], MD;  Location: [place] [facility];  Service: Cardiovascular;  Laterality: N/A;   AORTOGRAM  08/11/2019   Procedure: Aortogram;  Surgeon: [name], MD;  Location: [place];  Service: Vascular;;   ESOPHAGEAL BANDING  08/22/2019   Procedure: ESOPHAGEAL BANDING;  Surgeon: Milus Banister, MD;  Location[facility];  Service: Endoscopy;;   ESOPHAGOGASTRODUODENOSCOPY N/A 08/22/2019   Procedure: ESOPHAGOGASTRODUODENOSCOPY (EGD);  Surgeon: Milus Banister, MD;  Location: [facility] ENDOSCOPY;  Service: Endoscopy;  Laterality: N/A;   FEMORAL-POPLITEAL BYPASS GRAFT Left 08/11/2019   Procedure: BYPASS LEFT FEMORAL-DISTAL POPLITEAL ARTERY USING PROPATEN GRAFT;  Surgeon: [name], MD;  Location: [place];  Service: Vascular;  Laterality: Left;   INSERTION OF DIALYSIS CATHETER Right 09/09/2019   Procedure: INSERTION OF DIALYSIS CATHETER;  Surgeon: Waynetta Sandy, MD;  Location: [place];  Service: Vascular;  Laterality: Right;   MULTIPLE TOOTH EXTRACTIONS     PERIPHERAL VASCULAR INTERVENTION  02/06/2019   Procedure: PERIPHERAL VASCULAR INTERVENTION;  Surgeon:  [name], MD;  Location: [place] [facility];  Service: Cardiovascular;;  Bilateral Iliacs   PERIPHERAL VASCULAR INTERVENTION  04/30/2019   Procedure: PERIPHERAL VASCULAR INTERVENTION;  Surgeon: [name], MD;  Location: [place] [facility];  Service: Cardiovascular;;  Stent - Lt. Iliac    REMOVAL OF A DIALYSIS CATHETER Right 09/09/2019   Procedure: Removal Of A Dialysis Catheter;  Surgeon: Waynetta Sandy, MD;  Location: [place];  Service: Vascular;  Laterality: Right;   ULTRASOUND GUIDANCE FOR VASCULAR ACCESS Right 09/09/2019   Procedure: Ultrasound Guidance  For Vascular Access;  Surgeon: Waynetta Sandy, MD;  Location: [facility] OR;  Service: Vascular;  Laterality: Right;   VASCULAR SURGERY        Social History:      Social History   Tobacco Use   Smoking status: Former Smoker    Packs/day: 1.00    Years: 36.00    Pack years: 36.00    Types: Cigarettes    Quit date: 01/25/2019    Years since quitting: 0.6   Smokeless tobacco: Never Used  Substance Use Topics   Alcohol use: Not Currently    Frequency: Never    Comment: per pt's wife, pt is an alcoholic but just recently stopped drinking in 04/2019       Family History :     Family History  Problem Relation Age of Onset   Alcohol abuse Father    Cancer Mother    Alcohol abuse Brother    Bipolar disorder Son    Bipolar disorder Daughter   Family history reviewed   Home Medications:   Prior to Admission medications   Medication Sig Start Date End Date Taking? Authorizing Provider  acetaminophen (TYLENOL) 500 MG tablet Take 1,000 mg by mouth daily as needed for moderate pain or headache.   Yes [provider]  atorvastatin (LIPITOR) 80 MG tablet Take 80 mg by mouth daily.  04/16/19  Yes [provider]  chlorthalidone (HYGROTON) 50 MG tablet Take 50 mg by mouth daily.   Yes [provider]  clopidogrel (PLAVIX) 75 MG tablet Take 75 mg by mouth daily.    Yes [provider]  diphenhydrAMINE (BENADRYL) 25 MG tablet Take 25 mg by mouth daily as needed for itching.   Yes [provider]  empagliflozin (JARDIANCE) 10 MG TABS tablet Take 10 mg by mouth daily.   Yes [provider]  gabapentin (NEURONTIN) 300 MG capsule Take 300 mg by mouth 3 (three) times daily.   Yes [provider]  glipiZIDE (GLUCOTROL) 5 MG tablet Take 5 mg by mouth 2 (two) times daily before a meal.    Yes [provider]  Insulin Glargine (BASAGLAR KWIKPEN) 100 UNIT/ML SOPN Inject 20 Units into the skin every evening.   Yes [provider]  labetalol (NORMODYNE) 300 MG tablet Take 300 mg by mouth 2 (two) times daily.   Yes [provider]  NIFEdipine (PROCARDIA XL/NIFEDICAL-XL) 90 MG 24 hr tablet Take 90 mg by mouth daily.  06/18/19  Yes [provider]  oxyCODONE-acetaminophen (PERCOCET/ROXICET) 5-325 MG tablet Take 1 tablet by mouth every 6 (six) hours as needed. Patient taking differently: Take 1 tablet by mouth every 6 (six) hours as needed for moderate pain.  09/10/19  Yes Ulyses Amor, PA-C     Allergies:     Allergies  Allergen Reactions   Other     Pt received platelets and had a bad reaction from infusion      Physical Exam:   Vitals  Blood pressure 140/79, pulse (!) 102, temperature 98.4 F (36.9 C), temperature source Oral, resp. rate 17, height 6\' 1"  (1.854 m), weight 86 kg, SpO2 99 %.  1.  General: Patient lying right lateral decubitus in no acute distress  2. Psychiatric: Mood is agitated and defensive  3. Neurologic: Cranial nerves II through XII are grossly intact with no focal deficits on limited exam  4. HEENMT:  Head is atraumatic normocephalic pupils are reactive to light trachea is midline  5. Respiratory :  Lungs are clear to auscultation bilaterally  6. Cardiovascular : H RRR  7. Gastrointestinal:  Abdomen is soft, nondistended, nontender to  palpation  8. Skin:  Skin reveals no acute lesions  9.Musculoskeletal:  2+ pitting edema in the bilateral lower extremities    Data Review:    CBC Recent Labs  Lab 09/20/19 1744  WBC 4.7  HGB 8.0*  HCT 25.3*  PLT 63*  MCV 88.2  MCH 27.9  MCHC 31.6  RDW 15.9*   ------------------------------------------------------------------------------------------------------------------  Results for orders placed or performed during the hospital encounter of 09/20/19 (from the past 48 hour(s))  CBG monitoring, ED     Status: Abnormal   Collection Time: 09/20/19  5:26 PM  Result Value Ref Range   Glucose-Capillary 123 (H) 70 - 99 mg/dL  Comprehensive metabolic panel     Status: Abnormal   Collection Time: 09/20/19  5:44 PM  Result Value Ref Range   Sodium 133 (L) 135 - 145 mmol/L   Potassium 3.8 3.5 - 5.1 mmol/L   Chloride 100 98 - 111 mmol/L   CO2 24 22 - 32 mmol/L   Glucose, Bld 123 (H) 70 - 99 mg/dL   BUN 25 (H) 6 - 20 mg/dL   Creatinine, Ser 2.36 (H) 0.61 - 1.24 mg/dL   Calcium 8.3 (L) 8.9 - 10.3 mg/dL   Total Protein 6.9 6.5 - 8.1 g/dL   Albumin 2.2 (L) 3.5 - 5.0 g/dL   AST 41 15 - 41 U/L   ALT 22 0 - 44 U/L   Alkaline Phosphatase 78 38 - 126 U/L   Total Bilirubin 1.9 (H) 0.3 - 1.2 mg/dL   GFR calc non Af Amer 30 (L) >60 mL/min   GFR calc Af Amer 35 (L) >60 mL/min   Anion gap 9 5 - 15    Comment: Performed at [facility], [address], [place], [place] [address]  CBC     Status: Abnormal   Collection Time: 09/20/19  5:44 PM  Result Value Ref Range   WBC 4.7 4.0 - 10.5 K/uL   RBC 2.87 (L) 4.22 - 5.81 MIL/uL   Hemoglobin 8.0 (L) 13.0 - 17.0 g/dL   HCT 25.3 (L) 39.0 - 52.0 %   MCV 88.2 80.0 - 100.0 fL   MCH 27.9 26.0 - 34.0 pg   MCHC 31.6 30.0 - 36.0 g/dL   RDW 15.9 (H) 11.5 - 15.5 %   Platelets 63 (L) 150 - 400 K/uL    Comment: SPECIMEN CHECKED FOR CLOTS   nRBC 0.0 0.0 - 0.2 %    Comment: Performed at [facility], [address], [place], [place] [address]   Lactic acid, plasma     Status: Abnormal   Collection Time: 09/20/19  5:44 PM  Result Value Ref Range   Lactic Acid, Venous 2.7 (HH) 0.5 - 1.9 mmol/L    Comment: CRITICAL RESULT CALLED TO, READ BACK BY AND VERIFIED WITH: MINTER,B. AT 1825 ON 09/20/2019 BY EVA Performed at [facility], [address], [place], [place] [address]   Ethanol     Status: None   Collection Time: 09/20/19  5:44 PM  Result Value Ref Range   Alcohol, Ethyl (B) <10 <10 mg/dL    Comment: (NOTE) Lowest detectable limit for serum alcohol is 10 mg/dL. For medical purposes only. Performed at [facility], [address] [place], [place] [address]   Ammonia     Status: Abnormal   Collection Time: 09/20/19  6:38 PM  Result Value Ref Range   Ammonia 53 (H) 9 - 35 umol/L    Comment: Performed at [facility], [address] [place], [place] [address]  Lactic acid, plasma     Status: None   Collection Time: 09/20/19  6:39 PM  Result Value Ref Range   Lactic Acid, Venous 1.8 0.5 - 1.9 mmol/L    Comment: Performed at [facility], [address] [place], [place] [address]    Chemistries  Recent Labs  Lab 09/20/19 1744  NA 133*  K 3.8  CL 100  CO2 24  GLUCOSE 123*  BUN 25*  CREATININE 2.36*  CALCIUM 8.3*  AST 41  ALT 22  ALKPHOS 78  BILITOT 1.9*   ------------------------------------------------------------------------------------------------------------------  ------------------------------------------------------------------------------------------------------------------ GFR: Estimated Creatinine Clearance: 40 mL/min (A) (by C-G formula based on SCr of 2.36 mg/dL (H)). Liver Function Tests: Recent Labs  Lab 09/20/19 1744  AST 41  ALT 22  ALKPHOS 78  BILITOT 1.9*  PROT 6.9  ALBUMIN 2.2*   No results for input(s): LIPASE, AMYLASE in the last 168 hours. Recent Labs  Lab 09/20/19 1838  AMMONIA 53*   Coagulation Profile: No results for input(s): INR, PROTIME in the last 168  hours. Cardiac Enzymes: No results for input(s): CKTOTAL, CKMB, CKMBINDEX, TROPONINI in the last 168 hours. BNP (last 3 results) No results for input(s): PROBNP in the last 8760 hours. HbA1C: No results for input(s): HGBA1C in the last 72 hours. CBG: Recent Labs  Lab 09/20/19 1726  GLUCAP 123*   Lipid Profile: No results for input(s): CHOL, HDL, LDLCALC, TRIG, CHOLHDL, LDLDIRECT in the last 72 hours. Thyroid Function Tests: No results for input(s): TSH, T4TOTAL, FREET4, T3FREE, THYROIDAB in the last 72 hours. Anemia Panel: No results for input(s): VITAMINB12, FOLATE, FERRITIN, TIBC, IRON, RETICCTPCT in the last 72 hours.  --------------------------------------------------------------------------------------------------------------- Urine analysis:    Component Value Date/Time   COLORURINE AMBER (A) 08/13/2019 1309   APPEARANCEUR CLOUDY (A) 08/13/2019 1309   LABSPEC 1.014 08/13/2019 1309   PHURINE 5.0 08/13/2019 1309   GLUCOSEU >=500 (A) 08/13/2019 1309   HGBUR LARGE (A) 08/13/2019 1309   BILIRUBINUR NEGATIVE 08/13/2019 1309   KETONESUR NEGATIVE 08/13/2019 1309   PROTEINUR 100 (A) 08/13/2019 1309   NITRITE NEGATIVE 08/13/2019 1309   LEUKOCYTESUR NEGATIVE 08/13/2019 1309      Imaging Results:    Ct Head Wo Contrast  Result Date: 09/20/2019 CLINICAL DATA:  Encephalopathy confusion EXAM: CT HEAD WITHOUT CONTRAST TECHNIQUE: Contiguous axial images were obtained from the base of the skull through the vertex without intravenous contrast. COMPARISON:  CT brain 08/24/2019 FINDINGS: Brain: No acute territorial infarction, hemorrhage, or intracranial mass. Mild atrophy. Mild hypodensity in the white matter consistent with small vessel ischemic change. Stable ventricle size Vascular: No hyperdense vessels.  Carotid vascular calcification Skull: Normal. Negative for fracture or focal lesion. Sinuses/Orbits: Mucosal thickening in the sinuses Other: None. IMPRESSION: 1. No CT evidence for  acute intracranial abnormality. 2. Atrophy and mild small vessel ischemic changes of the white matter Electronically Signed   By: Donavan Foil M.D.   On: 09/20/2019 19:16   Dg Chest Portable 1 View  Result Date: 09/20/2019 CLINICAL DATA:  AMS, pt missed renal dialysis today, pt restless and unable to follow commands, held back flat for image EXAM: PORTABLE CHEST 1 VIEW COMPARISON:  09/09/2019 FINDINGS: Cardiac silhouette borderline enlarged. No mediastinal or hilar masses. Clear lungs.  No pleural effusion or pneumothorax. Right internal jugular dual-lumen tunneled central venous catheter is stable.  Skeletal structures are grossly intact. IMPRESSION: No acute cardiopulmonary disease. Electronically Signed   By: Lajean Manes M.D.   On: 09/20/2019 17:59    EKG reveals sinus tachycardia with a ventricular rate of 190 QTC of 477 no ST elevation    Assessment & Plan:    Active Problems:   Altered mental status   1. Acute metabolic encephalopathy 1. Likely due to elevated ammonia 2. CT Head r/o acute intracranial pathology 3. TSH to r/o thyroid derrangement 4. Glucose 123 rules out acute hypo/hyperglycemia 5. Patient missed dialysis, but today's BUN would not likely cause altered mental status 6. Lactulose started in ED - patient's mental status was already clearing at the time of my exam 7. Continue to monitor 2. Lactic acidosis 1. Trend down to 1.8 3. Protein Cal malnutrition 1. Encourage nutrient dense food choices 2. Albumin 2.2 4. Hyponatremia 1. Mild  2. No correction at this time 3. Dialysis planned for tomorrow 4. AM CMP, mag and phos for dialysis 5. DMII with complication 1. Sliding scale 2. Patient takes 20 units long acting insulin at home 3. Start 10 units long acting insulin here 4. Carb modified diet 5. Last Hgb A1C 8.5 6. Continue to monitor with CBGs 6. ESRD 1. T TH SA schedule 2. Consult nephro 3. Last Dialysis was Thursday 4. Dialysis access - Right Tun  cath   DVT Prophylaxis-   Heparin - SCDs   AM Labs Ordered, also please review Full Orders  Code Status:  Full  Admission status: Inpatinet: Based on patients clinical presentation and evaluation of above clinical data, I have made determination that patient meets Inpatient criteria at this time.  Time spent in minutes : 65   Rolla Plate M.D on 09/21/2019 at 3:08 AM

## 2019-09-21 NOTE — Progress Notes (Signed)
Pt is alert and watching TV. Pt said he finished his breakfast and he tolerated taking all of his medications. Will continue to monitor patient today.

## 2019-09-21 NOTE — Progress Notes (Signed)
Per HPI: Edward Rocha  is a 53 y.o. male, with history of ESRD on dialysis Tuesday Thursday Saturday, thyroid disease, hypertension, hyperlipidemia, diabetes mellitus type 2, and elevated LFTs presents to the ED today via EMS.  Patient is angry he does not know why he is here today.  He reports that his wife said that he had speech problems and that that is why he is here, but that he did not have speech problems.  It is reported that patient's wife reported altered mental status.  Asked patient if he had been confused, and patient said no.  He reports that he remembers EMS and remembers his wife bring him in here.  ER staff reports that he was recently reoriented and did not remember any of that.  ER staff also reports that upon arrival patient had incomprehensible speech.  Patient is a very poor historian, and cannot say when any of the started.  Of note he did miss dialysis today.  He reports that he just forgot dialysis reports he has only been getting dialysis for about a week and a half.  Dialysis access is in the right chest tunneled cath.  Patient reports that he sometimes becomes fatigued minutes getting close to time to have dialysis, but he is not fatigued now.  Patient reports he still makes urine.  His last void was this morning.  Sometimes he has difficulty as he has the urge to urinate but cannot make urine.  Patient is also supposed to be on lactulose.  Patient reports no known history of liver disease.  He does have a documented history of elevated LFTs.  Patient did have a hepatitis panel on August 24, 2019 that was nonreactive for hep B surface antigen core total antibody B E antibody.  Patient had a negative HIV screen in July 2020.  I do not see hep C study done.  Wife reported to ED staff that she had not been giving him his lactulose and he will probably need an ammonia check.  11/22: Patient was admitted with acute metabolic encephalopathy in the setting of elevated ammonia levels.   Lactulose has been given and will plan to repeat levels in a.m. and see if condition has improved with decreasing levels, otherwise will consider brain MRI at that time.  Discussed with nephrology who will plan to dialyze patient either today or tomorrow.  Supplement potassium and magnesium today and recheck levels in a.m.  Patient noted to have low hemoglobin levels and will check stool occult as well as anemia panel and recheck CBC in a.m.  Continue on SCDs for now just in case.   Total care time: 30 minutes.

## 2019-09-22 LAB — BASIC METABOLIC PANEL
Anion gap: 9 (ref 5–15)
BUN: 24 mg/dL — ABNORMAL HIGH (ref 6–20)
CO2: 23 mmol/L (ref 22–32)
Calcium: 7.8 mg/dL — ABNORMAL LOW (ref 8.9–10.3)
Chloride: 103 mmol/L (ref 98–111)
Creatinine, Ser: 1.44 mg/dL — ABNORMAL HIGH (ref 0.61–1.24)
GFR calc Af Amer: >60 mL/min (ref >60)
GFR calc non Af Amer: 54 mL/min — ABNORMAL LOW (ref >60)
Glucose, Bld: 74 mg/dL (ref 70–99)
Potassium: 3.1 mmol/L — ABNORMAL LOW (ref 3.5–5.1)
Sodium: 135 mmol/L (ref 135–145)

## 2019-09-22 LAB — CBC
HCT: 21.5 % — ABNORMAL LOW (ref 39.0–52.0)
Hemoglobin: 6.8 g/dL — CL (ref 13.0–17.0)
MCH: 28 pg (ref 26.0–34.0)
MCHC: 31.6 g/dL (ref 30.0–36.0)
MCV: 88.5 fL (ref 80.0–100.0)
Platelets: 57 10*3/uL — ABNORMAL LOW (ref 150–400)
RBC: 2.43 MIL/uL — ABNORMAL LOW (ref 4.22–5.81)
RDW: 16.2 % — ABNORMAL HIGH (ref 11.5–15.5)
WBC: 4.4 10*3/uL (ref 4.0–10.5)
nRBC: 0 % (ref 0.0–0.2)

## 2019-09-22 LAB — MAGNESIUM: Magnesium: 1.5 mg/dL — ABNORMAL LOW (ref 1.7–2.4)

## 2019-09-22 LAB — SUSCEPTIBILITY, GRAM POS RODS

## 2019-09-22 LAB — GLUCOSE, CAPILLARY
Glucose-Capillary: 67 mg/dL — ABNORMAL LOW (ref 70–99)
Glucose-Capillary: 152 mg/dL — ABNORMAL HIGH (ref 70–99)
Glucose-Capillary: 130 mg/dL — ABNORMAL HIGH (ref 70–99)
Glucose-Capillary: 132 mg/dL — ABNORMAL HIGH (ref 70–99)

## 2019-09-22 LAB — ORGANISM ID BY SEQUENCING

## 2019-09-22 LAB — AMMONIA: Ammonia: 44 umol/L — ABNORMAL HIGH (ref 9–35)

## 2019-09-22 LAB — ABO/RH: ABO/RH(D): O POS

## 2019-09-22 LAB — CULTURE, BLOOD (ROUTINE X 2): Special Requests: ADEQUATE

## 2019-09-22 LAB — PHOSPHORUS: Phosphorus: 4.4 mg/dL (ref 2.5–4.6)

## 2019-09-22 LAB — PREPARE RBC (CROSSMATCH)

## 2019-09-22 MED ORDER — INSULIN DETEMIR 100 UNIT/ML ~~LOC~~ SOLN
7.0000 [IU] | Freq: Every day | SUBCUTANEOUS | Status: DC
  Administered 2019-09-22: 22:00:00 7 [IU] via SUBCUTANEOUS
  Filled 2019-09-22 (×2): qty 0.07

## 2019-09-22 MED ORDER — LACTULOSE 10 GM/15ML PO SOLN
10.0000 g | Freq: Every day | ORAL | Status: DC
  Administered 2019-09-22 – 2019-09-23 (×2): 10 g via ORAL
  Filled 2019-09-22 (×2): qty 30

## 2019-09-22 MED ORDER — SODIUM CHLORIDE 0.9% IV SOLUTION
Freq: Once | INTRAVENOUS | Status: AC
  Administered 2019-09-22: 10:00:00 via INTRAVENOUS

## 2019-09-22 MED ORDER — POTASSIUM CHLORIDE CRYS ER 20 MEQ PO TBCR
40.0000 meq | EXTENDED_RELEASE_TABLET | Freq: Once | ORAL | Status: AC
  Administered 2019-09-22: 40 meq via ORAL
  Filled 2019-09-22: qty 2

## 2019-09-22 MED ORDER — MAGNESIUM SULFATE 2 GM/50ML IV SOLN
2.0000 g | Freq: Once | INTRAVENOUS | Status: AC
  Administered 2019-09-22: 10:00:00 2 g via INTRAVENOUS

## 2019-09-22 NOTE — Progress Notes (Signed)
PROGRESS NOTE    Edward Rocha.  [id number] DOB: 1965/11/09 DOA: 09/20/2019 PCP: Neale Burly, MD   Brief Narrative:  Per HPI: Edward Rocha a10 y.o.male,with history of ESRD on dialysis Tuesday Thursday Saturday, thyroid disease, hypertension, hyperlipidemia, diabetes mellitus type 2, and elevated LFTs presents to the ED today via EMS. Patient is angry he does not know why he is here today. He reports that his wife said that he had speech problems and that that is why he is here, but that he did not have speech problems. It is reported that patient'swife reported altered mental status. Asked patient if he had been confused, and patient said no. He reports that he remembers EMS and remembers his wife bring him in here. ER staff reports that he was recently reoriented and did not remember any of that. ER staff also reports that upon arrival patient had incomprehensible speech. Patient is a very poor historian, and cannot say when any of the started. Of note he did miss dialysis today. He reports that he just forgot dialysis reports he has only been getting dialysis for about a week and a half. Dialysis access is in the right chest tunneled cath. Patient reports that he sometimes becomes fatigued minutes getting close to time to have dialysis, but he is not fatigued now. Patient reports he still makes urine. His last void was this morning. Sometimes he has difficulty as he has the urge to urinate but cannot make urine. Patient is also supposed to be on lactulose. Patient reports no known history of liver disease. He does have a documented history of elevated LFTs. Patient did have a hepatitis panel on August 24, 2019 that was nonreactive for hep B surface antigen core total antibody B E antibody.Patient had a negative HIV screen in July 2020. I do not see hep C study done. Wife reported to ED staff that she had not been giving him his lactulose and he will probably need  an ammonia check.  11/22: Patient was admitted with acute metabolic encephalopathy in the setting of elevated ammonia levels.  Lactulose has been given and will plan to repeat levels in a.m. and see if condition has improved with decreasing levels, otherwise will consider brain MRI at that time.  Discussed with nephrology who will plan to dialyze patient either today or tomorrow.  Supplement potassium and magnesium today and recheck levels in a.m.  Patient noted to have low hemoglobin levels and will check stool occult as well as anemia panel and recheck CBC in a.m.  Continue on SCDs for now just in case.  11/23: Patient has improvement in encephalopathy with ammonia levels that have decreased to 44 today.  We will continue on daily lactulose and follow levels.  Continue to replete potassium and magnesium with plans for further hemodialysis today.  Worsening anemia noted and 1 unit PRBC transfusion planned and will recheck levels in a.m.  Stool occult still pending.  Assessment & Plan:   Active Problems:   Altered mental status   Acute hepatic encephalopathy-improving -Ammonia levels decreasing and will maintain on low-dose lactulose and monitor repeat levels in a.m. -CT head with no acute findings -TSH 1.691 and B12 within normal limits  Acute on chronic anemia -May be related to CKD with ESA given yesterday -Plan for PRBC transfusion of 1 unit today with dialysis -Fecal occult pending -Repeat CBC in a.m.  Hypokalemia/hypomagnesemia -Continue repletion and reevaluate in a.m.  Hypertension-soft blood pressure readings -Continue labetalol with decrease in  Procardia dose, Lasix added by nephrology 11/22  ESRD TTS -Appreciate nephrology consultation with possible plans for dialysis today especially in light of 1 unit PRBC transfusion  Hyponatremia-resolved -Continue a.m. monitoring  Type 2 diabetes -Currently stable with lower blood glucose readings -Appreciate diabetes coordinator  recommendations will decrease Lantus dosing to 7 units at bedtime  DVT prophylaxis: SCDs Code Status: Full Family Communication: None at bedside Disposition Plan: Hemodialysis for today and continue on lactulose and monitor repeat levels in a.m. along with 1 unit PRBC transfusion with follow-up CBC in a.m.   Consultants:   Nephrology  Procedures:   None  Antimicrobials:  Anti-infectives (From admission, onward)   None       Subjective: Patient seen and evaluated today with no new acute complaints or concerns. No acute concerns or events noted overnight.  He has some minimal right in a.m. right lip swelling, but is much more alert and less confused today.  Objective: Vitals:   09/21/19 1306 09/21/19 2132 09/22/19 0540 09/22/19 0800  BP: (!) 105/46 (!) 104/46 115/67 (!) 115/58  Pulse: 100 99 97 97  Resp: 18  16   Temp: 98.7 F (37.1 C) 99.5 F (37.5 C) 99.4 F (37.4 C) 98.6 F (37 C)  TempSrc: Oral Oral Oral Oral  SpO2: 98% 98% 97% 98%  Weight:   94.9 kg   Height:        Intake/Output Summary (Last 24 hours) at 09/22/2019 1159 Last data filed at 09/22/2019 0857 Gross per 24 hour  Intake 1060 ml  Output 1350 ml  Net -290 ml   Filed Weights   09/20/19 1712 09/21/19 0655 09/22/19 0540  Weight: 86 kg 88.4 kg 94.9 kg    Examination:  General exam: Appears calm and comfortable  Respiratory system: Clear to auscultation. Respiratory effort normal. Cardiovascular system: S1 & S2 heard, RRR. No JVD, murmurs, rubs, gallops or clicks. No pedal edema. Gastrointestinal system: Abdomen is nondistended, soft and nontender. No organomegaly or masses felt. Normal bowel sounds heard. Central nervous system: Alert and oriented. No focal neurological deficits. Extremities: Symmetric 5 x 5 power. Skin: No rashes, lesions or ulcers Psychiatry: Judgement and insight appear normal. Mood & affect appropriate.     Data Reviewed: I have personally reviewed following labs and  imaging studies  CBC: Recent Labs  Lab 09/20/19 1744 09/21/19 0702 09/22/19 0647  WBC 4.7 4.7 4.4  HGB 8.0* 7.4* 6.8*  HCT 25.3* 23.4* 21.5*  MCV 88.2 87.6 88.5  PLT 63* 59* 57*   Basic Metabolic Panel: Recent Labs  Lab 09/20/19 1744 09/21/19 0702 09/22/19 0647  NA 133* 135 135  K 3.8 3.3* 3.1*  CL 100 104 103  CO2 24 23 23   GLUCOSE 123* 116* 74  BUN 25* 24* 24*  CREATININE 2.36* 1.63* 1.44*  CALCIUM 8.3* 8.2* 7.8*  MG  --  1.5* 1.5*  PHOS  --  5.0* 4.4   GFR: Estimated Creatinine Clearance: 65.5 mL/min (A) (by C-G formula based on SCr of 1.44 mg/dL (H)). Liver Function Tests: Recent Labs  Lab 09/20/19 1744 09/21/19 0702  AST 41 34  ALT 22 19  ALKPHOS 78 69  BILITOT 1.9* 1.8*  PROT 6.9 6.0*  ALBUMIN 2.2* 1.9*   No results for input(s): LIPASE, AMYLASE in the last 168 hours. Recent Labs  Lab 09/20/19 1838 09/22/19 0647  AMMONIA 53* 44*   Coagulation Profile: No results for input(s): INR, PROTIME in the last 168 hours. Cardiac Enzymes: No results for input(s):  CKTOTAL, CKMB, CKMBINDEX, TROPONINI in the last 168 hours. BNP (last 3 results) No results for input(s): PROBNP in the last 8760 hours. HbA1C: No results for input(s): HGBA1C in the last 72 hours. CBG: Recent Labs  Lab 09/21/19 1214 09/21/19 1617 09/21/19 2133 09/22/19 0716 09/22/19 1101  GLUCAP 159* 137* 131* 67* 152*   Lipid Profile: No results for input(s): CHOL, HDL, LDLCALC, TRIG, CHOLHDL, LDLDIRECT in the last 72 hours. Thyroid Function Tests: Recent Labs    09/21/19 0702  TSH 1.691   Anemia Panel: Recent Labs    09/21/19 0702  VITAMINB12 2,017*  FOLATE 21.6  FERRITIN 162  TIBC 253  IRON 84  RETICCTPCT 4.5*   Sepsis Labs: Recent Labs  Lab 09/20/19 1744 09/20/19 1839  LATICACIDVEN 2.7* 1.8    Recent Results (from the past 240 hour(s))  SARS CORONAVIRUS 2 (TAT 6-24 HRS) Nasopharyngeal Nasopharyngeal Swab     Status: None   Collection Time: 09/20/19  8:13 PM    Specimen: Nasopharyngeal Swab  Result Value Ref Range Status   SARS Coronavirus 2 NEGATIVE NEGATIVE Final    Comment: (NOTE) SARS-CoV-2 target nucleic acids are NOT DETECTED. The SARS-CoV-2 RNA is generally detectable in upper and lower respiratory specimens during the acute phase of infection. Negative results do not preclude SARS-CoV-2 infection, do not rule out co-infections with other pathogens, and should not be used as the sole basis for treatment or other patient management decisions. Negative results must be combined with clinical observations, patient history, and epidemiological information. The expected result is Negative. Fact Sheet for Patients: SugarRoll.be Fact Sheet for Healthcare Providers: https://www.woods-mathews.com/ This test is not yet approved or cleared by the [place] FDA and  has been authorized for detection and/or diagnosis of SARS-CoV-2 by FDA under an Emergency Use Authorization (EUA). This EUA will remain  in effect (meaning this test can be used) for the duration of the COVID-19 declaration under Section 56 4(b)(1) of the Act, 21 U.S.C. section 360bbb-3(b)(1), unless the authorization is terminated or revoked sooner. Performed at [facility], [address] [place], [place] [address]          Radiology Studies: Ct Head Wo Contrast  Result Date: 09/20/2019 CLINICAL DATA:  Encephalopathy confusion EXAM: CT HEAD WITHOUT CONTRAST TECHNIQUE: Contiguous axial images were obtained from the base of the skull through the vertex without intravenous contrast. COMPARISON:  CT brain 08/24/2019 FINDINGS: Brain: No acute territorial infarction, hemorrhage, or intracranial mass. Mild atrophy. Mild hypodensity in the white matter consistent with small vessel ischemic change. Stable ventricle size Vascular: No hyperdense vessels.  Carotid vascular calcification Skull: Normal. Negative for fracture or focal  lesion. Sinuses/Orbits: Mucosal thickening in the sinuses Other: None. IMPRESSION: 1. No CT evidence for acute intracranial abnormality. 2. Atrophy and mild small vessel ischemic changes of the white matter Electronically Signed   By: Donavan Foil M.D.   On: 09/20/2019 19:16   Dg Chest Portable 1 View  Result Date: 09/20/2019 CLINICAL DATA:  AMS, pt missed renal dialysis today, pt restless and unable to follow commands, held back flat for image EXAM: PORTABLE CHEST 1 VIEW COMPARISON:  09/09/2019 FINDINGS: Cardiac silhouette borderline enlarged. No mediastinal or hilar masses. Clear lungs.  No pleural effusion or pneumothorax. Right internal jugular dual-lumen tunneled central venous catheter is stable. Skeletal structures are grossly intact. IMPRESSION: No acute cardiopulmonary disease. Electronically Signed   By: Lajean Manes M.D.   On: 09/20/2019 17:59        Scheduled  Meds:  atorvastatin  80 mg Oral Daily   Chlorhexidine Gluconate Cloth  6 each Topical Daily   chlorthalidone  50 mg Oral Daily   clopidogrel  75 mg Oral Daily   darbepoetin (ARANESP) injection - NON-DIALYSIS  100 mcg Subcutaneous Q Mon-1800   furosemide  40 mg Oral BID   gabapentin  300 mg Oral BID   insulin aspart  0-15 Units Subcutaneous TID WC   insulin detemir  7 Units Subcutaneous QHS   labetalol  300 mg Oral BID   lactulose  10 g Oral Daily   NIFEdipine  30 mg Oral Daily   Continuous Infusions:   LOS: 1 day    Time spent: 30 minutes    Lieutenant Abarca Darleen Crocker, DO [facility] Pager [phone]  If 7PM-7AM, please contact night-coverage [email or web address] Password [email or web address] 09/22/2019, 11:59 AM

## 2019-09-22 NOTE — Progress Notes (Signed)
Patient ID: Edward Patricia., male   DOB: 1966-06-17, 53 y.o.   MRN: [id number] S: Noted to have a swollen right eye.  Denies trauma, itching, or previous history. O:BP (!) 115/58 (BP Location: Left Arm)   Pulse 97   Temp 98.6 F (37 C) (Oral)   Resp 16   Ht 6\' 1"  (1.854 m)   Wt 94.9 kg   SpO2 98%   BMI 27.60 kg/m   Intake/Output Summary (Last 24 hours) at 09/22/2019 1219 Last data filed at 09/22/2019 0857 Gross per 24 hour  Intake 1060 ml  Output 1350 ml  Net -290 ml   Intake/Output: I/O last 3 completed shifts: In: 20 [P.O.:720; IV Piggyback:100] Out: 1350 [Urine:1350]  Intake/Output this shift:  Total I/O In: 240 [P.O.:240] Out: -  Weight change: 8.9 kg Gen: NAD CVS: no rub Resp: cta Abd: +BS, soft, NTND Ext: 1+ edema LLE > R  Recent Labs  Lab 09/20/19 1744 09/21/19 0702 09/22/19 0647  NA 133* 135 135  K 3.8 3.3* 3.1*  CL 100 104 103  CO2 24 23 23   GLUCOSE 123* 116* 74  BUN 25* 24* 24*  CREATININE 2.36* 1.63* 1.44*  ALBUMIN 2.2* 1.9*  --   CALCIUM 8.3* 8.2* 7.8*  PHOS  --  5.0* 4.4  AST 41 34  --   ALT 22 19  --    Liver Function Tests: Recent Labs  Lab 09/20/19 1744 09/21/19 0702  AST 41 34  ALT 22 19  ALKPHOS 78 69  BILITOT 1.9* 1.8*  PROT 6.9 6.0*  ALBUMIN 2.2* 1.9*   No results for input(s): LIPASE, AMYLASE in the last 168 hours. Recent Labs  Lab 09/20/19 1838 09/22/19 0647  AMMONIA 53* 44*   CBC: Recent Labs  Lab 09/20/19 1744 09/21/19 0702 09/22/19 0647  WBC 4.7 4.7 4.4  HGB 8.0* 7.4* 6.8*  HCT 25.3* 23.4* 21.5*  MCV 88.2 87.6 88.5  PLT 63* 59* 57*   Cardiac Enzymes: No results for input(s): CKTOTAL, CKMB, CKMBINDEX, TROPONINI in the last 168 hours. CBG: Recent Labs  Lab 09/21/19 1214 09/21/19 1617 09/21/19 2133 09/22/19 0716 09/22/19 1101  GLUCAP 159* 137* 131* 67* 152*    Iron Studies:  Recent Labs    09/21/19 0702  IRON 84  TIBC 253  FERRITIN 162   Studies/Results: Ct Head Wo Contrast  Result Date:  09/20/2019 CLINICAL DATA:  Encephalopathy confusion EXAM: CT HEAD WITHOUT CONTRAST TECHNIQUE: Contiguous axial images were obtained from the base of the skull through the vertex without intravenous contrast. COMPARISON:  CT brain 08/24/2019 FINDINGS: Brain: No acute territorial infarction, hemorrhage, or intracranial mass. Mild atrophy. Mild hypodensity in the white matter consistent with small vessel ischemic change. Stable ventricle size Vascular: No hyperdense vessels.  Carotid vascular calcification Skull: Normal. Negative for fracture or focal lesion. Sinuses/Orbits: Mucosal thickening in the sinuses Other: None. IMPRESSION: 1. No CT evidence for acute intracranial abnormality. 2. Atrophy and mild small vessel ischemic changes of the white matter Electronically Signed   By: Donavan Foil M.D.   On: 09/20/2019 19:16   Dg Chest Portable 1 View  Result Date: 09/20/2019 CLINICAL DATA:  AMS, pt missed renal dialysis today, pt restless and unable to follow commands, held back flat for image EXAM: PORTABLE CHEST 1 VIEW COMPARISON:  09/09/2019 FINDINGS: Cardiac silhouette borderline enlarged. No mediastinal or hilar masses. Clear lungs.  No pleural effusion or pneumothorax. Right internal jugular dual-lumen tunneled central venous catheter is stable. Skeletal structures are  grossly intact. IMPRESSION: No acute cardiopulmonary disease. Electronically Signed   By: Lajean Manes M.D.   On: 09/20/2019 17:59   . atorvastatin  80 mg Oral Daily  . Chlorhexidine Gluconate Cloth  6 each Topical Daily  . chlorthalidone  50 mg Oral Daily  . clopidogrel  75 mg Oral Daily  . darbepoetin (ARANESP) injection - NON-DIALYSIS  100 mcg Subcutaneous Q Mon-1800  . furosemide  40 mg Oral BID  . gabapentin  300 mg Oral BID  . insulin aspart  0-15 Units Subcutaneous TID WC  . insulin detemir  7 Units Subcutaneous QHS  . labetalol  300 mg Oral BID  . lactulose  10 g Oral Daily  . NIFEdipine  30 mg Oral Daily    BMET     Component Value Date/Time   NA 135 09/22/2019 0647   K 3.1 (L) 09/22/2019 0647   CL 103 09/22/2019 0647   CO2 23 09/22/2019 0647   GLUCOSE 74 09/22/2019 0647   BUN 24 (H) 09/22/2019 0647   CREATININE 1.44 (H) 09/22/2019 0647   CALCIUM 7.8 (L) 09/22/2019 0647   GFRNONAA 54 (L) 09/22/2019 0647   GFRAA >60 09/22/2019 0647   CBC    Component Value Date/Time   WBC 4.4 09/22/2019 0647   RBC 2.43 (L) 09/22/2019 0647   HGB 6.8 (LL) 09/22/2019 0647   HCT 21.5 (L) 09/22/2019 0647   PLT 57 (L) 09/22/2019 0647   MCV 88.5 09/22/2019 0647   MCH 28.0 09/22/2019 0647   MCHC 31.6 09/22/2019 0647   RDW 16.2 (H) 09/22/2019 0647   LYMPHSABS 3.5 08/26/2019 0500   MONOABS 2.2 (H) 08/26/2019 0500   EOSABS 0.4 08/26/2019 0500   BASOSABS 0.1 08/26/2019 0500     Assessment/Plan:  1. AKI requiring IHD since 08/17/19 until present time at [facility] on TTS schedule, however his BUN/Cr have continued to improve without HD.  No indication for dialysis at this time and will need to arrange for close follow up after discharge and may need to remove HD cathter prior to discharge if his renal function continues to remain stable 2. AMS- due to acute hepatic encephalopathy- improving with lactulose 3. Right eye swelling- unclear etiology.   4. Acute on chronic anemia- to receive blood transfusion.  History of epistaxis in the past. 5. Cirrhosis- presumably due to etoh diagnosed during his hospitalization last month.   ?etiology.  Nothing documented other than abnormal LFT's in the past.  Will need further workup.  6. Thrombocytopenia- chronic, cont to follow.   Donetta Potts, MD [employer] [phone]

## 2019-09-22 NOTE — Progress Notes (Addendum)
CRITICAL VALUE ALERT  Critical Value:  Hemoglobin 6.8  Date & Time Notied: 11/23 @0745 .  Provider Notified: Manuella Ghazi, MD.  Orders Received/Actions taken: Order to transfuse 1 unit of blood.

## 2019-09-22 NOTE — Progress Notes (Signed)
Inpatient Diabetes Program Recommendations  AACE/ADA: New Consensus Statement on Inpatient Glycemic Control  Target Ranges:  Prepandial:   less than 140 mg/dL      Peak postprandial:   less than 180 mg/dL (1-2 hours)      Critically ill patients:  140 - 180 mg/dL   Results for JUNIOR, FETTERHOFF (MRN [id number]) as of 09/22/2019 11:11  Ref. Range 09/21/2019 12:14 09/21/2019 16:17 09/21/2019 21:33 09/22/2019 07:16 09/22/2019 11:01  Glucose-Capillary Latest Ref Range: 70 - 99 mg/dL 159 (H) 137 (H) 131 (H) 67 (L) 152 (H)   Review of Glycemic Control  Diabetes history: DM2 Outpatient Diabetes medications: Basaglar 20 units QPM, Jardiance 10 mg daily, Glipizide 5 mg BID Current orders for Inpatient glycemic control: Levemir 10 units QHS, Novolog 0-15 units TID with meals  Inpatient Diabetes Program Recommendations:   Insulin - Basal: Fasting glucose 67 mg/dl today. Please consider decreasing Levemir to 7 units QHS.  Thanks, Barnie Alderman, RN, MSN, CDE Diabetes Coordinator Inpatient Diabetes Program [phone] (Team Pager from 8am to 5pm)

## 2019-09-23 LAB — CBC
HCT: 24.7 % — ABNORMAL LOW (ref 39.0–52.0)
Hemoglobin: 8.2 g/dL — ABNORMAL LOW (ref 13.0–17.0)
MCH: 29.1 pg (ref 26.0–34.0)
MCHC: 33.2 g/dL (ref 30.0–36.0)
MCV: 87.6 fL (ref 80.0–100.0)
Platelets: 58 10*3/uL — ABNORMAL LOW (ref 150–400)
RBC: 2.82 MIL/uL — ABNORMAL LOW (ref 4.22–5.81)
RDW: 15.6 % — ABNORMAL HIGH (ref 11.5–15.5)
WBC: 4.7 10*3/uL (ref 4.0–10.5)
nRBC: 0 % (ref 0.0–0.2)

## 2019-09-23 LAB — TYPE AND SCREEN
ABO/RH(D): O POS
Antibody Screen: NEGATIVE
Unit division: 0

## 2019-09-23 LAB — BPAM RBC
Blood Product Expiration Date: 202012012359
ISSUE DATE / TIME: 202011231333
Unit Type and Rh: 9500

## 2019-09-23 LAB — BASIC METABOLIC PANEL
Anion gap: 8 (ref 5–15)
BUN: 21 mg/dL — ABNORMAL HIGH (ref 6–20)
CO2: 22 mmol/L (ref 22–32)
Calcium: 7.8 mg/dL — ABNORMAL LOW (ref 8.9–10.3)
Chloride: 104 mmol/L (ref 98–111)
Creatinine, Ser: 1.29 mg/dL — ABNORMAL HIGH (ref 0.61–1.24)
GFR calc Af Amer: >60 mL/min (ref >60)
GFR calc non Af Amer: >60 mL/min (ref >60)
Glucose, Bld: 80 mg/dL (ref 70–99)
Potassium: 3.2 mmol/L — ABNORMAL LOW (ref 3.5–5.1)
Sodium: 134 mmol/L — ABNORMAL LOW (ref 135–145)

## 2019-09-23 LAB — GLUCOSE, CAPILLARY: Glucose-Capillary: 83 mg/dL (ref 70–99)

## 2019-09-23 LAB — MAGNESIUM: Magnesium: 1.5 mg/dL — ABNORMAL LOW (ref 1.7–2.4)

## 2019-09-23 LAB — AMMONIA: Ammonia: 51 umol/L — ABNORMAL HIGH (ref 9–35)

## 2019-09-23 MED ORDER — NIFEDIPINE ER 30 MG PO TB24: 30.0000 mg | ORAL_TABLET | Freq: Every day | ORAL | 2 refills | Status: DC

## 2019-09-23 MED ORDER — MAGNESIUM SULFATE 2 GM/50ML IV SOLN
2.0000 g | Freq: Once | INTRAVENOUS | Status: AC
  Administered 2019-09-23: 2 g via INTRAVENOUS
  Filled 2019-09-23: qty 50

## 2019-09-23 MED ORDER — DOXYCYCLINE HYCLATE 50 MG PO CAPS: 50.0000 mg | ORAL_CAPSULE | Freq: Two times a day (BID) | ORAL | 0 refills | Status: AC

## 2019-09-23 MED ORDER — MAGNESIUM OXIDE 400 MG PO TABS: 400.0000 mg | ORAL_TABLET | Freq: Every day | ORAL | 0 refills | Status: AC

## 2019-09-23 MED ORDER — LACTULOSE 10 GM/15ML PO SOLN: 10.0000 g | Freq: Every day | ORAL | 0 refills | Status: DC

## 2019-09-23 MED ORDER — POTASSIUM CHLORIDE CRYS ER 20 MEQ PO TBCR
40.0000 meq | EXTENDED_RELEASE_TABLET | Freq: Once | ORAL | Status: AC
  Administered 2019-09-23: 40 meq via ORAL
  Filled 2019-09-23: qty 2

## 2019-09-23 MED ORDER — POTASSIUM CHLORIDE ER 10 MEQ PO TBCR: 10.0000 meq | EXTENDED_RELEASE_TABLET | Freq: Every day | ORAL | 2 refills | Status: DC

## 2019-09-23 NOTE — Progress Notes (Signed)
Nsg Discharge Note  Admit Date:  09/20/2019 Discharge date: 09/23/2019   Edward Rocha. to be D/C'd home per MD order.  AVS completed.  Copy for chart, and copy for patient signed, and dated. Patient/caregiver able to verbalize understanding.  Discharge Medication: Allergies as of 09/23/2019      Reactions   Other    Pt received platelets and had a bad reaction from infusion       Medication List    TAKE these medications   acetaminophen 500 MG tablet Commonly known as: TYLENOL Take 1,000 mg by mouth daily as needed for moderate pain or headache.   atorvastatin 80 MG tablet Commonly known as: LIPITOR Take 80 mg by mouth daily.   Basaglar KwikPen 100 UNIT/ML Sopn Inject 20 Units into the skin every evening.   chlorthalidone 50 MG tablet Commonly known as: HYGROTON Take 50 mg by mouth daily.   clopidogrel 75 MG tablet Commonly known as: PLAVIX Take 75 mg by mouth daily.   diphenhydrAMINE 25 MG tablet Commonly known as: BENADRYL Take 25 mg by mouth daily as needed for itching.   doxycycline 50 MG capsule Commonly known as: VIBRAMYCIN Take 1 capsule (50 mg total) by mouth 2 (two) times daily for 7 days. Notes to patient: Space out 12 hours such as 8am and 8pm.   gabapentin 300 MG capsule Commonly known as: NEURONTIN Take 300 mg by mouth 3 (three) times daily.   glipiZIDE 5 MG tablet Commonly known as: GLUCOTROL Take 5 mg by mouth 2 (two) times daily before a meal.   Jardiance 10 MG Tabs tablet Generic drug: empagliflozin Take 10 mg by mouth daily.   labetalol 300 MG tablet Commonly known as: NORMODYNE Take 300 mg by mouth 2 (two) times daily.   lactulose 10 GM/15ML solution Commonly known as: CHRONULAC Take 15 mLs (10 g total) by mouth daily. Start taking on: September 24, 2019   magnesium oxide 400 MG tablet Commonly known as: MAG-OX Take 1 tablet (400 mg total) by mouth daily.   NIFEdipine 30 MG 24 hr tablet Commonly known as: ADALAT CC Take 1  tablet (30 mg total) by mouth daily. Start taking on: September 24, 2019 What changed:   medication strength  how much to take   oxyCODONE-acetaminophen 5-325 MG tablet Commonly known as: PERCOCET/ROXICET Take 1 tablet by mouth every 6 (six) hours as needed. What changed: reasons to take this   potassium chloride 10 MEQ tablet Commonly known as: KLOR-CON Take 1 tablet (10 mEq total) by mouth daily.       Discharge Assessment: Vitals:   09/22/19 2151 09/23/19 0442  BP: (!) 107/44 (!) 123/54  Pulse: (!) 101 98  Resp: 18 18  Temp: 99.5 F (37.5 C) 99 F (37.2 C)  SpO2: 96% 97%   Skin clean, dry and intact without evidence of skin break down, no evidence of skin tears noted. IV catheter discontinued intact. Site without signs and symptoms of complications - no redness or edema noted at insertion site, patient denies c/o pain - only slight tenderness at site.  Dressing with slight pressure applied.  D/c Instructions-Education: Discharge instructions given to patient/family with verbalized understanding. D/c education completed with patient/family including follow up instructions, medication list, d/c activities limitations if indicated, with other d/c instructions as indicated by MD - patient able to verbalize understanding, all questions fully answered. Patient instructed to return to ED, call 911, or call MD for any changes in condition.  Patient escorted  via WC, and D/C home via private auto.  Carney Corners, RN 09/23/2019 10:49 AM

## 2019-09-23 NOTE — Progress Notes (Signed)
Patient ID: Edward Rocha., male   DOB: February 19, 1966, 53 y.o.   MRN: [id number] S: Feels better no new complaints.  O:BP (!) 123/54 (BP Location: Right Arm)   Pulse 98   Temp 99 F (37.2 C) (Oral)   Resp 18   Ht 6\' 1"  (1.854 m)   Wt 90.1 kg   SpO2 97%   BMI 26.21 kg/m   Intake/Output Summary (Last 24 hours) at 09/23/2019 1025 Last data filed at 09/23/2019 0500 Gross per 24 hour  Intake 490 ml  Output 750 ml  Net -260 ml   Intake/Output: I/O last 3 completed shifts: In: 730 [P.O.:480; I.V.:250] Out: 2100 [Urine:2100]  Intake/Output this shift:  No intake/output data recorded. Weight change: -4.8 kg Gen: NAD CVS: no rub Resp: cta Abd: +BS, soft, NT/ND Ext: 1+ pitting edema L>R lower extremities.  Recent Labs  Lab 09/20/19 1744 09/21/19 0702 09/22/19 0647 09/23/19 0420  NA 133* 135 135 134*  K 3.8 3.3* 3.1* 3.2*  CL 100 104 103 104  CO2 24 23 23 22   GLUCOSE 123* 116* 74 80  BUN 25* 24* 24* 21*  CREATININE 2.36* 1.63* 1.44* 1.29*  ALBUMIN 2.2* 1.9*  --   --   CALCIUM 8.3* 8.2* 7.8* 7.8*  PHOS  --  5.0* 4.4  --   AST 41 34  --   --   ALT 22 19  --   --    Liver Function Tests: Recent Labs  Lab 09/20/19 1744 09/21/19 0702  AST 41 34  ALT 22 19  ALKPHOS 78 69  BILITOT 1.9* 1.8*  PROT 6.9 6.0*  ALBUMIN 2.2* 1.9*   No results for input(s): LIPASE, AMYLASE in the last 168 hours. Recent Labs  Lab 09/20/19 1838 09/22/19 0647 09/23/19 0420  AMMONIA 53* 44* 51*   CBC: Recent Labs  Lab 09/20/19 1744 09/21/19 0702 09/22/19 0647 09/23/19 0420  WBC 4.7 4.7 4.4 4.7  HGB 8.0* 7.4* 6.8* 8.2*  HCT 25.3* 23.4* 21.5* 24.7*  MCV 88.2 87.6 88.5 87.6  PLT 63* 59* 57* 58*   Cardiac Enzymes: No results for input(s): CKTOTAL, CKMB, CKMBINDEX, TROPONINI in the last 168 hours. CBG: Recent Labs  Lab 09/22/19 0716 09/22/19 1101 09/22/19 1552 09/22/19 2151 09/23/19 0723  GLUCAP 67* 152* 130* 132* 83    Iron Studies:  Recent Labs    09/21/19 0702  IRON  84  TIBC 253  FERRITIN 162   Studies/Results: No results found. Marland Kitchen atorvastatin  80 mg Oral Daily  . Chlorhexidine Gluconate Cloth  6 each Topical Daily  . chlorthalidone  50 mg Oral Daily  . clopidogrel  75 mg Oral Daily  . darbepoetin (ARANESP) injection - NON-DIALYSIS  100 mcg Subcutaneous Q Mon-1800  . furosemide  40 mg Oral BID  . gabapentin  300 mg Oral BID  . insulin aspart  0-15 Units Subcutaneous TID WC  . insulin detemir  7 Units Subcutaneous QHS  . labetalol  300 mg Oral BID  . lactulose  10 g Oral Daily  . NIFEdipine  30 mg Oral Daily    BMET    Component Value Date/Time   NA 134 (L) 09/23/2019 0420   K 3.2 (L) 09/23/2019 0420   CL 104 09/23/2019 0420   CO2 22 09/23/2019 0420   GLUCOSE 80 09/23/2019 0420   BUN 21 (H) 09/23/2019 0420   CREATININE 1.29 (H) 09/23/2019 0420   CALCIUM 7.8 (L) 09/23/2019 0420   GFRNONAA >60 09/23/2019 [id number]  GFRAA >60 09/23/2019 0420   CBC    Component Value Date/Time   WBC 4.7 09/23/2019 0420   RBC 2.82 (L) 09/23/2019 0420   HGB 8.2 (L) 09/23/2019 0420   HCT 24.7 (L) 09/23/2019 0420   PLT 58 (L) 09/23/2019 0420   MCV 87.6 09/23/2019 0420   MCH 29.1 09/23/2019 0420   MCHC 33.2 09/23/2019 0420   RDW 15.6 (H) 09/23/2019 0420   LYMPHSABS 3.5 08/26/2019 0500   MONOABS 2.2 (H) 08/26/2019 0500   EOSABS 0.4 08/26/2019 0500   BASOSABS 0.1 08/26/2019 0500    Assessment/Plan:  1. AKI requiring IHD since 08/17/19 until present time at [facility] on TTS schedule, however his BUN/Cr have continued to improve without HD.   1. No indication for dialysis at this time and will need to arrange for follow up after discharge 2. Will also refer to have his HD cathter removed as an outpatient. 2. AMS- due to acute hepatic encephalopathy- improving with lactulose 3. Right eye swelling- unclear etiology.   4. Acute on chronic anemia- to receive blood transfusion.  History of epistaxis in the past. 5. Cirrhosis- presumably due to etoh  diagnosed during his hospitalization last month.   ?etiology.  Nothing documented other than abnormal LFT's in the past.  Will need further workup.  6. Thrombocytopenia- chronic, cont to follow.  7. Disposition- stable for discharge and will arrange for follow up.   Donetta Potts, MD [employer] [phone]

## 2019-09-23 NOTE — Discharge Summary (Signed)
Physician Discharge Summary  Edward Rocha. [id number] DOB: 1965-11-24 DOA: 09/20/2019  PCP: Neale Burly, MD  Admit date: 09/20/2019  Discharge date: 09/23/2019  Admitted From:Home  Disposition:  Home  Recommendations for Outpatient Follow-up:  1. Follow up with PCP in 1-2 weeks 2. Follow-up with nephrology outpatient as scheduled and have follow-up repeat BMP and magnesium along with intermittent hemodialysis as needed. 3. Continue on potassium and magnesium supplementations as ordered 4. Continue on doxycycline for 7 days for treatment of right-sided periorbital cellulitis 5. Continue lactulose as prescribed 6. Continue on prior home medications with adjustments as noted  Home Health: None  Equipment/Devices: None  Discharge Condition: Stable  CODE STATUS: Full  Diet recommendation: Heart Healthy/carb modified  Brief/Interim Summary: Per HPI: [name] a82 y.o.male,with history of ESRD on dialysis Tuesday Thursday Saturday, thyroid disease, hypertension, hyperlipidemia, diabetes mellitus type 2, and elevated LFTs presents to the ED today via EMS. Patient is angry he does not know why he is here today. He reports that his wife said that he had speech problems and that that is why he is here, but that he did not have speech problems. It is reported that patient'swife reported altered mental status. Asked patient if he had been confused, and patient said no. He reports that he remembers EMS and remembers his wife bring him in here. ER staff reports that he was recently reoriented and did not remember any of that. ER staff also reports that upon arrival patient had incomprehensible speech. Patient is a very poor historian, and cannot say when any of the started. Of note he did miss dialysis today. He reports that he just forgot dialysis reports he has only been getting dialysis for about a week and a half. Dialysis access is in the right chest tunneled cath.  Patient reports that he sometimes becomes fatigued minutes getting close to time to have dialysis, but he is not fatigued now. Patient reports he still makes urine. His last void was this morning. Sometimes he has difficulty as he has the urge to urinate but cannot make urine. Patient is also supposed to be on lactulose. Patient reports no known history of liver disease. He does have a documented history of elevated LFTs. Patient did have a hepatitis panel on August 24, 2019 that was nonreactive for hep B surface antigen core total antibody B E antibody.Patient had a negative HIV screen in July 2020. I do not see hep C study done. Wife reported to ED staff that she had not been giving him his lactulose and he will probably need an ammonia check.  11/22:Patient was admitted with acute metabolic encephalopathy in the setting of elevated ammonia levels. Lactulose has been given and will plan to repeat levels in a.m. and see if condition has improved with decreasing levels, otherwise will consider brain MRI at that time. Discussed with nephrology who will plan to dialyze patient either today or tomorrow. Supplement potassium and magnesium today and recheck levels in a.m. Patient noted to have low hemoglobin levels and will check stool occult as well as anemia panel and recheck CBC in a.m. Continue on SCDs for now just in case.  11/23: Patient has improvement in encephalopathy with ammonia levels that have decreased to 44 today.  We will continue on daily lactulose and follow levels.  Continue to replete potassium and magnesium with plans for further hemodialysis today.  Worsening anemia noted and 1 unit PRBC transfusion planned and will recheck levels in a.m.  11/24: Patient has had significant improvement in hemoglobin levels up to 8.2 today after 1 unit PRBC transfusion.  He has not required any further hemodialysis while here and will follow up with nephrology outpatient as planned.  He  will remain on some magnesium and potassium supplementation as ordered with recheck on levels that will be needed within the week.  He will also be prescribed some lactulose to continue taking to prevent elevation in ammonia levels.  He is noted to have some right-sided facial swelling and periorbital cellulitis for which I will discharge him on oral doxycycline for the next 7 days.  He is eager to go home and is no longer confused or lethargic.  He has had no other acute events during the course of this admission is otherwise stable for discharge with medications as prescribed.  Discharge Diagnoses:  Active Problems:   Altered mental status  Principal discharge diagnosis: Acute hepatic encephalopathy-resolved.  Acute on chronic anemia status post 1 unit PRBC transfusion.  Discharge Instructions  Discharge Instructions    Diet - low sodium heart healthy   Complete by: As directed    Increase activity slowly   Complete by: As directed      Allergies as of 09/23/2019      Reactions   Other    Pt received platelets and had a bad reaction from infusion       Medication List    TAKE these medications   acetaminophen 500 MG tablet Commonly known as: TYLENOL Take 1,000 mg by mouth daily as needed for moderate pain or headache.   atorvastatin 80 MG tablet Commonly known as: LIPITOR Take 80 mg by mouth daily.   Basaglar KwikPen 100 UNIT/ML Sopn Inject 20 Units into the skin every evening.   chlorthalidone 50 MG tablet Commonly known as: HYGROTON Take 50 mg by mouth daily.   clopidogrel 75 MG tablet Commonly known as: PLAVIX Take 75 mg by mouth daily.   diphenhydrAMINE 25 MG tablet Commonly known as: BENADRYL Take 25 mg by mouth daily as needed for itching.   doxycycline 50 MG capsule Commonly known as: VIBRAMYCIN Take 1 capsule (50 mg total) by mouth 2 (two) times daily for 7 days.   gabapentin 300 MG capsule Commonly known as: NEURONTIN Take 300 mg by mouth 3 (three)  times daily.   glipiZIDE 5 MG tablet Commonly known as: GLUCOTROL Take 5 mg by mouth 2 (two) times daily before a meal.   Jardiance 10 MG Tabs tablet Generic drug: empagliflozin Take 10 mg by mouth daily.   labetalol 300 MG tablet Commonly known as: NORMODYNE Take 300 mg by mouth 2 (two) times daily.   lactulose 10 GM/15ML solution Commonly known as: CHRONULAC Take 15 mLs (10 g total) by mouth daily. Start taking on: September 24, 2019   magnesium oxide 400 MG tablet Commonly known as: MAG-OX Take 1 tablet (400 mg total) by mouth daily.   NIFEdipine 30 MG 24 hr tablet Commonly known as: ADALAT CC Take 1 tablet (30 mg total) by mouth daily. Start taking on: September 24, 2019 What changed:   medication strength  how much to take   oxyCODONE-acetaminophen 5-325 MG tablet Commonly known as: PERCOCET/ROXICET Take 1 tablet by mouth every 6 (six) hours as needed. What changed: reasons to take this   potassium chloride 10 MEQ tablet Commonly known as: KLOR-CON Take 1 tablet (10 mEq total) by mouth daily.      Follow-up Information    Stoney Bang  A, MD Follow up in 1 week(s).   Specialty: Internal Medicine Contact information: [address] [place] [id number] [phone]        Donato Heinz, MD Follow up in 1 week(s).   Specialty: Nephrology Contact information: [address] [place] [address] [phone]          Allergies  Allergen Reactions  . Other     Pt received platelets and had a bad reaction from infusion     Consultations:  Nephrology   Procedures/Studies: X-[place] Chest Pa Or Ap  Result Date: 09/09/2019 CLINICAL DATA:  Status post insertion of dialysis catheter. EXAM: CHEST  1 VIEW COMPARISON:  Chest x-[place] dated 08/28/2019 FINDINGS: There is a new double lumen central venous dialysis catheter in place. The tip is at the cavoatrial junction in good position. No pneumothorax. Heart size and pulmonary vascularity are  normal. Aortic atherosclerosis. No acute bone abnormality. Old healed right rib fractures IMPRESSION: 1. Satisfactory appearance of the chest after dialysis catheter insertion. No pneumothorax. 2. Aortic atherosclerosis. Electronically Signed   By: Lorriane Shire M.D.   On: 09/09/2019 09:45   Ct Head Wo Contrast  Result Date: 09/20/2019 CLINICAL DATA:  Encephalopathy confusion EXAM: CT HEAD WITHOUT CONTRAST TECHNIQUE: Contiguous axial images were obtained from the base of the skull through the vertex without intravenous contrast. COMPARISON:  CT brain 08/24/2019 FINDINGS: Brain: No acute territorial infarction, hemorrhage, or intracranial mass. Mild atrophy. Mild hypodensity in the white matter consistent with small vessel ischemic change. Stable ventricle size Vascular: No hyperdense vessels.  Carotid vascular calcification Skull: Normal. Negative for fracture or focal lesion. Sinuses/Orbits: Mucosal thickening in the sinuses Other: None. IMPRESSION: 1. No CT evidence for acute intracranial abnormality. 2. Atrophy and mild small vessel ischemic changes of the white matter Electronically Signed   By: Donavan Foil M.D.   On: 09/20/2019 19:16   Ct Head Wo Contrast  Result Date: 08/24/2019 CLINICAL DATA:  Encephalopathy.  Seizure like activity EXAM: CT HEAD WITHOUT CONTRAST TECHNIQUE: Contiguous axial images were obtained from the base of the skull through the vertex without intravenous contrast. COMPARISON:  None. FINDINGS: Brain: No evidence of acute infarction, hemorrhage, hydrocephalus, extra-axial collection or mass lesion/mass effect. Mild atrophy and mild white matter changes most likely chronic microvascular ischemia. Vascular: Negative for hyperdense vessel Skull: Negative Sinuses/Orbits: Patient is intubated. Mild mucosal edema paranasal sinuses. Negative orbit Other: None IMPRESSION: No acute intracranial abnormality. Electronically Signed   By: Franchot Gallo M.D.   On: 08/24/2019 11:55   Dg  Chest Portable 1 View  Result Date: 09/20/2019 CLINICAL DATA:  AMS, pt missed renal dialysis today, pt restless and unable to follow commands, held back flat for image EXAM: PORTABLE CHEST 1 VIEW COMPARISON:  09/09/2019 FINDINGS: Cardiac silhouette borderline enlarged. No mediastinal or hilar masses. Clear lungs.  No pleural effusion or pneumothorax. Right internal jugular dual-lumen tunneled central venous catheter is stable. Skeletal structures are grossly intact. IMPRESSION: No acute cardiopulmonary disease. Electronically Signed   By: Lajean Manes M.D.   On: 09/20/2019 17:59   Dg Chest Port 1 View  Result Date: 08/28/2019 CLINICAL DATA:  Acute respiratory failure EXAM: PORTABLE CHEST 1 VIEW COMPARISON:  August 24, 2019 FINDINGS: The mediastinal contour and cardiac silhouette are stable. Right central venous line is identified distal tip in the superior vena cava, unchanged. There is no focal infiltrate, pulmonary edema, or pleural effusion. Bony structures are stable. IMPRESSION: No acute cardiopulmonary disease. Electronically Signed   By:  Abelardo Diesel M.D.   On: 08/28/2019 07:21   Dg Addison Bailey G Tube Plc W/fl W/rad  Result Date: 08/24/2019 CLINICAL DATA:  OG tube placement requested on intubated patient. EXAM: NASO G TUBE PLACEMENT WITH FL AND WITH RAD CONTRAST:  10 cc Omnipaque iodinated contrast administered via OG-tube. FLUOROSCOPY TIME:  Fluoroscopy Time:  4 minutes 24 seconds Radiation Exposure Index (if provided by the fluoroscopic device): 64.5 mGy Number of Acquired Spot Images: 0 COMPARISON:  08/22/2019 abdominal radiograph FINDINGS: A 12 Fr OG tube was successfully advanced into the distal stomach under fluoroscopic guidance with the tip verified in the region of the pylorus. IMPRESSION: Successful OG tube placement into the distal stomach under fluoroscopic guidance. Tip verified in the region of the pylorus. Tube ready for use. Electronically Signed   By: Ilona Sorrel M.D.   On:  08/24/2019 16:03   Dg Fluoro Guide Cv Line-no Report  Result Date: 09/09/2019 Fluoroscopy was utilized by the requesting physician.  No radiographic interpretation.     Discharge Exam: Vitals:   09/22/19 2151 09/23/19 0442  BP: (!) 107/44 (!) 123/54  Pulse: (!) 101 98  Resp: 18 18  Temp: 99.5 F (37.5 C) 99 F (37.2 C)  SpO2: 96% 97%   Vitals:   09/22/19 1647 09/22/19 2151 09/23/19 0442 09/23/19 0500  BP: 116/62 (!) 107/44 (!) 123/54   Pulse: 95 (!) 101 98   Resp: 19 18 18    Temp: 98.2 F (36.8 C) 99.5 F (37.5 C) 99 F (37.2 C)   TempSrc: Oral Oral Oral   SpO2: 100% 96% 97%   Weight:    90.1 kg  Height:        General: Pt is alert, awake, not in acute distress Cardiovascular: RRR, S1/S2 +, no rubs, no gallops Respiratory: CTA bilaterally, no wheezing, no rhonchi Abdominal: Soft, NT, ND, bowel sounds + Extremities: no edema, no cyanosis Right sided facial swelling and mild periorbital edema noted.  Minimal erythema and tenderness.    The results of significant diagnostics from this hospitalization (including imaging, microbiology, ancillary and laboratory) are listed below for reference.     Microbiology: Recent Results (from the past 240 hour(s))  SARS CORONAVIRUS 2 (TAT 6-24 HRS) Nasopharyngeal Nasopharyngeal Swab     Status: None   Collection Time: 09/20/19  8:13 PM   Specimen: Nasopharyngeal Swab  Result Value Ref Range Status   SARS Coronavirus 2 NEGATIVE NEGATIVE Final    Comment: (NOTE) SARS-CoV-2 target nucleic acids are NOT DETECTED. The SARS-CoV-2 RNA is generally detectable in upper and lower respiratory specimens during the acute phase of infection. Negative results do not preclude SARS-CoV-2 infection, do not rule out co-infections with other pathogens, and should not be used as the sole basis for treatment or other patient management decisions. Negative results must be combined with clinical observations, patient history, and epidemiological  information. The expected result is Negative. Fact Sheet for Patients: SugarRoll.be Fact Sheet for Healthcare Providers: https://www.woods-mathews.com/ This test is not yet approved or cleared by the [place] FDA and  has been authorized for detection and/or diagnosis of SARS-CoV-2 by FDA under an Emergency Use Authorization (EUA). This EUA will remain  in effect (meaning this test can be used) for the duration of the COVID-19 declaration under Section 56 4(b)(1) of the Act, 21 U.S.C. section 360bbb-3(b)(1), unless the authorization is terminated or revoked sooner. Performed at [facility], [place] [address] [place], [place] [address]      Labs: BNP (  last 3 results) No results for input(s): BNP in the last 8760 hours. Basic Metabolic Panel: Recent Labs  Lab 09/20/19 1744 09/21/19 0702 09/22/19 0647 09/23/19 0420  NA 133* 135 135 134*  K 3.8 3.3* 3.1* 3.2*  CL 100 104 103 104  CO2 24 23 23 22   GLUCOSE 123* 116* 74 80  BUN 25* 24* 24* 21*  CREATININE 2.36* 1.63* 1.44* 1.29*  CALCIUM 8.3* 8.2* 7.8* 7.8*  MG  --  1.5* 1.5* 1.5*  PHOS  --  5.0* 4.4  --    Liver Function Tests: Recent Labs  Lab 09/20/19 1744 09/21/19 0702  AST 41 34  ALT 22 19  ALKPHOS 78 69  BILITOT 1.9* 1.8*  PROT 6.9 6.0*  ALBUMIN 2.2* 1.9*   No results for input(s): LIPASE, AMYLASE in the last 168 hours. Recent Labs  Lab 09/20/19 1838 09/22/19 0647 09/23/19 0420  AMMONIA 53* 44* 51*   CBC: Recent Labs  Lab 09/20/19 1744 09/21/19 0702 09/22/19 0647 09/23/19 0420  WBC 4.7 4.7 4.4 4.7  HGB 8.0* 7.4* 6.8* 8.2*  HCT 25.3* 23.4* 21.5* 24.7*  MCV 88.2 87.6 88.5 87.6  PLT 63* 59* 57* 58*   Cardiac Enzymes: No results for input(s): CKTOTAL, CKMB, CKMBINDEX, TROPONINI in the last 168 hours. BNP: Invalid input(s): POCBNP CBG: Recent Labs  Lab 09/22/19 0716 09/22/19 1101 09/22/19 1552 09/22/19 2151 09/23/19 0723  GLUCAP 67* 152*  130* 132* 83   D-Dimer No results for input(s): DDIMER in the last 72 hours. Hgb A1c No results for input(s): HGBA1C in the last 72 hours. Lipid Profile No results for input(s): CHOL, HDL, LDLCALC, TRIG, CHOLHDL, LDLDIRECT in the last 72 hours. Thyroid function studies Recent Labs    09/21/19 0702  TSH 1.691   Anemia work up Recent Labs    09/21/19 0702  VITAMINB12 2,017*  FOLATE 21.6  FERRITIN 162  TIBC 253  IRON 84  RETICCTPCT 4.5*   Urinalysis    Component Value Date/Time   COLORURINE AMBER (A) 08/13/2019 1309   APPEARANCEUR CLOUDY (A) 08/13/2019 1309   LABSPEC 1.014 08/13/2019 1309   PHURINE 5.0 08/13/2019 1309   GLUCOSEU >=500 (A) 08/13/2019 1309   HGBUR LARGE (A) 08/13/2019 1309   BILIRUBINUR NEGATIVE 08/13/2019 1309   KETONESUR NEGATIVE 08/13/2019 1309   PROTEINUR 100 (A) 08/13/2019 1309   NITRITE NEGATIVE 08/13/2019 1309   LEUKOCYTESUR NEGATIVE 08/13/2019 1309   Sepsis Labs Invalid input(s): PROCALCITONIN,  WBC,  LACTICIDVEN Microbiology Recent Results (from the past 240 hour(s))  SARS CORONAVIRUS 2 (TAT 6-24 HRS) Nasopharyngeal Nasopharyngeal Swab     Status: None   Collection Time: 09/20/19  8:13 PM   Specimen: Nasopharyngeal Swab  Result Value Ref Range Status   SARS Coronavirus 2 NEGATIVE NEGATIVE Final    Comment: (NOTE) SARS-CoV-2 target nucleic acids are NOT DETECTED. The SARS-CoV-2 RNA is generally detectable in upper and lower respiratory specimens during the acute phase of infection. Negative results do not preclude SARS-CoV-2 infection, do not rule out co-infections with other pathogens, and should not be used as the sole basis for treatment or other patient management decisions. Negative results must be combined with clinical observations, patient history, and epidemiological information. The expected result is Negative. Fact Sheet for Patients: SugarRoll.be Fact Sheet for Healthcare  Providers: https://www.woods-mathews.com/ This test is not yet approved or cleared by the [place] FDA and  has been authorized for detection and/or diagnosis of SARS-CoV-2 by FDA under an Emergency Use Authorization (EUA). This EUA  will remain  in effect (meaning this test can be used) for the duration of the COVID-19 declaration under Section 56 4(b)(1) of the Act, 21 U.S.C. section 360bbb-3(b)(1), unless the authorization is terminated or revoked sooner. Performed at [facility], [place] [address] [place], [place] [address]      Time coordinating discharge: 40 minutes  SIGNED:   Rodena Goldmann, DO [facility] 09/23/2019, 9:01 AM  If 7PM-7AM, please contact night-coverage [email or web address]

## 2019-09-23 NOTE — TOC Transition Note (Addendum)
Transition of Care [facility]) - CM/SW Discharge Note   Patient Details  Name: Edward Rocha. MRN: [id number] Date of Birth: 1966/06/10  Transition of Care [facility]) CM/SW Contact:  Chaise Mahabir, Chauncey Reading, RN Phone Number: 09/23/2019, 10:42 AM   Clinical Narrative:  Patient discharging today. Recently started OP PT due to no home health availability/insurance issues.  F/u appt. Scheduled.  Reports no issues with transportation or medications.       Barriers to Discharge: Continued Medical Work up   Patient Goals and CMS Choice Patient states their goals for this hospitalization and ongoing recovery are:: return home CMS Medicare.gov Compare Post Acute Care list provided to:: Patient         Discharge Plan and Services   Discharge Planning Services: CM Consult Post Acute Care Choice: Home Health, Resumption of Svcs/PTA Provider                               Social Determinants of Health (SDOH) Interventions     Readmission Risk Interventions Readmission Risk Prevention Plan 09/23/2019  Transportation Screening Complete  PCP or Specialist Appt within 3-5 Days Complete  HRI or [place] Not Complete  HRI or Home Care Consult comments doing OP PT  Social Work Consult for [place] Planning/Counseling Complete  Palliative Care Screening Not Applicable  Medication Review [employer] photographer) Complete  Some recent data might be hidden

## 2019-09-29 ENCOUNTER — Other Ambulatory Visit: Payer: Self-pay

## 2019-09-29 ENCOUNTER — Telehealth (HOSPITAL_COMMUNITY): Payer: Self-pay

## 2019-09-29 DIAGNOSIS — I70229 Atherosclerosis of native arteries of extremities with rest pain, unspecified extremity: Secondary | ICD-10-CM

## 2019-09-29 DIAGNOSIS — I998 Other disorder of circulatory system: Secondary | ICD-10-CM

## 2019-09-29 NOTE — Telephone Encounter (Signed)

## 2019-09-30 ENCOUNTER — Encounter (HOSPITAL_COMMUNITY): Payer: Managed Care, Other (non HMO)

## 2019-09-30 ENCOUNTER — Encounter: Payer: Managed Care, Other (non HMO) | Admitting: Vascular Surgery

## 2019-10-18 ENCOUNTER — Observation Stay (HOSPITAL_COMMUNITY)
Admission: EM | Admit: 2019-10-18 | Discharge: 2019-10-19 | Disposition: A | Discharge status: Home / Self Care | Payer: Managed Care, Other (non HMO) | Attending: Family Medicine | Admitting: Family Medicine

## 2019-10-18 ENCOUNTER — Emergency Department (HOSPITAL_COMMUNITY): Payer: Managed Care, Other (non HMO)

## 2019-10-18 ENCOUNTER — Encounter (HOSPITAL_COMMUNITY): Payer: Self-pay | Admitting: Emergency Medicine

## 2019-10-18 ENCOUNTER — Other Ambulatory Visit: Payer: Self-pay

## 2019-10-18 ENCOUNTER — Inpatient Hospital Stay (HOSPITAL_COMMUNITY): Payer: Managed Care, Other (non HMO)

## 2019-10-18 DIAGNOSIS — I5031 Acute diastolic (congestive) heart failure: Secondary | ICD-10-CM | POA: Diagnosis present

## 2019-10-18 DIAGNOSIS — I110 Hypertensive heart disease with heart failure: Secondary | ICD-10-CM | POA: Insufficient documentation

## 2019-10-18 DIAGNOSIS — J810 Acute pulmonary edema: Secondary | ICD-10-CM | POA: Insufficient documentation

## 2019-10-18 DIAGNOSIS — I2699 Other pulmonary embolism without acute cor pulmonale: Secondary | ICD-10-CM

## 2019-10-18 DIAGNOSIS — I509 Heart failure, unspecified: Secondary | ICD-10-CM | POA: Insufficient documentation

## 2019-10-18 DIAGNOSIS — E1129 Type 2 diabetes mellitus with other diabetic kidney complication: Secondary | ICD-10-CM

## 2019-10-18 DIAGNOSIS — T783XXA Angioneurotic edema, initial encounter: Principal | ICD-10-CM | POA: Insufficient documentation

## 2019-10-18 DIAGNOSIS — Z79899 Other long term (current) drug therapy: Secondary | ICD-10-CM | POA: Insufficient documentation

## 2019-10-18 DIAGNOSIS — E119 Type 2 diabetes mellitus without complications: Secondary | ICD-10-CM | POA: Insufficient documentation

## 2019-10-18 DIAGNOSIS — E1159 Type 2 diabetes mellitus with other circulatory complications: Secondary | ICD-10-CM

## 2019-10-18 DIAGNOSIS — Z20828 Contact with and (suspected) exposure to other viral communicable diseases: Secondary | ICD-10-CM | POA: Insufficient documentation

## 2019-10-18 DIAGNOSIS — Z794 Long term (current) use of insulin: Secondary | ICD-10-CM

## 2019-10-18 DIAGNOSIS — I739 Peripheral vascular disease, unspecified: Secondary | ICD-10-CM | POA: Diagnosis present

## 2019-10-18 DIAGNOSIS — K7460 Unspecified cirrhosis of liver: Secondary | ICD-10-CM | POA: Insufficient documentation

## 2019-10-18 DIAGNOSIS — Z87891 Personal history of nicotine dependence: Secondary | ICD-10-CM | POA: Insufficient documentation

## 2019-10-18 DIAGNOSIS — I361 Nonrheumatic tricuspid (valve) insufficiency: Secondary | ICD-10-CM

## 2019-10-18 DIAGNOSIS — E1169 Type 2 diabetes mellitus with other specified complication: Secondary | ICD-10-CM

## 2019-10-18 LAB — CBC
HCT: 29.9 % — ABNORMAL LOW (ref 39.0–52.0)
Hemoglobin: 9.7 g/dL — ABNORMAL LOW (ref 13.0–17.0)
MCH: 28 pg (ref 26.0–34.0)
MCHC: 32.4 g/dL (ref 30.0–36.0)
MCV: 86.4 fL (ref 80.0–100.0)
Platelets: 77 10*3/uL — ABNORMAL LOW (ref 150–400)
RBC: 3.46 MIL/uL — ABNORMAL LOW (ref 4.22–5.81)
RDW: 15 % (ref 11.5–15.5)
WBC: 10.8 10*3/uL — ABNORMAL HIGH (ref 4.0–10.5)
nRBC: 0 % (ref 0.0–0.2)

## 2019-10-18 LAB — ECHOCARDIOGRAM COMPLETE
Height: 73 in
Weight: 3520 oz

## 2019-10-18 LAB — HEMOGLOBIN A1C
Hgb A1c MFr Bld: 5.3 % (ref 4.8–5.6)
Mean Plasma Glucose: 105.41 mg/dL

## 2019-10-18 LAB — BASIC METABOLIC PANEL
Anion gap: 7 (ref 5–15)
BUN: 16 mg/dL (ref 6–20)
CO2: 22 mmol/L (ref 22–32)
Calcium: 8.1 mg/dL — ABNORMAL LOW (ref 8.9–10.3)
Chloride: 102 mmol/L (ref 98–111)
Creatinine, Ser: 1.03 mg/dL (ref 0.61–1.24)
GFR calc Af Amer: >60 mL/min (ref >60)
GFR calc non Af Amer: >60 mL/min (ref >60)
Glucose, Bld: 219 mg/dL — ABNORMAL HIGH (ref 70–99)
Potassium: 3.1 mmol/L — ABNORMAL LOW (ref 3.5–5.1)
Sodium: 131 mmol/L — ABNORMAL LOW (ref 135–145)

## 2019-10-18 LAB — TROPONIN I (HIGH SENSITIVITY)
Troponin I (High Sensitivity): 9 ng/L (ref <18)
Troponin I (High Sensitivity): 8 ng/L (ref <18)

## 2019-10-18 LAB — BRAIN NATRIURETIC PEPTIDE: B Natriuretic Peptide: 448 pg/mL — ABNORMAL HIGH (ref 0.0–100.0)

## 2019-10-18 LAB — GLUCOSE, CAPILLARY: Glucose-Capillary: 181 mg/dL — ABNORMAL HIGH (ref 70–99)

## 2019-10-18 LAB — CBG MONITORING, ED
Glucose-Capillary: 279 mg/dL — ABNORMAL HIGH (ref 70–99)
Glucose-Capillary: 220 mg/dL — ABNORMAL HIGH (ref 70–99)

## 2019-10-18 MED ORDER — NIFEDIPINE ER OSMOTIC RELEASE 30 MG PO TB24
30.0000 mg | ORAL_TABLET | Freq: Every day | ORAL | Status: DC
  Administered 2019-10-18 – 2019-10-19 (×2): 30 mg via ORAL
  Filled 2019-10-18 (×2): qty 1

## 2019-10-18 MED ORDER — POLYETHYLENE GLYCOL 3350 17 G PO PACK: 17.0000 g | PACK | Freq: Every day | ORAL | Status: DC | PRN

## 2019-10-18 MED ORDER — FUROSEMIDE 10 MG/ML IJ SOLN
40.0000 mg | Freq: Once | INTRAMUSCULAR | Status: AC
  Administered 2019-10-18: 40 mg via INTRAVENOUS
  Filled 2019-10-18: qty 4

## 2019-10-18 MED ORDER — ONDANSETRON HCL 4 MG PO TABS: 4.0000 mg | ORAL_TABLET | Freq: Four times a day (QID) | ORAL | Status: DC | PRN

## 2019-10-18 MED ORDER — SODIUM CHLORIDE 0.9% FLUSH: 3.0000 mL | INTRAVENOUS | Status: DC | PRN

## 2019-10-18 MED ORDER — ATORVASTATIN CALCIUM 40 MG PO TABS
80.0000 mg | ORAL_TABLET | Freq: Every day | ORAL | Status: DC
  Administered 2019-10-18 – 2019-10-19 (×2): 80 mg via ORAL
  Filled 2019-10-18 (×2): qty 2

## 2019-10-18 MED ORDER — DIPHENHYDRAMINE HCL 25 MG PO CAPS: 25.0000 mg | ORAL_CAPSULE | Freq: Every day | ORAL | Status: DC | PRN

## 2019-10-18 MED ORDER — PREDNISONE 20 MG PO TABS
50.0000 mg | ORAL_TABLET | Freq: Every day | ORAL | Status: DC
  Administered 2019-10-18 – 2019-10-19 (×2): 50 mg via ORAL
  Filled 2019-10-18 (×3): qty 1

## 2019-10-18 MED ORDER — POTASSIUM CHLORIDE CRYS ER 20 MEQ PO TBCR
10.0000 meq | EXTENDED_RELEASE_TABLET | Freq: Every day | ORAL | Status: DC
  Administered 2019-10-19: 10 meq via ORAL
  Filled 2019-10-18 (×2): qty 1

## 2019-10-18 MED ORDER — SODIUM CHLORIDE 0.9 % IV SOLN: 250.0000 mL | INTRAVENOUS | Status: DC | PRN

## 2019-10-18 MED ORDER — FUROSEMIDE 10 MG/ML IJ SOLN
40.0000 mg | Freq: Two times a day (BID) | INTRAMUSCULAR | Status: DC
  Administered 2019-10-18 – 2019-10-19 (×2): 40 mg via INTRAVENOUS
  Filled 2019-10-18 (×2): qty 4

## 2019-10-18 MED ORDER — POTASSIUM CHLORIDE CRYS ER 20 MEQ PO TBCR
40.0000 meq | EXTENDED_RELEASE_TABLET | ORAL | Status: AC
  Administered 2019-10-18 (×2): 40 meq via ORAL
  Filled 2019-10-18 (×2): qty 2

## 2019-10-18 MED ORDER — ACETAMINOPHEN 650 MG RE SUPP: 650.0000 mg | Freq: Four times a day (QID) | RECTAL | Status: DC | PRN

## 2019-10-18 MED ORDER — ONDANSETRON HCL 4 MG/2ML IJ SOLN: 4.0000 mg | Freq: Four times a day (QID) | INTRAMUSCULAR | Status: DC | PRN

## 2019-10-18 MED ORDER — ACETAMINOPHEN 325 MG PO TABS: 650.0000 mg | ORAL_TABLET | Freq: Four times a day (QID) | ORAL | Status: DC | PRN

## 2019-10-18 MED ORDER — ALBUTEROL SULFATE (2.5 MG/3ML) 0.083% IN NEBU: 2.5000 mg | INHALATION_SOLUTION | RESPIRATORY_TRACT | Status: DC | PRN

## 2019-10-18 MED ORDER — ACETAMINOPHEN 500 MG PO TABS: 1000.0000 mg | ORAL_TABLET | Freq: Every day | ORAL | Status: DC | PRN

## 2019-10-18 MED ORDER — INSULIN ASPART 100 UNIT/ML ~~LOC~~ SOLN
0.0000 [IU] | Freq: Three times a day (TID) | SUBCUTANEOUS | Status: DC
  Administered 2019-10-18 (×2): 8 [IU] via SUBCUTANEOUS
  Administered 2019-10-19 (×2): 5 [IU] via SUBCUTANEOUS
  Filled 2019-10-18 (×2): qty 1

## 2019-10-18 MED ORDER — LACTULOSE 10 GM/15ML PO SOLN
10.0000 g | Freq: Every day | ORAL | Status: DC
  Administered 2019-10-18 – 2019-10-19 (×2): 10 g via ORAL
  Filled 2019-10-18 (×2): qty 30

## 2019-10-18 MED ORDER — SODIUM CHLORIDE 0.9% FLUSH
3.0000 mL | Freq: Two times a day (BID) | INTRAVENOUS | Status: DC
  Administered 2019-10-18 – 2019-10-19 (×3): 3 mL via INTRAVENOUS

## 2019-10-18 MED ORDER — LABETALOL HCL 200 MG PO TABS
300.0000 mg | ORAL_TABLET | Freq: Two times a day (BID) | ORAL | Status: DC
  Administered 2019-10-18 – 2019-10-19 (×3): 300 mg via ORAL
  Filled 2019-10-18 (×3): qty 2

## 2019-10-18 MED ORDER — DIPHENHYDRAMINE HCL 25 MG PO CAPS
25.0000 mg | ORAL_CAPSULE | Freq: Every day | ORAL | Status: DC
  Administered 2019-10-18: 25 mg via ORAL
  Filled 2019-10-18: qty 1

## 2019-10-18 MED ORDER — CLOPIDOGREL BISULFATE 75 MG PO TABS
75.0000 mg | ORAL_TABLET | Freq: Every day | ORAL | Status: DC
  Administered 2019-10-18 – 2019-10-19 (×2): 75 mg via ORAL
  Filled 2019-10-18 (×2): qty 1

## 2019-10-18 MED ORDER — GABAPENTIN 300 MG PO CAPS
300.0000 mg | ORAL_CAPSULE | Freq: Three times a day (TID) | ORAL | Status: DC
  Administered 2019-10-18 – 2019-10-19 (×3): 300 mg via ORAL
  Filled 2019-10-18 (×3): qty 1

## 2019-10-18 MED ORDER — GLIPIZIDE 5 MG PO TABS: 5.0000 mg | ORAL_TABLET | Freq: Two times a day (BID) | ORAL | Status: DC

## 2019-10-18 MED ORDER — HEPARIN SODIUM (PORCINE) 5000 UNIT/ML IJ SOLN: 5000.0000 [IU] | Freq: Three times a day (TID) | INTRAMUSCULAR | Status: DC

## 2019-10-18 MED ORDER — FAMOTIDINE 20 MG PO TABS
20.0000 mg | ORAL_TABLET | Freq: Two times a day (BID) | ORAL | Status: DC
  Administered 2019-10-18 – 2019-10-19 (×3): 20 mg via ORAL
  Filled 2019-10-18 (×3): qty 1

## 2019-10-18 MED ORDER — TRAZODONE HCL 50 MG PO TABS
50.0000 mg | ORAL_TABLET | Freq: Every evening | ORAL | Status: DC | PRN
  Administered 2019-10-18: 50 mg via ORAL
  Filled 2019-10-18: qty 1

## 2019-10-18 MED ORDER — INSULIN ASPART 100 UNIT/ML ~~LOC~~ SOLN: 0.0000 [IU] | Freq: Every day | SUBCUTANEOUS | Status: DC

## 2019-10-18 MED ORDER — MAGNESIUM OXIDE 400 MG PO TABS
400.0000 mg | ORAL_TABLET | Freq: Every day | ORAL | Status: DC
  Administered 2019-10-18 – 2019-10-19 (×2): 400 mg via ORAL
  Filled 2019-10-18 (×4): qty 1

## 2019-10-18 MED ORDER — INSULIN ASPART 100 UNIT/ML ~~LOC~~ SOLN
3.0000 [IU] | Freq: Three times a day (TID) | SUBCUTANEOUS | Status: DC
  Administered 2019-10-18: 3 [IU] via SUBCUTANEOUS

## 2019-10-18 MED ORDER — INSULIN GLARGINE 100 UNIT/ML ~~LOC~~ SOLN
20.0000 [IU] | Freq: Every evening | SUBCUTANEOUS | Status: DC
  Administered 2019-10-18: 20 [IU] via SUBCUTANEOUS
  Filled 2019-10-18 (×2): qty 0.2

## 2019-10-18 NOTE — ED Provider Notes (Addendum)
[facility] EMERGENCY DEPARTMENT Provider Note   CSN: [id number] Arrival date & time: 10/18/19  0038     History Chief Complaint  Patient presents with  . Allergic Reaction    Edward Rocha. is a 53 y.o. male.  Patient presents to the emergency department for evaluation of likely allergic reaction.  Patient reports that he had swelling of his tongue started earlier today.  This has been persistent throughout the day and then this afternoon started having trouble breathing.  He comes to the ER by EMS.  EMS report that they identified significant angioedema, administered epi x2.  Patient given IV Solu-Medrol.  He had taken to Benadryl tablets prior to their arrival.  Patient reports no improvement with treatment.        Past Medical History:  Diagnosis Date  . Acute renal failure (ARF) ([place])    12/2018 when admitted for hyperosmolar hyperglycemic nonketotic syndrome  . Bilateral pain of leg and foot   . CHF (congestive heart failure) ([place])   . Coronary artery disease   . Diabetes mellitus without complication ([place])    Type II  . Dizziness   . Elevated LFTs 05/2019  . Frequent urination   . GERD (gastroesophageal reflux disease)   . Hyperglycemia    Non-Ketotic Hyperosmolar.  . Hyperlipidemia   . Hypernatremia   . Hypertension   . Peripheral vascular disease ([place])   . Syncope    12/2018, diagnosed with hyperosmolar hyperglycemic nonketotic syndrome, AKI, hyponatremia ([facility])  . Thrombocytopenia ([place])   . Thyroid disease    Hypothyroidism  . Weakness     Patient Active Problem List   Diagnosis Date Noted  . Altered mental status 09/21/2019  . Pressure injury of skin 08/27/2019  . Acute respiratory failure ([place])   . Aspiration into airway   . Acute encephalopathy   . Gastrointestinal hemorrhage   . Endotracheal tube present   . Nasogastric tube present   . PAD (peripheral artery disease) ([place]) 08/11/2019  . Thrombocytopenia ([place]) 05/29/2019  .  Diabetes ([place]) 05/29/2019  . Critical lower limb ischemia 04/22/2019    Past Surgical History:  Procedure Laterality Date  . ABDOMINAL AORTOGRAM W/LOWER EXTREMITY N/A 02/06/2019   Procedure: ABDOMINAL AORTOGRAM W/LOWER EXTREMITY;  Surgeon: [name], MD;  Location: [place] CV LAB;  Service: Cardiovascular;  Laterality: N/A;  bilateral  . ABDOMINAL AORTOGRAM W/LOWER EXTREMITY N/A 04/30/2019   Procedure: ABDOMINAL AORTOGRAM W/LOWER EXTREMITY;  Surgeon: [name], MD;  Location: [place] CV LAB;  Service: Cardiovascular;  Laterality: N/A;  . AORTOGRAM  08/11/2019   Procedure: Aortogram;  Surgeon: [name], MD;  Location: [place];  Service: Vascular;;  . ESOPHAGEAL BANDING  08/22/2019   Procedure: ESOPHAGEAL BANDING;  Surgeon: Milus Banister, MD;  Location: [facility] ENDOSCOPY;  Service: Endoscopy;;  . ESOPHAGOGASTRODUODENOSCOPY N/A 08/22/2019   Procedure: ESOPHAGOGASTRODUODENOSCOPY (EGD);  Surgeon: Milus Banister, MD;  Location: [facility] ENDOSCOPY;  Service: Endoscopy;  Laterality: N/A;  . FEMORAL-POPLITEAL BYPASS GRAFT Left 08/11/2019   Procedure: BYPASS LEFT FEMORAL-DISTAL POPLITEAL ARTERY USING PROPATEN GRAFT;  Surgeon: [name], MD;  Location: [place];  Service: Vascular;  Laterality: Left;  . INSERTION OF DIALYSIS CATHETER Right 09/09/2019   Procedure: INSERTION OF DIALYSIS CATHETER;  Surgeon: Waynetta Sandy, MD;  Location: [place];  Service: Vascular;  Laterality: Right;  . MULTIPLE TOOTH EXTRACTIONS    . PERIPHERAL VASCULAR INTERVENTION  02/06/2019   Procedure: PERIPHERAL VASCULAR INTERVENTION;  Surgeon: Monica Martinez  J, MD;  Location: [place] CV LAB;  Service: Cardiovascular;;  Bilateral Iliacs  . PERIPHERAL VASCULAR INTERVENTION  04/30/2019   Procedure: PERIPHERAL VASCULAR INTERVENTION;  Surgeon: [name], MD;  Location: [place] CV LAB;  Service: Cardiovascular;;  Stent - Lt. Iliac   . REMOVAL OF A DIALYSIS CATHETER  Right 09/09/2019   Procedure: Removal Of A Dialysis Catheter;  Surgeon: Waynetta Sandy, MD;  Location: [place];  Service: Vascular;  Laterality: Right;  . ULTRASOUND GUIDANCE FOR VASCULAR ACCESS Right 09/09/2019   Procedure: Ultrasound Guidance For Vascular Access;  Surgeon: Waynetta Sandy, MD;  Location: [place];  Service: Vascular;  Laterality: Right;  Marland Kitchen VASCULAR SURGERY         Family History  Problem Relation Age of Onset  . Alcohol abuse Father   . Cancer Mother   . Alcohol abuse Brother   . Bipolar disorder Son   . Bipolar disorder Daughter     Social History   Tobacco Use  . Smoking status: Former Smoker    Packs/day: 1.00    Years: 36.00    Pack years: 36.00    Types: Cigarettes    Quit date: 01/25/2019    Years since quitting: 0.7  . Smokeless tobacco: Never Used  Substance Use Topics  . Alcohol use: Not Currently    Comment: per pt's wife, pt is an alcoholic but just recently stopped drinking in 04/2019  . Drug use: Never    Home Medications Prior to Admission medications   Medication Sig Start Date End Date Taking? Authorizing Provider  acetaminophen (TYLENOL) 500 MG tablet Take 1,000 mg by mouth daily as needed for moderate pain or headache.    [provider]  atorvastatin (LIPITOR) 80 MG tablet Take 80 mg by mouth daily.  04/16/19   [provider]  chlorthalidone (HYGROTON) 50 MG tablet Take 50 mg by mouth daily.    [provider]  clopidogrel (PLAVIX) 75 MG tablet Take 75 mg by mouth daily.    [provider]  diphenhydrAMINE (BENADRYL) 25 MG tablet Take 25 mg by mouth daily as needed for itching.    [provider]  empagliflozin (JARDIANCE) 10 MG TABS tablet Take 10 mg by mouth daily.    [provider]  gabapentin (NEURONTIN) 300 MG capsule Take 300 mg by mouth 3 (three) times daily.    [provider]  glipiZIDE (GLUCOTROL) 5 MG tablet Take 5 mg by mouth 2 (two) times daily  before a meal.     [provider]  Insulin Glargine (BASAGLAR KWIKPEN) 100 UNIT/ML SOPN Inject 20 Units into the skin every evening.    [provider]  labetalol (NORMODYNE) 300 MG tablet Take 300 mg by mouth 2 (two) times daily.    [provider]  lactulose (CHRONULAC) 10 GM/15ML solution Take 15 mLs (10 g total) by mouth daily. 09/24/19   Manuella Ghazi, Pratik D, DO  magnesium oxide (MAG-OX) 400 MG tablet Take 1 tablet (400 mg total) by mouth daily. 09/23/19 10/23/19  Manuella Ghazi, Pratik D, DO  NIFEdipine (ADALAT CC) 30 MG 24 hr tablet Take 1 tablet (30 mg total) by mouth daily. 09/24/19 10/24/19  Manuella Ghazi, Pratik D, DO  oxyCODONE-acetaminophen (PERCOCET/ROXICET) 5-325 MG tablet Take 1 tablet by mouth every 6 (six) hours as needed. Patient taking differently: Take 1 tablet by mouth every 6 (six) hours as needed for moderate pain.  09/10/19   Ulyses Amor, PA-C  potassium chloride (KLOR-CON) 10  MEQ tablet Take 1 tablet (10 mEq total) by mouth daily. 09/23/19 10/23/19  Heath Lark D, DO    Allergies    Other  Review of Systems   Review of Systems  HENT: Positive for facial swelling.   Respiratory: Positive for shortness of breath.   All other systems reviewed and are negative.   Physical Exam Updated Vital Signs BP (!) 161/78   Pulse (!) 105   Temp 98 F (36.7 C) (Oral)   Resp (!) 26   Ht 6\' 1"  (1.854 m)   Wt 99.8 kg   SpO2 100%   BMI 29.03 kg/m   Physical Exam Vitals and nursing note reviewed.  Constitutional:      General: He is not in acute distress.    Appearance: Normal appearance. He is well-developed.  HENT:     Head: Normocephalic and atraumatic.     Right Ear: Hearing normal.     Left Ear: Hearing normal.     Nose: Nose normal.  Eyes:     Conjunctiva/sclera: Conjunctivae normal.     Pupils: Pupils are equal, round, and reactive to light.  Cardiovascular:     Rate and Rhythm: Regular rhythm. Tachycardia present.     Heart sounds: S1 normal and S2  normal. No murmur. No friction rub. No gallop.   Pulmonary:     Effort: Pulmonary effort is normal. Tachypnea present. No respiratory distress.     Breath sounds: Rhonchi and rales present.  Chest:     Chest wall: No tenderness.  Abdominal:     General: Bowel sounds are normal.     Palpations: Abdomen is soft.     Tenderness: There is no abdominal tenderness. There is no guarding or rebound. Negative signs include Murphy's sign and McBurney's sign.     Hernia: No hernia is present.  Musculoskeletal:        General: Swelling (Right upper extremity) present. Normal range of motion.     Cervical back: Normal range of motion and neck supple.     Right lower leg: Edema present.     Left lower leg: Edema present.  Skin:    General: Skin is warm and dry.     Findings: No rash.  Neurological:     Mental Status: He is alert and oriented to person, place, and time.     GCS: GCS eye subscore is 4. GCS verbal subscore is 5. GCS motor subscore is 6.     Cranial Nerves: No cranial nerve deficit.     Sensory: No sensory deficit.     Coordination: Coordination normal.  Psychiatric:        Speech: Speech normal.        Behavior: Behavior normal.        Thought Content: Thought content normal.     ED Results / Procedures / Treatments   Labs (all labs ordered are listed, but only abnormal results are displayed) Labs Reviewed  CBC - Abnormal; Notable for the following components:      Result Value   WBC 10.8 (*)    RBC 3.46 (*)    Hemoglobin 9.7 (*)    HCT 29.9 (*)    Platelets 77 (*)    All other components within normal limits  BASIC METABOLIC PANEL - Abnormal; Notable for the following components:   Sodium 131 (*)    Potassium 3.1 (*)    Glucose, Bld 219 (*)    Calcium 8.1 (*)    All other  components within normal limits  BRAIN NATRIURETIC PEPTIDE  TROPONIN I (HIGH SENSITIVITY)  TROPONIN I (HIGH SENSITIVITY)    EKG None  Radiology DG Chest Port 1 View  Result Date:  10/18/2019 CLINICAL DATA:  Shortness of breath.  Swelling. EXAM: PORTABLE CHEST 1 VIEW COMPARISON:  Radiograph and CT 10/05/2019 FINDINGS: Right dialysis catheter in the lower SVC. Cardiomegaly. There is vascular congestion with central peribronchial thickening which may represent edema. Bilateral pleural effusions. No pneumothorax. IMPRESSION: Cardiomegaly with vascular congestion and pulmonary edema. Bilateral pleural effusions. Findings consistent with CHF. Electronically Signed   By: Keith Rake M.D.   On: 10/18/2019 01:54    Procedures Procedures (including critical care time)  Medications Ordered in ED Medications  furosemide (LASIX) injection 40 mg (40 mg Intravenous Given 10/18/19 0615)    ED Course  I have reviewed the triage vital signs and the nursing notes.  Pertinent labs & imaging results that were available during my care of the patient were reviewed by me and considered in my medical decision making (see chart for details).    MDM Rules/Calculators/A&P                      Patient brought into the ER by EMS from home for angioedema.  Patient started with swelling early in the morning and this has worsened.  He does have evidence of mild angioedema.  Patient reportedly was on lisinopril in the past but recently was stopped.  Is not clear why.  He developed shortness of breath tonight and that is what prompted him to call EMS.  At arrival he was tachypneic and very dyspneic.  He had increased work of breathing with some rhonchi and rales throughout.  Chest x-Lofstrom suspicious for congestive heart failure.  Lungs do have bilateral rales and he has lower extremity pitting edema I cannot find any previous echo on the patient.  Patient did recently receive dialysis.  This was after receiving IV dye for a vascular procedure.  He was recently hospitalized with acute kidney injury but at discharge his renal function had improved.  His GFR is normal today indicating resolution of his  kidney failure.  He tells me that he is actually scheduled to have his port removed this week as nephrology had anticipated no further need for dialysis.  Patient received epi, Benadryl, Solu-Medrol prior to arrival.  He has mild angioedema.  Oropharyngeal examination reveals patent airway.  No stridor on exam.  Difficulty breathing appears to be secondary to congestive heart failure.  Will initiate on Lasix.    Patient also has a swelling of his right upper extremity.  Etiology of this is unclear.  He does have a port that would put him at risk for DVT.  He reports that this has happened previously, however.  Reviewing records, indeed he had a right upper extremity venous duplex performed in October of this year for swelling that did not show DVT.  May need repeat of the study during hospital stay.    CRITICAL CARE Performed by: Orpah Greek   Total critical care time: 35 minutes  Critical care time was exclusive of separately billable procedures and treating other patients.  Critical care was necessary to treat or prevent imminent or life-threatening deterioration.  Critical care was time spent personally by me on the following activities: development of treatment plan with patient and/or surrogate as well as nursing, discussions with consultants, evaluation of patient's response to treatment, examination of patient, obtaining  history from patient or surrogate, ordering and performing treatments and interventions, ordering and review of laboratory studies, ordering and review of radiographic studies, pulse oximetry and re-evaluation of patient's condition.  Final Clinical Impression(s) / ED Diagnoses Final diagnoses:  Angioedema, initial encounter  Acute pulmonary edema [facility])    Rx / DC Orders ED Discharge Orders    None       Briel Gallicchio, Gwenyth Allegra, MD 10/18/19 [id number]    Orpah Greek, MD 10/18/19 [id number]    Orpah Greek, MD 10/18/19 [phone]

## 2019-10-18 NOTE — ED Notes (Signed)
Pt given breakfast tray

## 2019-10-18 NOTE — ED Triage Notes (Signed)
Pt arrives via EMS for angio edema. Pt received 0.8 epi and 125 solumedrol in route. Pt says that the swelling has been going on since early this week but the SOB started tonight.

## 2019-10-18 NOTE — Progress Notes (Signed)
  Echocardiogram 2D Echocardiogram has been performed.  Edward Rocha 10/18/2019, 9:56 AM

## 2019-10-18 NOTE — ED Notes (Signed)
PT sitting up on the edge of the bed eating breakfast at this time.

## 2019-10-18 NOTE — H&P (Addendum)
Patient Demographics:    Edward Rocha, is a 53 y.o. male  MRN: [id number]   DOB - 1966/01/09  Admit Date - 10/18/2019  Outpatient Primary MD for the patient is Neale Burly, MD   Assessment & Plan:    Principal Problem:   Acute heart failure with preserved ejection fraction (HFpEF) ([place]) Active Problems:   Angioedema   Diabetes ([place])   PAD (peripheral artery disease) ([place])   Cirrhosis of liver ([place])    1)HFpEF--- new onset CHF, echo with EF of 60-65 patient without regional wall motion abnormalities -Troponins are flat -IV Lasix for diuresis as ordered, daily weight and fluid input and output monitoring as ordered  2)DM2-A1c is 5.3 reflecting excellent DM control, avoid over aggressive diabetic control in order to avoid hypoglycemia Use Novolog/Humalog Sliding scale insulin with Accu-Cheks/Fingersticks as ordered   3)Hypokalemia--- potassium is 3.1, replace  4) possible angioedema--- low clinical index of suspicion however patient received epinephrine x2, and the Solu-Medrol and Benadryl PTA, currently stable, continue Pepcid and Benadryl -Unable to identify offending agent -Patient stopped lisinopril apparently over a week ago  5)H/o CAD and PAD--- stable, no ACS type symptoms, continue Lipitor and Plavix,  6)Liver Cirrhosis-no encephalopathy, continue lactulose -Patient with chronic anemia thrombocytopenia in the setting of cirrhosis -No bleeding concerns at this time  7)HTN--stable, continue nifedipine 30 mg daily, labetalol 20 mg twice daily,    With History of - Reviewed by me  Past Medical History:  Diagnosis Date  . Acute renal failure (ARF) ([place])    12/2018 when admitted for hyperosmolar hyperglycemic nonketotic syndrome  . Bilateral pain of leg and foot   . CHF (congestive heart  failure) ([place])   . Coronary artery disease   . Diabetes mellitus without complication ([place])    Type II  . Dizziness   . Elevated LFTs 05/2019  . Frequent urination   . GERD (gastroesophageal reflux disease)   . Hyperglycemia    Non-Ketotic Hyperosmolar.  . Hyperlipidemia   . Hypernatremia   . Hypertension   . Peripheral vascular disease ([place])   . Syncope    12/2018, diagnosed with hyperosmolar hyperglycemic nonketotic syndrome, AKI, hyponatremia ([facility])  . Thrombocytopenia ([place])   . Thyroid disease    Hypothyroidism  . Weakness       Past Surgical History:  Procedure Laterality Date  . ABDOMINAL AORTOGRAM W/LOWER EXTREMITY N/A 02/06/2019   Procedure: ABDOMINAL AORTOGRAM W/LOWER EXTREMITY;  Surgeon: [name], MD;  Location: [place] [facility];  Service: Cardiovascular;  Laterality: N/A;  bilateral  . ABDOMINAL AORTOGRAM W/LOWER EXTREMITY N/A 04/30/2019   Procedure: ABDOMINAL AORTOGRAM W/LOWER EXTREMITY;  Surgeon: [name], MD;  Location: [place] [facility];  Service: Cardiovascular;  Laterality: N/A;  . AORTOGRAM  08/11/2019   Procedure: Aortogram;  Surgeon: [name], MD;  Location: [place];  Service: Vascular;;  . ESOPHAGEAL BANDING  08/22/2019   Procedure: ESOPHAGEAL BANDING;  Surgeon:  Milus Banister, MD;  Location: [facility] ENDOSCOPY;  Service: Endoscopy;;  . ESOPHAGOGASTRODUODENOSCOPY N/A 08/22/2019   Procedure: ESOPHAGOGASTRODUODENOSCOPY (EGD);  Surgeon: Milus Banister, MD;  Location: [facility] ENDOSCOPY;  Service: Endoscopy;  Laterality: N/A;  . FEMORAL-POPLITEAL BYPASS GRAFT Left 08/11/2019   Procedure: BYPASS LEFT FEMORAL-DISTAL POPLITEAL ARTERY USING PROPATEN GRAFT;  Surgeon: [name], MD;  Location: [place];  Service: Vascular;  Laterality: Left;  . INSERTION OF DIALYSIS CATHETER Right 09/09/2019   Procedure: INSERTION OF DIALYSIS CATHETER;  Surgeon: Waynetta Sandy, MD;  Location: [place];  Service: Vascular;  Laterality:  Right;  . MULTIPLE TOOTH EXTRACTIONS    . PERIPHERAL VASCULAR INTERVENTION  02/06/2019   Procedure: PERIPHERAL VASCULAR INTERVENTION;  Surgeon: [name], MD;  Location: [place] [facility];  Service: Cardiovascular;;  Bilateral Iliacs  . PERIPHERAL VASCULAR INTERVENTION  04/30/2019   Procedure: PERIPHERAL VASCULAR INTERVENTION;  Surgeon: [name], MD;  Location: [place] [facility];  Service: Cardiovascular;;  Stent - Lt. Iliac   . REMOVAL OF A DIALYSIS CATHETER Right 09/09/2019   Procedure: Removal Of A Dialysis Catheter;  Surgeon: Waynetta Sandy, MD;  Location: [place];  Service: Vascular;  Laterality: Right;  . ULTRASOUND GUIDANCE FOR VASCULAR ACCESS Right 09/09/2019   Procedure: Ultrasound Guidance For Vascular Access;  Surgeon: Waynetta Sandy, MD;  Location: [place];  Service: Vascular;  Laterality: Right;  [facility] VASCULAR SURGERY        Chief Complaint  Patient presents with  . Allergic Reaction      HPI:    Edward Rocha  is a 53 y.o. male with past medical history relevant for HTN, DM2, HLD, liver cirrhosis and hypothyroidism who presented by EMS with concerns about possible tongue swelling or shortness of breath EMS gave epinephrine x2 and IV Solu-Medrol as well as Benadryl PTA  -Patient has stopped lisinopril over a week ago  -Patient denies fevers, chills, productive cough, denies pleuritic symptoms or chest pain  In Ed chest x-Millay consistent with CHF, echo with EF of 60 to 65% without regional wall motion analysis In ED-troponin is not elevated,, BNP is 448, no prior baseline -Creatinine is 1.0 with glucose of 219 and potassium of 3.1 and sodium of 131 -CBC with WBC of 10.8 H&H is 9.7 and 29.9 with a platelet count of 77  -Recent episode of acute renal failure requiring hemodialysis--- renal function not recovered patient on longer requires hemodialysis   Review of systems:    In addition to the HPI above,   A full Review of  Systems was  done, all other systems reviewed are negative except as noted above in HPI , .    Social History:  Reviewed by me    Social History   Tobacco Use  . Smoking status: Former Smoker    Packs/day: 1.00    Years: 36.00    Pack years: 36.00    Types: Cigarettes    Quit date: 01/25/2019    Years since quitting: 0.7  . Smokeless tobacco: Never Used  Substance Use Topics  . Alcohol use: Not Currently    Comment: per pt's wife, pt is an alcoholic but just recently stopped drinking in 04/2019      Family History :  Reviewed by me    Family History  Problem Relation Age of Onset  . Alcohol abuse Father   . Cancer Mother   . Alcohol abuse Brother   . Bipolar disorder Son   . Bipolar  disorder Daughter      Home Medications:   Prior to Admission medications   Medication Sig Start Date End Date Taking? Authorizing Provider  acetaminophen (TYLENOL) 500 MG tablet Take 1,000 mg by mouth daily as needed for moderate pain or headache.   Yes [provider]  atorvastatin (LIPITOR) 80 MG tablet Take 80 mg by mouth daily.  04/16/19  Yes [provider]  chlorthalidone (HYGROTON) 50 MG tablet Take 50 mg by mouth daily.   Yes [provider]  empagliflozin (JARDIANCE) 10 MG TABS tablet Take 10 mg by mouth daily.   Yes [provider]  gabapentin (NEURONTIN) 300 MG capsule Take 300 mg by mouth 3 (three) times daily.   Yes [provider]  glipiZIDE (GLUCOTROL) 5 MG tablet Take 5 mg by mouth 2 (two) times daily before a meal.    Yes [provider]  labetalol (NORMODYNE) 300 MG tablet Take 300 mg by mouth daily.    Yes [provider]  lactulose (CHRONULAC) 10 GM/15ML solution Take 15 mLs (10 g total) by mouth daily. 09/24/19  Yes Shah, Pratik D, DO  magnesium oxide (MAG-OX) 400 MG tablet Take 1 tablet (400 mg total) by mouth daily. 09/23/19 10/23/19 Yes Shah, Pratik D, DO  NIFEdipine (ADALAT CC) 30 MG 24 hr tablet Take 1 tablet (30 mg  total) by mouth daily. 09/24/19 10/24/19 Yes Shah, Pratik D, DO  oxyCODONE-acetaminophen (PERCOCET/ROXICET) 5-325 MG tablet Take 1 tablet by mouth every 6 (six) hours as needed. Patient taking differently: Take 1 tablet by mouth every 6 (six) hours as needed for moderate pain.  09/10/19  Yes Laurence Slate M, PA-C  potassium chloride (KLOR-CON) 10 MEQ tablet Take 1 tablet (10 mEq total) by mouth daily. 09/23/19 10/23/19 Yes Shah, Pratik D, DO  clopidogrel (PLAVIX) 75 MG tablet Take 75 mg by mouth daily.    [provider]  diphenhydrAMINE (BENADRYL) 25 MG tablet Take 25 mg by mouth daily as needed for itching.    [provider]  furosemide (LASIX) 20 MG tablet Take 20 mg by mouth 2 (two) times daily. 10/09/19   [provider]  Insulin Glargine (BASAGLAR KWIKPEN) 100 UNIT/ML SOPN Inject 20 Units into the skin every evening.    [provider]     Allergies:     Allergies  Allergen Reactions  . Other     Pt received platelets and had a bad reaction from infusion      Physical Exam:   Vitals  Blood pressure 137/79, pulse (!) 107, temperature 98.4 F (36.9 C), temperature source Oral, resp. rate 18, height 6\' 1"  (1.854 m), weight 99.8 kg, SpO2 92 %.  Physical Examination: General appearance - alert, well appearing, and in no distress Mental status - alert, oriented to person, place, and time,  Eyes - sclera anicteric Nose- [place] 2L/min Neck - supple, no JVD elevation , Chest -diminished in bases with faint bibasilar rales  heart - S1 and S2 normal, regular  Abdomen - soft, nontender, nondistended, no masses or organomegaly Neurological - screening mental status exam normal, neck supple without rigidity, cranial nerves II through XII intact, DTR's normal and symmetric Extremities - 1 + pedal edema noted, intact peripheral pulses  -Right upper extremity swelling Skin - warm, dry     Data Review:    CBC Recent Labs  Lab 10/18/19 0053  WBC  10.8*  HGB 9.7*  HCT 29.9*  PLT 77*  MCV 86.4  MCH 28.0  MCHC 32.4  RDW 15.0   ------------------------------------------------------------------------------------------------------------------  Chemistries  Recent Labs  Lab 10/18/19 0053  NA 131*  K 3.1*  CL 102  CO2 22  GLUCOSE 219*  BUN 16  CREATININE 1.03  CALCIUM 8.1*   ------------------------------------------------------------------------------------------------------------------ estimated creatinine clearance is 100.7 mL/min (by C-G formula based on SCr of 1.03 mg/dL). ------------------------------------------------------------------------------------------------------------------ No results for input(s): TSH, T4TOTAL, T3FREE, THYROIDAB in the last 72 hours.  Invalid input(s): FREET3   Coagulation profile No results for input(s): INR, PROTIME in the last 168 hours. ------------------------------------------------------------------------------------------------------------------- No results for input(s): DDIMER in the last 72 hours. -------------------------------------------------------------------------------------------------------------------  Cardiac Enzymes No results for input(s): CKMB, TROPONINI, MYOGLOBIN in the last 168 hours.  Invalid input(s): CK ------------------------------------------------------------------------------------------------------------------    Component Value Date/Time   BNP 448.0 (H) 10/18/2019 0552     ---------------------------------------------------------------------------------------------------------------  Urinalysis    Component Value Date/Time   COLORURINE AMBER (A) 08/13/2019 1309   APPEARANCEUR CLOUDY (A) 08/13/2019 1309   LABSPEC 1.014 08/13/2019 1309   PHURINE 5.0 08/13/2019 1309   GLUCOSEU >=500 (A) 08/13/2019 1309   HGBUR LARGE (A) 08/13/2019 1309   BILIRUBINUR NEGATIVE 08/13/2019 1309   KETONESUR NEGATIVE 08/13/2019 1309   PROTEINUR 100 (A)  08/13/2019 1309   NITRITE NEGATIVE 08/13/2019 1309   LEUKOCYTESUR NEGATIVE 08/13/2019 1309    ----------------------------------------------------------------------------------------------------------------   Imaging Results:    DG Chest Port 1 View  Result Date: 10/18/2019 CLINICAL DATA:  Shortness of breath.  Swelling. EXAM: PORTABLE CHEST 1 VIEW COMPARISON:  Radiograph and CT 10/05/2019 FINDINGS: Right dialysis catheter in the lower SVC. Cardiomegaly. There is vascular congestion with central peribronchial thickening which may represent edema. Bilateral pleural effusions. No pneumothorax. IMPRESSION: Cardiomegaly with vascular congestion and pulmonary edema. Bilateral pleural effusions. Findings consistent with CHF. Electronically Signed   By: Keith Rake M.D.   On: 10/18/2019 01:54   ECHOCARDIOGRAM COMPLETE  Result Date: 10/18/2019   ECHOCARDIOGRAM REPORT   Patient Name:   Edward Rocha. Date of Exam: 10/18/2019 Medical Rec #:  [id number]        Height:       73.0 in Accession #:    [id number]       Weight:       220.0 lb Date of Birth:  1965-12-30        BSA:          2.24 m Patient Age:    86 years         BP:           145/75 mmHg Patient Gender: M                HR:           96 bpm. Exam Location:  [facility] Procedure: 2D Echo, Cardiac Doppler and Color Doppler Indications:    Dyspnea  History:        Patient has no prior history of Echocardiogram examinations.                 CHF, CAD, PVD; Risk Factors:Hypertension, Diabetes and                 Dyslipidemia. ARF.  Sonographer:    Dustin Flock RDCS Referring Phys: [place]  1. Left ventricular ejection fraction, by visual estimation, is 60 to 65%. The left ventricle has normal function. There is no left ventricular hypertrophy.  2. Left ventricular diastolic parameters are indeterminate.  3. The left ventricle has no regional wall motion  abnormalities.  4. Global right ventricle has normal systolic  function.The right ventricular size is small. No increase in right ventricular wall thickness.  5. Left atrial size was mildly dilated.  6. Right atrial size was normal.  7. Trivial pericardial effusion is present.  8. Mild to moderate mitral annular calcification.  9. The mitral valve is normal in structure. Trivial mitral valve regurgitation. 10. The tricuspid valve is normal in structure. Tricuspid valve regurgitation is mild. 11. The aortic valve is tricuspid. Aortic valve regurgitation is not visualized. Mild aortic valve sclerosis without stenosis. 12. The pulmonic valve was grossly normal. Pulmonic valve regurgitation is not visualized. 13. The inferior vena cava is dilated in size with >50% respiratory variability, suggesting right atrial pressure of 8 mmHg. FINDINGS  Left Ventricle: Left ventricular ejection fraction, by visual estimation, is 60 to 65%. The left ventricle has normal function. The left ventricle has no regional wall motion abnormalities. There is no left ventricular hypertrophy. Left ventricular diastolic parameters are indeterminate. Right Ventricle: The right ventricular size is small. No increase in right ventricular wall thickness. Global RV systolic function is has normal systolic function. Left Atrium: Left atrial size was mildly dilated. Right Atrium: Right atrial size was normal in size Pericardium: Trivial pericardial effusion is present. Mitral Valve: The mitral valve is normal in structure. Mild to moderate mitral annular calcification. Trivial mitral valve regurgitation. Tricuspid Valve: The tricuspid valve is normal in structure. Tricuspid valve regurgitation is mild. Aortic Valve: The aortic valve is tricuspid. Aortic valve regurgitation is not visualized. Mild aortic valve sclerosis is present, with no evidence of aortic valve stenosis. Pulmonic Valve: The pulmonic valve was grossly normal. Pulmonic valve regurgitation is not visualized. Pulmonic regurgitation is not  visualized. Aorta: The aortic root and ascending aorta are structurally normal, with no evidence of dilitation. Venous: The inferior vena cava is dilated in size with greater than 50% respiratory variability, suggesting right atrial pressure of 8 mmHg. IAS/Shunts: No atrial level shunt detected by color flow Doppler.  LEFT VENTRICLE PLAX 2D LVIDd:         5.28 cm  Diastology LVIDs:         3.29 cm  LV e' lateral:   9.03 cm/s LV PW:         1.10 cm  LV E/e' lateral: 15.3 LV IVS:        1.17 cm  LV e' medial:    8.49 cm/s LVOT diam:     2.10 cm  LV E/e' medial:  16.3 LV SV:         90 ml LV SV Index:   39.52 LVOT Area:     3.46 cm  RIGHT VENTRICLE RV Basal diam:  2.52 cm RV S prime:     11.20 cm/s TAPSE (M-mode): 2.9 cm LEFT ATRIUM             Index       RIGHT ATRIUM           Index LA diam:        5.20 cm 2.32 cm/m  RA Area:     15.70 cm LA Vol (A2C):   45.0 ml 20.08 ml/m RA Volume:   36.20 ml  16.16 ml/m LA Vol (A4C):   75.0 ml 33.47 ml/m LA Biplane Vol: 57.5 ml 25.66 ml/m  AORTIC VALVE LVOT Vmax:   106.00 cm/s LVOT Vmean:  74.400 cm/s LVOT VTI:    0.219 m  AORTA Ao Root diam: 3.00 cm MITRAL  VALVE                         TRICUSPID VALVE MV Area (PHT): 5.75 cm              TR Peak grad:   45.6 mmHg MV PHT:        38.28 msec            TR Vmax:        340.00 cm/s MV Decel Time: 132 msec MV E velocity: 138.00 cm/s 103 cm/s  SHUNTS MV A velocity: 129.00 cm/s 70.3 cm/s Systemic VTI:  0.22 m MV E/A ratio:  1.07        1.5       Systemic Diam: 2.10 cm  Buford Dresser MD Electronically signed by Buford Dresser MD Signature Date/Time: 10/18/2019/3:22:11 PM    Final     Radiological Exams on Admission: DG Chest Port 1 View  Result Date: 10/18/2019 CLINICAL DATA:  Shortness of breath.  Swelling. EXAM: PORTABLE CHEST 1 VIEW COMPARISON:  Radiograph and CT 10/05/2019 FINDINGS: Right dialysis catheter in the lower SVC. Cardiomegaly. There is vascular congestion with central peribronchial thickening  which may represent edema. Bilateral pleural effusions. No pneumothorax. IMPRESSION: Cardiomegaly with vascular congestion and pulmonary edema. Bilateral pleural effusions. Findings consistent with CHF. Electronically Signed   By: Keith Rake M.D.   On: 10/18/2019 01:54   ECHOCARDIOGRAM COMPLETE  Result Date: 10/18/2019   ECHOCARDIOGRAM REPORT   Patient Name:   Edward Rocha. Date of Exam: 10/18/2019 Medical Rec #:  [id number]        Height:       73.0 in Accession #:    [id number]       Weight:       220.0 lb Date of Birth:  1966-08-19        BSA:          2.24 m Patient Age:    15 years         BP:           145/75 mmHg Patient Gender: M                HR:           96 bpm. Exam Location:  [facility] Procedure: 2D Echo, Cardiac Doppler and Color Doppler Indications:    Dyspnea  History:        Patient has no prior history of Echocardiogram examinations.                 CHF, CAD, PVD; Risk Factors:Hypertension, Diabetes and                 Dyslipidemia. ARF.  Sonographer:    Dustin Flock RDCS Referring Phys: [place]  1. Left ventricular ejection fraction, by visual estimation, is 60 to 65%. The left ventricle has normal function. There is no left ventricular hypertrophy.  2. Left ventricular diastolic parameters are indeterminate.  3. The left ventricle has no regional wall motion abnormalities.  4. Global right ventricle has normal systolic function.The right ventricular size is small. No increase in right ventricular wall thickness.  5. Left atrial size was mildly dilated.  6. Right atrial size was normal.  7. Trivial pericardial effusion is present.  8. Mild to moderate mitral annular calcification.  9. The mitral valve is normal in structure. Trivial mitral valve regurgitation. 10. The tricuspid valve is normal in  structure. Tricuspid valve regurgitation is mild. 11. The aortic valve is tricuspid. Aortic valve regurgitation is not visualized. Mild aortic valve  sclerosis without stenosis. 12. The pulmonic valve was grossly normal. Pulmonic valve regurgitation is not visualized. 13. The inferior vena cava is dilated in size with >50% respiratory variability, suggesting right atrial pressure of 8 mmHg. FINDINGS  Left Ventricle: Left ventricular ejection fraction, by visual estimation, is 60 to 65%. The left ventricle has normal function. The left ventricle has no regional wall motion abnormalities. There is no left ventricular hypertrophy. Left ventricular diastolic parameters are indeterminate. Right Ventricle: The right ventricular size is small. No increase in right ventricular wall thickness. Global RV systolic function is has normal systolic function. Left Atrium: Left atrial size was mildly dilated. Right Atrium: Right atrial size was normal in size Pericardium: Trivial pericardial effusion is present. Mitral Valve: The mitral valve is normal in structure. Mild to moderate mitral annular calcification. Trivial mitral valve regurgitation. Tricuspid Valve: The tricuspid valve is normal in structure. Tricuspid valve regurgitation is mild. Aortic Valve: The aortic valve is tricuspid. Aortic valve regurgitation is not visualized. Mild aortic valve sclerosis is present, with no evidence of aortic valve stenosis. Pulmonic Valve: The pulmonic valve was grossly normal. Pulmonic valve regurgitation is not visualized. Pulmonic regurgitation is not visualized. Aorta: The aortic root and ascending aorta are structurally normal, with no evidence of dilitation. Venous: The inferior vena cava is dilated in size with greater than 50% respiratory variability, suggesting right atrial pressure of 8 mmHg. IAS/Shunts: No atrial level shunt detected by color flow Doppler.  LEFT VENTRICLE PLAX 2D LVIDd:         5.28 cm  Diastology LVIDs:         3.29 cm  LV e' lateral:   9.03 cm/s LV PW:         1.10 cm  LV E/e' lateral: 15.3 LV IVS:        1.17 cm  LV e' medial:    8.49 cm/s LVOT diam:      2.10 cm  LV E/e' medial:  16.3 LV SV:         90 ml LV SV Index:   39.52 LVOT Area:     3.46 cm  RIGHT VENTRICLE RV Basal diam:  2.52 cm RV S prime:     11.20 cm/s TAPSE (M-mode): 2.9 cm LEFT ATRIUM             Index       RIGHT ATRIUM           Index LA diam:        5.20 cm 2.32 cm/m  RA Area:     15.70 cm LA Vol (A2C):   45.0 ml 20.08 ml/m RA Volume:   36.20 ml  16.16 ml/m LA Vol (A4C):   75.0 ml 33.47 ml/m LA Biplane Vol: 57.5 ml 25.66 ml/m  AORTIC VALVE LVOT Vmax:   106.00 cm/s LVOT Vmean:  74.400 cm/s LVOT VTI:    0.219 m  AORTA Ao Root diam: 3.00 cm MITRAL VALVE                         TRICUSPID VALVE MV Area (PHT): 5.75 cm              TR Peak grad:   45.6 mmHg MV PHT:        38.28 msec  TR Vmax:        340.00 cm/s MV Decel Time: 132 msec MV E velocity: 138.00 cm/s 103 cm/s  SHUNTS MV A velocity: 129.00 cm/s 70.3 cm/s Systemic VTI:  0.22 m MV E/A ratio:  1.07        1.5       Systemic Diam: 2.10 cm  Buford Dresser MD Electronically signed by Buford Dresser MD Signature Date/Time: 10/18/2019/3:22:11 PM    Final     DVT Prophylaxis -SCD  /low platelets AM Labs Ordered, also please review Full Orders  Family Communication: Admission, patients condition and plan of care including tests being ordered have been discussed with the patient  who indicate understanding and agree with the plan   Code Status - Full Code  Likely DC to  home  Condition   stable  Roxan Hockey M.D on 10/18/2019 at 5:07 PM Go to [email or web address] -  for contact info  [facility] - Office  [phone]

## 2019-10-18 NOTE — ED Notes (Signed)
Waiting on pharmacy to review patient's orders so I can give morning medications that are ordered. Pharmacist called at this and I asked them to bring his medicaitons to the ED so they could be administered.

## 2019-10-19 ENCOUNTER — Observation Stay (HOSPITAL_COMMUNITY): Payer: Managed Care, Other (non HMO)

## 2019-10-19 DIAGNOSIS — I5031 Acute diastolic (congestive) heart failure: Secondary | ICD-10-CM | POA: Diagnosis not present

## 2019-10-19 LAB — CBC
HCT: 26.5 % — ABNORMAL LOW (ref 39.0–52.0)
Hemoglobin: 8.5 g/dL — ABNORMAL LOW (ref 13.0–17.0)
MCH: 27.5 pg (ref 26.0–34.0)
MCHC: 32.1 g/dL (ref 30.0–36.0)
MCV: 85.8 fL (ref 80.0–100.0)
Platelets: 66 10*3/uL — ABNORMAL LOW (ref 150–400)
RBC: 3.09 MIL/uL — ABNORMAL LOW (ref 4.22–5.81)
RDW: 15.5 % (ref 11.5–15.5)
WBC: 7.4 10*3/uL (ref 4.0–10.5)
nRBC: 0 % (ref 0.0–0.2)

## 2019-10-19 LAB — BASIC METABOLIC PANEL
Anion gap: 9 (ref 5–15)
BUN: 39 mg/dL — ABNORMAL HIGH (ref 6–20)
CO2: 21 mmol/L — ABNORMAL LOW (ref 22–32)
Calcium: 8.1 mg/dL — ABNORMAL LOW (ref 8.9–10.3)
Chloride: 102 mmol/L (ref 98–111)
Creatinine, Ser: 1.63 mg/dL — ABNORMAL HIGH (ref 0.61–1.24)
GFR calc Af Amer: 54 mL/min — ABNORMAL LOW (ref >60)
GFR calc non Af Amer: 47 mL/min — ABNORMAL LOW (ref >60)
Glucose, Bld: 248 mg/dL — ABNORMAL HIGH (ref 70–99)
Potassium: 4.4 mmol/L (ref 3.5–5.1)
Sodium: 132 mmol/L — ABNORMAL LOW (ref 135–145)

## 2019-10-19 LAB — GLUCOSE, CAPILLARY
Glucose-Capillary: 202 mg/dL — ABNORMAL HIGH (ref 70–99)
Glucose-Capillary: 220 mg/dL — ABNORMAL HIGH (ref 70–99)

## 2019-10-19 LAB — SARS CORONAVIRUS 2 (TAT 6-24 HRS): SARS Coronavirus 2: NEGATIVE

## 2019-10-19 MED ORDER — LACTULOSE 10 GM/15ML PO SOLN: 10.0000 g | Freq: Every day | ORAL | 5 refills | Status: DC

## 2019-10-19 MED ORDER — RANITIDINE HCL 300 MG PO TABS: 300.0000 mg | ORAL_TABLET | Freq: Every day | ORAL | 0 refills | Status: DC

## 2019-10-19 MED ORDER — PREDNISONE 20 MG PO TABS: 20.0000 mg | ORAL_TABLET | Freq: Every day | ORAL | 0 refills | Status: AC

## 2019-10-19 MED ORDER — TORSEMIDE 20 MG PO TABS: 20.0000 mg | ORAL_TABLET | ORAL | 4 refills | Status: DC

## 2019-10-19 NOTE — Discharge Summary (Signed)
Edward Laduke., is a 53 y.o. male  DOB 1966-04-08  MRN [id number].  Admission date:  10/18/2019  Admitting Physician  Roxan Hockey, MD  Discharge Date:  10/19/2019   Primary MD  Neale Burly, MD  Recommendations for primary care physician for things to follow:    1)Very low-salt diet advised 2)Weigh yourself daily, call if you gain more than 3 pounds in 1 day or more than 5 pounds in 1 week as your diuretic medications may need to be adjusted 3)Limit your Fluid  intake to no more than 60 ounces (1.8 Liters) per day 4)Avoid ibuprofen/Advil/Aleve/Motrin/Goody Powders/Naproxen/BC powders/Meloxicam/Diclofenac/Indomethacin and other Nonsteroidal anti-inflammatory medications as these will make you more likely to bleed and can cause stomach ulcers, can also cause Kidney problems.  5)STOP Empagliflozin (JARDIANCE) 6)Stop Glipizide 7) please repeat CBC and BMP blood test with the primary care physician around Wednesday, 10/22/2019 8) stop chlorthalidone 9) stop furosemide/Lasix 10) please take Demadex/torsemide as prescribed in place of Lasix/furosemide  Admission Diagnosis  Angioedema [T78.3XXA] Acute pulmonary edema (HCC) [J81.0] Acute exacerbation of CHF (congestive heart failure) ([place]) [I50.9] Angioedema, initial encounter [T78.3XXA]   Discharge Diagnosis  Angioedema [T78.3XXA] Acute pulmonary edema (HCC) [J81.0] Acute exacerbation of CHF (congestive heart failure) ([place]) [I50.9] Angioedema, initial encounter [T78.3XXA]    Principal Problem:   Acute heart failure with preserved ejection fraction (HFpEF) (HCC) Active Problems:   Angioedema   Diabetes ([place])   PAD (peripheral artery disease) (HCC)   Cirrhosis of liver (HCC)   Acute exacerbation of CHF (congestive heart failure) ([place])      Past Medical History:  Diagnosis Date  . Acute renal failure (ARF) ([place])    12/2018 when admitted for  hyperosmolar hyperglycemic nonketotic syndrome  . Bilateral pain of leg and foot   . CHF (congestive heart failure) ([place])   . Coronary artery disease   . Diabetes mellitus without complication ([place])    Type II  . Dizziness   . Elevated LFTs 05/2019  . Frequent urination   . GERD (gastroesophageal reflux disease)   . Hyperglycemia    Non-Ketotic Hyperosmolar.  . Hyperlipidemia   . Hypernatremia   . Hypertension   . Peripheral vascular disease ([place])   . Syncope    12/2018, diagnosed with hyperosmolar hyperglycemic nonketotic syndrome, AKI, hyponatremia ([facility])  . Thrombocytopenia ([place])   . Thyroid disease    Hypothyroidism  . Weakness     Past Surgical History:  Procedure Laterality Date  . ABDOMINAL AORTOGRAM W/LOWER EXTREMITY N/A 02/06/2019   Procedure: ABDOMINAL AORTOGRAM W/LOWER EXTREMITY;  Surgeon: [name], MD;  Location: [place] [facility];  Service: Cardiovascular;  Laterality: N/A;  bilateral  . ABDOMINAL AORTOGRAM W/LOWER EXTREMITY N/A 04/30/2019   Procedure: ABDOMINAL AORTOGRAM W/LOWER EXTREMITY;  Surgeon: [name], MD;  Location: [place] [facility];  Service: Cardiovascular;  Laterality: N/A;  . AORTOGRAM  08/11/2019   Procedure: Aortogram;  Surgeon: [name], MD;  Location: [place];  Service: Vascular;;  . ESOPHAGEAL BANDING  08/22/2019   Procedure: ESOPHAGEAL BANDING;  Surgeon: Milus Banister, MD;  Location: [facility] ENDOSCOPY;  Service: Endoscopy;;  . ESOPHAGOGASTRODUODENOSCOPY N/A 08/22/2019   Procedure: ESOPHAGOGASTRODUODENOSCOPY (EGD);  Surgeon: Milus Banister, MD;  Location: [facility] ENDOSCOPY;  Service: Endoscopy;  Laterality: N/A;  . FEMORAL-POPLITEAL BYPASS GRAFT Left 08/11/2019   Procedure: BYPASS LEFT FEMORAL-DISTAL POPLITEAL ARTERY USING PROPATEN GRAFT;  Surgeon: [name], MD;  Location: [place];  Service: Vascular;  Laterality: Left;  . INSERTION OF DIALYSIS CATHETER Right 09/09/2019   Procedure: INSERTION OF  DIALYSIS CATHETER;  Surgeon: Waynetta Sandy, MD;  Location: [place];  Service: Vascular;  Laterality: Right;  . MULTIPLE TOOTH EXTRACTIONS    . PERIPHERAL VASCULAR INTERVENTION  02/06/2019   Procedure: PERIPHERAL VASCULAR INTERVENTION;  Surgeon: [name], MD;  Location: [place] [facility];  Service: Cardiovascular;;  Bilateral Iliacs  . PERIPHERAL VASCULAR INTERVENTION  04/30/2019   Procedure: PERIPHERAL VASCULAR INTERVENTION;  Surgeon: [name], MD;  Location: [place] [facility];  Service: Cardiovascular;;  Stent - Lt. Iliac   . REMOVAL OF A DIALYSIS CATHETER Right 09/09/2019   Procedure: Removal Of A Dialysis Catheter;  Surgeon: Waynetta Sandy, MD;  Location: [place];  Service: Vascular;  Laterality: Right;  . ULTRASOUND GUIDANCE FOR VASCULAR ACCESS Right 09/09/2019   Procedure: Ultrasound Guidance For Vascular Access;  Surgeon: Waynetta Sandy, MD;  Location: [place];  Service: Vascular;  Laterality: Right;  [facility] VASCULAR SURGERY       HPI  from the history and physical done on the day of admission:   - Edward Rocha  is a 53 y.o. male with past medical history relevant for HTN, DM2, HLD, liver cirrhosis and hypothyroidism who presented by EMS with concerns about possible tongue swelling or shortness of breath EMS gave epinephrine x2 and IV Solu-Medrol as well as Benadryl PTA  -Patient has stopped lisinopril over a week ago  -Patient denies fevers, chills, productive cough, denies pleuritic symptoms or chest pain  In Ed chest x-[place] consistent with CHF, echo with EF of 60 to 65% without regional wall motion analysis In ED-troponin is not elevated,, BNP is 448, no prior baseline -Creatinine is 1.0 with glucose of 219 and potassium of 3.1 and sodium of 131 -CBC with WBC of 10.8 H&H is 9.7 and 29.9 with a platelet count of 77  -Recent episode of acute renal failure requiring hemodialysis--- renal function not recovered patient on longer requires  hemodialysis     Hospital Course:     1)HFpEF--- new onset CHF, echo with EF of 60-65 % without regional wall motion abnormalities -Troponins are flat -Dyspnea improved significantly with IV diuresis -Hypoxia resolved -Ambulated patient about 150 feet O2 sats dropped from 90% to 96% post ambulation -No significant dyspnea on exertion -Stop furosemide, discharged home on torsemide 20 mg daily on 10 mg every afternoon -Fluid balance is negative, weight is down to 202 pounds from 220 pounds--- query the accuracy of the weight scale  2)DM2-A1c is 5.3 reflecting excellent DM control, avoid over aggressive diabetic control in order to avoid hypoglycemia -Okay to stop glipizide and Jardiance -Continue insulin therapy   3) right IJ HD catheter--patient is apparently scheduled for catheter removal on 10/20/2019  4) possible angioedema--- low clinical index of suspicion however patient received epinephrine x2, and the Solu-Medrol and Benadryl PTA, currently stable, continue Pepcid and Benadryl -Unable to identify offending agent -Patient stopped lisinopril apparently over a week ago -Overall remains quite stable -No  further symptoms or concerns  5)H/o CAD and PAD--- stable, no ACS type symptoms, continue Lipitor and Plavix,  6)Liver Cirrhosis-no encephalopathy, continue lactulose -Patient with chronic anemia thrombocytopenia in the setting of cirrhosis -No bleeding concerns at this time  7)HTN--stable, continue nifedipine 30 mg daily, labetalol   twice daily,  8)AKI----acute kidney injury -     creatinine on admission=1.0   ,   baseline creatinine = 1.3 (09/23/19)   , creatinine is now= 1.6     , renally adjust medications, avoid nephrotoxic agents /dehydration/hypotension -Worsening renal function due to IV diuresis,  -Please note the patient recently had kidney failure requiring dialysis in 2020 - -Repeat BMP on 10/22/2019 advised -The importance of repeat BMP on 10/22/2019  emphasized to patient given his recent history of renal failure   Discharge Condition: stable  Follow UP--PCP and nephrologist  Diet and Activity recommendation:  As advised  Discharge Instructions    Discharge Instructions    Call MD for:  difficulty breathing, headache or visual disturbances   Complete by: As directed    Call MD for:  persistant dizziness or light-headedness   Complete by: As directed    Call MD for:  persistant nausea and vomiting   Complete by: As directed    Call MD for:  severe uncontrolled pain   Complete by: As directed    Call MD for:  temperature >100.4   Complete by: As directed    Diet - low sodium heart healthy   Complete by: As directed    Diet Carb Modified   Complete by: As directed    Discharge instructions   Complete by: As directed    1)Very low-salt diet advised 2)Weigh yourself daily, call if you gain more than 3 pounds in 1 day or more than 5 pounds in 1 week as your diuretic medications may need to be adjusted 3)Limit your Fluid  intake to no more than 60 ounces (1.8 Liters) per day 4)Avoid ibuprofen/Advil/Aleve/Motrin/Goody Powders/Naproxen/BC powders/Meloxicam/Diclofenac/Indomethacin and other Nonsteroidal anti-inflammatory medications as these will make you more likely to bleed and can cause stomach ulcers, can also cause Kidney problems.  5)STOP Empagliflozin (JARDIANCE) 6)Stop Glipizide 7) please repeat CBC and BMP blood test with the primary care physician around Wednesday, 10/22/2019 8) stop chlorthalidone 9) stop furosemide/Lasix 10) please take Demadex/torsemide as prescribed in place of Lasix/furosemide   Increase activity slowly   Complete by: As directed         Discharge Medications     Allergies as of 10/19/2019      Reactions   Other    Pt received platelets and had a bad reaction from infusion       Medication List    STOP taking these medications   chlorthalidone 50 MG tablet Commonly known as:  HYGROTON   furosemide 20 MG tablet Commonly known as: LASIX   glipiZIDE 5 MG tablet Commonly known as: GLUCOTROL   Jardiance 10 MG Tabs tablet Generic drug: empagliflozin     TAKE these medications   acetaminophen 500 MG tablet Commonly known as: TYLENOL Take 1,000 mg by mouth daily as needed for moderate pain or headache.   atorvastatin 80 MG tablet Commonly known as: LIPITOR Take 80 mg by mouth daily.   Basaglar KwikPen 100 UNIT/ML Sopn Inject 20 Units into the skin every evening.   clopidogrel 75 MG tablet Commonly known as: PLAVIX Take 75 mg by mouth daily.   diphenhydrAMINE 25 MG tablet Commonly known as: BENADRYL Take 25  mg by mouth daily as needed for itching.   gabapentin 300 MG capsule Commonly known as: NEURONTIN Take 300 mg by mouth 3 (three) times daily.   labetalol 300 MG tablet Commonly known as: NORMODYNE Take 300 mg by mouth daily.   lactulose 10 GM/15ML solution Commonly known as: CHRONULAC Take 15 mLs (10 g total) by mouth daily.   magnesium oxide 400 MG tablet Commonly known as: MAG-OX Take 1 tablet (400 mg total) by mouth daily.   NIFEdipine 30 MG 24 hr tablet Commonly known as: ADALAT CC Take 1 tablet (30 mg total) by mouth daily.   oxyCODONE-acetaminophen 5-325 MG tablet Commonly known as: PERCOCET/ROXICET Take 1 tablet by mouth every 6 (six) hours as needed. What changed: reasons to take this   potassium chloride 10 MEQ tablet Commonly known as: KLOR-CON Take 1 tablet (10 mEq total) by mouth daily.   predniSONE 20 MG tablet Commonly known as: Deltasone Take 1 tablet (20 mg total) by mouth daily with breakfast for 3 days.   ranitidine 300 MG tablet Commonly known as: ZANTAC Take 1 tablet (300 mg total) by mouth at bedtime.   torsemide 20 MG tablet Commonly known as: Demadex Take 1 tablet (20 mg total) by mouth See admin instructions. Take 1 tablet (20 mg) every morning and half a tablet (10mg ) early in the evening every  day       Major procedures and Radiology Reports - PLEASE review detailed and final reports for all details, in brief -    CT Head Wo Contrast  Result Date: 09/20/2019 CLINICAL DATA:  Encephalopathy confusion EXAM: CT HEAD WITHOUT CONTRAST TECHNIQUE: Contiguous axial images were obtained from the base of the skull through the vertex without intravenous contrast. COMPARISON:  CT brain 08/24/2019 FINDINGS: Brain: No acute territorial infarction, hemorrhage, or intracranial mass. Mild atrophy. Mild hypodensity in the white matter consistent with small vessel ischemic change. Stable ventricle size Vascular: No hyperdense vessels.  Carotid vascular calcification Skull: Normal. Negative for fracture or focal lesion. Sinuses/Orbits: Mucosal thickening in the sinuses Other: None. IMPRESSION: 1. No CT evidence for acute intracranial abnormality. 2. Atrophy and mild small vessel ischemic changes of the white matter Electronically Signed   By: Donavan Foil M.D.   On: 09/20/2019 19:16   Venous Doppler Edward Rocha ONLY  Result Date: 10/19/2019 CLINICAL DATA:  53 year old male with bilateral lower extremity edema worse on the left than the right for the past 2 months. EXAM: BILATERAL LOWER EXTREMITY VENOUS DOPPLER ULTRASOUND TECHNIQUE: Gray-scale sonography with graded compression, as well as color Doppler and duplex ultrasound were performed to evaluate the lower extremity deep venous systems from the level of the common femoral vein and including the common femoral, femoral, profunda femoral, popliteal and calf veins including the posterior tibial, peroneal and gastrocnemius veins when visible. The superficial great saphenous vein was also interrogated. Spectral Doppler was utilized to evaluate flow at rest and with distal augmentation maneuvers in the common femoral, femoral and popliteal veins. COMPARISON:  None. FINDINGS: RIGHT LOWER EXTREMITY Common Femoral Vein: No evidence of thrombus. Normal  compressibility, respiratory phasicity and response to augmentation. Saphenofemoral Junction: No evidence of thrombus. Normal compressibility and flow on color Doppler imaging. Profunda Femoral Vein: No evidence of thrombus. Normal compressibility and flow on color Doppler imaging. Femoral Vein: No evidence of thrombus. Normal compressibility, respiratory phasicity and response to augmentation. Popliteal Vein: No evidence of thrombus. Normal compressibility, respiratory phasicity and response to augmentation. Calf Veins: No evidence of thrombus. Normal  compressibility and flow on color Doppler imaging. Superficial Great Saphenous Vein: No evidence of thrombus. Normal compressibility. Venous Reflux:  None. Other Findings:  None. LEFT LOWER EXTREMITY Common Femoral Vein: No evidence of thrombus. Normal compressibility, respiratory phasicity and response to augmentation. Saphenofemoral Junction: No evidence of thrombus. Normal compressibility and flow on color Doppler imaging. Profunda Femoral Vein: No evidence of thrombus. Normal compressibility and flow on color Doppler imaging. Femoral Vein: No evidence of thrombus. Normal compressibility, respiratory phasicity and response to augmentation. Popliteal Vein: No evidence of thrombus. Normal compressibility, respiratory phasicity and response to augmentation. Calf Veins: No evidence of thrombus. Normal compressibility and flow on color Doppler imaging. Superficial Great Saphenous Vein: Previously harvested for CABG. Venous Reflux:  None. Other Findings: In the left inguinal region, there is a large 6.4 by 5.3 x 3.6 cm nearly anechoic fluid collection with internal septations. IMPRESSION: 1. No evidence of deep venous thrombosis in either lower extremity. 2. In the left inguinal region there is a large mildly complex cystic fluid collection measuring 6.4 x 5.3 x 3.6 cm. Differential considerations are broad. If the patient has had prior surgery in this region, this could  represent a seroma, lymphocele or the liquified remnants of a prior hematoma. Alternately, this could represent centrally necrotic lymphadenopathy if the patient has a known history of squamous cell carcinoma. Electronically Signed   By: Jacqulynn Cadet M.D.   On: 10/19/2019 11:14   DG Chest Port 1 View  Result Date: 10/18/2019 CLINICAL DATA:  Shortness of breath.  Swelling. EXAM: PORTABLE CHEST 1 VIEW COMPARISON:  Radiograph and CT 10/05/2019 FINDINGS: Right dialysis catheter in the lower SVC. Cardiomegaly. There is vascular congestion with central peribronchial thickening which may represent edema. Bilateral pleural effusions. No pneumothorax. IMPRESSION: Cardiomegaly with vascular congestion and pulmonary edema. Bilateral pleural effusions. Findings consistent with CHF. Electronically Signed   By: Keith Rake M.D.   On: 10/18/2019 01:54   DG Chest Portable 1 View  Result Date: 09/20/2019 CLINICAL DATA:  AMS, pt missed renal dialysis today, pt restless and unable to follow commands, held back flat for image EXAM: PORTABLE CHEST 1 VIEW COMPARISON:  09/09/2019 FINDINGS: Cardiac silhouette borderline enlarged. No mediastinal or hilar masses. Clear lungs.  No pleural effusion or pneumothorax. Right internal jugular dual-lumen tunneled central venous catheter is stable. Skeletal structures are grossly intact. IMPRESSION: No acute cardiopulmonary disease. Electronically Signed   By: Lajean Manes M.D.   On: 09/20/2019 17:59   ECHOCARDIOGRAM COMPLETE  Result Date: 10/18/2019   ECHOCARDIOGRAM REPORT   Patient Name:   Edward Shively. Date of Exam: 10/18/2019 Medical Rec #:  [id number]        Height:       73.0 in Accession #:    [id number]       Weight:       220.0 lb Date of Birth:  22-Jan-1966        BSA:          2.24 m Patient Age:    43 years         BP:           145/75 mmHg Patient Gender: M                HR:           96 bpm. Exam Location:  [facility] R[facility] Procedure: 2D Echo, Cardiac Doppler and  Color Doppler Indications:    Dyspnea  History:  Patient has no prior history of Echocardiogram examinations.                 CHF, CAD, PVD; Risk Factors:Hypertension, Diabetes and                 Dyslipidemia. ARF.  Sonographer:    Dustin Flock RDCS Referring Phys: [place]  1. Left ventricular ejection fraction, by visual estimation, is 60 to 65%. The left ventricle has normal function. There is no left ventricular hypertrophy.  2. Left ventricular diastolic parameters are indeterminate.  3. The left ventricle has no regional wall motion abnormalities.  4. Global right ventricle has normal systolic function.The right ventricular size is small. No increase in right ventricular wall thickness.  5. Left atrial size was mildly dilated.  6. Right atrial size was normal.  7. Trivial pericardial effusion is present.  8. Mild to moderate mitral annular calcification.  9. The mitral valve is normal in structure. Trivial mitral valve regurgitation. 10. The tricuspid valve is normal in structure. Tricuspid valve regurgitation is mild. 11. The aortic valve is tricuspid. Aortic valve regurgitation is not visualized. Mild aortic valve sclerosis without stenosis. 12. The pulmonic valve was grossly normal. Pulmonic valve regurgitation is not visualized. 13. The inferior vena cava is dilated in size with >50% respiratory variability, suggesting right atrial pressure of 8 mmHg. FINDINGS  Left Ventricle: Left ventricular ejection fraction, by visual estimation, is 60 to 65%. The left ventricle has normal function. The left ventricle has no regional wall motion abnormalities. There is no left ventricular hypertrophy. Left ventricular diastolic parameters are indeterminate. Right Ventricle: The right ventricular size is small. No increase in right ventricular wall thickness. Global RV systolic function is has normal systolic function. Left Atrium: Left atrial size was mildly dilated. Right Atrium:  Right atrial size was normal in size Pericardium: Trivial pericardial effusion is present. Mitral Valve: The mitral valve is normal in structure. Mild to moderate mitral annular calcification. Trivial mitral valve regurgitation. Tricuspid Valve: The tricuspid valve is normal in structure. Tricuspid valve regurgitation is mild. Aortic Valve: The aortic valve is tricuspid. Aortic valve regurgitation is not visualized. Mild aortic valve sclerosis is present, with no evidence of aortic valve stenosis. Pulmonic Valve: The pulmonic valve was grossly normal. Pulmonic valve regurgitation is not visualized. Pulmonic regurgitation is not visualized. Aorta: The aortic root and ascending aorta are structurally normal, with no evidence of dilitation. Venous: The inferior vena cava is dilated in size with greater than 50% respiratory variability, suggesting right atrial pressure of 8 mmHg. IAS/Shunts: No atrial level shunt detected by color flow Doppler.  LEFT VENTRICLE PLAX 2D LVIDd:         5.28 cm  Diastology LVIDs:         3.29 cm  LV e' lateral:   9.03 cm/s LV PW:         1.10 cm  LV E/e' lateral: 15.3 LV IVS:        1.17 cm  LV e' medial:    8.49 cm/s LVOT diam:     2.10 cm  LV E/e' medial:  16.3 LV SV:         90 ml LV SV Index:   39.52 LVOT Area:     3.46 cm  RIGHT VENTRICLE RV Basal diam:  2.52 cm RV S prime:     11.20 cm/s TAPSE (M-mode): 2.9 cm LEFT ATRIUM  Index       RIGHT ATRIUM           Index LA diam:        5.20 cm 2.32 cm/m  RA Area:     15.70 cm LA Vol (A2C):   45.0 ml 20.08 ml/m RA Volume:   36.20 ml  16.16 ml/m LA Vol (A4C):   75.0 ml 33.47 ml/m LA Biplane Vol: 57.5 ml 25.66 ml/m  AORTIC VALVE LVOT Vmax:   106.00 cm/s LVOT Vmean:  74.400 cm/s LVOT VTI:    0.219 m  AORTA Ao Root diam: 3.00 cm MITRAL VALVE                         TRICUSPID VALVE MV Area (PHT): 5.75 cm              TR Peak grad:   45.6 mmHg MV PHT:        38.28 msec            TR Vmax:        340.00 cm/s MV Decel Time: 132  msec MV E velocity: 138.00 cm/s 103 cm/s  SHUNTS MV A velocity: 129.00 cm/s 70.3 cm/s Systemic VTI:  0.22 m MV E/A ratio:  1.07        1.5       Systemic Diam: 2.10 cm  Buford Dresser MD Electronically signed by Buford Dresser MD Signature Date/Time: 10/18/2019/3:22:11 PM    Final     Micro Results    Recent Results (from the past 240 hour(s))  SARS CORONAVIRUS 2 (TAT 6-24 HRS) Nasopharyngeal Nasopharyngeal Swab     Status: None   Collection Time: 10/18/19 10:30 AM   Specimen: Nasopharyngeal Swab  Result Value Ref Range Status   SARS Coronavirus 2 NEGATIVE NEGATIVE Final    Comment: (NOTE) SARS-CoV-2 target nucleic acids are NOT DETECTED. The SARS-CoV-2 RNA is generally detectable in upper and lower respiratory specimens during the acute phase of infection. Negative results do not preclude SARS-CoV-2 infection, do not rule out co-infections with other pathogens, and should not be used as the sole basis for treatment or other patient management decisions. Negative results must be combined with clinical observations, patient history, and epidemiological information. The expected result is Negative. Fact Sheet for Patients: SugarRoll.be Fact Sheet for Healthcare Providers: https://www.woods-mathews.com/ This test is not yet approved or cleared by the [place] FDA and  has been authorized for detection and/or diagnosis of SARS-CoV-2 by FDA under an Emergency Use Authorization (EUA). This EUA will remain  in effect (meaning this test can be used) for the duration of the COVID-19 declaration under Section 56 4(b)(1) of the Act, 21 U.S.C. section 360bbb-3(b)(1), unless the authorization is terminated or revoked sooner. Performed at [facility], [place] [address] [place],  [address]      Today   Subjective    Edward Rocha today has no new complaints         -Ambulated patient about 150 feet O2 sats dropped from  90% to 96% post ambulation -No significant dyspnea on exertion   Patient has been seen and examined prior to discharge   Objective   Blood pressure 125/73, pulse (!) 103, temperature 98.7 F (37.1 C), temperature source Oral, resp. rate 18, height 6\' 1"  (1.854 m), weight (!) 201.9 kg, SpO2 98 %.   Intake/Output Summary (Last 24 hours) at 10/19/2019 1531 Last data filed at 10/19/2019 1300 Gross per  24 hour  Intake 723 ml  Output 800 ml  Net -77 ml    Exam Physical Examination: General appearance - alert, well appearing, and in no distress Mental status - alert, oriented to person, place, and time,  Eyes - sclera anicteric Neck - supple, no JVD elevation , right IJ HD catheter Chest -improved air movement, no wheezing or rales heart - S1 and S2 normal, regular  Abdomen - soft, nontender, nondistended, no masses or organomegaly Neurological - screening mental status exam normal, neck supple without rigidity, cranial nerves II through XII intact, DTR's normal and symmetric Extremities - 1+ pedal edema noted, intact peripheral pulses  -Right upper extremity swelling Skin - warm, dry    Data Review   CBC w Diff:  Lab Results  Component Value Date   WBC 7.4 10/19/2019   HGB 8.5 (L) 10/19/2019   HCT 26.5 (L) 10/19/2019   PLT 66 (L) 10/19/2019   LYMPHOPCT 19 08/26/2019   MONOPCT 12 08/26/2019   EOSPCT 2 08/26/2019   BASOPCT 0 08/26/2019    CMP:  Lab Results  Component Value Date   Rocha 132 (L) 10/19/2019   K 4.4 10/19/2019   CL 102 10/19/2019   CO2 21 (L) 10/19/2019   BUN 39 (H) 10/19/2019   CREATININE 1.63 (H) 10/19/2019   PROT 6.0 (L) 09/21/2019   ALBUMIN 1.9 (L) 09/21/2019   BILITOT 1.8 (H) 09/21/2019   ALKPHOS 69 09/21/2019   AST 34 09/21/2019   ALT 19 09/21/2019  .   Total Discharge time is about 33 minutes  Roxan Hockey M.D on 10/19/2019 at 3:31 PM  Go to [email or web address] -  for contact info  [facility] - Office  [phone]

## 2019-10-19 NOTE — Discharge Instructions (Signed)
1)Very low-salt diet advised 2)Weigh yourself daily, call if you gain more than 3 pounds in 1 day or more than 5 pounds in 1 week as your diuretic medications may need to be adjusted 3)Limit your Fluid  intake to no more than 60 ounces (1.8 Liters) per day 4)Avoid ibuprofen/Advil/Aleve/Motrin/Goody Powders/Naproxen/BC powders/Meloxicam/Diclofenac/Indomethacin and other Nonsteroidal anti-inflammatory medications as these will make you more likely to bleed and can cause stomach ulcers, can also cause Kidney problems.  5)STOP Empagliflozin (JARDIANCE) 6)Stop Glipizide 7) please repeat CBC and BMP blood test with the primary care physician around Wednesday, 10/22/2019 8) stop chlorthalidone 9) stop furosemide/Lasix 10) please take Demadex/torsemide as prescribed in place of Lasix/furosemide

## 2019-10-19 NOTE — Plan of Care (Signed)

## 2019-11-04 ENCOUNTER — Encounter: Payer: Managed Care, Other (non HMO) | Admitting: Vascular Surgery

## 2019-11-04 ENCOUNTER — Encounter (HOSPITAL_COMMUNITY): Payer: Managed Care, Other (non HMO)

## 2019-12-03 ENCOUNTER — Encounter: Payer: Self-pay | Admitting: Surgery

## 2020-04-14 ENCOUNTER — Encounter: Payer: Self-pay | Admitting: Surgery

## 2020-09-02 IMAGING — CT CT HEAD W/O CM
4 series · 16 of 47 positions shown, 18 images · non-contrast
Comparison: None.

CLINICAL DATA: Encephalopathy.  Seizure like activity

EXAM:
CT HEAD WITHOUT CONTRAST
TECHNIQUE: Contiguous axial images were obtained from the base of the skull
through the vertex without intravenous contrast.

[Series 3: head wo · axial · 0.47mm/px · z∈[-120,+0]mm · 7 of 33 slices shown, 9 images]
[im 5/33  brain]
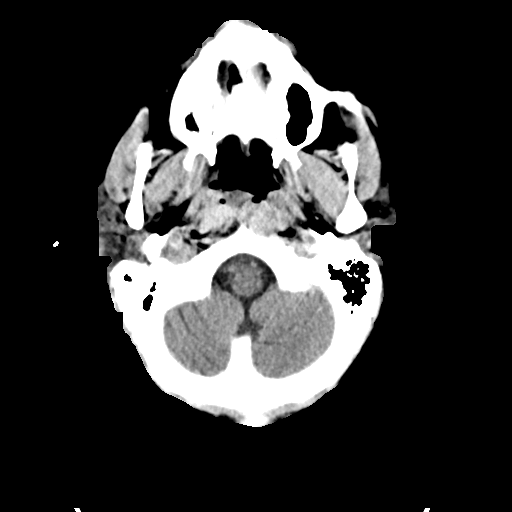
[im 5/33  bone]
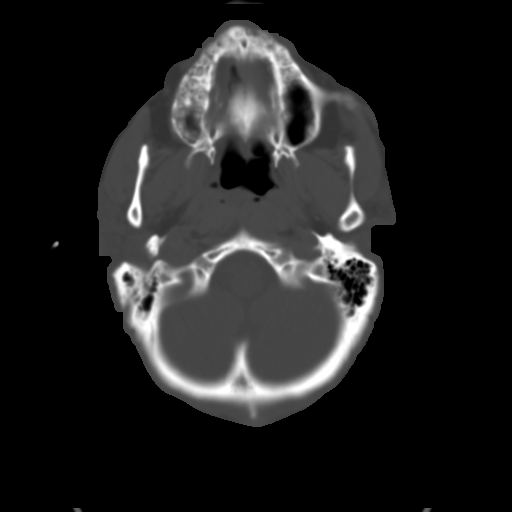
[im 9/33  brain]
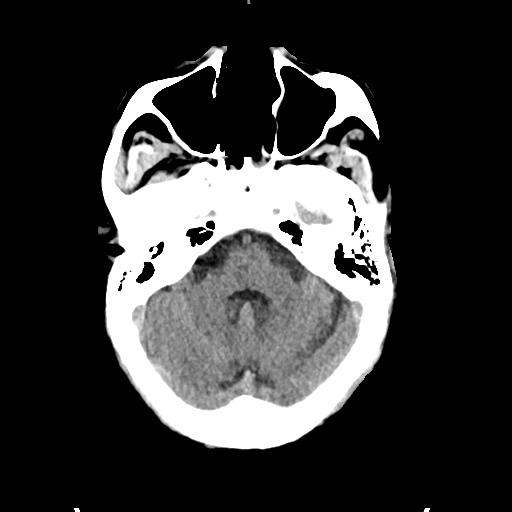
[im 13/33  brain]
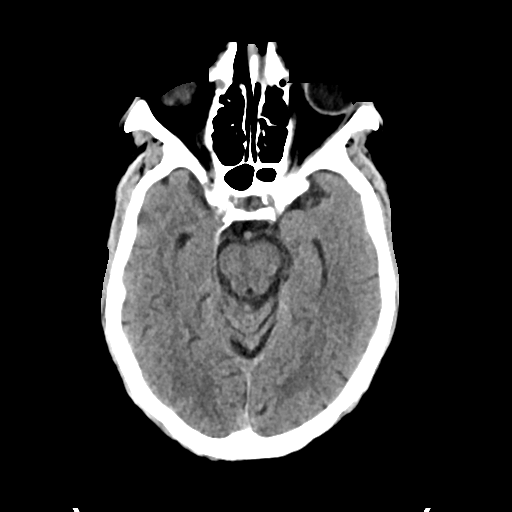
[im 17/33  brain]
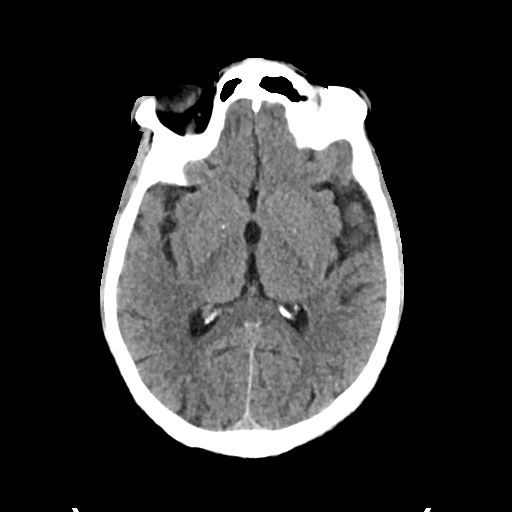
[im 21/33  brain]
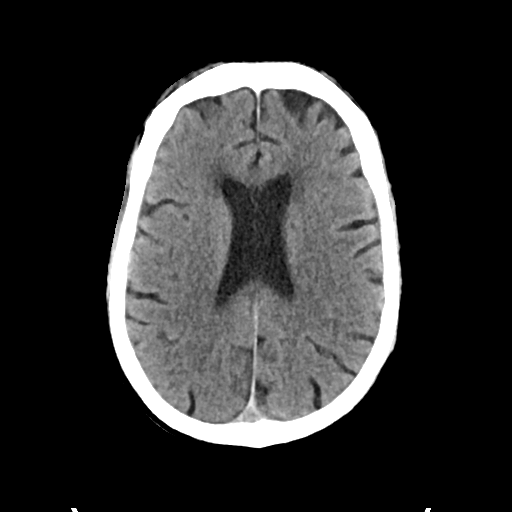
[im 21/33  bone]
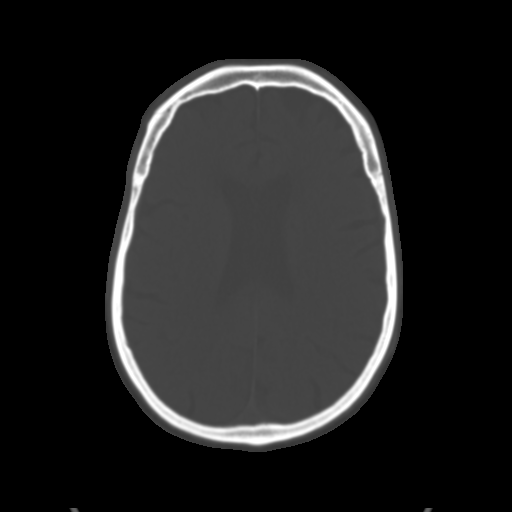
[im 25/33  brain]
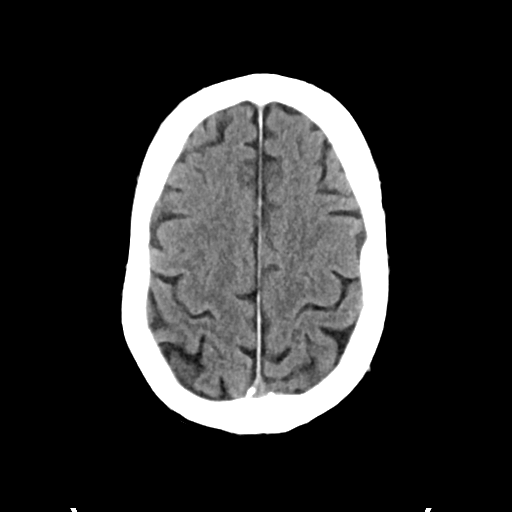
[im 29/33  brain]
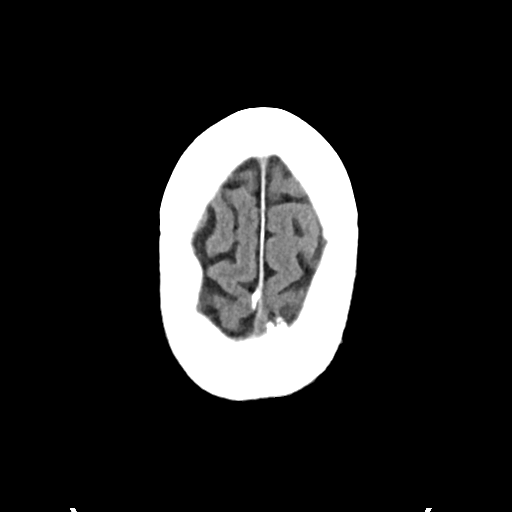

[Series 4: head bone · axial · 0.47mm/px · z∈[-124,-92]mm · 3 of 81 slices shown]
[im 9/81  bone]
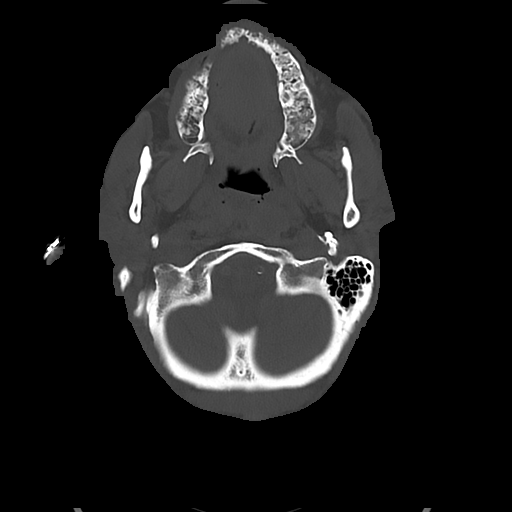
[im 17/81  bone]
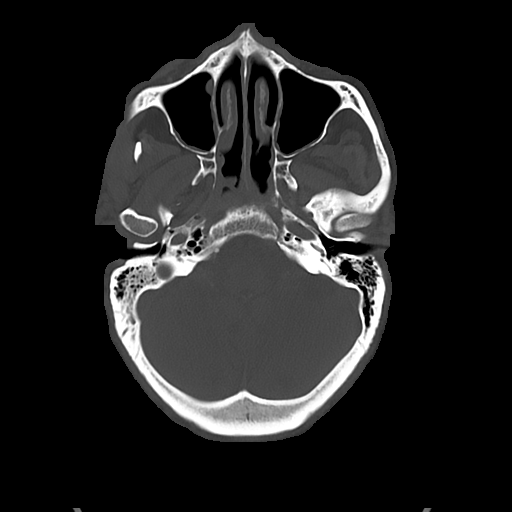
[im 25/81  bone]
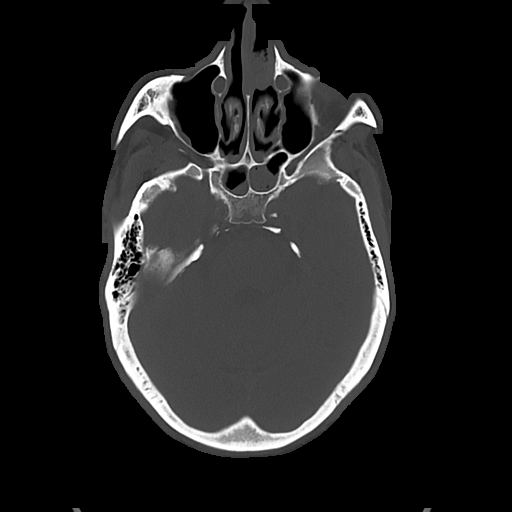

[Series 5: cor soft · coronal · 0.33mm/px · 3 of 72 slices shown]
[im 24/72  brain]
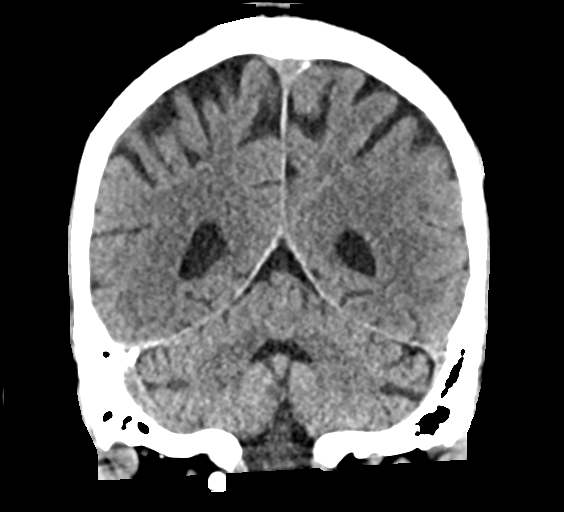
[im 32/72  brain]
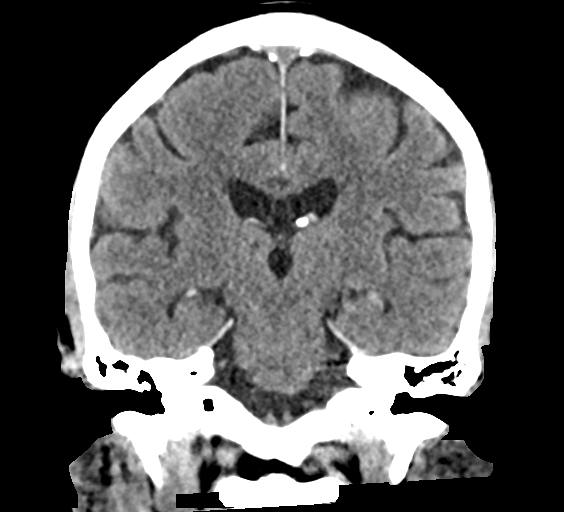
[im 40/72  brain]
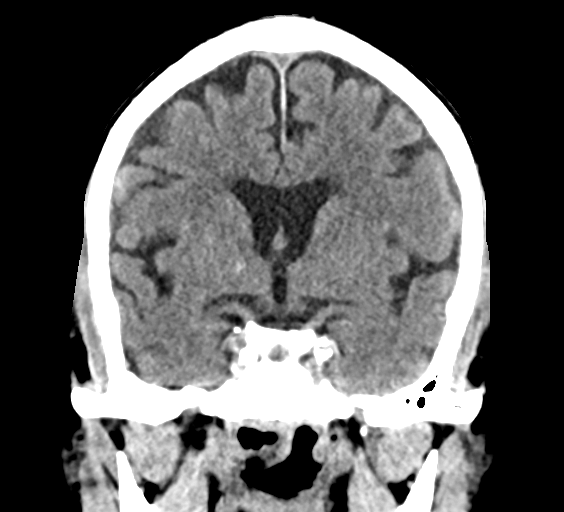

[Series 6: sag soft · sagittal · 0.33mm/px · 3 of 61 slices shown]
[im 21/61  brain]
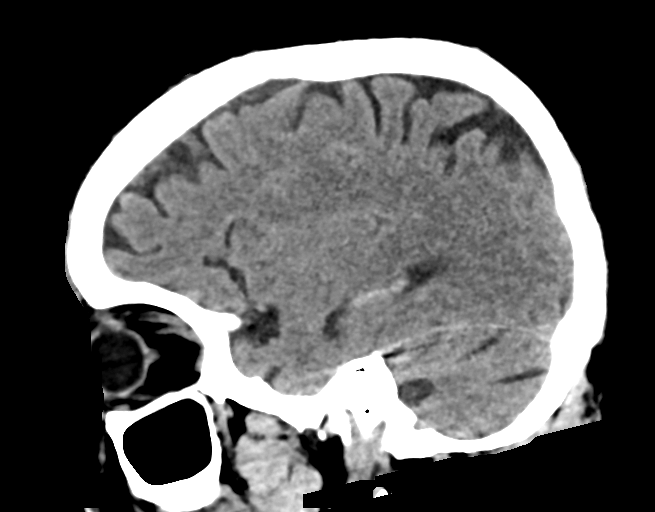
[im 31/61  brain]
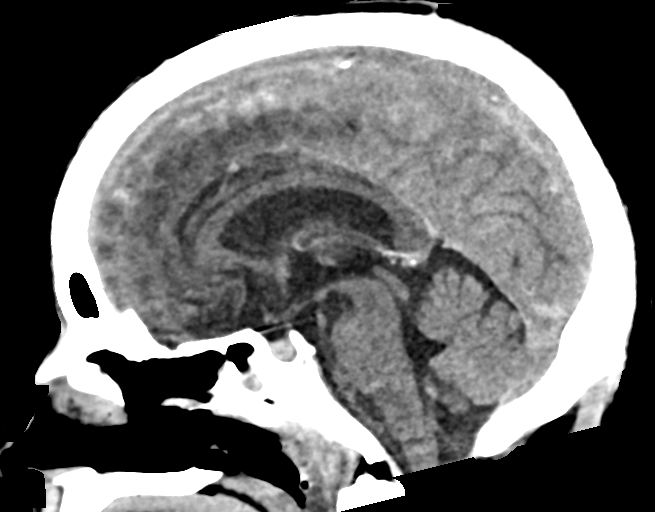
[im 41/61  brain]
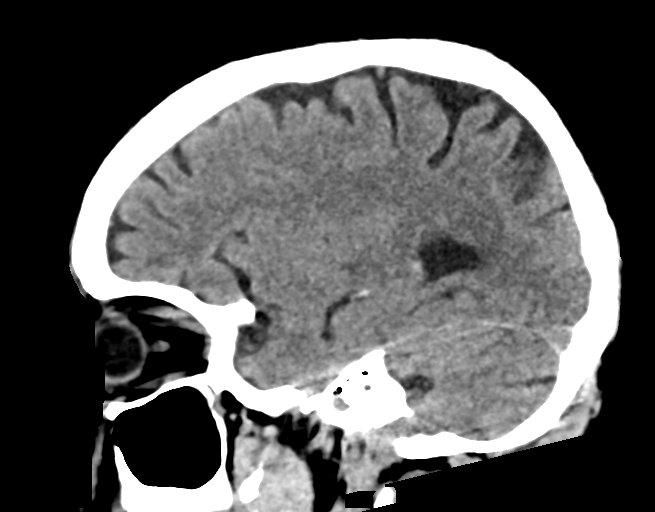

[16 of 47 positions shown; findings below may reference images not displayed]

FINDINGS: Brain: No evidence of acute infarction, hemorrhage, hydrocephalus,
extra-axial collection or mass lesion/mass effect.

Mild atrophy and mild white matter changes most likely chronic
microvascular ischemia.

Vascular: Negative for hyperdense vessel

Skull: Negative

Sinuses/Orbits: Patient is intubated. Mild mucosal edema paranasal
sinuses. Negative orbit

Other: None
IMPRESSION: No acute intracranial abnormality.

## 2021-07-04 DIAGNOSIS — I517 Cardiomegaly: Secondary | ICD-10-CM | POA: Diagnosis not present

## 2021-07-04 DIAGNOSIS — J449 Chronic obstructive pulmonary disease, unspecified: Secondary | ICD-10-CM | POA: Diagnosis not present

## 2021-07-04 DIAGNOSIS — I5033 Acute on chronic diastolic (congestive) heart failure: Secondary | ICD-10-CM | POA: Diagnosis not present

## 2021-07-04 DIAGNOSIS — I214 Non-ST elevation (NSTEMI) myocardial infarction: Secondary | ICD-10-CM | POA: Diagnosis not present

## 2021-07-04 DIAGNOSIS — R000 Tachycardia, unspecified: Secondary | ICD-10-CM | POA: Diagnosis not present

## 2021-07-04 DIAGNOSIS — R778 Other specified abnormalities of plasma proteins: Secondary | ICD-10-CM | POA: Diagnosis not present

## 2021-07-04 DIAGNOSIS — I10 Essential (primary) hypertension: Secondary | ICD-10-CM | POA: Diagnosis not present

## 2021-07-04 DIAGNOSIS — J9811 Atelectasis: Secondary | ICD-10-CM | POA: Diagnosis not present

## 2021-07-04 DIAGNOSIS — Z20822 Contact with and (suspected) exposure to covid-19: Secondary | ICD-10-CM | POA: Diagnosis not present

## 2021-07-04 DIAGNOSIS — R9431 Abnormal electrocardiogram [ECG] [EKG]: Secondary | ICD-10-CM | POA: Diagnosis not present

## 2021-07-04 DIAGNOSIS — E119 Type 2 diabetes mellitus without complications: Secondary | ICD-10-CM | POA: Diagnosis not present

## 2021-07-04 DIAGNOSIS — N289 Disorder of kidney and ureter, unspecified: Secondary | ICD-10-CM | POA: Diagnosis not present

## 2021-07-04 DIAGNOSIS — I110 Hypertensive heart disease with heart failure: Secondary | ICD-10-CM | POA: Diagnosis not present

## 2021-07-04 DIAGNOSIS — R0602 Shortness of breath: Secondary | ICD-10-CM | POA: Diagnosis not present

## 2021-07-05 DIAGNOSIS — K219 Gastro-esophageal reflux disease without esophagitis: Secondary | ICD-10-CM | POA: Diagnosis not present

## 2021-07-05 DIAGNOSIS — Z888 Allergy status to other drugs, medicaments and biological substances status: Secondary | ICD-10-CM | POA: Diagnosis not present

## 2021-07-05 DIAGNOSIS — Z95828 Presence of other vascular implants and grafts: Secondary | ICD-10-CM | POA: Diagnosis not present

## 2021-07-05 DIAGNOSIS — I739 Peripheral vascular disease, unspecified: Secondary | ICD-10-CM | POA: Diagnosis not present

## 2021-07-05 DIAGNOSIS — E1151 Type 2 diabetes mellitus with diabetic peripheral angiopathy without gangrene: Secondary | ICD-10-CM | POA: Diagnosis not present

## 2021-07-05 DIAGNOSIS — Z87891 Personal history of nicotine dependence: Secondary | ICD-10-CM | POA: Diagnosis not present

## 2021-07-05 DIAGNOSIS — J449 Chronic obstructive pulmonary disease, unspecified: Secondary | ICD-10-CM | POA: Diagnosis not present

## 2021-07-05 DIAGNOSIS — I340 Nonrheumatic mitral (valve) insufficiency: Secondary | ICD-10-CM | POA: Diagnosis not present

## 2021-07-05 DIAGNOSIS — K7460 Unspecified cirrhosis of liver: Secondary | ICD-10-CM | POA: Diagnosis not present

## 2021-07-05 DIAGNOSIS — N289 Disorder of kidney and ureter, unspecified: Secondary | ICD-10-CM | POA: Diagnosis not present

## 2021-07-05 DIAGNOSIS — R9431 Abnormal electrocardiogram [ECG] [EKG]: Secondary | ICD-10-CM | POA: Diagnosis not present

## 2021-07-05 DIAGNOSIS — I517 Cardiomegaly: Secondary | ICD-10-CM | POA: Diagnosis not present

## 2021-07-05 DIAGNOSIS — I361 Nonrheumatic tricuspid (valve) insufficiency: Secondary | ICD-10-CM | POA: Diagnosis not present

## 2021-07-05 DIAGNOSIS — N183 Chronic kidney disease, stage 3 unspecified: Secondary | ICD-10-CM | POA: Diagnosis not present

## 2021-07-05 DIAGNOSIS — E785 Hyperlipidemia, unspecified: Secondary | ICD-10-CM | POA: Diagnosis not present

## 2021-07-05 DIAGNOSIS — J9811 Atelectasis: Secondary | ICD-10-CM | POA: Diagnosis not present

## 2021-07-05 DIAGNOSIS — Z20822 Contact with and (suspected) exposure to covid-19: Secondary | ICD-10-CM | POA: Diagnosis not present

## 2021-07-05 DIAGNOSIS — I5189 Other ill-defined heart diseases: Secondary | ICD-10-CM | POA: Diagnosis not present

## 2021-07-05 DIAGNOSIS — E039 Hypothyroidism, unspecified: Secondary | ICD-10-CM | POA: Diagnosis not present

## 2021-07-05 DIAGNOSIS — I428 Other cardiomyopathies: Secondary | ICD-10-CM | POA: Diagnosis not present

## 2021-07-05 DIAGNOSIS — I5043 Acute on chronic combined systolic (congestive) and diastolic (congestive) heart failure: Secondary | ICD-10-CM | POA: Diagnosis not present

## 2021-07-05 DIAGNOSIS — J9601 Acute respiratory failure with hypoxia: Secondary | ICD-10-CM | POA: Diagnosis not present

## 2021-07-05 DIAGNOSIS — I110 Hypertensive heart disease with heart failure: Secondary | ICD-10-CM | POA: Diagnosis not present

## 2021-07-05 DIAGNOSIS — Z636 Dependent relative needing care at home: Secondary | ICD-10-CM | POA: Diagnosis not present

## 2021-07-05 DIAGNOSIS — I214 Non-ST elevation (NSTEMI) myocardial infarction: Secondary | ICD-10-CM | POA: Diagnosis not present

## 2021-07-05 DIAGNOSIS — I2510 Atherosclerotic heart disease of native coronary artery without angina pectoris: Secondary | ICD-10-CM | POA: Diagnosis not present

## 2021-07-05 DIAGNOSIS — R0602 Shortness of breath: Secondary | ICD-10-CM | POA: Diagnosis not present

## 2021-07-05 DIAGNOSIS — I10 Essential (primary) hypertension: Secondary | ICD-10-CM | POA: Diagnosis not present

## 2021-07-05 DIAGNOSIS — I5033 Acute on chronic diastolic (congestive) heart failure: Secondary | ICD-10-CM | POA: Diagnosis not present

## 2021-07-05 DIAGNOSIS — I130 Hypertensive heart and chronic kidney disease with heart failure and stage 1 through stage 4 chronic kidney disease, or unspecified chronic kidney disease: Secondary | ICD-10-CM | POA: Diagnosis not present

## 2021-07-05 DIAGNOSIS — R778 Other specified abnormalities of plasma proteins: Secondary | ICD-10-CM | POA: Diagnosis not present

## 2021-07-05 DIAGNOSIS — E1169 Type 2 diabetes mellitus with other specified complication: Secondary | ICD-10-CM | POA: Diagnosis not present

## 2021-07-05 DIAGNOSIS — Z91014 Allergy to mammalian meats: Secondary | ICD-10-CM | POA: Diagnosis not present

## 2021-07-05 DIAGNOSIS — R000 Tachycardia, unspecified: Secondary | ICD-10-CM | POA: Diagnosis not present

## 2021-07-05 DIAGNOSIS — E1122 Type 2 diabetes mellitus with diabetic chronic kidney disease: Secondary | ICD-10-CM | POA: Diagnosis not present

## 2021-07-05 DIAGNOSIS — E119 Type 2 diabetes mellitus without complications: Secondary | ICD-10-CM | POA: Diagnosis not present

## 2021-07-05 DIAGNOSIS — Z79899 Other long term (current) drug therapy: Secondary | ICD-10-CM | POA: Diagnosis not present

## 2021-07-05 DIAGNOSIS — I083 Combined rheumatic disorders of mitral, aortic and tricuspid valves: Secondary | ICD-10-CM | POA: Diagnosis not present

## 2021-07-05 DIAGNOSIS — I509 Heart failure, unspecified: Secondary | ICD-10-CM | POA: Diagnosis not present

## 2021-07-17 ENCOUNTER — Encounter (HOSPITAL_COMMUNITY): Payer: Self-pay | Admitting: *Deleted

## 2021-07-17 ENCOUNTER — Emergency Department (HOSPITAL_COMMUNITY)
Admission: EM | Admit: 2021-07-17 | Discharge: 2021-07-18 | Disposition: A | Discharge status: Home / Self Care | Payer: Medicare Other | Attending: Emergency Medicine | Admitting: Emergency Medicine

## 2021-07-17 ENCOUNTER — Emergency Department (HOSPITAL_COMMUNITY): Payer: Medicare Other

## 2021-07-17 ENCOUNTER — Other Ambulatory Visit: Payer: Self-pay

## 2021-07-17 DIAGNOSIS — E039 Hypothyroidism, unspecified: Secondary | ICD-10-CM | POA: Insufficient documentation

## 2021-07-17 DIAGNOSIS — Z20822 Contact with and (suspected) exposure to covid-19: Secondary | ICD-10-CM | POA: Insufficient documentation

## 2021-07-17 DIAGNOSIS — Z87891 Personal history of nicotine dependence: Secondary | ICD-10-CM | POA: Diagnosis not present

## 2021-07-17 DIAGNOSIS — Z7982 Long term (current) use of aspirin: Secondary | ICD-10-CM | POA: Diagnosis not present

## 2021-07-17 DIAGNOSIS — J9811 Atelectasis: Secondary | ICD-10-CM | POA: Diagnosis not present

## 2021-07-17 DIAGNOSIS — I2510 Atherosclerotic heart disease of native coronary artery without angina pectoris: Secondary | ICD-10-CM | POA: Diagnosis not present

## 2021-07-17 DIAGNOSIS — Z79899 Other long term (current) drug therapy: Secondary | ICD-10-CM | POA: Insufficient documentation

## 2021-07-17 DIAGNOSIS — R0602 Shortness of breath: Secondary | ICD-10-CM | POA: Insufficient documentation

## 2021-07-17 DIAGNOSIS — R2240 Localized swelling, mass and lump, unspecified lower limb: Secondary | ICD-10-CM | POA: Diagnosis not present

## 2021-07-17 DIAGNOSIS — J90 Pleural effusion, not elsewhere classified: Secondary | ICD-10-CM | POA: Diagnosis not present

## 2021-07-17 DIAGNOSIS — R079 Chest pain, unspecified: Secondary | ICD-10-CM | POA: Diagnosis not present

## 2021-07-17 DIAGNOSIS — I110 Hypertensive heart disease with heart failure: Secondary | ICD-10-CM | POA: Insufficient documentation

## 2021-07-17 DIAGNOSIS — Z8679 Personal history of other diseases of the circulatory system: Secondary | ICD-10-CM | POA: Insufficient documentation

## 2021-07-17 DIAGNOSIS — I509 Heart failure, unspecified: Secondary | ICD-10-CM | POA: Insufficient documentation

## 2021-07-17 DIAGNOSIS — R000 Tachycardia, unspecified: Secondary | ICD-10-CM | POA: Diagnosis not present

## 2021-07-17 DIAGNOSIS — E119 Type 2 diabetes mellitus without complications: Secondary | ICD-10-CM | POA: Diagnosis not present

## 2021-07-17 HISTORY — DX: Acute myocardial infarction, unspecified: I21.9

## 2021-07-17 LAB — CBC
HCT: 41.5 % (ref 39.0–52.0)
Hemoglobin: 13.2 g/dL (ref 13.0–17.0)
MCH: 26.9 pg (ref 26.0–34.0)
MCHC: 31.8 g/dL (ref 30.0–36.0)
MCV: 84.7 fL (ref 80.0–100.0)
Platelets: 110 10*3/uL — ABNORMAL LOW (ref 150–400)
RBC: 4.9 MIL/uL (ref 4.22–5.81)
RDW: 13.4 % (ref 11.5–15.5)
WBC: 4.9 10*3/uL (ref 4.0–10.5)
nRBC: 0 % (ref 0.0–0.2)

## 2021-07-17 LAB — BASIC METABOLIC PANEL
Anion gap: 8 (ref 5–15)
BUN: 15 mg/dL (ref 6–20)
CO2: 22 mmol/L (ref 22–32)
Calcium: 8.5 mg/dL — ABNORMAL LOW (ref 8.9–10.3)
Chloride: 105 mmol/L (ref 98–111)
Creatinine, Ser: 1.05 mg/dL (ref 0.61–1.24)
GFR, Estimated: >60 mL/min (ref >60)
Glucose, Bld: 99 mg/dL (ref 70–99)
Potassium: 3.5 mmol/L (ref 3.5–5.1)
Sodium: 135 mmol/L (ref 135–145)

## 2021-07-17 LAB — TROPONIN I (HIGH SENSITIVITY)
Troponin I (High Sensitivity): 16 ng/L (ref <18)
Troponin I (High Sensitivity): 16 ng/L (ref <18)

## 2021-07-17 LAB — RESP PANEL BY RT-PCR (FLU A&B, COVID) ARPGX2
Influenza A by PCR: NEGATIVE
Influenza B by PCR: NEGATIVE
SARS Coronavirus 2 by RT PCR: NEGATIVE

## 2021-07-17 LAB — BRAIN NATRIURETIC PEPTIDE: B Natriuretic Peptide: 1662 pg/mL — ABNORMAL HIGH (ref 0.0–100.0)

## 2021-07-17 MED ORDER — FUROSEMIDE 10 MG/ML IJ SOLN
40.0000 mg | Freq: Once | INTRAMUSCULAR | Status: AC
  Administered 2021-07-17: 40 mg via INTRAVENOUS
  Filled 2021-07-17: qty 4

## 2021-07-17 MED ORDER — IOHEXOL 350 MG/ML SOLN
75.0000 mL | Freq: Once | INTRAVENOUS | Status: AC | PRN
  Administered 2021-07-17: 75 mL via INTRAVENOUS

## 2021-07-17 MED ORDER — FUROSEMIDE 20 MG PO TABS: 20.0000 mg | ORAL_TABLET | Freq: Every day | ORAL | 0 refills | Status: DC

## 2021-07-17 NOTE — ED Provider Notes (Signed)
[facility] EMERGENCY DEPARTMENT Provider Note   CSN: [id number] Arrival date & time: 07/17/21  1634     History Chief Complaint  Patient presents with   Shortness of Breath    Edward Rocha. is a 55 y.o. male.  HPI  55 year old male who is chronically ill with past medical history of HTN, HLD, DM, peripheral vascular disease, CAD with previous MI, recent admission with NSTEMI secondary to CHF presents the emergency department with worsening shortness of breath.  Patient states he feels short of breath at baseline but it is severe with exertion.  He was recently admitted with the same symptoms where he was found to be in heart failure.  He was discharged with the addition of metoprolol.  Currently does not take a diuretic.  Was not discharged home on home oxygen, normally does not have an oxygen requirement.  Today he states that the exertional shortness of breath continues and is worse.  Denies any chest pain or cough.  No recent fever.  He also endorses swelling in his lower extremities which is worse in the left leg.  Denies any GI symptoms.  Scheduled to have outpatient cardiology follow-up.  Past Medical History:  Diagnosis Date   Acute renal failure (ARF) ([place])    12/2018 when admitted for hyperosmolar hyperglycemic nonketotic syndrome   Bilateral pain of leg and foot    CHF (congestive heart failure) (HCC)    Coronary artery disease    Diabetes mellitus without complication (HCC)    Type II   Dizziness    Elevated LFTs 05/2019   Frequent urination    GERD (gastroesophageal reflux disease)    Hyperglycemia    Non-Ketotic Hyperosmolar.   Hyperlipidemia    Hypernatremia    Hypertension    MI (myocardial infarction) ([place])    Peripheral vascular disease ([place])    Syncope    12/2018, diagnosed with hyperosmolar hyperglycemic nonketotic syndrome, AKI, hyponatremia ([facility])   Thrombocytopenia (HCC)    Thyroid disease    Hypothyroidism   Weakness     Patient  Active Problem List   Diagnosis Date Noted   Angioedema 10/18/2019   Acute heart failure with preserved ejection fraction (HFpEF) ([place]) 10/18/2019   Cirrhosis of liver ([place]) 10/18/2019   Acute exacerbation of CHF (congestive heart failure) ([place]) 10/18/2019   Altered mental status 09/21/2019   Pressure injury of skin 08/27/2019   Acute respiratory failure (HCC)    Aspiration into airway    Acute encephalopathy    Gastrointestinal hemorrhage    Endotracheal tube present    Nasogastric tube present    PAD (peripheral artery disease) ([place]) 08/11/2019   Thrombocytopenia ([place]) 05/29/2019   Diabetes ([place]) 05/29/2019   Critical lower limb ischemia ([place]) 04/22/2019    Past Surgical History:  Procedure Laterality Date   ABDOMINAL AORTOGRAM W/LOWER EXTREMITY N/A 02/06/2019   Procedure: ABDOMINAL AORTOGRAM W/LOWER EXTREMITY;  Surgeon: [name], MD;  Location: [place] [facility];  Service: Cardiovascular;  Laterality: N/A;  bilateral   ABDOMINAL AORTOGRAM W/LOWER EXTREMITY N/A 04/30/2019   Procedure: ABDOMINAL AORTOGRAM W/LOWER EXTREMITY;  Surgeon: [name], MD;  Location: [place] [facility];  Service: Cardiovascular;  Laterality: N/A;   AORTOGRAM  08/11/2019   Procedure: Aortogram;  Surgeon: [name], MD;  Location: [place];  Service: Vascular;;   ESOPHAGEAL BANDING  08/22/2019   Procedure: ESOPHAGEAL BANDING;  Surgeon: Milus Banister, MD;  Location: [facility] ENDOSCOPY;  Service: Endoscopy;;   ESOPHAGOGASTRODUODENOSCOPY N/A  08/22/2019   Procedure: ESOPHAGOGASTRODUODENOSCOPY (EGD);  Surgeon: Milus Banister, MD;  Location: [facility] ENDOSCOPY;  Service: Endoscopy;  Laterality: N/A;   FEMORAL-POPLITEAL BYPASS GRAFT Left 08/11/2019   Procedure: BYPASS LEFT FEMORAL-DISTAL POPLITEAL ARTERY USING PROPATEN GRAFT;  Surgeon: [name], MD;  Location: [place];  Service: Vascular;  Laterality: Left;   INSERTION OF DIALYSIS CATHETER Right 09/09/2019   Procedure: INSERTION OF  DIALYSIS CATHETER;  Surgeon: Waynetta Sandy, MD;  Location: [place];  Service: Vascular;  Laterality: Right;   MULTIPLE TOOTH EXTRACTIONS     PERIPHERAL VASCULAR INTERVENTION  02/06/2019   Procedure: PERIPHERAL VASCULAR INTERVENTION;  Surgeon: [name], MD;  Location: [place] [facility];  Service: Cardiovascular;;  Bilateral Iliacs   PERIPHERAL VASCULAR INTERVENTION  04/30/2019   Procedure: PERIPHERAL VASCULAR INTERVENTION;  Surgeon: [name], MD;  Location: [place] [facility];  Service: Cardiovascular;;  Stent - Lt. Iliac    REMOVAL OF A DIALYSIS CATHETER Right 09/09/2019   Procedure: Removal Of A Dialysis Catheter;  Surgeon: Waynetta Sandy, MD;  Location: [place];  Service: Vascular;  Laterality: Right;   ULTRASOUND GUIDANCE FOR VASCULAR ACCESS Right 09/09/2019   Procedure: Ultrasound Guidance For Vascular Access;  Surgeon: Waynetta Sandy, MD;  Location: [place];  Service: Vascular;  Laterality: Right;   VASCULAR SURGERY         Family History  Problem Relation Age of Onset   Alcohol abuse Father    Cancer Mother    Alcohol abuse Brother    Bipolar disorder Son    Bipolar disorder Daughter     Social History   Tobacco Use   Smoking status: Former    Packs/day: 1.00    Years: 36.00    Pack years: 36.00    Types: Cigarettes    Quit date: 01/25/2019    Years since quitting: 2.4   Smokeless tobacco: Never  Vaping Use   Vaping Use: Never used  Substance Use Topics   Alcohol use: Not Currently    Comment: per pt's wife, pt is an alcoholic but just recently stopped drinking in 04/2019   Drug use: Never    Home Medications Prior to Admission medications   Medication Sig Start Date End Date Taking? Authorizing Provider  acetaminophen (TYLENOL) 500 MG tablet Take 1,000 mg by mouth daily as needed for moderate pain or headache.   Yes [provider]  aspirin EC 81 MG tablet Take 81 mg by mouth daily. Swallow whole.   Yes  [provider]  atorvastatin (LIPITOR) 40 MG tablet Take 40 mg by mouth daily.   Yes [provider]  gabapentin (NEURONTIN) 600 MG tablet Take 600 mg by mouth 2 (two) times daily.   Yes [provider]  losartan (COZAAR) 25 MG tablet Take 25 mg by mouth daily.   Yes [provider]  metoprolol succinate (TOPROL-XL) 25 MG 24 hr tablet Take 25 mg by mouth daily.   Yes [provider]  nitroGLYCERIN (NITROSTAT) 0.4 MG SL tablet Place 0.4 mg under the tongue every 5 (five) minutes as needed for chest pain.   Yes [provider]  lactulose (CHRONULAC) 10 GM/15ML solution Take 15 mLs (10 g total) by mouth daily. Patient not taking: Reported on 07/17/2021 10/19/19   Roxan Hockey, MD  NIFEdipine (ADALAT CC) 30 MG 24 hr tablet Take 1 tablet (30 mg total) by mouth daily. 09/24/19 10/24/19  Manuella Ghazi, Pratik D, DO  oxyCODONE-acetaminophen (PERCOCET/ROXICET) 5-325 MG tablet  Take 1 tablet by mouth every 6 (six) hours as needed. Patient not taking: Reported on 07/17/2021 09/10/19   Ulyses Amor, PA-C  potassium chloride (KLOR-CON) 10 MEQ tablet Take 1 tablet (10 mEq total) by mouth daily. 09/23/19 10/23/19  Manuella Ghazi, Pratik D, DO  ranitidine (ZANTAC) 300 MG tablet Take 1 tablet (300 mg total) by mouth at bedtime. 10/19/19 10/18/20  Roxan Hockey, MD  torsemide (DEMADEX) 20 MG tablet Take 1 tablet (20 mg total) by mouth See admin instructions. Take 1 tablet (20 mg) every morning and half a tablet ('10mg'$ ) early in the evening every day 10/19/19   Roxan Hockey, MD    Allergies    Other  Review of Systems   Review of Systems  Constitutional:  Positive for diaphoresis and fatigue. Negative for chills and fever.  HENT:  Negative for congestion.   Eyes:  Negative for visual disturbance.  Respiratory:  Positive for shortness of breath. Negative for chest tightness.   Cardiovascular:  Positive for leg swelling. Negative for chest pain and palpitations.   Gastrointestinal:  Negative for abdominal pain, diarrhea and vomiting.  Genitourinary:  Negative for dysuria.  Skin:  Negative for rash.  Neurological:  Negative for headaches.   Physical Exam Updated Vital Signs BP (!) 135/93   Pulse 87   Temp 97.7 F (36.5 C) (Oral)   Resp (!) 33   Ht '6\' 1"'$  (1.854 m)   Wt 85.1 kg   SpO2 99%   BMI 24.76 kg/m   Physical Exam Vitals and nursing note reviewed.  Constitutional:      Appearance: Normal appearance. He is ill-appearing.  HENT:     Head: Normocephalic.     Mouth/Throat:     Mouth: Mucous membranes are moist.  Cardiovascular:     Rate and Rhythm: Tachycardia present.  Pulmonary:     Effort: Pulmonary effort is normal. No respiratory distress.     Breath sounds: Examination of the right-middle field reveals rales. Examination of the left-middle field reveals rales. Examination of the right-lower field reveals decreased breath sounds. Examination of the left-lower field reveals decreased breath sounds. Decreased breath sounds and rales present.  Chest:     Chest wall: No crepitus.  Abdominal:     Palpations: Abdomen is soft.     Tenderness: There is no abdominal tenderness.  Musculoskeletal:     Right lower leg: Edema present.     Left lower leg: Edema present.     Comments: Bilateral lower extremity edema worse on the left  Skin:    General: Skin is warm.  Neurological:     Mental Status: He is alert and oriented to person, place, and time. Mental status is at baseline.  Psychiatric:        Mood and Affect: Mood normal.    ED Results / Procedures / Treatments   Labs (all labs ordered are listed, but only abnormal results are displayed) Labs Reviewed  BASIC METABOLIC PANEL - Abnormal; Notable for the following components:      Result Value   Calcium 8.5 (*)    All other components within normal limits  CBC - Abnormal; Notable for the following components:   Platelets 110 (*)    All other components within normal  limits  BRAIN NATRIURETIC PEPTIDE  TROPONIN I (HIGH SENSITIVITY)  TROPONIN I (HIGH SENSITIVITY)    EKG EKG Interpretation  Date/Time:  Sunday July 17 2021 16:57:49 EDT Ventricular Rate:  111 PR Interval:  166 QRS Duration: 107  QT Interval:  352 QTC Calculation: 479 R Axis:   71 Text Interpretation: Sinus tachycardia Left atrial enlargement LVH with secondary repolarization abnormality Anterior Q waves, possibly due to LVH Sinus tach, STD v5-6 new, no STEMI Confirmed by Lavenia Atlas [phone]) on 07/17/2021 5:03:41 PM  Radiology DG Chest Portable 1 View  Result Date: 07/17/2021 CLINICAL DATA:  Shortness of breath and chest pain. EXAM: PORTABLE CHEST 1 VIEW COMPARISON:  Chest x-[place] 07/04/2021 FINDINGS: The heart and mediastinal contours are unchanged. Aortic calcification. No focal consolidation. Slightly increased interstitial markings. No pleural effusion. No pneumothorax. No acute osseous abnormality. IMPRESSION: Possible mild pulmonary edema. Electronically Signed   By: Iven Finn M.D.   On: 07/17/2021 17:24    Procedures Procedures   Medications Ordered in ED Medications  iohexol (OMNIPAQUE) 350 MG/ML injection 75 mL (75 mLs Intravenous Contrast Given 07/17/21 1834)    ED Course  I have reviewed the triage vital signs and the nursing notes.  Pertinent labs & imaging results that were available during my care of the patient were reviewed by me and considered in my medical decision making (see chart for details).    MDM Rules/Calculators/A&P                           55 year old male presents emergency department worsening shortness of breath.  Arrives on 2 L nasal cannula but is not hypoxic on room air.  Rest of the vitals stable.  Scattered rales on lung auscultation.  Denies any active chest pain.  Mild swelling of his lower extremities slightly worse on the left.  EKG shows sinus rhythm.  There are ST depression/T wave changes in V5, V6.  He was just admitted at  [facility] for an NSTEMI where he had a clear cath.  At that time his EKGs are noted to document inverted T waves in these leads as well.  This does not appear to be acute change.  No STEMI.  No active chest pain.  Blood work is baseline for the patient, tropes are negative, BNP is elevated but within his baseline normal.  Chest x-[place] shows mild pulmonary congestion.  Patient was initially tachycardic on arrival, with recent hospitalization CT PE study was done which ruled out pulmonary embolism but did identify cardiomegaly and pulmonary congestion.  Patient was given an IV dose of diuresis.  Patient was able to diurese a decent amount of urine.  He was taken off oxygen, ambulated in department with no hypoxia or shortness of breath.  He feels improved.  Plan for outpatient follow-up.  Patient at this time appears safe and stable for discharge and will be treated as an outpatient.  Discharge plan and strict return to ED precautions discussed, patient verbalizes understanding and agreement.  Final Clinical Impression(s) / ED Diagnoses Final diagnoses:  None    Rx / DC Orders ED Discharge Orders     None        Lorelle Gibbs, DO 07/17/21 2311

## 2021-07-17 NOTE — Discharge Instructions (Addendum)
You have been seen and discharged from the emergency department.  Take water pill as directed for the next 7 days, follow-up with your primary doctor for reevaluation.  Take home medications as prescribed. If you have any worsening symptoms or further concerns for your health please return to an emergency department for further evaluation.

## 2021-07-17 NOTE — ED Notes (Addendum)
Ambulated pt without difficulty. Pt remained 98% on room air.  States he hopes to be d/c

## 2021-07-17 NOTE — ED Triage Notes (Signed)
Pt with SOB x 2 days, recent admission for MI at [facility].  Pt wearing O2 on arrival to triage but denies having to wear home O2.

## 2021-07-18 NOTE — ED Notes (Signed)
Pt ambulatory to lobby after trying to find a ride. States that his significant other is going to try to get him

## 2021-07-22 ENCOUNTER — Encounter: Payer: Self-pay | Admitting: Cardiology

## 2021-07-22 ENCOUNTER — Ambulatory Visit (INDEPENDENT_AMBULATORY_CARE_PROVIDER_SITE_OTHER): Payer: Medicare Other | Admitting: Cardiology

## 2021-07-22 VITALS — BP 150/100 | HR 101 | Ht 73.0 in | Wt 191.8 lb

## 2021-07-22 DIAGNOSIS — E1165 Type 2 diabetes mellitus with hyperglycemia: Secondary | ICD-10-CM

## 2021-07-22 DIAGNOSIS — I2510 Atherosclerotic heart disease of native coronary artery without angina pectoris: Secondary | ICD-10-CM | POA: Diagnosis not present

## 2021-07-22 DIAGNOSIS — I5042 Chronic combined systolic (congestive) and diastolic (congestive) heart failure: Secondary | ICD-10-CM

## 2021-07-22 DIAGNOSIS — I428 Other cardiomyopathies: Secondary | ICD-10-CM | POA: Diagnosis not present

## 2021-07-22 DIAGNOSIS — I739 Peripheral vascular disease, unspecified: Secondary | ICD-10-CM | POA: Diagnosis not present

## 2021-07-22 MED ORDER — SACUBITRIL-VALSARTAN 24-26 MG PO TABS: 1.0000 | ORAL_TABLET | Freq: Two times a day (BID) | ORAL | 6 refills | Status: DC

## 2021-07-22 MED ORDER — SPIRONOLACTONE 25 MG PO TABS: 12.5000 mg | ORAL_TABLET | Freq: Every day | ORAL | 6 refills | Status: DC

## 2021-07-22 MED ORDER — FUROSEMIDE 40 MG PO TABS: 40.0000 mg | ORAL_TABLET | Freq: Every day | ORAL | 6 refills | Status: DC

## 2021-07-22 MED ORDER — SACUBITRIL-VALSARTAN 24-26 MG PO TABS: 1.0000 | ORAL_TABLET | Freq: Two times a day (BID) | ORAL | 0 refills | Status: DC

## 2021-07-22 NOTE — Progress Notes (Signed)
Cardiology Office Note  Date: 07/22/2021   ID: [name], DOB 28-Mar-1966, MRN [id number]  PCP:  Neale Burly, MD  Cardiologist:  Rozann Lesches, MD Electrophysiologist:  None   Chief Complaint  Patient presents with   Establish cardiology follow-up     History of Present Illness: Edward Rocha. is a 55 y.o. male presenting to establish cardiology care after recent hospital discharge, his PCP is Dr. Sherrie Rocha.  I reviewed the available records.  He was recently admitted to [facility] (transferred from [facility]) with acute combined heart failure and newly documented cardiomyopathy, LVEF 20 to 25% with diffuse hypokinesis.  He had mild cardiac enzyme elevation associated with heart failure, cardiac catheterization on September 7 demonstrated mild nonobstructive CAD.  He was treated medically.  Hospital course also notable for acute on chronic renal insufficiency with suspected CKD stage III at baseline.  He was discharged on Toprol-XL and losartan, also a 1 week course of Lasix.  He states that after coming off diuretics he started to feel worse, more short of breath, was ultimately seen in the ER at [facility] and found to have bilateral pleural effusions, was given a prescription for Lasix 20 mg daily.  His wife follows in our practice, he has decided to establish here, does not plan to go back to [place].  He states that leg swelling has not recurred but he has gained a few pounds, has a scale at home.  We discussed his recent heart testing, plan for further medication adjustments, potential for being evaluated in our advanced heart failure clinic in [place] depending on how he does clinically.  Past Medical History:  Diagnosis Date   Acute on chronic renal failure (HCC)    Essential hypertension    GERD (gastroesophageal reflux disease)    Hyperosmolar syndrome 2020   Hypothyroidism    Mild coronary artery disease    Cardiac catheterization September  2022 - [facility]   Nonischemic cardiomyopathy [facility])    PAD (peripheral artery disease) (HCC)    Thrombocytopenia ([place])    Type 2 diabetes mellitus ([place])     Past Surgical History:  Procedure Laterality Date   ABDOMINAL AORTOGRAM W/LOWER EXTREMITY N/A 02/06/2019   Procedure: ABDOMINAL AORTOGRAM W/LOWER EXTREMITY;  Surgeon: [name], MD;  Location: [place] CV LAB;  Service: Cardiovascular;  Laterality: N/A;  bilateral   ABDOMINAL AORTOGRAM W/LOWER EXTREMITY N/A 04/30/2019   Procedure: ABDOMINAL AORTOGRAM W/LOWER EXTREMITY;  Surgeon: [name], MD;  Location: [place] CV [facility];  Service: Cardiovascular;  Laterality: N/A;   AORTOGRAM  08/11/2019   Procedure: Aortogram;  Surgeon: [name], MD;  Location: [place];  Service: Vascular;;   ESOPHAGEAL BANDING  08/22/2019   Procedure: ESOPHAGEAL BANDING;  Surgeon: Milus Banister, MD;  Location: [facility] ENDOSCOPY;  Service: Endoscopy;;   ESOPHAGOGASTRODUODENOSCOPY N/A 08/22/2019   Procedure: ESOPHAGOGASTRODUODENOSCOPY (EGD);  Surgeon: Milus Banister, MD;  Location: [facility] ENDOSCOPY;  Service: Endoscopy;  Laterality: N/A;   FEMORAL-POPLITEAL BYPASS GRAFT Left 08/11/2019   Procedure: BYPASS LEFT FEMORAL-DISTAL POPLITEAL ARTERY USING PROPATEN GRAFT;  Surgeon: [name], MD;  Location: [place];  Service: Vascular;  Laterality: Left;   INSERTION OF DIALYSIS CATHETER Right 09/09/2019   Procedure: INSERTION OF DIALYSIS CATHETER;  Surgeon: Waynetta Sandy, MD;  Location: [place];  Service: Vascular;  Laterality: Right;   MULTIPLE TOOTH EXTRACTIONS     PERIPHERAL VASCULAR INTERVENTION  02/06/2019   Procedure: PERIPHERAL VASCULAR  INTERVENTION;  Surgeon: [name], MD;  Location: [place] CV LAB;  Service: Cardiovascular;;  Bilateral Iliacs   PERIPHERAL VASCULAR INTERVENTION  04/30/2019   Procedure: PERIPHERAL VASCULAR INTERVENTION;  Surgeon: [name], MD;  Location: [place] CV LAB;  Service:  Cardiovascular;;  Stent - Lt. Iliac    REMOVAL OF A DIALYSIS CATHETER Right 09/09/2019   Procedure: Removal Of A Dialysis Catheter;  Surgeon: Waynetta Sandy, MD;  Location: [place];  Service: Vascular;  Laterality: Right;   ULTRASOUND GUIDANCE FOR VASCULAR ACCESS Right 09/09/2019   Procedure: Ultrasound Guidance For Vascular Access;  Surgeon: Waynetta Sandy, MD;  Location: [place];  Service: Vascular;  Laterality: Right;   VASCULAR SURGERY      Current Outpatient Medications  Medication Sig Dispense Refill   acetaminophen (TYLENOL) 500 MG tablet Take 1,000 mg by mouth daily as needed for moderate pain or headache.     aspirin EC 81 MG tablet Take 81 mg by mouth daily. Swallow whole.     atorvastatin (LIPITOR) 40 MG tablet Take 40 mg by mouth at bedtime.     gabapentin (NEURONTIN) 600 MG tablet Take 1,200 mg by mouth 3 (three) times daily.     metoprolol succinate (TOPROL-XL) 25 MG 24 hr tablet Take 25 mg by mouth daily.     nitroGLYCERIN (NITROSTAT) 0.4 MG SL tablet Place 0.4 mg under the tongue every 5 (five) minutes as needed for chest pain.     spironolactone (ALDACTONE) 25 MG tablet Take 0.5 tablets (12.5 mg total) by mouth daily. 15 tablet 6   furosemide (LASIX) 40 MG tablet Take 1 tablet (40 mg total) by mouth daily. 30 tablet 6   sacubitril-valsartan (ENTRESTO) 24-26 MG Take 1 tablet by mouth 2 (two) times daily. 60 tablet 6   No current facility-administered medications for this visit.   Allergies:  Other   Social History: The patient  reports that he quit smoking about 2 years ago. His smoking use included cigarettes. He has a 36.00 pack-year smoking history. He has never used smokeless tobacco. He reports that he does not currently use alcohol. He reports that he does not use drugs.   Family History: The patient's family history includes Alcohol abuse in his brother and father; Bipolar disorder in his daughter and son; Cancer in his mother.   ROS: No  palpitations or syncope.  Physical Exam: VS:  BP (!) 150/100   Pulse (!) 101   Ht '6\' 1"'$  (1.854 m)   Wt 191 lb 12.8 oz (87 kg)   SpO2 98%   BMI 25.30 kg/m , BMI Body mass index is 25.3 kg/m.  Wt Readings from Last 3 Encounters:  07/22/21 191 lb 12.8 oz (87 kg)  07/17/21 187 lb 11.2 oz (85.1 kg)  10/19/19 (!) 445 lb 1.7 oz (201.9 kg)    General: Patient appears comfortable at rest. HEENT: Conjunctiva and lids normal, wearing a mask. Neck: Supple, no elevated JVP or carotid bruits, no thyromegaly. Lungs: Decreased breath sounds at both bases, no wheezing. Cardiac: Indistinct PMI, regular rate and rhythm, no S3, 3/6 systolic murmur, no pericardial rub. Abdomen: Soft, nontender, bowel sounds present. Extremities: Trace left ankle edema, distal pulses 2+. Skin: Warm and dry. Musculoskeletal: No kyphosis. Neuropsychiatric: Alert and oriented x3, affect grossly appropriate.  ECG:  An ECG dated 07/17/2021 was personally reviewed today and demonstrated:  Sinus tachycardia with probable biatrial enlargement, increased voltage and repolarization abnormalities.  Recent Labwork: 07/17/2021: B Natriuretic Peptide V3065235.0;  BUN 15; Creatinine, Ser 1.05; Hemoglobin 13.2; Platelets 110; Potassium 3.5; Sodium 135  September 2022: AST 37, ALT 16, hemoglobin A1c 6.4%, peak high-sensitivity troponin I 1690  Other Studies Reviewed Today:  Echocardiogram 07/05/2021 ([facility]): Left Ventricle: Left ventricle size is normal.    Left Ventricle: There is moderate concentric hypertrophy.    Left Ventricle: Systolic function is severely abnormal. EF: 20-25%.    Left Ventricle: There is diffuse hypokinesis of the left ventricle.    Right Ventricle: Right ventricle is mildly dilated.    Right Ventricle: Systolic function is low normal. Normal tricuspid  annular plane systolic excursion (TAPSE) >1.7 cm.    Left Atrium: Left atrium volume index is severely increased (>48  mL/m2).    Right Atrium: Right atrium  is mildly to moderately dilated.    Mitral Valve: There is mild regurgitation.    Tricuspid Valve: There is mild to moderate regurgitation.    Pericardium: There is no pericardial effusion.  Cardiac catheterization 07/06/2021 ([facility]): Coronary Angiography  1. Left Main -normal  2. Left anterior descending artery -10% mid, 10% distal  3. Diagonals -normal  4. Left Circumflex -25% mid  5. Obtuse Marginals -normal  6. Right Coronary Artery -normal  7. Posterior Descending Artery -normal   Hemodynamics  1. Aortic Pressure -140/70  mmHg  2. Left Ventricular - 140/33 mmHg   CONCLUSIONS:  1. Successful transradial cardiac catheterization  2. Mild nonobstructive coronary artery disease  3. Left ventricular solid pressure 33   Chest CTA 07/17/2021: IMPRESSION: 1. No evidence of significant pulmonary embolus. 2. Cardiac enlargement with moderate bilateral pleural effusions. 3. Patchy airspace disease in the lungs likely represents edema or possibly multifocal pneumonia. 4. Hepatic cirrhosis.  Mild splenic enlargement.  Cholelithiasis. 5. Aortic atherosclerosis.  Assessment and Plan:  1.  Nonischemic cardiomyopathy based on recent work-up through the [facility] system.  LVEF 20 to 25% with mildly dilated RV and low normal contraction, cardiac catheterization demonstrating only mild coronary atherosclerosis, right heart catheterization was not performed.  He has had trouble with recurrent fluid overload in the absence of diuretics, was only prescribed a 1 week course of Lasix at discharge from [place].  He states that he has been taking Toprol-XL and Cozaar as directed.  I reviewed his recent lab work and imaging studies.  Plan is to stop Cozaar and begin Entresto 24/26 mg twice daily.  Start Aldactone 12.5 mg daily.  Increase Lasix to 40 mg daily.  Continue current dose of Toprol-XL.  We will bring him back in a few weeks with BMET and continue to titrate therapy.  2.  Cardiac murmur, suggestive  of aortic or pulmonic valve disease although recent echocardiogram at [facility] did not indicate this.  Recent chest CTA did not describe any aortic aneurysmal disease or stenosis.  We will obtain follow-up echocardiogram once we get his medications further titrated.  3.  Mild coronary atherosclerosis and also history of PAD as discussed above.  He is on aspirin and Lipitor.  4.  Type 2 diabetes mellitus by history, currently not on any specific medical therapy.  Obvious candidate for SGLT2 inhibitor, but need to titrate therapy gradually.  Recent hemoglobin A1c 6.4%.  Medication Adjustments/Labs and Tests Ordered: Current medicines are reviewed at length with the patient today.  Concerns regarding medicines are outlined above.   Tests Ordered: Orders Placed This Encounter  Procedures   Basic metabolic panel     Medication Changes: Meds ordered this encounter  Medications   DISCONTD: sacubitril-valsartan (  ENTRESTO) 24-26 MG    Sig: Take 1 tablet by mouth 2 (two) times daily.    Dispense:  60 tablet    Refill:  0    TO BE USED WITH FREE 30-DAY TRIAL OFFER   spironolactone (ALDACTONE) 25 MG tablet    Sig: Take 0.5 tablets (12.5 mg total) by mouth daily.    Dispense:  15 tablet    Refill:  6    New 07/22/2021   furosemide (LASIX) 40 MG tablet    Sig: Take 1 tablet (40 mg total) by mouth daily.    Dispense:  30 tablet    Refill:  6    Dose increased 07/22/2021   sacubitril-valsartan (ENTRESTO) 24-26 MG    Sig: Take 1 tablet by mouth 2 (two) times daily.    Dispense:  60 tablet    Refill:  6    New 07/22/2021, stopping Losartan.     Disposition:  Follow up  2 weeks with BMET.  Signed, Satira Sark, MD, [facility] 07/22/2021 11:49 AM    [facility] [place], [place], [place] [address] Phone: [phone]; Fax: [phone]

## 2021-07-22 NOTE — Patient Instructions (Addendum)
Medication Instructions:  Stop Losartan (Cozaar). Begin Entresto 24/'26mg'$  twice a day. Begin Aldactone 12.'5mg'$   Increase Lasix to '40mg'$  daily.  Continue all other medications.     Labwork: BMET - orders given today.  Please do in 2 weeks (around 08/05/2021). Office will contact with results via phone or letter.    Testing/Procedures: none  Follow-Up: 2 weeks   Any Other Special Instructions Will Be Listed Below (If Applicable).   If you need a refill on your cardiac medications before your next appointment, please call your pharmacy.

## 2021-08-04 NOTE — Progress Notes (Deleted)
Cardiology Office Note  Date: 08/04/2021   ID: Edward Patricia., DOB Jun 24, 1966, MRN [id number]  PCP:  Neale Burly, MD  Cardiologist:  Rozann Lesches, MD Electrophysiologist:  None   Chief Complaint: 2 to 3-week follow-up  History of Present Illness: Edward Rocha. is a 56 y.o. male with a history of CAD, nonischemic cardiomyopathy, PAD, thrombocytopenia, DM2, hypothyroidism, GERD, HTN, acute on chronic renal failure, PAD, critical lower limb ischemia, chronic diastolic heart failure.  Last saw Dr. Domenic Polite on 07/22/2021.  He had recently been admitted to [facility] for acute on combined heart failure and newly documented cardiomyopathy with LVEF of 20 to 25% with diffuse hypokinesis.  Cardiac catheterization on September 17 demonstrated mild nonobstructive CAD.  He was treated medically.  Hospital course was notable for acute on chronic renal insufficiency with suspected CKD stage III at baseline.  He was discharged on Toprol-XL, losartan and 1 week course of Lasix.  He stated after coming off of diuretics he began to feel worse and more shortness of breath and was seen in the emergency room at [facility] find to have bilateral pleural effusions.  He was given prescription for Lasix 20 mg daily.  He stated his leg with swelling had not recurred but he had gained a few pounds..  Medication adjustments were discussed and potential for being evaluated in advanced heart failure clinic depending on how he did clinically.  Plan was to stop Cozaar and begin Entresto 24/26 mg p.o. twice daily.  Start Aldactone 12.5 mg daily, increase Lasix to 40 mg daily and continue current dose of Toprol-XL.  To come back in a few weeks with BMET and continue to titrate therapy.  Plan is to get a follow-up echocardiogram once medications are further titrated.  He was continuing on aspirin and Lipitor for history of mild coronary atherosclerosis and history of PAD.  Given history of diabetes and  currently not on his any medical therapy he was obvious candidate for SGLT2 inhibitor but needed to titrate therapy gradually.  Recent  hemoglobin A1c was 6.4%.   Labs not done.  Need labs to titrate therapy and assess renal function, electrolytes.  Tolerating Entresto therapy?  Basic metabolic panel result results?  Fluid status?  Past Medical History:  Diagnosis Date   Acute on chronic renal failure (HCC)    Essential hypertension    GERD (gastroesophageal reflux disease)    Hyperosmolar syndrome 2020   Hypothyroidism    Mild coronary artery disease    Cardiac catheterization September 2022 - [facility]   Nonischemic cardiomyopathy [facility])    PAD (peripheral artery disease) (HCC)    Thrombocytopenia ([place])    Type 2 diabetes mellitus ([place])     Past Surgical History:  Procedure Laterality Date   ABDOMINAL AORTOGRAM W/LOWER EXTREMITY N/A 02/06/2019   Procedure: ABDOMINAL AORTOGRAM W/LOWER EXTREMITY;  Surgeon: [name], MD;  Location: [place] [facility];  Service: Cardiovascular;  Laterality: N/A;  bilateral   ABDOMINAL AORTOGRAM W/LOWER EXTREMITY N/A 04/30/2019   Procedure: ABDOMINAL AORTOGRAM W/LOWER EXTREMITY;  Surgeon: [name], MD;  Location: [place] [facility] LAB;  Service: Cardiovascular;  Laterality: N/A;   AORTOGRAM  08/11/2019   Procedure: Aortogram;  Surgeon: [name], MD;  Location: [place];  Service: Vascular;;   ESOPHAGEAL BANDING  08/22/2019   Procedure: ESOPHAGEAL BANDING;  Surgeon: Milus Banister, MD;  Location: [facility] ENDOSCOPY;  Service: Endoscopy;;   ESOPHAGOGASTRODUODENOSCOPY N/A 08/22/2019   Procedure:  ESOPHAGOGASTRODUODENOSCOPY (EGD);  Surgeon: Milus Banister, MD;  Location: [facility] ENDOSCOPY;  Service: Endoscopy;  Laterality: N/A;   FEMORAL-POPLITEAL BYPASS GRAFT Left 08/11/2019   Procedure: BYPASS LEFT FEMORAL-DISTAL POPLITEAL ARTERY USING PROPATEN GRAFT;  Surgeon: [name], MD;  Location: [place];  Service: Vascular;  Laterality:  Left;   INSERTION OF DIALYSIS CATHETER Right 09/09/2019   Procedure: INSERTION OF DIALYSIS CATHETER;  Surgeon: Waynetta Sandy, MD;  Location: [place];  Service: Vascular;  Laterality: Right;   MULTIPLE TOOTH EXTRACTIONS     PERIPHERAL VASCULAR INTERVENTION  02/06/2019   Procedure: PERIPHERAL VASCULAR INTERVENTION;  Surgeon: [name], MD;  Location: [place] CV LAB;  Service: Cardiovascular;;  Bilateral Iliacs   PERIPHERAL VASCULAR INTERVENTION  04/30/2019   Procedure: PERIPHERAL VASCULAR INTERVENTION;  Surgeon: [name], MD;  Location: [place] [facility];  Service: Cardiovascular;;  Stent - Lt. Iliac    REMOVAL OF A DIALYSIS CATHETER Right 09/09/2019   Procedure: Removal Of A Dialysis Catheter;  Surgeon: Waynetta Sandy, MD;  Location: [place];  Service: Vascular;  Laterality: Right;   ULTRASOUND GUIDANCE FOR VASCULAR ACCESS Right 09/09/2019   Procedure: Ultrasound Guidance For Vascular Access;  Surgeon: Waynetta Sandy, MD;  Location: [place];  Service: Vascular;  Laterality: Right;   VASCULAR SURGERY      Current Outpatient Medications  Medication Sig Dispense Refill   acetaminophen (TYLENOL) 500 MG tablet Take 1,000 mg by mouth daily as needed for moderate pain or headache.     aspirin EC 81 MG tablet Take 81 mg by mouth daily. Swallow whole.     atorvastatin (LIPITOR) 40 MG tablet Take 40 mg by mouth at bedtime.     furosemide (LASIX) 40 MG tablet Take 1 tablet (40 mg total) by mouth daily. 30 tablet 6   gabapentin (NEURONTIN) 600 MG tablet Take 1,200 mg by mouth 3 (three) times daily.     metoprolol succinate (TOPROL-XL) 25 MG 24 hr tablet Take 25 mg by mouth daily.     nitroGLYCERIN (NITROSTAT) 0.4 MG SL tablet Place 0.4 mg under the tongue every 5 (five) minutes as needed for chest pain.     sacubitril-valsartan (ENTRESTO) 24-26 MG Take 1 tablet by mouth 2 (two) times daily. 60 tablet 6   spironolactone (ALDACTONE) 25 MG tablet Take 0.5  tablets (12.5 mg total) by mouth daily. 15 tablet 6   No current facility-administered medications for this visit.   Allergies:  Other   Social History: The patient  reports that he quit smoking about 2 years ago. His smoking use included cigarettes. He has a 36.00 pack-year smoking history. He has never used smokeless tobacco. He reports that he does not currently use alcohol. He reports that he does not use drugs.   Family History: The patient's family history includes Alcohol abuse in his brother and father; Bipolar disorder in his daughter and son; Cancer in his mother.   ROS:  Please see the history of present illness. Otherwise, complete review of systems is positive for none.  All other systems are reviewed and negative.   Physical Exam: VS:  There were no vitals taken for this visit., BMI There is no height or weight on file to calculate BMI.  Wt Readings from Last 3 Encounters:  07/22/21 191 lb 12.8 oz (87 kg)  07/17/21 187 lb 11.2 oz (85.1 kg)  10/19/19 (!) 445 lb 1.7 oz (201.9 kg)    General: Patient appears comfortable at rest. HEENT:  Conjunctiva and lids normal, oropharynx clear with moist mucosa. Neck: Supple, no elevated JVP or carotid bruits, no thyromegaly. Lungs: Clear to auscultation, nonlabored breathing at rest. Cardiac: Regular rate and rhythm, no S3 or significant systolic murmur, no pericardial rub. Abdomen: Soft, nontender, no hepatomegaly, bowel sounds present, no guarding or rebound. Extremities: No pitting edema, distal pulses 2+. Skin: Warm and dry. Musculoskeletal: No kyphosis. Neuropsychiatric: Alert and oriented x3, affect grossly appropriate.  ECG:  {EKG/Telemetry Strips [phone]}  Recent Labwork: 07/17/2021: B Natriuretic Peptide 1,662.0; BUN 15; Creatinine, Ser 1.05; Hemoglobin 13.2; Platelets 110; Potassium 3.5; Sodium 135  No results found for: CHOL, TRIG, HDL, CHOLHDL, VLDL, LDLCALC, LDLDIRECT  Other Studies Reviewed Today:     Echocardiogram 07/05/2021 ([facility]): Left Ventricle: Left ventricle size is normal.    Left Ventricle: There is moderate concentric hypertrophy.    Left Ventricle: Systolic function is severely abnormal. EF: 20-25%.    Left Ventricle: There is diffuse hypokinesis of the left ventricle.    Right Ventricle: Right ventricle is mildly dilated.    Right Ventricle: Systolic function is low normal. Normal tricuspid  annular plane systolic excursion (TAPSE) >1.7 cm.    Left Atrium: Left atrium volume index is severely increased (>48  mL/m2).    Right Atrium: Right atrium is mildly to moderately dilated.    Mitral Valve: There is mild regurgitation.    Tricuspid Valve: There is mild to moderate regurgitation.    Pericardium: There is no pericardial effusion.   Cardiac catheterization 07/06/2021 ([facility]): Coronary Angiography  1. Left Main -normal  2. Left anterior descending artery -10% mid, 10% distal  3. Diagonals -normal  4. Left Circumflex -25% mid  5. Obtuse Marginals -normal  6. Right Coronary Artery -normal  7. Posterior Descending Artery -normal   Hemodynamics  1. Aortic Pressure -140/70  mmHg  2. Left Ventricular - 140/33 mmHg   CONCLUSIONS:  1. Successful transradial cardiac catheterization  2. Mild nonobstructive coronary artery disease  3. Left ventricular solid pressure 33    Chest CTA 07/17/2021: IMPRESSION: 1. No evidence of significant pulmonary embolus. 2. Cardiac enlargement with moderate bilateral pleural effusions. 3. Patchy airspace disease in the lungs likely represents edema or possibly multifocal pneumonia. 4. Hepatic cirrhosis.  Mild splenic enlargement.  Cholelithiasis. 5. Aortic atherosclerosis.   Assessment and Plan:  1. Nonischemic cardiomyopathy (HCC)   2. Cardiac murmur   3. Mild coronary artery disease   4. Uncontrolled type 2 diabetes mellitus with hyperglycemia ([place])   5. CAD in native artery      Medication Adjustments/Labs and Tests  Ordered: Current medicines are reviewed at length with the patient today.  Concerns regarding medicines are outlined above.   Disposition: Follow-up with ***  Signed, Levell July, NP 08/04/2021 12:54 PM    [place] at [facility] [place], [place], [place] [address] Phone: [phone]; Fax: [phone]

## 2021-08-05 ENCOUNTER — Ambulatory Visit: Payer: Medicare Other | Admitting: Family Medicine

## 2021-08-05 DIAGNOSIS — I2510 Atherosclerotic heart disease of native coronary artery without angina pectoris: Secondary | ICD-10-CM

## 2021-08-05 DIAGNOSIS — E1165 Type 2 diabetes mellitus with hyperglycemia: Secondary | ICD-10-CM

## 2021-08-05 DIAGNOSIS — I428 Other cardiomyopathies: Secondary | ICD-10-CM

## 2021-08-05 DIAGNOSIS — R011 Cardiac murmur, unspecified: Secondary | ICD-10-CM

## 2021-10-08 DIAGNOSIS — I517 Cardiomegaly: Secondary | ICD-10-CM | POA: Diagnosis not present

## 2021-10-08 DIAGNOSIS — Z79899 Other long term (current) drug therapy: Secondary | ICD-10-CM | POA: Diagnosis not present

## 2021-10-08 DIAGNOSIS — E119 Type 2 diabetes mellitus without complications: Secondary | ICD-10-CM | POA: Diagnosis not present

## 2021-10-08 DIAGNOSIS — R600 Localized edema: Secondary | ICD-10-CM | POA: Diagnosis not present

## 2021-10-08 DIAGNOSIS — R0600 Dyspnea, unspecified: Secondary | ICD-10-CM | POA: Diagnosis not present

## 2021-10-08 DIAGNOSIS — I509 Heart failure, unspecified: Secondary | ICD-10-CM | POA: Diagnosis not present

## 2021-10-08 DIAGNOSIS — I700 Atherosclerosis of aorta: Secondary | ICD-10-CM | POA: Diagnosis not present

## 2021-10-08 DIAGNOSIS — Z20822 Contact with and (suspected) exposure to covid-19: Secondary | ICD-10-CM | POA: Diagnosis not present

## 2021-10-08 DIAGNOSIS — J9811 Atelectasis: Secondary | ICD-10-CM | POA: Diagnosis not present

## 2021-10-08 DIAGNOSIS — J90 Pleural effusion, not elsewhere classified: Secondary | ICD-10-CM | POA: Diagnosis not present

## 2021-10-08 DIAGNOSIS — I110 Hypertensive heart disease with heart failure: Secondary | ICD-10-CM | POA: Diagnosis not present

## 2021-10-14 DIAGNOSIS — I214 Non-ST elevation (NSTEMI) myocardial infarction: Secondary | ICD-10-CM | POA: Diagnosis not present

## 2021-10-14 DIAGNOSIS — I2510 Atherosclerotic heart disease of native coronary artery without angina pectoris: Secondary | ICD-10-CM | POA: Diagnosis not present

## 2021-10-14 DIAGNOSIS — J449 Chronic obstructive pulmonary disease, unspecified: Secondary | ICD-10-CM | POA: Diagnosis not present

## 2021-10-14 DIAGNOSIS — E1122 Type 2 diabetes mellitus with diabetic chronic kidney disease: Secondary | ICD-10-CM | POA: Diagnosis not present

## 2021-10-14 DIAGNOSIS — J9601 Acute respiratory failure with hypoxia: Secondary | ICD-10-CM | POA: Diagnosis not present

## 2021-10-14 DIAGNOSIS — I509 Heart failure, unspecified: Secondary | ICD-10-CM | POA: Diagnosis not present

## 2021-10-14 DIAGNOSIS — I5022 Chronic systolic (congestive) heart failure: Secondary | ICD-10-CM | POA: Diagnosis not present

## 2021-10-14 DIAGNOSIS — N1831 Chronic kidney disease, stage 3a: Secondary | ICD-10-CM | POA: Diagnosis not present

## 2021-10-14 DIAGNOSIS — E785 Hyperlipidemia, unspecified: Secondary | ICD-10-CM | POA: Diagnosis not present

## 2021-10-14 DIAGNOSIS — Z20822 Contact with and (suspected) exposure to covid-19: Secondary | ICD-10-CM | POA: Diagnosis not present

## 2021-10-14 DIAGNOSIS — E1151 Type 2 diabetes mellitus with diabetic peripheral angiopathy without gangrene: Secondary | ICD-10-CM | POA: Diagnosis not present

## 2021-10-14 DIAGNOSIS — I10 Essential (primary) hypertension: Secondary | ICD-10-CM | POA: Diagnosis not present

## 2021-10-14 DIAGNOSIS — R079 Chest pain, unspecified: Secondary | ICD-10-CM | POA: Diagnosis not present

## 2021-10-14 DIAGNOSIS — R778 Other specified abnormalities of plasma proteins: Secondary | ICD-10-CM | POA: Diagnosis not present

## 2021-10-14 DIAGNOSIS — I130 Hypertensive heart and chronic kidney disease with heart failure and stage 1 through stage 4 chronic kidney disease, or unspecified chronic kidney disease: Secondary | ICD-10-CM | POA: Diagnosis not present

## 2021-10-14 DIAGNOSIS — I110 Hypertensive heart disease with heart failure: Secondary | ICD-10-CM | POA: Diagnosis not present

## 2021-10-14 DIAGNOSIS — J441 Chronic obstructive pulmonary disease with (acute) exacerbation: Secondary | ICD-10-CM | POA: Diagnosis not present

## 2021-10-14 DIAGNOSIS — E871 Hypo-osmolality and hyponatremia: Secondary | ICD-10-CM | POA: Diagnosis not present

## 2021-10-14 DIAGNOSIS — N179 Acute kidney failure, unspecified: Secondary | ICD-10-CM | POA: Diagnosis not present

## 2021-10-14 DIAGNOSIS — R0602 Shortness of breath: Secondary | ICD-10-CM | POA: Diagnosis not present

## 2021-10-15 DIAGNOSIS — I5021 Acute systolic (congestive) heart failure: Secondary | ICD-10-CM | POA: Diagnosis not present

## 2021-10-15 DIAGNOSIS — N1831 Chronic kidney disease, stage 3a: Secondary | ICD-10-CM | POA: Diagnosis not present

## 2021-10-15 DIAGNOSIS — R748 Abnormal levels of other serum enzymes: Secondary | ICD-10-CM | POA: Diagnosis not present

## 2021-10-15 DIAGNOSIS — I110 Hypertensive heart disease with heart failure: Secondary | ICD-10-CM | POA: Diagnosis not present

## 2021-10-15 DIAGNOSIS — I130 Hypertensive heart and chronic kidney disease with heart failure and stage 1 through stage 4 chronic kidney disease, or unspecified chronic kidney disease: Secondary | ICD-10-CM | POA: Diagnosis not present

## 2021-10-15 DIAGNOSIS — E785 Hyperlipidemia, unspecified: Secondary | ICD-10-CM | POA: Diagnosis not present

## 2021-10-15 DIAGNOSIS — R079 Chest pain, unspecified: Secondary | ICD-10-CM | POA: Diagnosis not present

## 2021-10-15 DIAGNOSIS — I509 Heart failure, unspecified: Secondary | ICD-10-CM | POA: Diagnosis not present

## 2021-10-15 DIAGNOSIS — Z20822 Contact with and (suspected) exposure to covid-19: Secondary | ICD-10-CM | POA: Diagnosis not present

## 2021-10-15 DIAGNOSIS — E1151 Type 2 diabetes mellitus with diabetic peripheral angiopathy without gangrene: Secondary | ICD-10-CM | POA: Diagnosis not present

## 2021-10-15 DIAGNOSIS — I517 Cardiomegaly: Secondary | ICD-10-CM | POA: Diagnosis not present

## 2021-10-15 DIAGNOSIS — I5023 Acute on chronic systolic (congestive) heart failure: Secondary | ICD-10-CM | POA: Diagnosis not present

## 2021-10-15 DIAGNOSIS — I214 Non-ST elevation (NSTEMI) myocardial infarction: Secondary | ICD-10-CM | POA: Diagnosis not present

## 2021-10-15 DIAGNOSIS — J9601 Acute respiratory failure with hypoxia: Secondary | ICD-10-CM | POA: Diagnosis not present

## 2021-10-15 DIAGNOSIS — I21A1 Myocardial infarction type 2: Secondary | ICD-10-CM | POA: Diagnosis not present

## 2021-10-15 DIAGNOSIS — J441 Chronic obstructive pulmonary disease with (acute) exacerbation: Secondary | ICD-10-CM | POA: Diagnosis not present

## 2021-10-15 DIAGNOSIS — J440 Chronic obstructive pulmonary disease with acute lower respiratory infection: Secondary | ICD-10-CM | POA: Diagnosis not present

## 2021-10-15 DIAGNOSIS — N179 Acute kidney failure, unspecified: Secondary | ICD-10-CM | POA: Diagnosis not present

## 2021-10-15 DIAGNOSIS — E1122 Type 2 diabetes mellitus with diabetic chronic kidney disease: Secondary | ICD-10-CM | POA: Diagnosis not present

## 2021-10-15 DIAGNOSIS — J449 Chronic obstructive pulmonary disease, unspecified: Secondary | ICD-10-CM | POA: Diagnosis not present

## 2021-10-15 DIAGNOSIS — I2510 Atherosclerotic heart disease of native coronary artery without angina pectoris: Secondary | ICD-10-CM | POA: Diagnosis not present

## 2021-10-15 DIAGNOSIS — E871 Hypo-osmolality and hyponatremia: Secondary | ICD-10-CM | POA: Diagnosis not present

## 2021-10-19 DIAGNOSIS — I21A1 Myocardial infarction type 2: Secondary | ICD-10-CM | POA: Diagnosis not present

## 2021-10-19 DIAGNOSIS — I5023 Acute on chronic systolic (congestive) heart failure: Secondary | ICD-10-CM | POA: Diagnosis not present

## 2021-10-19 DIAGNOSIS — N1831 Chronic kidney disease, stage 3a: Secondary | ICD-10-CM | POA: Diagnosis not present

## 2021-10-19 DIAGNOSIS — I5021 Acute systolic (congestive) heart failure: Secondary | ICD-10-CM | POA: Diagnosis not present

## 2021-10-19 DIAGNOSIS — N179 Acute kidney failure, unspecified: Secondary | ICD-10-CM | POA: Diagnosis not present

## 2021-10-19 DIAGNOSIS — I130 Hypertensive heart and chronic kidney disease with heart failure and stage 1 through stage 4 chronic kidney disease, or unspecified chronic kidney disease: Secondary | ICD-10-CM | POA: Diagnosis not present

## 2021-10-19 DIAGNOSIS — E871 Hypo-osmolality and hyponatremia: Secondary | ICD-10-CM | POA: Diagnosis not present

## 2021-10-20 DIAGNOSIS — I509 Heart failure, unspecified: Secondary | ICD-10-CM | POA: Insufficient documentation

## 2021-10-20 DIAGNOSIS — I5023 Acute on chronic systolic (congestive) heart failure: Secondary | ICD-10-CM | POA: Diagnosis not present

## 2021-10-20 DIAGNOSIS — N1831 Chronic kidney disease, stage 3a: Secondary | ICD-10-CM | POA: Diagnosis not present

## 2021-10-20 DIAGNOSIS — E871 Hypo-osmolality and hyponatremia: Secondary | ICD-10-CM | POA: Diagnosis not present

## 2021-10-20 DIAGNOSIS — I5021 Acute systolic (congestive) heart failure: Secondary | ICD-10-CM | POA: Diagnosis not present

## 2021-10-20 DIAGNOSIS — N179 Acute kidney failure, unspecified: Secondary | ICD-10-CM | POA: Diagnosis not present

## 2021-10-21 ENCOUNTER — Telehealth: Payer: Self-pay | Admitting: Cardiology

## 2021-10-21 DIAGNOSIS — N179 Acute kidney failure, unspecified: Secondary | ICD-10-CM | POA: Diagnosis not present

## 2021-10-21 DIAGNOSIS — I5021 Acute systolic (congestive) heart failure: Secondary | ICD-10-CM | POA: Diagnosis not present

## 2021-10-21 DIAGNOSIS — E871 Hypo-osmolality and hyponatremia: Secondary | ICD-10-CM | POA: Diagnosis not present

## 2021-10-21 NOTE — Telephone Encounter (Signed)
Calling in to schedule pt for 4 week follow up appt

## 2021-11-10 ENCOUNTER — Emergency Department (HOSPITAL_COMMUNITY)
Admission: EM | Admit: 2021-11-10 | Discharge: 2021-11-10 | Disposition: A | Discharge status: Home / Self Care | Payer: Medicare Other | Attending: Emergency Medicine | Admitting: Emergency Medicine

## 2021-11-10 ENCOUNTER — Encounter (HOSPITAL_COMMUNITY): Payer: Self-pay | Admitting: *Deleted

## 2021-11-10 ENCOUNTER — Emergency Department (HOSPITAL_COMMUNITY): Payer: Medicare Other

## 2021-11-10 DIAGNOSIS — I509 Heart failure, unspecified: Secondary | ICD-10-CM | POA: Diagnosis not present

## 2021-11-10 DIAGNOSIS — J90 Pleural effusion, not elsewhere classified: Secondary | ICD-10-CM | POA: Diagnosis not present

## 2021-11-10 DIAGNOSIS — I110 Hypertensive heart disease with heart failure: Secondary | ICD-10-CM | POA: Diagnosis not present

## 2021-11-10 DIAGNOSIS — J441 Chronic obstructive pulmonary disease with (acute) exacerbation: Secondary | ICD-10-CM | POA: Insufficient documentation

## 2021-11-10 DIAGNOSIS — R059 Cough, unspecified: Secondary | ICD-10-CM | POA: Diagnosis not present

## 2021-11-10 DIAGNOSIS — R0602 Shortness of breath: Secondary | ICD-10-CM | POA: Diagnosis not present

## 2021-11-10 DIAGNOSIS — I517 Cardiomegaly: Secondary | ICD-10-CM | POA: Diagnosis not present

## 2021-11-10 LAB — COMPREHENSIVE METABOLIC PANEL
ALT: 23 U/L (ref 0–44)
AST: 46 U/L — ABNORMAL HIGH (ref 15–41)
Albumin: 3 g/dL — ABNORMAL LOW (ref 3.5–5.0)
Alkaline Phosphatase: 85 U/L (ref 38–126)
Anion gap: 8 (ref 5–15)
BUN: 27 mg/dL — ABNORMAL HIGH (ref 6–20)
CO2: 22 mmol/L (ref 22–32)
Calcium: 8.4 mg/dL — ABNORMAL LOW (ref 8.9–10.3)
Chloride: 99 mmol/L (ref 98–111)
Creatinine, Ser: 1.23 mg/dL (ref 0.61–1.24)
GFR, Estimated: >60 mL/min (ref >60)
Glucose, Bld: 89 mg/dL (ref 70–99)
Potassium: 3.8 mmol/L (ref 3.5–5.1)
Sodium: 129 mmol/L — ABNORMAL LOW (ref 135–145)
Total Bilirubin: 1.5 mg/dL — ABNORMAL HIGH (ref 0.3–1.2)
Total Protein: 8.9 g/dL — ABNORMAL HIGH (ref 6.5–8.1)

## 2021-11-10 LAB — CBC
HCT: 39.2 % (ref 39.0–52.0)
Hemoglobin: 12.7 g/dL — ABNORMAL LOW (ref 13.0–17.0)
MCH: 26.6 pg (ref 26.0–34.0)
MCHC: 32.4 g/dL (ref 30.0–36.0)
MCV: 82 fL (ref 80.0–100.0)
Platelets: 136 10*3/uL — ABNORMAL LOW (ref 150–400)
RBC: 4.78 MIL/uL (ref 4.22–5.81)
RDW: 16.8 % — ABNORMAL HIGH (ref 11.5–15.5)
WBC: 4.2 10*3/uL (ref 4.0–10.5)
nRBC: 0 % (ref 0.0–0.2)

## 2021-11-10 MED ORDER — FUROSEMIDE 10 MG/ML IJ SOLN
40.0000 mg | INTRAMUSCULAR | Status: AC
  Administered 2021-11-10: 40 mg via INTRAVENOUS
  Filled 2021-11-10: qty 4

## 2021-11-10 MED ORDER — IPRATROPIUM-ALBUTEROL 0.5-2.5 (3) MG/3ML IN SOLN
3.0000 mL | Freq: Once | RESPIRATORY_TRACT | Status: AC
  Administered 2021-11-10: 3 mL via RESPIRATORY_TRACT
  Filled 2021-11-10: qty 3

## 2021-11-10 MED ORDER — ALBUTEROL SULFATE HFA 108 (90 BASE) MCG/ACT IN AERS
2.0000 | INHALATION_SPRAY | Freq: Once | RESPIRATORY_TRACT | Status: AC
  Administered 2021-11-10: 2 via RESPIRATORY_TRACT
  Filled 2021-11-10: qty 6.7

## 2021-11-10 MED ORDER — FUROSEMIDE 40 MG PO TABS: 40.0000 mg | ORAL_TABLET | Freq: Two times a day (BID) | ORAL | 0 refills | Status: DC

## 2021-11-10 MED ORDER — PREDNISONE 20 MG PO TABS
40.0000 mg | ORAL_TABLET | Freq: Once | ORAL | Status: AC
  Administered 2021-11-10: 40 mg via ORAL
  Filled 2021-11-10: qty 2

## 2021-11-10 MED ORDER — AEROCHAMBER PLUS FLO-VU MEDIUM MISC
1.0000 | Freq: Once | Status: AC
  Administered 2021-11-10: 1
  Filled 2021-11-10: qty 1

## 2021-11-10 NOTE — ED Provider Notes (Signed)
[facility] Provider Note   CSN: [id number] Arrival date & time: 11/10/21  1642     History  Chief Complaint  Patient presents with   Shortness of Breath    Edward Rocha. is a 56 y.o. male.   Shortness of Breath  This patient is a 56 year old male, presents to the hospital today with shortness of breath.  The patient reports that he has a history of congestive heart failure and in fact had recently been admitted to an outside hospital where he was diuresed about 3 weeks ago.  He is done very well and continues to take his Lasix however he has run out of his albuterol inhaler which he could not find and his albuterol nebulizer machine broke last night.  That is when he started to get more short of breath, since that time he has been coughing up yellow phlegm, feeling short of breath.  He denies any worsening swelling of his legs or his abdomen, he has no pain in his chest or his abdomen, no headache, no fevers or chills, no nausea vomiting or diarrhea.  Home Medications Prior to Admission medications   Medication Sig Start Date End Date Taking? Authorizing Provider  acetaminophen (TYLENOL) 500 MG tablet Take 1,000 mg by mouth daily as needed for moderate pain or headache.   Yes [provider]  aspirin EC 81 MG tablet Take 81 mg by mouth daily. Swallow whole.   Yes [provider]  atorvastatin (LIPITOR) 40 MG tablet Take 40 mg by mouth at bedtime.   Yes [provider]  gabapentin (NEURONTIN) 300 MG capsule Take 600 mg by mouth 2 (two) times daily. 09/26/21  Yes [provider]  nitroGLYCERIN (NITROSTAT) 0.4 MG SL tablet Place 0.4 mg under the tongue every 5 (five) minutes as needed for chest pain.   Yes [provider]  sacubitril-valsartan (ENTRESTO) 24-26 MG Take 1 tablet by mouth 2 (two) times daily. 07/22/21  Yes Satira Sark, MD  spironolactone (ALDACTONE) 25 MG tablet Take 0.5 tablets (12.5 mg total) by  mouth daily. 07/22/21  Yes Satira Sark, MD  furosemide (LASIX) 40 MG tablet Take 1 tablet (40 mg total) by mouth 2 (two) times daily for 7 days. 11/10/21 11/17/21  Noemi Chapel, MD      Allergies    Other    Review of Systems   Review of Systems  Respiratory:  Positive for shortness of breath.   All other systems reviewed and are negative.  Physical Exam Updated Vital Signs BP (!) 166/96    Pulse (!) 106    Temp 98.5 F (36.9 C) (Oral)    Resp 20    Ht 1.854 m (6\' 1" )    Wt 86 kg    SpO2 98%    BMI 25.01 kg/m  Physical Exam Vitals and nursing note reviewed.  Constitutional:      General: He is not in acute distress.    Appearance: He is well-developed.  HENT:     Head: Normocephalic and atraumatic.     Mouth/Throat:     Pharynx: No oropharyngeal exudate.  Eyes:     General: No scleral icterus.       Right eye: No discharge.        Left eye: No discharge.     Conjunctiva/sclera: Conjunctivae normal.     Pupils: Pupils are equal, round, and reactive to light.  Neck:     Thyroid: No thyromegaly.  Vascular: No JVD.     Comments: No JVD Cardiovascular:     Rate and Rhythm: Normal rate and regular rhythm.     Heart sounds: Normal heart sounds. No murmur heard.   No friction rub. No gallop.  Pulmonary:     Effort: Pulmonary effort is normal. No respiratory distress.     Breath sounds: Wheezing present. No rales.     Comments: No increased work of breathing, speaks in full sentences, he has mild expiratory wheezing on expiration, no rales or rhonchi Abdominal:     General: Bowel sounds are normal. There is no distension.     Palpations: Abdomen is soft. There is no mass.     Tenderness: There is no abdominal tenderness.  Musculoskeletal:        General: No tenderness. Normal range of motion.     Cervical back: Normal range of motion and neck supple.     Right lower leg: Edema present.     Left lower leg: Edema present.     Comments: 1+ edema symmetrical lower  extremities below the knees  Lymphadenopathy:     Cervical: No cervical adenopathy.  Skin:    General: Skin is warm and dry.     Findings: No erythema or rash.  Neurological:     Mental Status: He is alert.     Coordination: Coordination normal.  Psychiatric:        Behavior: Behavior normal.    ED Results / Procedures / Treatments   Labs (all labs ordered are listed, but only abnormal results are displayed) Labs Reviewed  COMPREHENSIVE METABOLIC PANEL - Abnormal; Notable for the following components:      Result Value   Sodium 129 (*)    BUN 27 (*)    Calcium 8.4 (*)    Total Protein 8.9 (*)    Albumin 3.0 (*)    AST 46 (*)    Total Bilirubin 1.5 (*)    All other components within normal limits  CBC - Abnormal; Notable for the following components:   Hemoglobin 12.7 (*)    RDW 16.8 (*)    Platelets 136 (*)    All other components within normal limits    EKG None  Radiology DG Chest 2 View  Result Date: 11/10/2021 CLINICAL DATA:  Shortness of breath and cough.  Yellow phlegm. EXAM: CHEST - 2 VIEW COMPARISON:  Chest radiograph 10/14/2021, CT 10/08/2021 FINDINGS: Similar cardiomegaly. Unchanged mediastinal contours. There are small bilateral pleural effusions. Fluid in the fissures. Diffuse peribronchial cuffing, increased from prior exam, with mild septal thickening. No confluent consolidation. No pneumothorax. Remote right upper posterior rib fractures. IMPRESSION: Findings suggesting CHF with cardiomegaly, small bilateral pleural effusions, and pulmonary edema. No focal airspace disease. Electronically Signed   By: Keith Rake M.D.   On: 11/10/2021 18:33    Procedures Procedures    Medications Ordered in ED Medications  ipratropium-albuterol (DUONEB) 0.5-2.5 (3) MG/3ML nebulizer solution 3 mL (3 mLs Nebulization Given 11/10/21 1847)  albuterol (VENTOLIN HFA) 108 (90 Base) MCG/ACT inhaler 2 puff (2 puffs Inhalation Given 11/10/21 1837)  AeroChamber Plus Flo-Vu  Medium MISC 1 each (1 each Other Given 11/10/21 1836)  predniSONE (DELTASONE) tablet 40 mg (40 mg Oral Given 11/10/21 1846)  furosemide (LASIX) injection 40 mg (40 mg Intravenous Given 11/10/21 1939)    ED Course/ Medical Decision Making/ A&P  Medical Decision Making   This patient presents to the ED for concern of shortness of breath, this involves an  shortness of breath shortness of breathextensive number of treatment options, and is a complaint that carries with it a high risk of complications and morbidity.  The differential diagnosis includes COPD exacerbation, would also consider that this could be related to congestive heart failure, would also consider pneumonia, COVID, flu, pulmonary edema, COPD seems like the most likely answer   Co morbidities that complicate the patient evaluation  History of congestive heart failure, hypertension   Additional history obtained:  Additional history obtained from electronic medical record External records from outside source obtained and reviewed including confirmed his admission to outside hospital for heart failure with reduced ejection fraction   Lab Tests:  I Ordered, and personally interpreted labs.  The pertinent results include:  Na of 129, BUN of 27, 12.7 hgb, 136 platelets.    Imaging Studies ordered:  I ordered imaging studies including portable chest x-[facility] I independently visualized and interpreted imaging which showed cardiomegaly, pulmonary edema likely I agree with the radiologist interpretation   Cardiac Monitoring:  The patient was maintained on a cardiac monitor.  I personally viewed and interpreted the cardiac monitored which showed an underlying rhythm of: Normal sinus rhythm, borderline sinus tachycardia   Medicines ordered and prescription drug management:  I ordered medication including bronchodilator therapy including a albuterol metered-dose inhaler with a spacer and furosemide for  shortness of breath which is multi factorial Reevaluation of the patient after these medicines showed that the patient improved I have reviewed the patients home medicines and have made adjustments as needed   Test Considered:  CT scan of the chest though this does not seem likely to be a pulmonary embolus   Critical Interventions:  Bronchodilators Diuretics   Problem List / ED Course:  Shortness of breath, could be related to multiple different causes of multiple different medications were given, he improved very well and feels very comfortable for going home.   Reevaluation:  After the interventions noted above, I reevaluated the patient and found that they have :improved   Dispostion:  After consideration of the diagnostic results and the patients response to treatment, I feel that the patent would benefit from discharge home.          Final Clinical Impression(s) / ED Diagnoses Final diagnoses:  Acute on chronic congestive heart failure, unspecified heart failure type ([place])  COPD exacerbation ([place])    Rx / DC Orders ED Discharge Orders          Ordered    furosemide (LASIX) 40 MG tablet  2 times daily       Note to Pharmacy: Dose increased 07/22/2021   11/10/21 2030              Noemi Chapel, MD 11/10/21 2033

## 2021-11-10 NOTE — Discharge Instructions (Signed)
Please use the inhaler, 2 puffs every 4 hours as needed, I have also requested that you start taking Lasix or furosemide twice a day for the next week, this will help you to get some of the fluid off.  Please return to the emergency department immediately for severe or worsening symptoms.  You will need to follow-up with a cardiologist this week, Dr. Domenic Polite

## 2021-11-10 NOTE — ED Triage Notes (Signed)
States he was getting ready to take a breathing treatment and his machine stopped working. States he only needs a breathing treatment

## 2021-11-29 ENCOUNTER — Encounter: Payer: Self-pay | Admitting: *Deleted

## 2021-12-01 ENCOUNTER — Ambulatory Visit: Payer: Medicare Other | Admitting: Cardiology

## 2021-12-01 NOTE — Progress Notes (Deleted)
Cardiology Office Note  Date: 12/01/2021   ID: [name], DOB [birth year], MRN [id number]  PCP:  Edward Burly, MD  Cardiologist:  Rozann Lesches, MD Electrophysiologist:  None   No chief complaint on file.   History of Present Illness: Edward Rocha. is a 56 y.o. male last seen in September 2022 to establish care.  Records indicate recent ER visit at [facility] with shortness of breath.  Chest x-[facility] indicated small bilateral pleural effusions and pulmonary edema, no infiltrates.  He was mildly anemic with hemoglobin 12.7, also hyponatremic with sodium 129, creatinine 1.23.  No BNP obtained.  He was treated with both bronchodilators and diuretics with discharge home per ER staff.  I see that he was also admitted to [facility] in December 2020 to with acute on chronic HFrEF complicated by hypoxic respiratory failure with influenza A pneumonia.  Follow-up echocardiogram obtained during that [facility] stay revealed LVEF approximately 15 to 20%, moderate RV dysfunction, mild mitral vegetation, and moderate tricuspid regurgitation with RVSP estimated 41 mmHg.  Past Medical History:  Diagnosis Date   Acute on chronic renal failure (HCC)    Essential hypertension    GERD (gastroesophageal reflux disease)    Hyperosmolar syndrome 2020   Hypothyroidism    Mild coronary artery disease    Cardiac catheterization September 2022 - [facility]   Nonischemic cardiomyopathy [facility])    PAD (peripheral artery disease) (HCC)    Thrombocytopenia ([place])    Type 2 diabetes mellitus ([place])     Past Surgical History:  Procedure Laterality Date   ABDOMINAL AORTOGRAM W/LOWER EXTREMITY N/A 02/06/2019   Procedure: ABDOMINAL AORTOGRAM W/LOWER EXTREMITY;  Surgeon: Edward Heck, MD;  Location: [facility] CV LAB;  Service: Cardiovascular;  Laterality: N/A;  bilateral   ABDOMINAL AORTOGRAM W/LOWER EXTREMITY N/A 04/30/2019   Procedure: ABDOMINAL AORTOGRAM W/LOWER EXTREMITY;   Surgeon: Edward Heck, MD;  Location: [place] CV LAB;  Service: Cardiovascular;  Laterality: N/A;   AORTOGRAM  08/11/2019   Procedure: Aortogram;  Surgeon: Edward Heck, MD;  Location: [facility];  Service: Vascular;;   ESOPHAGEAL BANDING  08/22/2019   Procedure: ESOPHAGEAL BANDING;  Surgeon: Edward Banister, MD;  Location: [facility] ENDOSCOPY;  Service: Endoscopy;;   ESOPHAGOGASTRODUODENOSCOPY N/A 08/22/2019   Procedure: ESOPHAGOGASTRODUODENOSCOPY (EGD);  Surgeon: Edward Banister, MD;  Location: [facility] ENDOSCOPY;  Service: Endoscopy;  Laterality: N/A;   FEMORAL-POPLITEAL BYPASS GRAFT Left 08/11/2019   Procedure: BYPASS LEFT FEMORAL-DISTAL POPLITEAL ARTERY USING PROPATEN GRAFT;  Surgeon: Edward Heck, MD;  Location: [place];  Service: Vascular;  Laterality: Left;   INSERTION OF DIALYSIS CATHETER Right 09/09/2019   Procedure: INSERTION OF DIALYSIS CATHETER;  Surgeon: Edward Sandy, MD;  Location: [facility];  Service: Vascular;  Laterality: Right;   MULTIPLE TOOTH EXTRACTIONS     PERIPHERAL VASCULAR INTERVENTION  02/06/2019   Procedure: PERIPHERAL VASCULAR INTERVENTION;  Surgeon: Edward Heck, MD;  Location: [facility] CV LAB;  Service: Cardiovascular;;  Bilateral Iliacs   PERIPHERAL VASCULAR INTERVENTION  04/30/2019   Procedure: PERIPHERAL VASCULAR INTERVENTION;  Surgeon: Edward Heck, MD;  Location: [place] CV LAB;  Service: Cardiovascular;;  Stent - Lt. Iliac    REMOVAL OF A DIALYSIS CATHETER Right 09/09/2019   Procedure: Removal Of A Dialysis Catheter;  Surgeon: Edward Sandy, MD;  Location: [facility];  Service: Vascular;  Laterality: Right;   ULTRASOUND GUIDANCE FOR VASCULAR ACCESS Right 09/09/2019   Procedure: Ultrasound Guidance  For Vascular Access;  Surgeon: Edward Sandy, MD;  Location: [facility];  Service: Vascular;  Laterality: Right;   VASCULAR SURGERY      Current Outpatient Medications  Medication Sig Dispense Refill    acetaminophen (TYLENOL) 500 MG tablet Take 1,000 mg by mouth daily as needed for moderate pain or headache.     aspirin EC 81 MG tablet Take 81 mg by mouth daily. Swallow whole.     atorvastatin (LIPITOR) 40 MG tablet Take 40 mg by mouth at bedtime.     furosemide (LASIX) 40 MG tablet Take 1 tablet (40 mg total) by mouth 2 (two) times daily for 7 days. 14 tablet 0   gabapentin (NEURONTIN) 300 MG capsule Take 600 mg by mouth 2 (two) times daily.     nitroGLYCERIN (NITROSTAT) 0.4 MG SL tablet Place 0.4 mg under the tongue every 5 (five) minutes as needed for chest pain.     sacubitril-valsartan (ENTRESTO) 24-26 MG Take 1 tablet by mouth 2 (two) times daily. 60 tablet 6   spironolactone (ALDACTONE) 25 MG tablet Take 0.5 tablets (12.5 mg total) by mouth daily. 15 tablet 6   No current facility-administered medications for this visit.   Allergies:  Other   Social History: The patient  reports that he quit smoking about 2 years ago. His smoking use included cigarettes. He has a 36.00 pack-year smoking history. He has never used smokeless tobacco. He reports that he does not currently use alcohol. He reports that he does not use drugs.   Family History: The patient's family history includes Alcohol abuse in his brother and father; Bipolar disorder in his daughter and son; Cancer in his mother.   ROS:  Please see the history of present illness. Otherwise, complete review of systems is positive for {NONE DEFAULTED:18576}.  All other systems are reviewed and negative.   Physical Exam: VS:  There were no vitals taken for this visit., BMI There is no height or weight on file to calculate BMI.  Wt Readings from Last 3 Encounters:  11/10/21 189 lb 9.5 oz (86 kg)  07/22/21 191 lb 12.8 oz (87 kg)  07/17/21 187 lb 11.2 oz (85.1 kg)    General: Patient appears comfortable at rest. HEENT: Conjunctiva and lids normal, oropharynx clear with moist mucosa. Neck: Supple, no elevated JVP or carotid bruits, no  thyromegaly. Lungs: Clear to auscultation, nonlabored breathing at rest. Cardiac: Regular rate and rhythm, no S3 or significant systolic murmur, no pericardial rub. Abdomen: Soft, nontender, no hepatomegaly, bowel sounds present, no guarding or rebound. Extremities: No pitting edema, distal pulses 2+. Skin: Warm and dry. Musculoskeletal: No kyphosis. Neuropsychiatric: Alert and oriented x3, affect grossly appropriate.  ECG:  An ECG dated 10/14/2021 was personally reviewed today and demonstrated:  Sinus tachycardia with left atrial enlargement, anteroseptal Q waves.  Recent Labwork: 07/17/2021: B Natriuretic Peptide 1,662.0 11/10/2021: ALT 23; AST 46; BUN 27; Creatinine, Ser 1.23; Hemoglobin 12.7; Platelets 136; Potassium 3.8; Sodium 129   Other Studies Reviewed Today:  Echocardiogram 07/05/2021 ([place]): SUMMARY  The left ventricle is mildly dilated.  Moderate left ventricular hypertrophy  Left ventricular systolic function is severely reduced.  LV ejection fraction = 15-20%.  The right ventricle is mild to moderately dilated.  The right ventricular systolic function is moderately reduced.  The left atrium is moderately dilated.  The right atrium is mildly to moderately dilated.  Injection of agitated saline showed no right-to-left shunt.  There is mild mitral regurgitation.  There is moderate tricuspid regurgitation.  Mild pulmonary hypertension.  IVC size was moderately dilated.  There is trivia to smalll pericardial effusion.  There is no comparison study available.    Cardiac catheterization 07/06/2021 ([facility]): Coronary Angiography  1. Left Main -normal  2. Left anterior descending artery -10% mid, 10% distal  3. Diagonals -normal  4. Left Circumflex -25% mid  5. Obtuse Marginals -normal  6. Right Coronary Artery -normal  7. Posterior Descending Artery -normal   Hemodynamics  1. Aortic Pressure -140/70  mmHg  2. Left Ventricular - 140/33 mmHg    CONCLUSIONS:  1. Successful transradial cardiac catheterization  2. Mild nonobstructive coronary artery disease  3. Left ventricular solid pressure 33   Assessment and Plan:    Medication Adjustments/Labs and Tests Ordered: Current medicines are reviewed at length with the patient today.  Concerns regarding medicines are outlined above.   Tests Ordered: No orders of the defined types were placed in this encounter.   Medication Changes: No orders of the defined types were placed in this encounter.   Disposition:  Follow up {follow up:15908}  Signed, Satira Sark, MD, [facility] 12/01/2021 10:19 AM    [facility] at [place], [place], [place] [address] Phone: [phone]; Fax: [phone]

## 2021-12-07 ENCOUNTER — Inpatient Hospital Stay (HOSPITAL_COMMUNITY)
Admission: EM | Admit: 2021-12-07 | Discharge: 2021-12-19 | DRG: 286 | Disposition: A | Discharge status: Home / Self Care | Payer: Medicare Other | Attending: Internal Medicine | Admitting: Internal Medicine

## 2021-12-07 ENCOUNTER — Other Ambulatory Visit: Payer: Self-pay

## 2021-12-07 ENCOUNTER — Encounter (HOSPITAL_COMMUNITY): Payer: Self-pay

## 2021-12-07 ENCOUNTER — Emergency Department (HOSPITAL_COMMUNITY): Payer: Medicare Other

## 2021-12-07 DIAGNOSIS — D61818 Other pancytopenia: Secondary | ICD-10-CM | POA: Diagnosis present

## 2021-12-07 DIAGNOSIS — E8809 Other disorders of plasma-protein metabolism, not elsewhere classified: Secondary | ICD-10-CM | POA: Diagnosis present

## 2021-12-07 DIAGNOSIS — E1122 Type 2 diabetes mellitus with diabetic chronic kidney disease: Secondary | ICD-10-CM | POA: Diagnosis present

## 2021-12-07 DIAGNOSIS — Z452 Encounter for adjustment and management of vascular access device: Secondary | ICD-10-CM | POA: Diagnosis not present

## 2021-12-07 DIAGNOSIS — E782 Mixed hyperlipidemia: Secondary | ICD-10-CM | POA: Diagnosis not present

## 2021-12-07 DIAGNOSIS — I509 Heart failure, unspecified: Secondary | ICD-10-CM | POA: Diagnosis not present

## 2021-12-07 DIAGNOSIS — J811 Chronic pulmonary edema: Secondary | ICD-10-CM | POA: Diagnosis not present

## 2021-12-07 DIAGNOSIS — I3139 Other pericardial effusion (noninflammatory): Secondary | ICD-10-CM | POA: Diagnosis not present

## 2021-12-07 DIAGNOSIS — E1169 Type 2 diabetes mellitus with other specified complication: Secondary | ICD-10-CM | POA: Diagnosis present

## 2021-12-07 DIAGNOSIS — N1832 Chronic kidney disease, stage 3b: Secondary | ICD-10-CM | POA: Diagnosis present

## 2021-12-07 DIAGNOSIS — E871 Hypo-osmolality and hyponatremia: Secondary | ICD-10-CM | POA: Diagnosis not present

## 2021-12-07 DIAGNOSIS — J90 Pleural effusion, not elsewhere classified: Secondary | ICD-10-CM | POA: Diagnosis not present

## 2021-12-07 DIAGNOSIS — I517 Cardiomegaly: Secondary | ICD-10-CM | POA: Diagnosis not present

## 2021-12-07 DIAGNOSIS — E1151 Type 2 diabetes mellitus with diabetic peripheral angiopathy without gangrene: Secondary | ICD-10-CM | POA: Diagnosis present

## 2021-12-07 DIAGNOSIS — I70202 Unspecified atherosclerosis of native arteries of extremities, left leg: Secondary | ICD-10-CM | POA: Diagnosis not present

## 2021-12-07 DIAGNOSIS — J449 Chronic obstructive pulmonary disease, unspecified: Secondary | ICD-10-CM | POA: Diagnosis not present

## 2021-12-07 DIAGNOSIS — R7989 Other specified abnormal findings of blood chemistry: Secondary | ICD-10-CM

## 2021-12-07 DIAGNOSIS — R0602 Shortness of breath: Secondary | ICD-10-CM

## 2021-12-07 DIAGNOSIS — K7460 Unspecified cirrhosis of liver: Secondary | ICD-10-CM | POA: Diagnosis present

## 2021-12-07 DIAGNOSIS — E1159 Type 2 diabetes mellitus with other circulatory complications: Secondary | ICD-10-CM | POA: Diagnosis present

## 2021-12-07 DIAGNOSIS — Z79899 Other long term (current) drug therapy: Secondary | ICD-10-CM

## 2021-12-07 DIAGNOSIS — N179 Acute kidney failure, unspecified: Secondary | ICD-10-CM | POA: Diagnosis not present

## 2021-12-07 DIAGNOSIS — K761 Chronic passive congestion of liver: Secondary | ICD-10-CM | POA: Diagnosis present

## 2021-12-07 DIAGNOSIS — K219 Gastro-esophageal reflux disease without esophagitis: Secondary | ICD-10-CM | POA: Diagnosis present

## 2021-12-07 DIAGNOSIS — I071 Rheumatic tricuspid insufficiency: Secondary | ICD-10-CM | POA: Diagnosis present

## 2021-12-07 DIAGNOSIS — Z20822 Contact with and (suspected) exposure to covid-19: Secondary | ICD-10-CM | POA: Diagnosis not present

## 2021-12-07 DIAGNOSIS — K7469 Other cirrhosis of liver: Secondary | ICD-10-CM | POA: Diagnosis not present

## 2021-12-07 DIAGNOSIS — J9601 Acute respiratory failure with hypoxia: Secondary | ICD-10-CM | POA: Diagnosis not present

## 2021-12-07 DIAGNOSIS — N1831 Chronic kidney disease, stage 3a: Secondary | ICD-10-CM | POA: Insufficient documentation

## 2021-12-07 DIAGNOSIS — E039 Hypothyroidism, unspecified: Secondary | ICD-10-CM | POA: Diagnosis not present

## 2021-12-07 DIAGNOSIS — I130 Hypertensive heart and chronic kidney disease with heart failure and stage 1 through stage 4 chronic kidney disease, or unspecified chronic kidney disease: Principal | ICD-10-CM | POA: Diagnosis present

## 2021-12-07 DIAGNOSIS — K7030 Alcoholic cirrhosis of liver without ascites: Secondary | ICD-10-CM | POA: Diagnosis present

## 2021-12-07 DIAGNOSIS — I428 Other cardiomyopathies: Secondary | ICD-10-CM | POA: Diagnosis present

## 2021-12-07 DIAGNOSIS — I2510 Atherosclerotic heart disease of native coronary artery without angina pectoris: Secondary | ICD-10-CM | POA: Diagnosis present

## 2021-12-07 DIAGNOSIS — Z95820 Peripheral vascular angioplasty status with implants and grafts: Secondary | ICD-10-CM

## 2021-12-07 DIAGNOSIS — F1011 Alcohol abuse, in remission: Secondary | ICD-10-CM | POA: Diagnosis present

## 2021-12-07 DIAGNOSIS — D6959 Other secondary thrombocytopenia: Secondary | ICD-10-CM | POA: Diagnosis present

## 2021-12-07 DIAGNOSIS — I872 Venous insufficiency (chronic) (peripheral): Secondary | ICD-10-CM | POA: Diagnosis present

## 2021-12-07 DIAGNOSIS — I110 Hypertensive heart disease with heart failure: Secondary | ICD-10-CM | POA: Diagnosis not present

## 2021-12-07 DIAGNOSIS — I739 Peripheral vascular disease, unspecified: Secondary | ICD-10-CM | POA: Diagnosis present

## 2021-12-07 DIAGNOSIS — R946 Abnormal results of thyroid function studies: Secondary | ICD-10-CM | POA: Diagnosis present

## 2021-12-07 DIAGNOSIS — D696 Thrombocytopenia, unspecified: Secondary | ICD-10-CM | POA: Diagnosis not present

## 2021-12-07 DIAGNOSIS — G8929 Other chronic pain: Secondary | ICD-10-CM | POA: Diagnosis not present

## 2021-12-07 DIAGNOSIS — I5023 Acute on chronic systolic (congestive) heart failure: Secondary | ICD-10-CM | POA: Diagnosis present

## 2021-12-07 DIAGNOSIS — E785 Hyperlipidemia, unspecified: Secondary | ICD-10-CM | POA: Diagnosis not present

## 2021-12-07 DIAGNOSIS — Z87891 Personal history of nicotine dependence: Secondary | ICD-10-CM

## 2021-12-07 DIAGNOSIS — N189 Chronic kidney disease, unspecified: Secondary | ICD-10-CM | POA: Diagnosis not present

## 2021-12-07 DIAGNOSIS — Z7982 Long term (current) use of aspirin: Secondary | ICD-10-CM

## 2021-12-07 LAB — BASIC METABOLIC PANEL
Anion gap: 8 (ref 5–15)
BUN: 31 mg/dL — ABNORMAL HIGH (ref 6–20)
CO2: 23 mmol/L (ref 22–32)
Calcium: 8.8 mg/dL — ABNORMAL LOW (ref 8.9–10.3)
Chloride: 97 mmol/L — ABNORMAL LOW (ref 98–111)
Creatinine, Ser: 1.52 mg/dL — ABNORMAL HIGH (ref 0.61–1.24)
GFR, Estimated: 53 mL/min — ABNORMAL LOW (ref >60)
Glucose, Bld: 111 mg/dL — ABNORMAL HIGH (ref 70–99)
Potassium: 4.5 mmol/L (ref 3.5–5.1)
Sodium: 128 mmol/L — ABNORMAL LOW (ref 135–145)

## 2021-12-07 LAB — RESP PANEL BY RT-PCR (FLU A&B, COVID) ARPGX2
Influenza A by PCR: NEGATIVE
Influenza B by PCR: NEGATIVE
SARS Coronavirus 2 by RT PCR: NEGATIVE

## 2021-12-07 LAB — PROTIME-INR
INR: 1.3 — ABNORMAL HIGH (ref 0.8–1.2)
Prothrombin Time: 15.9 seconds — ABNORMAL HIGH (ref 11.4–15.2)

## 2021-12-07 LAB — CBC WITH DIFFERENTIAL/PLATELET
Abs Immature Granulocytes: 0.01 10*3/uL (ref 0.00–0.07)
Basophils Absolute: 0.1 10*3/uL (ref 0.0–0.1)
Basophils Relative: 1 %
Eosinophils Absolute: 0.1 10*3/uL (ref 0.0–0.5)
Eosinophils Relative: 3 %
HCT: 39.5 % (ref 39.0–52.0)
Hemoglobin: 12.9 g/dL — ABNORMAL LOW (ref 13.0–17.0)
Immature Granulocytes: 0 %
Lymphocytes Relative: 36 %
Lymphs Abs: 1.5 10*3/uL (ref 0.7–4.0)
MCH: 27.1 pg (ref 26.0–34.0)
MCHC: 32.7 g/dL (ref 30.0–36.0)
MCV: 83 fL (ref 80.0–100.0)
Monocytes Absolute: 0.3 10*3/uL (ref 0.1–1.0)
Monocytes Relative: 8 %
Neutro Abs: 2.1 10*3/uL (ref 1.7–7.7)
Neutrophils Relative %: 52 %
Platelets: 132 10*3/uL — ABNORMAL LOW (ref 150–400)
RBC: 4.76 MIL/uL (ref 4.22–5.81)
RDW: 16.5 % — ABNORMAL HIGH (ref 11.5–15.5)
WBC: 4.1 10*3/uL (ref 4.0–10.5)
nRBC: 0 % (ref 0.0–0.2)

## 2021-12-07 LAB — BRAIN NATRIURETIC PEPTIDE: B Natriuretic Peptide: 1324 pg/mL — ABNORMAL HIGH (ref 0.0–100.0)

## 2021-12-07 LAB — TROPONIN I (HIGH SENSITIVITY)
Troponin I (High Sensitivity): 21 ng/L — ABNORMAL HIGH (ref <18)
Troponin I (High Sensitivity): 19 ng/L — ABNORMAL HIGH (ref <18)

## 2021-12-07 MED ORDER — SODIUM CHLORIDE 0.9% FLUSH
3.0000 mL | Freq: Two times a day (BID) | INTRAVENOUS | Status: DC
  Administered 2021-12-07 – 2021-12-17 (×16): 3 mL via INTRAVENOUS

## 2021-12-07 MED ORDER — SODIUM CHLORIDE 0.9% FLUSH: 3.0000 mL | INTRAVENOUS | Status: DC | PRN

## 2021-12-07 MED ORDER — ATORVASTATIN CALCIUM 40 MG PO TABS
40.0000 mg | ORAL_TABLET | Freq: Every day | ORAL | Status: DC
  Administered 2021-12-07 – 2021-12-18 (×12): 40 mg via ORAL
  Filled 2021-12-07 (×12): qty 1

## 2021-12-07 MED ORDER — SPIRONOLACTONE 25 MG PO TABS
12.5000 mg | ORAL_TABLET | Freq: Every day | ORAL | Status: DC
  Administered 2021-12-07 – 2021-12-10 (×4): 12.5 mg via ORAL
  Filled 2021-12-07 (×3): qty 1
  Filled 2021-12-07: qty 0.5
  Filled 2021-12-07: qty 1
  Filled 2021-12-07 (×4): qty 0.5

## 2021-12-07 MED ORDER — ASPIRIN EC 81 MG PO TBEC
81.0000 mg | DELAYED_RELEASE_TABLET | Freq: Every day | ORAL | Status: DC
Start: 2021-12-07 — End: 2021-12-19
  Administered 2021-12-07 – 2021-12-19 (×13): 81 mg via ORAL
  Filled 2021-12-07 (×13): qty 1

## 2021-12-07 MED ORDER — FUROSEMIDE 10 MG/ML IJ SOLN
80.0000 mg | Freq: Two times a day (BID) | INTRAMUSCULAR | Status: DC
  Administered 2021-12-07 – 2021-12-11 (×9): 80 mg via INTRAVENOUS
  Filled 2021-12-07 (×9): qty 8

## 2021-12-07 MED ORDER — CARVEDILOL 3.125 MG PO TABS
6.2500 mg | ORAL_TABLET | Freq: Two times a day (BID) | ORAL | Status: DC
  Administered 2021-12-07 – 2021-12-09 (×4): 6.25 mg via ORAL
  Filled 2021-12-07 (×4): qty 2

## 2021-12-07 MED ORDER — ONDANSETRON HCL 4 MG/2ML IJ SOLN: 4.0000 mg | Freq: Four times a day (QID) | INTRAMUSCULAR | Status: DC | PRN

## 2021-12-07 MED ORDER — ACETAMINOPHEN 325 MG PO TABS
650.0000 mg | ORAL_TABLET | ORAL | Status: DC | PRN
  Administered 2021-12-13 – 2021-12-16 (×4): 650 mg via ORAL
  Filled 2021-12-07 (×4): qty 2

## 2021-12-07 MED ORDER — ENOXAPARIN SODIUM 40 MG/0.4ML IJ SOSY
40.0000 mg | PREFILLED_SYRINGE | Freq: Every day | INTRAMUSCULAR | Status: DC
  Administered 2021-12-07 – 2021-12-18 (×12): 40 mg via SUBCUTANEOUS
  Filled 2021-12-07 (×12): qty 0.4

## 2021-12-07 MED ORDER — FUROSEMIDE 10 MG/ML IJ SOLN
80.0000 mg | Freq: Once | INTRAMUSCULAR | Status: AC
  Administered 2021-12-07: 80 mg via INTRAVENOUS
  Filled 2021-12-07: qty 8

## 2021-12-07 MED ORDER — SODIUM CHLORIDE 0.9 % IV SOLN
250.0000 mL | INTRAVENOUS | Status: DC | PRN
  Administered 2021-12-13 – 2021-12-14 (×2): 250 mL via INTRAVENOUS

## 2021-12-07 NOTE — Assessment & Plan Note (Addendum)
On antiplatelet therapy Patient has lower extremity pain, in the setting of low cardiac output heart failure.  -On as needed oxycodone

## 2021-12-07 NOTE — Assessment & Plan Note (Deleted)
Secondary to chronic hepatic congestion and alcohol use Usual range 120-130 Monitor for signs of bleeding

## 2021-12-07 NOTE — H&P (Signed)
History and Physical    Patient: Edward Rocha. KDT:[id number] DOB: 1966-02-14 DOA: 12/07/2021 DOS: the patient was seen and examined on 12/07/2021 PCP: Neale Burly, MD  Patient coming from: Home  Chief Complaint:  Chief Complaint  Patient presents with   Shortness of Breath    HPI: Edward Dec. is a 56 y.o. male with medical history significant of  56 year old male with a history of diabetes mellitus type 2, NICM, peripheral arterial disease, COPD, alcohol abuse in remission presenting with 1 week history of shortness of breath that significantly worsened over the past 4 days.  The patient states that he has had increasing orthopnea, increasing lower extremity edema, and increasing abdominal girth.  He endorses compliance with his furosemide 80 mg daily.  Interestingly, the patient has not followed up with cardiology, Dr. Rozann Lesches since 07/22/2021.  The patient is unclear where he is actually getting his refills for his furosemide, but nevertheless states that he has been compliant with his furosemide.  Also, he endorses compliance with some other cardiac med the name of which he does not recall, but again does not know who has been refilling his medication.  However, the patient states that he is thirsty and drinks "plenty of fluid".  He states that he drinks at least three 20 ounces of water bottles in addition to the fluid during the period of the day.  He denies any fevers, chills, headache, chest pain, abdominal pain, dysuria, hematuria, hematochezia, melena.  He did have 1 episode of nausea and vomiting on 12/06/2021, but has not had any emesis since then.  He complains of a largely nonproductive cough but occasionally does bring up some yellow sputum.  He denies any hemoptysis. Notably, the patient was recently admitted to [facility] from 10/15/2021 to 10/21/2021 when he was treated for decompensated HFrEF.  He was diuresed with IV furosemide and metolazone.  Repeat  echocardiogram (TTE) obtained during that hospital admission showed an EF of 15 to 20% with mild to moderate dilated RV and RV systolic function reduced.  He was also treated for influenza A and COPD exacerbation.  He was discharged home with furosemide 80 mg twice daily, then down to 80 mg once daily.  During that hospitalization, he was started on carvedilol.  His losartan and metoprolol succinate were discontinued.  He was -15 L after diuresis during the hospitalization.  Serum creatinine was 1.66 at the time of discharge. ED In the ED, the patient was afebrile hemodynamically stable with oxygen saturation 90% on room air.  BMP showed sodium 128, potassium 4.5, serum creatinine 1.52.  WBC 4.1, hemoglobin 12.9, platelets 132,000.  Chest x-[place] showed cardiomegaly with pulmonary vascular congestion.  EKG shows sinus tachycardia with nonspecific ST changes.  BNP was 1224.  The patient was given furosemide 80 mg IV x1.  He was admitted for further evaluation and treatment of his CHF.   Review of Systems: As mentioned in the history of present illness. All other systems reviewed and are negative. Past Medical History:  Diagnosis Date   Acute on chronic renal failure (HCC)    Essential hypertension    GERD (gastroesophageal reflux disease)    Hyperosmolar syndrome 2020   Hypothyroidism    Mild coronary artery disease    Cardiac catheterization September 2022 - [facility]   Nonischemic cardiomyopathy [facility])    PAD (peripheral artery disease) (HCC)    Thrombocytopenia ([place])    Type 2 diabetes mellitus ([place])  Past Surgical History:  Procedure Laterality Date   ABDOMINAL AORTOGRAM W/LOWER EXTREMITY N/A 02/06/2019   Procedure: ABDOMINAL AORTOGRAM W/LOWER EXTREMITY;  Surgeon: [name], MD;  Location: [place] [facility];  Service: Cardiovascular;  Laterality: N/A;  bilateral   ABDOMINAL AORTOGRAM W/LOWER EXTREMITY N/A 04/30/2019   Procedure: ABDOMINAL AORTOGRAM W/LOWER EXTREMITY;  Surgeon: [name], MD;  Location: [place] [facility];  Service: Cardiovascular;  Laterality: N/A;   AORTOGRAM  08/11/2019   Procedure: Aortogram;  Surgeon: [name], MD;  Location: [place];  Service: Vascular;;   ESOPHAGEAL BANDING  08/22/2019   Procedure: ESOPHAGEAL BANDING;  Surgeon: Milus Banister, MD;  Location: [facility] ENDOSCOPY;  Service: Endoscopy;;   ESOPHAGOGASTRODUODENOSCOPY N/A 08/22/2019   Procedure: ESOPHAGOGASTRODUODENOSCOPY (EGD);  Surgeon: Milus Banister, MD;  Location: [facility] ENDOSCOPY;  Service: Endoscopy;  Laterality: N/A;   FEMORAL-POPLITEAL BYPASS GRAFT Left 08/11/2019   Procedure: BYPASS LEFT FEMORAL-DISTAL POPLITEAL ARTERY USING PROPATEN GRAFT;  Surgeon: [name], MD;  Location: [place];  Service: Vascular;  Laterality: Left;   INSERTION OF DIALYSIS CATHETER Right 09/09/2019   Procedure: INSERTION OF DIALYSIS CATHETER;  Surgeon: Waynetta Sandy, MD;  Location: [place];  Service: Vascular;  Laterality: Right;   MULTIPLE TOOTH EXTRACTIONS     PERIPHERAL VASCULAR INTERVENTION  02/06/2019   Procedure: PERIPHERAL VASCULAR INTERVENTION;  Surgeon: [name], MD;  Location: [place] [facility];  Service: Cardiovascular;;  Bilateral Iliacs   PERIPHERAL VASCULAR INTERVENTION  04/30/2019   Procedure: PERIPHERAL VASCULAR INTERVENTION;  Surgeon: [name], MD;  Location: [place] [facility];  Service: Cardiovascular;;  Stent - Lt. Iliac    REMOVAL OF A DIALYSIS CATHETER Right 09/09/2019   Procedure: Removal Of A Dialysis Catheter;  Surgeon: Waynetta Sandy, MD;  Location: [place];  Service: Vascular;  Laterality: Right;   ULTRASOUND GUIDANCE FOR VASCULAR ACCESS Right 09/09/2019   Procedure: Ultrasound Guidance For Vascular Access;  Surgeon: Waynetta Sandy, MD;  Location: [place];  Service: Vascular;  Laterality: Right;   VASCULAR SURGERY     Social History:  reports that he quit smoking about 2 years ago. His smoking use included  cigarettes. He has a 36.00 pack-year smoking history. He has never used smokeless tobacco. He reports that he does not currently use alcohol. He reports that he does not use drugs.  Allergies  Allergen Reactions   Other     Pt received platelets and had a bad reaction from infusion     Family History  Problem Relation Age of Onset   Alcohol abuse Father    Cancer Mother    Alcohol abuse Brother    Bipolar disorder Son    Bipolar disorder Daughter     Prior to Admission medications   Medication Sig Start Date End Date Taking? Authorizing Provider  acetaminophen (TYLENOL) 500 MG tablet Take 1,000 mg by mouth daily as needed for moderate pain or headache.    [provider]  aspirin EC 81 MG tablet Take 81 mg by mouth daily. Swallow whole.    [provider]  atorvastatin (LIPITOR) 40 MG tablet Take 40 mg by mouth at bedtime.    [provider]  furosemide (LASIX) 40 MG tablet Take 1 tablet (40 mg total) by mouth 2 (two) times daily for 7 days. 11/10/21 11/17/21  Noemi Chapel, MD  gabapentin (NEURONTIN) 300 MG capsule Take 600 mg by mouth 2 (two) times daily. 09/26/21   [provider]  nitroGLYCERIN (NITROSTAT) 0.4 MG SL tablet Place 0.4 mg under the tongue every 5 (five) minutes as needed for chest pain.    [provider]  sacubitril-valsartan (ENTRESTO) 24-26 MG Take 1 tablet by mouth 2 (two) times daily. 07/22/21   Satira Sark, MD  spironolactone (ALDACTONE) 25 MG tablet Take 0.5 tablets (12.5 mg total) by mouth daily. 07/22/21   Satira Sark, MD    Physical Exam: Vitals:   12/07/21 0930 12/07/21 1030 12/07/21 1100 12/07/21 1130  BP: (!) 137/108 (!) 138/113 (!) 131/104 (!) 130/107  Pulse: (!) 104 95 (!) 103 100  Resp: (!) 23 15 (!) 29 20  Temp:      TempSrc:      SpO2: 100% 100% 100% 100%  Weight:      Height:      General-A&O x 3, NAD, well developed HEENT-[place]/AT, +JVD, soft/supple, no mass Neck--soft/supple, no  mass CV-RRR, no rub, no S4 Lung-bibasilar crackles. No wheeze Abd-soft/NT+BS Ext-2+edema, no CC, no lymphangitis Neuro:  CN II-XII intact, strength 4/5 in RUE, RLE, strength 4/5 LUE, LLE; sensation intact bilateral; no dysmetria; babinski equivocal   Data Reviewed: As discussed in HPI  Assessment and Plan: * Acute on chronic systolic CHF (congestive heart failure) ([place])- (present on admission) Continue furosemide 80 mg IV twice daily Cardiology consult Apparently his Edward Rocha was discontinued during his last hospital admission at [facility] Carvedilol was started and lieu of losartan and metoprolol succinate 10/15/2021 echo EF 15 to 20%, moderately reduced RV, moderate TR, mild pulmonary hypertension Accurate I's and O's Daily weights  Stage 3a chronic kidney disease (CKD) (HCC) Baseline creatinine 1.5-1.7 -Monitor with diuresis  Mixed hyperlipidemia Continue statin  PAD (peripheral artery disease) ([place])- (present on admission) No claudication symptoms presently Continue aspirin  Thrombocytopenia ([place])- (present on admission) Secondary to chronic hepatic congestion and alcohol use Usual range 120-130       Advance Care Planning: FULL CODE  Consults: cardiology  Family Communication: no family bedside  Severity of Illness: The appropriate patient status for this patient is INPATIENT. Inpatient status is judged to be reasonable and necessary in order to provide the required intensity of service to ensure the patient's safety. The patient's presenting symptoms, physical exam findings, and initial radiographic and laboratory data in the context of their chronic comorbidities is felt to place them at high risk for further clinical deterioration. Furthermore, it is not anticipated that the patient will be medically stable for discharge from the hospital within 2 midnights of admission.   * I certify that at the point of admission it is my clinical judgment that  the patient will require inpatient hospital care spanning beyond 2 midnights from the point of admission due to high intensity of service, high risk for further deterioration and high frequency of surveillance required.*  Author: Orson Eva, MD 12/07/2021 12:27 PM  For on call review [email or web address].

## 2021-12-07 NOTE — Assessment & Plan Note (Signed)
Continue statin. 

## 2021-12-07 NOTE — ED Provider Notes (Signed)
[facility] EMERGENCY DEPARTMENT Provider Note   CSN: [id number] Arrival date & time: 12/07/21  0845     History  Chief Complaint  Patient presents with   Shortness of Breath    Edward Rocha. is a 56 y.o. male with a history including type 2 diabetes, PAD, COPD, CHF, CAD presenting for evaluation of increasing shortness of breath over the past week.  He also reports increasing peripheral edema, difficulty lying flat secondary to shortness of breath and he also reports a 9 pound weight gain.  He does have occasional wheezing but feels this is more of a fluid issue than a COPD issue.  He has not had any of his medications this morning.  He denies fevers or chills.  He has had a cough with occasional clear to yellow sputum.  He denies chest pain.  He has taken his albuterol MDI several doses yesterday without much improvement.  The history is provided by the patient.      Home Medications Prior to Admission medications   Medication Sig Start Date End Date Taking? Authorizing Provider  acetaminophen (TYLENOL) 500 MG tablet Take 1,000 mg by mouth daily as needed for moderate pain or headache.   Yes [provider]  albuterol (VENTOLIN HFA) 108 (90 Base) MCG/ACT inhaler Inhale 1-2 puffs into the lungs every 6 (six) hours as needed for wheezing or shortness of breath.   Yes [provider]  aspirin EC 81 MG tablet Take 81 mg by mouth daily. Swallow whole.   Yes [provider]  atorvastatin (LIPITOR) 40 MG tablet Take 40 mg by mouth at bedtime.   Yes [provider]  furosemide (LASIX) 40 MG tablet Take 1 tablet (40 mg total) by mouth 2 (two) times daily for 7 days. 11/10/21 12/07/21 Yes Noemi Chapel, MD  gabapentin (NEURONTIN) 300 MG capsule Take 600 mg by mouth 2 (two) times daily. 09/26/21  Yes [provider]  nitroGLYCERIN (NITROSTAT) 0.4 MG SL tablet Place 0.4 mg under the tongue every 5 (five) minutes as needed for chest pain.   Yes [provider]  spironolactone (ALDACTONE) 25 MG tablet Take 0.5 tablets (12.5 mg total) by mouth daily. 07/22/21  Yes Satira Sark, MD  sacubitril-valsartan (ENTRESTO) 24-26 MG Take 1 tablet by mouth 2 (two) times daily. Patient not taking: Reported on 12/07/2021 07/22/21   Satira Sark, MD      Allergies    Other    Review of Systems   Review of Systems  Constitutional:  Negative for chills and fever.  HENT:  Negative for congestion and sore throat.   Eyes: Negative.   Respiratory:  Positive for cough and shortness of breath. Negative for chest tightness.   Cardiovascular:  Positive for leg swelling. Negative for chest pain and palpitations.  Gastrointestinal:  Negative for abdominal pain, nausea and vomiting.  Genitourinary: Negative.   Musculoskeletal:  Negative for arthralgias, joint swelling and neck pain.  Skin: Negative.  Negative for rash and wound.  Neurological:  Negative for dizziness, weakness, light-headedness, numbness and headaches.  Psychiatric/Behavioral: Negative.    All other systems reviewed and are negative.  Physical Exam Updated Vital Signs BP (!) 130/107    Pulse 100    Temp 97.9 F (36.6 C) (Oral)    Resp 20    Ht 6\' 1"  (1.854 m)    Wt 91.2 kg    SpO2 100%    BMI 26.52 kg/m  Physical Exam Vitals and nursing  note reviewed.  Constitutional:      Appearance: He is well-developed.  HENT:     Head: Normocephalic and atraumatic.  Eyes:     Conjunctiva/sclera: Conjunctivae normal.  Cardiovascular:     Rate and Rhythm: Regular rhythm. Tachycardia present.     Heart sounds: Normal heart sounds.     Comments: Bilateral JVD Pulmonary:     Effort: Tachypnea present.     Breath sounds: Decreased breath sounds and rales present. No wheezing.  Abdominal:     General: Bowel sounds are normal.     Palpations: Abdomen is soft.     Tenderness: There is no abdominal tenderness.  Musculoskeletal:        General: Normal range of motion.     Cervical back:  Normal range of motion.     Comments: Bilateral lower extremity pitting edema.  Skin:    General: Skin is warm and dry.  Neurological:     Mental Status: He is alert.    ED Results / Procedures / Treatments   Labs (all labs ordered are listed, but only abnormal results are displayed) Labs Reviewed  BASIC METABOLIC PANEL - Abnormal; Notable for the following components:      Result Value   Sodium 128 (*)    Chloride 97 (*)    Glucose, Bld 111 (*)    BUN 31 (*)    Creatinine, Ser 1.52 (*)    Calcium 8.8 (*)    GFR, Estimated 53 (*)    All other components within normal limits  PROTIME-INR - Abnormal; Notable for the following components:   Prothrombin Time 15.9 (*)    INR 1.3 (*)    All other components within normal limits  BRAIN NATRIURETIC PEPTIDE - Abnormal; Notable for the following components:   B Natriuretic Peptide 1,324.0 (*)    All other components within normal limits  CBC WITH DIFFERENTIAL/PLATELET - Abnormal; Notable for the following components:   Hemoglobin 12.9 (*)    RDW 16.5 (*)    Platelets 132 (*)    All other components within normal limits  TROPONIN I (HIGH SENSITIVITY) - Abnormal; Notable for the following components:   Troponin I (High Sensitivity) 21 (*)    All other components within normal limits  TROPONIN I (HIGH SENSITIVITY) - Abnormal; Notable for the following components:   Troponin I (High Sensitivity) 19 (*)    All other components within normal limits  RESP PANEL BY RT-PCR (FLU A&B, COVID) ARPGX2  CBC WITH DIFFERENTIAL/PLATELET  HEMOGLOBIN A1C  HIV ANTIBODY (ROUTINE TESTING W REFLEX)    EKG EKG Interpretation  Date/Time:  Wednesday December 07 2021 08:56:24 EST Ventricular Rate:  106 PR Interval:  173 QRS Duration: 95 QT Interval:  354 QTC Calculation: 471 R Axis:   63 Text Interpretation: Sinus tachycardia LAE, consider biatrial enlargement Borderline low voltage, extremity leads Confirmed by Milton Ferguson [phone]) on 12/07/2021  11:40:29 AM  Radiology DG Chest Port 1 View  Result Date: 12/07/2021 CLINICAL DATA:  Shortness of breath, fluid retention EXAM: PORTABLE CHEST 1 VIEW COMPARISON:  11/10/2021 FINDINGS: Moderate cardiomegaly, stable from prior. Aortic atherosclerosis. Mild pulmonary vascular congestion with slight bilateral perihilar interstitial prominence. Small right pleural effusion. No pneumothorax. IMPRESSION: Cardiomegaly with pulmonary vascular congestion and small right pleural effusion. Electronically Signed   By: Davina Poke D.O.   On: 12/07/2021 09:41    Procedures Procedures    Medications Ordered in ED Medications  spironolactone (ALDACTONE) tablet 12.5 mg (has no administration in  time range)  aspirin EC tablet 81 mg (has no administration in time range)  atorvastatin (LIPITOR) tablet 40 mg (has no administration in time range)  sodium chloride flush (NS) 0.9 % injection 3 mL (has no administration in time range)  sodium chloride flush (NS) 0.9 % injection 3 mL (has no administration in time range)  0.9 %  sodium chloride infusion (has no administration in time range)  acetaminophen (TYLENOL) tablet 650 mg (has no administration in time range)  ondansetron (ZOFRAN) injection 4 mg (has no administration in time range)  enoxaparin (LOVENOX) injection 40 mg (has no administration in time range)  furosemide (LASIX) injection 80 mg (has no administration in time range)  carvedilol (COREG) tablet 6.25 mg (has no administration in time range)  furosemide (LASIX) injection 80 mg (80 mg Intravenous Given 12/07/21 0933)    ED Course/ Medical Decision Making/ A&P                           Medical Decision Making Patient with increasing shortness of breath along with a 9 pound weight gain, increasing bilateral peripheral edema, orthopnea, JVD on exam.   Amount and/or Complexity of Data Reviewed Labs: ordered.    Details: Significant labs including a mild hyponatremia at 128, he does have some  renal insufficiency with a creatinine of 1.52, his BUN is also bumped at 31.  He has a BNP of 1324.  He has a slight elevation in his troponin at 21.  Second troponin is pending at this time.  He denies chest pain. Radiology: ordered.    Details: Chest x-[place] confirms fluid overload, pulmonary vascular congestion with a right pleural effusion. ECG/medicine tests: ordered. Discussion of management or test interpretation with external provider(s): Patient was given Lasix 80 mg IV, he has urinated approximately 100 cc of urine, he will need significant further diuresis.  We will plan for admission, call placed to the hospitalist.  Discussed with Dr. Carles Collet who accepts pt for admission.  Risk Prescription drug management. Decision regarding hospitalization.           Final Clinical Impression(s) / ED Diagnoses Final diagnoses:  Acute on chronic congestive heart failure, unspecified heart failure type [facility])    Rx / DC Orders ED Discharge Orders     None         Landis Martins 12/07/21 1243    Milton Ferguson, MD 12/08/21 1630

## 2021-12-07 NOTE — Assessment & Plan Note (Deleted)
Continue furosemide 80 mg IV twice daily Remains clinically fluid overloaded 2/9--give dose of metolazone 2.5 mg daily Apparently his Delene Loll was discontinued during his last hospital admission at [facility]>>Carvedilol was started and lieu of losartan and metoprolol succinate  Cardiology consult appreciated>>restart Delene Loll and spironolactone 2/10 10/15/2021 echo EF 15 to 20%, moderately reduced RV, moderate TR, mild pulmonary hypertension Accurate I's and O's--NEG 5.1L in past 72 hrs Daily weights Hold coreg and spironolactone for soft BPs 12/10/21--hold entresto and spironolactone due to rise in serum creatinine -transfer [facility] for Advance CHF team eval

## 2021-12-07 NOTE — ED Triage Notes (Signed)
Patient with complaints of shortness of breath and fluid retention. The episode started 4 days prior and has gotten worse.

## 2021-12-07 NOTE — Hospital Course (Addendum)
Mr. Quinley was admitted to the hospital with the working diagnosis of decompensated heart failure.  Transferred from [facility] to [facility] for further cardiac evaluation.   56 year old male with a history of diabetes mellitus type 2, NICM, peripheral arterial disease, COPD, alcohol abuse in remission presenting with 1 week history of shortness of breath that significantly worsened over the past 4 days.  Positive orthopnea, increasing lower extremity edema, and increasing abdominal girth.    Recently admitted to [facility] from 10/15/2021 to 10/21/2021 when he was treated for decompensated HFrEF.  He was diuresed with IV furosemide and metolazone.  Repeat echocardiogram (TTE) obtained during that hospital admission showed an EF of 15 to 20% with mild to moderate dilated RV and RV systolic function reduced.  He was also treated for influenza A and COPD exacerbation.    In the ED, the patient was afebrile hemodynamically stable with oxygen saturation 90% on room air.  BMP showed sodium 128, potassium 4.5, serum creatinine 1.52.  WBC 4.1, hemoglobin 12.9, platelets 132,000.  Chest x-[place] showed cardiomegaly with pulmonary vascular congestion.    EKG shows sinus tachycardia with nonspecific ST changes.  BNP was 1224.  The patient was given furosemide 80 mg IV x1.  He was admitted for further evaluation and treatment of his CHF.  He was continued on lasix 80 mg IV bid.  Cardiology was consulted to assist with management. Carvedilol and Entresto were restarted along with spironolactone.  Unfortunately, the patient's blood pressure remained soft with SBP's in the low 90s and upper 80s.  As a result, the patient spironolactone and carvedilol were discontinued.  The patient serum creatinine gradually worsened from 1.5-2.8.  Unfortunately, the patient remained significantly fluid overloaded.  As result, there was concern for cardiorenal syndrome and the need for advanced heart failure care.    Therefore, the patient was  transferred to [facility] for heart failure team to evaluate.   02/13 cardiac catheterization with elevated filling pressures and low cardiac output. Placed on milrinone and furosemide continuous infusions and transferred to [facility].

## 2021-12-07 NOTE — Assessment & Plan Note (Deleted)
Baseline creatinine 1.5-1.7 -Monitor with diuresis

## 2021-12-08 DIAGNOSIS — R0601 Orthopnea: Secondary | ICD-10-CM

## 2021-12-08 DIAGNOSIS — J9601 Acute respiratory failure with hypoxia: Secondary | ICD-10-CM

## 2021-12-08 DIAGNOSIS — E871 Hypo-osmolality and hyponatremia: Secondary | ICD-10-CM | POA: Diagnosis present

## 2021-12-08 LAB — BASIC METABOLIC PANEL
Anion gap: 8 (ref 5–15)
BUN: 35 mg/dL — ABNORMAL HIGH (ref 6–20)
CO2: 21 mmol/L — ABNORMAL LOW (ref 22–32)
Calcium: 8.6 mg/dL — ABNORMAL LOW (ref 8.9–10.3)
Chloride: 100 mmol/L (ref 98–111)
Creatinine, Ser: 1.55 mg/dL — ABNORMAL HIGH (ref 0.61–1.24)
GFR, Estimated: 52 mL/min — ABNORMAL LOW (ref >60)
Glucose, Bld: 107 mg/dL — ABNORMAL HIGH (ref 70–99)
Potassium: 4.1 mmol/L (ref 3.5–5.1)
Sodium: 129 mmol/L — ABNORMAL LOW (ref 135–145)

## 2021-12-08 LAB — MAGNESIUM: Magnesium: 2 mg/dL (ref 1.7–2.4)

## 2021-12-08 LAB — HIV ANTIBODY (ROUTINE TESTING W REFLEX): HIV Screen 4th Generation wRfx: NONREACTIVE

## 2021-12-08 LAB — HEMOGLOBIN A1C
Hgb A1c MFr Bld: 6.9 % — ABNORMAL HIGH (ref 4.8–5.6)
Mean Plasma Glucose: 151 mg/dL

## 2021-12-08 MED ORDER — METOLAZONE 5 MG PO TABS
2.5000 mg | ORAL_TABLET | Freq: Once | ORAL | Status: AC
  Administered 2021-12-08: 2.5 mg via ORAL
  Filled 2021-12-08: qty 1

## 2021-12-08 MED ORDER — MELATONIN 3 MG PO TABS
6.0000 mg | ORAL_TABLET | Freq: Every day | ORAL | Status: DC
  Administered 2021-12-08 – 2021-12-18 (×11): 6 mg via ORAL
  Filled 2021-12-08 (×11): qty 2

## 2021-12-08 MED ORDER — MELATONIN 5 MG PO TABS: 5.0000 mg | ORAL_TABLET | Freq: Every day | ORAL | Status: DC

## 2021-12-08 MED ORDER — GABAPENTIN 300 MG PO CAPS
600.0000 mg | ORAL_CAPSULE | Freq: Two times a day (BID) | ORAL | Status: DC
  Administered 2021-12-08 – 2021-12-11 (×8): 600 mg via ORAL
  Filled 2021-12-08 (×8): qty 2

## 2021-12-08 NOTE — Progress Notes (Signed)
RN called due to patient having shortness of breath despite HOB being elevated at 30.  At bedside, it was realized that patient had orthopnea which patient states that occur frequently at home and that he gets better once he sits up on the side of the bed.  He was in no acute distress at bedside.  Total time:  8 minutes This includes time reviewing the chart including progress notes, labs, EKGs, taking medical decisions, ordering labs and documenting findings.

## 2021-12-08 NOTE — Progress Notes (Signed)
MD notified that patient was having trouble breathing laying down. He stated he feels better and can breath better when he is sitting up on the side of the bed. He stated this happens to him at home frequently.

## 2021-12-08 NOTE — TOC Initial Note (Signed)
Transition of Care (TOC) - Initial/Assessment Note    Patient Details  Name: Edward Rocha. MRN: [id number] Date of Birth: 03-14-1966  Transition of Care [facility] CM/SW Contact:    Ihor Gully, LCSW Phone Number: 12/08/2021, 11:39 AM  Clinical Narrative:                 Patient from home with spouse and granddaughter. Admitted for acute on chronic CHF. Follows heart healthy diet, takes daily weights, drives, attends medical appointments and is independent.  Review of record indicates that in 2020 a 3n1 and RW was ordered via [employer] for patient.  TOC following for d/c needs.   Expected Discharge Plan: Home/Self Care Barriers to Discharge: Continued Medical Work up   Patient Goals and CMS Choice Patient states their goals for this hospitalization and ongoing recovery are:: return home      Expected Discharge Plan and Services Expected Discharge Plan: Home/Self Care       Living arrangements for the past 2 months: Single Family Home                                      Prior Living Arrangements/Services Living arrangements for the past 2 months: Single Family Home Lives with:: Spouse, Relatives Patient language and need for interpreter reviewed:: Yes Do you feel safe going back to the place where you live?: Yes      Need for Family Participation in Patient Care: Yes (Comment) Care giver support system in place?: Yes (comment)   Criminal Activity/Legal Involvement Pertinent to Current Situation/Hospitalization: No - Comment as needed  Activities of Daily Living Home Assistive Devices/Equipment: None ADL Screening (condition at time of admission) Patient's cognitive ability adequate to safely complete daily activities?: Yes Is the patient deaf or have difficulty hearing?: No Does the patient have difficulty seeing, even when wearing glasses/contacts?: No Does the patient have difficulty concentrating, remembering, or making decisions?: No Patient able to  express need for assistance with ADLs?: Yes Does the patient have difficulty dressing or bathing?: No Independently performs ADLs?: Yes (appropriate for developmental age) Does the patient have difficulty walking or climbing stairs?: No Weakness of Legs: None Weakness of Arms/Hands: None  Permission Sought/Granted                  Emotional Assessment     Affect (typically observed): Appropriate Orientation: : Oriented to Self, Oriented to Place, Oriented to  Time, Oriented to Situation Alcohol / Substance Use: Not Applicable Psych Involvement: No (comment)  Admission diagnosis:  SOB (shortness of breath) [R06.02] Acute on chronic systolic CHF (congestive heart failure) (HCC) [I50.23] Acute on chronic congestive heart failure, unspecified heart failure type ([place]) [I50.9] Patient Active Problem List   Diagnosis Date Noted   Acute on chronic systolic CHF (congestive heart failure) ([place]) 12/07/2021   Mixed hyperlipidemia 12/07/2021   Stage 3a chronic kidney disease (CKD) ([place]) 12/07/2021   Angioedema 10/18/2019   Acute heart failure with preserved ejection fraction (HFpEF) ([place]) 10/18/2019   Cirrhosis of liver ([place]) 10/18/2019   Acute exacerbation of CHF (congestive heart failure) ([place]) 10/18/2019   Altered mental status 09/21/2019   Pressure injury of skin 08/27/2019   Acute respiratory failure (HCC)    Aspiration into airway    Acute encephalopathy    Gastrointestinal hemorrhage    Endotracheal tube present    Nasogastric tube present    PAD (peripheral  artery disease) ([place]) 08/11/2019   Thrombocytopenia ([place]) 05/29/2019   Diabetes ([place]) 05/29/2019   Critical lower limb ischemia ([place]) 04/22/2019   PCP:  Neale Burly, MD Pharmacy:   [facility] [place], [place] [address] [place] [address] Phone: [phone] Fax: [phone]     Social Determinants of Health (SDOH)  Interventions    Readmission Risk Interventions Readmission Risk Prevention Plan 12/08/2021 09/23/2019  Transportation Screening Complete Complete  PCP or Specialist Appt within 3-5 Days - Complete  HRI or [place] - Not Complete  HRI or Home Care Consult comments - doing OP PT  Social Work Consult for [place] Planning/Counseling - Complete  Palliative Care Screening - Not Applicable  Medication Review Press photographer) - Complete  Some recent data might be hidden

## 2021-12-08 NOTE — Assessment & Plan Note (Deleted)
Oxymetry this am is 93% on room air.

## 2021-12-08 NOTE — Progress Notes (Addendum)
PROGRESS NOTE  Edward Rocha. JSE:[id number] DOB: 12-27-1965 DOA: 12/07/2021 PCP: Neale Burly, MD  Brief History:  56 year old male with a history of diabetes mellitus type 2, NICM, peripheral arterial disease, COPD, alcohol abuse in remission presenting with 1 week history of shortness of breath that significantly worsened over the past 4 days.  The patient states that he has had increasing orthopnea, increasing lower extremity edema, and increasing abdominal girth.  He endorses compliance with his furosemide 80 mg daily.  Interestingly, the patient has not followed up with cardiology, Dr. Rozann Lesches since 07/22/2021.  The patient is unclear where he is actually getting his refills for his furosemide, but nevertheless states that he has been compliant with his furosemide.  Also, he endorses compliance with some other cardiac med the name of which he does not recall, but again does not know who has been refilling his medication.  However, the patient states that he is thirsty and drinks "plenty of fluid".  He states that he drinks at least three 20 ounces of water bottles in addition to the fluid during the period of the day.  He denies any fevers, chills, headache, chest pain, abdominal pain, dysuria, hematuria, hematochezia, melena.  He did have 1 episode of nausea and vomiting on 12/06/2021, but has not had any emesis since then.  He complains of a largely nonproductive cough but occasionally does bring up some yellow sputum.  He denies any hemoptysis. Notably, the patient was recently admitted to [facility] from 10/15/2021 to 10/21/2021 when he was treated for decompensated HFrEF.  He was diuresed with IV furosemide and metolazone.  Repeat echocardiogram (TTE) obtained during that hospital admission showed an EF of 15 to 20% with mild to moderate dilated RV and RV systolic function reduced.  He was also treated for influenza A and COPD exacerbation.  He was discharged home  with furosemide 80 mg twice daily, then down to 80 mg once daily.  During that hospitalization, he was started on carvedilol.  His losartan and metoprolol succinate were discontinued.  He was -15 L after diuresis during the hospitalization.  Serum creatinine was 1.66 at the time of discharge. ED In the ED, the patient was afebrile hemodynamically stable with oxygen saturation 90% on room air.  BMP showed sodium 128, potassium 4.5, serum creatinine 1.52.  WBC 4.1, hemoglobin 12.9, platelets 132,000.  Chest x-Oleksy showed cardiomegaly with pulmonary vascular congestion.  EKG shows sinus tachycardia with nonspecific ST changes.  BNP was 1224.  The patient was given furosemide 80 mg IV x1.  He was admitted for further evaluation and treatment of his CHF.  He was continued on lasix 80 mg IV bid.  Cardiology was consulted to assist with management.     Assessment and Plan: * Acute on chronic systolic CHF (congestive heart failure) ([place])- (present on admission) Continue furosemide 80 mg IV twice daily Remains clinically fluid overloaded 2/9--give dose of metolazone 2.5 mg daily Cardiology consult Apparently his Delene Loll was discontinued during his last hospital admission at [facility] Carvedilol was started and lieu of losartan and metoprolol succinate 10/15/2021 echo EF 15 to 20%, moderately reduced RV, moderate TR, mild pulmonary hypertension Accurate I's and O's--NEG 1.6L Daily weights  Acute respiratory failure with hypoxia ([place])- (present on admission) Due to pulmonary edema in setting of underlying COPD Stable on 2.5L Wean oxygen for saturation >90%  Stage 3a chronic kidney disease (CKD) ([place]) Baseline  creatinine 1.5-1.7 -Monitor with diuresis  Hyponatremia Due to CHF/fluid overload Slowly improving with diuresis  Mixed hyperlipidemia Continue statin  PAD (peripheral artery disease) (HCC)- (present on admission) No claudication symptoms presently Continue  aspirin  Thrombocytopenia ([place])- (present on admission) Secondary to chronic hepatic congestion and alcohol use Usual range 120-130 Monitor for signs of bleeding         Status is: Inpatient Remains inpatient appropriate because: remains clinically fluid overloaded requiring IV lasix            Family Communication:   no Family at bedside  Consultants:  cardiology  Code Status:  FULL  DVT Prophylaxis:  [place] Heparin / [place] Lovenox   Procedures: As Listed in Progress Note Above  Antibiotics: None       Subjective: Had problems with orthopnea last night.  States breathing/sob not much better.  Denies cp, n/v/d, abd pain, f/c  Objective: Vitals:   12/08/21 0130 12/08/21 0204 12/08/21 0439 12/08/21 0500  BP:  (!) 149/90 (!) 135/97   Pulse: 89 81 87   Resp: (!) 30 17 18    Temp:  98.4 F (36.9 C) 98.2 F (36.8 C)   TempSrc:      SpO2: 99% 99% 100%   Weight:    99.3 kg  Height:        Intake/Output Summary (Last 24 hours) at 12/08/2021 1200 Last data filed at 12/08/2021 1047 Gross per 24 hour  Intake 960 ml  Output 1950 ml  Net -990 ml   Weight change:  Exam:  General:  Pt is alert, follows commands appropriately, not in acute distress HEENT: No icterus, No thrush, No neck mass, [place]/AT Cardiovascular: RRR, S1/S2, no rubs, no gallops Respiratory: bilateral crackles. No wheeze Abdomen: Soft/+BS, non tender, non distended, no guarding Extremities: 2+ LE edema, No lymphangitis, No petechiae, No rashes, no synovitis   Data Reviewed: I have personally reviewed following labs and imaging studies Basic Metabolic Panel: Recent Labs  Lab 12/07/21 0901 12/08/21 0428  NA 128* 129*  K 4.5 4.1  CL 97* 100  CO2 23 21*  GLUCOSE 111* 107*  BUN 31* 35*  CREATININE 1.52* 1.55*  CALCIUM 8.8* 8.6*  MG  --  2.0   Liver Function Tests: No results for input(s): AST, ALT, ALKPHOS, BILITOT, PROT, ALBUMIN in the last 168 hours. No results for input(s):  LIPASE, AMYLASE in the last 168 hours. No results for input(s): AMMONIA in the last 168 hours. Coagulation Profile: Recent Labs  Lab 12/07/21 0901  INR 1.3*   CBC: Recent Labs  Lab 12/07/21 0901  WBC 4.1  NEUTROABS 2.1  HGB 12.9*  HCT 39.5  MCV 83.0  PLT 132*   Cardiac Enzymes: No results for input(s): CKTOTAL, CKMB, CKMBINDEX, TROPONINI in the last 168 hours. BNP: Invalid input(s): POCBNP CBG: No results for input(s): GLUCAP in the last 168 hours. HbA1C: Recent Labs    12/07/21 0907  HGBA1C 6.9*   Urine analysis:    Component Value Date/Time   COLORURINE AMBER (A) 08/13/2019 1309   APPEARANCEUR CLOUDY (A) 08/13/2019 1309   LABSPEC 1.014 08/13/2019 1309   PHURINE 5.0 08/13/2019 1309   GLUCOSEU >=500 (A) 08/13/2019 1309   HGBUR LARGE (A) 08/13/2019 1309   BILIRUBINUR NEGATIVE 08/13/2019 1309   KETONESUR NEGATIVE 08/13/2019 1309   PROTEINUR 100 (A) 08/13/2019 1309   NITRITE NEGATIVE 08/13/2019 1309   LEUKOCYTESUR NEGATIVE 08/13/2019 1309   Sepsis Labs: @LABRCNTIP (procalcitonin:4,lacticidven:4) ) Recent Results (from the past 240 hour(s))  Resp  Panel by RT-PCR (Flu A&B, Covid) Nasopharyngeal Swab     Status: None   Collection Time: 12/07/21  9:08 AM   Specimen: Nasopharyngeal Swab; Nasopharyngeal(NP) swabs in vial transport medium  Result Value Ref Range Status   SARS Coronavirus 2 by RT PCR NEGATIVE NEGATIVE Final    Comment: (NOTE) SARS-CoV-2 target nucleic acids are NOT DETECTED.  The SARS-CoV-2 RNA is generally detectable in upper respiratory specimens during the acute phase of infection. The lowest concentration of SARS-CoV-2 viral copies this assay can detect is 138 copies/mL. A negative result does not preclude SARS-Cov-2 infection and should not be used as the sole basis for treatment or other patient management decisions. A negative result may occur with  improper specimen collection/handling, submission of specimen other than nasopharyngeal  swab, presence of viral mutation(s) within the areas targeted by this assay, and inadequate number of viral copies(<138 copies/mL). A negative result must be combined with clinical observations, patient history, and epidemiological information. The expected result is Negative.  Fact Sheet for Patients:  EntrepreneurPulse.com.au  Fact Sheet for Healthcare Providers:  IncredibleEmployment.be  This test is no t yet approved or cleared by the [place] FDA and  has been authorized for detection and/or diagnosis of SARS-CoV-2 by FDA under an Emergency Use Authorization (EUA). This EUA will remain  in effect (meaning this test can be used) for the duration of the COVID-19 declaration under Section 564(b)(1) of the Act, 21 U.S.C.section 360bbb-3(b)(1), unless the authorization is terminated  or revoked sooner.       Influenza A by PCR NEGATIVE NEGATIVE Final   Influenza B by PCR NEGATIVE NEGATIVE Final    Comment: (NOTE) The Xpert Xpress SARS-CoV-2/FLU/RSV plus assay is intended as an aid in the diagnosis of influenza from Nasopharyngeal swab specimens and should not be used as a sole basis for treatment. Nasal washings and aspirates are unacceptable for Xpert Xpress SARS-CoV-2/FLU/RSV testing.  Fact Sheet for Patients: EntrepreneurPulse.com.au  Fact Sheet for Healthcare Providers: IncredibleEmployment.be  This test is not yet approved or cleared by the [place] FDA and has been authorized for detection and/or diagnosis of SARS-CoV-2 by FDA under an Emergency Use Authorization (EUA). This EUA will remain in effect (meaning this test can be used) for the duration of the COVID-19 declaration under Section 564(b)(1) of the Act, 21 U.S.C. section 360bbb-3(b)(1), unless the authorization is terminated or revoked.  Performed at [facility], [address], [place], [place] [address]      Scheduled  Meds:  aspirin EC  81 mg Oral Daily   atorvastatin  40 mg Oral QHS   carvedilol  6.25 mg Oral BID WC   enoxaparin (LOVENOX) injection  40 mg Subcutaneous Daily   furosemide  80 mg Intravenous BID   gabapentin  600 mg Oral BID   metolazone  2.5 mg Oral Once   sodium chloride flush  3 mL Intravenous Q12H   spironolactone  12.5 mg Oral Daily   Continuous Infusions:  sodium chloride      Procedures/Studies: DG Chest 2 View  Result Date: 11/10/2021 CLINICAL DATA:  Shortness of breath and cough.  Yellow phlegm. EXAM: CHEST - 2 VIEW COMPARISON:  Chest radiograph 10/14/2021, CT 10/08/2021 FINDINGS: Similar cardiomegaly. Unchanged mediastinal contours. There are small bilateral pleural effusions. Fluid in the fissures. Diffuse peribronchial cuffing, increased from prior exam, with mild septal thickening. No confluent consolidation. No pneumothorax. Remote right upper posterior rib fractures. IMPRESSION: Findings suggesting CHF with cardiomegaly, small bilateral pleural effusions, and pulmonary  edema. No focal airspace disease. Electronically Signed   By: Keith Rake M.D.   On: 11/10/2021 18:33   DG Chest Port 1 View  Result Date: 12/07/2021 CLINICAL DATA:  Shortness of breath, fluid retention EXAM: PORTABLE CHEST 1 VIEW COMPARISON:  11/10/2021 FINDINGS: Moderate cardiomegaly, stable from prior. Aortic atherosclerosis. Mild pulmonary vascular congestion with slight bilateral perihilar interstitial prominence. Small right pleural effusion. No pneumothorax. IMPRESSION: Cardiomegaly with pulmonary vascular congestion and small right pleural effusion. Electronically Signed   By: Davina Poke D.O.   On: 12/07/2021 09:41    Orson Eva, DO  [facility]  If 7PM-7AM, please contact night-coverage [email or web address] Password [email or web address] 12/08/2021, 12:00 PM   LOS: 1 day

## 2021-12-08 NOTE — Assessment & Plan Note (Addendum)
Due to CHF/fluid overload -Improving, also got tolvaptan earlier this admission, now off

## 2021-12-09 DIAGNOSIS — K7469 Other cirrhosis of liver: Secondary | ICD-10-CM

## 2021-12-09 LAB — CBC
HCT: 34.6 % — ABNORMAL LOW (ref 39.0–52.0)
Hemoglobin: 11 g/dL — ABNORMAL LOW (ref 13.0–17.0)
MCH: 25.5 pg — ABNORMAL LOW (ref 26.0–34.0)
MCHC: 31.8 g/dL (ref 30.0–36.0)
MCV: 80.3 fL (ref 80.0–100.0)
Platelets: 121 10*3/uL — ABNORMAL LOW (ref 150–400)
RBC: 4.31 MIL/uL (ref 4.22–5.81)
RDW: 15.9 % — ABNORMAL HIGH (ref 11.5–15.5)
WBC: 3.7 10*3/uL — ABNORMAL LOW (ref 4.0–10.5)
nRBC: 0 % (ref 0.0–0.2)

## 2021-12-09 LAB — BASIC METABOLIC PANEL
Anion gap: 6 (ref 5–15)
BUN: 37 mg/dL — ABNORMAL HIGH (ref 6–20)
CO2: 22 mmol/L (ref 22–32)
Calcium: 8.6 mg/dL — ABNORMAL LOW (ref 8.9–10.3)
Chloride: 99 mmol/L (ref 98–111)
Creatinine, Ser: 1.53 mg/dL — ABNORMAL HIGH (ref 0.61–1.24)
GFR, Estimated: 53 mL/min — ABNORMAL LOW (ref >60)
Glucose, Bld: 111 mg/dL — ABNORMAL HIGH (ref 70–99)
Potassium: 4 mmol/L (ref 3.5–5.1)
Sodium: 127 mmol/L — ABNORMAL LOW (ref 135–145)

## 2021-12-09 LAB — MAGNESIUM: Magnesium: 2 mg/dL (ref 1.7–2.4)

## 2021-12-09 MED ORDER — SACUBITRIL-VALSARTAN 24-26 MG PO TABS
1.0000 | ORAL_TABLET | Freq: Two times a day (BID) | ORAL | Status: DC
  Administered 2021-12-09 – 2021-12-10 (×3): 1 via ORAL
  Filled 2021-12-09 (×3): qty 1

## 2021-12-09 NOTE — Consult Note (Signed)
Cardiology Consultation:   Patient ID: Edward Rocha. MRN: [id number]; DOB: [birth year]  Admit date: 12/07/2021 Date of Consult: 12/09/2021  PCP:  Neale Burly, MD   [facility] Providers Cardiologist:  Rozann Lesches, MD        Patient Profile:   Edward Rocha. is a 56 y.o. male with a hx of systolic heart failure who is being seen 12/09/2021 for the evaluation of acute systolic heart failure at the request of Dr. Carles Collet.  History of Present Illness:   [name] is a 56 year old gentleman with multiple hospitalizations/ED visits with EF of 20 to 25%, cardiac catheterization September 7 mild nonobstructive disease and nonischemic cardiomyopathy, chronic kidney disease stage IIIb here with acute systolic heart failure.  [facility] both [facility].  At 1 point on Entresto however this was discontinued likely secondary to acute kidney injury.  Continues with low-dose spironolactone 12.5 low-dose metoprolol and currently receiving IV Lasix for diuresis.  Feeling better less short of breath.  He does state that he had a few episodes last night where he felt some shortness of breath while laying flat.   Past Medical History:  Diagnosis Date   Acute on chronic renal failure (HCC)    Essential hypertension    GERD (gastroesophageal reflux disease)    Hyperosmolar syndrome 2020   Hypothyroidism    Mild coronary artery disease    Cardiac catheterization September 2022 - [facility]   Nonischemic cardiomyopathy [facility])    PAD (peripheral artery disease) (HCC)    Thrombocytopenia ([place])    Type 2 diabetes mellitus ([place])     Past Surgical History:  Procedure Laterality Date   ABDOMINAL AORTOGRAM W/LOWER EXTREMITY N/A 02/06/2019   Procedure: ABDOMINAL AORTOGRAM W/LOWER EXTREMITY;  Surgeon: [name], MD;  Location: [place] [facility];  Service: Cardiovascular;  Laterality: N/A;  bilateral   ABDOMINAL AORTOGRAM W/LOWER EXTREMITY N/A 04/30/2019   Procedure: ABDOMINAL  AORTOGRAM W/LOWER EXTREMITY;  Surgeon: [name], MD;  Location: [place] [facility];  Service: Cardiovascular;  Laterality: N/A;   AORTOGRAM  08/11/2019   Procedure: Aortogram;  Surgeon: [name], MD;  Location: [place];  Service: Vascular;;   ESOPHAGEAL BANDING  08/22/2019   Procedure: ESOPHAGEAL BANDING;  Surgeon: Milus Banister, MD;  Location: [facility] ENDOSCOPY;  Service: Endoscopy;;   ESOPHAGOGASTRODUODENOSCOPY N/A 08/22/2019   Procedure: ESOPHAGOGASTRODUODENOSCOPY (EGD);  Surgeon: Milus Banister, MD;  Location: [facility] ENDOSCOPY;  Service: Endoscopy;  Laterality: N/A;   FEMORAL-POPLITEAL BYPASS GRAFT Left 08/11/2019   Procedure: BYPASS LEFT FEMORAL-DISTAL POPLITEAL ARTERY USING PROPATEN GRAFT;  Surgeon: [name], MD;  Location: [place];  Service: Vascular;  Laterality: Left;   INSERTION OF DIALYSIS CATHETER Right 09/09/2019   Procedure: INSERTION OF DIALYSIS CATHETER;  Surgeon: Waynetta Sandy, MD;  Location: [place];  Service: Vascular;  Laterality: Right;   MULTIPLE TOOTH EXTRACTIONS     PERIPHERAL VASCULAR INTERVENTION  02/06/2019   Procedure: PERIPHERAL VASCULAR INTERVENTION;  Surgeon: [name], MD;  Location: [place] [facility];  Service: Cardiovascular;;  Bilateral Iliacs   PERIPHERAL VASCULAR INTERVENTION  04/30/2019   Procedure: PERIPHERAL VASCULAR INTERVENTION;  Surgeon: [name], MD;  Location: [place] [facility];  Service: Cardiovascular;;  Stent - Lt. Iliac    REMOVAL OF A DIALYSIS CATHETER Right 09/09/2019   Procedure: Removal Of A Dialysis Catheter;  Surgeon: Waynetta Sandy, MD;  Location: [place];  Service: Vascular;  Laterality: Right;  ULTRASOUND GUIDANCE FOR VASCULAR ACCESS Right 09/09/2019   Procedure: Ultrasound Guidance For Vascular Access;  Surgeon: Waynetta Sandy, MD;  Location: [place];  Service: Vascular;  Laterality: Right;   VASCULAR SURGERY       Home Medications:  Prior to Admission  medications   Medication Sig Start Date End Date Taking? Authorizing Provider  acetaminophen (TYLENOL) 500 MG tablet Take 1,000 mg by mouth daily as needed for moderate pain or headache.   Yes [provider]  albuterol (VENTOLIN HFA) 108 (90 Base) MCG/ACT inhaler Inhale 1-2 puffs into the lungs every 6 (six) hours as needed for wheezing or shortness of breath.   Yes [provider]  aspirin EC 81 MG tablet Take 81 mg by mouth daily. Swallow whole.   Yes [provider]  atorvastatin (LIPITOR) 40 MG tablet Take 40 mg by mouth at bedtime.   Yes [provider]  furosemide (LASIX) 40 MG tablet Take 1 tablet (40 mg total) by mouth 2 (two) times daily for 7 days. 11/10/21 12/07/21 Yes Noemi Chapel, MD  gabapentin (NEURONTIN) 300 MG capsule Take 600 mg by mouth 2 (two) times daily. 09/26/21  Yes [provider]  nitroGLYCERIN (NITROSTAT) 0.4 MG SL tablet Place 0.4 mg under the tongue every 5 (five) minutes as needed for chest pain.   Yes [provider]  spironolactone (ALDACTONE) 25 MG tablet Take 0.5 tablets (12.5 mg total) by mouth daily. 07/22/21  Yes Satira Sark, MD  sacubitril-valsartan (ENTRESTO) 24-26 MG Take 1 tablet by mouth 2 (two) times daily. Patient not taking: Reported on 12/07/2021 07/22/21   Satira Sark, MD    Inpatient Medications: Scheduled Meds:  aspirin EC  81 mg Oral Daily   atorvastatin  40 mg Oral QHS   carvedilol  6.25 mg Oral BID WC   enoxaparin (LOVENOX) injection  40 mg Subcutaneous Daily   furosemide  80 mg Intravenous BID   gabapentin  600 mg Oral BID   melatonin  6 mg Oral QHS   sodium chloride flush  3 mL Intravenous Q12H   spironolactone  12.5 mg Oral Daily   Continuous Infusions:  sodium chloride     PRN Meds: sodium chloride, acetaminophen, ondansetron (ZOFRAN) IV, sodium chloride flush  Allergies:    Allergies  Allergen Reactions   Other     Pt received platelets and had a bad reaction  from infusion     Social History:   Social History   Socioeconomic History   Marital status: Married    Spouse name: Not on file   Number of children: 2   Years of education: Not on file   Highest education level: Not on file  Occupational History   Not on file  Tobacco Use   Smoking status: Former    Packs/day: 1.00    Years: 36.00    Pack years: 36.00    Types: Cigarettes    Quit date: 01/25/2019    Years since quitting: 2.8   Smokeless tobacco: Never  Vaping Use   Vaping Use: Never used  Substance and Sexual Activity   Alcohol use: Not Currently    Comment: per pt's wife, pt is an alcoholic but just recently stopped drinking in 04/2019   Drug use: Never   Sexual activity: Not on file  Other Topics Concern   Not on file  Social History Narrative   Not on file   Social Determinants of Health   Financial Resource Strain: Not on  file  Food Insecurity: Not on file  Transportation Needs: Not on file  Physical Activity: Not on file  Stress: Not on file  Social Connections: Not on file  Intimate Partner Violence: Not on file    Family History:    Family History  Problem Relation Age of Onset   Alcohol abuse Father    Cancer Mother    Alcohol abuse Brother    Bipolar disorder Son    Bipolar disorder Daughter      ROS:  Please see the history of present illness.   All other ROS reviewed and negative.     Physical Exam/Data:   Vitals:   12/08/21 1336 12/08/21 2015 12/09/21 0500 12/09/21 0557  BP: 106/78 (!) 112/96  107/82  Pulse: 79 84  82  Resp:  19  18  Temp: 97.7 F (36.5 C) 98.1 F (36.7 C)  97.7 F (36.5 C)  TempSrc:      SpO2: 100% 100%  100%  Weight:   91.1 kg   Height:        Intake/Output Summary (Last 24 hours) at 12/09/2021 0910 Last data filed at 12/09/2021 0800 Gross per 24 hour  Intake 960 ml  Output 2100 ml  Net -1140 ml   Last 3 Weights 12/09/2021 12/08/2021 12/07/2021  Weight (lbs) 200 lb 13.4 oz 201 lb 4.5 oz 201 lb  Weight (kg)  91.1 kg 91.3 kg 91.173 kg     Body mass index is 26.5 kg/m.  General:  Well nourished, well developed, in no acute distress HEENT: normal Neck: Midneck JVD Vascular: No carotid bruits; Distal pulses 2+ bilaterally Cardiac:  normal S1, S2; RRR; no murmur  Lungs:  clear to auscultation bilaterally, no wheezing, rhonchi or rales  Abd: soft, nontender, no hepatomegaly  Ext: 3+ lower extremity bilateral edema Musculoskeletal:  No deformities, BUE and BLE strength normal and equal Skin: warm and dry  Neuro:  CNs 2-12 intact, no focal abnormalities noted Psych:  Normal affect   EKG:  The EKG was personally reviewed and demonstrates: Sinus tachycardia 106 with nonspecific ST-T wave changes  Telemetry:  Telemetry was personally reviewed and demonstrates: Sinus rhythm/sinus tachycardia with no adverse arrhythmias  Relevant CV Studies: Echocardiogram 07/05/2021 ([facility]): Left Ventricle: Left ventricle size is normal.    Left Ventricle: There is moderate concentric hypertrophy.    Left Ventricle: Systolic function is severely abnormal. EF: 20-25%.    Left Ventricle: There is diffuse hypokinesis of the left ventricle.    Right Ventricle: Right ventricle is mildly dilated.    Right Ventricle: Systolic function is low normal. Normal tricuspid  annular plane systolic excursion (TAPSE) >1.7 cm.    Left [facility]: Left [facility]rium volume index is severely increased (>48  mL/m2).    Right [facility]: Right atrium is mildly to moderately dilated.    Mitral Valve: There is mild regurgitation.    Tricuspid Valve: There is mild to moderate regurgitation.    Pericardium: There is no pericardial effusion.   Cardiac catheterization 07/06/2021 ([facility]): Coronary Angiography  1. Left Main -normal  2. Left anterior descending artery -10% mid, 10% distal  3. Diagonals -normal  4. Left Circumflex -25% mid  5. Obtuse Marginals -normal  6. Right Coronary Artery -normal  7. Posterior Descending Artery -normal    Hemodynamics  1. Aortic Pressure -140/70  mmHg  2. Left Ventricular - 140/33 mmHg   CONCLUSIONS:  1. Successful transradial cardiac catheterization  2. Mild nonobstructive coronary artery disease  3. Left ventricular solid  pressure 33    Chest CTA 07/17/2021: IMPRESSION: 1. No evidence of significant pulmonary embolus. 2. Cardiac enlargement with moderate bilateral pleural effusions. 3. Patchy airspace disease in the lungs likely represents edema or possibly multifocal pneumonia. 4. Hepatic cirrhosis.  Mild splenic enlargement.  Cholelithiasis. 5. Aortic atherosclerosis.   Laboratory Data:  High Sensitivity Troponin:   Recent Labs  Lab 12/07/21 0901 12/07/21 1142  TROPONINIHS 21* 19*     Chemistry Recent Labs  Lab 12/07/21 0901 12/08/21 0428 12/09/21 0352  NA 128* 129* 127*  K 4.5 4.1 4.0  CL 97* 100 99  CO2 23 21* 22  GLUCOSE 111* 107* 111*  BUN 31* 35* 37*  CREATININE 1.52* 1.55* 1.53*  CALCIUM 8.8* 8.6* 8.6*  MG  --  2.0 2.0  GFRNONAA 53* 52* 53*  ANIONGAP 8 8 6     No results for input(s): PROT, ALBUMIN, AST, ALT, ALKPHOS, BILITOT in the last 168 hours. Lipids No results for input(s): CHOL, TRIG, HDL, LABVLDL, LDLCALC, CHOLHDL in the last 168 hours.  Hematology Recent Labs  Lab 12/07/21 0901 12/09/21 0352  WBC 4.1 3.7*  RBC 4.76 4.31  HGB 12.9* 11.0*  HCT 39.5 34.6*  MCV 83.0 80.3  MCH 27.1 25.5*  MCHC 32.7 31.8  RDW 16.5* 15.9*  PLT 132* 121*   Thyroid No results for input(s): TSH, FREET4 in the last 168 hours.  BNP Recent Labs  Lab 12/07/21 0902  BNP 1,324.0*    DDimer No results for input(s): DDIMER in the last 168 hours.   Radiology/Studies:  DG Chest Port 1 View  Result Date: 12/07/2021 CLINICAL DATA:  Shortness of breath, fluid retention EXAM: PORTABLE CHEST 1 VIEW COMPARISON:  11/10/2021 FINDINGS: Moderate cardiomegaly, stable from prior. Aortic atherosclerosis. Mild pulmonary vascular congestion with slight bilateral perihilar  interstitial prominence. Small right pleural effusion. No pneumothorax. IMPRESSION: Cardiomegaly with pulmonary vascular congestion and small right pleural effusion. Electronically Signed   By: Davina Poke D.O.   On: 12/07/2021 09:41     Assessment and Plan:    Acute on chronic systolic CHF (congestive heart failure) (HCC) Recurrent episodes of systolic heart failure with multiple hospitalizations.  High risk for readmission.  He did see Dr. Domenic Polite in the clinic previously.  He has been lost to follow-up since then.  [facility]  Agree with current use of IV Lasix 80 mg twice daily.  Currently on spironolactone 12.5 mg a day.  Will at 1.5.  Potassium 4-4.5.  I will go ahead and restart Entresto low-dose 24/26 twice a day.  Continue to monitor potassium and creatinine.  2 L out yesterday.  Continue with carvedilol 6.25 mg twice a day.  PAD (peripheral artery disease) ([place]) Prior iliac stent placed by Dr. Carlis Abbott with vascular surgery.  Stable.  Thrombocytopenia ([place]) Likely related to alcohol use.  Stage 3a chronic kidney disease (CKD) (HCC) Creatinine currently 1.5.  We will go ahead and resume his Entresto.  I think it is likely that his creatinine will increase slightly.  This makes sense.  Watch potassium as well.  Continue with IV Lasix.  Hyponatremia This is a result of his ongoing heart failure.  Cirrhosis of liver (HCC) Alcohol use.         For questions or updates, please contact [place] Please consult [email or web address] for contact info under    Signed, Candee Furbish, MD  12/09/2021 9:10 AM

## 2021-12-09 NOTE — Assessment & Plan Note (Signed)
Alcohol use.

## 2021-12-09 NOTE — Assessment & Plan Note (Signed)
Prior iliac stent placed by Dr. Carlis Abbott with vascular surgery.  Stable.

## 2021-12-09 NOTE — Progress Notes (Signed)
Patient has been stable with no complaints this shift.  Patient still has not slept, but has voiced no issues this shift. Vitals stable at this time.

## 2021-12-09 NOTE — Assessment & Plan Note (Signed)
Likely related to alcohol use 

## 2021-12-09 NOTE — Assessment & Plan Note (Signed)
Creatinine currently 1.5.  We will go ahead and resume his Entresto.  I think it is likely that his creatinine will increase slightly.  This makes sense.  Watch potassium as well.  Continue with IV Lasix.

## 2021-12-09 NOTE — Assessment & Plan Note (Signed)
Recurrent episodes of systolic heart failure with multiple hospitalizations.  High risk for readmission.  He did see Dr. Domenic Polite in the clinic previously.  He has been lost to follow-up since then.  [facility]  Agree with current use of IV Lasix 80 mg twice daily.  Currently on spironolactone 12.5 mg a day.  Will at 1.5.  Potassium 4-4.5.  I will go ahead and restart Entresto low-dose 24/26 twice a day.  Continue to monitor potassium and creatinine.  2 L out yesterday.  Continue with carvedilol 6.25 mg twice a day.

## 2021-12-09 NOTE — Progress Notes (Signed)
PROGRESS NOTE  Edward Rocha. PJK:[id number] DOB: 1966-05-14 DOA: 12/07/2021 PCP: Neale Burly, MD  Brief History:  56 year old male with a history of diabetes mellitus type 2, NICM, peripheral arterial disease, COPD, alcohol abuse in remission presenting with 1 week history of shortness of breath that significantly worsened over the past 4 days.  The patient states that he has had increasing orthopnea, increasing lower extremity edema, and increasing abdominal girth.  He endorses compliance with his furosemide 80 mg daily.  Interestingly, the patient has not followed up with cardiology, Dr. Rozann Lesches since 07/22/2021.  The patient is unclear where he is actually getting his refills for his furosemide, but nevertheless states that he has been compliant with his furosemide.  Also, he endorses compliance with some other cardiac med the name of which he does not recall, but again does not know who has been refilling his medication.  However, the patient states that he is thirsty and drinks "plenty of fluid".  He states that he drinks at least three 20 ounces of water bottles in addition to the fluid during the period of the day.  He denies any fevers, chills, headache, chest pain, abdominal pain, dysuria, hematuria, hematochezia, melena.  He did have 1 episode of nausea and vomiting on 12/06/2021, but has not had any emesis since then.  He complains of a largely nonproductive cough but occasionally does bring up some yellow sputum.  He denies any hemoptysis. Notably, the patient was recently admitted to [facility] from 10/15/2021 to 10/21/2021 when he was treated for decompensated HFrEF.  He was diuresed with IV furosemide and metolazone.  Repeat echocardiogram (TTE) obtained during that hospital admission showed an EF of 15 to 20% with mild to moderate dilated RV and RV systolic function reduced.  He was also treated for influenza A and COPD exacerbation.  He was discharged home  with furosemide 80 mg twice daily, then down to 80 mg once daily.  During that hospitalization, he was started on carvedilol.  His losartan and metoprolol succinate were discontinued.  He was -15 L after diuresis during the hospitalization.  Serum creatinine was 1.66 at the time of discharge. ED In the ED, the patient was afebrile hemodynamically stable with oxygen saturation 90% on room air.  BMP showed sodium 128, potassium 4.5, serum creatinine 1.52.  WBC 4.1, hemoglobin 12.9, platelets 132,000.  Chest x-Lister showed cardiomegaly with pulmonary vascular congestion.  EKG shows sinus tachycardia with nonspecific ST changes.  BNP was 1224.  The patient was given furosemide 80 mg IV x1.  He was admitted for further evaluation and treatment of his CHF.  He was continued on lasix 80 mg IV bid.  Cardiology was consulted to assist with management.     Assessment and Plan: * Acute on chronic systolic CHF (congestive heart failure) ([place])- (present on admission) Continue furosemide 80 mg IV twice daily Remains clinically fluid overloaded 2/9--give dose of metolazone 2.5 mg daily Apparently his Delene Loll was discontinued during his last hospital admission at [facility] Carvedilol was started and lieu of losartan and metoprolol succinate Cardiology consult appreciated>>restart Delene Loll and spironolactone 10/15/2021 echo EF 15 to 20%, moderately reduced RV, moderate TR, mild pulmonary hypertension Accurate I's and O's--NEG 3.6L in past 48 hrs Daily weights Hold coreg for soft BPs  Acute respiratory failure with hypoxia ([place])- (present on admission) Due to pulmonary edema in setting of underlying COPD Stable on 2.5L Wean  oxygen for saturation >90%  Stage 3a chronic kidney disease (CKD) (HCC) Baseline creatinine 1.5-1.7 -Monitor with diuresis  Hyponatremia Due to CHF/fluid overload Anticipate Slowly improving with diuresis  Mixed hyperlipidemia Continue statin  PAD (peripheral artery  disease) ([place])- (present on admission) No claudication symptoms presently Continue aspirin  Thrombocytopenia ([place])- (present on admission) Secondary to chronic hepatic congestion and alcohol use Usual range 120-130 Monitor for signs of bleeding   Status is: Inpatient Remains inpatient appropriate because: remains clinically fluid overloaded requiring IV lasix                       Family Communication:   no Family at bedside   Consultants:  cardiology   Code Status:  FULL   DVT Prophylaxis:  [place] Lovenox     Procedures: As Listed in Progress Note Above   Antibiotics: None          Subjective: Patient is breathing better, but still has orthopnea.  Denies f/c, cp, sob, n/v/d, abd pain  Objective: Vitals:   12/08/21 2015 12/09/21 0500 12/09/21 0557 12/09/21 1339  BP: (!) 112/96  107/82 90/65  Pulse: 84  82 69  Resp: 19  18 16   Temp: 98.1 F (36.7 C)  97.7 F (36.5 C) (!) 97.3 F (36.3 C)  TempSrc:    Axillary  SpO2: 100%  100% 100%  Weight:  91.1 kg    Height:        Intake/Output Summary (Last 24 hours) at 12/09/2021 1638 Last data filed at 12/09/2021 1227 Gross per 24 hour  Intake 1200 ml  Output 1600 ml  Net -400 ml   Weight change: -0.073 kg Exam:  General:  Pt is alert, follows commands appropriately, not in acute distress HEENT: No icterus, No thrush, No neck mass, [place]/AT Cardiovascular: RRR, S1/S2, no rubs, no gallops Respiratory: bibasilar crackles. No wheeze Abdomen: Soft/+BS, non tender, non distended, no guarding Extremities: 2+LE  edema, No lymphangitis, No petechiae, No rashes, no synovitis   Data Reviewed: I have personally reviewed following labs and imaging studies Basic Metabolic Panel: Recent Labs  Lab 12/07/21 0901 12/08/21 0428 12/09/21 0352  NA 128* 129* 127*  K 4.5 4.1 4.0  CL 97* 100 99  CO2 23 21* 22  GLUCOSE 111* 107* 111*  BUN 31* 35* 37*  CREATININE 1.52* 1.55* 1.53*  CALCIUM 8.8* 8.6* 8.6*  MG  --   2.0 2.0   Liver Function Tests: No results for input(s): AST, ALT, ALKPHOS, BILITOT, PROT, ALBUMIN in the last 168 hours. No results for input(s): LIPASE, AMYLASE in the last 168 hours. No results for input(s): AMMONIA in the last 168 hours. Coagulation Profile: Recent Labs  Lab 12/07/21 0901  INR 1.3*   CBC: Recent Labs  Lab 12/07/21 0901 12/09/21 0352  WBC 4.1 3.7*  NEUTROABS 2.1  --   HGB 12.9* 11.0*  HCT 39.5 34.6*  MCV 83.0 80.3  PLT 132* 121*   Cardiac Enzymes: No results for input(s): CKTOTAL, CKMB, CKMBINDEX, TROPONINI in the last 168 hours. BNP: Invalid input(s): POCBNP CBG: No results for input(s): GLUCAP in the last 168 hours. HbA1C: Recent Labs    12/07/21 0907  HGBA1C 6.9*   Urine analysis:    Component Value Date/Time   COLORURINE AMBER (A) 08/13/2019 1309   APPEARANCEUR CLOUDY (A) 08/13/2019 1309   LABSPEC 1.014 08/13/2019 1309   PHURINE 5.0 08/13/2019 1309   GLUCOSEU >=500 (A) 08/13/2019 1309   HGBUR LARGE (A) 08/13/2019 1309  BILIRUBINUR NEGATIVE 08/13/2019 1309   KETONESUR NEGATIVE 08/13/2019 1309   PROTEINUR 100 (A) 08/13/2019 1309   NITRITE NEGATIVE 08/13/2019 1309   LEUKOCYTESUR NEGATIVE 08/13/2019 1309   Sepsis Labs: @LABRCNTIP (procalcitonin:4,lacticidven:4) ) Recent Results (from the past 240 hour(s))  Resp Panel by RT-PCR (Flu A&B, Covid) Nasopharyngeal Swab     Status: None   Collection Time: 12/07/21  9:08 AM   Specimen: Nasopharyngeal Swab; Nasopharyngeal(NP) swabs in vial transport medium  Result Value Ref Range Status   SARS Coronavirus 2 by RT PCR NEGATIVE NEGATIVE Final    Comment: (NOTE) SARS-CoV-2 target nucleic acids are NOT DETECTED.  The SARS-CoV-2 RNA is generally detectable in upper respiratory specimens during the acute phase of infection. The lowest concentration of SARS-CoV-2 viral copies this assay can detect is 138 copies/mL. A negative result does not preclude SARS-Cov-2 infection and should not be used as  the sole basis for treatment or other patient management decisions. A negative result may occur with  improper specimen collection/handling, submission of specimen other than nasopharyngeal swab, presence of viral mutation(s) within the areas targeted by this assay, and inadequate number of viral copies(<138 copies/mL). A negative result must be combined with clinical observations, patient history, and epidemiological information. The expected result is Negative.  Fact Sheet for Patients:  EntrepreneurPulse.com.au  Fact Sheet for Healthcare Providers:  IncredibleEmployment.be  This test is no t yet approved or cleared by the [place] FDA and  has been authorized for detection and/or diagnosis of SARS-CoV-2 by FDA under an Emergency Use Authorization (EUA). This EUA will remain  in effect (meaning this test can be used) for the duration of the COVID-19 declaration under Section 564(b)(1) of the Act, 21 U.S.C.section 360bbb-3(b)(1), unless the authorization is terminated  or revoked sooner.       Influenza A by PCR NEGATIVE NEGATIVE Final   Influenza B by PCR NEGATIVE NEGATIVE Final    Comment: (NOTE) The Xpert Xpress SARS-CoV-2/FLU/RSV plus assay is intended as an aid in the diagnosis of influenza from Nasopharyngeal swab specimens and should not be used as a sole basis for treatment. Nasal washings and aspirates are unacceptable for Xpert Xpress SARS-CoV-2/FLU/RSV testing.  Fact Sheet for Patients: EntrepreneurPulse.com.au  Fact Sheet for Healthcare Providers: IncredibleEmployment.be  This test is not yet approved or cleared by the [place] FDA and has been authorized for detection and/or diagnosis of SARS-CoV-2 by FDA under an Emergency Use Authorization (EUA). This EUA will remain in effect (meaning this test can be used) for the duration of the COVID-19 declaration under Section 564(b)(1) of  the Act, 21 U.S.C. section 360bbb-3(b)(1), unless the authorization is terminated or revoked.  Performed at [facility], [address], [place],  [address]      Scheduled Meds:  aspirin EC  81 mg Oral Daily   atorvastatin  40 mg Oral QHS   enoxaparin (LOVENOX) injection  40 mg Subcutaneous Daily   furosemide  80 mg Intravenous BID   gabapentin  600 mg Oral BID   melatonin  6 mg Oral QHS   sacubitril-valsartan  1 tablet Oral BID   sodium chloride flush  3 mL Intravenous Q12H   spironolactone  12.5 mg Oral Daily   Continuous Infusions:  sodium chloride      Procedures/Studies: DG Chest 2 View  Result Date: 11/10/2021 CLINICAL DATA:  Shortness of breath and cough.  Yellow phlegm. EXAM: CHEST - 2 VIEW COMPARISON:  Chest radiograph 10/14/2021, CT 10/08/2021 FINDINGS: Similar cardiomegaly. Unchanged mediastinal contours. There  are small bilateral pleural effusions. Fluid in the fissures. Diffuse peribronchial cuffing, increased from prior exam, with mild septal thickening. No confluent consolidation. No pneumothorax. Remote right upper posterior rib fractures. IMPRESSION: Findings suggesting CHF with cardiomegaly, small bilateral pleural effusions, and pulmonary edema. No focal airspace disease. Electronically Signed   By: Keith Rake M.D.   On: 11/10/2021 18:33   DG Chest Port 1 View  Result Date: 12/07/2021 CLINICAL DATA:  Shortness of breath, fluid retention EXAM: PORTABLE CHEST 1 VIEW COMPARISON:  11/10/2021 FINDINGS: Moderate cardiomegaly, stable from prior. Aortic atherosclerosis. Mild pulmonary vascular congestion with slight bilateral perihilar interstitial prominence. Small right pleural effusion. No pneumothorax. IMPRESSION: Cardiomegaly with pulmonary vascular congestion and small right pleural effusion. Electronically Signed   By: Davina Poke D.O.   On: 12/07/2021 09:41    Orson Eva, DO  [facility]  If 7PM-7AM, please contact  night-coverage [email or web address] Password [facility] 12/09/2021, 4:38 PM   LOS: 2 days

## 2021-12-09 NOTE — Assessment & Plan Note (Signed)
This is a result of his ongoing heart failure.

## 2021-12-10 DIAGNOSIS — N1831 Chronic kidney disease, stage 3a: Secondary | ICD-10-CM | POA: Diagnosis present

## 2021-12-10 DIAGNOSIS — N179 Acute kidney failure, unspecified: Secondary | ICD-10-CM | POA: Diagnosis present

## 2021-12-10 LAB — BASIC METABOLIC PANEL
Anion gap: 10 (ref 5–15)
BUN: 43 mg/dL — ABNORMAL HIGH (ref 6–20)
CO2: 23 mmol/L (ref 22–32)
Calcium: 8.3 mg/dL — ABNORMAL LOW (ref 8.9–10.3)
Chloride: 94 mmol/L — ABNORMAL LOW (ref 98–111)
Creatinine, Ser: 2.03 mg/dL — ABNORMAL HIGH (ref 0.61–1.24)
GFR, Estimated: 38 mL/min — ABNORMAL LOW (ref >60)
Glucose, Bld: 81 mg/dL (ref 70–99)
Potassium: 3.9 mmol/L (ref 3.5–5.1)
Sodium: 127 mmol/L — ABNORMAL LOW (ref 135–145)

## 2021-12-10 LAB — CBC
HCT: 36 % — ABNORMAL LOW (ref 39.0–52.0)
Hemoglobin: 11.8 g/dL — ABNORMAL LOW (ref 13.0–17.0)
MCH: 26.9 pg (ref 26.0–34.0)
MCHC: 32.8 g/dL (ref 30.0–36.0)
MCV: 82 fL (ref 80.0–100.0)
Platelets: 129 10*3/uL — ABNORMAL LOW (ref 150–400)
RBC: 4.39 MIL/uL (ref 4.22–5.81)
RDW: 16 % — ABNORMAL HIGH (ref 11.5–15.5)
WBC: 4.2 10*3/uL (ref 4.0–10.5)
nRBC: 0 % (ref 0.0–0.2)

## 2021-12-10 LAB — MAGNESIUM: Magnesium: 1.9 mg/dL (ref 1.7–2.4)

## 2021-12-10 MED ORDER — SODIUM CHLORIDE 0.9 % IV BOLUS
250.0000 mL | Freq: Once | INTRAVENOUS | Status: AC
  Administered 2021-12-11: 250 mL via INTRAVENOUS

## 2021-12-10 NOTE — Assessment & Plan Note (Addendum)
Cardiorenal syndrome  Hyponatremia-hypervolemic, -Improved, off Lasix gtt. and tolvaptan now -Creatinine stable, torsemide dose increased

## 2021-12-10 NOTE — Progress Notes (Signed)
PROGRESS NOTE  Edward Rocha. ZWC:[id number] DOB: [birth year] DOA: 12/07/2021 PCP: Neale Burly, MD  Brief History:  56 year old male with a history of diabetes mellitus type 2, NICM, peripheral arterial disease, COPD, alcohol abuse in remission presenting with 1 week history of shortness of breath that significantly worsened over the past 4 days.  The patient states that he has had increasing orthopnea, increasing lower extremity edema, and increasing abdominal girth.  He endorses compliance with his furosemide 80 mg daily.  Interestingly, the patient has not followed up with cardiology, Dr. Rozann Lesches since 07/22/2021.  The patient is unclear where he is actually getting his refills for his furosemide, but nevertheless states that he has been compliant with his furosemide.  Also, he endorses compliance with some other cardiac med the name of which he does not recall, but again does not know who has been refilling his medication.  However, the patient states that he is thirsty and drinks "plenty of fluid".  He states that he drinks at least three 20 ounces of water bottles in addition to the fluid during the period of the day.  He denies any fevers, chills, headache, chest pain, abdominal pain, dysuria, hematuria, hematochezia, melena.  He did have 1 episode of nausea and vomiting on 12/06/2021, but has not had any emesis since then.  He complains of a largely nonproductive cough but occasionally does bring up some yellow sputum.  He denies any hemoptysis. Notably, the patient was recently admitted to [facility] from 10/15/2021 to 10/21/2021 when he was treated for decompensated HFrEF.  He was diuresed with IV furosemide and metolazone.  Repeat echocardiogram (TTE) obtained during that hospital admission showed an EF of 15 to 20% with mild to moderate dilated RV and RV systolic function reduced.  He was also treated for influenza A and COPD exacerbation.  He was discharged home  with furosemide 80 mg twice daily, then down to 80 mg once daily.  During that hospitalization, he was started on carvedilol.  His losartan and metoprolol succinate were discontinued.  He was -15 L after diuresis during the hospitalization.  Serum creatinine was 1.66 at the time of discharge. ED In the ED, the patient was afebrile hemodynamically stable with oxygen saturation 90% on room air.  BMP showed sodium 128, potassium 4.5, serum creatinine 1.52.  WBC 4.1, hemoglobin 12.9, platelets 132,000.  Chest x-[place] showed cardiomegaly with pulmonary vascular congestion.  EKG shows sinus tachycardia with nonspecific ST changes.  BNP was 1224.  The patient was given furosemide 80 mg IV x1.  He was admitted for further evaluation and treatment of his CHF.  He was continued on lasix 80 mg IV bid.  Cardiology was consulted to assist with management.    Assessment and Plan: * Acute on chronic systolic CHF (congestive heart failure) (HCC)- (present on admission) Continue furosemide 80 mg IV twice daily Remains clinically fluid overloaded 2/9--give dose of metolazone 2.5 mg daily Apparently his Delene Loll was discontinued during his last hospital admission at [facility]>>Carvedilol was started and lieu of losartan and metoprolol succinate  Cardiology consult appreciated>>restart Delene Loll and spironolactone 2/10 10/15/2021 echo EF 15 to 20%, moderately reduced RV, moderate TR, mild pulmonary hypertension Accurate I's and O's--NEG 5.1L in past 72 hrs Daily weights Hold coreg for soft BPs 12/10/21--hold entresto and spironolactone due to rise in serum creatinine -may need transfer [facility] for Advance CHF team eval  Acute respiratory failure  with hypoxia ([place])- (present on admission) Due to pulmonary edema in setting of underlying COPD Stable on 2.5L Wean oxygen for saturation >90%  Acute renal failure superimposed on stage 3a chronic kidney disease (HCC) Baseline creatinine 1.5-1.7 -Monitor with  diuresis Serum creatinine up to 2.03 -hold Entresto and spironolactone -continue lasix 80 IV bid -may need transfer to [facility] for Advance CHF team eval  Hyponatremia Due to CHF/fluid overload Anticipate Slowly improving with diuresis  Mixed hyperlipidemia Continue statin  PAD (peripheral artery disease) ([place])- (present on admission) No claudication symptoms presently Continue aspirin  Thrombocytopenia ([place])- (present on admission) Secondary to chronic hepatic congestion and alcohol use Usual range 120-130 Monitor for signs of bleeding   Status is: Inpatient Remains inpatient appropriate because: remains clinically fluid overloaded requiring IV lasix           Family Communication:   no Family at bedside   Consultants:  cardiology   Code Status:  FULL   DVT Prophylaxis:  [place] Lovenox     Procedures: As Listed in Progress Note Above   Antibiotics: None          Subjective: Patient states he is breathing better but still having some orthopnea.  Denies cp, cough, f/c, n/v/d  Objective: Vitals:   12/09/21 1339 12/09/21 2029 12/10/21 0537 12/10/21 1421  BP: 90/65 96/71 97/71  92/64  Pulse: 69 70 72 71  Resp: 16 18 18 18   Temp: (!) 97.3 F (36.3 C) 97.9 F (36.6 C) 97.8 F (36.6 C) 97.6 F (36.4 C)  TempSrc: Axillary     SpO2: 100% 100% 100% 100%  Weight:   102.3 kg   Height:        Intake/Output Summary (Last 24 hours) at 12/10/2021 1625 Last data filed at 12/10/2021 1213 Gross per 24 hour  Intake 1320 ml  Output 1200 ml  Net 120 ml   Weight change: 11.2 kg Exam:  General:  Pt is alert, follows commands appropriately, not in acute distress HEENT: No icterus, No thrush, No neck mass, [place]/AT Cardiovascular: RRR, S1/S2, no rubs, no gallops Respiratory: bibasilar rales. No wheeze Abdomen: Soft/+BS, non tender, non distended, no guarding Extremities: 2 + LE edema, No lymphangitis, No petechiae, No rashes, no synovitis   Data Reviewed: I  have personally reviewed following labs and imaging studies Basic Metabolic Panel: Recent Labs  Lab 12/07/21 0901 12/08/21 0428 12/09/21 0352 12/10/21 0145  NA 128* 129* 127* 127*  K 4.5 4.1 4.0 3.9  CL 97* 100 99 94*  CO2 23 21* 22 23  GLUCOSE 111* 107* 111* 81  BUN 31* 35* 37* 43*  CREATININE 1.52* 1.55* 1.53* 2.03*  CALCIUM 8.8* 8.6* 8.6* 8.3*  MG  --  2.0 2.0 1.9   Liver Function Tests: No results for input(s): AST, ALT, ALKPHOS, BILITOT, PROT, ALBUMIN in the last 168 hours. No results for input(s): LIPASE, AMYLASE in the last 168 hours. No results for input(s): AMMONIA in the last 168 hours. Coagulation Profile: Recent Labs  Lab 12/07/21 0901  INR 1.3*   CBC: Recent Labs  Lab 12/07/21 0901 12/09/21 0352 12/10/21 0145  WBC 4.1 3.7* 4.2  NEUTROABS 2.1  --   --   HGB 12.9* 11.0* 11.8*  HCT 39.5 34.6* 36.0*  MCV 83.0 80.3 82.0  PLT 132* 121* 129*   Cardiac Enzymes: No results for input(s): CKTOTAL, CKMB, CKMBINDEX, TROPONINI in the last 168 hours. BNP: Invalid input(s): POCBNP CBG: No results for input(s): GLUCAP in the last 168 hours.  HbA1C: No results for input(s): HGBA1C in the last 72 hours. Urine analysis:    Component Value Date/Time   COLORURINE AMBER (A) 08/13/2019 1309   APPEARANCEUR CLOUDY (A) 08/13/2019 1309   LABSPEC 1.014 08/13/2019 1309   PHURINE 5.0 08/13/2019 1309   GLUCOSEU >=500 (A) 08/13/2019 1309   HGBUR LARGE (A) 08/13/2019 1309   BILIRUBINUR NEGATIVE 08/13/2019 1309   KETONESUR NEGATIVE 08/13/2019 1309   PROTEINUR 100 (A) 08/13/2019 1309   NITRITE NEGATIVE 08/13/2019 1309   LEUKOCYTESUR NEGATIVE 08/13/2019 1309   Sepsis Labs: @LABRCNTIP (procalcitonin:4,lacticidven:4) ) Recent Results (from the past 240 hour(s))  Resp Panel by RT-PCR (Flu A&B, Covid) Nasopharyngeal Swab     Status: None   Collection Time: 12/07/21  9:08 AM   Specimen: Nasopharyngeal Swab; Nasopharyngeal(NP) swabs in vial transport medium  Result Value Ref  Range Status   SARS Coronavirus 2 by RT PCR NEGATIVE NEGATIVE Final    Comment: (NOTE) SARS-CoV-2 target nucleic acids are NOT DETECTED.  The SARS-CoV-2 RNA is generally detectable in upper respiratory specimens during the acute phase of infection. The lowest concentration of SARS-CoV-2 viral copies this assay can detect is 138 copies/mL. A negative result does not preclude SARS-Cov-2 infection and should not be used as the sole basis for treatment or other patient management decisions. A negative result may occur with  improper specimen collection/handling, submission of specimen other than nasopharyngeal swab, presence of viral mutation(s) within the areas targeted by this assay, and inadequate number of viral copies(<138 copies/mL). A negative result must be combined with clinical observations, patient history, and epidemiological information. The expected result is Negative.  Fact Sheet for Patients:  EntrepreneurPulse.com.au  Fact Sheet for Healthcare Providers:  IncredibleEmployment.be  This test is no t yet approved or cleared by the [place] FDA and  has been authorized for detection and/or diagnosis of SARS-CoV-2 by FDA under an Emergency Use Authorization (EUA). This EUA will remain  in effect (meaning this test can be used) for the duration of the COVID-19 declaration under Section 564(b)(1) of the Act, 21 U.S.C.section 360bbb-3(b)(1), unless the authorization is terminated  or revoked sooner.       Influenza A by PCR NEGATIVE NEGATIVE Final   Influenza B by PCR NEGATIVE NEGATIVE Final    Comment: (NOTE) The Xpert Xpress SARS-CoV-2/FLU/RSV plus assay is intended as an aid in the diagnosis of influenza from Nasopharyngeal swab specimens and should not be used as a sole basis for treatment. Nasal washings and aspirates are unacceptable for Xpert Xpress SARS-CoV-2/FLU/RSV testing.  Fact Sheet for  Patients: EntrepreneurPulse.com.au  Fact Sheet for Healthcare Providers: IncredibleEmployment.be  This test is not yet approved or cleared by the [place] FDA and has been authorized for detection and/or diagnosis of SARS-CoV-2 by FDA under an Emergency Use Authorization (EUA). This EUA will remain in effect (meaning this test can be used) for the duration of the COVID-19 declaration under Section 564(b)(1) of the Act, 21 U.S.C. section 360bbb-3(b)(1), unless the authorization is terminated or revoked.  Performed at [facility], [address], [place], [place] [address]      Scheduled Meds:  aspirin EC  81 mg Oral Daily   atorvastatin  40 mg Oral QHS   enoxaparin (LOVENOX) injection  40 mg Subcutaneous Daily   furosemide  80 mg Intravenous BID   gabapentin  600 mg Oral BID   melatonin  6 mg Oral QHS   sodium chloride flush  3 mL Intravenous Q12H   Continuous Infusions:  sodium  chloride      Procedures/Studies: DG Chest 2 View  Result Date: 11/10/2021 CLINICAL DATA:  Shortness of breath and cough.  Yellow phlegm. EXAM: CHEST - 2 VIEW COMPARISON:  Chest radiograph 10/14/2021, CT 10/08/2021 FINDINGS: Similar cardiomegaly. Unchanged mediastinal contours. There are small bilateral pleural effusions. Fluid in the fissures. Diffuse peribronchial cuffing, increased from prior exam, with mild septal thickening. No confluent consolidation. No pneumothorax. Remote right upper posterior rib fractures. IMPRESSION: Findings suggesting CHF with cardiomegaly, small bilateral pleural effusions, and pulmonary edema. No focal airspace disease. Electronically Signed   By: Keith Rake M.D.   On: 11/10/2021 18:33   DG Chest Port 1 View  Result Date: 12/07/2021 CLINICAL DATA:  Shortness of breath, fluid retention EXAM: PORTABLE CHEST 1 VIEW COMPARISON:  11/10/2021 FINDINGS: Moderate cardiomegaly, stable from prior. Aortic atherosclerosis. Mild pulmonary  vascular congestion with slight bilateral perihilar interstitial prominence. Small right pleural effusion. No pneumothorax. IMPRESSION: Cardiomegaly with pulmonary vascular congestion and small right pleural effusion. Electronically Signed   By: Davina Poke D.O.   On: 12/07/2021 09:41    Orson Eva, DO  [facility]  If 7PM-7AM, please contact night-coverage [email or web address] Password [email or web address] 12/10/2021, 4:25 PM   LOS: 3 days

## 2021-12-11 DIAGNOSIS — N179 Acute kidney failure, unspecified: Secondary | ICD-10-CM

## 2021-12-11 DIAGNOSIS — E871 Hypo-osmolality and hyponatremia: Secondary | ICD-10-CM

## 2021-12-11 DIAGNOSIS — N189 Chronic kidney disease, unspecified: Secondary | ICD-10-CM

## 2021-12-11 DIAGNOSIS — I5023 Acute on chronic systolic (congestive) heart failure: Secondary | ICD-10-CM

## 2021-12-11 LAB — BASIC METABOLIC PANEL
Anion gap: 9 (ref 5–15)
BUN: 49 mg/dL — ABNORMAL HIGH (ref 6–20)
CO2: 23 mmol/L (ref 22–32)
Calcium: 8 mg/dL — ABNORMAL LOW (ref 8.9–10.3)
Chloride: 90 mmol/L — ABNORMAL LOW (ref 98–111)
Creatinine, Ser: 2.81 mg/dL — ABNORMAL HIGH (ref 0.61–1.24)
GFR, Estimated: 25 mL/min — ABNORMAL LOW (ref >60)
Glucose, Bld: 112 mg/dL — ABNORMAL HIGH (ref 70–99)
Potassium: 4.8 mmol/L (ref 3.5–5.1)
Sodium: 122 mmol/L — ABNORMAL LOW (ref 135–145)

## 2021-12-11 LAB — MAGNESIUM: Magnesium: 2 mg/dL (ref 1.7–2.4)

## 2021-12-11 MED ORDER — FUROSEMIDE 10 MG/ML IJ SOLN
120.0000 mg | Freq: Once | INTRAVENOUS | Status: AC
  Administered 2021-12-12: 120 mg via INTRAVENOUS
  Filled 2021-12-11: qty 2

## 2021-12-11 MED ORDER — FUROSEMIDE 10 MG/ML IJ SOLN
20.0000 mg/h | INTRAVENOUS | Status: DC
  Administered 2021-12-12 – 2021-12-14 (×8): 20 mg/h via INTRAVENOUS
  Filled 2021-12-11 (×9): qty 20

## 2021-12-11 NOTE — Progress Notes (Signed)
Report has been given to [place].

## 2021-12-11 NOTE — Consult Note (Signed)
Cardiology Consultation:   Patient ID: Edward Rocha. MRN: [id number]; DOB: 03/12/1966  Admit date: 12/07/2021 Date of Consult: 12/11/2021  Primary Care Provider: Neale Burly, MD [facility] Cardiologist: Rozann Lesches, MD  [place] Electrophysiologist:  None   Patient Profile:   Edward Rocha. is a 56 y.o. male with NICM/HFrEF, nonobstructive CAD, HTN, hypothyroidism, PAD, DM2, prior etoh abuse, CKD3 and thrombocytopenia who is being seen today for the evaluation of HF at the request of Dr. Shanon Brow Tat ([facility]).  History of Present Illness:   Per chart review:  Mr. Minturn initially presented to [facility] ED on 12/07/21 with shortness of breath and fluid retention of one week duration that was worsening over the prior 4 days.  He had reported increasing orthopnea, lower extremity edema and abdominal distention.  He was taking furosemide 80 mg oral daily. He was consuming a significant amount of fluid of at least 60 ounces of water daily but denied any recent dietary changes.  VS on initial ED evaluation (12/07/21): P 100, BP 130/107, RR 20, T 97.84F,O2 100%/RA. He was noted to have tachycardia, bilateral JVD, decreased breath sounds and rales bilaterally along with significant lower extremity pitting edema.  CXR with pulmonary vascular congestion right pleural effusion, BNP elevated at 1324, sodium low at 128.  Weight was 91.2 kg (201 lb).  He thought that his dry weight is around 205 pounds leading up to admission but had been much lower in the past.  He does not regularly weigh himself at home.  He denied any recent chest pain, pressure or other anginal equivalents.  He was seen by Dr. Marlou Porch on 02/10 who agreed with continuation of Lasix 80 mg IV twice daily along with spironolactone 12.5 mg daily, carvedilol 6.25 mg twice daily and addition of Entresto 24/26 mg twice daily.  His Coreg was subsequently held for soft blood pressure and Entresto and spironolactone were held  on 02/11 with increasing sCr (1.5->2.0).  He had minimal documented urine output 02/11 and an increase in weight (102.3 kg->105 kg) with net positive 1100 (I 1450, O 350). He was given 250 cc IVF bolus (02/12 AM) with ongoing worsening AKI on CKD.  VS during my evaluation: P 89, BP 115/69 (84), RR 14, O2 100%/RA He reports taking all of his medications as not had any dietary changes.  He lives with his wife and granddaughter locally.  He unfortunately lost his grandson back in October to suicide which has been really hard on him recently.  He did not notice any specific event that triggered his current heart failure exacerbation but she has noticed gradually worsening lower extremity edema that initially was worse on his left leg where he had prior surgery but quickly transition to both legs down to his groin and abdomen.  He denied any significant worsening of orthopnea or PND but did have dyspnea on exertion and profound lower extremity edema.  Past Medical History:  Diagnosis Date   Acute on chronic renal failure (HCC)    Essential hypertension    GERD (gastroesophageal reflux disease)    Hyperosmolar syndrome 2020   Hypothyroidism    Mild coronary artery disease    Cardiac catheterization September 2022 - [facility]   Nonischemic cardiomyopathy [facility])    PAD (peripheral artery disease) (HCC)    Thrombocytopenia ([place])    Type 2 diabetes mellitus ([place])    Past Surgical History:  Procedure Laterality Date   ABDOMINAL AORTOGRAM W/LOWER EXTREMITY N/A 02/06/2019  Procedure: ABDOMINAL AORTOGRAM W/LOWER EXTREMITY;  Surgeon: [name], MD;  Location: [place] CV LAB;  Service: Cardiovascular;  Laterality: N/A;  bilateral   ABDOMINAL AORTOGRAM W/LOWER EXTREMITY N/A 04/30/2019   Procedure: ABDOMINAL AORTOGRAM W/LOWER EXTREMITY;  Surgeon: [name], MD;  Location: [place] CV LAB;  Service: Cardiovascular;  Laterality: N/A;   AORTOGRAM  08/11/2019   Procedure: Aortogram;   Surgeon: [name], MD;  Location: [place];  Service: Vascular;;   ESOPHAGEAL BANDING  08/22/2019   Procedure: ESOPHAGEAL BANDING;  Surgeon: Milus Banister, MD;  Location: [facility] ENDOSCOPY;  Service: Endoscopy;;   ESOPHAGOGASTRODUODENOSCOPY N/A 08/22/2019   Procedure: ESOPHAGOGASTRODUODENOSCOPY (EGD);  Surgeon: Milus Banister, MD;  Location: [facility] ENDOSCOPY;  Service: Endoscopy;  Laterality: N/A;   FEMORAL-POPLITEAL BYPASS GRAFT Left 08/11/2019   Procedure: BYPASS LEFT FEMORAL-DISTAL POPLITEAL ARTERY USING PROPATEN GRAFT;  Surgeon: [name], MD;  Location: [place];  Service: Vascular;  Laterality: Left;   INSERTION OF DIALYSIS CATHETER Right 09/09/2019   Procedure: INSERTION OF DIALYSIS CATHETER;  Surgeon: Waynetta Sandy, MD;  Location: [place];  Service: Vascular;  Laterality: Right;   MULTIPLE TOOTH EXTRACTIONS     PERIPHERAL VASCULAR INTERVENTION  02/06/2019   Procedure: PERIPHERAL VASCULAR INTERVENTION;  Surgeon: [name], MD;  Location: [place] CV LAB;  Service: Cardiovascular;;  Bilateral Iliacs   PERIPHERAL VASCULAR INTERVENTION  04/30/2019   Procedure: PERIPHERAL VASCULAR INTERVENTION;  Surgeon: [name], MD;  Location: [place] [facility];  Service: Cardiovascular;;  Stent - Lt. Iliac    REMOVAL OF A DIALYSIS CATHETER Right 09/09/2019   Procedure: Removal Of A Dialysis Catheter;  Surgeon: Waynetta Sandy, MD;  Location: [place];  Service: Vascular;  Laterality: Right;   ULTRASOUND GUIDANCE FOR VASCULAR ACCESS Right 09/09/2019   Procedure: Ultrasound Guidance For Vascular Access;  Surgeon: Waynetta Sandy, MD;  Location: [place];  Service: Vascular;  Laterality: Right;   VASCULAR SURGERY      Home Medications:  Prior to Admission medications   Medication Sig Start Date End Date Taking? Authorizing Provider  acetaminophen (TYLENOL) 500 MG tablet Take 1,000 mg by mouth daily as needed for moderate pain or headache.   Yes  [provider]  albuterol (VENTOLIN HFA) 108 (90 Base) MCG/ACT inhaler Inhale 1-2 puffs into the lungs every 6 (six) hours as needed for wheezing or shortness of breath.   Yes [provider]  aspirin EC 81 MG tablet Take 81 mg by mouth daily. Swallow whole.   Yes [provider]  atorvastatin (LIPITOR) 40 MG tablet Take 40 mg by mouth at bedtime.   Yes [provider]  furosemide (LASIX) 40 MG tablet Take 1 tablet (40 mg total) by mouth 2 (two) times daily for 7 days. 11/10/21 12/07/21 Yes Noemi Chapel, MD  gabapentin (NEURONTIN) 300 MG capsule Take 600 mg by mouth 2 (two) times daily. 09/26/21  Yes [provider]  nitroGLYCERIN (NITROSTAT) 0.4 MG SL tablet Place 0.4 mg under the tongue every 5 (five) minutes as needed for chest pain.   Yes [provider]  spironolactone (ALDACTONE) 25 MG tablet Take 0.5 tablets (12.5 mg total) by mouth daily. 07/22/21  Yes Satira Sark, MD  sacubitril-valsartan (ENTRESTO) 24-26 MG Take 1 tablet by mouth 2 (two) times daily. Patient not taking: Reported on 12/07/2021 07/22/21   Satira Sark, MD   Inpatient Medications: Scheduled Meds:  aspirin EC  81 mg Oral Daily  atorvastatin  40 mg Oral QHS   enoxaparin (LOVENOX) injection  40 mg Subcutaneous Daily   furosemide  80 mg Intravenous BID   gabapentin  600 mg Oral BID   melatonin  6 mg Oral QHS   sodium chloride flush  3 mL Intravenous Q12H   Continuous Infusions:  sodium chloride     PRN Meds: sodium chloride, acetaminophen, ondansetron (ZOFRAN) IV, sodium chloride flush  Allergies:    Allergies  Allergen Reactions   Other     Pt received platelets and had a bad reaction from infusion    Social History:   Social History   Socioeconomic History   Marital status: Married    Spouse name: Not on file   Number of children: 2   Years of education: Not on file   Highest education level: Not on file  Occupational History   Not on file   Tobacco Use   Smoking status: Former    Packs/day: 1.00    Years: 36.00    Pack years: 36.00    Types: Cigarettes    Quit date: 01/25/2019    Years since quitting: 2.8   Smokeless tobacco: Never  Vaping Use   Vaping Use: Never used  Substance and Sexual Activity   Alcohol use: Not Currently    Comment: per pt's wife, pt is an alcoholic but just recently stopped drinking in 04/2019   Drug use: Never   Sexual activity: Not on file  Other Topics Concern   Not on file  Social History Narrative   Not on file   Social Determinants of Health   Financial Resource Strain: Not on file  Food Insecurity: Not on file  Transportation Needs: Not on file  Physical Activity: Not on file  Stress: Not on file  Social Connections: Not on file  Intimate Partner Violence: Not on file    Family History:    Family History  Problem Relation Age of Onset   Alcohol abuse Father    Cancer Mother    Alcohol abuse Brother    Bipolar disorder Son    Bipolar disorder Daughter     ROS:  Review of Systems: [y] = yes, [ ]  = no      General: Weight gain [y]; Weight loss [ ] ; Anorexia [ ] ; Fatigue [y]; Fever [ ] ; Chills [ ] ; Weakness [ ]    Cardiac: Chest pain/pressure [ ] ; Resting SOB [y]; Exertional SOB [y]; Orthopnea [ ] ; Pedal Edema [y]; Palpitations [ ] ; Syncope [ ] ; Presyncope [ ] ; Paroxysmal nocturnal dyspnea [ ]    Pulmonary: Cough [ ] ; Wheezing [ ] ; Hemoptysis [ ] ; Sputum [ ] ; Snoring [ ]    GI: Vomiting [ ] ; Dysphagia [ ] ; Melena [ ] ; Hematochezia [ ] ; Heartburn [ ] ; Abdominal pain [ ] ; Constipation [ ] ; Diarrhea [ ] ; BRBPR [ ]    GU: Hematuria [ ] ; Dysuria [ ] ; Nocturia [ ]  Vascular: Pain in legs with walking [ ] ; Pain in feet with lying flat [ ] ; Non-healing sores [ ] ; Stroke [ ] ; TIA [ ] ; Slurred speech [ ] ;   Neuro: Headaches [ ] ; Vertigo [ ] ; Seizures [ ] ; Paresthesias [ ] ;Blurred vision [ ] ; Diplopia [ ] ; Vision changes [ ]    Ortho/Skin: Arthritis [ ] ; Joint pain [ ] ; Muscle pain [ ] ; Joint  swelling [ ] ; Back Pain [ ] ; Rash [ ]    Psych: Depression [ ] ; Anxiety [ ]    Heme: Bleeding problems [ ] ; Clotting disorders [ ] ;  Anemia [ ]    Endocrine: Diabetes [ ] ; Thyroid dysfunction [ ]    Physical Exam/Data:   Vitals:   12/11/21 0056 12/11/21 0428 12/11/21 1438 12/11/21 1600  BP: 97/76 104/67 110/62 (!) 120/96  Pulse: 74 74 76 84  Resp:  19 19   Temp:  98.7 F (37.1 C) 98.6 F (37 C) (!) 97.5 F (36.4 C)  TempSrc:   Oral Oral  SpO2:  97% 97% 100%  Weight:  105 kg  105.3 kg  Height:    6\' 1"  (1.854 m)    Intake/Output Summary (Last 24 hours) at 12/11/2021 1712 Last data filed at 12/11/2021 1300 Gross per 24 hour  Intake 1450 ml  Output 1200 ml  Net 250 ml   Last 3 Weights 12/11/2021 12/11/2021 12/10/2021  Weight (lbs) 232 lb 2.3 oz 231 lb 7.7 oz 225 lb 8.5 oz  Weight (kg) 105.3 kg 105 kg 102.3 kg     Body mass index is 30.63 kg/m.  General:  Well nourished, well developed, in no acute distress HEENT: normal Lymph: no adenopathy Neck: JVD to mandible sitting 90 deg upright  Endocrine:  No thryomegaly Vascular: No carotid bruits; FA pulses 2+ bilaterally without bruits  Cardiac:  normal S1, S2; RRR; no murmur  Lungs:  clear to auscultation bilaterally, no wheezing, rhonchi or rales  Abd: soft, nontender, no hepatomegaly  Ext: 3+ pitting edema to thighs, tense abdomen but non painful  Musculoskeletal:  No deformities, BUE and BLE strength normal and equal Skin: warm and dry  Neuro:  CNs 2-12 intact, no focal abnormalities noted Psych:  Normal affect   EKG:  The EKG was personally reviewed and demonstrates: (12/07/21, 08:56:24) sinus tach (106 bpm), PR 173, QRS 95, Qtc 471, no ischemic changes Telemetry:  Telemetry was personally reviewed and demonstrates: NSR 80s   Relevant CV Studies:  TTE ([facility]) Result date: 10/15/21 The left ventricle is mildly dilated.  Moderate left ventricular hypertrophy  Left ventricular systolic function is severely reduced.  LV  ejection fraction = 15-20%.  The right ventricle is mild to moderately dilated.  The right ventricular systolic function is moderately reduced.  The left atrium is moderately dilated.  The right atrium is mildly to moderately dilated.  Injection of agitated saline showed no right-to-left shunt.  There is mild mitral regurgitation.  There is moderate tricuspid regurgitation.  Mild pulmonary hypertension.  IVC size was moderately dilated.  There is trivia to small pericardial effusion.  There is no comparison study available.   Coronary angiography ([facility])  Result date: 07/06/21 1. Left Main -normal  2. Left anterior descending artery -10% mid, 10% distal  3. Diagonals -normal  4. Left Circumflex -25% mid  5. Obtuse Marginals -normal  6. Right Coronary Artery -normal  7. Posterior Descending Artery -normal  Hemodynamics  1. Aortic Pressure -140/70  mmHg  2. Left Ventricular - 140/33 mmHg  CONCLUSIONS:  1. Successful transradial cardiac catheterization  2. Mild nonobstructive coronary artery disease  3. Left ventricular solid pressure 33   Laboratory Data:  High Sensitivity Troponin:   Recent Labs  Lab 12/07/21 0901 12/07/21 1142  TROPONINIHS 21* 19*     Chemistry Recent Labs  Lab 12/09/21 0352 12/10/21 0145 12/11/21 0454  NA 127* 127* 122*  K 4.0 3.9 4.8  CL 99 94* 90*  CO2 22 23 23   GLUCOSE 111* 81 112*  BUN 37* 43* 49*  CREATININE 1.53* 2.03* 2.81*  CALCIUM 8.6* 8.3* 8.0*  GFRNONAA 53* 38* 25*  ANIONGAP 6 10 9     No results for input(s): PROT, ALBUMIN, AST, ALT, ALKPHOS, BILITOT in the last 168 hours. Hematology Recent Labs  Lab 12/07/21 0901 12/09/21 0352 12/10/21 0145  WBC 4.1 3.7* 4.2  RBC 4.76 4.31 4.39  HGB 12.9* 11.0* 11.8*  HCT 39.5 34.6* 36.0*  MCV 83.0 80.3 82.0  MCH 27.1 25.5* 26.9  MCHC 32.7 31.8 32.8  RDW 16.5* 15.9* 16.0*  PLT 132* 121* 129*   BNP Recent Labs  Lab 12/07/21 0902  BNP 1,324.0*    DDimer No results for  input(s): DDIMER in the last 168 hours.  Radiology/Studies:  No results found. { Assessment and Plan:   Acute on chronic decompensated HFrEF Hypervolemic hyponatremia  AKI on CKD  Mr. Hassell Done presents as a transfer from [facility] for ongoing management of acute on chronic decompensated heart failure, AKI on CKD and hypervolemic hyponatremia.  Urine output was initially adequate between 1500-2000 cc out daily and net -750 cc/day however it was not robust enough given his significant volume overload.  He had minimal urine output on 02/11 despite Lasix 80 mg IV twice daily.  His home dose of Lasix is 80 mg oral daily.  He had coronary angiography in September 2022 with mild nonobstructive CAD.  Echo with biventricular dysfunction with severely reduced EF 20-25%.  Last echo in our system was 10/18/19 with normal biV function (EF 60-65%). Repeat TTE in 09/2021 with EF 15-20%. Although he does have prior etoh hx, he hasn't had any etoh in several years.  - lasix 80 mg IV bid->lasix 120 mg x1 with 20 mg/h gtt, currently adequate UOP - if Na doesn't start to improve/stabilize will trial tolvaptan, currently diuresing well on lasix gtt so will hold off for now - lactate normal on admission and appears to be warm/well perfused just profoundly volume overload, may need dobutamine (and/or milrinone) to augment diuresis, haven't started yet with brisk diuresis on lasix and stabilization of sCr however if it starts to taper off we would start, ordered DL PICC as he will need frequent lab/electrolyte monitoring either way, prior hx of iHD x3 months as OP however renal fxn recovered (recent bl sCr 1.5). Prior [place] R sided now s/p removal (10/20/19). [place] for PICC, low likelihood of needing renal replacement therapy right now with good UOP and sCr stabilizing.  - trend BMP/Mg q12h, lactate daily, UOP goal net neg at least 2 L daily, bl weight ~205 lb, currently 231 lb  - GDMT: currently holding most of his GDMT given  AKI on CKD with significant worsening over past 48h and c/f low output HF. Home regimen of coreg 6.25 mg bid, lasix 80 mg daily, spiro 12.5 mg daily and recent addition of entresto 24/26 mg bid. Holding all meds currently, once more stable will transition to torsemide and add back coreg first. For now just continue with diuresis. Would also benefit from SGLT2i before discharge if affordable.  - repeat TTE today  - thrombocytopenia likely 2/2 known cirrhosis  - d/c home gabapentin with AKI, discussed with patient and he is agreeable  For questions or updates, please contact [place] Please consult [email or web address] for contact info under   Signed, Dion Body, MD  12/11/2021 5:12 PM

## 2021-12-11 NOTE — Progress Notes (Signed)
PROGRESS NOTE  Edward Rocha. PJK:[id number] DOB: [birth year] DOA: 12/07/2021 PCP: Neale Burly, MD  Brief History:  56 year old male with a history of diabetes mellitus type 2, NICM, peripheral arterial disease, COPD, alcohol abuse in remission presenting with 1 week history of shortness of breath that significantly worsened over the past 4 days.  The patient states that he has had increasing orthopnea, increasing lower extremity edema, and increasing abdominal girth.  He endorses compliance with his furosemide 80 mg daily.  Interestingly, the patient has not followed up with cardiology, Dr. Rozann Lesches since 07/22/2021.  The patient is unclear where he is actually getting his refills for his furosemide, but nevertheless states that he has been compliant with his furosemide.  Also, he endorses compliance with some other cardiac med the name of which he does not recall, but again does not know who has been refilling his medication.  However, the patient states that he is thirsty and drinks "plenty of fluid".  He states that he drinks at least three 20 ounces of water bottles in addition to the fluid during the period of the day.  He denies any fevers, chills, headache, chest pain, abdominal pain, dysuria, hematuria, hematochezia, melena.  He did have 1 episode of nausea and vomiting on 12/06/2021, but has not had any emesis since then.  He complains of a largely nonproductive cough but occasionally does bring up some yellow sputum.  He denies any hemoptysis. Notably, the patient was recently admitted to [facility] from 10/15/2021 to 10/21/2021 when he was treated for decompensated HFrEF.  He was diuresed with IV furosemide and metolazone.  Repeat echocardiogram (TTE) obtained during that hospital admission showed an EF of 15 to 20% with mild to moderate dilated RV and RV systolic function reduced.  He was also treated for influenza A and COPD exacerbation.  He was discharged home  with furosemide 80 mg twice daily, then down to 80 mg once daily.  During that hospitalization, he was started on carvedilol.  His losartan and metoprolol succinate were discontinued.  He was -15 L after diuresis during the hospitalization.  Serum creatinine was 1.66 at the time of discharge. ED In the ED, the patient was afebrile hemodynamically stable with oxygen saturation 90% on room air.  BMP showed sodium 128, potassium 4.5, serum creatinine 1.52.  WBC 4.1, hemoglobin 12.9, platelets 132,000.  Chest x-Mote showed cardiomegaly with pulmonary vascular congestion.  EKG shows sinus tachycardia with nonspecific ST changes.  BNP was 1224.  The patient was given furosemide 80 mg IV x1.  He was admitted for further evaluation and treatment of his CHF.  He was continued on lasix 80 mg IV bid.  Cardiology was consulted to assist with management. Carvedilol and Entresto were restarted along with spironolactone.  Unfortunately, the patient's blood pressure remained soft with SBP's in the low 90s and upper 80s.  As a result, the patient spironolactone and carvedilol were discontinued.  The patient serum creatinine gradually worsened from 1.5-2.8.  Unfortunately, the patient remained significantly fluid overloaded.  As result, there was concern for cardiorenal syndrome and the need for advanced heart failure care.  Therefore, the patient will be transferred to [facility] for heart failure team to evaluate     Assessment and Plan: * Acute on chronic systolic CHF (congestive heart failure) ([place])- (present on admission) Continue furosemide 80 mg IV twice daily Remains clinically fluid overloaded 2/9--give dose of metolazone  2.5 mg daily Apparently his Delene Loll was discontinued during his last hospital admission at [facility]>>Carvedilol was started and lieu of losartan and metoprolol succinate  Cardiology consult appreciated>>restart Delene Loll and spironolactone 2/10 10/15/2021 echo EF 15 to 20%,  moderately reduced RV, moderate TR, mild pulmonary hypertension Accurate I's and O's--NEG 5.1L in past 72 hrs Daily weights Hold coreg and spironolactone for soft BPs 12/10/21--hold entresto and spironolactone due to rise in serum creatinine -transfer [facility] for Advance CHF team eval  Acute respiratory failure with hypoxia ([place])- (present on admission) Due to pulmonary edema in setting of underlying COPD Stable on 2.5L Wean oxygen for saturation >90%  Acute renal failure superimposed on stage 3a chronic kidney disease (HCC) Baseline creatinine 1.5-1.7 -Now with development of cardiorenal syndrome Serum creatinine up to 2.81 despite holding Entresto and spironolactone -continue lasix 80 IV bid -Case discussed with cardiology, Dr. Quentin Ore -transfer to [facility] for Advance CHF team eval  Hyponatremia Due to CHF/fluid overload After initial improvement, sodium now dropping again  Mixed hyperlipidemia Continue statin  PAD (peripheral artery disease) ([place])- (present on admission) No claudication symptoms presently Continue aspirin  Thrombocytopenia ([place])- (present on admission) Secondary to chronic hepatic congestion and alcohol use Usual range 120-130 Monitor for signs of bleeding   Status is: Inpatient Remains inpatient appropriate because: remains clinically fluid overloaded requiring IV lasix and; now development of cardiorenal syndrome           Family Communication:   no Family at bedside   Consultants:  cardiology   Code Status:  FULL   DVT Prophylaxis:  [place] Lovenox     Procedures: As Listed in Progress Note Above   Antibiotics: None      Subjective: Patient states that his breathing a little better but continues to have orthopnea.  He cannot lay flat.  He has to sleep in a recliner.  He has dyspnea on exertion.  He denies any fever chills, headache, chest pain, coughing, hemoptysis, nausea, vomiting, diarrhea  Objective: Vitals:   12/10/21 1421  12/10/21 2310 12/11/21 0056 12/11/21 0428  BP: 92/64 (!) 81/60 97/76 104/67  Pulse: 71 71 74 74  Resp: 18 18  19   Temp: 97.6 F (36.4 C) 98.3 F (36.8 C)  98.7 F (37.1 C)  TempSrc:      SpO2: 100% 100%  97%  Weight:    105 kg  Height:        Intake/Output Summary (Last 24 hours) at 12/11/2021 1107 Last data filed at 12/11/2021 0300 Gross per 24 hour  Intake 1210 ml  Output 350 ml  Net 860 ml   Weight change: 2.7 kg Exam:  General:  Pt is alert, follows commands appropriately, not in acute distress HEENT: No icterus, No thrush, No neck mass, [place]/AT Cardiovascular: RRR, S1/S2, no rubs, no gallops Respiratory: Bilateral crackles; no wheezing Abdomen: Soft/+BS, non tender, non distended, no guarding Extremities: 2+ LEedema, No lymphangitis, No petechiae, No rashes, no synovitis   Data Reviewed: I have personally reviewed following labs and imaging studies Basic Metabolic Panel: Recent Labs  Lab 12/07/21 0901 12/08/21 0428 12/09/21 0352 12/10/21 0145 12/11/21 0454  NA 128* 129* 127* 127* 122*  K 4.5 4.1 4.0 3.9 4.8  CL 97* 100 99 94* 90*  CO2 23 21* 22 23 23   GLUCOSE 111* 107* 111* 81 112*  BUN 31* 35* 37* 43* 49*  CREATININE 1.52* 1.55* 1.53* 2.03* 2.81*  CALCIUM 8.8* 8.6* 8.6* 8.3* 8.0*  MG  --  2.0  2.0 1.9 2.0   Liver Function Tests: No results for input(s): AST, ALT, ALKPHOS, BILITOT, PROT, ALBUMIN in the last 168 hours. No results for input(s): LIPASE, AMYLASE in the last 168 hours. No results for input(s): AMMONIA in the last 168 hours. Coagulation Profile: Recent Labs  Lab 12/07/21 0901  INR 1.3*   CBC: Recent Labs  Lab 12/07/21 0901 12/09/21 0352 12/10/21 0145  WBC 4.1 3.7* 4.2  NEUTROABS 2.1  --   --   HGB 12.9* 11.0* 11.8*  HCT 39.5 34.6* 36.0*  MCV 83.0 80.3 82.0  PLT 132* 121* 129*   Cardiac Enzymes: No results for input(s): CKTOTAL, CKMB, CKMBINDEX, TROPONINI in the last 168 hours. BNP: Invalid input(s): POCBNP CBG: No results for  input(s): GLUCAP in the last 168 hours. HbA1C: No results for input(s): HGBA1C in the last 72 hours. Urine analysis:    Component Value Date/Time   COLORURINE AMBER (A) 08/13/2019 1309   APPEARANCEUR CLOUDY (A) 08/13/2019 1309   LABSPEC 1.014 08/13/2019 1309   PHURINE 5.0 08/13/2019 1309   GLUCOSEU >=500 (A) 08/13/2019 1309   HGBUR LARGE (A) 08/13/2019 1309   BILIRUBINUR NEGATIVE 08/13/2019 1309   KETONESUR NEGATIVE 08/13/2019 1309   PROTEINUR 100 (A) 08/13/2019 1309   NITRITE NEGATIVE 08/13/2019 1309   LEUKOCYTESUR NEGATIVE 08/13/2019 1309   Sepsis Labs: @LABRCNTIP (procalcitonin:4,lacticidven:4) ) Recent Results (from the past 240 hour(s))  Resp Panel by RT-PCR (Flu A&B, Covid) Nasopharyngeal Swab     Status: None   Collection Time: 12/07/21  9:08 AM   Specimen: Nasopharyngeal Swab; Nasopharyngeal(NP) swabs in vial transport medium  Result Value Ref Range Status   SARS Coronavirus 2 by RT PCR NEGATIVE NEGATIVE Final    Comment: (NOTE) SARS-CoV-2 target nucleic acids are NOT DETECTED.  The SARS-CoV-2 RNA is generally detectable in upper respiratory specimens during the acute phase of infection. The lowest concentration of SARS-CoV-2 viral copies this assay can detect is 138 copies/mL. A negative result does not preclude SARS-Cov-2 infection and should not be used as the sole basis for treatment or other patient management decisions. A negative result may occur with  improper specimen collection/handling, submission of specimen other than nasopharyngeal swab, presence of viral mutation(s) within the areas targeted by this assay, and inadequate number of viral copies(<138 copies/mL). A negative result must be combined with clinical observations, patient history, and epidemiological information. The expected result is Negative.  Fact Sheet for Patients:  EntrepreneurPulse.com.au  Fact Sheet for Healthcare Providers:   IncredibleEmployment.be  This test is no t yet approved or cleared by the [place] FDA and  has been authorized for detection and/or diagnosis of SARS-CoV-2 by FDA under an Emergency Use Authorization (EUA). This EUA will remain  in effect (meaning this test can be used) for the duration of the COVID-19 declaration under Section 564(b)(1) of the Act, 21 U.S.C.section 360bbb-3(b)(1), unless the authorization is terminated  or revoked sooner.       Influenza A by PCR NEGATIVE NEGATIVE Final   Influenza B by PCR NEGATIVE NEGATIVE Final    Comment: (NOTE) The Xpert Xpress SARS-CoV-2/FLU/RSV plus assay is intended as an aid in the diagnosis of influenza from Nasopharyngeal swab specimens and should not be used as a sole basis for treatment. Nasal washings and aspirates are unacceptable for Xpert Xpress SARS-CoV-2/FLU/RSV testing.  Fact Sheet for Patients: EntrepreneurPulse.com.au  Fact Sheet for Healthcare Providers: IncredibleEmployment.be  This test is not yet approved or cleared by the [place] and has been authorized for  detection and/or diagnosis of SARS-CoV-2 by FDA under an Emergency Use Authorization (EUA). This EUA will remain in effect (meaning this test can be used) for the duration of the COVID-19 declaration under Section 564(b)(1) of the Act, 21 U.S.C. section 360bbb-3(b)(1), unless the authorization is terminated or revoked.  Performed at [facility], [address] [place], [place] [address]      Scheduled Meds:  aspirin EC  81 mg Oral Daily   atorvastatin  40 mg Oral QHS   enoxaparin (LOVENOX) injection  40 mg Subcutaneous Daily   furosemide  80 mg Intravenous BID   gabapentin  600 mg Oral BID   melatonin  6 mg Oral QHS   sodium chloride flush  3 mL Intravenous Q12H   Continuous Infusions:  sodium chloride      Procedures/Studies: DG Chest Port 1 View  Result Date:  12/07/2021 CLINICAL DATA:  Shortness of breath, fluid retention EXAM: PORTABLE CHEST 1 VIEW COMPARISON:  11/10/2021 FINDINGS: Moderate cardiomegaly, stable from prior. Aortic atherosclerosis. Mild pulmonary vascular congestion with slight bilateral perihilar interstitial prominence. Small right pleural effusion. No pneumothorax. IMPRESSION: Cardiomegaly with pulmonary vascular congestion and small right pleural effusion. Electronically Signed   By: Davina Poke D.O.   On: 12/07/2021 09:41    Orson Eva, DO  [facility]  If 7PM-7AM, please contact night-coverage [email or web address] Password [email or web address] 12/11/2021, 11:07 AM   LOS: 4 days

## 2021-12-11 NOTE — Progress Notes (Signed)
Patient had soft bp 81/60, nurse rechecked bp manually and notified Dr. Josephine Cables. New orders in chart.

## 2021-12-11 NOTE — Progress Notes (Signed)
Care link has been given report 

## 2021-12-12 ENCOUNTER — Inpatient Hospital Stay: Payer: Self-pay

## 2021-12-12 ENCOUNTER — Encounter (HOSPITAL_COMMUNITY)
Admission: EM | Disposition: A | Discharge status: Home / Self Care | Payer: Self-pay | Source: Home / Self Care | Attending: Internal Medicine

## 2021-12-12 ENCOUNTER — Inpatient Hospital Stay (HOSPITAL_COMMUNITY): Payer: Medicare Other

## 2021-12-12 DIAGNOSIS — I5023 Acute on chronic systolic (congestive) heart failure: Secondary | ICD-10-CM

## 2021-12-12 DIAGNOSIS — E1169 Type 2 diabetes mellitus with other specified complication: Secondary | ICD-10-CM

## 2021-12-12 DIAGNOSIS — D61818 Other pancytopenia: Secondary | ICD-10-CM

## 2021-12-12 DIAGNOSIS — E785 Hyperlipidemia, unspecified: Secondary | ICD-10-CM

## 2021-12-12 DIAGNOSIS — K7460 Unspecified cirrhosis of liver: Secondary | ICD-10-CM

## 2021-12-12 DIAGNOSIS — R7989 Other specified abnormal findings of blood chemistry: Secondary | ICD-10-CM

## 2021-12-12 HISTORY — PX: RIGHT HEART CATH: CATH118263

## 2021-12-12 LAB — POCT I-STAT EG7
Acid-Base Excess: 0 mmol/L (ref 0.0–2.0)
Acid-Base Excess: 1 mmol/L (ref 0.0–2.0)
Bicarbonate: 26.4 mmol/L (ref 20.0–28.0)
Bicarbonate: 26.7 mmol/L (ref 20.0–28.0)
Calcium, Ion: 1.12 mmol/L — ABNORMAL LOW (ref 1.15–1.40)
Calcium, Ion: 1.15 mmol/L (ref 1.15–1.40)
HCT: 40 % (ref 39.0–52.0)
HCT: 40 % (ref 39.0–52.0)
Hemoglobin: 13.6 g/dL (ref 13.0–17.0)
Hemoglobin: 13.6 g/dL (ref 13.0–17.0)
O2 Saturation: 67 %
O2 Saturation: 69 %
Potassium: 4.6 mmol/L (ref 3.5–5.1)
Potassium: 4.6 mmol/L (ref 3.5–5.1)
Sodium: 127 mmol/L — ABNORMAL LOW (ref 135–145)
Sodium: 128 mmol/L — ABNORMAL LOW (ref 135–145)
TCO2: 28 mmol/L (ref 22–32)
TCO2: 28 mmol/L (ref 22–32)
pCO2, Ven: 47 mmHg (ref 44.0–60.0)
pCO2, Ven: 47.5 mmHg (ref 44.0–60.0)
pH, Ven: 7.357 (ref 7.250–7.430)
pH, Ven: 7.358 (ref 7.250–7.430)
pO2, Ven: 37 mmHg (ref 32.0–45.0)
pO2, Ven: 38 mmHg (ref 32.0–45.0)

## 2021-12-12 LAB — PROTIME-INR
INR: 1.2 (ref 0.8–1.2)
Prothrombin Time: 15.1 seconds (ref 11.4–15.2)

## 2021-12-12 LAB — COMPREHENSIVE METABOLIC PANEL
ALT: 14 U/L (ref 0–44)
AST: 29 U/L (ref 15–41)
Albumin: 2.2 g/dL — ABNORMAL LOW (ref 3.5–5.0)
Alkaline Phosphatase: 58 U/L (ref 38–126)
Anion gap: 9 (ref 5–15)
BUN: 47 mg/dL — ABNORMAL HIGH (ref 6–20)
CO2: 22 mmol/L (ref 22–32)
Calcium: 7.8 mg/dL — ABNORMAL LOW (ref 8.9–10.3)
Chloride: 92 mmol/L — ABNORMAL LOW (ref 98–111)
Creatinine, Ser: 2.86 mg/dL — ABNORMAL HIGH (ref 0.61–1.24)
GFR, Estimated: 25 mL/min — ABNORMAL LOW (ref >60)
Glucose, Bld: 115 mg/dL — ABNORMAL HIGH (ref 70–99)
Potassium: 4.4 mmol/L (ref 3.5–5.1)
Sodium: 123 mmol/L — ABNORMAL LOW (ref 135–145)
Total Bilirubin: 0.7 mg/dL (ref 0.3–1.2)
Total Protein: 7.5 g/dL (ref 6.5–8.1)

## 2021-12-12 LAB — CBC
HCT: 38.6 % — ABNORMAL LOW (ref 39.0–52.0)
Hemoglobin: 12.6 g/dL — ABNORMAL LOW (ref 13.0–17.0)
MCH: 26.2 pg (ref 26.0–34.0)
MCHC: 32.6 g/dL (ref 30.0–36.0)
MCV: 80.2 fL (ref 80.0–100.0)
Platelets: 128 10*3/uL — ABNORMAL LOW (ref 150–400)
RBC: 4.81 MIL/uL (ref 4.22–5.81)
RDW: 15.2 % (ref 11.5–15.5)
WBC: 3.8 10*3/uL — ABNORMAL LOW (ref 4.0–10.5)
nRBC: 0 % (ref 0.0–0.2)

## 2021-12-12 LAB — LACTIC ACID, PLASMA: Lactic Acid, Venous: 1.6 mmol/L (ref 0.5–1.9)

## 2021-12-12 LAB — ECHOCARDIOGRAM COMPLETE
AR max vel: 2.3 cm2
AV Peak grad: 4.7 mmHg
Ao pk vel: 1.08 m/s
Area-P 1/2: 3.83 cm2
Calc EF: 32.3 %
Height: 73 in
MV M vel: 4.38 m/s
MV Peak grad: 76.7 mmHg
S' Lateral: 4.8 cm
Single Plane A2C EF: 33.6 %
Single Plane A4C EF: 33.5 %
Weight: 3702.4 oz

## 2021-12-12 LAB — BASIC METABOLIC PANEL
Anion gap: 9 (ref 5–15)
Anion gap: 9 (ref 5–15)
BUN: 49 mg/dL — ABNORMAL HIGH (ref 6–20)
BUN: 50 mg/dL — ABNORMAL HIGH (ref 6–20)
CO2: 22 mmol/L (ref 22–32)
CO2: 24 mmol/L (ref 22–32)
Calcium: 7.8 mg/dL — ABNORMAL LOW (ref 8.9–10.3)
Calcium: 8 mg/dL — ABNORMAL LOW (ref 8.9–10.3)
Chloride: 92 mmol/L — ABNORMAL LOW (ref 98–111)
Chloride: 91 mmol/L — ABNORMAL LOW (ref 98–111)
Creatinine, Ser: 2.5 mg/dL — ABNORMAL HIGH (ref 0.61–1.24)
Creatinine, Ser: 2.16 mg/dL — ABNORMAL HIGH (ref 0.61–1.24)
GFR, Estimated: 29 mL/min — ABNORMAL LOW (ref >60)
GFR, Estimated: 35 mL/min — ABNORMAL LOW (ref >60)
Glucose, Bld: 140 mg/dL — ABNORMAL HIGH (ref 70–99)
Glucose, Bld: 141 mg/dL — ABNORMAL HIGH (ref 70–99)
Potassium: 4.3 mmol/L (ref 3.5–5.1)
Potassium: 4.4 mmol/L (ref 3.5–5.1)
Sodium: 123 mmol/L — ABNORMAL LOW (ref 135–145)
Sodium: 124 mmol/L — ABNORMAL LOW (ref 135–145)

## 2021-12-12 LAB — HEPATIC FUNCTION PANEL
ALT: 13 U/L (ref 0–44)
AST: 29 U/L (ref 15–41)
Albumin: 2 g/dL — ABNORMAL LOW (ref 3.5–5.0)
Alkaline Phosphatase: 52 U/L (ref 38–126)
Bilirubin, Direct: 0.2 mg/dL (ref 0.0–0.2)
Indirect Bilirubin: 0.3 mg/dL (ref 0.3–0.9)
Total Bilirubin: 0.5 mg/dL (ref 0.3–1.2)
Total Protein: 6.9 g/dL (ref 6.5–8.1)

## 2021-12-12 LAB — GLUCOSE, CAPILLARY: Glucose-Capillary: 91 mg/dL (ref 70–99)

## 2021-12-12 LAB — MRSA NEXT GEN BY PCR, NASAL: MRSA by PCR Next Gen: NOT DETECTED

## 2021-12-12 LAB — MAGNESIUM
Magnesium: 1.9 mg/dL (ref 1.7–2.4)
Magnesium: 2.1 mg/dL (ref 1.7–2.4)

## 2021-12-12 LAB — TSH: TSH: 6.178 u[IU]/mL — ABNORMAL HIGH (ref 0.350–4.500)

## 2021-12-12 SURGERY — RIGHT HEART CATH
Anesthesia: LOCAL

## 2021-12-12 MED ORDER — LIDOCAINE HCL (PF) 1 % IJ SOLN
INTRAMUSCULAR | Status: AC
  Filled 2021-12-12: qty 30

## 2021-12-12 MED ORDER — SODIUM CHLORIDE 0.9% FLUSH: 3.0000 mL | INTRAVENOUS | Status: DC | PRN

## 2021-12-12 MED ORDER — SODIUM CHLORIDE 0.9 % IV SOLN: INTRAVENOUS | Status: DC

## 2021-12-12 MED ORDER — MIDAZOLAM HCL 2 MG/2ML IJ SOLN
INTRAMUSCULAR | Status: AC
  Filled 2021-12-12: qty 2

## 2021-12-12 MED ORDER — INSULIN ASPART 100 UNIT/ML IJ SOLN
0.0000 [IU] | Freq: Three times a day (TID) | INTRAMUSCULAR | Status: DC
  Administered 2021-12-13 – 2021-12-14 (×3): 2 [IU] via SUBCUTANEOUS
  Administered 2021-12-14: 3 [IU] via SUBCUTANEOUS
  Administered 2021-12-16 – 2021-12-18 (×4): 2 [IU] via SUBCUTANEOUS

## 2021-12-12 MED ORDER — TOLVAPTAN 15 MG PO TABS
15.0000 mg | ORAL_TABLET | Freq: Once | ORAL | Status: AC
  Administered 2021-12-12: 15 mg via ORAL
  Filled 2021-12-12: qty 1

## 2021-12-12 MED ORDER — FENTANYL CITRATE (PF) 100 MCG/2ML IJ SOLN
INTRAMUSCULAR | Status: DC | PRN
  Administered 2021-12-12: 25 ug via INTRAVENOUS

## 2021-12-12 MED ORDER — SODIUM CHLORIDE 0.9% FLUSH
10.0000 mL | Freq: Two times a day (BID) | INTRAVENOUS | Status: DC
  Administered 2021-12-13 – 2021-12-19 (×10): 10 mL

## 2021-12-12 MED ORDER — SODIUM CHLORIDE 0.9% FLUSH
3.0000 mL | Freq: Two times a day (BID) | INTRAVENOUS | Status: DC
  Administered 2021-12-13 – 2021-12-18 (×8): 3 mL via INTRAVENOUS

## 2021-12-12 MED ORDER — HEPARIN (PORCINE) IN NACL 1000-0.9 UT/500ML-% IV SOLN
INTRAVENOUS | Status: AC
  Filled 2021-12-12: qty 500

## 2021-12-12 MED ORDER — CHLORHEXIDINE GLUCONATE CLOTH 2 % EX PADS
6.0000 | MEDICATED_PAD | Freq: Every day | CUTANEOUS | Status: DC
  Administered 2021-12-12 – 2021-12-18 (×6): 6 via TOPICAL

## 2021-12-12 MED ORDER — MIDAZOLAM HCL 2 MG/2ML IJ SOLN
INTRAMUSCULAR | Status: DC | PRN
  Administered 2021-12-12: 1 mg via INTRAVENOUS

## 2021-12-12 MED ORDER — LIDOCAINE HCL (PF) 1 % IJ SOLN
INTRAMUSCULAR | Status: DC | PRN
  Administered 2021-12-12: 2 mL

## 2021-12-12 MED ORDER — FENTANYL CITRATE (PF) 100 MCG/2ML IJ SOLN
INTRAMUSCULAR | Status: AC
  Filled 2021-12-12: qty 2

## 2021-12-12 MED ORDER — SODIUM CHLORIDE 0.9 % IV SOLN: 250.0000 mL | INTRAVENOUS | Status: DC | PRN

## 2021-12-12 MED ORDER — HEPARIN (PORCINE) IN NACL 1000-0.9 UT/500ML-% IV SOLN
INTRAVENOUS | Status: DC | PRN
  Administered 2021-12-12: 500 mL

## 2021-12-12 MED ORDER — MAGNESIUM SULFATE 2 GM/50ML IV SOLN
2.0000 g | Freq: Once | INTRAVENOUS | Status: AC
  Administered 2021-12-12: 2 g via INTRAVENOUS
  Filled 2021-12-12: qty 50

## 2021-12-12 MED ORDER — SODIUM CHLORIDE 0.9% FLUSH: 10.0000 mL | INTRAVENOUS | Status: DC | PRN

## 2021-12-12 MED ORDER — MILRINONE LACTATE IN DEXTROSE 20-5 MG/100ML-% IV SOLN
0.1250 ug/kg/min | INTRAVENOUS | Status: DC
  Administered 2021-12-12 – 2021-12-14 (×6): 0.25 ug/kg/min via INTRAVENOUS
  Administered 2021-12-15: 0.125 ug/kg/min via INTRAVENOUS
  Filled 2021-12-12 (×7): qty 100

## 2021-12-12 SURGICAL SUPPLY — 8 items
CATH SWAN GANZ 7F STRAIGHT (CATHETERS) ×1 IMPLANT
KIT HEART LEFT (KITS) ×2 IMPLANT
KIT MICROPUNCTURE NIT STIFF (SHEATH) ×1 IMPLANT
PACK CARDIAC CATHETERIZATION (CUSTOM PROCEDURE TRAY) ×2 IMPLANT
SHEATH PINNACLE 7F 10CM (SHEATH) ×1 IMPLANT
SHEATH PROBE COVER 6X72 (BAG) ×1 IMPLANT
SLEEVE REPOSITIONING LENGTH 30 (MISCELLANEOUS) ×1 IMPLANT
TRANSDUCER W/STOPCOCK (MISCELLANEOUS) ×2 IMPLANT

## 2021-12-12 NOTE — Progress Notes (Signed)
Mobility Specialist Progress Note:   12/12/21 1020  Mobility  Activity Ambulated with assistance in hallway  Level of Assistance Contact guard assist, steadying assist  Assistive Device Other (Comment) (IV Pole)  Distance Ambulated (ft) 240 ft  Activity Response Tolerated well  $Mobility charge 1 Mobility   Pt received with [place] on face, SpO2 99% on RA. Ambulated on RA, SpO2 98% throughout session. Pt with neuropathy at baseline, causing unsteadiness in gait, requiring CGA. Pt left sitting EOB on RA. RN notified.  Nelta Numbers Mobility Specialist  Phone: [phone]

## 2021-12-12 NOTE — Assessment & Plan Note (Addendum)
-  Could be EtOH induced, quit > 2 years ago  -no clinical signs of hepatic encephalopathy or decompensated liver failure -will send referral to gastroenterology

## 2021-12-12 NOTE — Assessment & Plan Note (Addendum)
CBGs are stable, continue sliding scale insulin

## 2021-12-12 NOTE — Progress Notes (Signed)
Progress Note   Patient: Edward Rocha. RWE:[id number] DOB: 05-Jul-1966 DOA: 12/07/2021     5 DOS: the patient was seen and examined on 12/12/2021   Brief hospital course: Edward Rocha was admitted to the hospital with the working diagnosis of decompensated heart failure.  Transferred from [facility] to [facility] for further cardiac evaluation.   56 year old male with a history of diabetes mellitus type 2, NICM, peripheral arterial disease, COPD, alcohol abuse in remission presenting with 1 week history of shortness of breath that significantly worsened over the past 4 days.  Positive orthopnea, increasing lower extremity edema, and increasing abdominal girth.    Recently admitted to [facility] from 10/15/2021 to 10/21/2021 when he was treated for decompensated HFrEF.  He was diuresed with IV furosemide and metolazone.  Repeat echocardiogram (TTE) obtained during that hospital admission showed an EF of 15 to 20% with mild to moderate dilated RV and RV systolic function reduced.  He was also treated for influenza A and COPD exacerbation.    In the ED, the patient was afebrile hemodynamically stable with oxygen saturation 90% on room air.  BMP showed sodium 128, potassium 4.5, serum creatinine 1.52.  WBC 4.1, hemoglobin 12.9, platelets 132,000.  Chest x-[place] showed cardiomegaly with pulmonary vascular congestion.    EKG shows sinus tachycardia with nonspecific ST changes.  BNP was 1224.  The patient was given furosemide 80 mg IV x1.  He was admitted for further evaluation and treatment of his CHF.  He was continued on lasix 80 mg IV bid.  Cardiology was consulted to assist with management. Carvedilol and Entresto were restarted along with spironolactone.  Unfortunately, the patient's blood pressure remained soft with SBP's in the low 90s and upper 80s.  As a result, the patient spironolactone and carvedilol were discontinued.  The patient serum creatinine gradually worsened from 1.5-2.8.  Unfortunately, the  patient remained significantly fluid overloaded.  As result, there was concern for cardiorenal syndrome and the need for advanced heart failure care.    Therefore, the patient was transferred to [facility] for heart failure team to evaluate.   Assessment and Plan: * Acute on chronic systolic CHF (congestive heart failure) ([place])- (present on admission) Echocardiogram with LV EF 20 to 2% with global hypokinesis. Moderate LVH, mild to moderate RV systolic reduction, RVSP 61,9 severe dilatation of right and left atrium, small pericardial effusion. Moderate tricuspid regurgitation.   Urine output over last 24 hrs is 3000.  Systolic blood pressure this am 120 mmHg.   Patient now on milrinone infusion for inotropic support.  Continue diuresis with IV furosemide high dose.  Continue close follow up on blood pressure.     Acute respiratory failure with hypoxia ([place])- (present on admission) Continue oxymetry monitoring and supplemental 02 per [place] This am patient on 2L/min nasal cannula with oxygen saturation 100%.   Acute renal failure superimposed on stage 3a chronic kidney disease ([place])- (present on admission) Hyponatremia.  Renal function with serum cr at 2.50 with K at 4,3 and NA 123. Bicarbonate at 22.  Plan to continue aggressive diuresis for hypervolemia. Follow up renal function in am, avoid hypotension and nephrotoxic medications.   PAD (peripheral artery disease) ([place])- (present on admission) Continue with antiplatelet therapy.   Pancytopenia ([place])- (present on admission) Cell count with wbc 3,8 with hgb 12,8 and hct at 38,6 plt is 128.   Cirrhosis of liver ([place])- (present on admission) No clinical signs of hepatic encephalopathy or decompensated liver failure.  Mixed hyperlipidemia Continue statin  Type 2 diabetes mellitus with hyperlipidemia ([place])- (present on admission) Continue glucose cover and monitoring with insulin sliding scale.  Continue with statin therapy.    Hyponatremia Due to CHF/fluid overload After initial improvement, sodium now dropping again        Subjective: Patient with no dyspnea, continue with lower extremity edema, no chest pain   Physical Exam: Vitals:   12/12/21 1421 12/12/21 1426 12/12/21 1431 12/12/21 1455  BP: (!) 136/91 124/88 124/88 (!) 129/95  Pulse: (!) 104 85 85 88  Resp: 20 (!) 22 17 15   Temp:      TempSrc:      SpO2: 99% 100% 97%   Weight:      Height:       Neurology awake and alert ENT with no pallor Cardiovascular with S1 and S2 present with no gallops, no murmurs, regular rhythm Moderated JVD Positive lower extremity edema ++ bilaterally  Respiratory with no wheezing or rhonchi, no rales Abdomen soft and non tender   Data Reviewed:    Family Communication: no family at the bedside    Disposition: Status is: Inpatient Remains inpatient appropriate because: heart failure management      Planned Discharge Destination: Home   Author: Tawni Millers, MD 12/12/2021 4:45 PM  For on call review [email or web address].

## 2021-12-12 NOTE — Assessment & Plan Note (Addendum)
Nonischemic cardiomyopathy, cath 9/22 with minimal CAD Echo 12/22 with EF 15-20%, moderate RV dysfunction -Heart failure team following, improved on IV Lasix gtt. and milrinone  -negative 23.8 L -Cardiac catheterization severe elevated right and left heart filling pressures, with low cardiac output -Entresto and Aldactone on hold with AKI, Coreg on hold with low output state -Off Lasix gtt. and milrinone now, now on torsemide, good urine output yesterday> 3 L -Cardiac MRI completed, results pending -Ambulate, PT eval-no follow-up recommended -Discharge planning, home tomorrow if stable, will need close follow-up

## 2021-12-12 NOTE — TOC Initial Note (Addendum)
Transition of Care (TOC) - Initial/Assessment Note    Patient Details  Name: Marguis Mathieson. MRN: [id number] Date of Birth: [birth year]  Transition of Care [facility]) CM/SW Contact:    Erenest Rasher, RN Phone Number: [phone] 12/12/2021, 11:35 AM  Clinical Narrative:                  HF TOC CM spoke to pt at bedside. States he drives to his appts. States he get short of breath when he is up moving around at home and will use his wife oxygen. Explained will have Unit RN check walking oxygen sats prior to dc for possible need for home oxygen.   Has scale at home. Reports weighing himself daily.    Expected Discharge Plan: Home/Self Care Barriers to Discharge: Continued Medical Work up   Patient Goals and CMS Choice Patient states their goals for this hospitalization and ongoing recovery are:: return home      Expected Discharge Plan and Services Expected Discharge Plan: Home/Self Care   Discharge Planning Services: CM Consult   Living arrangements for the past 2 months: Single Family Home                                      Prior Living Arrangements/Services Living arrangements for the past 2 months: Single Family Home Lives with:: Spouse, Relatives Patient language and need for interpreter reviewed:: Yes Do you feel safe going back to the place where you live?: Yes      Need for Family Participation in Patient Care: No (Comment) Care giver support system in place?: No (comment) Current home services: DME (rolling walker, cane) Criminal Activity/Legal Involvement Pertinent to Current Situation/Hospitalization: No - Comment as needed  Activities of Daily Living Home Assistive Devices/Equipment: None ADL Screening (condition at time of admission) Patient's cognitive ability adequate to safely complete daily activities?: Yes Is the patient deaf or have difficulty hearing?: No Does the patient have difficulty seeing, even when wearing glasses/contacts?:  No Does the patient have difficulty concentrating, remembering, or making decisions?: No Patient able to express need for assistance with ADLs?: Yes Does the patient have difficulty dressing or bathing?: No Independently performs ADLs?: Yes (appropriate for developmental age) Does the patient have difficulty walking or climbing stairs?: No Weakness of Legs: None Weakness of Arms/Hands: None  Permission Sought/Granted   Permission granted to share information with : Yes, Verbal Permission Granted  Share Information with NAME: Krishiv Sandler     Permission granted to share info w Relationship: wife  Permission granted to share info w Contact Information: [phone]  Emotional Assessment Appearance:: Appears stated age Attitude/Demeanor/Rapport: Engaged Affect (typically observed): Accepting Orientation: : Oriented to Self, Oriented to  Time, Oriented to Place, Oriented to Situation Alcohol / Substance Use: Not Applicable Psych Involvement: No (comment)  Admission diagnosis:  SOB (shortness of breath) [R06.02] Acute on chronic systolic CHF (congestive heart failure) (HCC) [I50.23] Acute on chronic congestive heart failure, unspecified heart failure type [facility]) [I50.9] Patient Active Problem List   Diagnosis Date Noted   Acute renal failure superimposed on stage 3a chronic kidney disease ([place]) 12/10/2021   Hyponatremia 12/08/2021   Acute on chronic systolic CHF (congestive heart failure) ([place]) 12/07/2021   Mixed hyperlipidemia 12/07/2021   Stage 3a chronic kidney disease (CKD) ([place]) 12/07/2021   Angioedema 10/18/2019   Acute heart failure with preserved ejection fraction (HFpEF) ([place]) 10/18/2019  Cirrhosis of liver ([place]) 10/18/2019   Acute exacerbation of CHF (congestive heart failure) ([place]) 10/18/2019   Altered mental status 09/21/2019   Pressure injury of skin 08/27/2019   Acute respiratory failure with hypoxia (HCC)    Aspiration into airway    Acute encephalopathy     Gastrointestinal hemorrhage    Endotracheal tube present    Nasogastric tube present    PAD (peripheral artery disease) ([place]) 08/11/2019   Thrombocytopenia ([place]) 05/29/2019   Diabetes ([place]) 05/29/2019   Critical lower limb ischemia ([place]) 04/22/2019   PCP:  Neale Burly, MD Pharmacy:   [facility] [place], [place] [address] [place] [address] Phone: [phone] Fax: [phone]     Social Determinants of Health (SDOH) Interventions    Readmission Risk Interventions Readmission Risk Prevention Plan 12/08/2021 09/23/2019  Transportation Screening Complete Complete  PCP or Specialist Appt within 3-5 Days - Complete  HRI or [place] - Not Complete  HRI or Home Care Consult comments - doing OP PT  Social Work Consult for [place] Planning/Counseling - Complete  Palliative Care Screening - Not Applicable  Medication Review Press photographer) - Complete  Some recent data might be hidden

## 2021-12-12 NOTE — Interval H&P Note (Signed)
History and Physical Interval Note:  12/12/2021 1:58 PM  Edward Rocha.  has presented today for surgery, with the diagnosis of heart failure.  The various methods of treatment have been discussed with the patient and family. After consideration of risks, benefits and other options for treatment, the patient has consented to  Procedure(s): RIGHT HEART CATH (N/A) as a surgical intervention.  The patient's history has been reviewed, patient examined, no change in status, stable for surgery.  I have reviewed the patient's chart and labs.  Questions were answered to the patient's satisfaction.     Tiena Manansala [employer]

## 2021-12-12 NOTE — Progress Notes (Signed)
Received return message from Dr. Loralie Champagne, PICC order to be discontinued.

## 2021-12-12 NOTE — Assessment & Plan Note (Addendum)
TSH is 6,178 Free T4 is within normal range, recommend repeat TSH in 6 weeks

## 2021-12-12 NOTE — H&P (View-Only) (Signed)
Advanced Heart Failure Team Consult Note   Primary Physician: Neale Burly, MD PCP-Cardiologist:  Rozann Lesches, MD  Reason for Consultation: CHF  HPI:    Edward Rocha. is seen today for evaluation of CHF at the request of Dr. Renella Cunas.   Patient has history of DM2, HTN, CKD stage 3, PAD, and ETOH cirrhosis (quit drinking and smoking in 2020).  He had left fem-pop bypass and iliac stenting in 10/20, hospitalization complicated by AKI requiring transient dialysis.  However, recent creatinine has been in the 1.5 range.    Patient was admitted at [facility] last fall with CHF, nonischemic cardiomyopathy.  Last echo in 12/22 showed EF 15-20% with moderate RV dysfunction (done at [facility]).  Cath at [facility] in 9/22 showed mild nonobstructive CAD.  He is normally followed by Dr. Domenic Polite in [place].   More recently, he reports dyspnea and peripheral edema worsening over the week prior to admission to [facility] on 2/8.  He was taking Lasix 40 mg bid at home.  In the ER, he was noted to be volume overloaded.  He was given IV Lasix and started on Entresto.  However, he developed low BP and AKI, and Coreg/Entresto/spironolactone were held starting 2/11.  He was given IVF bolus.  With AKI and ongoing volume overload, he was transferred to [facility].   At [facility], he was started on Lasix gtt 20 mg/hr.  Creatinine was 2.81 yesterday, down to 2.5 today.  Na remains low at 123.  No confusion, etc.  I/Os net negative 1837 yesterday, better diuresis.   Review of Systems: All systems reviewed and negative except as per HPI.   Home Medications Prior to Admission medications   Medication Sig Start Date End Date Taking? Authorizing Provider  acetaminophen (TYLENOL) 500 MG tablet Take 1,000 mg by mouth daily as needed for moderate pain or headache.   Yes [provider]  albuterol (VENTOLIN HFA) 108 (90 Base) MCG/ACT inhaler Inhale 1-2 puffs into the lungs every 6 (six) hours as  needed for wheezing or shortness of breath.   Yes [provider]  aspirin EC 81 MG tablet Take 81 mg by mouth daily. Swallow whole.   Yes [provider]  atorvastatin (LIPITOR) 40 MG tablet Take 40 mg by mouth at bedtime.   Yes [provider]  furosemide (LASIX) 40 MG tablet Take 1 tablet (40 mg total) by mouth 2 (two) times daily for 7 days. 11/10/21 12/07/21 Yes Noemi Chapel, MD  gabapentin (NEURONTIN) 300 MG capsule Take 600 mg by mouth 2 (two) times daily. 09/26/21  Yes [provider]  nitroGLYCERIN (NITROSTAT) 0.4 MG SL tablet Place 0.4 mg under the tongue every 5 (five) minutes as needed for chest pain.   Yes [provider]  spironolactone (ALDACTONE) 25 MG tablet Take 0.5 tablets (12.5 mg total) by mouth daily. 07/22/21  Yes Satira Sark, MD  sacubitril-valsartan (ENTRESTO) 24-26 MG Take 1 tablet by mouth 2 (two) times daily. Patient not taking: Reported on 12/07/2021 07/22/21   Satira Sark, MD    Past Medical History: 1. CKD stage 3: Baseline creatinine around 1.5.  Had AKI after vascular surgery in 10/20 requiring transient HD.  2. Type 2 diabetes 3. HTN  4. Hypothyroidism 5. Cirrhosis: Suspect due to ETOH.  6. Thrombocytopenia: Likely from cirrhosis. 7. PAD: 10/20 had left fem-pop bypass and iliac stenting.  8. Prior ETOH abuse (has quit).  9. Chronic systolic CHF: Nonischemic  cardiomyopathy.  Echo in 12/20 with EF 60-65%.  Echo in 12/22 ([facility]) with EF 15-20%, moderate RV dysfunction, mild MR. [facility] (9/22) with mild nonobstructive CAD.    Past Surgical History: Past Surgical History:  Procedure Laterality Date   ABDOMINAL AORTOGRAM W/LOWER EXTREMITY N/A 02/06/2019   Procedure: ABDOMINAL AORTOGRAM W/LOWER EXTREMITY;  Surgeon: [name], MD;  Location: [place] [facility];  Service: Cardiovascular;  Laterality: N/A;  bilateral   ABDOMINAL AORTOGRAM W/LOWER EXTREMITY N/A 04/30/2019   Procedure: ABDOMINAL AORTOGRAM  W/LOWER EXTREMITY;  Surgeon: [name], MD;  Location: [place] [facility];  Service: Cardiovascular;  Laterality: N/A;   AORTOGRAM  08/11/2019   Procedure: Aortogram;  Surgeon: [name], MD;  Location: [place];  Service: Vascular;;   ESOPHAGEAL BANDING  08/22/2019   Procedure: ESOPHAGEAL BANDING;  Surgeon: Milus Banister, MD;  Location: [facility] ENDOSCOPY;  Service: Endoscopy;;   ESOPHAGOGASTRODUODENOSCOPY N/A 08/22/2019   Procedure: ESOPHAGOGASTRODUODENOSCOPY (EGD);  Surgeon: Milus Banister, MD;  Location: [facility] ENDOSCOPY;  Service: Endoscopy;  Laterality: N/A;   FEMORAL-POPLITEAL BYPASS GRAFT Left 08/11/2019   Procedure: BYPASS LEFT FEMORAL-DISTAL POPLITEAL ARTERY USING PROPATEN GRAFT;  Surgeon: [name], MD;  Location: [place];  Service: Vascular;  Laterality: Left;   INSERTION OF DIALYSIS CATHETER Right 09/09/2019   Procedure: INSERTION OF DIALYSIS CATHETER;  Surgeon: Waynetta Sandy, MD;  Location: [place];  Service: Vascular;  Laterality: Right;   MULTIPLE TOOTH EXTRACTIONS     PERIPHERAL VASCULAR INTERVENTION  02/06/2019   Procedure: PERIPHERAL VASCULAR INTERVENTION;  Surgeon: [name], MD;  Location: [place] [facility];  Service: Cardiovascular;;  Bilateral Iliacs   PERIPHERAL VASCULAR INTERVENTION  04/30/2019   Procedure: PERIPHERAL VASCULAR INTERVENTION;  Surgeon: [name], MD;  Location: [place] [facility];  Service: Cardiovascular;;  Stent - Lt. Iliac    REMOVAL OF A DIALYSIS CATHETER Right 09/09/2019   Procedure: Removal Of A Dialysis Catheter;  Surgeon: Waynetta Sandy, MD;  Location: [place];  Service: Vascular;  Laterality: Right;   ULTRASOUND GUIDANCE FOR VASCULAR ACCESS Right 09/09/2019   Procedure: Ultrasound Guidance For Vascular Access;  Surgeon: Waynetta Sandy, MD;  Location: [place];  Service: Vascular;  Laterality: Right;   VASCULAR SURGERY      Family History: Family History  Problem Relation Age  of Onset   Alcohol abuse Father    Cancer Mother    Alcohol abuse Brother    Bipolar disorder Son    Bipolar disorder Daughter     Social History: Social History   Socioeconomic History   Marital status: Married    Spouse name: Not on file   Number of children: 2   Years of education: Not on file   Highest education level: Not on file  Occupational History   Not on file  Tobacco Use   Smoking status: Former    Packs/day: 1.00    Years: 36.00    Pack years: 36.00    Types: Cigarettes    Quit date: 01/25/2019    Years since quitting: 2.8   Smokeless tobacco: Never  Vaping Use   Vaping Use: Never used  Substance and Sexual Activity   Alcohol use: Not Currently    Comment: per pt's wife, pt is an alcoholic but just recently stopped drinking in 04/2019   Drug use: Never   Sexual activity: Not on file  Other Topics Concern   Not on file  Social History Narrative   Not  on file   Social Determinants of Health   Financial Resource Strain: Not on file  Food Insecurity: Not on file  Transportation Needs: Not on file  Physical Activity: Not on file  Stress: Not on file  Social Connections: Not on file    Allergies:  Allergies  Allergen Reactions   Other     Pt received platelets and had a bad reaction from infusion     Objective:    Vital Signs:   Temp:  [97.5 F (36.4 C)-98.6 F (37 C)] 98.1 F (36.7 C) (02/13 0722) Pulse Rate:  [76-88] 81 (02/13 0722) Resp:  [16-19] 16 (02/13 0722) BP: (110-146)/(62-96) 117/81 (02/13 0722) SpO2:  [97 %-100 %] 100 % (02/13 0722) Weight:  [105 kg-105.3 kg] 105 kg (02/13 0400) Last BM Date: 12/07/21  Weight change: Filed Weights   12/11/21 0428 12/11/21 1600 12/12/21 0400  Weight: 105 kg 105.3 kg 105 kg    Intake/Output:   Intake/Output Summary (Last 24 hours) at 12/12/2021 0857 Last data filed at 12/12/2021 0758 Gross per 24 hour  Intake 1402.96 ml  Output 3000 ml  Net -1597.04 ml      Physical Exam     General:  Well appearing. No resp difficulty HEENT: normal Neck: supple. JVP 16 cm. Carotids 2+ bilat; no bruits. No lymphadenopathy or thyromegaly appreciated. Cor: PMI lateral. Regular rate & rhythm. No rubs, gallops or murmurs. Lungs: clear Abdomen: soft, nontender, nondistended. No hepatosplenomegaly. No bruits or masses. Good bowel sounds. Extremities: no cyanosis, clubbing, rash. 2+ edema to torso.  Neuro: alert & orientedx3, cranial nerves grossly intact. moves all 4 extremities w/o difficulty. Affect pleasant   Telemetry   NSR 80s (personally reviewed)  EKG    Sinus tachy, left atrial enlargement (personally reviewed)  Labs   Basic Metabolic Panel: Recent Labs  Lab 12/09/21 0352 12/10/21 0145 12/11/21 0454 12/12/21 0015 12/12/21 0704  NA 127* 127* 122* 123* 123*  K 4.0 3.9 4.8 4.4 4.3  CL 99 94* 90* 92* 92*  CO2 22 23 23 22 22   GLUCOSE 111* 81 112* 115* 140*  BUN 37* 43* 49* 47* 49*  CREATININE 1.53* 2.03* 2.81* 2.86* 2.50*  CALCIUM 8.6* 8.3* 8.0* 7.8* 7.8*  MG 2.0 1.9 2.0 1.9 2.1    Liver Function Tests: Recent Labs  Lab 12/12/21 0015  AST 29  ALT 14  ALKPHOS 58  BILITOT 0.7  PROT 7.5  ALBUMIN 2.2*   No results for input(s): LIPASE, AMYLASE in the last 168 hours. No results for input(s): AMMONIA in the last 168 hours.  CBC: Recent Labs  Lab 12/07/21 0901 12/09/21 0352 12/10/21 0145 12/12/21 0015  WBC 4.1 3.7* 4.2 3.8*  NEUTROABS 2.1  --   --   --   HGB 12.9* 11.0* 11.8* 12.6*  HCT 39.5 34.6* 36.0* 38.6*  MCV 83.0 80.3 82.0 80.2  PLT 132* 121* 129* 128*    Cardiac Enzymes: No results for input(s): CKTOTAL, CKMB, CKMBINDEX, TROPONINI in the last 168 hours.  BNP: BNP (last 3 results) Recent Labs    07/17/21 1701 12/07/21 0902  BNP 1,662.0* 1,324.0*    ProBNP (last 3 results) No results for input(s): PROBNP in the last 8760 hours.   CBG: No results for input(s): GLUCAP in the last 168 hours.  Coagulation Studies: Recent  Labs    12/12/21 0015  LABPROT 15.1  INR 1.2     Imaging   [place] EKG SITE RITE  Result Date: 12/12/2021 If Site [employer]  not attached, placement could not be confirmed due to current cardiac rhythm.  [place] EKG SITE RITE  Result Date: 12/11/2021 If [facility] image not attached, placement could not be confirmed due to current cardiac rhythm.    Medications:     Current Medications:  aspirin EC  81 mg Oral Daily   atorvastatin  40 mg Oral QHS   enoxaparin (LOVENOX) injection  40 mg Subcutaneous Daily   melatonin  6 mg Oral QHS   sodium chloride flush  3 mL Intravenous Q12H   tolvaptan  15 mg Oral Once    Infusions:  sodium chloride     furosemide (LASIX) 200 mg in dextrose 5% 100 mL (2mg /mL) infusion 20 mg/hr (12/12/21 0154)     Assessment/Plan   1. Acute on chronic systolic CHF: Nonischemic cardiomyopathy.  Cath in 9/22 at [place] with minimal CAD.  Echo in 12/22 at [place] with EF 15-20%, moderate RV dysfunction, mild MR (prior echo in 2020 with EF 60-65%).  Cause of cardiomyopathy is uncertain, he has not drunk ETOH since 2020.  He is markedly volume overloaded on exam with creatinine 2.5 (baseline about 1.5).  Concern for low output HF with cardiorenal syndrome.  - Will plan RHC today, suspect he may need inotrope.  Will leave in [place] after cath.  - Continue Lasix gtt 20 mg/hr, good diuresis yesterday.  - Holding Coreg with concern for low output and marked volume overload.  - Holding Entresto, spironolactone with AKI.  - If BP tolerates, can add Bidil in the future.  - Unna boots.  - Cardiac MRI when creatinine stabilizes and volume better controlled.  2. AKI: On CKD stage 3.  Suspect cardiorenal syndrome.  - RHC today, add inotrope if low CO.  3. Hyponatremia: Hypervolemic hyponatremia.  Na 123 today, 122 yesterday.  - Fluid restrict 1200 cc today.  - Add tolvaptan 15 mg x 1 today.   - Repeat BMET in pm.  4. Cirrhosis: Thought to be due to ETOH, stopped since 2020.   5. DM2: SSI 6. PAD: 10/20 had left fem-pop bypass and iliac stenting.  - Continue ASA 81 - Continue atorvastatin 40 daily  Length of Stay: 5  Loralie Champagne, MD  12/12/2021, 8:57 AM  Advanced Heart Failure Team Pager [phone] (M-F; 7a - 5p)  Please contact [facility] for night-coverage after hours (4p -7a ) and weekends on [email or web address]

## 2021-12-12 NOTE — Consult Note (Signed)
Advanced Heart Failure Team Consult Note   Primary Physician: Neale Burly, MD PCP-Cardiologist:  Rozann Lesches, MD  Reason for Consultation: CHF  HPI:    Edward Rocha. is seen today for evaluation of CHF at the request of Dr. Renella Cunas.   Patient has history of DM2, HTN, CKD stage 3, PAD, and ETOH cirrhosis (quit drinking and smoking in 2020).  He had left fem-pop bypass and iliac stenting in 10/20, hospitalization complicated by AKI requiring transient dialysis.  However, recent creatinine has been in the 1.5 range.    Patient was admitted at [facility] last fall with CHF, nonischemic cardiomyopathy.  Last echo in 12/22 showed EF 15-20% with moderate RV dysfunction (done at [facility]).  Cath at [facility] in 9/22 showed mild nonobstructive CAD.  He is normally followed by Dr. Domenic Polite in [place].   More recently, he reports dyspnea and peripheral edema worsening over the week prior to admission to [facility] on 2/8.  He was taking Lasix 40 mg bid at home.  In the ER, he was noted to be volume overloaded.  He was given IV Lasix and started on Entresto.  However, he developed low BP and AKI, and Coreg/Entresto/spironolactone were held starting 2/11.  He was given IVF bolus.  With AKI and ongoing volume overload, he was transferred to [facility].   At [facility], he was started on Lasix gtt 20 mg/hr.  Creatinine was 2.81 yesterday, down to 2.5 today.  Na remains low at 123.  No confusion, etc.  I/Os net negative 1837 yesterday, better diuresis.   Review of Systems: All systems reviewed and negative except as per HPI.   Home Medications Prior to Admission medications   Medication Sig Start Date End Date Taking? Authorizing Provider  acetaminophen (TYLENOL) 500 MG tablet Take 1,000 mg by mouth daily as needed for moderate pain or headache.   Yes [provider]  albuterol (VENTOLIN HFA) 108 (90 Base) MCG/ACT inhaler Inhale 1-2 puffs into the lungs every 6 (six) hours as  needed for wheezing or shortness of breath.   Yes [provider]  aspirin EC 81 MG tablet Take 81 mg by mouth daily. Swallow whole.   Yes [provider]  atorvastatin (LIPITOR) 40 MG tablet Take 40 mg by mouth at bedtime.   Yes [provider]  furosemide (LASIX) 40 MG tablet Take 1 tablet (40 mg total) by mouth 2 (two) times daily for 7 days. 11/10/21 12/07/21 Yes Noemi Chapel, MD  gabapentin (NEURONTIN) 300 MG capsule Take 600 mg by mouth 2 (two) times daily. 09/26/21  Yes [provider]  nitroGLYCERIN (NITROSTAT) 0.4 MG SL tablet Place 0.4 mg under the tongue every 5 (five) minutes as needed for chest pain.   Yes [provider]  spironolactone (ALDACTONE) 25 MG tablet Take 0.5 tablets (12.5 mg total) by mouth daily. 07/22/21  Yes Satira Sark, MD  sacubitril-valsartan (ENTRESTO) 24-26 MG Take 1 tablet by mouth 2 (two) times daily. Patient not taking: Reported on 12/07/2021 07/22/21   Satira Sark, MD    Past Medical History: 1. CKD stage 3: Baseline creatinine around 1.5.  Had AKI after vascular surgery in 10/20 requiring transient HD.  2. Type 2 diabetes 3. HTN  4. Hypothyroidism 5. Cirrhosis: Suspect due to ETOH.  6. Thrombocytopenia: Likely from cirrhosis. 7. PAD: 10/20 had left fem-pop bypass and iliac stenting.  8. Prior ETOH abuse (has quit).  9. Chronic systolic CHF: Nonischemic  cardiomyopathy.  Echo in 12/20 with EF 60-65%.  Echo in 12/22 ([facility]) with EF 15-20%, moderate RV dysfunction, mild MR. [facility] (9/22) with mild nonobstructive CAD.    Past Surgical History: Past Surgical History:  Procedure Laterality Date   ABDOMINAL AORTOGRAM W/LOWER EXTREMITY N/A 02/06/2019   Procedure: ABDOMINAL AORTOGRAM W/LOWER EXTREMITY;  Surgeon: [name], MD;  Location: [place] [facility];  Service: Cardiovascular;  Laterality: N/A;  bilateral   ABDOMINAL AORTOGRAM W/LOWER EXTREMITY N/A 04/30/2019   Procedure: ABDOMINAL AORTOGRAM  W/LOWER EXTREMITY;  Surgeon: [name], MD;  Location: [place] [facility];  Service: Cardiovascular;  Laterality: N/A;   AORTOGRAM  08/11/2019   Procedure: Aortogram;  Surgeon: [name], MD;  Location: [place];  Service: Vascular;;   ESOPHAGEAL BANDING  08/22/2019   Procedure: ESOPHAGEAL BANDING;  Surgeon: Milus Banister, MD;  Location: [facility] ENDOSCOPY;  Service: Endoscopy;;   ESOPHAGOGASTRODUODENOSCOPY N/A 08/22/2019   Procedure: ESOPHAGOGASTRODUODENOSCOPY (EGD);  Surgeon: Milus Banister, MD;  Location: [facility] END[facility]COPY;  Service: Endoscopy;  Laterality: N/A;   FEMORAL-POPLITEAL BYPASS GRAFT Left 08/11/2019   Procedure: BYPASS LEFT FEMORAL-DISTAL POPLITEAL ARTERY USING PROPATEN GRAFT;  Surgeon: [name], MD;  Location: [place];  Service: Vascular;  Laterality: Left;   INSERTION OF DIALYSIS CATHETER Right 09/09/2019   Procedure: INSERTION OF DIALYSIS CATHETER;  Surgeon: Waynetta Sandy, MD;  Location: [place];  Service: Vascular;  Laterality: Right;   MULTIPLE TOOTH EXTRACTIONS     PERIPHERAL VASCULAR INTERVENTION  02/06/2019   Procedure: PERIPHERAL VASCULAR INTERVENTION;  Surgeon: [name], MD;  Location: [place] [facility];  Service: Cardiovascular;;  Bilateral Iliacs   PERIPHERAL VASCULAR INTERVENTION  04/30/2019   Procedure: PERIPHERAL VASCULAR INTERVENTION;  Surgeon: [name], MD;  Location: [place] [facility];  Service: Cardiovascular;;  Stent - Lt. Iliac    REMOVAL OF A DIALYSIS CATHETER Right 09/09/2019   Procedure: Removal Of A Dialysis Catheter;  Surgeon: Waynetta Sandy, MD;  Location: [place];  Service: Vascular;  Laterality: Right;   ULTRASOUND GUIDANCE FOR VASCULAR ACCESS Right 09/09/2019   Procedure: Ultrasound Guidance For Vascular Access;  Surgeon: Waynetta Sandy, MD;  Location: [place];  Service: Vascular;  Laterality: Right;   VASCULAR SURGERY      Family History: Family History  Problem Relation Age  of Onset   Alcohol abuse Father    Cancer Mother    Alcohol abuse Brother    Bipolar disorder Son    Bipolar disorder Daughter     Social History: Social History   Socioeconomic History   Marital status: Married    Spouse name: Not on file   Number of children: 2   Years of education: Not on file   Highest education level: Not on file  Occupational History   Not on file  Tobacco Use   Smoking status: Former    Packs/day: 1.00    Years: 36.00    Pack years: 36.00    Types: Cigarettes    Quit date: 01/25/2019    Years since quitting: 2.8   Smokeless tobacco: Never  Vaping Use   Vaping Use: Never used  Substance and Sexual Activity   Alcohol use: Not Currently    Comment: per pt's wife, pt is an alcoholic but just recently stopped drinking in 04/2019   Drug use: Never   Sexual activity: Not on file  Other Topics Concern   Not on file  Social History Narrative   Not  on file   Social Determinants of Health   Financial Resource Strain: Not on file  Food Insecurity: Not on file  Transportation Needs: Not on file  Physical Activity: Not on file  Stress: Not on file  Social Connections: Not on file    Allergies:  Allergies  Allergen Reactions   Other     Pt received platelets and had a bad reaction from infusion     Objective:    Vital Signs:   Temp:  [97.5 F (36.4 C)-98.6 F (37 C)] 98.1 F (36.7 C) (02/13 0722) Pulse Rate:  [76-88] 81 (02/13 0722) Resp:  [16-19] 16 (02/13 0722) BP: (110-146)/(62-96) 117/81 (02/13 0722) SpO2:  [97 %-100 %] 100 % (02/13 0722) Weight:  [105 kg-105.3 kg] 105 kg (02/13 0400) Last BM Date: 12/07/21  Weight change: Filed Weights   12/11/21 0428 12/11/21 1600 12/12/21 0400  Weight: 105 kg 105.3 kg 105 kg    Intake/Output:   Intake/Output Summary (Last 24 hours) at 12/12/2021 0857 Last data filed at 12/12/2021 0758 Gross per 24 hour  Intake 1402.96 ml  Output 3000 ml  Net -1597.04 ml      Physical Exam     General:  Well appearing. No resp difficulty HEENT: normal Neck: supple. JVP 16 cm. Carotids 2+ bilat; no bruits. No lymphadenopathy or thyromegaly appreciated. Cor: PMI lateral. Regular rate & rhythm. No rubs, gallops or murmurs. Lungs: clear Abdomen: soft, nontender, nondistended. No hepatosplenomegaly. No bruits or masses. Good bowel sounds. Extremities: no cyanosis, clubbing, rash. 2+ edema to torso.  Neuro: alert & orientedx3, cranial nerves grossly intact. moves all 4 extremities w/o difficulty. Affect pleasant   Telemetry   NSR 80s (personally reviewed)  EKG    Sinus tachy, left atrial enlargement (personally reviewed)  Labs   Basic Metabolic Panel: Recent Labs  Lab 12/09/21 0352 12/10/21 0145 12/11/21 0454 12/12/21 0015 12/12/21 0704  NA 127* 127* 122* 123* 123*  K 4.0 3.9 4.8 4.4 4.3  CL 99 94* 90* 92* 92*  CO2 22 23 23 22 22   GLUCOSE 111* 81 112* 115* 140*  BUN 37* 43* 49* 47* 49*  CREATININE 1.53* 2.03* 2.81* 2.86* 2.50*  CALCIUM 8.6* 8.3* 8.0* 7.8* 7.8*  MG 2.0 1.9 2.0 1.9 2.1    Liver Function Tests: Recent Labs  Lab 12/12/21 0015  AST 29  ALT 14  ALKPHOS 58  BILITOT 0.7  PROT 7.5  ALBUMIN 2.2*   No results for input(s): LIPASE, AMYLASE in the last 168 hours. No results for input(s): AMMONIA in the last 168 hours.  CBC: Recent Labs  Lab 12/07/21 0901 12/09/21 0352 12/10/21 0145 12/12/21 0015  WBC 4.1 3.7* 4.2 3.8*  NEUTROABS 2.1  --   --   --   HGB 12.9* 11.0* 11.8* 12.6*  HCT 39.5 34.6* 36.0* 38.6*  MCV 83.0 80.3 82.0 80.2  PLT 132* 121* 129* 128*    Cardiac Enzymes: No results for input(s): CKTOTAL, CKMB, CKMBINDEX, TROPONINI in the last 168 hours.  BNP: BNP (last 3 results) Recent Labs    07/17/21 1701 12/07/21 0902  BNP 1,662.0* 1,324.0*    ProBNP (last 3 results) No results for input(s): PROBNP in the last 8760 hours.   CBG: No results for input(s): GLUCAP in the last 168 hours.  Coagulation Studies: Recent  Labs    12/12/21 0015  LABPROT 15.1  INR 1.2     Imaging   [place] EKG SITE RITE  Result Date: 12/12/2021 If Site [place]  not attached, placement could not be confirmed due to current cardiac rhythm.  [place] EKG SITE RITE  Result Date: 12/11/2021 If [facility] image not attached, placement could not be confirmed due to current cardiac rhythm.    Medications:     Current Medications:  aspirin EC  81 mg Oral Daily   atorvastatin  40 mg Oral QHS   enoxaparin (LOVENOX) injection  40 mg Subcutaneous Daily   melatonin  6 mg Oral QHS   sodium chloride flush  3 mL Intravenous Q12H   tolvaptan  15 mg Oral Once    Infusions:  sodium chloride     furosemide (LASIX) 200 mg in dextrose 5% 100 mL (2mg /mL) infusion 20 mg/hr (12/12/21 0154)     Assessment/Plan   1. Acute on chronic systolic CHF: Nonischemic cardiomyopathy.  Cath in 9/22 at [place] with minimal CAD.  Echo in 12/22 at [facility] with EF 15-20%, moderate RV dysfunction, mild MR (prior echo in 2020 with EF 60-65%).  Cause of cardiomyopathy is uncertain, he has not drunk ETOH since 2020.  He is markedly volume overloaded on exam with creatinine 2.5 (baseline about 1.5).  Concern for low output HF with cardiorenal syndrome.  - Will plan RHC today, suspect he may need inotrope.  Will leave in [place] after cath.  - Continue Lasix gtt 20 mg/hr, good diuresis yesterday.  - Holding Coreg with concern for low output and marked volume overload.  - Holding Entresto, spironolactone with AKI.  - If BP tolerates, can add Bidil in the future.  - Unna boots.  - Cardiac MRI when creatinine stabilizes and volume better controlled.  2. AKI: On CKD stage 3.  Suspect cardiorenal syndrome.  - RHC today, add inotrope if low CO.  3. Hyponatremia: Hypervolemic hyponatremia.  Na 123 today, 122 yesterday.  - Fluid restrict 1200 cc today.  - Add tolvaptan 15 mg x 1 today.   - Repeat BMET in pm.  4. Cirrhosis: Thought to be due to ETOH, stopped since 2020.   5. DM2: SSI 6. PAD: 10/20 had left fem-pop bypass and iliac stenting.  - Continue ASA 81 - Continue atorvastatin 40 daily  Length of Stay: 5  Loralie Champagne, MD  12/12/2021, 8:57 AM  Advanced Heart Failure Team Pager [phone] (M-F; 7a - 5p)  Please contact [facility] for night-coverage after hours (4p -7a ) and weekends on [email or web address]

## 2021-12-12 NOTE — Progress Notes (Signed)
Spoke with Aundra Dubin MD re: PICC insertion, he said to hold off on it for now and will let the team know if PICC is still needed. Will follow up.

## 2021-12-12 NOTE — Assessment & Plan Note (Addendum)
-   Mild thrombocytopenia could be secondary to liver cirrhosis, monitor periodically

## 2021-12-12 NOTE — Care Management Important Message (Signed)
Important Message  Patient Details  Name: Edward Rocha. MRN: [id number] Date of Birth: 1966/02/11   Medicare Important Message Given:  Yes     [name] 12/12/2021, 8:24 AM

## 2021-12-13 ENCOUNTER — Encounter (HOSPITAL_COMMUNITY): Payer: Self-pay | Admitting: Cardiology

## 2021-12-13 ENCOUNTER — Inpatient Hospital Stay (HOSPITAL_COMMUNITY): Payer: Medicare Other

## 2021-12-13 DIAGNOSIS — I5023 Acute on chronic systolic (congestive) heart failure: Secondary | ICD-10-CM | POA: Diagnosis not present

## 2021-12-13 LAB — BASIC METABOLIC PANEL
Anion gap: 8 (ref 5–15)
Anion gap: 8 (ref 5–15)
Anion gap: 9 (ref 5–15)
BUN: 54 mg/dL — ABNORMAL HIGH (ref 6–20)
BUN: 53 mg/dL — ABNORMAL HIGH (ref 6–20)
BUN: 54 mg/dL — ABNORMAL HIGH (ref 6–20)
CO2: 24 mmol/L (ref 22–32)
CO2: 24 mmol/L (ref 22–32)
CO2: 25 mmol/L (ref 22–32)
Calcium: 8.1 mg/dL — ABNORMAL LOW (ref 8.9–10.3)
Calcium: 8.3 mg/dL — ABNORMAL LOW (ref 8.9–10.3)
Calcium: 8.2 mg/dL — ABNORMAL LOW (ref 8.9–10.3)
Chloride: 93 mmol/L — ABNORMAL LOW (ref 98–111)
Chloride: 92 mmol/L — ABNORMAL LOW (ref 98–111)
Chloride: 91 mmol/L — ABNORMAL LOW (ref 98–111)
Creatinine, Ser: 2.26 mg/dL — ABNORMAL HIGH (ref 0.61–1.24)
Creatinine, Ser: 2.17 mg/dL — ABNORMAL HIGH (ref 0.61–1.24)
Creatinine, Ser: 2.04 mg/dL — ABNORMAL HIGH (ref 0.61–1.24)
GFR, Estimated: 33 mL/min — ABNORMAL LOW (ref >60)
GFR, Estimated: 35 mL/min — ABNORMAL LOW (ref >60)
GFR, Estimated: 37 mL/min — ABNORMAL LOW (ref >60)
Glucose, Bld: 102 mg/dL — ABNORMAL HIGH (ref 70–99)
Glucose, Bld: 122 mg/dL — ABNORMAL HIGH (ref 70–99)
Glucose, Bld: 119 mg/dL — ABNORMAL HIGH (ref 70–99)
Potassium: 4.6 mmol/L (ref 3.5–5.1)
Potassium: 4.7 mmol/L (ref 3.5–5.1)
Potassium: 4.9 mmol/L (ref 3.5–5.1)
Sodium: 125 mmol/L — ABNORMAL LOW (ref 135–145)
Sodium: 124 mmol/L — ABNORMAL LOW (ref 135–145)
Sodium: 125 mmol/L — ABNORMAL LOW (ref 135–145)

## 2021-12-13 LAB — HEPATIC FUNCTION PANEL
ALT: 13 U/L (ref 0–44)
AST: 31 U/L (ref 15–41)
Albumin: 2.2 g/dL — ABNORMAL LOW (ref 3.5–5.0)
Alkaline Phosphatase: 50 U/L (ref 38–126)
Bilirubin, Direct: 0.4 mg/dL — ABNORMAL HIGH (ref 0.0–0.2)
Indirect Bilirubin: 0.5 mg/dL (ref 0.3–0.9)
Total Bilirubin: 0.9 mg/dL (ref 0.3–1.2)
Total Protein: 7.4 g/dL (ref 6.5–8.1)

## 2021-12-13 LAB — CBC
HCT: 33.9 % — ABNORMAL LOW (ref 39.0–52.0)
Hemoglobin: 11.3 g/dL — ABNORMAL LOW (ref 13.0–17.0)
MCH: 26.4 pg (ref 26.0–34.0)
MCHC: 33.3 g/dL (ref 30.0–36.0)
MCV: 79.2 fL — ABNORMAL LOW (ref 80.0–100.0)
Platelets: 122 10*3/uL — ABNORMAL LOW (ref 150–400)
RBC: 4.28 MIL/uL (ref 4.22–5.81)
RDW: 15.1 % (ref 11.5–15.5)
WBC: 3.7 10*3/uL — ABNORMAL LOW (ref 4.0–10.5)
nRBC: 0 % (ref 0.0–0.2)

## 2021-12-13 LAB — COOXEMETRY PANEL
Carboxyhemoglobin: 1.8 % — ABNORMAL HIGH (ref 0.5–1.5)
Methemoglobin: 0.8 % (ref 0.0–1.5)
O2 Saturation: 70.4 %
Total hemoglobin: 11 g/dL — ABNORMAL LOW (ref 12.0–16.0)

## 2021-12-13 LAB — GLUCOSE, CAPILLARY
Glucose-Capillary: 87 mg/dL (ref 70–99)
Glucose-Capillary: 128 mg/dL — ABNORMAL HIGH (ref 70–99)
Glucose-Capillary: 132 mg/dL — ABNORMAL HIGH (ref 70–99)

## 2021-12-13 LAB — T4, FREE: Free T4: 1.01 ng/dL (ref 0.61–1.12)

## 2021-12-13 MED ORDER — TOLVAPTAN 15 MG PO TABS
30.0000 mg | ORAL_TABLET | Freq: Once | ORAL | Status: AC
  Administered 2021-12-13: 30 mg via ORAL
  Filled 2021-12-13: qty 2

## 2021-12-13 MED ORDER — OXYCODONE HCL 5 MG PO TABS
5.0000 mg | ORAL_TABLET | ORAL | Status: DC | PRN
  Administered 2021-12-13 – 2021-12-17 (×14): 5 mg via ORAL
  Filled 2021-12-13 (×15): qty 1

## 2021-12-13 NOTE — Progress Notes (Signed)
Orthopedic Tech Progress Note Patient Details:  Edward Rocha 1966/02/07 [id number]  Ortho Devices Type of Ortho Device: Louretta Parma boot Ortho Device/Splint Location: BLE Ortho Device/Splint Interventions: Ordered, Application, Adjustment   Post Interventions Patient Tolerated: Well Instructions Provided: Care of device  [name] 12/13/2021, 12:50 PM

## 2021-12-13 NOTE — Progress Notes (Signed)
Patient ID: Edward Patricia., male   DOB: Mar 05, 1966, 56 y.o.   MRN: [id number]     Advanced Heart Failure Rounding Note  PCP-Cardiologist: Rozann Lesches, MD   Subjective:    Breathing better.  Still reports pain in legs.   He remains on milrinone 0.25, co-ox 70%.  Good diuresis yesterday, weight down 5 lbs.  Creatinine 2.5 => 2.26.   [place] numbers: PA 59/16 CVP 18-20 CI 2.7   RHC Procedural Findings: Hemodynamics (mmHg) RA mean 22 RV 79/28 PA 67/32, mean 42 PCWP mean 25 Oxygen saturations: PA 68% AO 100% Cardiac Output (Fick) 5.55  Cardiac Index (Fick) 2.43 PVR 3 WU Cardiac Output (Thermo) 3.64 Cardiac Index (Thermo) 1.59  PVR 4.7 WU  Objective:   Weight Range: 102.8 kg Body mass index is 29.9 kg/m.   Vital Signs:   Temp:  [97.2 F (36.2 C)-98.6 F (37 C)] 98.4 F (36.9 C) (02/14 0600) Pulse Rate:  [77-116] 90 (02/14 0600) Resp:  [9-34] 15 (02/14 0600) BP: (103-141)/(59-114) 110/85 (02/14 0600) SpO2:  [92 %-100 %] 92 % (02/14 0600) Weight:  [102.8 kg] 102.8 kg (02/14 0600) Last BM Date: 12/12/21  Weight change: Filed Weights   12/11/21 1600 12/12/21 0400 12/13/21 0600  Weight: 105.3 kg 105 kg 102.8 kg    Intake/Output:   Intake/Output Summary (Last 24 hours) at 12/13/2021 0726 Last data filed at 12/13/2021 1025 Gross per 24 hour  Intake 1213.77 ml  Output 3750 ml  Net -2536.23 ml      Physical Exam    General:  Well appearing. No resp difficulty HEENT: Normal Neck: Supple. JVP 14-16. Carotids 2+ bilat; no bruits. No lymphadenopathy or thyromegaly appreciated. Cor: PMI nondisplaced. Regular rate & rhythm. No rubs, gallops or murmurs. Lungs: Clear Abdomen: Soft, nontender, nondistended. No hepatosplenomegaly. No bruits or masses. Good bowel sounds. Extremities: No cyanosis, clubbing, rash. 2+ edema to thighs Neuro: Alert & orientedx3, cranial nerves grossly intact. moves all 4 extremities w/o difficulty. Affect pleasant   Telemetry   NSR  90s (personally reviewed)  Labs    CBC Recent Labs    12/12/21 0015 12/12/21 1417 12/12/21 1418 12/13/21 0345  WBC 3.8*  --   --  3.7*  HGB 12.6*   < > 13.6 11.3*  HCT 38.6*   < > 40.0 33.9*  MCV 80.2  --   --  79.2*  PLT 128*  --   --  122*   < > = values in this interval not displayed.   Basic Metabolic Panel Recent Labs    12/12/21 0015 12/12/21 0704 12/12/21 1417 12/12/21 1850 12/13/21 0345  NA 123* 123*   < > 124* 125*  K 4.4 4.3   < > 4.4 4.6  CL 92* 92*  --  91* 93*  CO2 22 22  --  24 24  GLUCOSE 115* 140*  --  141* 102*  BUN 47* 49*  --  50* 54*  CREATININE 2.86* 2.50*  --  2.16* 2.26*  CALCIUM 7.8* 7.8*  --  8.0* 8.1*  MG 1.9 2.1  --   --   --    < > = values in this interval not displayed.   Liver Function Tests Recent Labs    12/12/21 0015 12/12/21 0704  AST 29 29  ALT 14 13  ALKPHOS 58 52  BILITOT 0.7 0.5  PROT 7.5 6.9  ALBUMIN 2.2* 2.0*   No results for input(s): LIPASE, AMYLASE in the last 72 hours. Cardiac  Enzymes No results for input(s): CKTOTAL, CKMB, CKMBINDEX, TROPONINI in the last 72 hours.  BNP: BNP (last 3 results) Recent Labs    07/17/21 1701 12/07/21 0902  BNP 1,662.0* 1,324.0*    ProBNP (last 3 results) No results for input(s): PROBNP in the last 8760 hours.   D-Dimer No results for input(s): DDIMER in the last 72 hours. Hemoglobin A1C No results for input(s): HGBA1C in the last 72 hours. Fasting Lipid Panel No results for input(s): CHOL, HDL, LDLCALC, TRIG, CHOLHDL, LDLDIRECT in the last 72 hours. Thyroid Function Tests Recent Labs    12/12/21 0015  TSH 6.178*    Other results:   Imaging    CARDIAC CATHETERIZATION  Result Date: 12/12/2021 1. Severely elevated right and left heart filling pressures. 2. Low cardiac output by thermodilution, normal by Fick. 3. Primarily pulmonary venous hypertension. Needs extensive diuresis, will start milrinone with Lasix gtt ongoing.   ECHOCARDIOGRAM COMPLETE  Result  Date: 12/12/2021    ECHOCARDIOGRAM REPORT   Patient Name:   Edward Rocha. Date of Exam: 12/12/2021 Medical Rec #:  [id number]        Height:       73.0 in Accession #:    [id number]       Weight:       231.4 lb Date of Birth:  04-01-1966        BSA:          2.289 m Patient Age:    29 years         BP:           119/88 mmHg Patient Gender: M                HR:           83 bpm. Exam Location:  Inpatient Procedure: 2D Echo, Color Doppler and Cardiac Doppler Indications:    CHF  History:        Patient has prior history of Echocardiogram examinations. Risk                 Factors:Diabetes.  Sonographer:    Jyl Heinz Referring Phys: [id number] [place]  1. Left ventricular ejection fraction, by estimation, is 20 to 25%. The left ventricle has severely decreased function. The left ventricle demonstrates global hypokinesis. There is moderate left ventricular hypertrophy. Left ventricular diastolic parameters are consistent with Grade II diastolic dysfunction (pseudonormalization). Elevated left atrial pressure.  2. Right ventricular systolic function is mild to moderately reduced. The right ventricular size is moderately enlarged. There is moderately elevated pulmonary artery systolic pressure. The estimated right ventricular systolic pressure is 57.3 mmHg.  3. Left atrial size was severely dilated.  4. Right atrial size was severely dilated.  5. A small pericardial effusion is present.  6. The mitral valve is normal in structure. Trivial mitral valve regurgitation.  7. Tricuspid valve regurgitation is moderate.  8. The aortic valve is tricuspid. Aortic valve regurgitation is not visualized. Aortic valve sclerosis/calcification is present, without any evidence of aortic stenosis.  9. The inferior vena cava is dilated in size with <50% respiratory variability, suggesting right atrial pressure of 15 mmHg. FINDINGS  Left Ventricle: Left ventricular ejection fraction, by estimation, is 20 to 25%. The  left ventricle has severely decreased function. The left ventricle demonstrates global hypokinesis. The left ventricular internal cavity size was normal in size. There is moderate left ventricular hypertrophy. Left ventricular diastolic parameters are consistent with Grade II diastolic dysfunction (  pseudonormalization). Elevated left atrial pressure. Right Ventricle: The right ventricular size is moderately enlarged. Right vetricular wall thickness was not well visualized. Right ventricular systolic function is mildly reduced. There is moderately elevated pulmonary artery systolic pressure. The tricuspid regurgitant velocity is 3.03 m/s, and with an assumed right atrial pressure of 15 mmHg, the estimated right ventricular systolic pressure is 56.4 mmHg. Left Atrium: Left atrial size was severely dilated. Right Atrium: Right atrial size was severely dilated. Pericardium: A small pericardial effusion is present. Mitral Valve: The mitral valve is normal in structure. Trivial mitral valve regurgitation. Tricuspid Valve: The tricuspid valve is normal in structure. Tricuspid valve regurgitation is moderate. Aortic Valve: The aortic valve is tricuspid. Aortic valve regurgitation is not visualized. Aortic valve sclerosis/calcification is present, without any evidence of aortic stenosis. Aortic valve peak gradient measures 4.7 mmHg. Pulmonic Valve: The pulmonic valve was grossly normal. Pulmonic valve regurgitation is trivial. Aorta: The aortic root and ascending aorta are structurally normal, with no evidence of dilitation. Venous: The inferior vena cava is dilated in size with less than 50% respiratory variability, suggesting right atrial pressure of 15 mmHg. IAS/Shunts: The interatrial septum was not well visualized.  LEFT VENTRICLE PLAX 2D LVIDd:         5.40 cm      Diastology LVIDs:         4.80 cm      LV e' medial:    4.29 cm/s LV PW:         1.40 cm      LV E/e' medial:  28.0 LV IVS:        1.20 cm      LV e'  lateral:   5.96 cm/s LVOT diam:     2.10 cm      LV E/e' lateral: 20.1 LV SV:         47 LV SV Index:   21 LVOT Area:     3.46 cm  LV Volumes (MOD) LV vol d, MOD A2C: 265.0 ml LV vol d, MOD A4C: 197.0 ml LV vol s, MOD A2C: 176.0 ml LV vol s, MOD A4C: 131.0 ml LV SV MOD A2C:     89.0 ml LV SV MOD A4C:     197.0 ml LV SV MOD BP:      74.5 ml RIGHT VENTRICLE            IVC RV Basal diam:  4.90 cm    IVC diam: 2.50 cm RV Mid diam:    3.50 cm RV S prime:     6.75 cm/s TAPSE (M-mode): 1.7 cm LEFT ATRIUM              Index        RIGHT ATRIUM           Index LA diam:        5.20 cm  2.27 cm/m   RA Area:     32.50 cm LA Vol (A2C):   117.0 ml 51.11 ml/m  RA Volume:   134.00 ml 58.54 ml/m LA Vol (A4C):   104.0 ml 45.43 ml/m LA Biplane Vol: 112.0 ml 48.93 ml/m  AORTIC VALVE AV Area (Vmax): 2.30 cm AV Vmax:        108.00 cm/s AV Peak Grad:   4.7 mmHg LVOT Vmax:      71.80 cm/s LVOT Vmean:     51.900 cm/s LVOT VTI:       0.137 m  AORTA Ao Root diam: 3.30  cm Ao Asc diam:  3.10 cm MITRAL VALVE                TRICUSPID VALVE MV Area (PHT): 3.83 cm     TR Peak grad:   36.7 mmHg MV Decel Time: 198 msec     TR Vmax:        303.00 cm/s MR Peak grad: 76.7 mmHg MR Vmax:      438.00 cm/s   SHUNTS MV E velocity: 120.00 cm/s  Systemic VTI:  0.14 m MV A velocity: 67.70 cm/s   Systemic Diam: 2.10 cm MV E/A ratio:  1.77 Oswaldo Milian MD Electronically signed by Oswaldo Milian MD Signature Date/Time: 12/12/2021/12:08:18 PM    Final      Medications:     Scheduled Medications:  aspirin EC  81 mg Oral Daily   atorvastatin  40 mg Oral QHS   Chlorhexidine Gluconate Cloth  6 each Topical Daily   enoxaparin (LOVENOX) injection  40 mg Subcutaneous Daily   insulin aspart  0-15 Units Subcutaneous TID WC   melatonin  6 mg Oral QHS   sodium chloride flush  10-40 mL Intracatheter Q12H   sodium chloride flush  3 mL Intravenous Q12H   sodium chloride flush  3 mL Intravenous Q12H   tolvaptan  30 mg Oral Once     Infusions:  sodium chloride     furosemide (LASIX) 200 mg in dextrose 5% 100 mL (2mg /mL) infusion 20 mg/hr (12/13/21 0641)   milrinone 0.25 mcg/kg/min (12/13/21 0600)    PRN Medications: sodium chloride, acetaminophen, ondansetron (ZOFRAN) IV, sodium chloride flush, sodium chloride flush   Assessment/Plan   1. Acute on chronic systolic CHF: Nonischemic cardiomyopathy.  Cath in 9/22 at [place] with minimal CAD.  Echo in 12/22 at [place] with EF 15-20%, moderate RV dysfunction, mild MR (prior echo in 2020 with EF 60-65%).  Cause of cardiomyopathy is uncertain, he has not drunk ETOH since 2020.  RHC on 2/13 showed elevated RA pressure and PCWP with CI 1.59 by thermodilution.  He was started on milrinone gtt 0.25.  This morning, good cardiac output and co-ox 70%, he diuresed well on Lasix gtt 20 mg/hr + tolvaptan with weight down 5 lbs.  CVP still 18-20.  - Continue Lasix gtt 20 mg/hr, repeat BMET in pm - Will give a dose of tolvaptan today. - Continue milrinone 0.25.  - Holding Coreg with low output and marked volume overload.  - Holding Entresto, spironolactone with AKI.  - If BP tolerates, can add Bidil in the future.  - Unna boots.  - Cardiac MRI when creatinine stabilizes and volume better controlled.  2. AKI: On CKD stage 3.  Suspect cardiorenal syndrome. Creatinine 2.26 => 2.5.  - Continue milrinone to maintain CO.  - Avoid hypotension.  3. Hyponatremia: Hypervolemic hyponatremia.  Na 125 today, 123 yesterday.  - Fluid restrict 1200 cc today.  - Add tolvaptan 30 mg x 1 today.   - Repeat BMET in pm.  4. Cirrhosis: Thought to be due to ETOH, stopped since 2020.  5. DM2: SSI 6. PAD: 10/20 had left fem-pop bypass and iliac stenting.  - Continue ASA 81 - Continue atorvastatin 40 daily  CRITICAL CARE Performed by: Loralie Champagne  Total critical care time: 35 minutes  Critical care time was exclusive of separately billable procedures and treating other patients.  Critical care  was necessary to treat or prevent imminent or life-threatening deterioration.  Critical care was time spent personally by me on  the following activities: development of treatment plan with patient and/or surrogate as well as nursing, discussions with consultants, evaluation of patient's response to treatment, examination of patient, obtaining history from patient or surrogate, ordering and performing treatments and interventions, ordering and review of laboratory studies, ordering and review of radiographic studies, pulse oximetry and re-evaluation of patient's condition.   Length of Stay: 6  Loralie Champagne, MD  12/13/2021, 7:26 AM  Advanced Heart Failure Team Pager [phone] (M-F; 7a - 5p)  Please contact [facility] for night-coverage after hours (5p -7a ) and weekends on [email or web address]

## 2021-12-13 NOTE — Progress Notes (Signed)
Pharmacist Heart Failure Core Measure Documentation  Assessment: Edward Rocha. has an EF documented as 15% on 12/22 by ECHO.  Rationale: Heart failure patients with left ventricular systolic dysfunction (LVSD) and an EF < 40% should be prescribed an angiotensin converting enzyme inhibitor (ACEI) or angiotensin receptor blocker (ARB) at discharge unless a contraindication is documented in the medical record.  This patient is not currently on an ACEI or ARB for HF.  This note is being placed in the record in order to provide documentation that a contraindication to the use of these agents is present for this encounter.  ACE Inhibitor or Angiotensin Receptor Blocker is contraindicated (specify all that apply)  []   ACEI allergy AND ARB allergy []   Angioedema []   Moderate or severe aortic stenosis []   Hyperkalemia [x]   Hypotension []   Renal artery stenosis [x]   Worsening renal function, preexisting renal disease or dysfunction   [name] Pharm.D. CPP, BCPS Clinical Pharmacist [phone] 12/13/2021 11:11 AM

## 2021-12-13 NOTE — Progress Notes (Signed)
Progress Note   Patient: Edward Rocha. JKK:[id number] DOB: Apr 26, 1966 DOA: 12/07/2021     6 DOS: the patient was seen and examined on 12/13/2021   Brief hospital course: Mr. Disano was admitted to the hospital with the working diagnosis of decompensated heart failure.  Transferred from [facility] to [facility] for further cardiac evaluation.   56 year old male with a history of diabetes mellitus type 2, NICM, peripheral arterial disease, COPD, alcohol abuse in remission presenting with 1 week history of shortness of breath that significantly worsened over the past 4 days.  Positive orthopnea, increasing lower extremity edema, and increasing abdominal girth.    Recently admitted to [facility] from 10/15/2021 to 10/21/2021 when he was treated for decompensated HFrEF.  He was diuresed with IV furosemide and metolazone.  Repeat echocardiogram (TTE) obtained during that hospital admission showed an EF of 15 to 20% with mild to moderate dilated RV and RV systolic function reduced.  He was also treated for influenza A and COPD exacerbation.    In the ED, the patient was afebrile hemodynamically stable with oxygen saturation 90% on room air.  BMP showed sodium 128, potassium 4.5, serum creatinine 1.52.  WBC 4.1, hemoglobin 12.9, platelets 132,000.  Chest x-Suchecki showed cardiomegaly with pulmonary vascular congestion.    EKG shows sinus tachycardia with nonspecific ST changes.  BNP was 1224.  The patient was given furosemide 80 mg IV x1.  He was admitted for further evaluation and treatment of his CHF.  He was continued on lasix 80 mg IV bid.  Cardiology was consulted to assist with management. Carvedilol and Entresto were restarted along with spironolactone.  Unfortunately, the patient's blood pressure remained soft with SBP's in the low 90s and upper 80s.  As a result, the patient spironolactone and carvedilol were discontinued.  The patient serum creatinine gradually worsened from 1.5-2.8.  Unfortunately, the  patient remained significantly fluid overloaded.  As result, there was concern for cardiorenal syndrome and the need for advanced heart failure care.    Therefore, the patient was transferred to [facility] for heart failure team to evaluate.   02/13 cardiac catheterization with elevated filling pressures and low cardiac output. Placed on milrinone and furosemide continuous infusions and transferred to [facility].   Assessment and Plan: * Acute on chronic systolic CHF (congestive heart failure) ([place])- (present on admission) Echocardiogram with LV EF 20 to 2% with global hypokinesis. Moderate LVH, mild to moderate RV systolic reduction, RVSP 71,6 severe dilatation of right and left atrium, small pericardial effusion. Moderate tricuspid regurgitation.   Cardiac catheterization severe elevated right and left heart filling pressures, with low cardiac output per thermodilution and pulmonary hypertension (primary).  Patient has been transferred to [place], and placed on inotropic support and furosemide drip. Pulmonary catheter in place to titrate medical therapy.   Urine output over last 24 hrs is 3750.  Systolic blood pressure in the range of 105 mmHg.   Medical therapy with furosemide drip 20 mg per hr and milrinone. tolvaptan dose today. (had one dose yesterday).   Acute respiratory failure with hypoxia ([place])- (present on admission) Oxymetry this am is 93% on room air.   Acute renal failure superimposed on stage 3a chronic kidney disease ([place])- (present on admission) Hyponatremia.  Patient continue to have hypervolemia. Renal function with serum cr at 2,17 with K at 4,7 and serum bicarbonate at 24 with Na at 124. Plan to continue diuresis with furosemide and aid of tolvaptan.  Follow up renal function in  am, avoid hypotension and nephrotoxic medications.  Check Mag level in am.   PAD (peripheral artery disease) ([place])- (present on admission) On antiplatelet therapy Patient has lower extremity pain,  in the setting of low cardiac output heart failure.  Add oxycodone as needed for pain control   Pancytopenia ([place])- (present on admission) Persistent pancytopenia, probably multifactorial. Wbc is 3,7, hgb 11,3 and hct at 33,9 with plt 122  Follow up cell count closely   Cirrhosis of liver ([place])- (present on admission) No clinical signs of hepatic encephalopathy or decompensated liver failure.   Mixed hyperlipidemia Continue statin  Type 2 diabetes mellitus with hyperlipidemia ([place])- (present on admission) Fasting glucose this am at 122, continue insulin sliding scale for glucose cover and monitoring. Continue with statin therapy.   Elevated TSH TSH is 6,178 Free T4 is within normal range. Patient with no clinical signs of hypothyroid, likely sick euthyroid syndrome.   Hyponatremia Due to CHF/fluid overload After initial improvement, sodium now dropping again        Subjective: Patient continue to have dyspnea and lower extremity edema, positive pain at his legs bilaterally sharp moderate to severe in intensity   Physical Exam: Vitals:   12/13/21 0400 12/13/21 0500 12/13/21 0600 12/13/21 0800  BP: 113/73 (!) 125/108 110/85   Pulse: (!) 116 91 90 (!) 104  Resp: 15 17 15 13   Temp: 98.6 F (37 C) 97.9 F (36.6 C) 98.4 F (36.9 C) 98.4 F (36.9 C)  TempSrc: Core   Core  SpO2: 95% 95% 92% 93%  Weight:   102.8 kg   Height:       Neurology awake and alert ENT with mild pallor Cardiovascular with S1 and S2 present and rhythmic, with no murmurs or gallops Moderate JVD Positive lower extremity edema +++ pitting bilaterally  Respiratory with no wheezing or rhonchi, positive rales at the dependent zones, anterior auscultation  Abdomen soft and not distended.,   Data Reviewed:    Family Communication: no family at the bedside   Disposition: Status is: Inpatient Remains inpatient appropriate because: heart failure management      Planned Discharge Destination:  Home     Author: Tawni Millers, MD 12/13/2021 9:49 AM  For on call review [email or web address].

## 2021-12-14 ENCOUNTER — Other Ambulatory Visit (HOSPITAL_COMMUNITY): Payer: Self-pay

## 2021-12-14 DIAGNOSIS — I5023 Acute on chronic systolic (congestive) heart failure: Secondary | ICD-10-CM | POA: Diagnosis not present

## 2021-12-14 LAB — GLUCOSE, CAPILLARY
Glucose-Capillary: 105 mg/dL — ABNORMAL HIGH (ref 70–99)
Glucose-Capillary: 148 mg/dL — ABNORMAL HIGH (ref 70–99)
Glucose-Capillary: 169 mg/dL — ABNORMAL HIGH (ref 70–99)
Glucose-Capillary: 211 mg/dL — ABNORMAL HIGH (ref 70–99)

## 2021-12-14 LAB — CBC
HCT: 35.7 % — ABNORMAL LOW (ref 39.0–52.0)
Hemoglobin: 12 g/dL — ABNORMAL LOW (ref 13.0–17.0)
MCH: 26 pg (ref 26.0–34.0)
MCHC: 33.6 g/dL (ref 30.0–36.0)
MCV: 77.4 fL — ABNORMAL LOW (ref 80.0–100.0)
Platelets: 113 10*3/uL — ABNORMAL LOW (ref 150–400)
RBC: 4.61 MIL/uL (ref 4.22–5.81)
RDW: 15 % (ref 11.5–15.5)
WBC: 5 10*3/uL (ref 4.0–10.5)
nRBC: 0 % (ref 0.0–0.2)

## 2021-12-14 LAB — BASIC METABOLIC PANEL
Anion gap: 9 (ref 5–15)
BUN: 51 mg/dL — ABNORMAL HIGH (ref 6–20)
CO2: 25 mmol/L (ref 22–32)
Calcium: 8.3 mg/dL — ABNORMAL LOW (ref 8.9–10.3)
Chloride: 92 mmol/L — ABNORMAL LOW (ref 98–111)
Creatinine, Ser: 1.85 mg/dL — ABNORMAL HIGH (ref 0.61–1.24)
GFR, Estimated: 42 mL/min — ABNORMAL LOW (ref >60)
Glucose, Bld: 97 mg/dL (ref 70–99)
Potassium: 4.4 mmol/L (ref 3.5–5.1)
Sodium: 126 mmol/L — ABNORMAL LOW (ref 135–145)

## 2021-12-14 LAB — COOXEMETRY PANEL
Carboxyhemoglobin: 2.4 % — ABNORMAL HIGH (ref 0.5–1.5)
Methemoglobin: 0.4 % (ref 0.0–1.5)
O2 Saturation: 75 %
Total hemoglobin: 12 g/dL (ref 12.0–16.0)

## 2021-12-14 LAB — MAGNESIUM: Magnesium: 2 mg/dL (ref 1.7–2.4)

## 2021-12-14 MED ORDER — HEPARIN SODIUM (PORCINE) 1000 UNIT/ML IJ SOLN
INTRAMUSCULAR | Status: AC
  Filled 2021-12-14: qty 1

## 2021-12-14 MED ORDER — TOLVAPTAN 15 MG PO TABS
30.0000 mg | ORAL_TABLET | Freq: Once | ORAL | Status: AC
  Administered 2021-12-14: 30 mg via ORAL
  Filled 2021-12-14: qty 2

## 2021-12-14 MED ORDER — ISOSORB DINITRATE-HYDRALAZINE 20-37.5 MG PO TABS
0.5000 | ORAL_TABLET | Freq: Three times a day (TID) | ORAL | Status: DC
  Administered 2021-12-14 – 2021-12-17 (×12): 0.5 via ORAL
  Filled 2021-12-14 (×13): qty 1

## 2021-12-14 NOTE — Progress Notes (Signed)
PROGRESS NOTE    Edward Rocha.  ACZ:[id number] DOB: 07-21-1966 DOA: 12/07/2021 PCP: Neale Burly, MD  Brief Narrative: 55/M with nonischemic cardiomyopathy EF 15-20%, CKD 3a, type 2 diabetes mellitus, liver cirrhosis, PAD was admitted with decompensated heart failure complicated by low output state. -Heart failure team following, started on Lasix and milrinone, right heart cath on 2/13 noted elevated RA pressures and wedge   Subjective: -Feels better overall, breathing is improving  Assessment and Plan: * Acute on chronic systolic CHF (congestive heart failure) ([place])- (present on admission) Nonischemic cardiomyopathy, cath 9/22 with minimal CAD Echo 12/22 with EF 15-20%, moderate RV dysfunction -Heart failure team following, improving on IV Lasix gtt. and milrinone  -negative 11 L -Cardiac catheterization severe elevated right and left heart filling pressures, with low cardiac output --Entresto and Aldactone on hold with AKI, Coreg on hold with low output state -Monitor urine output, BMP in a.m  Acute renal failure superimposed on stage 3a chronic kidney disease ([place])- (present on admission) Cardiorenal syndrome  Hyponatremia-hypervolemic, -On diuretics and tolvaptan -Creatinine starting to improve -Avoid hypotension  Type 2 diabetes mellitus with hyperlipidemia ([place])- (present on admission) CBGs are stable, continue sliding scale insulin  PAD (peripheral artery disease) ([place])- (present on admission) On antiplatelet therapy Patient has lower extremity pain, in the setting of low cardiac output heart failure.  Add oxycodone as needed for pain control   Elevated TSH TSH is 6,178 Free T4 is within normal range  Hyponatremia Due to CHF/fluid overload After initial improvement, sodium now dropping again  Mixed hyperlipidemia Continue statin  Cirrhosis of liver ([place])- (present on admission) -Could be EtOH induced, quit > 2 years ago  -no clinical signs of hepatic  encephalopathy or decompensated liver failure  Pancytopenia ([place])- (present on admission) - Mild thrombocytopenia could be secondary to liver cirrhosis, monitor periodically   DVT prophylaxis: Lovenox Code Status: Full code Family Communication: Discussed with patient in detail, no family at bedside Disposition Plan:  Inpatient  Consultants:  Cardiology  Procedures:   Antimicrobials:    Objective: Vitals:   12/14/21 0600 12/14/21 0800 12/14/21 0900 12/14/21 1000  BP:  113/81 113/84 100/73  Pulse: (!) 112 (!) 105 97 93  Resp: 19 13 (!) 8 11  Temp:  98.2 F (36.8 C) 98.4 F (36.9 C) 98.4 F (36.9 C)  TempSrc:  Core    SpO2: (!) 89% 94% 93% 95%  Weight: 99.2 kg     Height:        Intake/Output Summary (Last 24 hours) at 12/14/2021 1114 Last data filed at 12/14/2021 1000 Gross per 24 hour  Intake 1677.32 ml  Output 7050 ml  Net -5372.68 ml   Filed Weights   12/12/21 0400 12/13/21 0600 12/14/21 0600  Weight: 105 kg 102.8 kg 99.2 kg    Examination:  General exam: Appears calm and comfortable sitting up in bed, no distress Respiratory system: Clear Cardiovascular system: S1 & S2 heard, RRR.  Abd: nondistended, soft and nontender.Normal bowel sounds heard. Central nervous system: Alert and oriented. No focal neurological deficits. Extremities: 2+ edema, Unna boots on Skin: No rashes Psychiatry: Judgement and insight appear normal. Mood & affect appropriate.     Data Reviewed:   CBC: Recent Labs  Lab 12/09/21 0352 12/10/21 0145 12/12/21 0015 12/12/21 1417 12/12/21 1418 12/13/21 0345 12/14/21 0430  WBC 3.7* 4.2 3.8*  --   --  3.7* 5.0  HGB 11.0* 11.8* 12.6* 13.6 13.6 11.3* 12.0*  HCT 34.6* 36.0* 38.6* 40.0  40.0 33.9* 35.7*  MCV 80.3 82.0 80.2  --   --  79.2* 77.4*  PLT 121* 129* 128*  --   --  122* 272*   Basic Metabolic Panel: Recent Labs  Lab 12/10/21 0145 12/11/21 0454 12/12/21 0015 12/12/21 0704 12/12/21 1417 12/12/21 1850 12/13/21 0345  12/13/21 0750 12/13/21 1803 12/14/21 0430  NA 127* 122* 123* 123*   < > 124* 125* 124* 125* 126*  K 3.9 4.8 4.4 4.3   < > 4.4 4.6 4.7 4.9 4.4  CL 94* 90* 92* 92*  --  91* 93* 92* 91* 92*  CO2 23 23 22 22   --  24 24 24 25 25   GLUCOSE 81 112* 115* 140*  --  141* 102* 122* 119* 97  BUN 43* 49* 47* 49*  --  50* 54* 53* 54* 51*  CREATININE 2.03* 2.81* 2.86* 2.50*  --  2.16* 2.26* 2.17* 2.04* 1.85*  CALCIUM 8.3* 8.0* 7.8* 7.8*  --  8.0* 8.1* 8.3* 8.2* 8.3*  MG 1.9 2.0 1.9 2.1  --   --   --   --   --  2.0   < > = values in this interval not displayed.   GFR: Estimated Creatinine Clearance: 54.6 mL/min (A) (by C-G formula based on SCr of 1.85 mg/dL (H)). Liver Function Tests: Recent Labs  Lab 12/12/21 0015 12/12/21 0704 12/13/21 0750  AST 29 29 31   ALT 14 13 13   ALKPHOS 58 52 50  BILITOT 0.7 0.5 0.9  PROT 7.5 6.9 7.4  ALBUMIN 2.2* 2.0* 2.2*   No results for input(s): LIPASE, AMYLASE in the last 168 hours. No results for input(s): AMMONIA in the last 168 hours. Coagulation Profile: Recent Labs  Lab 12/12/21 0015  INR 1.2   Cardiac Enzymes: No results for input(s): CKTOTAL, CKMB, CKMBINDEX, TROPONINI in the last 168 hours. BNP (last 3 results) No results for input(s): PROBNP in the last 8760 hours. HbA1C: No results for input(s): HGBA1C in the last 72 hours. CBG: Recent Labs  Lab 12/12/21 1227 12/13/21 0649 12/13/21 1112 12/13/21 1609 12/14/21 0654  GLUCAP 91 87 128* 132* 105*   Lipid Profile: No results for input(s): CHOL, HDL, LDLCALC, TRIG, CHOLHDL, LDLDIRECT in the last 72 hours. Thyroid Function Tests: Recent Labs    12/12/21 0015 12/13/21 0345  TSH 6.178*  --   FREET4  --  1.01   Anemia Panel: No results for input(s): VITAMINB12, FOLATE, FERRITIN, TIBC, IRON, RETICCTPCT in the last 72 hours. Urine analysis:    Component Value Date/Time   COLORURINE AMBER (A) 08/13/2019 1309   APPEARANCEUR CLOUDY (A) 08/13/2019 1309   LABSPEC 1.014 08/13/2019 1309    PHURINE 5.0 08/13/2019 1309   GLUCOSEU >=500 (A) 08/13/2019 1309   HGBUR LARGE (A) 08/13/2019 1309   BILIRUBINUR NEGATIVE 08/13/2019 1309   KETONESUR NEGATIVE 08/13/2019 1309   PROTEINUR 100 (A) 08/13/2019 1309   NITRITE NEGATIVE 08/13/2019 1309   LEUKOCYTESUR NEGATIVE 08/13/2019 1309   Sepsis Labs: @LABRCNTIP (procalcitonin:4,lacticidven:4)  ) Recent Results (from the past 240 hour(s))  Resp Panel by RT-PCR (Flu A&B, Covid) Nasopharyngeal Swab     Status: None   Collection Time: 12/07/21  9:08 AM   Specimen: Nasopharyngeal Swab; Nasopharyngeal(NP) swabs in vial transport medium  Result Value Ref Range Status   SARS Coronavirus 2 by RT PCR NEGATIVE NEGATIVE Final    Comment: (NOTE) SARS-CoV-2 target nucleic acids are NOT DETECTED.  The SARS-CoV-2 RNA is generally detectable in upper respiratory specimens during the acute  phase of infection. The lowest concentration of SARS-CoV-2 viral copies this assay can detect is 138 copies/mL. A negative result does not preclude SARS-Cov-2 infection and should not be used as the sole basis for treatment or other patient management decisions. A negative result may occur with  improper specimen collection/handling, submission of specimen other than nasopharyngeal swab, presence of viral mutation(s) within the areas targeted by this assay, and inadequate number of viral copies(<138 copies/mL). A negative result must be combined with clinical observations, patient history, and epidemiological information. The expected result is Negative.  Fact Sheet for Patients:  EntrepreneurPulse.com.au  Fact Sheet for Healthcare Providers:  IncredibleEmployment.be  This test is no t yet approved or cleared by the [place] FDA and  has been authorized for detection and/or diagnosis of SARS-CoV-2 by FDA under an Emergency Use Authorization (EUA). This EUA will remain  in effect (meaning this test can be used) for the  duration of the COVID-19 declaration under Section 564(b)(1) of the Act, 21 U.S.C.section 360bbb-3(b)(1), unless the authorization is terminated  or revoked sooner.       Influenza A by PCR NEGATIVE NEGATIVE Final   Influenza B by PCR NEGATIVE NEGATIVE Final    Comment: (NOTE) The Xpert Xpress SARS-CoV-2/FLU/RSV plus assay is intended as an aid in the diagnosis of influenza from Nasopharyngeal swab specimens and should not be used as a sole basis for treatment. Nasal washings and aspirates are unacceptable for Xpert Xpress SARS-CoV-2/FLU/RSV testing.  Fact Sheet for Patients: EntrepreneurPulse.com.au  Fact Sheet for Healthcare Providers: IncredibleEmployment.be  This test is not yet approved or cleared by the [place] FDA and has been authorized for detection and/or diagnosis of SARS-CoV-2 by FDA under an Emergency Use Authorization (EUA). This EUA will remain in effect (meaning this test can be used) for the duration of the COVID-19 declaration under Section 564(b)(1) of the Act, 21 U.S.C. section 360bbb-3(b)(1), unless the authorization is terminated or revoked.  Performed at [facility], [address] [place], [place] [address]   MRSA Next Gen by PCR, Nasal     Status: None   Collection Time: 12/12/21  9:35 PM   Specimen: Nasal Mucosa; Nasal Swab  Result Value Ref Range Status   MRSA by PCR Next Gen NOT DETECTED NOT DETECTED Final    Comment: (NOTE) The GeneXpert MRSA Assay (FDA approved for NASAL specimens only), is one component of a comprehensive MRSA colonization surveillance program. It is not intended to diagnose MRSA infection nor to guide or monitor treatment for MRSA infections. Test performance is not FDA approved in patients less than 21 years old. Performed at [facility], [place] [address] [place], [place] [address]      Radiology Studies: CARDIAC CATHETERIZATION  Result Date: 12/12/2021 1. Severely  elevated right and left heart filling pressures. 2. Low cardiac output by thermodilution, normal by Fick. 3. Primarily pulmonary venous hypertension. Needs extensive diuresis, will start milrinone with Lasix gtt ongoing.   DG CHEST PORT 1 VIEW  Result Date: 12/13/2021 CLINICAL DATA:  PA catheter placement. EXAM: PORTABLE CHEST 1 VIEW COMPARISON:  12/07/2021 FINDINGS: 0857 hours. The cardio pericardial silhouette is enlarged. Diffuse interstitial opacity suggests edema. Right IJ pulmonary artery catheter tip is in the main pulmonary outflow tract. No pneumothorax or pleural effusion. Telemetry leads overlie the chest. IMPRESSION: Right IJ pulmonary artery catheter tip overlies the main pulmonary outflow tract. Electronically Signed   By: Misty Stanley M.D.   On: 12/13/2021 09:14  Scheduled Meds:  aspirin EC  81 mg Oral Daily   atorvastatin  40 mg Oral QHS   Chlorhexidine Gluconate Cloth  6 each Topical Daily   enoxaparin (LOVENOX) injection  40 mg Subcutaneous Daily   insulin aspart  0-15 Units Subcutaneous TID WC   isosorbide-hydrALAZINE  0.5 tablet Oral TID   melatonin  6 mg Oral QHS   sodium chloride flush  10-40 mL Intracatheter Q12H   sodium chloride flush  3 mL Intravenous Q12H   sodium chloride flush  3 mL Intravenous Q12H   Continuous Infusions:  sodium chloride 10 mL/hr at 12/14/21 1000   furosemide (LASIX) 200 mg in dextrose 5% 100 mL (2mg /mL) infusion 20 mg/hr (12/14/21 1000)   milrinone 0.25 mcg/kg/min (12/14/21 1113)     LOS: 7 days    Time spent: 5min    Domenic Polite, MD [facility]   12/14/2021, 11:14 AM

## 2021-12-14 NOTE — TOC Benefit Eligibility Note (Addendum)
Patient Teacher, English as a foreign language completed.    The patient is currently admitted and upon discharge could be taking Bidil.  The current 30 day co-pay is $47.00  The patient is insured through [facility], [place] Patient Advocate Specialist [place] Patient Advocate Team Direct Number: [phone]  Fax: [phone]

## 2021-12-14 NOTE — Progress Notes (Addendum)
Patient ID: Edward Patricia., male   DOB: [birth year], 56 y.o.   MRN: [id number]     Advanced Heart Failure Rounding Note  PCP-Cardiologist: Rozann Lesches, MD   Subjective:    Remains on Milrinone 0.25. Co-ox 75% .  Creatinine improving 2.5>2.3>2.0>>1.85   Brisk diuresis yesterday, 7.2 L in UOP yesterday. Wt down another 8 lb. CVP 13 on my read. RN got 9 overnight. PA pressures still high. Still edematous but breathing improved.   Na still low despite tolvaptan, 125>>126   K 4.4  Mg 2.0   [place] numbers: PA 69/32/(45) CVP 13 CI 2.7 CVP 13 Co-ox 75%  PAPi 2.8    RHC Procedural Findings: Hemodynamics (mmHg) RA mean 22 RV 79/28 PA 67/32, mean 42 PCWP mean 25 Oxygen saturations: PA 68% AO 100% Cardiac Output (Fick) 5.55  Cardiac Index (Fick) 2.43 PVR 3 WU Cardiac Output (Thermo) 3.64 Cardiac Index (Thermo) 1.59  PVR 4.7 WU  Objective:   Weight Range: 99.2 kg Body mass index is 28.85 kg/m.   Vital Signs:   Temp:  [97.2 F (36.2 C)-98.4 F (36.9 C)] 97.5 F (36.4 C) (02/15 0500) Pulse Rate:  [68-112] 112 (02/15 0600) Resp:  [8-20] 19 (02/15 0600) BP: (102-153)/(53-102) 144/76 (02/15 0500) SpO2:  [89 %-100 %] 89 % (02/15 0600) Weight:  [99.2 kg] 99.2 kg (02/15 0600) Last BM Date : 12/13/21  Weight change: Filed Weights   12/12/21 0400 12/13/21 0600 12/14/21 0600  Weight: 105 kg 102.8 kg 99.2 kg    Intake/Output:   Intake/Output Summary (Last 24 hours) at 12/14/2021 0713 Last data filed at 12/14/2021 0644 Gross per 24 hour  Intake 1932.7 ml  Output 7200 ml  Net -5267.3 ml      Physical Exam    CVP 13  General:  Well appearing. No respiratory difficulty HEENT: normal Neck: supple. + Rt IJ swan JVD 10-12 cm Carotids 2+ bilat; no bruits. No lymphadenopathy or thyromegaly appreciated. Cor: PMI nondisplaced. Regular rhythm and tachy rate. No rubs, gallops or murmurs. Lungs: clear Abdomen: soft, nontender, nondistended. No hepatosplenomegaly. No  bruits or masses. Good bowel sounds. Extremities: no cyanosis, clubbing, rash, 2+ edema up to thighs, + unna boots  Neuro: alert & oriented x 3, cranial nerves grossly intact. moves all 4 extremities w/o difficulty. Affect pleasant.  Telemetry   Sinus tachy 110s  (personally reviewed)  Labs    CBC Recent Labs    12/13/21 0345 12/14/21 0430  WBC 3.7* 5.0  HGB 11.3* 12.0*  HCT 33.9* 35.7*  MCV 79.2* 77.4*  PLT 122* 250*   Basic Metabolic Panel Recent Labs    12/12/21 0704 12/12/21 1417 12/13/21 1803 12/14/21 0430  NA 123*   < > 125* 126*  K 4.3   < > 4.9 4.4  CL 92*   < > 91* 92*  CO2 22   < > 25 25  GLUCOSE 140*   < > 119* 97  BUN 49*   < > 54* 51*  CREATININE 2.50*   < > 2.04* 1.85*  CALCIUM 7.8*   < > 8.2* 8.3*  MG 2.1  --   --  2.0   < > = values in this interval not displayed.   Liver Function Tests Recent Labs    12/12/21 0704 12/13/21 0750  AST 29 31  ALT 13 13  ALKPHOS 52 50  BILITOT 0.5 0.9  PROT 6.9 7.4  ALBUMIN 2.0* 2.2*   No results for input(s): LIPASE, AMYLASE in  the last 72 hours. Cardiac Enzymes No results for input(s): CKTOTAL, CKMB, CKMBINDEX, TROPONINI in the last 72 hours.  BNP: BNP (last 3 results) Recent Labs    07/17/21 1701 12/07/21 0902  BNP 1,662.0* 1,324.0*    ProBNP (last 3 results) No results for input(s): PROBNP in the last 8760 hours.   D-Dimer No results for input(s): DDIMER in the last 72 hours. Hemoglobin A1C No results for input(s): HGBA1C in the last 72 hours. Fasting Lipid Panel No results for input(s): CHOL, HDL, LDLCALC, TRIG, CHOLHDL, LDLDIRECT in the last 72 hours. Thyroid Function Tests Recent Labs    12/12/21 0015  TSH 6.178*    Other results:   Imaging    DG CHEST PORT 1 VIEW  Result Date: 12/13/2021 CLINICAL DATA:  PA catheter placement. EXAM: PORTABLE CHEST 1 VIEW COMPARISON:  12/07/2021 FINDINGS: 0857 hours. The cardio pericardial silhouette is enlarged. Diffuse interstitial opacity  suggests edema. Right IJ pulmonary artery catheter tip is in the main pulmonary outflow tract. No pneumothorax or pleural effusion. Telemetry leads overlie the chest. IMPRESSION: Right IJ pulmonary artery catheter tip overlies the main pulmonary outflow tract. Electronically Signed   By: Misty Stanley M.D.   On: 12/13/2021 09:14     Medications:     Scheduled Medications:  aspirin EC  81 mg Oral Daily   atorvastatin  40 mg Oral QHS   Chlorhexidine Gluconate Cloth  6 each Topical Daily   enoxaparin (LOVENOX) injection  40 mg Subcutaneous Daily   insulin aspart  0-15 Units Subcutaneous TID WC   melatonin  6 mg Oral QHS   sodium chloride flush  10-40 mL Intracatheter Q12H   sodium chloride flush  3 mL Intravenous Q12H   sodium chloride flush  3 mL Intravenous Q12H    Infusions:  sodium chloride 10 mL/hr at 12/14/21 0600   furosemide (LASIX) 200 mg in dextrose 5% 100 mL (2mg /mL) infusion 20 mg/hr (12/14/21 0600)   milrinone 0.25 mcg/kg/min (12/14/21 0600)    PRN Medications: sodium chloride, acetaminophen, ondansetron (ZOFRAN) IV, oxyCODONE, sodium chloride flush, sodium chloride flush   Assessment/Plan   1. Acute on chronic systolic CHF: Nonischemic cardiomyopathy.  Cath in 9/22 at [facility] with minimal CAD.  Echo in 12/22 at [place] with EF 15-20%, moderate RV dysfunction, mild MR (prior echo in 2020 with EF 60-65%).  Cause of cardiomyopathy is uncertain, he has not drunk ETOH since 2020.  RHC on 2/13 showed elevated RA pressure and PCWP with CI 1.59 by thermodilution.  He was started on milrinone gtt 0.25.  This morning, good cardiac output and co-ox 75%, he diuresed well on Lasix gtt 20 mg/hr + tolvaptan with weight down another 8 lbs.  CVP improving, down to 13 today.  - Continue Lasix gtt 20 mg/hr, repeat BMET in pm - Will give another dose of tolvaptan today. - Continue milrinone 0.25.  - Holding Coreg with low output and marked volume overload.  - Holding Entresto, spironolactone  with AKI.  - If BP tolerates, can add Bidil in the future.  - Unna boots.  - Cardiac MRI when creatinine stabilizes and volume better controlled.  - ? Addition of sildenafil for PH  2. AKI: On CKD stage 3.  Suspect cardiorenal syndrome. Creatinine 2.26 => 2.5>2.3>2.0>>1.85 - Continue milrinone to maintain CO.  - Avoid hypotension.  3. Hyponatremia: Hypervolemic hyponatremia.  Na 126 today, 125 yesterday.  - Fluid restrict 1200 cc today.  - Add tolvaptan 30 mg x 1 today.   -  Repeat BMET in pm.  4. Cirrhosis: Thought to be due to ETOH, stopped since 2020.  5. DM2: SSI 6. PAD: 10/20 had left fem-pop bypass and iliac stenting.  - Continue ASA 81 - Continue atorvastatin 40 daily  Length of Stay: [address] PA-C  12/14/2021, 7:13 AM  Advanced Heart Failure Team Pager [phone] (M-F; 7a - 5p)  Please contact [facility] for night-coverage after hours (5p -7a ) and weekends on [email or web address]   Patient seen with PA, agree with the above note.   He is breathing better this morning, diuresed well yesterday with IV Lasix gtt and tolvaptan. Na 126 this morning, improved but still low.  Creatinine lower at 1.85.  CVP 14-15 on my read. Co-ox 75%, CI 2.7.   General: NAD Neck: JVP 12+ cm, no thyromegaly or thyroid nodule.  Lungs: Clear to auscultation bilaterally with normal respiratory effort. CV: Nondisplaced PMI.  Heart regular S1/S2, no S3/S4, no murmur.  1+ edema to thighs. Abdomen: Soft, nontender, no hepatosplenomegaly, no distention.  Skin: Intact without lesions or rashes.  Neurologic: Alert and oriented x 3.  Psych: Normal affect. Extremities: No clubbing or cyanosis.  HEENT: Normal.   Still volume overloaded, continue Lasix gtt 20 mg/hr and with ongoing hyponatremia will give another dose of tolvaptan.  Creatinine trending down.  I will add Bidil 0.5 tab tid and titrate up as able.  If creatinine continues to fall, will work on other GDMT.   Mobilize to chair.    CRITICAL CARE Performed by: Loralie Champagne  Total critical care time: 35 minutes  Critical care time was exclusive of separately billable procedures and treating other patients.  Critical care was necessary to treat or prevent imminent or life-threatening deterioration.  Critical care was time spent personally by me on the following activities: development of treatment plan with patient and/or surrogate as well as nursing, discussions with consultants, evaluation of patient's response to treatment, examination of patient, obtaining history from patient or surrogate, ordering and performing treatments and interventions, ordering and review of laboratory studies, ordering and review of radiographic studies, pulse oximetry and re-evaluation of patient's condition.  Loralie Champagne 12/14/2021 8:13 AM

## 2021-12-15 DIAGNOSIS — I5023 Acute on chronic systolic (congestive) heart failure: Secondary | ICD-10-CM | POA: Diagnosis not present

## 2021-12-15 LAB — CBC
HCT: 34 % — ABNORMAL LOW (ref 39.0–52.0)
Hemoglobin: 11.6 g/dL — ABNORMAL LOW (ref 13.0–17.0)
MCH: 26.5 pg (ref 26.0–34.0)
MCHC: 34.1 g/dL (ref 30.0–36.0)
MCV: 77.6 fL — ABNORMAL LOW (ref 80.0–100.0)
Platelets: 102 10*3/uL — ABNORMAL LOW (ref 150–400)
RBC: 4.38 MIL/uL (ref 4.22–5.81)
RDW: 15.1 % (ref 11.5–15.5)
WBC: 5.5 10*3/uL (ref 4.0–10.5)
nRBC: 0 % (ref 0.0–0.2)

## 2021-12-15 LAB — BASIC METABOLIC PANEL
Anion gap: 10 (ref 5–15)
BUN: 52 mg/dL — ABNORMAL HIGH (ref 6–20)
CO2: 27 mmol/L (ref 22–32)
Calcium: 8.7 mg/dL — ABNORMAL LOW (ref 8.9–10.3)
Chloride: 91 mmol/L — ABNORMAL LOW (ref 98–111)
Creatinine, Ser: 2.2 mg/dL — ABNORMAL HIGH (ref 0.61–1.24)
GFR, Estimated: 34 mL/min — ABNORMAL LOW (ref >60)
Glucose, Bld: 129 mg/dL — ABNORMAL HIGH (ref 70–99)
Potassium: 4.3 mmol/L (ref 3.5–5.1)
Sodium: 128 mmol/L — ABNORMAL LOW (ref 135–145)

## 2021-12-15 LAB — COOXEMETRY PANEL
Carboxyhemoglobin: 2.2 % — ABNORMAL HIGH (ref 0.5–1.5)
Methemoglobin: 0.5 % (ref 0.0–1.5)
O2 Saturation: 81.1 %
Total hemoglobin: 12.1 g/dL (ref 12.0–16.0)

## 2021-12-15 LAB — GLUCOSE, CAPILLARY
Glucose-Capillary: 117 mg/dL — ABNORMAL HIGH (ref 70–99)
Glucose-Capillary: 111 mg/dL — ABNORMAL HIGH (ref 70–99)
Glucose-Capillary: 120 mg/dL — ABNORMAL HIGH (ref 70–99)

## 2021-12-15 NOTE — Progress Notes (Signed)
PROGRESS NOTE    Edward Rocha.  ONG:[id number] DOB: Mar 22, 1966 DOA: 12/07/2021 PCP: Neale Burly, MD  Brief Narrative:55/M with nonischemic cardiomyopathy EF 15-20%, CKD 3a, type 2 diabetes mellitus, liver cirrhosis, PAD was admitted with decompensated heart failure complicated by low output state. -Heart failure team following, started on Lasix and milrinone, right heart cath on 2/13 noted elevated RA pressures and wedge   Subjective: -Feels okay overall, breathing considerably better, sitting up in the chair today   Assessment and Plan: * Acute on chronic systolic CHF (congestive heart failure) ([place])- (present on admission) Nonischemic cardiomyopathy, cath 9/22 with minimal CAD Echo 12/22 with EF 15-20%, moderate RV dysfunction -Heart failure team following, improving on IV Lasix gtt. and milrinone  -negative 16 L -Cardiac catheterization severe elevated right and left heart filling pressures, with low cardiac output --Entresto and Aldactone on hold with AKI, Coreg on hold with low output state -CVP down, bump in creatinine to 2.2, Lasix discontinued this morning, milrinone dose decreased -Monitor urine output, BMP in a.m. -Ambulate, PT eval  Acute renal failure superimposed on stage 3a chronic kidney disease ([place])- (present on admission) Cardiorenal syndrome  Hyponatremia-hypervolemic, -On diuretics and tolvaptan -Bump in creatinine as noted above, Lasix discontinued today, monitor BMP  Type 2 diabetes mellitus with hyperlipidemia ([place])- (present on admission) CBGs are stable, continue sliding scale insulin  PAD (peripheral artery disease) ([place])- (present on admission) On antiplatelet therapy Patient has lower extremity pain, in the setting of low cardiac output heart failure.  -On as needed oxycodone  Elevated TSH TSH is 6,178 Free T4 is within normal range  Hyponatremia Due to CHF/fluid overload After initial improvement, sodium now dropping again  Mixed  hyperlipidemia Continue statin  Cirrhosis of liver ([place])- (present on admission) -Could be EtOH induced, quit > 2 years ago  -no clinical signs of hepatic encephalopathy or decompensated liver failure  Pancytopenia ([employer])- (present on admission) - Mild thrombocytopenia could be secondary to liver cirrhosis, monitor periodically  DVT prophylaxis: Lovenox Code Status: Full code Family Communication: Discussed with patient in detail, no family at bedside Disposition Plan: Home in 48 hours if stable  Consultants:  Heart failure team  Procedures:   Antimicrobials:    Objective: Vitals:   12/15/21 0700 12/15/21 0800 12/15/21 0900 12/15/21 1000  BP:  105/68 109/72 (!) 120/93  Pulse: (!) 113 (!) 101 96 (!) 101  Resp: 16 18 13 15   Temp: 98.2 F (36.8 C) 98.4 F (36.9 C) 98.2 F (36.8 C) 97.7 F (36.5 C)  TempSrc:      SpO2: 95% 99% 96% 98%  Weight:      Height:        Intake/Output Summary (Last 24 hours) at 12/15/2021 1101 Last data filed at 12/15/2021 1050 Gross per 24 hour  Intake 1421.88 ml  Output 7900 ml  Net -6478.12 ml   Filed Weights   12/13/21 0600 12/14/21 0600 12/15/21 0500  Weight: 102.8 kg 99.2 kg 96.5 kg    Examination:  General exam: Chronically ill male sitting up in the recliner, AAOx3 HEENT: No JVD, right IJ Swan noted CVS: S1-S2, regular rhythm Lungs: Decreased breath sounds to bases otherwise clear Abdomen: Soft, nontender, bowel sounds present Extremities: No edema, Unna boots on Skin: No rashes Psychiatry:  Mood & affect appropriate.     Data Reviewed:   CBC: Recent Labs  Lab 12/10/21 0145 12/12/21 0015 12/12/21 1417 12/12/21 1418 12/13/21 0345 12/14/21 0430 12/15/21 0349  WBC 4.2 3.8*  --   --  3.7* 5.0 5.5  HGB 11.8* 12.6* 13.6 13.6 11.3* 12.0* 11.6*  HCT 36.0* 38.6* 40.0 40.0 33.9* 35.7* 34.0*  MCV 82.0 80.2  --   --  79.2* 77.4* 77.6*  PLT 129* 128*  --   --  122* 113* 630*   Basic Metabolic Panel: Recent Labs  Lab  12/10/21 0145 12/11/21 0454 12/12/21 0015 12/12/21 0704 12/12/21 1417 12/13/21 0345 12/13/21 0750 12/13/21 1803 12/14/21 0430 12/15/21 0349  NA 127* 122* 123* 123*   < > 125* 124* 125* 126* 128*  K 3.9 4.8 4.4 4.3   < > 4.6 4.7 4.9 4.4 4.3  CL 94* 90* 92* 92*   < > 93* 92* 91* 92* 91*  CO2 23 23 22 22    < > 24 24 25 25 27   GLUCOSE 81 112* 115* 140*   < > 102* 122* 119* 97 129*  BUN 43* 49* 47* 49*   < > 54* 53* 54* 51* 52*  CREATININE 2.03* 2.81* 2.86* 2.50*   < > 2.26* 2.17* 2.04* 1.85* 2.20*  CALCIUM 8.3* 8.0* 7.8* 7.8*   < > 8.1* 8.3* 8.2* 8.3* 8.7*  MG 1.9 2.0 1.9 2.1  --   --   --   --  2.0  --    < > = values in this interval not displayed.   GFR: Estimated Creatinine Clearance: 45.3 mL/min (A) (by C-G formula based on SCr of 2.2 mg/dL (H)). Liver Function Tests: Recent Labs  Lab 12/12/21 0015 12/12/21 0704 12/13/21 0750  AST 29 29 31   ALT 14 13 13   ALKPHOS 58 52 50  BILITOT 0.7 0.5 0.9  PROT 7.5 6.9 7.4  ALBUMIN 2.2* 2.0* 2.2*   No results for input(s): LIPASE, AMYLASE in the last 168 hours. No results for input(s): AMMONIA in the last 168 hours. Coagulation Profile: Recent Labs  Lab 12/12/21 0015  INR 1.2   Cardiac Enzymes: No results for input(s): CKTOTAL, CKMB, CKMBINDEX, TROPONINI in the last 168 hours. BNP (last 3 results) No results for input(s): PROBNP in the last 8760 hours. HbA1C: No results for input(s): HGBA1C in the last 72 hours. CBG: Recent Labs  Lab 12/14/21 0654 12/14/21 1133 12/14/21 1608 12/14/21 2132 12/15/21 0648  GLUCAP 105* 148* 169* 211* 117*   Lipid Profile: No results for input(s): CHOL, HDL, LDLCALC, TRIG, CHOLHDL, LDLDIRECT in the last 72 hours. Thyroid Function Tests: Recent Labs    12/13/21 0345  FREET4 1.01   Anemia Panel: No results for input(s): VITAMINB12, FOLATE, FERRITIN, TIBC, IRON, RETICCTPCT in the last 72 hours. Urine analysis:    Component Value Date/Time   COLORURINE AMBER (A) 08/13/2019 1309    APPEARANCEUR CLOUDY (A) 08/13/2019 1309   LABSPEC 1.014 08/13/2019 1309   PHURINE 5.0 08/13/2019 1309   GLUCOSEU >=500 (A) 08/13/2019 1309   HGBUR LARGE (A) 08/13/2019 1309   BILIRUBINUR NEGATIVE 08/13/2019 1309   KETONESUR NEGATIVE 08/13/2019 1309   PROTEINUR 100 (A) 08/13/2019 1309   NITRITE NEGATIVE 08/13/2019 1309   LEUKOCYTESUR NEGATIVE 08/13/2019 1309   Sepsis Labs: @LABRCNTIP (procalcitonin:4,lacticidven:4)  ) Recent Results (from the past 240 hour(s))  Resp Panel by RT-PCR (Flu A&B, Covid) Nasopharyngeal Swab     Status: None   Collection Time: 12/07/21  9:08 AM   Specimen: Nasopharyngeal Swab; Nasopharyngeal(NP) swabs in vial transport medium  Result Value Ref Range Status   SARS Coronavirus 2 by RT PCR NEGATIVE NEGATIVE Final    Comment: (NOTE) SARS-CoV-2 target nucleic acids are NOT DETECTED.  The SARS-CoV-2 RNA is generally detectable in upper respiratory specimens during the acute phase of infection. The lowest concentration of SARS-CoV-2 viral copies this assay can detect is 138 copies/mL. A negative result does not preclude SARS-Cov-2 infection and should not be used as the sole basis for treatment or other patient management decisions. A negative result may occur with  improper specimen collection/handling, submission of specimen other than nasopharyngeal swab, presence of viral mutation(s) within the areas targeted by this assay, and inadequate number of viral copies(<138 copies/mL). A negative result must be combined with clinical observations, patient history, and epidemiological information. The expected result is Negative.  Fact Sheet for Patients:  EntrepreneurPulse.com.au  Fact Sheet for Healthcare Providers:  IncredibleEmployment.be  This test is no t yet approved or cleared by the [place] FDA and  has been authorized for detection and/or diagnosis of SARS-CoV-2 by FDA under an Emergency Use Authorization  (EUA). This EUA will remain  in effect (meaning this test can be used) for the duration of the COVID-19 declaration under Section 564(b)(1) of the Act, 21 U.S.C.section 360bbb-3(b)(1), unless the authorization is terminated  or revoked sooner.       Influenza A by PCR NEGATIVE NEGATIVE Final   Influenza B by PCR NEGATIVE NEGATIVE Final    Comment: (NOTE) The Xpert Xpress SARS-CoV-2/FLU/RSV plus assay is intended as an aid in the diagnosis of influenza from Nasopharyngeal swab specimens and should not be used as a sole basis for treatment. Nasal washings and aspirates are unacceptable for Xpert Xpress SARS-CoV-2/FLU/RSV testing.  Fact Sheet for Patients: EntrepreneurPulse.com.au  Fact Sheet for Healthcare Providers: IncredibleEmployment.be  This test is not yet approved or cleared by the [place] FDA and has been authorized for detection and/or diagnosis of SARS-CoV-2 by FDA under an Emergency Use Authorization (EUA). This EUA will remain in effect (meaning this test can be used) for the duration of the COVID-19 declaration under Section 564(b)(1) of the Act, 21 U.S.C. section 360bbb-3(b)(1), unless the authorization is terminated or revoked.  Performed at [facility], [address] [place], [place] [address]   MRSA Next Gen by PCR, Nasal     Status: None   Collection Time: 12/12/21  9:35 PM   Specimen: Nasal Mucosa; Nasal Swab  Result Value Ref Range Status   MRSA by PCR Next Gen NOT DETECTED NOT DETECTED Final    Comment: (NOTE) The GeneXpert MRSA Assay (FDA approved for NASAL specimens only), is one component of a comprehensive MRSA colonization surveillance program. It is not intended to diagnose MRSA infection nor to guide or monitor treatment for MRSA infections. Test performance is not FDA approved in patients less than 47 years old. Performed at [facility], [place] [address] [place], [place] [address]       Radiology Studies: No results found.   Scheduled Meds:  aspirin EC  81 mg Oral Daily   atorvastatin  40 mg Oral QHS   Chlorhexidine Gluconate Cloth  6 each Topical Daily   enoxaparin (LOVENOX) injection  40 mg Subcutaneous Daily   insulin aspart  0-15 Units Subcutaneous TID WC   isosorbide-hydrALAZINE  0.5 tablet Oral TID   melatonin  6 mg Oral QHS   sodium chloride flush  10-40 mL Intracatheter Q12H   sodium chloride flush  3 mL Intravenous Q12H   sodium chloride flush  3 mL Intravenous Q12H   Continuous Infusions:  sodium chloride 10 mL/hr at 12/15/21 1000   milrinone 0.125 mcg/kg/min (12/15/21 1000)  LOS: 8 days    Time spent: 27min    Domenic Polite, MD [facility]   12/15/2021, 11:01 AM

## 2021-12-15 NOTE — Progress Notes (Addendum)
Patient ID: Edward Patricia., male   DOB: 08-02-1966, 56 y.o.   MRN: [id number]     Advanced Heart Failure Rounding Note  PCP-Cardiologist: Rozann Lesches, MD   Subjective:    Remains on Milrinone 0.25 + Lasix gtt at 20/hr.  Co-ox 81%.   Brisk diuresis again yesterday w/ 6.5L in UOP. Wt down 6 lb. CVP 6-7 but LEs remain edematous but albumin has also been low, 2.2.   SCr bumped  2.5>2.3>2.0>>1.85>>2.20. SBPs ~110s w/ Bidil addition.  K 4.3   Na 125>>126>>128 after tolvaptan.    Swan numbers: PA 45/17 (27) CO 7.44 CI 3.25 CVP 6 Co-ox 81%  PAPi 2.8   OOB, sitting up in chair eating breakfast. Appetite is good. Breathing improved   RHC Procedural Findings: Hemodynamics (mmHg) RA mean 22 RV 79/28 PA 67/32, mean 42 PCWP mean 25 Oxygen saturations: PA 68% AO 100% Cardiac Output (Fick) 5.55  Cardiac Index (Fick) 2.43 PVR 3 WU Cardiac Output (Thermo) 3.64 Cardiac Index (Thermo) 1.59  PVR 4.7 WU  Objective:   Weight Range: 96.5 kg Body mass index is 28.07 kg/m.   Vital Signs:   Temp:  [97.3 F (36.3 C)-98.8 F (37.1 C)] 98.8 F (37.1 C) (02/16 0600) Pulse Rate:  [89-106] 103 (02/16 0600) Resp:  [8-22] 16 (02/16 0600) BP: (83-145)/(60-95) 117/77 (02/16 0600) SpO2:  [92 %-100 %] 97 % (02/16 0600) Weight:  [96.5 kg] 96.5 kg (02/16 0500) Last BM Date : 12/13/21  Weight change: Filed Weights   12/13/21 0600 12/14/21 0600 12/15/21 0500  Weight: 102.8 kg 99.2 kg 96.5 kg    Intake/Output:   Intake/Output Summary (Last 24 hours) at 12/15/2021 0709 Last data filed at 12/15/2021 0600 Gross per 24 hour  Intake 1698.31 ml  Output 6500 ml  Net -4801.69 ml     PHYSICAL EXAM: CVP 6-7  General:  Well appearing. No respiratory difficulty HEENT: normal Neck: supple. + rt IJ swan, JVD not well visualized Carotids 2+ bilat; no bruits. No lymphadenopathy or thyromegaly appreciated. Cor: PMI nondisplaced. Regular rhythm tachy rate. No rubs, gallops or murmurs. Lungs:  clear Abdomen: soft, nontender, nondistended. No hepatosplenomegaly. No bruits or masses. Good bowel sounds. Extremities: no cyanosis, clubbing, rash, 2+ b/l LE edema up to knees, unna boots  Neuro: alert & oriented x 3, cranial nerves grossly intact. moves all 4 extremities w/o difficulty. Affect pleasant.   Telemetry   Sinus tachy 110s  (personally reviewed)  Labs    CBC Recent Labs    12/14/21 0430 12/15/21 0349  WBC 5.0 5.5  HGB 12.0* 11.6*  HCT 35.7* 34.0*  MCV 77.4* 77.6*  PLT 113* 749*   Basic Metabolic Panel Recent Labs    12/14/21 0430 12/15/21 0349  NA 126* 128*  K 4.4 4.3  CL 92* 91*  CO2 25 27  GLUCOSE 97 129*  BUN 51* 52*  CREATININE 1.85* 2.20*  CALCIUM 8.3* 8.7*  MG 2.0  --    Liver Function Tests Recent Labs    12/13/21 0750  AST 31  ALT 13  ALKPHOS 50  BILITOT 0.9  PROT 7.4  ALBUMIN 2.2*   No results for input(s): LIPASE, AMYLASE in the last 72 hours. Cardiac Enzymes No results for input(s): CKTOTAL, CKMB, CKMBINDEX, TROPONINI in the last 72 hours.  BNP: BNP (last 3 results) Recent Labs    07/17/21 1701 12/07/21 0902  BNP 1,662.0* 1,324.0*    ProBNP (last 3 results) No results for input(s): PROBNP in the last 8760  hours.   D-Dimer No results for input(s): DDIMER in the last 72 hours. Hemoglobin A1C No results for input(s): HGBA1C in the last 72 hours. Fasting Lipid Panel No results for input(s): CHOL, HDL, LDLCALC, TRIG, CHOLHDL, LDLDIRECT in the last 72 hours. Thyroid Function Tests No results for input(s): TSH, T4TOTAL, T3FREE, THYROIDAB in the last 72 hours.  Invalid input(s): FREET3   Other results:   Imaging    No results found.   Medications:     Scheduled Medications:  aspirin EC  81 mg Oral Daily   atorvastatin  40 mg Oral QHS   Chlorhexidine Gluconate Cloth  6 each Topical Daily   enoxaparin (LOVENOX) injection  40 mg Subcutaneous Daily   insulin aspart  0-15 Units Subcutaneous TID WC    isosorbide-hydrALAZINE  0.5 tablet Oral TID   melatonin  6 mg Oral QHS   sodium chloride flush  10-40 mL Intracatheter Q12H   sodium chloride flush  3 mL Intravenous Q12H   sodium chloride flush  3 mL Intravenous Q12H    Infusions:  sodium chloride 10 mL/hr at 12/15/21 0600   furosemide (LASIX) 200 mg in dextrose 5% 100 mL (2mg /mL) infusion 20 mg/hr (12/15/21 0600)   milrinone 0.25 mcg/kg/min (12/15/21 0600)    PRN Medications: sodium chloride, acetaminophen, ondansetron (ZOFRAN) IV, oxyCODONE, sodium chloride flush, sodium chloride flush   Assessment/Plan   1. Acute on chronic systolic CHF: Nonischemic cardiomyopathy.  Cath in 9/22 at [place] with minimal CAD.  Echo in 12/22 at [place] with EF 15-20%, moderate RV dysfunction, mild MR (prior echo in 2020 with EF 60-65%).  Cause of cardiomyopathy is uncertain, he has not drunk ETOH since 2020.  RHC on 2/13 showed elevated RA pressure and PCWP with CI 1.59 by thermodilution.  He was started on milrinone gtt 0.25.  This morning, good cardiac output and co-ox 81%, he is diuresing well on Lasix gtt 20 mg/hr + tolvaptan with weight down another 6 lbs.  CVP improving, down to 6-7 today, remains edematous but may be due to hypoalbuminemia (2.2). SCr trending back up 2.0>1.9>2.2 - Stop lasix gtt today and transition to scheduled - Will give another dose of tolvaptan today. - Wean milrinone 0.125.  - Holding Coreg with low output and marked volume overload.  - Holding Entresto, spironolactone with AKI.  - Continue Bidil 0.5 tablet tid  - Unna boots.  - Cardiac MRI when creatinine stabilizes and volume better controlled.  2. AKI: On CKD stage 3.  Suspect cardiorenal syndrome. Creatinine 2.26 => 2.5>2.3>2.0>>1.85>2.20 - w/ CVP 6-7 will Stop Lasix gtt today  - Continue milrinone to maintain CO.  - Avoid hypotension.  3. Hyponatremia: Hypervolemic hyponatremia.  Na 128 today after tolvaptan  - repeat tolvaptan today  - Fluid restrict 1200 cc today.    4. Cirrhosis: Thought to be due to ETOH, stopped since 2020.  5. DM2: SSI 6. PAD: 10/20 had left fem-pop bypass and iliac stenting. Has chronic pain in feet that he attributes to PAD.  - Continue ASA 81 - Continue atorvastatin 40 daily  Length of Stay: [address] PA-C  12/15/2021, 7:09 AM  Advanced Heart Failure Team Pager [phone] (M-F; 7a - 5p)  Please contact [facility] for night-coverage after hours (5p -7a ) and weekends on [email or web address]   Patient seen with PA, agree with the above note.   Breathing better, main complaint is foot pain.    CVP down to 5 on my measure though still with lower  extremity edema. I/Os -4774, excellent diuresis.  Creatinine up to 2.2.  Co-ox 81%.  General: NAD Neck: No JVD, no thyromegaly or thyroid nodule.  Lungs: Clear to auscultation bilaterally with normal respiratory effort. CV: Nondisplaced PMI.  Heart regular S1/S2, no S3/S4, no murmur.  1+ edema to thighs.  Abdomen: Soft, nontender, no hepatosplenomegaly, no distention.  Skin: Intact without lesions or rashes.  Neurologic: Alert and oriented x 3.  Psych: Normal affect. Extremities: No clubbing or cyanosis.  HEENT: Normal.   Creatinine up with CVP down.  Stop Lasix gtt.  Will decrease milrinone to 0.125.  Remove Swan, leave introducer to follow CVP.  Continue current Bidil 0.5 tab tid.   When creatinine stabilizes, can go for cardiac MRI.   Na up to 128.  Will continue fluid restriction, no tolvaptan with lower CVP.   Peripheral edema likely related to hypoalbuminemia and venous insufficiency at this point. Continue Unna boots.   CRITICAL CARE Performed by: Loralie Champagne  Total critical care time: 35 minutes  Critical care time was exclusive of separately billable procedures and treating other patients.  Critical care was necessary to treat or prevent imminent or life-threatening deterioration.  Critical care was time spent personally by me on the following  activities: development of treatment plan with patient and/or surrogate as well as nursing, discussions with consultants, evaluation of patient's response to treatment, examination of patient, obtaining history from patient or surrogate, ordering and performing treatments and interventions, ordering and review of laboratory studies, ordering and review of radiographic studies, pulse oximetry and re-evaluation of patient's condition.   Loralie Champagne 12/15/2021 8:15 AM

## 2021-12-16 ENCOUNTER — Other Ambulatory Visit (HOSPITAL_COMMUNITY): Payer: Self-pay

## 2021-12-16 ENCOUNTER — Inpatient Hospital Stay (HOSPITAL_COMMUNITY): Payer: Medicare Other

## 2021-12-16 DIAGNOSIS — I5023 Acute on chronic systolic (congestive) heart failure: Secondary | ICD-10-CM

## 2021-12-16 LAB — BASIC METABOLIC PANEL
Anion gap: 10 (ref 5–15)
BUN: 45 mg/dL — ABNORMAL HIGH (ref 6–20)
CO2: 27 mmol/L (ref 22–32)
Calcium: 8.8 mg/dL — ABNORMAL LOW (ref 8.9–10.3)
Chloride: 94 mmol/L — ABNORMAL LOW (ref 98–111)
Creatinine, Ser: 1.65 mg/dL — ABNORMAL HIGH (ref 0.61–1.24)
GFR, Estimated: 48 mL/min — ABNORMAL LOW (ref >60)
Glucose, Bld: 104 mg/dL — ABNORMAL HIGH (ref 70–99)
Potassium: 4 mmol/L (ref 3.5–5.1)
Sodium: 131 mmol/L — ABNORMAL LOW (ref 135–145)

## 2021-12-16 LAB — COOXEMETRY PANEL
Carboxyhemoglobin: 1.5 % (ref 0.5–1.5)
Methemoglobin: 1.1 % (ref 0.0–1.5)
O2 Saturation: 77.3 %
Total hemoglobin: 13.2 g/dL (ref 12.0–16.0)

## 2021-12-16 LAB — CBC
HCT: 36.4 % — ABNORMAL LOW (ref 39.0–52.0)
Hemoglobin: 12 g/dL — ABNORMAL LOW (ref 13.0–17.0)
MCH: 25.9 pg — ABNORMAL LOW (ref 26.0–34.0)
MCHC: 33 g/dL (ref 30.0–36.0)
MCV: 78.6 fL — ABNORMAL LOW (ref 80.0–100.0)
Platelets: 104 10*3/uL — ABNORMAL LOW (ref 150–400)
RBC: 4.63 MIL/uL (ref 4.22–5.81)
RDW: 15.1 % (ref 11.5–15.5)
WBC: 5 10*3/uL (ref 4.0–10.5)
nRBC: 0 % (ref 0.0–0.2)

## 2021-12-16 LAB — GLUCOSE, CAPILLARY
Glucose-Capillary: 114 mg/dL — ABNORMAL HIGH (ref 70–99)
Glucose-Capillary: 124 mg/dL — ABNORMAL HIGH (ref 70–99)
Glucose-Capillary: 137 mg/dL — ABNORMAL HIGH (ref 70–99)
Glucose-Capillary: 143 mg/dL — ABNORMAL HIGH (ref 70–99)

## 2021-12-16 LAB — MAGNESIUM: Magnesium: 2.1 mg/dL (ref 1.7–2.4)

## 2021-12-16 MED ORDER — IVABRADINE HCL 5 MG PO TABS
5.0000 mg | ORAL_TABLET | Freq: Two times a day (BID) | ORAL | Status: DC
  Administered 2021-12-16 – 2021-12-19 (×7): 5 mg via ORAL
  Filled 2021-12-16 (×9): qty 1

## 2021-12-16 MED ORDER — DIGOXIN 125 MCG PO TABS
0.1250 mg | ORAL_TABLET | Freq: Every day | ORAL | Status: DC
Start: 2021-12-16 — End: 2021-12-19
  Administered 2021-12-16 – 2021-12-19 (×4): 0.125 mg via ORAL
  Filled 2021-12-16 (×4): qty 1

## 2021-12-16 MED ORDER — GADOBUTROL 1 MMOL/ML IV SOLN
10.0000 mL | Freq: Once | INTRAVENOUS | Status: AC | PRN
  Administered 2021-12-16: 10 mL via INTRAVENOUS

## 2021-12-16 MED ORDER — TORSEMIDE 20 MG PO TABS
40.0000 mg | ORAL_TABLET | Freq: Every day | ORAL | Status: DC
  Administered 2021-12-16: 40 mg via ORAL
  Filled 2021-12-16: qty 2

## 2021-12-16 NOTE — TOC Benefit Eligibility Note (Signed)
Patient Teacher, English as a foreign language completed.    The patient is currently admitted and upon discharge could be taking Corlanor 5 mg tablets.  Requires Prior Authorization  The patient is insured through [place] Patient Advocate Specialist [place] Patient Advocate Team Direct Number: [phone]  Fax: [phone]

## 2021-12-16 NOTE — Progress Notes (Signed)
Patient ID: Edward Patricia., male   DOB: 27-Sep-1966, 56 y.o.   MRN: [id number]     Advanced Heart Failure Rounding Note  PCP-Cardiologist: Rozann Lesches, MD   Subjective:    He is on milrinone 0.125 with co-ox 77%.  CVP 7, now off Lasix gtt. I/Os negative yesterday. Legs remain edematous, has Unna boots.   Creatinine lower at 1.65, Na stable 131.   OOB, sitting up in chair eating breakfast. Appetite is good. Breathing improved.  Has not been walking, complains of ongoing pain in his feet.   RHC Procedural Findings: Hemodynamics (mmHg) RA mean 22 RV 79/28 PA 67/32, mean 42 PCWP mean 25 Oxygen saturations: PA 68% AO 100% Cardiac Output (Fick) 5.55  Cardiac Index (Fick) 2.43 PVR 3 WU Cardiac Output (Thermo) 3.64 Cardiac Index (Thermo) 1.59  PVR 4.7 WU  Objective:   Weight Range: 96.5 kg Body mass index is 28.07 kg/m.   Vital Signs:   Temp:  [97.3 F (36.3 C)-98.2 F (36.8 C)] 97.8 F (36.6 C) (02/17 0721) Pulse Rate:  [91-120] 99 (02/16 2100) Resp:  [10-30] 22 (02/17 0700) BP: (104-201)/(60-101) 115/91 (02/17 0600) SpO2:  [92 %-100 %] 94 % (02/17 0700) Weight:  [96.5 kg] 96.5 kg (02/17 0500) Last BM Date : 12/14/21  Weight change: Filed Weights   12/14/21 0600 12/15/21 0500 12/16/21 0500  Weight: 99.2 kg 96.5 kg 96.5 kg    Intake/Output:   Intake/Output Summary (Last 24 hours) at 12/16/2021 0827 Last data filed at 12/16/2021 0700 Gross per 24 hour  Intake 1064.2 ml  Output 2800 ml  Net -1735.8 ml     PHYSICAL EXAM: CVP 7 General: NAD Neck: No JVD, no thyromegaly or thyroid nodule.  Lungs: Clear to auscultation bilaterally with normal respiratory effort. CV: Lateral PMI.  Heart tachy, regular S1/S2, no S3/S4, no murmur.  2+ edema to knees.  Abdomen: Soft, nontender, no hepatosplenomegaly, no distention.  Skin: Intact without lesions or rashes.  Neurologic: Alert and oriented x 3.  Psych: Normal affect. Extremities: No clubbing or cyanosis.   HEENT: Normal.   Telemetry   Sinus tachy 110s  (personally reviewed)  Labs    CBC Recent Labs    12/15/21 0349 12/16/21 0318  WBC 5.5 5.0  HGB 11.6* 12.0*  HCT 34.0* 36.4*  MCV 77.6* 78.6*  PLT 102* 660*   Basic Metabolic Panel Recent Labs    12/14/21 0430 12/15/21 0349 12/16/21 0318  NA 126* 128* 131*  K 4.4 4.3 4.0  CL 92* 91* 94*  CO2 25 27 27   GLUCOSE 97 129* 104*  BUN 51* 52* 45*  CREATININE 1.85* 2.20* 1.65*  CALCIUM 8.3* 8.7* 8.8*  MG 2.0  --  2.1   Liver Function Tests No results for input(s): AST, ALT, ALKPHOS, BILITOT, PROT, ALBUMIN in the last 72 hours.  No results for input(s): LIPASE, AMYLASE in the last 72 hours. Cardiac Enzymes No results for input(s): CKTOTAL, CKMB, CKMBINDEX, TROPONINI in the last 72 hours.  BNP: BNP (last 3 results) Recent Labs    07/17/21 1701 12/07/21 0902  BNP 1,662.0* 1,324.0*    ProBNP (last 3 results) No results for input(s): PROBNP in the last 8760 hours.   D-Dimer No results for input(s): DDIMER in the last 72 hours. Hemoglobin A1C No results for input(s): HGBA1C in the last 72 hours. Fasting Lipid Panel No results for input(s): CHOL, HDL, LDLCALC, TRIG, CHOLHDL, LDLDIRECT in the last 72 hours. Thyroid Function Tests No results for input(s): TSH,  T4TOTAL, T3FREE, THYROIDAB in the last 72 hours.  Invalid input(s): FREET3   Other results:   Imaging    No results found.   Medications:     Scheduled Medications:  aspirin EC  81 mg Oral Daily   atorvastatin  40 mg Oral QHS   Chlorhexidine Gluconate Cloth  6 each Topical Daily   digoxin  0.125 mg Oral Daily   enoxaparin (LOVENOX) injection  40 mg Subcutaneous Daily   insulin aspart  0-15 Units Subcutaneous TID WC   isosorbide-hydrALAZINE  0.5 tablet Oral TID   ivabradine  5 mg Oral BID WC   melatonin  6 mg Oral QHS   sodium chloride flush  10-40 mL Intracatheter Q12H   sodium chloride flush  3 mL Intravenous Q12H   sodium chloride flush   3 mL Intravenous Q12H   torsemide  40 mg Oral Daily    Infusions:  sodium chloride 10 mL/hr at 12/16/21 0600    PRN Medications: sodium chloride, acetaminophen, ondansetron (ZOFRAN) IV, oxyCODONE, sodium chloride flush, sodium chloride flush   Assessment/Plan   1. Acute on chronic systolic CHF: Nonischemic cardiomyopathy.  Cath in 9/22 at [place] with minimal CAD.  Echo in 12/22 at [place] with EF 15-20%, moderate RV dysfunction, mild MR (prior echo in 2020 with EF 60-65%). Echo this admission with EF 20-25%, RV mild to moderately dysfunctional. Cause of cardiomyopathy is uncertain, he has not drunk ETOH since 2020.  RHC on 2/13 showed elevated RA pressure and PCWP with CI 1.59 by thermodilution.  He was started on milrinone gtt, now down to 0.125.  This morning, co-ox 77%.  CVP 7, he is off diuretics, remains edematous but may be due to hypoalbuminemia (2.2) and venous insufficiency. Creatinine lower at 1.65.  - Start torsemide 40 mg daily.  - Stop milrinone.   - Holding Coreg with low output and marked volume overload.  - Holding Entresto, spironolactone with AKI.  - Continue Bidil 0.5 tablet tid  - Add digoxin with lower creatinine.  - Add ivabradine 5 mg bid.  - Unna boots.  - Cardiac MRI when creatinine stabilizes and volume better controlled => can order today.   2. AKI: On CKD stage 3.  Suspect cardiorenal syndrome. Creatinine 2.26 => 2.5>2.3>2.0>>1.85>2.20>1.65.  - Avoid hypotension.  3. Hyponatremia: Hypervolemic hyponatremia.  Na 131.  - Keep fluid restricted.   4. Cirrhosis: Thought to be due to ETOH, stopped since 2020.  5. DM2: SSI 6. PAD: 10/20 had left fem-pop bypass and iliac stenting. Has chronic pain in feet that he attributes to PAD.  - Continue ASA 81 - Continue atorvastatin 40 daily  Can go to step down.   Length of Stay: 9  Loralie Champagne, MD  12/16/2021, 8:27 AM  Advanced Heart Failure Team Pager [phone] (M-F; 7a - 5p)  Please contact [facility] for  night-coverage after hours (5p -7a ) and weekends on [email or web address]

## 2021-12-16 NOTE — Evaluation (Signed)
Physical Therapy Evaluation Patient Details Name: Edward Rocha. MRN: [id number] DOB: January 28, 1966 Today's Date: 12/16/2021  History of Present Illness  Patient is a 56 y/o male who presents on 2/8 with SOB and fluid retention. Admitted with acute on chronic decompensated HF, AKI on CKD and hyperkalemic hyponatremia. s/p RHC on 2/13 noted elevated RA pressures and wedge. PMH includes HTN, CAD, PAD, DM, liver cirrhosis.  Clinical Impression  Patient presents with pain in bil feet, generalized weakness, impaired balance and impaired mobility s/p above. Pt lives at home with his wife and 52 year old granddaughter and reports using RW for ambulation PTA. Today, pt requires Min A for steadying self transitioning to standing and Min guard for ambulation with use of RW. HR up to 120s bpm with activity. Pt will likely progress well with increased activity. Will follow acutely to maximize independence and mobility prior to return home.       Recommendations for follow up therapy are one component of a multi-disciplinary discharge planning process, led by the attending physician.  Recommendations may be updated based on patient status, additional functional criteria and insurance authorization.  Follow Up Recommendations No PT follow up    Assistance Recommended at Discharge Intermittent Supervision/Assistance  Patient can return home with the following  A little help with walking and/or transfers;A little help with bathing/dressing/bathroom;Assistance with cooking/housework;Assist for transportation    Equipment Recommendations None recommended by PT  Recommendations for Other Services       Functional Status Assessment Patient has had a recent decline in their functional status and demonstrates the ability to make significant improvements in function in a reasonable and predictable amount of time.     Precautions / Restrictions Precautions Precautions: Fall Restrictions Weight Bearing  Restrictions: No      Mobility  Bed Mobility               General bed mobility comments: Up in chair upon PT arrival.    Transfers Overall transfer level: Needs assistance Equipment used: Rolling walker (2 wheels) Transfers: Sit to/from Stand Sit to Stand: Min assist           General transfer comment: Min A to steady in transitioning to standing with difficulty bringing UEs from arm rests to RW. Stood from Youth worker.    Ambulation/Gait Ambulation/Gait assistance: Min guard Gait Distance (Feet): 190 Feet Assistive device: Rolling walker (2 wheels) Gait Pattern/deviations: Step-through pattern, Decreased stride length, Decreased step length - right, Decreased step length - left, Trunk flexed Gait velocity: decreased     General Gait Details: SLow, mostly steady gait with decreased foot clearance bilaterally; HR in 120s throughout. Pain in bil feet  Stairs            Wheelchair Mobility    Modified Rankin (Stroke Patients Only)       Balance Overall balance assessment: Needs assistance Sitting-balance support: Feet supported, No upper extremity supported Sitting balance-Leahy Scale: Good     Standing balance support: During functional activity, Single extremity supported Standing balance-Leahy Scale: Fair Standing balance comment: Able to stand and urinate with 1 UE support                             Pertinent Vitals/Pain Pain Assessment Pain Assessment: Faces Faces Pain Scale: Hurts even more Pain Location: bil feet Pain Descriptors / Indicators: Burning, Guarding, Grimacing Pain Intervention(s): Monitored during session, Repositioned    Home Living Family/patient expects  to be discharged to:: Private residence Living Arrangements: Spouse/significant other;Other relatives (granddaughter) Available Help at Discharge: Family;Available PRN/intermittently Type of Home: House Home Access: Stairs to enter   Entrance Stairs-Number of  Steps: 1 threshold   Home Layout: One level Home Equipment: Conservation officer, nature (2 wheels);Rollator (4 wheels);Cane - single point;BSC/3in1      Prior Function Prior Level of Function : Independent/Modified Independent             Mobility Comments: Driving, welding, Mod I with use of RW ADLs Comments: independent     Hand Dominance   Dominant Hand: Right    Extremity/Trunk Assessment   Upper Extremity Assessment Upper Extremity Assessment: Defer to OT evaluation    Lower Extremity Assessment Lower Extremity Assessment: Generalized weakness (but functional) RLE Deficits / Details: burning in foot RLE Sensation: history of peripheral neuropathy LLE Deficits / Details: burning in foot LLE Sensation: history of peripheral neuropathy       Communication   Communication: No difficulties  Cognition Arousal/Alertness: Awake/alert Behavior During Therapy: WFL for tasks assessed/performed Overall Cognitive Status: Within Functional Limits for tasks assessed                                          General Comments General comments (skin integrity, edema, etc.): HR in 120s bpm throughout activity. BP stable.    Exercises     Assessment/Plan    PT Assessment Patient needs continued PT services  PT Problem List Decreased strength;Decreased mobility;Cardiopulmonary status limiting activity;Decreased balance;Impaired sensation       PT Treatment Interventions Therapeutic exercise;Gait training;Patient/family education;Therapeutic activities;Functional mobility training    PT Goals (Current goals can be found in the Care Plan section)  Acute Rehab PT Goals Patient Stated Goal: to go home PT Goal Formulation: With patient Time For Goal Achievement: 12/30/21 Potential to Achieve Goals: Good    Frequency Min 3X/week     Co-evaluation               AM-PAC PT "6 Clicks" Mobility  Outcome Measure Help needed turning from your back to your side  while in a flat bed without using bedrails?: A Little Help needed moving from lying on your back to sitting on the side of a flat bed without using bedrails?: A Little Help needed moving to and from a bed to a chair (including a wheelchair)?: A Little Help needed standing up from a chair using your arms (e.g., wheelchair or bedside chair)?: A Little Help needed to walk in hospital room?: A Little Help needed climbing 3-5 steps with a railing? : A Little 6 Click Score: 18    End of Session   Activity Tolerance: Patient tolerated treatment well Patient left: in chair;with call bell/phone within reach Nurse Communication: Mobility status PT Visit Diagnosis: Pain;Difficulty in walking, not elsewhere classified (R26.2) Pain - Right/Left:  (bil) Pain - part of body: Ankle and joints of foot    Time: 0867-6195 PT Time Calculation (min) (ACUTE ONLY): 22 min   Charges:   PT Evaluation $PT Eval Moderate Complexity: 1 Mod          Marisa Severin, PT, DPT Acute Rehabilitation Services Pager [phone] Office [phone]     Marguarite Arbour A Sabra Heck 12/16/2021, 11:53 AM

## 2021-12-16 NOTE — TOC Benefit Eligibility Note (Signed)
Patient Advocate Encounter  Prior Authorization for Corlanor 5 mg tablets has been approved.    PA# [id number] Effective dates: 12/16/2021 through 10/29/2022  Patients co-pay is $100.00.     Lyndel Safe, [place] Patient Advocate Specialist [place] Patient Advocate Team Direct Number: [phone]  Fax: [phone]

## 2021-12-16 NOTE — Progress Notes (Signed)
PROGRESS NOTE    Edward Rocha.  VWU:[id number] DOB: 1966-04-18 DOA: 12/07/2021 PCP: Neale Burly, MD  Brief Narrative:56/M with nonischemic cardiomyopathy EF 15-20%, CKD 3a, type 2 diabetes mellitus, liver cirrhosis, PAD was admitted with decompensated heart failure complicated by low output state. -Heart failure team following, started on Lasix and milrinone, right heart cath on 2/13 noted elevated RA pressures and wedge - Subjective: -Feels okay, denies any complaints, has not ambulated yet   Assessment and Plan: * Acute on chronic systolic CHF (congestive heart failure) ([place])- (present on admission) Nonischemic cardiomyopathy, cath 9/22 with minimal CAD Echo 12/22 with EF 15-20%, moderate RV dysfunction -Heart failure team following, improving on IV Lasix gtt. and milrinone  -negative 18 L -Cardiac catheterization severe elevated right and left heart filling pressures, with low cardiac output --Entresto and Aldactone on hold with AKI, Coreg on hold with low output state -CVP down, creatinine bumped to 2.2 yesterday, Lasix discontinued yesterday, milrinone stopped today -Starting torsemide -Plan for cardiac MRI -Ambulate, PT eval  Acute renal failure superimposed on stage 3a chronic kidney disease ([place])- (present on admission) Cardiorenal syndrome  Hyponatremia-hypervolemic, -On diuretics and tolvaptan -Bump in creatinine as noted above, Lasix discontinued today, monitor BMP  Type 2 diabetes mellitus with hyperlipidemia ([place])- (present on admission) CBGs are stable, continue sliding scale insulin  PAD (peripheral artery disease) ([place])- (present on admission) On antiplatelet therapy Patient has lower extremity pain, in the setting of low cardiac output heart failure.  -On as needed oxycodone  Elevated TSH TSH is 6,178 Free T4 is within normal range, recommend repeat TSH in 6 weeks  Hyponatremia- (present on admission) Due to CHF/fluid overload -Improving, also got  tolvaptan this admission  Mixed hyperlipidemia Continue statin  Cirrhosis of liver ([place])- (present on admission) -Could be EtOH induced, quit > 2 years ago  -no clinical signs of hepatic encephalopathy or decompensated liver failure  Pancytopenia ([place])- (present on admission) - Mild thrombocytopenia could be secondary to liver cirrhosis, monitor periodically  DVT prophylaxis: Lovenox Code Status: Full code Family Communication: Discussed with patient in detail, no family at bedside Disposition Plan: Home in 48 hours if stable  Consultants:  Heart failure team  Procedures:   Antimicrobials:    Objective: Vitals:   12/16/21 0800 12/16/21 0900 12/16/21 0917 12/16/21 1000  BP: 123/74 113/78  106/71  Pulse:  (!) 106 (!) 110   Resp: 19 (!) 23 (!) 25 10  Temp:      TempSrc:      SpO2: 96% 95% 98%   Weight:      Height:        Intake/Output Summary (Last 24 hours) at 12/16/2021 1113 Last data filed at 12/16/2021 1000 Gross per 24 hour  Intake 862.3 ml  Output 1850 ml  Net -987.7 ml   Filed Weights   12/14/21 0600 12/15/21 0500 12/16/21 0500  Weight: 99.2 kg 96.5 kg 96.5 kg    Examination:  General exam: Chronically ill male sitting up in the recliner, AAOx3 HEENT: No JVD, right IJ Swan noted CVS: S1-S2, regular rhythm Lungs: Decreased breath sounds to bases otherwise clear Abdomen: Soft, nontender, bowel sounds present Extremities: 1+ edema near knee, thighs Skin: No rashes Psychiatry:  Mood & affect appropriate.     Data Reviewed:   CBC: Recent Labs  Lab 12/12/21 0015 12/12/21 1417 12/12/21 1418 12/13/21 0345 12/14/21 0430 12/15/21 0349 12/16/21 0318  WBC 3.8*  --   --  3.7* 5.0 5.5 5.0  HGB  12.6*   < > 13.6 11.3* 12.0* 11.6* 12.0*  HCT 38.6*   < > 40.0 33.9* 35.7* 34.0* 36.4*  MCV 80.2  --   --  79.2* 77.4* 77.6* 78.6*  PLT 128*  --   --  122* 113* 102* 104*   < > = values in this interval not displayed.   Basic Metabolic Panel: Recent Labs   Lab 12/11/21 0454 12/12/21 0015 12/12/21 0704 12/12/21 1417 12/13/21 0750 12/13/21 1803 12/14/21 0430 12/15/21 0349 12/16/21 0318  NA 122* 123* 123*   < > 124* 125* 126* 128* 131*  K 4.8 4.4 4.3   < > 4.7 4.9 4.4 4.3 4.0  CL 90* 92* 92*   < > 92* 91* 92* 91* 94*  CO2 23 22 22    < > 24 25 25 27 27   GLUCOSE 112* 115* 140*   < > 122* 119* 97 129* 104*  BUN 49* 47* 49*   < > 53* 54* 51* 52* 45*  CREATININE 2.81* 2.86* 2.50*   < > 2.17* 2.04* 1.85* 2.20* 1.65*  CALCIUM 8.0* 7.8* 7.8*   < > 8.3* 8.2* 8.3* 8.7* 8.8*  MG 2.0 1.9 2.1  --   --   --  2.0  --  2.1   < > = values in this interval not displayed.   GFR: Estimated Creatinine Clearance: 60.4 mL/min (A) (by C-G formula based on SCr of 1.65 mg/dL (H)). Liver Function Tests: Recent Labs  Lab 12/12/21 0015 12/12/21 0704 12/13/21 0750  AST 29 29 31   ALT 14 13 13   ALKPHOS 58 52 50  BILITOT 0.7 0.5 0.9  PROT 7.5 6.9 7.4  ALBUMIN 2.2* 2.0* 2.2*   No results for input(s): LIPASE, AMYLASE in the last 168 hours. No results for input(s): AMMONIA in the last 168 hours. Coagulation Profile: Recent Labs  Lab 12/12/21 0015  INR 1.2   Cardiac Enzymes: No results for input(s): CKTOTAL, CKMB, CKMBINDEX, TROPONINI in the last 168 hours. BNP (last 3 results) No results for input(s): PROBNP in the last 8760 hours. HbA1C: No results for input(s): HGBA1C in the last 72 hours. CBG: Recent Labs  Lab 12/15/21 0648 12/15/21 1112 12/15/21 1607 12/16/21 0718 12/16/21 1110  GLUCAP 117* 111* 120* 114* 124*   Lipid Profile: No results for input(s): CHOL, HDL, LDLCALC, TRIG, CHOLHDL, LDLDIRECT in the last 72 hours. Thyroid Function Tests: No results for input(s): TSH, T4TOTAL, FREET4, T3FREE, THYROIDAB in the last 72 hours.  Anemia Panel: No results for input(s): VITAMINB12, FOLATE, FERRITIN, TIBC, IRON, RETICCTPCT in the last 72 hours. Urine analysis:    Component Value Date/Time   COLORURINE AMBER (A) 08/13/2019 1309    APPEARANCEUR CLOUDY (A) 08/13/2019 1309   LABSPEC 1.014 08/13/2019 1309   PHURINE 5.0 08/13/2019 1309   GLUCOSEU >=500 (A) 08/13/2019 1309   HGBUR LARGE (A) 08/13/2019 1309   BILIRUBINUR NEGATIVE 08/13/2019 1309   KETONESUR NEGATIVE 08/13/2019 1309   PROTEINUR 100 (A) 08/13/2019 1309   NITRITE NEGATIVE 08/13/2019 1309   LEUKOCYTESUR NEGATIVE 08/13/2019 1309   Sepsis Labs: @LABRCNTIP (procalcitonin:4,lacticidven:4)  ) Recent Results (from the past 240 hour(s))  Resp Panel by RT-PCR (Flu A&B, Covid) Nasopharyngeal Swab     Status: None   Collection Time: 12/07/21  9:08 AM   Specimen: Nasopharyngeal Swab; Nasopharyngeal(NP) swabs in vial transport medium  Result Value Ref Range Status   SARS Coronavirus 2 by RT PCR NEGATIVE NEGATIVE Final    Comment: (NOTE) SARS-CoV-2 target nucleic acids are NOT  DETECTED.  The SARS-CoV-2 RNA is generally detectable in upper respiratory specimens during the acute phase of infection. The lowest concentration of SARS-CoV-2 viral copies this assay can detect is 138 copies/mL. A negative result does not preclude SARS-Cov-2 infection and should not be used as the sole basis for treatment or other patient management decisions. A negative result may occur with  improper specimen collection/handling, submission of specimen other than nasopharyngeal swab, presence of viral mutation(s) within the areas targeted by this assay, and inadequate number of viral copies(<138 copies/mL). A negative result must be combined with clinical observations, patient history, and epidemiological information. The expected result is Negative.  Fact Sheet for Patients:  EntrepreneurPulse.com.au  Fact Sheet for Healthcare Providers:  IncredibleEmployment.be  This test is no t yet approved or cleared by the [place] FDA and  has been authorized for detection and/or diagnosis of SARS-CoV-2 by FDA under an Emergency Use Authorization  (EUA). This EUA will remain  in effect (meaning this test can be used) for the duration of the COVID-19 declaration under Section 564(b)(1) of the Act, 21 U.S.C.section 360bbb-3(b)(1), unless the authorization is terminated  or revoked sooner.       Influenza A by PCR NEGATIVE NEGATIVE Final   Influenza B by PCR NEGATIVE NEGATIVE Final    Comment: (NOTE) The Xpert Xpress SARS-CoV-2/FLU/RSV plus assay is intended as an aid in the diagnosis of influenza from Nasopharyngeal swab specimens and should not be used as a sole basis for treatment. Nasal washings and aspirates are unacceptable for Xpert Xpress SARS-CoV-2/FLU/RSV testing.  Fact Sheet for Patients: EntrepreneurPulse.com.au  Fact Sheet for Healthcare Providers: IncredibleEmployment.be  This test is not yet approved or cleared by the [place] FDA and has been authorized for detection and/or diagnosis of SARS-CoV-2 by FDA under an Emergency Use Authorization (EUA). This EUA will remain in effect (meaning this test can be used) for the duration of the COVID-19 declaration under Section 564(b)(1) of the Act, 21 U.S.C. section 360bbb-3(b)(1), unless the authorization is terminated or revoked.  Performed at [facility], [address] [place], [place] [address]   MRSA Next Gen by PCR, Nasal     Status: None   Collection Time: 12/12/21  9:35 PM   Specimen: Nasal Mucosa; Nasal Swab  Result Value Ref Range Status   MRSA by PCR Next Gen NOT DETECTED NOT DETECTED Final    Comment: (NOTE) The GeneXpert MRSA Assay (FDA approved for NASAL specimens only), is one component of a comprehensive MRSA colonization surveillance program. It is not intended to diagnose MRSA infection nor to guide or monitor treatment for MRSA infections. Test performance is not FDA approved in patients less than 25 years old. Performed at [facility], [place] [address] [place], [place] [address]       Radiology Studies: No results found.   Scheduled Meds:  aspirin EC  81 mg Oral Daily   atorvastatin  40 mg Oral QHS   Chlorhexidine Gluconate Cloth  6 each Topical Daily   digoxin  0.125 mg Oral Daily   enoxaparin (LOVENOX) injection  40 mg Subcutaneous Daily   insulin aspart  0-15 Units Subcutaneous TID WC   isosorbide-hydrALAZINE  0.5 tablet Oral TID   ivabradine  5 mg Oral BID WC   melatonin  6 mg Oral QHS   sodium chloride flush  10-40 mL Intracatheter Q12H   sodium chloride flush  3 mL Intravenous Q12H   sodium chloride flush  3 mL Intravenous Q12H  torsemide  40 mg Oral Daily   Continuous Infusions:  sodium chloride Stopped (12/16/21 0742)     LOS: 9 days    Time spent: 53min    Domenic Polite, MD [facility]   12/16/2021, 11:13 AM

## 2021-12-16 NOTE — TOC Benefit Eligibility Note (Signed)
Patient Advocate Encounter   Received notification that prior authorization for Corlanor 5 mg tablets is required.   PA submitted on 12/16/2021 Key [id number] Status is pending       Lyndel Safe, [place] Patient Advocate Specialist [place] Patient Advocate Team Direct Number: [phone]  Fax: [phone]

## 2021-12-17 LAB — CBC
HCT: 35.7 % — ABNORMAL LOW (ref 39.0–52.0)
Hemoglobin: 12 g/dL — ABNORMAL LOW (ref 13.0–17.0)
MCH: 26.4 pg (ref 26.0–34.0)
MCHC: 33.6 g/dL (ref 30.0–36.0)
MCV: 78.6 fL — ABNORMAL LOW (ref 80.0–100.0)
Platelets: 110 10*3/uL — ABNORMAL LOW (ref 150–400)
RBC: 4.54 MIL/uL (ref 4.22–5.81)
RDW: 15.1 % (ref 11.5–15.5)
WBC: 4.9 10*3/uL (ref 4.0–10.5)
nRBC: 0 % (ref 0.0–0.2)

## 2021-12-17 LAB — COOXEMETRY PANEL
Carboxyhemoglobin: 2 % — ABNORMAL HIGH (ref 0.5–1.5)
Methemoglobin: 0.7 % (ref 0.0–1.5)
O2 Saturation: 72.6 %
Total hemoglobin: 9.5 g/dL — ABNORMAL LOW (ref 12.0–16.0)

## 2021-12-17 LAB — BASIC METABOLIC PANEL
Anion gap: 10 (ref 5–15)
BUN: 41 mg/dL — ABNORMAL HIGH (ref 6–20)
CO2: 27 mmol/L (ref 22–32)
Calcium: 8.8 mg/dL — ABNORMAL LOW (ref 8.9–10.3)
Chloride: 94 mmol/L — ABNORMAL LOW (ref 98–111)
Creatinine, Ser: 1.72 mg/dL — ABNORMAL HIGH (ref 0.61–1.24)
GFR, Estimated: 46 mL/min — ABNORMAL LOW (ref >60)
Glucose, Bld: 95 mg/dL (ref 70–99)
Potassium: 3.9 mmol/L (ref 3.5–5.1)
Sodium: 131 mmol/L — ABNORMAL LOW (ref 135–145)

## 2021-12-17 LAB — GLUCOSE, CAPILLARY
Glucose-Capillary: 96 mg/dL (ref 70–99)
Glucose-Capillary: 116 mg/dL — ABNORMAL HIGH (ref 70–99)
Glucose-Capillary: 136 mg/dL — ABNORMAL HIGH (ref 70–99)

## 2021-12-17 MED ORDER — DAPAGLIFLOZIN PROPANEDIOL 10 MG PO TABS
10.0000 mg | ORAL_TABLET | Freq: Every day | ORAL | Status: DC
  Administered 2021-12-17 – 2021-12-19 (×3): 10 mg via ORAL
  Filled 2021-12-17 (×3): qty 1

## 2021-12-17 MED ORDER — OXYMETAZOLINE HCL 0.05 % NA SOLN
1.0000 | Freq: Two times a day (BID) | NASAL | Status: AC
  Filled 2021-12-17 (×2): qty 30

## 2021-12-17 MED ORDER — SPIRONOLACTONE 12.5 MG HALF TABLET
12.5000 mg | ORAL_TABLET | Freq: Every day | ORAL | Status: DC
  Administered 2021-12-17: 12.5 mg via ORAL
  Filled 2021-12-17 (×2): qty 1

## 2021-12-17 MED ORDER — TORSEMIDE 20 MG PO TABS
40.0000 mg | ORAL_TABLET | Freq: Two times a day (BID) | ORAL | Status: DC
Start: 2021-12-17 — End: 2021-12-19
  Administered 2021-12-17 – 2021-12-19 (×5): 40 mg via ORAL
  Filled 2021-12-17 (×5): qty 2

## 2021-12-17 NOTE — Progress Notes (Signed)
Patient ID: Edward Patricia., male   DOB: 1966/08/04, 56 y.o.   MRN: [id number]     Advanced Heart Failure Rounding Note  PCP-Cardiologist: Rozann Lesches, MD   Subjective:    He is off milrinone with co-ox 73%.  CVP higher at 12.  I/Os negative with po torsemide. Legs remain edematous, has Unna boots.   Creatinine fairly stable at 1.65, Na stable 131.   No dyspnea, walked yesterday.  Dangling his feet more than I like with his significant edema.   RHC Procedural Findings: Hemodynamics (mmHg) RA mean 22 RV 79/28 PA 67/32, mean 42 PCWP mean 25 Oxygen saturations: PA 68% AO 100% Cardiac Output (Fick) 5.55  Cardiac Index (Fick) 2.43 PVR 3 WU Cardiac Output (Thermo) 3.64 Cardiac Index (Thermo) 1.59  PVR 4.7 WU  Objective:   Weight Range: 96.5 kg Body mass index is 28.07 kg/m.   Vital Signs:   Temp:  [97.8 F (36.6 C)-98.2 F (36.8 C)] 98 F (36.7 C) (02/18 0000) Pulse Rate:  [79-110] 82 (02/18 0400) Resp:  [10-25] 14 (02/18 0400) BP: (106-151)/(71-104) 109/79 (02/18 0400) SpO2:  [93 %-99 %] 94 % (02/18 0400) Last BM Date : 12/15/21  Weight change: Filed Weights   12/14/21 0600 12/15/21 0500 12/16/21 0500  Weight: 99.2 kg 96.5 kg 96.5 kg    Intake/Output:   Intake/Output Summary (Last 24 hours) at 12/17/2021 0629 Last data filed at 12/17/2021 0400 Gross per 24 hour  Intake 724.53 ml  Output 2900 ml  Net -2175.47 ml     PHYSICAL EXAM: CVP 12 General: NAD Neck: JVP 10 cm, no thyromegaly or thyroid nodule.  Lungs: Clear to auscultation bilaterally with normal respiratory effort. CV: Nondisplaced PMI.  Heart regular S1/S2, no S3/S4, no murmur.  2+ edema to knees.   Abdomen: Soft, nontender, no hepatosplenomegaly, no distention.  Skin: Intact without lesions or rashes.  Neurologic: Alert and oriented x 3.  Psych: Normal affect. Extremities: No clubbing or cyanosis.  HEENT: Normal.    Telemetry   NSR 90s  (personally reviewed)  Labs    CBC Recent  Labs    12/16/21 0318 12/17/21 0422  WBC 5.0 4.9  HGB 12.0* 12.0*  HCT 36.4* 35.7*  MCV 78.6* 78.6*  PLT 104* 505*   Basic Metabolic Panel Recent Labs    12/16/21 0318 12/17/21 0422  NA 131* 131*  K 4.0 3.9  CL 94* 94*  CO2 27 27  GLUCOSE 104* 95  BUN 45* 41*  CREATININE 1.65* 1.72*  CALCIUM 8.8* 8.8*  MG 2.1  --    Liver Function Tests No results for input(s): AST, ALT, ALKPHOS, BILITOT, PROT, ALBUMIN in the last 72 hours.  No results for input(s): LIPASE, AMYLASE in the last 72 hours. Cardiac Enzymes No results for input(s): CKTOTAL, CKMB, CKMBINDEX, TROPONINI in the last 72 hours.  BNP: BNP (last 3 results) Recent Labs    07/17/21 1701 12/07/21 0902  BNP 1,662.0* 1,324.0*    ProBNP (last 3 results) No results for input(s): PROBNP in the last 8760 hours.   D-Dimer No results for input(s): DDIMER in the last 72 hours. Hemoglobin A1C No results for input(s): HGBA1C in the last 72 hours. Fasting Lipid Panel No results for input(s): CHOL, HDL, LDLCALC, TRIG, CHOLHDL, LDLDIRECT in the last 72 hours. Thyroid Function Tests No results for input(s): TSH, T4TOTAL, T3FREE, THYROIDAB in the last 72 hours.  Invalid input(s): FREET3   Other results:   Imaging    No results found.  Medications:     Scheduled Medications:  aspirin EC  81 mg Oral Daily   atorvastatin  40 mg Oral QHS   Chlorhexidine Gluconate Cloth  6 each Topical Daily   digoxin  0.125 mg Oral Daily   enoxaparin (LOVENOX) injection  40 mg Subcutaneous Daily   insulin aspart  0-15 Units Subcutaneous TID WC   isosorbide-hydrALAZINE  0.5 tablet Oral TID   ivabradine  5 mg Oral BID WC   melatonin  6 mg Oral QHS   sodium chloride flush  10-40 mL Intracatheter Q12H   sodium chloride flush  3 mL Intravenous Q12H   sodium chloride flush  3 mL Intravenous Q12H   torsemide  40 mg Oral Daily    Infusions:  sodium chloride Stopped (12/16/21 0742)    PRN Medications: sodium chloride,  acetaminophen, ondansetron (ZOFRAN) IV, oxyCODONE, sodium chloride flush, sodium chloride flush   Assessment/Plan   1. Acute on chronic systolic CHF: Nonischemic cardiomyopathy.  [facility].  Echo in 12/22 at [place] with EF 15-20%, moderate RV dysfunction, mild MR (prior echo in 2020 with EF 60-65%). Echo this admission with EF 20-25%, RV mild to moderately dysfunctional. Cause of cardiomyopathy is uncertain, he has not drunk ETOH since 2020.  RHC on 2/13 showed elevated RA pressure and PCWP with CI 1.59 by thermodilution.  He was started on milrinone gtt, now off.  This morning, co-ox 73%.  CVP 12, restarted torsemide yesterday, remains edematous but partially due to hypoalbuminemia (2.2) and venous insufficiency. Creatinine stable at 1.72.  - Increase torsemide to 40 mg bid.  If CVP does not come down with this, may need to restart IV Lasix/milrinone.   - Start Farxiga 10 mg daily.  - Start spironolactone 12.5 daily.  - Holding Coreg with low output and volume overload.  - Holding Entresto, spironolactone with AKI.  - Continue Bidil 0.5 tablet tid  - Continue digoxin.  - Continue ivabradine 5 mg bid.  - Unna boots.  - Cardiac MRI done last night, will need to be read.    2. AKI: On CKD stage 3.  Suspect cardiorenal syndrome. Creatinine 2.26 => 2.5>2.3>2.0>>1.85>2.20>1.65>1.72.  - Avoid hypotension.  3. Hyponatremia: Hypervolemic hyponatremia.  Na 131.  - Keep fluid restricted.   4. Cirrhosis: Thought to be due to ETOH, stopped since 2020.  5. DM2: SSI 6. PAD: 10/20 had left fem-pop bypass and iliac stenting. Has chronic pain in feet that he attributes to PAD.  - Continue ASA 81 - Continue atorvastatin 40 daily  Can go to step down. Walk.   Length of Stay: [place], MD  12/17/2021, 6:29 AM  Advanced Heart Failure Team Pager [phone] (M-F; 7a - 5p)  Please contact [facility] for night-coverage after hours (5p -7a ) and weekends on  [email or web address]

## 2021-12-17 NOTE — Progress Notes (Signed)
CARDIAC REHAB PHASE I   PRE:  Rate/Rhythm: 99 SR    BP: sitting 166/91    SaO2: 91 RA  MODE:  Ambulation: 170 ft   POST:  Rate/Rhythm: 120 ST    BP: sitting 202/99     SaO2: 93 RA  1230-1250 Patient ambulated in hallway x 1 assist with RW. Slow steady gait noted. Denied complaints. Post ambulation patient back to bed which was placed in chair position. Call bell and phone in reach. Will continue to follow until discharge.    Caitrin Pendergraph Minus Breeding RN, BSN

## 2021-12-17 NOTE — Progress Notes (Signed)
PROGRESS NOTE    Edward Rocha.  CVE:[id number] DOB: December 22, 1965 DOA: 12/07/2021 PCP: Neale Burly, MD  Brief Narrative:56/M with nonischemic cardiomyopathy EF 15-20%, CKD 3a, type 2 diabetes mellitus, liver cirrhosis, PAD was admitted with decompensated heart failure complicated by low output state. -Heart failure team following, started on Lasix and milrinone, right heart cath on 2/13 noted elevated RA pressures and wedge -Improving with diuresis  Subjective: -Some pain discomfort and swelling in his legs, otherwise feels okay, breathing is improving   Assessment and Plan: * Acute on chronic systolic CHF (congestive heart failure) ([place])- (present on admission) Nonischemic cardiomyopathy, cath 9/22 with minimal CAD Echo 12/22 with EF 15-20%, moderate RV dysfunction -Heart failure team following, improved on IV Lasix gtt. and milrinone  -negative 21 L -Cardiac catheterization severe elevated right and left heart filling pressures, with low cardiac output -Entresto and Aldactone on hold with AKI, Coreg on hold with low output state -Off Lasix gtt. and milrinone now, restarted torsemide yesterday, dose increased with leg/thigh swelling -Cardiac MRI completed yesterday, results pending -Ambulate, PT eval-no follow-up recommended  Acute renal failure superimposed on stage 3a chronic kidney disease ([place])- (present on admission) Cardiorenal syndrome  Hyponatremia-hypervolemic, -Improved, off Lasix gtt. and tolvaptan now -Creatinine stabilizing again, torsemide dose increased  Type 2 diabetes mellitus with hyperlipidemia ([place])- (present on admission) CBGs are stable, continue sliding scale insulin  PAD (peripheral artery disease) ([place])- (present on admission) On antiplatelet therapy Patient has lower extremity pain, in the setting of low cardiac output heart failure.  -On as needed oxycodone  Elevated TSH TSH is 6,178 Free T4 is within normal range, recommend repeat TSH in 6  weeks  Hyponatremia- (present on admission) Due to CHF/fluid overload -Improving, also got tolvaptan this admission  Mixed hyperlipidemia Continue statin  Cirrhosis of liver ([place])- (present on admission) -Could be EtOH induced, quit > 2 years ago  -no clinical signs of hepatic encephalopathy or decompensated liver failure  Pancytopenia ([place])- (present on admission) - Mild thrombocytopenia could be secondary to liver cirrhosis, monitor periodically  DVT prophylaxis: Lovenox Code Status: Full code Family Communication: Discussed with patient in detail, no family at bedside Disposition Plan: Home in 48 hours if stable  Consultants:  Heart failure team  Procedures:   Antimicrobials:    Objective: Vitals:   12/17/21 0800 12/17/21 0900 12/17/21 0930 12/17/21 1000  BP: 116/83     Pulse: 95  97 100  Resp: 18 19 16  (!) 21  Temp:      TempSrc:      SpO2: 96%  98% 94%  Weight:      Height:        Intake/Output Summary (Last 24 hours) at 12/17/2021 1100 Last data filed at 12/17/2021 1000 Gross per 24 hour  Intake 603 ml  Output 2800 ml  Net -2197 ml   Filed Weights   12/15/21 0500 12/16/21 0500 12/17/21 0500  Weight: 96.5 kg 96.5 kg 91.4 kg    Examination:  General exam: Chronically ill male sitting up in recliner, AAOx3 HEENT: Positive JVD, CVS: S1-S2, regular rhythm Lungs: Clear bilaterally Abdomen: Soft, nontender, bowel sounds present Extremities: Unna boots on, edema up to mid thigh  Skin: No rashes Psychiatry:  Mood & affect appropriate.     Data Reviewed:   CBC: Recent Labs  Lab 12/13/21 0345 12/14/21 0430 12/15/21 0349 12/16/21 0318 12/17/21 0422  WBC 3.7* 5.0 5.5 5.0 4.9  HGB 11.3* 12.0* 11.6* 12.0* 12.0*  HCT 33.9* 35.7* 34.0*  36.4* 35.7*  MCV 79.2* 77.4* 77.6* 78.6* 78.6*  PLT 122* 113* 102* 104* 381*   Basic Metabolic Panel: Recent Labs  Lab 12/11/21 0454 12/12/21 0015 12/12/21 0704 12/12/21 1417 12/13/21 1803 12/14/21 0430  12/15/21 0349 12/16/21 0318 12/17/21 0422  NA 122* 123* 123*   < > 125* 126* 128* 131* 131*  K 4.8 4.4 4.3   < > 4.9 4.4 4.3 4.0 3.9  CL 90* 92* 92*   < > 91* 92* 91* 94* 94*  CO2 23 22 22    < > 25 25 27 27 27   GLUCOSE 112* 115* 140*   < > 119* 97 129* 104* 95  BUN 49* 47* 49*   < > 54* 51* 52* 45* 41*  CREATININE 2.81* 2.86* 2.50*   < > 2.04* 1.85* 2.20* 1.65* 1.72*  CALCIUM 8.0* 7.8* 7.8*   < > 8.2* 8.3* 8.7* 8.8* 8.8*  MG 2.0 1.9 2.1  --   --  2.0  --  2.1  --    < > = values in this interval not displayed.   GFR: Estimated Creatinine Clearance: 53.6 mL/min (A) (by C-G formula based on SCr of 1.72 mg/dL (H)). Liver Function Tests: Recent Labs  Lab 12/12/21 0015 12/12/21 0704 12/13/21 0750  AST 29 29 31   ALT 14 13 13   ALKPHOS 58 52 50  BILITOT 0.7 0.5 0.9  PROT 7.5 6.9 7.4  ALBUMIN 2.2* 2.0* 2.2*   No results for input(s): LIPASE, AMYLASE in the last 168 hours. No results for input(s): AMMONIA in the last 168 hours. Coagulation Profile: Recent Labs  Lab 12/12/21 0015  INR 1.2   Cardiac Enzymes: No results for input(s): CKTOTAL, CKMB, CKMBINDEX, TROPONINI in the last 168 hours. BNP (last 3 results) No results for input(s): PROBNP in the last 8760 hours. HbA1C: No results for input(s): HGBA1C in the last 72 hours. CBG: Recent Labs  Lab 12/16/21 0718 12/16/21 1110 12/16/21 1648 12/16/21 2126 12/17/21 0643  GLUCAP 114* 124* 137* 143* 96   Lipid Profile: No results for input(s): CHOL, HDL, LDLCALC, TRIG, CHOLHDL, LDLDIRECT in the last 72 hours. Thyroid Function Tests: No results for input(s): TSH, T4TOTAL, FREET4, T3FREE, THYROIDAB in the last 72 hours.  Anemia Panel: No results for input(s): VITAMINB12, FOLATE, FERRITIN, TIBC, IRON, RETICCTPCT in the last 72 hours. Urine analysis:    Component Value Date/Time   COLORURINE AMBER (A) 08/13/2019 1309   APPEARANCEUR CLOUDY (A) 08/13/2019 1309   LABSPEC 1.014 08/13/2019 1309   PHURINE 5.0 08/13/2019 1309    GLUCOSEU >=500 (A) 08/13/2019 1309   HGBUR LARGE (A) 08/13/2019 1309   BILIRUBINUR NEGATIVE 08/13/2019 1309   KETONESUR NEGATIVE 08/13/2019 1309   PROTEINUR 100 (A) 08/13/2019 1309   NITRITE NEGATIVE 08/13/2019 1309   LEUKOCYTESUR NEGATIVE 08/13/2019 1309   Sepsis Labs: @LABRCNTIP (procalcitonin:4,lacticidven:4)  ) Recent Results (from the past 240 hour(s))  MRSA Next Gen by PCR, Nasal     Status: None   Collection Time: 12/12/21  9:35 PM   Specimen: Nasal Mucosa; Nasal Swab  Result Value Ref Range Status   MRSA by PCR Next Gen NOT DETECTED NOT DETECTED Final    Comment: (NOTE) The GeneXpert MRSA Assay (FDA approved for NASAL specimens only), is one component of a comprehensive MRSA colonization surveillance program. It is not intended to diagnose MRSA infection nor to guide or monitor treatment for MRSA infections. Test performance is not FDA approved in patients less than 32 years old. Performed at [facility]  Lab, [address], [place], [place] [address]      Radiology Studies: No results found.   Scheduled Meds:  aspirin EC  81 mg Oral Daily   atorvastatin  40 mg Oral QHS   Chlorhexidine Gluconate Cloth  6 each Topical Daily   dapagliflozin propanediol  10 mg Oral Daily   digoxin  0.125 mg Oral Daily   enoxaparin (LOVENOX) injection  40 mg Subcutaneous Daily   insulin aspart  0-15 Units Subcutaneous TID WC   isosorbide-hydrALAZINE  0.5 tablet Oral TID   ivabradine  5 mg Oral BID WC   melatonin  6 mg Oral QHS   oxymetazoline  1 spray Each Nare BID   sodium chloride flush  10-40 mL Intracatheter Q12H   sodium chloride flush  3 mL Intravenous Q12H   sodium chloride flush  3 mL Intravenous Q12H   spironolactone  12.5 mg Oral Daily   torsemide  40 mg Oral BID   Continuous Infusions:  sodium chloride Stopped (12/16/21 0742)     LOS: 10 days    Time spent: 41min    Domenic Polite, MD [facility]   12/17/2021, 11:00 AM

## 2021-12-18 LAB — GLUCOSE, CAPILLARY
Glucose-Capillary: 128 mg/dL — ABNORMAL HIGH (ref 70–99)
Glucose-Capillary: 120 mg/dL — ABNORMAL HIGH (ref 70–99)
Glucose-Capillary: 143 mg/dL — ABNORMAL HIGH (ref 70–99)
Glucose-Capillary: 195 mg/dL — ABNORMAL HIGH (ref 70–99)

## 2021-12-18 LAB — BASIC METABOLIC PANEL
Anion gap: 11 (ref 5–15)
BUN: 37 mg/dL — ABNORMAL HIGH (ref 6–20)
CO2: 28 mmol/L (ref 22–32)
Calcium: 9 mg/dL (ref 8.9–10.3)
Chloride: 93 mmol/L — ABNORMAL LOW (ref 98–111)
Creatinine, Ser: 1.71 mg/dL — ABNORMAL HIGH (ref 0.61–1.24)
GFR, Estimated: 46 mL/min — ABNORMAL LOW (ref >60)
Glucose, Bld: 101 mg/dL — ABNORMAL HIGH (ref 70–99)
Potassium: 3.7 mmol/L (ref 3.5–5.1)
Sodium: 132 mmol/L — ABNORMAL LOW (ref 135–145)

## 2021-12-18 LAB — CBC
HCT: 37.8 % — ABNORMAL LOW (ref 39.0–52.0)
Hemoglobin: 12.8 g/dL — ABNORMAL LOW (ref 13.0–17.0)
MCH: 26.2 pg (ref 26.0–34.0)
MCHC: 33.9 g/dL (ref 30.0–36.0)
MCV: 77.5 fL — ABNORMAL LOW (ref 80.0–100.0)
Platelets: 122 10*3/uL — ABNORMAL LOW (ref 150–400)
RBC: 4.88 MIL/uL (ref 4.22–5.81)
RDW: 15.3 % (ref 11.5–15.5)
WBC: 4.5 10*3/uL (ref 4.0–10.5)
nRBC: 0 % (ref 0.0–0.2)

## 2021-12-18 LAB — COOXEMETRY PANEL
Carboxyhemoglobin: 2.2 % — ABNORMAL HIGH (ref 0.5–1.5)
Methemoglobin: <0.7 % (ref 0.0–1.5)
O2 Saturation: 66.1 %
Total hemoglobin: 13.1 g/dL (ref 12.0–16.0)

## 2021-12-18 MED ORDER — ISOSORB DINITRATE-HYDRALAZINE 20-37.5 MG PO TABS
1.0000 | ORAL_TABLET | Freq: Three times a day (TID) | ORAL | Status: DC
  Administered 2021-12-18 – 2021-12-19 (×4): 1 via ORAL
  Filled 2021-12-18 (×4): qty 1

## 2021-12-18 MED ORDER — POTASSIUM CHLORIDE CRYS ER 20 MEQ PO TBCR
40.0000 meq | EXTENDED_RELEASE_TABLET | Freq: Once | ORAL | Status: AC
  Administered 2021-12-18: 40 meq via ORAL
  Filled 2021-12-18: qty 2

## 2021-12-18 MED ORDER — SPIRONOLACTONE 25 MG PO TABS
25.0000 mg | ORAL_TABLET | Freq: Every day | ORAL | Status: DC
  Administered 2021-12-18 – 2021-12-19 (×2): 25 mg via ORAL
  Filled 2021-12-18 (×2): qty 1

## 2021-12-18 NOTE — Progress Notes (Signed)
PROGRESS NOTE    Edward Rocha.  MCN:[id number] DOB: 1966/04/04 DOA: 12/07/2021 PCP: Neale Burly, MD  Brief Narrative:55/M with nonischemic cardiomyopathy EF 15-20%, CKD 3a, type 2 diabetes mellitus, liver cirrhosis, PAD was admitted with decompensated heart failure complicated by low output state. -Heart failure team following, started on Lasix and milrinone, right heart cath on 2/13 noted elevated RA pressures and wedge -Improving with diuresis  Subjective: -Feels better overall   Assessment and Plan: * Acute on chronic systolic CHF (congestive heart failure) ([place])- (present on admission) Nonischemic cardiomyopathy, cath 9/22 with minimal CAD Echo 12/22 with EF 15-20%, moderate RV dysfunction -Heart failure team following, improved on IV Lasix gtt. and milrinone  -negative 23.8 L -Cardiac catheterization severe elevated right and left heart filling pressures, with low cardiac output -Entresto and Aldactone on hold with AKI, Coreg on hold with low output state -Off Lasix gtt. and milrinone now, now on torsemide, good urine output yesterday> 3 L -Cardiac MRI completed, results pending -Ambulate, PT eval-no follow-up recommended -Discharge planning, home tomorrow if stable, will need close follow-up  Acute renal failure superimposed on stage 3a chronic kidney disease ([place])- (present on admission) Cardiorenal syndrome  Hyponatremia-hypervolemic, -Improved, off Lasix gtt. and tolvaptan now -Creatinine stable, torsemide dose increased  Type 2 diabetes mellitus with hyperlipidemia ([place])- (present on admission) CBGs are stable, continue sliding scale insulin  PAD (peripheral artery disease) ([place])- (present on admission) On antiplatelet therapy Patient has lower extremity pain, in the setting of low cardiac output heart failure.  -On as needed oxycodone  Elevated TSH TSH is 6,178 Free T4 is within normal range, recommend repeat TSH in 6 weeks  Hyponatremia- (present on  admission) Due to CHF/fluid overload -Improving, also got tolvaptan earlier this admission, now off  Mixed hyperlipidemia Continue statin  Cirrhosis of liver ([place])- (present on admission) -Could be EtOH induced, quit > 2 years ago  -no clinical signs of hepatic encephalopathy or decompensated liver failure -will send referral to gastroenterology  Pancytopenia ([place])- (present on admission) - Mild thrombocytopenia could be secondary to liver cirrhosis, monitor periodically  DVT prophylaxis: Lovenox Code Status: Full code Family Communication: Discussed with patient in detail, no family at bedside Disposition Plan: Home tomorrow  Consultants:  Heart failure team  Procedures:   Antimicrobials:    Objective: Vitals:   12/17/21 1940 12/17/21 2330 12/18/21 0442 12/18/21 0720  BP: 118/73 128/70 (!) 134/94 (!) 129/92  Pulse: 95 94 81 83  Resp: 18 18 18 19   Temp: 98 F (36.7 C) 98.4 F (36.9 C) 98.2 F (36.8 C) 98.4 F (36.9 C)  TempSrc: Oral Oral Oral Oral  SpO2: 97% 98% 100% 100%  Weight:   87.2 kg   Height:        Intake/Output Summary (Last 24 hours) at 12/18/2021 1142 Last data filed at 12/18/2021 0900 Gross per 24 hour  Intake 640 ml  Output 3400 ml  Net -2760 ml   Filed Weights   12/17/21 0500 12/17/21 1640 12/18/21 0442  Weight: 91.4 kg 90.2 kg 87.2 kg    Examination:  General exam: Chronically ill male sitting up in bed, AAOx3, no distress HEENT: No JVD CVS: S1-S2, regular rhythm Lungs: Clear bilaterally Abdomen: Soft, nontender, bowel sounds present Extremities: Unna boots on, edema improving  Skin: No rashes Psychiatry:  Mood & affect appropriate.     Data Reviewed:   CBC: Recent Labs  Lab 12/14/21 0430 12/15/21 0349 12/16/21 0318 12/17/21 0422 12/18/21 0427  WBC 5.0 5.5  5.0 4.9 4.5  HGB 12.0* 11.6* 12.0* 12.0* 12.8*  HCT 35.7* 34.0* 36.4* 35.7* 37.8*  MCV 77.4* 77.6* 78.6* 78.6* 77.5*  PLT 113* 102* 104* 110* 371*   Basic Metabolic  Panel: Recent Labs  Lab 12/12/21 0015 12/12/21 0704 12/12/21 1417 12/14/21 0430 12/15/21 0349 12/16/21 0318 12/17/21 0422 12/18/21 0427  NA 123* 123*   < > 126* 128* 131* 131* 132*  K 4.4 4.3   < > 4.4 4.3 4.0 3.9 3.7  CL 92* 92*   < > 92* 91* 94* 94* 93*  CO2 22 22   < > 25 27 27 27 28   GLUCOSE 115* 140*   < > 97 129* 104* 95 101*  BUN 47* 49*   < > 51* 52* 45* 41* 37*  CREATININE 2.86* 2.50*   < > 1.85* 2.20* 1.65* 1.72* 1.71*  CALCIUM 7.8* 7.8*   < > 8.3* 8.7* 8.8* 8.8* 9.0  MG 1.9 2.1  --  2.0  --  2.1  --   --    < > = values in this interval not displayed.   GFR: Estimated Creatinine Clearance: 53.9 mL/min (A) (by C-G formula based on SCr of 1.71 mg/dL (H)). Liver Function Tests: Recent Labs  Lab 12/12/21 0015 12/12/21 0704 12/13/21 0750  AST 29 29 31   ALT 14 13 13   ALKPHOS 58 52 50  BILITOT 0.7 0.5 0.9  PROT 7.5 6.9 7.4  ALBUMIN 2.2* 2.0* 2.2*   No results for input(s): LIPASE, AMYLASE in the last 168 hours. No results for input(s): AMMONIA in the last 168 hours. Coagulation Profile: Recent Labs  Lab 12/12/21 0015  INR 1.2   Cardiac Enzymes: No results for input(s): CKTOTAL, CKMB, CKMBINDEX, TROPONINI in the last 168 hours. BNP (last 3 results) No results for input(s): PROBNP in the last 8760 hours. HbA1C: No results for input(s): HGBA1C in the last 72 hours. CBG: Recent Labs  Lab 12/17/21 0643 12/17/21 1644 12/17/21 2113 12/18/21 0602 12/18/21 1138  GLUCAP 96 116* 136* 128* 120*   Lipid Profile: No results for input(s): CHOL, HDL, LDLCALC, TRIG, CHOLHDL, LDLDIRECT in the last 72 hours. Thyroid Function Tests: No results for input(s): TSH, T4TOTAL, FREET4, T3FREE, THYROIDAB in the last 72 hours.  Anemia Panel: No results for input(s): VITAMINB12, FOLATE, FERRITIN, TIBC, IRON, RETICCTPCT in the last 72 hours. Urine analysis:    Component Value Date/Time   COLORURINE AMBER (A) 08/13/2019 1309   APPEARANCEUR CLOUDY (A) 08/13/2019 1309    LABSPEC 1.014 08/13/2019 1309   PHURINE 5.0 08/13/2019 1309   GLUCOSEU >=500 (A) 08/13/2019 1309   HGBUR LARGE (A) 08/13/2019 1309   BILIRUBINUR NEGATIVE 08/13/2019 1309   KETONESUR NEGATIVE 08/13/2019 1309   PROTEINUR 100 (A) 08/13/2019 1309   NITRITE NEGATIVE 08/13/2019 1309   LEUKOCYTESUR NEGATIVE 08/13/2019 1309   Sepsis Labs: @LABRCNTIP (procalcitonin:4,lacticidven:4)  ) Recent Results (from the past 240 hour(s))  MRSA Next Gen by PCR, Nasal     Status: None   Collection Time: 12/12/21  9:35 PM   Specimen: Nasal Mucosa; Nasal Swab  Result Value Ref Range Status   MRSA by PCR Next Gen NOT DETECTED NOT DETECTED Final    Comment: (NOTE) The GeneXpert MRSA Assay (FDA approved for NASAL specimens only), is one component of a comprehensive MRSA colonization surveillance program. It is not intended to diagnose MRSA infection nor to guide or monitor treatment for MRSA infections. Test performance is not FDA approved in patients less than 25 years old. Performed  at [facility], [place] [address] [place], [place] [address]      Radiology Studies: No results found.   Scheduled Meds:  aspirin EC  81 mg Oral Daily   atorvastatin  40 mg Oral QHS   Chlorhexidine Gluconate Cloth  6 each Topical Daily   dapagliflozin propanediol  10 mg Oral Daily   digoxin  0.125 mg Oral Daily   enoxaparin (LOVENOX) injection  40 mg Subcutaneous Daily   insulin aspart  0-15 Units Subcutaneous TID WC   isosorbide-hydrALAZINE  1 tablet Oral TID   ivabradine  5 mg Oral BID WC   melatonin  6 mg Oral QHS   potassium chloride  40 mEq Oral Once   sodium chloride flush  10-40 mL Intracatheter Q12H   sodium chloride flush  3 mL Intravenous Q12H   spironolactone  25 mg Oral Daily   torsemide  40 mg Oral BID   Continuous Infusions:  sodium chloride Stopped (12/16/21 0742)     LOS: 11 days    Time spent: 43min  Domenic Polite, MD [facility]   12/18/2021, 11:42 AM

## 2021-12-18 NOTE — Progress Notes (Signed)
Patient ID: Edward Patricia., male   DOB: April 12, 1966, 56 y.o.   MRN: [id number]     Advanced Heart Failure Rounding Note  PCP-Cardiologist: Rozann Lesches, MD   Subjective:    He is off milrinone with co-ox 66%.  CVP 13.  I/Os negative with po torsemide and weight continues to trend down. Legs remain edematous, has Unna boots.   Creatinine fairly stable at 1.72 => 1.71, Na stable 132.   No dyspnea, walked yesterday.  Dangling his feet more than I like with his significant edema.   RHC Procedural Findings: Hemodynamics (mmHg) RA mean 22 RV 79/28 PA 67/32, mean 42 PCWP mean 25 Oxygen saturations: PA 68% AO 100% Cardiac Output (Fick) 5.55  Cardiac Index (Fick) 2.43 PVR 3 WU Cardiac Output (Thermo) 3.64 Cardiac Index (Thermo) 1.59  PVR 4.7 WU  Objective:   Weight Range: 87.2 kg Body mass index is 25.37 kg/m.   Vital Signs:   Temp:  [97.6 F (36.4 C)-98.4 F (36.9 C)] 98.4 F (36.9 C) (02/19 0720) Pulse Rate:  [81-97] 83 (02/19 0720) Resp:  [14-19] 19 (02/19 0720) BP: (105-134)/(70-94) 129/92 (02/19 0720) SpO2:  [95 %-100 %] 100 % (02/19 0720) Weight:  [87.2 kg-90.2 kg] 87.2 kg (02/19 0442) Last BM Date : 12/17/21  Weight change: Filed Weights   12/17/21 0500 12/17/21 1640 12/18/21 0442  Weight: 91.4 kg 90.2 kg 87.2 kg    Intake/Output:   Intake/Output Summary (Last 24 hours) at 12/18/2021 1013 Last data filed at 12/18/2021 0900 Gross per 24 hour  Intake 640 ml  Output 3400 ml  Net -2760 ml     PHYSICAL EXAM: CVP 13 General: NAD Neck: JVP 10 cm, no thyromegaly or thyroid nodule.  Lungs: Clear to auscultation bilaterally with normal respiratory effort. CV: Nondisplaced PMI.  Heart regular S1/S2, no S3/S4, no murmur.  2+ edema to knees.  Abdomen: Soft, nontender, no hepatosplenomegaly, no distention.  Skin: Intact without lesions or rashes.  Neurologic: Alert and oriented x 3.  Psych: Normal affect. Extremities: No clubbing or cyanosis.  HEENT:  Normal.     Telemetry   NSR 90s  (personally reviewed)  Labs    CBC Recent Labs    12/17/21 0422 12/18/21 0427  WBC 4.9 4.5  HGB 12.0* 12.8*  HCT 35.7* 37.8*  MCV 78.6* 77.5*  PLT 110* 952*   Basic Metabolic Panel Recent Labs    12/16/21 0318 12/17/21 0422 12/18/21 0427  NA 131* 131* 132*  K 4.0 3.9 3.7  CL 94* 94* 93*  CO2 27 27 28   GLUCOSE 104* 95 101*  BUN 45* 41* 37*  CREATININE 1.65* 1.72* 1.71*  CALCIUM 8.8* 8.8* 9.0  MG 2.1  --   --    Liver Function Tests No results for input(s): AST, ALT, ALKPHOS, BILITOT, PROT, ALBUMIN in the last 72 hours.  No results for input(s): LIPASE, AMYLASE in the last 72 hours. Cardiac Enzymes No results for input(s): CKTOTAL, CKMB, CKMBINDEX, TROPONINI in the last 72 hours.  BNP: BNP (last 3 results) Recent Labs    07/17/21 1701 12/07/21 0902  BNP 1,662.0* 1,324.0*    ProBNP (last 3 results) No results for input(s): PROBNP in the last 8760 hours.   D-Dimer No results for input(s): DDIMER in the last 72 hours. Hemoglobin A1C No results for input(s): HGBA1C in the last 72 hours. Fasting Lipid Panel No results for input(s): CHOL, HDL, LDLCALC, TRIG, CHOLHDL, LDLDIRECT in the last 72 hours. Thyroid Function Tests No results  for input(s): TSH, T4TOTAL, T3FREE, THYROIDAB in the last 72 hours.  Invalid input(s): FREET3   Other results:   Imaging    No results found.   Medications:     Scheduled Medications:  aspirin EC  81 mg Oral Daily   atorvastatin  40 mg Oral QHS   Chlorhexidine Gluconate Cloth  6 each Topical Daily   dapagliflozin propanediol  10 mg Oral Daily   digoxin  0.125 mg Oral Daily   enoxaparin (LOVENOX) injection  40 mg Subcutaneous Daily   insulin aspart  0-15 Units Subcutaneous TID WC   isosorbide-hydrALAZINE  1 tablet Oral TID   ivabradine  5 mg Oral BID WC   melatonin  6 mg Oral QHS   sodium chloride flush  10-40 mL Intracatheter Q12H   sodium chloride flush  3 mL Intravenous  Q12H   spironolactone  25 mg Oral Daily   torsemide  40 mg Oral BID    Infusions:  sodium chloride Stopped (12/16/21 0742)    PRN Medications: sodium chloride, acetaminophen, ondansetron (ZOFRAN) IV, oxyCODONE, sodium chloride flush   Assessment/Plan   1. Acute on chronic systolic CHF: Nonischemic cardiomyopathy.  Cath in 9/22 at [place] with minimal CAD.  Echo in 12/22 at [place] with EF 15-20%, moderate RV dysfunction, mild MR (prior echo in 2020 with EF 60-65%). Echo this admission with EF 20-25%, RV mild to moderately dysfunctional. Cause of cardiomyopathy is uncertain, he has not drunk ETOH since 2020.  RHC on 2/13 showed elevated RA pressure and PCWP with CI 1.59 by thermodilution.  He was started on milrinone gtt, now off.  This morning, co-ox 66%.  CVP 13 but diuresing well with weight coming down on po torsemide, remains edematous but partially due to hypoalbuminemia (2.2) and venous insufficiency. Creatinine stable at 1.71.  - Continue torsemide 40 mg bid.     - Continue Farxiga 10 mg daily.  - Increase spironolactone to 25 mg daily.  - Holding Coreg with low output and volume overload.  - Holding Entresto with AKI.  - Increase Bidil to 1 tablet tid  - Continue digoxin.  - Continue ivabradine 5 mg bid.  - Unna boots.  - Cardiac MRI done, will read.   - Will need to follow carefully for advanced therapies in the future.  Renal function will be a concern.  2. AKI: On CKD stage 3.  Suspect cardiorenal syndrome. Creatinine 2.26 => 2.5>2.3>2.0>>1.85>2.20>1.65>1.72>1.71.  - Avoid hypotension.  3. Hyponatremia: Hypervolemic hyponatremia.  Na 132.  - Keep fluid restricted.   4. Cirrhosis: Thought to be due to ETOH, stopped since 2020.  5. DM2: SSI 6. PAD: 10/20 had left fem-pop bypass and iliac stenting. Has chronic pain in feet that he attributes to PAD.  - Continue ASA 81 - Continue atorvastatin 40 daily  Continue to ambulate.  If continues to diurese well and weight coming  down, can probably go home tomorrow.   Length of Stay: [place], MD  12/18/2021, 10:13 AM  Advanced Heart Failure Team Pager [phone] (M-F; 7a - 5p)  Please contact [facility] for night-coverage after hours (5p -7a ) and weekends on [email or web address]

## 2021-12-18 NOTE — Plan of Care (Signed)
  Problem: Education: Goal: Knowledge of General Education information will improve Description: Including pain rating scale, medication(s)/side effects and non-pharmacologic comfort measures Outcome: Progressing   Problem: Health Behavior/Discharge Planning: Goal: Ability to manage health-related needs will improve Outcome: Progressing   Problem: Clinical Measurements: Goal: Diagnostic test results will improve Outcome: Progressing   

## 2021-12-19 ENCOUNTER — Other Ambulatory Visit (HOSPITAL_COMMUNITY): Payer: Self-pay

## 2021-12-19 LAB — BASIC METABOLIC PANEL
Anion gap: 8 (ref 5–15)
BUN: 36 mg/dL — ABNORMAL HIGH (ref 6–20)
CO2: 28 mmol/L (ref 22–32)
Calcium: 8.6 mg/dL — ABNORMAL LOW (ref 8.9–10.3)
Chloride: 98 mmol/L (ref 98–111)
Creatinine, Ser: 1.78 mg/dL — ABNORMAL HIGH (ref 0.61–1.24)
GFR, Estimated: 44 mL/min — ABNORMAL LOW (ref >60)
Glucose, Bld: 96 mg/dL (ref 70–99)
Potassium: 3.9 mmol/L (ref 3.5–5.1)
Sodium: 134 mmol/L — ABNORMAL LOW (ref 135–145)

## 2021-12-19 LAB — COOXEMETRY PANEL
Carboxyhemoglobin: 2 % — ABNORMAL HIGH (ref 0.5–1.5)
Methemoglobin: 0.8 % (ref 0.0–1.5)
O2 Saturation: 80.5 %
Total hemoglobin: 11.6 g/dL — ABNORMAL LOW (ref 12.0–16.0)

## 2021-12-19 LAB — CBC
HCT: 34.4 % — ABNORMAL LOW (ref 39.0–52.0)
Hemoglobin: 11.6 g/dL — ABNORMAL LOW (ref 13.0–17.0)
MCH: 26.4 pg (ref 26.0–34.0)
MCHC: 33.7 g/dL (ref 30.0–36.0)
MCV: 78.2 fL — ABNORMAL LOW (ref 80.0–100.0)
Platelets: 115 10*3/uL — ABNORMAL LOW (ref 150–400)
RBC: 4.4 MIL/uL (ref 4.22–5.81)
RDW: 15.5 % (ref 11.5–15.5)
WBC: 4.2 10*3/uL (ref 4.0–10.5)
nRBC: 0 % (ref 0.0–0.2)

## 2021-12-19 LAB — GLUCOSE, CAPILLARY
Glucose-Capillary: 110 mg/dL — ABNORMAL HIGH (ref 70–99)
Glucose-Capillary: 111 mg/dL — ABNORMAL HIGH (ref 70–99)
Glucose-Capillary: 109 mg/dL — ABNORMAL HIGH (ref 70–99)

## 2021-12-19 MED ORDER — ACETAMINOPHEN 500 MG PO TABS: 500.0000 mg | ORAL_TABLET | Freq: Every day | ORAL | 0 refills | Status: DC | PRN

## 2021-12-19 MED ORDER — SPIRONOLACTONE 25 MG PO TABS
25.0000 mg | ORAL_TABLET | Freq: Every day | ORAL | 6 refills | Status: DC
  Filled 2021-12-19: qty 30, 30d supply, fill #0

## 2021-12-19 MED ORDER — SACUBITRIL-VALSARTAN 24-26 MG PO TABS
1.0000 | ORAL_TABLET | Freq: Two times a day (BID) | ORAL | Status: DC
  Administered 2021-12-19: 1 via ORAL
  Filled 2021-12-19: qty 1

## 2021-12-19 MED ORDER — SACUBITRIL-VALSARTAN 24-26 MG PO TABS
1.0000 | ORAL_TABLET | Freq: Two times a day (BID) | ORAL | 6 refills | Status: DC
  Filled 2021-12-19: qty 60, 30d supply, fill #0

## 2021-12-19 MED ORDER — ISOSORB DINITRATE-HYDRALAZINE 20-37.5 MG PO TABS
1.0000 | ORAL_TABLET | Freq: Three times a day (TID) | ORAL | 6 refills | Status: DC
  Filled 2021-12-19: qty 90, 30d supply, fill #0

## 2021-12-19 MED ORDER — GABAPENTIN 300 MG PO CAPS: 600.0000 mg | ORAL_CAPSULE | Freq: Every day | ORAL | Status: DC

## 2021-12-19 MED ORDER — DAPAGLIFLOZIN PROPANEDIOL 10 MG PO TABS
10.0000 mg | ORAL_TABLET | Freq: Every day | ORAL | 6 refills | Status: DC
  Filled 2021-12-19: qty 30, 30d supply, fill #0

## 2021-12-19 MED ORDER — IVABRADINE HCL 5 MG PO TABS
5.0000 mg | ORAL_TABLET | Freq: Two times a day (BID) | ORAL | 6 refills | Status: DC
  Filled 2021-12-19: qty 60, 30d supply, fill #0

## 2021-12-19 MED ORDER — TORSEMIDE 20 MG PO TABS
40.0000 mg | ORAL_TABLET | Freq: Two times a day (BID) | ORAL | 6 refills | Status: DC
  Filled 2021-12-19: qty 120, 30d supply, fill #0

## 2021-12-19 MED ORDER — DIGOXIN 125 MCG PO TABS
0.1250 mg | ORAL_TABLET | Freq: Every day | ORAL | 6 refills | Status: DC
  Filled 2021-12-19: qty 30, 30d supply, fill #0

## 2021-12-19 NOTE — Progress Notes (Signed)
CARDIAC REHAB PHASE I   Reinforced importance of daily weights and monitoring salt intake. Offered HF booklet, pt states he received one last hospital stay and still has it. Ambulating without difficulty. Encouraged continued ambulation. Will refer to [facility] [place].  [id number] Rufina Falco, RN BSN 12/19/2021 10:38 AM

## 2021-12-19 NOTE — Progress Notes (Signed)
Mobility Specialist Progress Note:   12/19/21 1000  Mobility  Activity Ambulated with assistance to bathroom  Level of Assistance Standby assist, set-up cues, supervision of patient - no hands on  Assistive Device Front wheel walker  Distance Ambulated (ft) 30 ft  Activity Response Tolerated well  $Mobility charge 1 Mobility   During Mobility: HR 135bpm  Pt received needing to use BR. Ambulated with RW at supervision level to and from [place]. Pt left sitting EOB with all needs met, eager for d/c.   Nelta Numbers Acute Rehab Phone: [phone] Office Phone: [phone]

## 2021-12-19 NOTE — Progress Notes (Signed)
Physical Therapy Treatment Patient Details Name: Edward Rocha. MRN: [id number] DOB: May 06, 1966 Today's Date: 12/19/2021   History of Present Illness Patient is a 56 y/o male who presents on 2/8 with SOB and fluid retention. Admitted with acute on chronic decompensated HF, AKI on CKD and hyperkalemic hyponatremia. s/p RHC on 2/13 noted elevated RA pressures and wedge. PMH includes HTN, CAD, PAD, DM, liver cirrhosis.    PT Comments    Pt received seated EOB, agreeable to therapy session with emphasis on seated/standing exercise program review. Pt recently worked with Risk analyst and deferred gait progression in hallway due to impending DC but agreeable to HEP instruction (link: [email or web address] Access Code: [email or web address]) and performed all tasks with Supervision/cues for technique. Reviewed safe stair sequencing as pt reports small porch step to enter home, anticipate no further PT needs once pt medically cleared for DC.  Recommendations for follow up therapy are one component of a multi-disciplinary discharge planning process, led by the attending physician.  Recommendations may be updated based on patient status, additional functional criteria and insurance authorization.  Follow Up Recommendations  No PT follow up     Assistance Recommended at Discharge Intermittent Supervision/Assistance  Patient can return home with the following Assistance with cooking/housework;Assist for transportation   Equipment Recommendations  None recommended by PT    Recommendations for Other Services       Precautions / Restrictions Precautions Precautions: Fall Restrictions Weight Bearing Restrictions: No     Mobility  Bed Mobility               General bed mobility comments: Up at EOB upon PTA arrival.    Transfers Overall transfer level: Needs assistance Equipment used: Rolling walker (2 wheels) Transfers: Sit to/from Stand Sit to Stand: Supervision           General  transfer comment: from EOB, pt able to stand with arms crossed with increased effort, but stands with more ease with BUE support pushing from bed    Ambulation/Gait Ambulation/Gait assistance: Supervision Gait Distance (Feet): 5 Feet Assistive device: Rolling walker (2 wheels)         General Gait Details: pt defer hallway mobility due to upcoming DC and had recently worked with Risk analyst. Pt stepping at bedside during exercises only this session, no LOB or difficulty managing RW      Balance Overall balance assessment: Needs assistance Sitting-balance support: Feet supported, No upper extremity supported Sitting balance-Leahy Scale: Normal     Standing balance support: During functional activity, Single extremity supported Standing balance-Leahy Scale: Fair Standing balance comment: pt able to static stand with 0-1 UE support no LOB, RW for dynamic tasks                            Cognition Arousal/Alertness: Awake/alert Behavior During Therapy: WFL for tasks assessed/performed Overall Cognitive Status: Within Functional Limits for tasks assessed                                          Exercises Other Exercises Other Exercises: seated BLE AROM: LAQ x5 reps ea Other Exercises: standing BLE AROM: mini squats, hamstring curls x5-10 reps ea Other Exercises: STS x3 reps (some with arms crossed) Other Exercises: HEP link: [email or web address] Access Code: [email or web address]    General Comments General comments (skin integrity, edema, etc.): VSS per  monitor, no acute s/sx distress      Pertinent Vitals/Pain Pain Assessment Pain Assessment: No/denies pain Pain Intervention(s): Monitored during session     PT Goals (current goals can now be found in the care plan section) Acute Rehab PT Goals Patient Stated Goal: to go home PT Goal Formulation: With patient Time For Goal Achievement: 12/30/21 Progress towards PT goals: Progressing toward  goals    Frequency    Min 3X/week      PT Plan Current plan remains appropriate       AM-PAC PT "6 Clicks" Mobility   Outcome Measure  Help needed turning from your back to your side while in a flat bed without using bedrails?: None Help needed moving from lying on your back to sitting on the side of a flat bed without using bedrails?: None Help needed moving to and from a bed to a chair (including a wheelchair)?: None Help needed standing up from a chair using your arms (e.g., wheelchair or bedside chair)?: A Little Help needed to walk in hospital room?: A Little Help needed climbing 3-5 steps with a railing? : A Little 6 Click Score: 21    End of Session   Activity Tolerance: Patient tolerated treatment well Patient left: in bed;with call bell/phone within reach;Other (comment) (pt seated EOB awaiting RN) Nurse Communication: Mobility status PT Visit Diagnosis: Pain;Difficulty in walking, not elsewhere classified (R26.2)     Time: 9444-6190 PT Time Calculation (min) (ACUTE ONLY): 15 min  Charges:  $Therapeutic Exercise: 8-22 mins                     Pearle Wandler P., PTA Acute Rehabilitation Services Pager: [phone] Office: [place] 12/19/2021, 1:12 PM

## 2021-12-19 NOTE — Progress Notes (Signed)
SATURATION QUALIFICATIONS: (This note is used to comply with regulatory documentation for home oxygen)  Patient Saturations on Room Air at Rest = 95%  Patient Saturations on Room Air while Ambulating = 94%  Patient Saturations on N/A Liters of oxygen while Ambulating = N/A%  Please briefly explain why patient needs home oxygen: N/A  Nelta Numbers Acute Rehab Phone: [phone] Office Phone: [phone]

## 2021-12-19 NOTE — Care Management Important Message (Signed)
Important Message  Patient Details  Name: Edward Rocha. MRN: [id number] Date of Birth: Nov 26, 1965   Medicare Important Message Given:  Yes     [name] 12/19/2021, 8:28 AM

## 2021-12-19 NOTE — TOC CM/SW Note (Signed)
HF TOC CM spoke to pt at bedside and no home oxygen needed. No other TOC CM needs identified. [place], Heart Failure TOC CM [phone]

## 2021-12-19 NOTE — Progress Notes (Signed)
Mobility Specialist Progress Note:   12/19/21 1045  Mobility  Activity Ambulated with assistance in hallway  Level of Assistance Standby assist, set-up cues, supervision of patient - no hands on  Assistive Device Front wheel walker  Distance Ambulated (ft) 300 ft  Activity Response Tolerated well  $Mobility charge 1 Mobility    Pre Mobility: SpO2 95% RA During Mobility: SpO2 94% RA  Pt agreeable to hallway ambulation at this time. No SOB noted, pt SpO2 >90% throughout on RA. Pt back sitting EOB with all needs met.   Nelta Numbers Acute Rehab Phone: [phone] Office Phone: [phone]

## 2021-12-19 NOTE — Progress Notes (Signed)
Patient discharged to home. IV and telemetry removed. Discharge instructions explained and given to patient. ?

## 2021-12-19 NOTE — Progress Notes (Addendum)
Patient ID: Edward Patricia., male   DOB: [birth year], 56 y.o.   MRN: [id number]     Advanced Heart Failure Rounding Note  PCP-Cardiologist: Rozann Lesches, MD   Subjective:    Wants to go home. Denies SOB.   CO-OX stable 80.5%   Creatinine fairly stable at 1.72 => 1.78, Na stable 134  RHC Procedural Findings: Hemodynamics (mmHg) RA mean 22 RV 79/28 PA 67/32, mean 42 PCWP mean 25 Oxygen saturations: PA 68% AO 100% Cardiac Output (Fick) 5.55  Cardiac Index (Fick) 2.43 PVR 3 WU Cardiac Output (Thermo) 3.64 Cardiac Index (Thermo) 1.59  PVR 4.7 WU  Objective:   Weight Range: 86.8 kg Body mass index is 25.25 kg/m.   Vital Signs:   Temp:  [98.1 F (36.7 C)-98.6 F (37 C)] 98.1 F (36.7 C) (02/20 0453) Pulse Rate:  [72-93] 86 (02/20 0453) Resp:  [11-23] 17 (02/20 0453) BP: (102-114)/(58-70) 114/70 (02/20 0453) SpO2:  [97 %-100 %] 100 % (02/20 0453) Weight:  [86.8 kg] 86.8 kg (02/20 0135) Last BM Date : 12/17/21  Weight change: Filed Weights   12/17/21 1640 12/18/21 0442 12/19/21 0135  Weight: 90.2 kg 87.2 kg 86.8 kg    Intake/Output:   Intake/Output Summary (Last 24 hours) at 12/19/2021 0723 Last data filed at 12/19/2021 0456 Gross per 24 hour  Intake 240 ml  Output 1650 ml  Net -1410 ml    CVP 11-12  PHYSICAL EXAM: General:   No resp difficulty HEENT: normal Neck: supple. no JVD. Carotids 2+ bilat; no bruits. No lymphadenopathy or thryomegaly appreciated. RIJ Cor: PMI nondisplaced. Regular rate & rhythm. No rubs, gallops or murmurs. Lungs: clear Abdomen: soft, nontender, nondistended. No hepatosplenomegaly. No bruits or masses. Good bowel sounds. Extremities: no cyanosis, clubbing, rash, edema Neuro: alert & orientedx3, cranial nerves grossly intact. moves all 4 extremities w/o difficulty. Affect pleasant   Telemetry   SR 90s   Labs    CBC Recent Labs    12/18/21 0427 12/19/21 0203  WBC 4.5 4.2  HGB 12.8* 11.6*  HCT 37.8* 34.4*  MCV  77.5* 78.2*  PLT 122* 465*   Basic Metabolic Panel Recent Labs    12/18/21 0427 12/19/21 0203  NA 132* 134*  K 3.7 3.9  CL 93* 98  CO2 28 28  GLUCOSE 101* 96  BUN 37* 36*  CREATININE 1.71* 1.78*  CALCIUM 9.0 8.6*   Liver Function Tests No results for input(s): AST, ALT, ALKPHOS, BILITOT, PROT, ALBUMIN in the last 72 hours.  No results for input(s): LIPASE, AMYLASE in the last 72 hours. Cardiac Enzymes No results for input(s): CKTOTAL, CKMB, CKMBINDEX, TROPONINI in the last 72 hours.  BNP: BNP (last 3 results) Recent Labs    07/17/21 1701 12/07/21 0902  BNP 1,662.0* 1,324.0*    ProBNP (last 3 results) No results for input(s): PROBNP in the last 8760 hours.   D-Dimer No results for input(s): DDIMER in the last 72 hours. Hemoglobin A1C No results for input(s): HGBA1C in the last 72 hours. Fasting Lipid Panel No results for input(s): CHOL, HDL, LDLCALC, TRIG, CHOLHDL, LDLDIRECT in the last 72 hours. Thyroid Function Tests No results for input(s): TSH, T4TOTAL, T3FREE, THYROIDAB in the last 72 hours.  Invalid input(s): FREET3   Other results:   Imaging    No results found.   Medications:     Scheduled Medications:  aspirin EC  81 mg Oral Daily   atorvastatin  40 mg Oral QHS   Chlorhexidine Gluconate Cloth  6 each Topical Daily   dapagliflozin propanediol  10 mg Oral Daily   digoxin  0.125 mg Oral Daily   enoxaparin (LOVENOX) injection  40 mg Subcutaneous Daily   insulin aspart  0-15 Units Subcutaneous TID WC   isosorbide-hydrALAZINE  1 tablet Oral TID   ivabradine  5 mg Oral BID WC   melatonin  6 mg Oral QHS   sodium chloride flush  10-40 mL Intracatheter Q12H   sodium chloride flush  3 mL Intravenous Q12H   spironolactone  25 mg Oral Daily   torsemide  40 mg Oral BID    Infusions:  sodium chloride Stopped (12/16/21 0742)    PRN Medications: sodium chloride, acetaminophen, ondansetron (ZOFRAN) IV, oxyCODONE, sodium chloride  flush   Assessment/Plan   1. Acute on chronic systolic CHF: Nonischemic cardiomyopathy.  Cath in 9/22 at [place] with minimal CAD.  Echo in 12/22 at [place] with EF 15-20%, moderate RV dysfunction, mild MR (prior echo in 2020 with EF 60-65%). Echo this admission with EF 20-25%, RV mild to moderately dysfunctional. Cause of cardiomyopathy is uncertain, he has not drunk ETOH since 2020.  RHC on 2/13 showed elevated RA pressure and PCWP with CI 1.59 by thermodilution.  He was started on milrinone gtt, now off.   - CVP 11-12 .Creatinine stable at 1.78 . BP running high. Weight down over 30 pounds.  - Continue torsemide 40 mg bid.    - Add entresto 24-26 mg twice a day. Check BMET next week at his follow up .  - Continue Farxiga 10 mg daily.  - Continue spironolactone to 25 mg daily.  - Holding Coreg with low output and volume overload.  - Continue Bidil to 1 tablet tid  - Continue digoxin.  - Continue ivabradine 5 mg bid.  - Unna boots.  - Cardiac MRI - EF 23% RV 31%. Difficult LGE, probably only nonspecific insertion site LGE.   - Will need to follow carefully for advanced therapies in the future.  Renal function will be a concern.  2. AKI: On CKD stage 3.  Suspect cardiorenal syndrome. Creatinine 2.26 => 2.5>2.3>2.0>>1.85>2.20>1.65>1.72>1.71>1.78 .  3. Hyponatremia: Hypervolemic hyponatremia.  Na 134.  - Keep fluid restricted.   4. Cirrhosis: Thought to be due to ETOH, stopped since 2020.  5. DM2: SSI 6. PAD: 10/20 had left fem-pop bypass and iliac stenting. Has chronic pain in feet that he attributes to PAD.  - Continue ASA 81 - Continue atorvastatin 40 daily  Will make f/u for next week. 2?12/07/21  at 9:30   HF meds for d/c  Torsemide 40 mg twice a day  Farxiga 10 mg daily Entresto 24-26 mg twice a day  Spironolactone 25 mg daily Bidil 1 tab tid Digoxin 0.125 mg daily  Corlanor 5 mg twice a day  ASA 81 mg daily  Atorvastatin 40 mg daily    Length of Stay: 12  Amy Clegg, NP   12/19/2021, 7:23 AM  Advanced Heart Failure Team Pager [phone] (M-F; 7a - 5p)  Please contact [facility] for night-coverage after hours (5p -7a ) and weekends on [email or web address]   Patient seen with NP, agree with the above note.   Co-ox 80% today with CVP 11.  Weight trending down.  Creatinine stable 1.78.    Patient feels good, wants to go home.   Cardiac MRI: 1. Moderately dilated LV with EF 23%, diffuse hypokinesis. 2. Mildly dilated RV with EF 31%. 3. Difficult delayed enhancement imaging. Suspect only nonspecific inferior RV  insertion site LGE. 4. Mildly elevated extracellular volume percentage in the septum, nonspecific.  General: NAD Neck: JVP 9-10 cm, no thyromegaly or thyroid nodule.  Lungs: Clear to auscultation bilaterally with normal respiratory effort. CV: Nondisplaced PMI.  Heart regular S1/S2, no S3/S4, no murmur.  1+ lower extremity edema.   Abdomen: Soft, nontender, no hepatosplenomegaly, no distention.  Skin: Intact without lesions or rashes.  Neurologic: Alert and oriented x 3.  Psych: Normal affect. Extremities: No clubbing or cyanosis.  HEENT: Normal.   Patient is still mildly volume overloaded but gradually losing weight on po torsemide.  Creatinine stable, good co-ox.  Cardiac MRI with severe LV dysfunction, only nonspecific RV insertion site LGE.  - Will start Entresto 24/26 bid today.   I think he can go home today on the above medications with close followup (see NP list) and BMET next week. Will need to follow closely, with low output HF will need to consider for LVAD in the future, needs CPX after discharge.   Loralie Champagne 12/19/2021 8:37 AM

## 2021-12-19 NOTE — Discharge Summary (Signed)
Physician Discharge Summary  Edward Rocha:[id number] DOB: 30-Sep-1966 DOA: 12/07/2021  PCP: Neale Burly, MD  Admit date: 12/07/2021 Discharge date: 12/19/2021  Time spent: 35 minutes  Recommendations for Outpatient Follow-up:  Heart failure clinic 2/28, please check BMP at follow-up PCP in 2 weeks, please repeat TSH in 6 weeks,  Sent gastroenterology referral for liver cirrhosis   Discharge Diagnoses:  Principal Problem:   Acute on chronic systolic CHF (congestive heart failure) ([place]) Active Problems:   Acute respiratory failure with hypoxia ([place])   Acute renal failure superimposed on stage 3a chronic kidney disease ([place])   Type 2 diabetes mellitus with hyperlipidemia ([place])   PAD (peripheral artery disease) ([place])   Pancytopenia ([place])   Cirrhosis of liver ([place])   Hyponatremia   Elevated TSH   Discharge Condition: Stable  Diet recommendation: Low-sodium, diabetic, heart healthy  Filed Weights   12/17/21 1640 12/18/21 0442 12/19/21 0135  Weight: 90.2 kg 87.2 kg 86.8 kg    History of present illness:  55/M with nonischemic cardiomyopathy EF 15-20%, CKD 3a, type 2 diabetes mellitus, liver cirrhosis, PAD was admitted with decompensated heart failure complicated by low output state. -Heart failure team following, started on Lasix and milrinone, right heart cath on 2/13 noted elevated RA pressures and wedge  Hospital Course:   Acute on chronic systolic CHF  Nonischemic cardiomyopathy, cath 9/22 with minimal CAD Echo 12/22 with EF 15-20%, moderate RV dysfunction -Heart failure team following, improved on IV Lasix gtt. and milrinone  -negative 25L, weight down >30lbs, discharge weight 191lbs -Cardiac catheterization severe elevated right and left heart filling pressures, with low cardiac output -Cardiac MRI - EF 23% RV 31%. Difficult LGE, probably only nonspecific insertion site LGE. -Clinically much improved, ambulating with symptoms, discharged home on torsemide 40 Mg  twice daily, spironolactone, Farxiga, Entresto, BiDil, Corlanor, digoxin -Close follow-up in the heart failure clinic on 2/28   Acute renal failure superimposed on stage 3a chronic kidney disease ([place])- (present on admission) Cardiorenal syndrome  Hyponatremia-hypervolemic, -Improved, off Lasix gtt. and tolvaptan now -Creatinine stable, 1.7 at discharge, he will be going home on torsemide 40 Mg twice daily -Needs BMP in 1 week   Type 2 diabetes mellitus with hyperlipidemia ([place])- (present on admission) -Diet controlled, Farxiga added at discharge   PAD (peripheral artery disease) ([place])- (present on admission) On antiplatelet therapy Patient has lower extremity pain, in the setting of low cardiac output heart failure.  -Improved and stable now   Elevated TSH TSH is 6,178 Free T4 is within normal range, recommend repeat TSH in 6 weeks   Hyponatremia- (present on admission) Due to CHF/fluid overload -Improving, also got tolvaptan earlier this admission, now off   Mixed hyperlipidemia Continue statin   Cirrhosis of liver ([place])- (present on admission) -Could be EtOH induced, quit > 2 years ago  -no clinical signs of hepatic encephalopathy or decompensated liver failure -will send referral to gastroenterology   Pancytopenia ([place])- (present on admission) - Mild thrombocytopenia could be secondary to liver cirrhosis, monitor periodically  Discharge Exam: Vitals:   12/19/21 0737 12/19/21 1147  BP: (!) 157/87 132/83  Pulse: 92 82  Resp: 15 19  Temp: 99.5 F (37.5 C) 98 F (36.7 C)  SpO2: 100% 98%   General exam: Chronically ill male sitting up in bed, AAOx3, no distress HEENT: No JVD CVS: S1-S2, regular rhythm Lungs: Clear bilaterally Abdomen: Soft, nontender, bowel sounds present Extremities: Unna boots on, edema improving  Skin: No rashes Psychiatry:  Mood & affect appropriate.      Discharge Instructions   Discharge Instructions     Amb Referral to Cardiac  Rehabilitation   Complete by: As directed    Diagnosis: Heart Failure (see criteria below if ordering Phase II)   Heart Failure Type: Chronic Systolic   After initial evaluation and assessments completed: Virtual Based Care may be provided alone or in conjunction with Phase 2 Cardiac Rehab based on patient barriers.: Yes   Ambulatory referral to Gastroenterology   Complete by: As directed    Liver cirrhosis   What is the reason for referral?: Other   Diet - low sodium heart healthy   Complete by: As directed    Increase activity slowly   Complete by: As directed       Allergies as of 12/19/2021       Reactions   Other    Pt received platelets and had a bad reaction from infusion         Medication List     STOP taking these medications    furosemide 40 MG tablet Commonly known as: LASIX       TAKE these medications    acetaminophen 500 MG tablet Commonly known as: TYLENOL Take 1 tablet (500 mg total) by mouth daily as needed for moderate pain or headache. What changed: how much to take   albuterol 108 (90 Base) MCG/ACT inhaler Commonly known as: VENTOLIN HFA Inhale 1-2 puffs into the lungs every 6 (six) hours as needed for wheezing or shortness of breath.   aspirin EC 81 MG tablet Take 81 mg by mouth daily. Swallow whole.   atorvastatin 40 MG tablet Commonly known as: LIPITOR Take 40 mg by mouth at bedtime.   Corlanor 5 MG Tabs tablet Generic drug: ivabradine Take 1 tablet (5 mg total) by mouth 2 (two) times daily with a meal.   digoxin 0.125 MG tablet Commonly known as: LANOXIN Take 1 tablet (0.125 mg total) by mouth daily.   Entresto 24-26 MG Generic drug: sacubitril-valsartan Take 1 tablet by mouth 2 (two) times daily.   Farxiga 10 MG Tabs tablet Generic drug: dapagliflozin propanediol Take 1 tablet (10 mg total) by mouth daily.   gabapentin 300 MG capsule Commonly known as: NEURONTIN Take 2 capsules (600 mg total) by mouth at bedtime. What  changed: when to take this   isosorbide-hydrALAZINE 20-37.5 MG tablet Commonly known as: BIDIL Take 1 tablet by mouth 3 (three) times daily.   nitroGLYCERIN 0.4 MG SL tablet Commonly known as: NITROSTAT Place 0.4 mg under the tongue every 5 (five) minutes as needed for chest pain.   spironolactone 25 MG tablet Commonly known as: ALDACTONE Take 1 tablet (25 mg total) by mouth daily. What changed: how much to take   torsemide 20 MG tablet Commonly known as: DEMADEX Take 2 tablets (40 mg total) by mouth 2 (two) times daily.       Allergies  Allergen Reactions   Other     Pt received platelets and had a bad reaction from infusion     Follow-up Information     [facility] Follow up on 12/27/2021.   Specialty: Cardiology Why: at 9:30 Contact information: [address] [id number] [place] [id number] [phone]        Neale Burly, MD. Go on 12/26/2021.   Specialty: Internal Medicine Why: @12 :15pm Contact information: [address] [place] [address] [phone]  The results of significant diagnostics from this hospitalization (including imaging, microbiology, ancillary and laboratory) are listed below for reference.    Significant Diagnostic Studies: CARDIAC CATHETERIZATION  Result Date: 12/12/2021 1. Severely elevated right and left heart filling pressures. 2. Low cardiac output by thermodilution, normal by [place]. 3. Primarily pulmonary venous hypertension. Needs extensive diuresis, will start milrinone with Lasix gtt ongoing.   DG CHEST PORT 1 VIEW  Result Date: 12/13/2021 CLINICAL DATA:  PA catheter placement. EXAM: PORTABLE CHEST 1 VIEW COMPARISON:  12/07/2021 FINDINGS: 0857 hours. The cardio pericardial silhouette is enlarged. Diffuse interstitial opacity suggests edema. Right IJ pulmonary artery catheter tip is in the main pulmonary outflow tract. No  pneumothorax or pleural effusion. Telemetry leads overlie the chest. IMPRESSION: Right IJ pulmonary artery catheter tip overlies the main pulmonary outflow tract. Electronically Signed   By: Misty Stanley M.D.   On: 12/13/2021 09:14   DG Chest Port 1 View  Result Date: 12/07/2021 CLINICAL DATA:  Shortness of breath, fluid retention EXAM: PORTABLE CHEST 1 VIEW COMPARISON:  11/10/2021 FINDINGS: Moderate cardiomegaly, stable from prior. Aortic atherosclerosis. Mild pulmonary vascular congestion with slight bilateral perihilar interstitial prominence. Small right pleural effusion. No pneumothorax. IMPRESSION: Cardiomegaly with pulmonary vascular congestion and small right pleural effusion. Electronically Signed   By: Davina Poke D.O.   On: 12/07/2021 09:41   MR CARDIAC MORPHOLOGY W WO CONTRAST  Result Date: 12/18/2021 CLINICAL DATA:  Nonischemic cardiomyopathy EXAM: CARDIAC MRI TECHNIQUE: The patient was scanned on a 1.5 Tesla GE magnet. A dedicated cardiac coil was used. Functional imaging was done using Fiesta sequences. 2,3, and 4 chamber views were done to assess for RWMA's. Modified Simpson's rule using a short axis stack was used to calculate an ejection fraction on a dedicated work Conservation officer, nature. The patient received 13 cc of Gadavist. After 10 minutes inversion recovery sequences were used to assess for infiltration and scar tissue. CONTRAST:  Gadavist 13 cc FINDINGS: Limited images of the lung fields show small to moderate right pleural effusion. Small pericardial effusion. Moderate left ventricular dilation with normal wall thickness. Diffuse hypokinesis with EF 23%. Moderate left and right atrial enlargement. Mildly dilatedl right ventricular size with moderate systolic dysfunction, EF 35%. Mild tricuspid regurgitation noted. Minimal mitral regurgitation. Trileaflet aortic valve with no significant stenosis or regurgitation noted. Delayed enhancement images were difficult. Possible  subepicardial late gadolinium enhancement (LGE) at the basal inferoseptal RV insertion site. MEASUREMENTS: MEASUREMENTS LVEDV 322 mL LVSV 73 mL LVEF 23% RVEDV 274 mL RVSV 84 mL RVEF 31% T1 1075, ECV 31% septum IMPRESSION: 1.  Moderately dilated LV with EF 23%, diffuse hypokinesis. 2.  Mildly dilated RV with EF 31%. 3. Diffuse delayed enhancement imaging. Suspect only nonspecific inferior RV insertion site LGE. 4. Mildly elevated extracellular volume percentage in the septum, nonspecific. Dalton Mclean Electronically Signed   By: Loralie Champagne M.D.   On: 12/18/2021 21:08   ECHOCARDIOGRAM COMPLETE  Result Date: 12/12/2021    ECHOCARDIOGRAM REPORT   Patient Name:   Canyon Lohr. Date of Exam: 12/12/2021 Medical Rec #:  [id number]        Height:       73.0 in Accession #:    [id number]       Weight:       231.4 lb Date of Birth:  [birth year]        BSA:          2.289 m Patient Age:    76 years  BP:           119/88 mmHg Patient Gender: M                HR:           83 bpm. Exam Location:  Inpatient Procedure: 2D Echo, Color Doppler and Cardiac Doppler Indications:    CHF  History:        Patient has prior history of Echocardiogram examinations. Risk                 Factors:Diabetes.  Sonographer:    Jyl Heinz Referring Phys: [id number] [place]  1. Left ventricular ejection fraction, by estimation, is 20 to 25%. The left ventricle has severely decreased function. The left ventricle demonstrates global hypokinesis. There is moderate left ventricular hypertrophy. Left ventricular diastolic parameters are consistent with Grade II diastolic dysfunction (pseudonormalization). Elevated left atrial pressure.  2. Right ventricular systolic function is mild to moderately reduced. The right ventricular size is moderately enlarged. There is moderately elevated pulmonary artery systolic pressure. The estimated right ventricular systolic pressure is 84.6 mmHg.  3. Left atrial size was severely  dilated.  4. Right atrial size was severely dilated.  5. A small pericardial effusion is present.  6. The mitral valve is normal in structure. Trivial mitral valve regurgitation.  7. Tricuspid valve regurgitation is moderate.  8. The aortic valve is tricuspid. Aortic valve regurgitation is not visualized. Aortic valve sclerosis/calcification is present, without any evidence of aortic stenosis.  9. The inferior vena cava is dilated in size with <50% respiratory variability, suggesting right atrial pressure of 15 mmHg. FINDINGS  Left Ventricle: Left ventricular ejection fraction, by estimation, is 20 to 25%. The left ventricle has severely decreased function. The left ventricle demonstrates global hypokinesis. The left ventricular internal cavity size was normal in size. There is moderate left ventricular hypertrophy. Left ventricular diastolic parameters are consistent with Grade II diastolic dysfunction (pseudonormalization). Elevated left atrial pressure. Right Ventricle: The right ventricular size is moderately enlarged. Right vetricular wall thickness was not well visualized. Right ventricular systolic function is mildly reduced. There is moderately elevated pulmonary artery systolic pressure. The tricuspid regurgitant velocity is 3.03 m/s, and with an assumed right atrial pressure of 15 mmHg, the estimated right ventricular systolic pressure is 96.2 mmHg. Left Atrium: Left atrial size was severely dilated. Right Atrium: Right atrial size was severely dilated. Pericardium: A small pericardial effusion is present. Mitral Valve: The mitral valve is normal in structure. Trivial mitral valve regurgitation. Tricuspid Valve: The tricuspid valve is normal in structure. Tricuspid valve regurgitation is moderate. Aortic Valve: The aortic valve is tricuspid. Aortic valve regurgitation is not visualized. Aortic valve sclerosis/calcification is present, without any evidence of aortic stenosis. Aortic valve peak gradient  measures 4.7 mmHg. Pulmonic Valve: The pulmonic valve was grossly normal. Pulmonic valve regurgitation is trivial. Aorta: The aortic root and ascending aorta are structurally normal, with no evidence of dilitation. Venous: The inferior vena cava is dilated in size with less than 50% respiratory variability, suggesting right atrial pressure of 15 mmHg. IAS/Shunts: The interatrial septum was not well visualized.  LEFT VENTRICLE PLAX 2D LVIDd:         5.40 cm      Diastology LVIDs:         4.80 cm      LV e' medial:    4.29 cm/s LV PW:         1.40 cm  LV E/e' medial:  28.0 LV IVS:        1.20 cm      LV e' lateral:   5.96 cm/s LVOT diam:     2.10 cm      LV E/e' lateral: 20.1 LV SV:         47 LV SV Index:   21 LVOT Area:     3.46 cm  LV Volumes (MOD) LV vol d, MOD A2C: 265.0 ml LV vol d, MOD A4C: 197.0 ml LV vol s, MOD A2C: 176.0 ml LV vol s, MOD A4C: 131.0 ml LV SV MOD A2C:     89.0 ml LV SV MOD A4C:     197.0 ml LV SV MOD BP:      74.5 ml RIGHT VENTRICLE            IVC RV Basal diam:  4.90 cm    IVC diam: 2.50 cm RV Mid diam:    3.50 cm RV S prime:     6.75 cm/s TAPSE (M-mode): 1.7 cm LEFT ATRIUM              Index        RIGHT ATRIUM           Index LA diam:        5.20 cm  2.27 cm/m   RA Area:     32.50 cm LA Vol (A2C):   117.0 ml 51.11 ml/m  RA Volume:   134.00 ml 58.54 ml/m LA Vol (A4C):   104.0 ml 45.43 ml/m LA Biplane Vol: 112.0 ml 48.93 ml/m  AORTIC VALVE AV Area (Vmax): 2.30 cm AV Vmax:        108.00 cm/s AV Peak Grad:   4.7 mmHg LVOT Vmax:      71.80 cm/s LVOT Vmean:     51.900 cm/s LVOT VTI:       0.137 m  AORTA Ao Root diam: 3.30 cm Ao Asc diam:  3.10 cm MITRAL VALVE                TRICUSPID VALVE MV Area (PHT): 3.83 cm     TR Peak grad:   36.7 mmHg MV Decel Time: 198 msec     TR Vmax:        303.00 cm/s MR Peak grad: 76.7 mmHg MR Vmax:      438.00 cm/s   SHUNTS MV E velocity: 120.00 cm/s  Systemic VTI:  0.14 m MV A velocity: 67.70 cm/s   Systemic Diam: 2.10 cm MV E/A ratio:  1.77  Oswaldo Milian MD Electronically signed by Oswaldo Milian MD Signature Date/Time: 12/12/2021/12:08:18 PM    Final    [place] EKG SITE RITE  Result Date: 12/12/2021 If Site Rite image not attached, placement could not be confirmed due to current cardiac rhythm.  [place] EKG SITE RITE  Result Date: 12/11/2021 If [facility] image not attached, placement could not be confirmed due to current cardiac rhythm.   Microbiology: Recent Results (from the past 240 hour(s))  MRSA Next Gen by PCR, Nasal     Status: None   Collection Time: 12/12/21  9:35 PM   Specimen: Nasal Mucosa; Nasal Swab  Result Value Ref Range Status   MRSA by PCR Next Gen NOT DETECTED NOT DETECTED Final    Comment: (NOTE) The GeneXpert MRSA Assay (FDA approved for NASAL specimens only), is one component of a comprehensive MRSA colonization surveillance program. It is not intended to diagnose MRSA infection nor  to guide or monitor treatment for MRSA infections. Test performance is not FDA approved in patients less than 24 years old. Performed at [facility], [place] [address], [place], [place] [address]      Labs: Basic Metabolic Panel: Recent Labs  Lab 12/14/21 0430 12/15/21 0349 12/16/21 0318 12/17/21 0422 12/18/21 0427 12/19/21 0203  NA 126* 128* 131* 131* 132* 134*  K 4.4 4.3 4.0 3.9 3.7 3.9  CL 92* 91* 94* 94* 93* 98  CO2 25 27 27 27 28 28   GLUCOSE 97 129* 104* 95 101* 96  BUN 51* 52* 45* 41* 37* 36*  CREATININE 1.85* 2.20* 1.65* 1.72* 1.71* 1.78*  CALCIUM 8.3* 8.7* 8.8* 8.8* 9.0 8.6*  MG 2.0  --  2.1  --   --   --    Liver Function Tests: Recent Labs  Lab 12/13/21 0750  AST 31  ALT 13  ALKPHOS 50  BILITOT 0.9  PROT 7.4  ALBUMIN 2.2*   No results for input(s): LIPASE, AMYLASE in the last 168 hours. No results for input(s): AMMONIA in the last 168 hours. CBC: Recent Labs  Lab 12/15/21 0349 12/16/21 0318 12/17/21 0422 12/18/21 0427 12/19/21 0203  WBC 5.5 5.0 4.9 4.5 4.2  HGB 11.6*  12.0* 12.0* 12.8* 11.6*  HCT 34.0* 36.4* 35.7* 37.8* 34.4*  MCV 77.6* 78.6* 78.6* 77.5* 78.2*  PLT 102* 104* 110* 122* 115*   Cardiac Enzymes: No results for input(s): CKTOTAL, CKMB, CKMBINDEX, TROPONINI in the last 168 hours. BNP: BNP (last 3 results) Recent Labs    07/17/21 1701 12/07/21 0902  BNP 1,662.0* 1,324.0*    ProBNP (last 3 results) No results for input(s): PROBNP in the last 8760 hours.  CBG: Recent Labs  Lab 12/18/21 1138 12/18/21 1546 12/18/21 2059 12/19/21 0609 12/19/21 1144  GLUCAP 120* 143* 195* 111* 109*       Signed:  Domenic Polite MD.  [facility] 12/19/2021, 2:19 PM

## 2021-12-19 NOTE — TOC Benefit Eligibility Note (Signed)
Patient Teacher, English as a foreign language completed.    The patient is currently admitted and upon discharge could be taking Entresto 24-26 mg.  The current 30 day co-pay is, $47.00.   The patient is currently admitted and upon discharge could be taking Farxiga 10 mg.  The current 30 day co-pay is, $47.00.    The patient is insured through [place], [place] Patient Advocate Specialist [place] Patient Advocate Team Direct Number: [phone]  Fax: [phone]

## 2021-12-26 DIAGNOSIS — E782 Mixed hyperlipidemia: Secondary | ICD-10-CM | POA: Diagnosis not present

## 2021-12-26 DIAGNOSIS — N182 Chronic kidney disease, stage 2 (mild): Secondary | ICD-10-CM | POA: Diagnosis not present

## 2021-12-26 DIAGNOSIS — I5023 Acute on chronic systolic (congestive) heart failure: Secondary | ICD-10-CM | POA: Diagnosis not present

## 2021-12-26 DIAGNOSIS — Z6824 Body mass index (BMI) 24.0-24.9, adult: Secondary | ICD-10-CM | POA: Diagnosis not present

## 2021-12-26 DIAGNOSIS — I10 Essential (primary) hypertension: Secondary | ICD-10-CM | POA: Diagnosis not present

## 2021-12-26 DIAGNOSIS — E1121 Type 2 diabetes mellitus with diabetic nephropathy: Secondary | ICD-10-CM | POA: Diagnosis not present

## 2021-12-27 ENCOUNTER — Encounter (HOSPITAL_COMMUNITY): Payer: Medicare Other

## 2021-12-27 ENCOUNTER — Telehealth (HOSPITAL_COMMUNITY): Payer: Self-pay | Admitting: *Deleted

## 2021-12-27 NOTE — Progress Notes (Incomplete)
ADVANCED HF CLINIC CONSULT NOTE  Referring Physician: Primary Care: HF Cardiologist: Dr. Aundra Dubin  HPI: Patient has history of DM2, HTN, CKD stage 3, PAD, and ETOH cirrhosis (quit drinking and smoking in 2020).  He had left fem-pop bypass and iliac stenting in 10/20, hospitalization complicated by AKI requiring transient dialysis.  However, recent creatinine has been in the 1.5 range.     Patient was admitted at [place] last fall with CHF, nonischemic cardiomyopathy.  Last echo in 12/22 showed EF 15-20% with moderate RV dysfunction (done at [facility]).  Cath at [facility] in 9/22 showed mild nonobstructive CAD.  He is normally followed by Dr. Domenic Polite in [place].    More recently, he reports dyspnea and peripheral edema worsening over the week prior to admission to [facility] on 2/8.  He was taking Lasix 40 mg bid at home.  In the ER, he was noted to be volume overloaded.  He was given IV Lasix and started on Entresto.  However, he developed low BP and AKI, and Coreg/Entresto/spironolactone were held starting 2/11.  He was given IVF bolus.  With AKI and ongoing volume overload, he was transferred to [facility].    At [facility], he was started on Lasix gtt 20 mg/hr.  Creatinine was 2.81 yesterday, down to 2.5 today.  Na remains low at 123.  No confusion, etc.  I/Os net negative 1837 yesterday, better diuresis.       Review of Systems: [y] = yes, [ ]  = no   General: Weight gain [ ] ; Weight loss [ ] ; Anorexia [ ] ; Fatigue [ ] ; Fever [ ] ; Chills [ ] ; Weakness [ ]   Cardiac: Chest pain/pressure [ ] ; Resting SOB [ ] ; Exertional SOB [ ] ; Orthopnea [ ] ; Pedal Edema [ ] ; Palpitations [ ] ; Syncope [ ] ; Presyncope [ ] ; Paroxysmal nocturnal dyspnea[ ]   Pulmonary: Cough [ ] ; Wheezing[ ] ; Hemoptysis[ ] ; Sputum [ ] ; Snoring [ ]   GI: Vomiting[ ] ; Dysphagia[ ] ; Melena[ ] ; Hematochezia [ ] ; Heartburn[ ] ; Abdominal pain [ ] ; Constipation [ ] ; Diarrhea [ ] ; BRBPR [ ]   GU: Hematuria[ ] ; Dysuria [ ] ; Nocturia[  ]  Vascular: Pain in legs with walking [ ] ; Pain in feet with lying flat [ ] ; Non-healing sores [ ] ; Stroke [ ] ; TIA [ ] ; Slurred speech [ ] ;  Neuro: Headaches[ ] ; Vertigo[ ] ; Seizures[ ] ; Paresthesias[ ] ;Blurred vision [ ] ; Diplopia [ ] ; Vision changes [ ]   Ortho/Skin: Arthritis [ ] ; Joint pain [ ] ; Muscle pain [ ] ; Joint swelling [ ] ; Back Pain [ ] ; Rash [ ]   Psych: Depression[ ] ; Anxiety[ ]   Heme: Bleeding problems [ ] ; Clotting disorders [ ] ; Anemia [ ]   Endocrine: Diabetes [ ] ; Thyroid dysfunction[ ]    Past Medical History:  Diagnosis Date   Acute on chronic renal failure (HCC)    Essential hypertension    GERD (gastroesophageal reflux disease)    Hyperosmolar syndrome 2020   Hypothyroidism    Mild coronary artery disease    Cardiac catheterization September 2022 - [facility]   Nonischemic cardiomyopathy [facility])    PAD (peripheral artery disease) (HCC)    Thrombocytopenia (HCC)    Type 2 diabetes mellitus (HCC)     Current Outpatient Medications  Medication Sig Dispense Refill   acetaminophen (TYLENOL) 500 MG tablet Take 1 tablet (500 mg total) by mouth daily as needed for moderate pain or headache. 30 tablet 0   albuterol (VENTOLIN HFA) 108 (90 Base) MCG/ACT inhaler  Inhale 1-2 puffs into the lungs every 6 (six) hours as needed for wheezing or shortness of breath.     aspirin EC 81 MG tablet Take 81 mg by mouth daily. Swallow whole.     atorvastatin (LIPITOR) 40 MG tablet Take 40 mg by mouth at bedtime.     dapagliflozin propanediol (FARXIGA) 10 MG TABS tablet Take 1 tablet (10 mg total) by mouth daily. 30 tablet 6   digoxin (LANOXIN) 0.125 MG tablet Take 1 tablet (0.125 mg total) by mouth daily. 30 tablet 6   gabapentin (NEURONTIN) 300 MG capsule Take 2 capsules (600 mg total) by mouth at bedtime.     isosorbide-hydrALAZINE (BIDIL) 20-37.5 MG tablet Take 1 tablet by mouth 3 (three) times daily. 90 tablet 6   ivabradine (CORLANOR) 5 MG TABS tablet Take 1 tablet (5 mg total) by mouth  2 (two) times daily with a meal. 60 tablet 6   nitroGLYCERIN (NITROSTAT) 0.4 MG SL tablet Place 0.4 mg under the tongue every 5 (five) minutes as needed for chest pain.     sacubitril-valsartan (ENTRESTO) 24-26 MG Take 1 tablet by mouth 2 (two) times daily. 60 tablet 6   spironolactone (ALDACTONE) 25 MG tablet Take 1 tablet (25 mg total) by mouth daily. 30 tablet 6   torsemide (DEMADEX) 20 MG tablet Take 2 tablets (40 mg total) by mouth 2 (two) times daily. 240 tablet 6   No current facility-administered medications for this visit.    Allergies  Allergen Reactions   Other     Pt received platelets and had a bad reaction from infusion       Social History   Socioeconomic History   Marital status: Married    Spouse name: Not on file   Number of children: 2   Years of education: Not on file   Highest education level: Not on file  Occupational History   Not on file  Tobacco Use   Smoking status: Former    Packs/day: 1.00    Years: 36.00    Pack years: 36.00    Types: Cigarettes    Quit date: 01/25/2019    Years since quitting: 2.9   Smokeless tobacco: Never  Vaping Use   Vaping Use: Never used  Substance and Sexual Activity   Alcohol use: Not Currently    Comment: per pt's wife, pt is an alcoholic but just recently stopped drinking in 04/2019   Drug use: Never   Sexual activity: Not on file  Other Topics Concern   Not on file  Social History Narrative   Not on file   Social Determinants of Health   Financial Resource Strain: Not on file  Food Insecurity: Not on file  Transportation Needs: Not on file  Physical Activity: Not on file  Stress: Not on file  Social Connections: Not on file  Intimate Partner Violence: Not on file      Family History  Problem Relation Age of Onset   Alcohol abuse Father    Cancer Mother    Alcohol abuse Brother    Bipolar disorder Son    Bipolar disorder Daughter     There were no vitals filed for this visit.  PHYSICAL  EXAM: General:  Well appearing. No respiratory difficulty HEENT: normal Neck: supple. no JVD. Carotids 2+ bilat; no bruits. No lymphadenopathy or thryomegaly appreciated. Cor: PMI nondisplaced. Regular rate & rhythm. No rubs, gallops or murmurs. Lungs: clear Abdomen: soft, nontender, nondistended. No hepatosplenomegaly. No bruits or masses. Good  bowel sounds. Extremities: no cyanosis, clubbing, rash, edema Neuro: alert & oriented x 3, cranial nerves grossly intact. moves all 4 extremities w/o difficulty. Affect pleasant.  ECG:   ASSESSMENT & PLAN: 1. Acute on chronic systolic CHF: Nonischemic cardiomyopathy.  Cath in 9/22 at [place] with minimal CAD.  Echo in 12/22 at [place] with EF 15-20%, moderate RV dysfunction, mild MR (prior echo in 2020 with EF 60-65%). Echo this admission with EF 20-25%, RV mild to moderately dysfunctional. Cause of cardiomyopathy is uncertain, he has not drunk ETOH since 2020.  RHC on 2/13 showed elevated RA pressure and PCWP with CI 1.59 by thermodilution.  He was started on milrinone gtt, now off.   - CVP 11-12 .Creatinine stable at 1.78 . BP running high. Weight down over 30 pounds.  - Continue torsemide 40 mg bid.    - Add entresto 24-26 mg twice a day. Check BMET next week at his follow up .  - Continue Farxiga 10 mg daily.  - Continue spironolactone to 25 mg daily.  - Holding Coreg with low output and volume overload.  - Continue Bidil to 1 tablet tid  - Continue digoxin.  - Continue ivabradine 5 mg bid.  - Unna boots.  - Cardiac MRI - EF 23% RV 31%. Difficult LGE, probably only nonspecific insertion site LGE.   - Will need to follow carefully for advanced therapies in the future.  Renal function will be a concern.  2. AKI: On CKD stage 3.  Suspect cardiorenal syndrome. Creatinine 2.26 => 2.5>2.3>2.0>>1.85>2.20>1.65>1.72>1.71>1.78 .  3. Hyponatremia: Hypervolemic hyponatremia.  Na 134.  - Keep fluid restricted.   4. Cirrhosis: Thought to be due to ETOH,  stopped since 2020.  5. DM2: SSI 6. PAD: 10/20 had left fem-pop bypass and iliac stenting. Has chronic pain in feet that he attributes to PAD.  - Continue ASA 81 - Continue atorvastatin 40 daily   Will make f/u for next week. 2?12/07/21  at 9:30    HF meds for d/c  Torsemide 40 mg twice a day  Farxiga 10 mg daily Entresto 24-26 mg twice a day  Spironolactone 25 mg daily Bidil 1 tab tid Digoxin 0.125 mg daily  Corlanor 5 mg twice a day  ASA 81 mg daily  Atorvastatin 40 mg daily

## 2021-12-27 NOTE — Telephone Encounter (Signed)
pts wife left vm stating pt only wanted to be seen in [place]. called wife back to get more information. no answer/no vm appt cancelled

## 2021-12-28 ENCOUNTER — Telehealth (HOSPITAL_COMMUNITY): Payer: Self-pay

## 2021-12-28 ENCOUNTER — Other Ambulatory Visit (HOSPITAL_COMMUNITY): Payer: Self-pay

## 2021-12-28 NOTE — Telephone Encounter (Signed)
Transitions of Care Pharmacy  ° °Call attempted for a pharmacy transitions of care follow-up. HIPAA appropriate voicemail was left with call back information provided.  ° °Call attempt #1. Will follow-up in 2-3 days.  °  °

## 2021-12-29 ENCOUNTER — Other Ambulatory Visit (HOSPITAL_COMMUNITY): Payer: Self-pay

## 2021-12-29 ENCOUNTER — Telehealth (HOSPITAL_COMMUNITY): Payer: Self-pay

## 2021-12-29 NOTE — Telephone Encounter (Signed)
Pharmacy Transitions of Care Follow-up Telephone Call ? ?Date of discharge: 12/19/21  ?Discharge Diagnosis: CHF ? ?How have you been since you were released from the hospital? He has been doing well. He understood how to take all medications.  ? ?Medication changes made at discharge: ?START taking these medications ? ?START taking these medications  ?Corlanor 5 MG Tabs tablet ?Generic drug: ivabradine ?Take 1 tablet (5 mg total) by mouth 2 (two) times daily with a meal.  ?digoxin 0.125 MG tablet ?Commonly known as: LANOXIN ?Take 1 tablet (0.125 mg total) by mouth daily.  ?Farxiga 10 MG Tabs tablet ?Generic drug: dapagliflozin propanediol ?Take 1 tablet (10 mg total) by mouth daily.  ?isosorbide-hydrALAZINE 20-37.5 MG tablet ?Commonly known as: BIDIL ?Take 1 tablet by mouth 3 (three) times daily.  ?torsemide 20 MG tablet ?Commonly known as: DEMADEX ?Take 2 tablets (40 mg total) by mouth 2 (two) times daily.  ? ?CHANGE how you take these medications ? ?CHANGE how you take these medications  ?acetaminophen 500 MG tablet ?Commonly known as: TYLENOL ?Take 1 tablet (500 mg total) by mouth daily as needed for moderate pain or headache. ?What changed: how much to take  ?gabapentin 300 MG capsule ?Commonly known as: NEURONTIN ?Take 2 capsules (600 mg total) by mouth at bedtime. ?What changed: when to take this  ?spironolactone 25 MG tablet ?Commonly known as: ALDACTONE ?Take 1 tablet (25 mg total) by mouth daily. ?What changed: how much to take  ? ?CONTINUE taking these medications ? ?CONTINUE taking these medications  ?albuterol 108 (90 Base) MCG/ACT inhaler ?Commonly known as: VENTOLIN HFA  ?aspirin EC 81 MG tablet  ?atorvastatin 40 MG tablet ?Commonly known as: LIPITOR  ?Entresto 24-26 MG ?Generic drug: sacubitril-valsartan ?Take 1 tablet by mouth 2 (two) times daily.  ?nitroGLYCERIN 0.4 MG SL tablet ?Commonly known as: NITROSTAT  ? ?STOP taking these medications ? ?STOP taking these medications  ?furosemide 40 MG  tablet ?Commonly known as: LASIX  ?Where to Get Your Medications ? ?Medication changes verified by the patient? yes  ? ?Medication Accessibility: ? ?Home Pharmacy: [facility]  ? ?Was the patient provided with refills on discharged medications? Yes  ? ?Have all prescriptions been transferred from [facility] to home pharmacy? Patient will call preferred pharmacy in [place] to get transfers  ? ?Is the patient able to afford medications? yes ?Notable copays: Corlanor-$100, bidil, entresto, farxiga $47 ?Eligible patient assistance: No ?  ? ?Medication Review: ? ? ?Follow-up Appointments: ?Date Visit Type Length Department   ? 01/13/2022 10:40 AM OFFICE VISIT 20 min [place] [[phone]]  ? ? ?If their condition worsens, is the pt aware to call PCP or go to the Emergency Dept.? yes ? ?Final Patient Assessment: ?Patient is doing well and will contact [facility] to get rx's transferred. ? ?Darcus Austin, PharmD ?Clinical Pharmacist ?[facility] ?12/29/2021 11:08 AM ? ?

## 2022-01-12 NOTE — Progress Notes (Signed)
? ? ?Cardiology Office Note ? ?Date: 01/13/2022  ? ?ID: [name]., DOB 08/17/1966, MRN [id number] ? ?PCP:  Neale Burly, MD  ?Cardiologist:  Rozann Lesches, MD ?Electrophysiologist:  None  ? ?Chief Complaint  ?Patient presents with  ? Hospitalization Follow-up  ? ? ?History of Present Illness: ?Penny Frisbie. is a 56 y.o. male last seen in September 2022.  Records indicate hospitalization in February of this year with acute on chronic systolic heart failure in the setting of nonischemic cardiomyopathy.  Cardiac catheterization in September 2022 showed minimal coronary atherosclerosis ([facility] system).  Echocardiogram most recently demonstrated LVEF 20 to 25% range with mild to moderate RV dysfunction.  He required a course of milrinone and diuresed nearly 30 pounds overall.  Cardiac MRI did not suggest any obvious infiltrative or viral etiologies. ? ?He is here today for follow-up.  Reports NYHA class II dyspnea with low-level activity, currently using a walker.  He is bothered by peripheral neuropathy and chronic leg pain with PAD.  His weight is down from February. ? ?We went over his medications, he reports compliance.  We did discuss uptitrating his Entresto with follow-up BMET.  He has CKD stage IIIb at baseline.  His last creatinine was 1.78 with normal potassium. ? ?Past Medical History:  ?Diagnosis Date  ? Acute on chronic renal failure (HCC)   ? Essential hypertension   ? GERD (gastroesophageal reflux disease)   ? Hyperosmolar syndrome 2020  ? Hypothyroidism   ? Mild coronary artery disease   ? Cardiac catheterization September 2022 - [facility]  ? Nonischemic cardiomyopathy ([place])   ? PAD (peripheral artery disease) ([place])   ? Thrombocytopenia ([place])   ? Type 2 diabetes mellitus ([place])   ? ? ?Past Surgical History:  ?Procedure Laterality Date  ? ABDOMINAL AORTOGRAM W/LOWER EXTREMITY N/A 02/06/2019  ? Procedure: ABDOMINAL AORTOGRAM W/LOWER EXTREMITY;  Surgeon: [name], MD;  Location: [place] [facility];  Service: Cardiovascular;  Laterality: N/A;  bilateral  ? ABDOMINAL AORTOGRAM W/LOWER EXTREMITY N/A 04/30/2019  ? Procedure: ABDOMINAL AORTOGRAM W/LOWER EXTREMITY;  Surgeon: [name], MD;  Location: [place] [facility];  Service: Cardiovascular;  Laterality: N/A;  ? AORTOGRAM  08/11/2019  ? Procedure: Aortogram;  Surgeon: [name], MD;  Location: [place];  Service: Vascular;;  ? ESOPHAGEAL BANDING  08/22/2019  ? Procedure: ESOPHAGEAL BANDING;  Surgeon: Milus Banister, MD;  Location: [facility] ENDOSCOPY;  Service: Endoscopy;;  ? ESOPHAGOGASTRODUODENOSCOPY N/A 08/22/2019  ? Procedure: ESOPHAGOGASTRODUODENOSCOPY (EGD);  Surgeon: Milus Banister, MD;  Location: [facility] ENDOSCOPY;  Service: Endoscopy;  Laterality: N/A;  ? FEMORAL-POPLITEAL BYPASS GRAFT Left 08/11/2019  ? Procedure: BYPASS LEFT FEMORAL-DISTAL POPLITEAL ARTERY USING PROPATEN GRAFT;  Surgeon: [name], MD;  Location: [place];  Service: Vascular;  Laterality: Left;  ? INSERTION OF DIALYSIS CATHETER Right 09/09/2019  ? Procedure: INSERTION OF DIALYSIS CATHETER;  Surgeon: Waynetta Sandy, MD;  Location: [place];  Service: Vascular;  Laterality: Right;  ? MULTIPLE TOOTH EXTRACTIONS    ? PERIPHERAL VASCULAR INTERVENTION  02/06/2019  ? Procedure: PERIPHERAL VASCULAR INTERVENTION;  Surgeon: [name], MD;  Location: [place] [facility];  Service: Cardiovascular;;  Bilateral Iliacs  ? PERIPHERAL VASCULAR INTERVENTION  04/30/2019  ? Procedure: PERIPHERAL VASCULAR INTERVENTION;  Surgeon: [name], MD;  Location: [place] [facility];  Service: Cardiovascular;;  Stent - Lt. Iliac   ? REMOVAL OF A DIALYSIS CATHETER Right 09/09/2019  ? Procedure: Removal Of  A Dialysis Catheter;  Surgeon: Waynetta Sandy, MD;  Location: [place];  Service: Vascular;  Laterality: Right;  ? RIGHT HEART CATH N/A 12/12/2021  ? Procedure: RIGHT HEART CATH;  Surgeon: Larey Dresser, MD;  Location: [place] [facility];   Service: Cardiovascular;  Laterality: N/A;  ? ULTRASOUND GUIDANCE FOR VASCULAR ACCESS Right 09/09/2019  ? Procedure: Ultrasound Guidance For Vascular Access;  Surgeon: Waynetta Sandy, MD;  Location: [place];  Service: Vascular;  Laterality: Right;  ? VASCULAR SURGERY    ? ? ?Current Outpatient Medications  ?Medication Sig Dispense Refill  ? acetaminophen (TYLENOL) 500 MG tablet Take 1 tablet (500 mg total) by mouth daily as needed for moderate pain or headache. 30 tablet 0  ? aspirin EC 81 MG tablet Take 81 mg by mouth daily. Swallow whole.    ? dapagliflozin propanediol (FARXIGA) 10 MG TABS tablet Take 1 tablet (10 mg total) by mouth daily. 30 tablet 6  ? digoxin (LANOXIN) 0.125 MG tablet Take 1 tablet (0.125 mg total) by mouth daily. 30 tablet 6  ? Elastic Bandages & Supports (KNEE SUPPORT/ELASTIC/FIRM MED) MISC 1 each by Does not apply route as directed. Medium pressure compression stockings ?Dx: leg edema 1 each 0  ? furosemide (LASIX) 80 MG tablet Take 80 mg by mouth daily.    ? gabapentin (NEURONTIN) 300 MG capsule Take 600 mg by mouth 3 (three) times daily.    ? isosorbide-hydrALAZINE (BIDIL) 20-37.5 MG tablet Take 1 tablet by mouth 3 (three) times daily. 90 tablet 6  ? ivabradine (CORLANOR) 5 MG TABS tablet Take 1 tablet (5 mg total) by mouth 2 (two) times daily with a meal. 60 tablet 6  ? lidocaine (XYLOCAINE) 5 % ointment Apply 1 application. topically as needed.    ? nitroGLYCERIN (NITROSTAT) 0.4 MG SL tablet Place 0.4 mg under the tongue every 5 (five) minutes as needed for chest pain.    ? sacubitril-valsartan (ENTRESTO) 49-51 MG Take 1 tablet by mouth 2 (two) times daily. 60 tablet 6  ? spironolactone (ALDACTONE) 25 MG tablet Take 12.5 mg by mouth daily.    ? torsemide (DEMADEX) 20 MG tablet Take 2 tablets (40 mg total) by mouth 2 (two) times daily. 240 tablet 6  ? atorvastatin (LIPITOR) 40 MG tablet Take 40 mg by mouth at bedtime. (Patient not taking: Reported on 01/13/2022)    ? ?No current  facility-administered medications for this visit.  ? ?Allergies:  Other  ? ?Social History: The patient  reports that he quit smoking about 2 years ago. His smoking use included cigarettes. He has a 36.00 pack-year smoking history. He has never used smokeless tobacco. He reports that he does not currently use alcohol. He reports that he does not use drugs.  ? ?Family History: The patient's family history includes Alcohol abuse in his brother and father; Bipolar disorder in his daughter and son; Cancer in his mother.  ? ?ROS: No palpitations or syncope. ? ?Physical Exam: ?VS:  BP (!) 142/100   Pulse 94   Ht 6\' 1"  (1.854 m)   Wt 183 lb 3.2 oz (83.1 kg)   SpO2 99%   BMI 24.17 kg/m? , BMI Body mass index is 24.17 kg/m?. ? ?Wt Readings from Last 3 Encounters:  ?01/13/22 183 lb 3.2 oz (83.1 kg)  ?12/19/21 191 lb 6.4 oz (86.8 kg)  ?11/10/21 189 lb 9.5 oz (86 kg)  ?  ?General: Patient appears comfortable at rest. ?HEENT: Conjunctiva and lids normal, wearing a  mask. ?Neck: Supple, no elevated JVP or carotid bruits, no thyromegaly. ?Lungs: Clear to auscultation, nonlabored breathing at rest. ?Cardiac: Regular rate and rhythm, no S3, 3/6 systolic murmur, no pericardial rub. ?Abdomen: Soft, nontender, bowel sounds present. ?Extremities: Mild, firm lower leg edema with decreased distal pulses. ?Skin: Warm and dry. ?Musculoskeletal: No kyphosis. ?Neuropsychiatric: Alert and oriented x3, affect grossly appropriate. ? ?ECG:  An ECG dated 12/13/2021 was personally reviewed today and demonstrated:  Ectopic atrial tachycardia with LVH and repolarization abnormalities. ? ?Recent Labwork: ?12/07/2021: B Natriuretic Peptide 1,324.0 ?12/12/2021: TSH 6.178 ?12/13/2021: ALT 13; AST 31 ?12/16/2021: Magnesium 2.1 ?12/19/2021: BUN 36; Creatinine, Ser 1.78; Hemoglobin 11.6; Platelets 115; Potassium 3.9; Sodium 134  ? ?Other Studies Reviewed Today: ? ?Echocardiogram 12/12/2021: ? 1. Left ventricular ejection fraction, by estimation, is 20 to 25%.  The  ?left ventricle has severely decreased function. The left ventricle  ?demonstrates global hypokinesis. There is moderate left ventricular  ?hypertrophy. Left ventricular diastolic  ?parameters are consistent with Gra

## 2022-01-13 ENCOUNTER — Encounter: Payer: Self-pay | Admitting: Cardiology

## 2022-01-13 ENCOUNTER — Ambulatory Visit (INDEPENDENT_AMBULATORY_CARE_PROVIDER_SITE_OTHER): Payer: Medicare Other | Admitting: Cardiology

## 2022-01-13 VITALS — BP 142/100 | HR 94 | Ht 73.0 in | Wt 183.2 lb

## 2022-01-13 DIAGNOSIS — I428 Other cardiomyopathies: Secondary | ICD-10-CM | POA: Diagnosis not present

## 2022-01-13 DIAGNOSIS — N1832 Chronic kidney disease, stage 3b: Secondary | ICD-10-CM | POA: Diagnosis not present

## 2022-01-13 DIAGNOSIS — I5020 Unspecified systolic (congestive) heart failure: Secondary | ICD-10-CM | POA: Diagnosis not present

## 2022-01-13 DIAGNOSIS — I5042 Chronic combined systolic (congestive) and diastolic (congestive) heart failure: Secondary | ICD-10-CM

## 2022-01-13 DIAGNOSIS — E1165 Type 2 diabetes mellitus with hyperglycemia: Secondary | ICD-10-CM

## 2022-01-13 DIAGNOSIS — I739 Peripheral vascular disease, unspecified: Secondary | ICD-10-CM | POA: Diagnosis not present

## 2022-01-13 MED ORDER — ENTRESTO 49-51 MG PO TABS: 1.0000 | ORAL_TABLET | Freq: Two times a day (BID) | ORAL | 6 refills | Status: DC

## 2022-01-13 MED ORDER — KNEE SUPPORT/ELASTIC/FIRM MED MISC: 1.0000 | 0 refills | Status: DC

## 2022-01-13 NOTE — Patient Instructions (Addendum)
Medication Instructions:  ?Your physician has recommended you make the following change in your medication:  ?Increase entresto to 49/51 mg twice daily ?Continue other medications the same ? ?Labwork: ?BMET in 2 weeks (01/30/22) ?Non-fasting ?[facility] or [facility] ? ?Testing/Procedures: ?none ? ?Follow-Up: ?Your physician recommends that you schedule a follow-up appointment in: 3 months ?Your physician recommends that you schedule a follow-up appointment in: 4-6 weeks in the Heart Failure Clinic ? ?Any Other Special Instructions Will Be Listed Below (If Applicable). ?Compression Stockings prescription sent to your pharmacy and a copy has been given to you. ? ?If you need a refill on your cardiac medications before your next appointment, please call your pharmacy. ?

## 2022-01-18 DIAGNOSIS — Z0000 Encounter for general adult medical examination without abnormal findings: Secondary | ICD-10-CM | POA: Diagnosis not present

## 2022-01-18 DIAGNOSIS — Z6824 Body mass index (BMI) 24.0-24.9, adult: Secondary | ICD-10-CM | POA: Diagnosis not present

## 2022-01-18 DIAGNOSIS — E782 Mixed hyperlipidemia: Secondary | ICD-10-CM | POA: Diagnosis not present

## 2022-01-18 DIAGNOSIS — G588 Other specified mononeuropathies: Secondary | ICD-10-CM | POA: Diagnosis not present

## 2022-01-18 DIAGNOSIS — I5023 Acute on chronic systolic (congestive) heart failure: Secondary | ICD-10-CM | POA: Diagnosis not present

## 2022-01-18 DIAGNOSIS — I10 Essential (primary) hypertension: Secondary | ICD-10-CM | POA: Diagnosis not present

## 2022-01-23 DIAGNOSIS — L97529 Non-pressure chronic ulcer of other part of left foot with unspecified severity: Secondary | ICD-10-CM | POA: Diagnosis not present

## 2022-01-23 DIAGNOSIS — L89610 Pressure ulcer of right heel, unstageable: Secondary | ICD-10-CM | POA: Diagnosis not present

## 2022-01-23 DIAGNOSIS — D62 Acute posthemorrhagic anemia: Secondary | ICD-10-CM | POA: Diagnosis not present

## 2022-01-23 DIAGNOSIS — N1831 Chronic kidney disease, stage 3a: Secondary | ICD-10-CM | POA: Diagnosis not present

## 2022-01-23 DIAGNOSIS — R931 Abnormal findings on diagnostic imaging of heart and coronary circulation: Secondary | ICD-10-CM | POA: Diagnosis not present

## 2022-01-23 DIAGNOSIS — M25571 Pain in right ankle and joints of right foot: Secondary | ICD-10-CM | POA: Diagnosis not present

## 2022-01-23 DIAGNOSIS — Z20822 Contact with and (suspected) exposure to covid-19: Secondary | ICD-10-CM | POA: Diagnosis not present

## 2022-01-23 DIAGNOSIS — E1140 Type 2 diabetes mellitus with diabetic neuropathy, unspecified: Secondary | ICD-10-CM | POA: Diagnosis not present

## 2022-01-23 DIAGNOSIS — N1832 Chronic kidney disease, stage 3b: Secondary | ICD-10-CM | POA: Diagnosis not present

## 2022-01-23 DIAGNOSIS — I428 Other cardiomyopathies: Secondary | ICD-10-CM | POA: Diagnosis not present

## 2022-01-23 DIAGNOSIS — Z95828 Presence of other vascular implants and grafts: Secondary | ICD-10-CM | POA: Diagnosis not present

## 2022-01-23 DIAGNOSIS — Y33XXXA Other specified events, undetermined intent, initial encounter: Secondary | ICD-10-CM | POA: Diagnosis not present

## 2022-01-23 DIAGNOSIS — E1122 Type 2 diabetes mellitus with diabetic chronic kidney disease: Secondary | ICD-10-CM | POA: Diagnosis not present

## 2022-01-23 DIAGNOSIS — B9689 Other specified bacterial agents as the cause of diseases classified elsewhere: Secondary | ICD-10-CM | POA: Diagnosis not present

## 2022-01-23 DIAGNOSIS — I739 Peripheral vascular disease, unspecified: Secondary | ICD-10-CM | POA: Diagnosis not present

## 2022-01-23 DIAGNOSIS — I272 Pulmonary hypertension, unspecified: Secondary | ICD-10-CM | POA: Diagnosis not present

## 2022-01-23 DIAGNOSIS — I70221 Atherosclerosis of native arteries of extremities with rest pain, right leg: Secondary | ICD-10-CM | POA: Diagnosis not present

## 2022-01-23 DIAGNOSIS — S99922A Unspecified injury of left foot, initial encounter: Secondary | ICD-10-CM | POA: Diagnosis not present

## 2022-01-23 DIAGNOSIS — E871 Hypo-osmolality and hyponatremia: Secondary | ICD-10-CM | POA: Diagnosis not present

## 2022-01-23 DIAGNOSIS — R000 Tachycardia, unspecified: Secondary | ICD-10-CM | POA: Diagnosis not present

## 2022-01-23 DIAGNOSIS — Z9889 Other specified postprocedural states: Secondary | ICD-10-CM | POA: Diagnosis not present

## 2022-01-23 DIAGNOSIS — B952 Enterococcus as the cause of diseases classified elsewhere: Secondary | ICD-10-CM | POA: Diagnosis not present

## 2022-01-23 DIAGNOSIS — X58XXXA Exposure to other specified factors, initial encounter: Secondary | ICD-10-CM | POA: Diagnosis not present

## 2022-01-23 DIAGNOSIS — R768 Other specified abnormal immunological findings in serum: Secondary | ICD-10-CM | POA: Diagnosis not present

## 2022-01-23 DIAGNOSIS — I2510 Atherosclerotic heart disease of native coronary artery without angina pectoris: Secondary | ICD-10-CM | POA: Diagnosis not present

## 2022-01-23 DIAGNOSIS — S91302A Unspecified open wound, left foot, initial encounter: Secondary | ICD-10-CM | POA: Diagnosis not present

## 2022-01-23 DIAGNOSIS — D72829 Elevated white blood cell count, unspecified: Secondary | ICD-10-CM | POA: Diagnosis not present

## 2022-01-23 DIAGNOSIS — I361 Nonrheumatic tricuspid (valve) insufficiency: Secondary | ICD-10-CM | POA: Diagnosis not present

## 2022-01-23 DIAGNOSIS — Z7982 Long term (current) use of aspirin: Secondary | ICD-10-CM | POA: Diagnosis not present

## 2022-01-23 DIAGNOSIS — I998 Other disorder of circulatory system: Secondary | ICD-10-CM | POA: Diagnosis not present

## 2022-01-23 DIAGNOSIS — M79673 Pain in unspecified foot: Secondary | ICD-10-CM | POA: Diagnosis not present

## 2022-01-23 DIAGNOSIS — T82898A Other specified complication of vascular prosthetic devices, implants and grafts, initial encounter: Secondary | ICD-10-CM | POA: Diagnosis not present

## 2022-01-23 DIAGNOSIS — Z87891 Personal history of nicotine dependence: Secondary | ICD-10-CM | POA: Diagnosis not present

## 2022-01-23 DIAGNOSIS — M86171 Other acute osteomyelitis, right ankle and foot: Secondary | ICD-10-CM | POA: Diagnosis not present

## 2022-01-23 DIAGNOSIS — Z119 Encounter for screening for infectious and parasitic diseases, unspecified: Secondary | ICD-10-CM | POA: Diagnosis not present

## 2022-01-23 DIAGNOSIS — L089 Local infection of the skin and subcutaneous tissue, unspecified: Secondary | ICD-10-CM | POA: Diagnosis not present

## 2022-01-23 DIAGNOSIS — L97419 Non-pressure chronic ulcer of right heel and midfoot with unspecified severity: Secondary | ICD-10-CM | POA: Diagnosis not present

## 2022-01-23 DIAGNOSIS — M868X8 Other osteomyelitis, other site: Secondary | ICD-10-CM | POA: Diagnosis not present

## 2022-01-23 DIAGNOSIS — S91301D Unspecified open wound, right foot, subsequent encounter: Secondary | ICD-10-CM | POA: Diagnosis not present

## 2022-01-23 DIAGNOSIS — I70261 Atherosclerosis of native arteries of extremities with gangrene, right leg: Secondary | ICD-10-CM | POA: Diagnosis not present

## 2022-01-23 DIAGNOSIS — E785 Hyperlipidemia, unspecified: Secondary | ICD-10-CM | POA: Diagnosis not present

## 2022-01-23 DIAGNOSIS — E875 Hyperkalemia: Secondary | ICD-10-CM | POA: Diagnosis not present

## 2022-01-23 DIAGNOSIS — I38 Endocarditis, valve unspecified: Secondary | ICD-10-CM | POA: Diagnosis not present

## 2022-01-23 DIAGNOSIS — Z79899 Other long term (current) drug therapy: Secondary | ICD-10-CM | POA: Diagnosis not present

## 2022-01-23 DIAGNOSIS — I509 Heart failure, unspecified: Secondary | ICD-10-CM | POA: Diagnosis not present

## 2022-01-23 DIAGNOSIS — I70222 Atherosclerosis of native arteries of extremities with rest pain, left leg: Secondary | ICD-10-CM | POA: Diagnosis not present

## 2022-01-23 DIAGNOSIS — E43 Unspecified severe protein-calorie malnutrition: Secondary | ICD-10-CM | POA: Diagnosis not present

## 2022-01-23 DIAGNOSIS — E1152 Type 2 diabetes mellitus with diabetic peripheral angiopathy with gangrene: Secondary | ICD-10-CM | POA: Diagnosis not present

## 2022-01-23 DIAGNOSIS — S91301A Unspecified open wound, right foot, initial encounter: Secondary | ICD-10-CM | POA: Diagnosis not present

## 2022-01-23 DIAGNOSIS — I130 Hypertensive heart and chronic kidney disease with heart failure and stage 1 through stage 4 chronic kidney disease, or unspecified chronic kidney disease: Secondary | ICD-10-CM | POA: Diagnosis not present

## 2022-01-23 DIAGNOSIS — I5022 Chronic systolic (congestive) heart failure: Secondary | ICD-10-CM | POA: Diagnosis not present

## 2022-01-23 DIAGNOSIS — E11628 Type 2 diabetes mellitus with other skin complications: Secondary | ICD-10-CM | POA: Diagnosis not present

## 2022-01-23 DIAGNOSIS — M7989 Other specified soft tissue disorders: Secondary | ICD-10-CM | POA: Diagnosis not present

## 2022-01-23 DIAGNOSIS — I10 Essential (primary) hypertension: Secondary | ICD-10-CM | POA: Diagnosis not present

## 2022-01-23 DIAGNOSIS — M79672 Pain in left foot: Secondary | ICD-10-CM | POA: Diagnosis not present

## 2022-01-26 DIAGNOSIS — S91301A Unspecified open wound, right foot, initial encounter: Secondary | ICD-10-CM | POA: Diagnosis not present

## 2022-01-30 DIAGNOSIS — S91302A Unspecified open wound, left foot, initial encounter: Secondary | ICD-10-CM | POA: Diagnosis present

## 2022-02-06 NOTE — Progress Notes (Incomplete)
? ?ADVANCED HF CLINIC CONSULT NOTE ? ? ?Primary Care: Neale Burly, MD ?Primary Cardiologist: Dr. Domenic Polite ?HF Cardiologist: Dr. Aundra Dubin ? ?HPI: ?Edward Rocha is a 56 y.o. male with a history of DM2, HTN, CKD stage 3, PAD, and ETOH cirrhosis (quit drinking and smoking in 2020).  He had left fem-pop bypass and iliac stenting in 10/20, hospitalization complicated by AKI requiring transient dialysis.  However, recent creatinine has been in the 1.5 range.   ?  ?Patient was admitted at [facility] with CHF, nonischemic cardiomyopathy.  Last echo in 12/22 showed EF 15-20% with moderate RV dysfunction (done at [facility]).  Cath at [facility] in 9/22 showed mild nonobstructive CAD.  He is normally followed by Dr. Domenic Polite in [place].  ?  ?Admitted 2/23 with a/c CHF. Diuresed with Lasix gtt. Echo showed EF 20-25%, RV mild to moderately dysfunctional. RHC showed elevated RA pressure and PCWP with CI 1.59 by thermodilution.  He was started on milrinone gtt, able to wean this and lasix off. Cardiac MRI showed LVEF 23% RV 31%. Difficult LGE, probably only nonspecific insertion site LGE. GDMT limited by AKI. Discharged home, weight 191 lbs (diuresed 30 lbs). ? ?Follow up with Dr. Domenic Polite 3/23, Delene Loll increased. Reported medication compliance, continued with pain in legs with walking. ? ?Admitted to [facility] 4/23 with foot wound. CTA with runoff with severe PAD. Underwent revascularization with thromboendarterectomy and iliac stent placement. Repeat ABIs, however, demonstrated ongoing limited flow. Therefore, he returned to the OR  for further endovascular interventions. He should continue aspirin, Plavix and statin. Echo 4/23 showed EF 30%. He will complete a 6-week course of amoxicillin and ciprofloxacin (end date 5/14). He was discharged to SNF with wound vac to right heel, weight 191 lbs. ? ?Today he returns for post hospital HF follow up. Overall feeling fine. Denies increasing SOB, CP, dizziness, edema, or  PND/Orthopnea. Appetite ok. No fever or chills. Weight at home 170 pounds. Taking all medications.  ? ?ECG (personally reviewed): ? ?Labs (4/23): K 4.1, creatinine 1.2 ? ?Cardiac Studies: ?- Echo (4/23) at [facility]: EF 30%, normal RV ? ?- Echo (2/23): EF 20-25%, RV mild to moderately dysfunctional. ? ?- cMRI (2/23): LVEF 23% RV 31%. Difficult LGE, probably only nonspecific insertion site LGE. ? ?- RHC (2/23): elevated RA pressure and PCWP with CI 1.59 by thermodilution. ? ?RA mean 22 ?RV 79/28 ?PA 67/32, mean 42 ?PCWP mean 25 ? ?Oxygen saturations: ?PA 68% ?AO 100% ? ?Cardiac Output (Fick) 5.55  ?Cardiac Index (Fick) 2.43 ?PVR 3 WU ? ?Cardiac Output (Thermo) 3.64 ?Cardiac Index (Thermo) 1.59  ?PVR 4.7 WU ? ?Review of Systems: [y] = yes, [ ]  = no  ? ?General: Weight gain [ ] ; Weight loss [ ] ; Anorexia [ ] ; Fatigue [ ] ; Fever [ ] ; Chills [ ] ; Weakness [ ]   ?Cardiac: Chest pain/pressure [ ] ; Resting SOB [ ] ; Exertional SOB [ ] ; Orthopnea [ ] ; Pedal Edema [ ] ; Palpitations [ ] ; Syncope [ ] ; Presyncope [ ] ; Paroxysmal nocturnal dyspnea[ ]   ?Pulmonary: Cough [ ] ; Wheezing[ ] ; Hemoptysis[ ] ; Sputum [ ] ; Snoring [ ]   ?GI: Vomiting[ ] ; Dysphagia[ ] ; Melena[ ] ; Hematochezia [ ] ; Heartburn[ ] ; Abdominal pain [ ] ; Constipation [ ] ; Diarrhea [ ] ; BRBPR [ ]   ?GU: Hematuria[ ] ; Dysuria [ ] ; Nocturia[ ]   ?Vascular: Pain in legs with walking [ ] ; Pain in feet with lying flat [ ] ; Non-healing sores [ ] ; Stroke [ ] ; TIA [ ] ; Slurred speech [ ] ;  ?  Neuro: Headaches[ ] ; Vertigo[ ] ; Seizures[ ] ; Paresthesias[ ] ;Blurred vision [ ] ; Diplopia [ ] ; Vision changes [ ]   ?Ortho/Skin: Arthritis [ ] ; Joint pain [ ] ; Muscle pain [ ] ; Joint swelling [ ] ; Back Pain [ ] ; Rash [ ]   ?Psych: Depression[ ] ; Anxiety[ ]   ?Heme: Bleeding problems [ ] ; Clotting disorders [ ] ; Anemia [ ]   ?Endocrine: Diabetes [ ] ; Thyroid dysfunction[ ]  ? ? ?Past Medical History:  ?Diagnosis Date  ? Acute on chronic renal failure (HCC)   ? Essential hypertension   ? GERD  (gastroesophageal reflux disease)   ? Hyperosmolar syndrome 2020  ? Hypothyroidism   ? Mild coronary artery disease   ? Cardiac catheterization September 2022 - [facility]  ? Nonischemic cardiomyopathy ([place])   ? PAD (peripheral artery disease) ([place])   ? Thrombocytopenia ([place])   ? Type 2 diabetes mellitus ([place])   ? ? ?Current Outpatient Medications  ?Medication Sig Dispense Refill  ? acetaminophen (TYLENOL) 500 MG tablet Take 1 tablet (500 mg total) by mouth daily as needed for moderate pain or headache. 30 tablet 0  ? aspirin EC 81 MG tablet Take 81 mg by mouth daily. Swallow whole.    ? atorvastatin (LIPITOR) 40 MG tablet Take 40 mg by mouth at bedtime. (Patient not taking: Reported on 01/13/2022)    ? dapagliflozin propanediol (FARXIGA) 10 MG TABS tablet Take 1 tablet (10 mg total) by mouth daily. 30 tablet 6  ? digoxin (LANOXIN) 0.125 MG tablet Take 1 tablet (0.125 mg total) by mouth daily. 30 tablet 6  ? Elastic Bandages & Supports (KNEE SUPPORT/ELASTIC/FIRM MED) MISC 1 each by Does not apply route as directed. Medium pressure compression stockings ?Dx: leg edema 1 each 0  ? furosemide (LASIX) 80 MG tablet Take 80 mg by mouth daily.    ? gabapentin (NEURONTIN) 300 MG capsule Take 600 mg by mouth 3 (three) times daily.    ? isosorbide-hydrALAZINE (BIDIL) 20-37.5 MG tablet Take 1 tablet by mouth 3 (three) times daily. 90 tablet 6  ? ivabradine (CORLANOR) 5 MG TABS tablet Take 1 tablet (5 mg total) by mouth 2 (two) times daily with a meal. 60 tablet 6  ? lidocaine (XYLOCAINE) 5 % ointment Apply 1 application. topically as needed.    ? nitroGLYCERIN (NITROSTAT) 0.4 MG SL tablet Place 0.4 mg under the tongue every 5 (five) minutes as needed for chest pain.    ? sacubitril-valsartan (ENTRESTO) 49-51 MG Take 1 tablet by mouth 2 (two) times daily. 60 tablet 6  ? spironolactone (ALDACTONE) 25 MG tablet Take 12.5 mg by mouth daily.    ? torsemide (DEMADEX) 20 MG tablet Take 2 tablets (40 mg total) by mouth 2 (two) times daily.  240 tablet 6  ? ?No current facility-administered medications for this visit.  ? ? ?Allergies  ?Allergen Reactions  ? Other   ?  Pt received platelets and had a bad reaction from infusion   ? ? ?  ?Social History  ? ?Socioeconomic History  ? Marital status: Married  ?  Spouse name: Not on file  ? Number of children: 2  ? Years of education: Not on file  ? Highest education level: Not on file  ?Occupational History  ? Not on file  ?Tobacco Use  ? Smoking status: Former  ?  Packs/day: 1.00  ?  Years: 36.00  ?  Pack years: 36.00  ?  Types: Cigarettes  ?  Quit date: 01/25/2019  ?  Years since quitting: 3.0  ?  Smokeless tobacco: Never  ?Vaping Use  ? Vaping Use: Never used  ?Substance and Sexual Activity  ? Alcohol use: Not Currently  ?  Comment: per pt's wife, pt is an alcoholic but just recently stopped drinking in 04/2019  ? Drug use: Never  ? Sexual activity: Not on file  ?Other Topics Concern  ? Not on file  ?Social History Narrative  ? Not on file  ? ?Social Determinants of Health  ? ?Financial Resource Strain: Not on file  ?Food Insecurity: Not on file  ?Transportation Needs: Not on file  ?Physical Activity: Not on file  ?Stress: Not on file  ?Social Connections: Not on file  ?Intimate Partner Violence: Not on file  ? ? ?  ?Family History  ?Problem Relation Age of Onset  ? Alcohol abuse Father   ? Cancer Mother   ? Alcohol abuse Brother   ? Bipolar disorder Son   ? Bipolar disorder Daughter   ? ?There were no vitals filed for this visit. ? ?PHYSICAL EXAM: ?General:  NAD. No resp difficulty ?HEENT: Normal ?Neck: Supple. No JVD. Carotids 2+ bilat; no bruits. No lymphadenopathy or thryomegaly appreciated. ?Cor: PMI nondisplaced. Regular rate & rhythm. No rubs, gallops or murmurs. ?Lungs: Clear ?Abdomen: Soft, nontender, nondistended. No hepatosplenomegaly. No bruits or masses. Good bowel sounds. ?Extremities: No cyanosis, clubbing, rash, edema ?Neuro: Alert & oriented x 3, cranial nerves grossly intact. Moves all 4  extremities w/o difficulty. Affect pleasant. ? ?ASSESSMENT & PLAN: ?1. Chronic systolic CHF: Nonischemic cardiomyopathy.  Cath in 9/22 at [facility] with minimal CAD.  Echo in 12/22 at [facility] with EF 15-20%, moderate RV dysfunc

## 2022-02-07 ENCOUNTER — Encounter (HOSPITAL_COMMUNITY): Payer: Medicare Other

## 2022-02-09 DIAGNOSIS — Z7984 Long term (current) use of oral hypoglycemic drugs: Secondary | ICD-10-CM | POA: Diagnosis not present

## 2022-02-09 DIAGNOSIS — Z7902 Long term (current) use of antithrombotics/antiplatelets: Secondary | ICD-10-CM | POA: Diagnosis not present

## 2022-02-09 DIAGNOSIS — N1832 Chronic kidney disease, stage 3b: Secondary | ICD-10-CM | POA: Diagnosis not present

## 2022-02-09 DIAGNOSIS — E039 Hypothyroidism, unspecified: Secondary | ICD-10-CM | POA: Diagnosis not present

## 2022-02-09 DIAGNOSIS — I2510 Atherosclerotic heart disease of native coronary artery without angina pectoris: Secondary | ICD-10-CM | POA: Diagnosis not present

## 2022-02-09 DIAGNOSIS — I509 Heart failure, unspecified: Secondary | ICD-10-CM | POA: Diagnosis not present

## 2022-02-09 DIAGNOSIS — I130 Hypertensive heart and chronic kidney disease with heart failure and stage 1 through stage 4 chronic kidney disease, or unspecified chronic kidney disease: Secondary | ICD-10-CM | POA: Diagnosis not present

## 2022-02-09 DIAGNOSIS — K7460 Unspecified cirrhosis of liver: Secondary | ICD-10-CM | POA: Diagnosis not present

## 2022-02-09 DIAGNOSIS — E1122 Type 2 diabetes mellitus with diabetic chronic kidney disease: Secondary | ICD-10-CM | POA: Diagnosis not present

## 2022-02-09 DIAGNOSIS — L89610 Pressure ulcer of right heel, unstageable: Secondary | ICD-10-CM | POA: Diagnosis not present

## 2022-02-09 DIAGNOSIS — E1140 Type 2 diabetes mellitus with diabetic neuropathy, unspecified: Secondary | ICD-10-CM | POA: Diagnosis not present

## 2022-02-09 DIAGNOSIS — S91301D Unspecified open wound, right foot, subsequent encounter: Secondary | ICD-10-CM | POA: Diagnosis not present

## 2022-02-09 DIAGNOSIS — M86171 Other acute osteomyelitis, right ankle and foot: Secondary | ICD-10-CM | POA: Diagnosis not present

## 2022-02-09 DIAGNOSIS — Z7982 Long term (current) use of aspirin: Secondary | ICD-10-CM | POA: Diagnosis not present

## 2022-02-09 DIAGNOSIS — E1151 Type 2 diabetes mellitus with diabetic peripheral angiopathy without gangrene: Secondary | ICD-10-CM | POA: Diagnosis not present

## 2022-02-09 DIAGNOSIS — D696 Thrombocytopenia, unspecified: Secondary | ICD-10-CM | POA: Diagnosis not present

## 2022-02-09 DIAGNOSIS — K219 Gastro-esophageal reflux disease without esophagitis: Secondary | ICD-10-CM | POA: Diagnosis not present

## 2022-02-09 NOTE — Patient Outreach (Signed)
Received a [facility] discharge notification for Edward Rocha. ?I have assigned Raina Mina, RN to call for follow up and determine if there are any Case Management needs.  ?  ?Arville Care, CBCS, CMAA ?[place] Management Assistant ?[place] Management ?[phone]   ?

## 2022-02-13 ENCOUNTER — Other Ambulatory Visit: Payer: Self-pay | Admitting: Cardiology

## 2022-02-13 ENCOUNTER — Other Ambulatory Visit: Payer: Self-pay | Admitting: *Deleted

## 2022-02-13 DIAGNOSIS — E1140 Type 2 diabetes mellitus with diabetic neuropathy, unspecified: Secondary | ICD-10-CM | POA: Diagnosis not present

## 2022-02-13 DIAGNOSIS — L89610 Pressure ulcer of right heel, unstageable: Secondary | ICD-10-CM | POA: Diagnosis not present

## 2022-02-13 DIAGNOSIS — Z7902 Long term (current) use of antithrombotics/antiplatelets: Secondary | ICD-10-CM | POA: Diagnosis not present

## 2022-02-13 DIAGNOSIS — Z7982 Long term (current) use of aspirin: Secondary | ICD-10-CM | POA: Diagnosis not present

## 2022-02-13 DIAGNOSIS — K219 Gastro-esophageal reflux disease without esophagitis: Secondary | ICD-10-CM | POA: Diagnosis not present

## 2022-02-13 DIAGNOSIS — I130 Hypertensive heart and chronic kidney disease with heart failure and stage 1 through stage 4 chronic kidney disease, or unspecified chronic kidney disease: Secondary | ICD-10-CM | POA: Diagnosis not present

## 2022-02-13 DIAGNOSIS — Z7984 Long term (current) use of oral hypoglycemic drugs: Secondary | ICD-10-CM | POA: Diagnosis not present

## 2022-02-13 DIAGNOSIS — I2510 Atherosclerotic heart disease of native coronary artery without angina pectoris: Secondary | ICD-10-CM | POA: Diagnosis not present

## 2022-02-13 DIAGNOSIS — M86171 Other acute osteomyelitis, right ankle and foot: Secondary | ICD-10-CM | POA: Diagnosis not present

## 2022-02-13 DIAGNOSIS — I509 Heart failure, unspecified: Secondary | ICD-10-CM | POA: Diagnosis not present

## 2022-02-13 DIAGNOSIS — S91301D Unspecified open wound, right foot, subsequent encounter: Secondary | ICD-10-CM | POA: Diagnosis not present

## 2022-02-13 DIAGNOSIS — E1151 Type 2 diabetes mellitus with diabetic peripheral angiopathy without gangrene: Secondary | ICD-10-CM | POA: Diagnosis not present

## 2022-02-13 DIAGNOSIS — K7460 Unspecified cirrhosis of liver: Secondary | ICD-10-CM | POA: Diagnosis not present

## 2022-02-13 DIAGNOSIS — E039 Hypothyroidism, unspecified: Secondary | ICD-10-CM | POA: Diagnosis not present

## 2022-02-13 DIAGNOSIS — E1122 Type 2 diabetes mellitus with diabetic chronic kidney disease: Secondary | ICD-10-CM | POA: Diagnosis not present

## 2022-02-13 DIAGNOSIS — N1832 Chronic kidney disease, stage 3b: Secondary | ICD-10-CM | POA: Diagnosis not present

## 2022-02-13 DIAGNOSIS — D696 Thrombocytopenia, unspecified: Secondary | ICD-10-CM | POA: Diagnosis not present

## 2022-02-13 NOTE — Patient Outreach (Signed)
[facility]) Care Management ? ?02/13/2022 ? ?Calla Kicks Hessie Diener. ?April 13, 1966 ?[id number] ? ?Initial Telephone Assessment ? ?RN spoke with pt concerning [facility] services and the purpose for today's call. Pt receptive and provided some information concerning his health history. Pt verifies he is involved with [facility] and has followed with the provider at [facility] last week. States he is only able to "hop around" right now but continues to work with HH[facility]. States he is not really able to weight just yet due to his wounds related to his HF and his CBGs usually rang around 109. Denies any issues with GU and GI with transport in the home from his wife [name]. Aware of all his medications and taking accordingly with no delays. ? ?Pt interested in enrolling however requested a call back later in this week to complete the initial assessment and will discuss enrollment program more in dept at that time. ? ?Pt agreeable to Friday afternoon to finalize his enrollment. Will follow up according with a scheduled appointment noted. ? ?Raina Mina, RN ?Care Management Coordinator ?[place] ?Main Office [phone]  ? ?

## 2022-02-14 DIAGNOSIS — L89610 Pressure ulcer of right heel, unstageable: Secondary | ICD-10-CM | POA: Diagnosis not present

## 2022-02-14 DIAGNOSIS — K219 Gastro-esophageal reflux disease without esophagitis: Secondary | ICD-10-CM | POA: Diagnosis not present

## 2022-02-14 DIAGNOSIS — I2510 Atherosclerotic heart disease of native coronary artery without angina pectoris: Secondary | ICD-10-CM | POA: Diagnosis not present

## 2022-02-14 DIAGNOSIS — Z7984 Long term (current) use of oral hypoglycemic drugs: Secondary | ICD-10-CM | POA: Diagnosis not present

## 2022-02-14 DIAGNOSIS — I509 Heart failure, unspecified: Secondary | ICD-10-CM | POA: Diagnosis not present

## 2022-02-14 DIAGNOSIS — N1832 Chronic kidney disease, stage 3b: Secondary | ICD-10-CM | POA: Diagnosis not present

## 2022-02-14 DIAGNOSIS — E1140 Type 2 diabetes mellitus with diabetic neuropathy, unspecified: Secondary | ICD-10-CM | POA: Diagnosis not present

## 2022-02-14 DIAGNOSIS — Z7982 Long term (current) use of aspirin: Secondary | ICD-10-CM | POA: Diagnosis not present

## 2022-02-14 DIAGNOSIS — E1122 Type 2 diabetes mellitus with diabetic chronic kidney disease: Secondary | ICD-10-CM | POA: Diagnosis not present

## 2022-02-14 DIAGNOSIS — I130 Hypertensive heart and chronic kidney disease with heart failure and stage 1 through stage 4 chronic kidney disease, or unspecified chronic kidney disease: Secondary | ICD-10-CM | POA: Diagnosis not present

## 2022-02-14 DIAGNOSIS — K7460 Unspecified cirrhosis of liver: Secondary | ICD-10-CM | POA: Diagnosis not present

## 2022-02-14 DIAGNOSIS — S91301D Unspecified open wound, right foot, subsequent encounter: Secondary | ICD-10-CM | POA: Diagnosis not present

## 2022-02-14 DIAGNOSIS — D696 Thrombocytopenia, unspecified: Secondary | ICD-10-CM | POA: Diagnosis not present

## 2022-02-14 DIAGNOSIS — M86171 Other acute osteomyelitis, right ankle and foot: Secondary | ICD-10-CM | POA: Diagnosis not present

## 2022-02-14 DIAGNOSIS — Z7902 Long term (current) use of antithrombotics/antiplatelets: Secondary | ICD-10-CM | POA: Diagnosis not present

## 2022-02-14 DIAGNOSIS — E039 Hypothyroidism, unspecified: Secondary | ICD-10-CM | POA: Diagnosis not present

## 2022-02-14 DIAGNOSIS — E1151 Type 2 diabetes mellitus with diabetic peripheral angiopathy without gangrene: Secondary | ICD-10-CM | POA: Diagnosis not present

## 2022-02-15 DIAGNOSIS — D696 Thrombocytopenia, unspecified: Secondary | ICD-10-CM | POA: Diagnosis not present

## 2022-02-15 DIAGNOSIS — I130 Hypertensive heart and chronic kidney disease with heart failure and stage 1 through stage 4 chronic kidney disease, or unspecified chronic kidney disease: Secondary | ICD-10-CM | POA: Diagnosis not present

## 2022-02-15 DIAGNOSIS — K7460 Unspecified cirrhosis of liver: Secondary | ICD-10-CM | POA: Diagnosis not present

## 2022-02-15 DIAGNOSIS — Z7984 Long term (current) use of oral hypoglycemic drugs: Secondary | ICD-10-CM | POA: Diagnosis not present

## 2022-02-15 DIAGNOSIS — I2510 Atherosclerotic heart disease of native coronary artery without angina pectoris: Secondary | ICD-10-CM | POA: Diagnosis not present

## 2022-02-15 DIAGNOSIS — E1122 Type 2 diabetes mellitus with diabetic chronic kidney disease: Secondary | ICD-10-CM | POA: Diagnosis not present

## 2022-02-15 DIAGNOSIS — K219 Gastro-esophageal reflux disease without esophagitis: Secondary | ICD-10-CM | POA: Diagnosis not present

## 2022-02-15 DIAGNOSIS — Z7902 Long term (current) use of antithrombotics/antiplatelets: Secondary | ICD-10-CM | POA: Diagnosis not present

## 2022-02-15 DIAGNOSIS — S91301D Unspecified open wound, right foot, subsequent encounter: Secondary | ICD-10-CM | POA: Diagnosis not present

## 2022-02-15 DIAGNOSIS — M86171 Other acute osteomyelitis, right ankle and foot: Secondary | ICD-10-CM | POA: Diagnosis not present

## 2022-02-15 DIAGNOSIS — E1140 Type 2 diabetes mellitus with diabetic neuropathy, unspecified: Secondary | ICD-10-CM | POA: Diagnosis not present

## 2022-02-15 DIAGNOSIS — Z7982 Long term (current) use of aspirin: Secondary | ICD-10-CM | POA: Diagnosis not present

## 2022-02-15 DIAGNOSIS — L89610 Pressure ulcer of right heel, unstageable: Secondary | ICD-10-CM | POA: Diagnosis not present

## 2022-02-15 DIAGNOSIS — E1151 Type 2 diabetes mellitus with diabetic peripheral angiopathy without gangrene: Secondary | ICD-10-CM | POA: Diagnosis not present

## 2022-02-15 DIAGNOSIS — N1832 Chronic kidney disease, stage 3b: Secondary | ICD-10-CM | POA: Diagnosis not present

## 2022-02-15 DIAGNOSIS — E039 Hypothyroidism, unspecified: Secondary | ICD-10-CM | POA: Diagnosis not present

## 2022-02-15 DIAGNOSIS — I509 Heart failure, unspecified: Secondary | ICD-10-CM | POA: Diagnosis not present

## 2022-02-17 ENCOUNTER — Other Ambulatory Visit: Payer: Self-pay | Admitting: *Deleted

## 2022-02-17 DIAGNOSIS — Z7902 Long term (current) use of antithrombotics/antiplatelets: Secondary | ICD-10-CM | POA: Diagnosis not present

## 2022-02-17 DIAGNOSIS — E1151 Type 2 diabetes mellitus with diabetic peripheral angiopathy without gangrene: Secondary | ICD-10-CM | POA: Diagnosis not present

## 2022-02-17 DIAGNOSIS — Z7982 Long term (current) use of aspirin: Secondary | ICD-10-CM | POA: Diagnosis not present

## 2022-02-17 DIAGNOSIS — E1122 Type 2 diabetes mellitus with diabetic chronic kidney disease: Secondary | ICD-10-CM | POA: Diagnosis not present

## 2022-02-17 DIAGNOSIS — L89610 Pressure ulcer of right heel, unstageable: Secondary | ICD-10-CM | POA: Diagnosis not present

## 2022-02-17 DIAGNOSIS — I130 Hypertensive heart and chronic kidney disease with heart failure and stage 1 through stage 4 chronic kidney disease, or unspecified chronic kidney disease: Secondary | ICD-10-CM | POA: Diagnosis not present

## 2022-02-17 DIAGNOSIS — S91301D Unspecified open wound, right foot, subsequent encounter: Secondary | ICD-10-CM | POA: Diagnosis not present

## 2022-02-17 DIAGNOSIS — I509 Heart failure, unspecified: Secondary | ICD-10-CM | POA: Diagnosis not present

## 2022-02-17 DIAGNOSIS — K7460 Unspecified cirrhosis of liver: Secondary | ICD-10-CM | POA: Diagnosis not present

## 2022-02-17 DIAGNOSIS — E039 Hypothyroidism, unspecified: Secondary | ICD-10-CM | POA: Diagnosis not present

## 2022-02-17 DIAGNOSIS — N1832 Chronic kidney disease, stage 3b: Secondary | ICD-10-CM | POA: Diagnosis not present

## 2022-02-17 DIAGNOSIS — M86171 Other acute osteomyelitis, right ankle and foot: Secondary | ICD-10-CM | POA: Diagnosis not present

## 2022-02-17 DIAGNOSIS — I2510 Atherosclerotic heart disease of native coronary artery without angina pectoris: Secondary | ICD-10-CM | POA: Diagnosis not present

## 2022-02-17 DIAGNOSIS — D696 Thrombocytopenia, unspecified: Secondary | ICD-10-CM | POA: Diagnosis not present

## 2022-02-17 DIAGNOSIS — Z7984 Long term (current) use of oral hypoglycemic drugs: Secondary | ICD-10-CM | POA: Diagnosis not present

## 2022-02-17 DIAGNOSIS — E1140 Type 2 diabetes mellitus with diabetic neuropathy, unspecified: Secondary | ICD-10-CM | POA: Diagnosis not present

## 2022-02-17 DIAGNOSIS — K219 Gastro-esophageal reflux disease without esophagitis: Secondary | ICD-10-CM | POA: Diagnosis not present

## 2022-02-17 NOTE — Patient Outreach (Signed)
[facility]) Care Management ? ?02/17/2022 ? ?Edward Rocha Hessie Diener. ?[birth year] ?[id number] ? ? ?Transition of Care ?Unsuccessful outreach  ? ?RN able to leave a HIPAA approved voice message requesting a call back for ongoing transition of care follow up calls as requested to completed enrollment today., ? ?RN will rescheduled another outreach over the week for [facility] services. ? ?Raina Mina, RN ?Care Management Coordinator ?[place] ?Main Office [phone]  ?

## 2022-02-20 DIAGNOSIS — L89610 Pressure ulcer of right heel, unstageable: Secondary | ICD-10-CM | POA: Diagnosis not present

## 2022-02-20 DIAGNOSIS — D696 Thrombocytopenia, unspecified: Secondary | ICD-10-CM | POA: Diagnosis not present

## 2022-02-20 DIAGNOSIS — I2510 Atherosclerotic heart disease of native coronary artery without angina pectoris: Secondary | ICD-10-CM | POA: Diagnosis not present

## 2022-02-20 DIAGNOSIS — E039 Hypothyroidism, unspecified: Secondary | ICD-10-CM | POA: Diagnosis not present

## 2022-02-20 DIAGNOSIS — E1151 Type 2 diabetes mellitus with diabetic peripheral angiopathy without gangrene: Secondary | ICD-10-CM | POA: Diagnosis not present

## 2022-02-20 DIAGNOSIS — K219 Gastro-esophageal reflux disease without esophagitis: Secondary | ICD-10-CM | POA: Diagnosis not present

## 2022-02-20 DIAGNOSIS — N1832 Chronic kidney disease, stage 3b: Secondary | ICD-10-CM | POA: Diagnosis not present

## 2022-02-20 DIAGNOSIS — Z7984 Long term (current) use of oral hypoglycemic drugs: Secondary | ICD-10-CM | POA: Diagnosis not present

## 2022-02-20 DIAGNOSIS — E1140 Type 2 diabetes mellitus with diabetic neuropathy, unspecified: Secondary | ICD-10-CM | POA: Diagnosis not present

## 2022-02-20 DIAGNOSIS — Z7982 Long term (current) use of aspirin: Secondary | ICD-10-CM | POA: Diagnosis not present

## 2022-02-20 DIAGNOSIS — S91301D Unspecified open wound, right foot, subsequent encounter: Secondary | ICD-10-CM | POA: Diagnosis not present

## 2022-02-20 DIAGNOSIS — M86171 Other acute osteomyelitis, right ankle and foot: Secondary | ICD-10-CM | POA: Diagnosis not present

## 2022-02-20 DIAGNOSIS — K7460 Unspecified cirrhosis of liver: Secondary | ICD-10-CM | POA: Diagnosis not present

## 2022-02-20 DIAGNOSIS — I509 Heart failure, unspecified: Secondary | ICD-10-CM | POA: Diagnosis not present

## 2022-02-20 DIAGNOSIS — E1122 Type 2 diabetes mellitus with diabetic chronic kidney disease: Secondary | ICD-10-CM | POA: Diagnosis not present

## 2022-02-20 DIAGNOSIS — Z7902 Long term (current) use of antithrombotics/antiplatelets: Secondary | ICD-10-CM | POA: Diagnosis not present

## 2022-02-20 DIAGNOSIS — I130 Hypertensive heart and chronic kidney disease with heart failure and stage 1 through stage 4 chronic kidney disease, or unspecified chronic kidney disease: Secondary | ICD-10-CM | POA: Diagnosis not present

## 2022-02-22 DIAGNOSIS — Z7982 Long term (current) use of aspirin: Secondary | ICD-10-CM | POA: Diagnosis not present

## 2022-02-22 DIAGNOSIS — M86171 Other acute osteomyelitis, right ankle and foot: Secondary | ICD-10-CM | POA: Diagnosis not present

## 2022-02-22 DIAGNOSIS — D696 Thrombocytopenia, unspecified: Secondary | ICD-10-CM | POA: Diagnosis not present

## 2022-02-22 DIAGNOSIS — N1832 Chronic kidney disease, stage 3b: Secondary | ICD-10-CM | POA: Diagnosis not present

## 2022-02-22 DIAGNOSIS — I130 Hypertensive heart and chronic kidney disease with heart failure and stage 1 through stage 4 chronic kidney disease, or unspecified chronic kidney disease: Secondary | ICD-10-CM | POA: Diagnosis not present

## 2022-02-22 DIAGNOSIS — Z7984 Long term (current) use of oral hypoglycemic drugs: Secondary | ICD-10-CM | POA: Diagnosis not present

## 2022-02-22 DIAGNOSIS — E039 Hypothyroidism, unspecified: Secondary | ICD-10-CM | POA: Diagnosis not present

## 2022-02-22 DIAGNOSIS — E1140 Type 2 diabetes mellitus with diabetic neuropathy, unspecified: Secondary | ICD-10-CM | POA: Diagnosis not present

## 2022-02-22 DIAGNOSIS — K7460 Unspecified cirrhosis of liver: Secondary | ICD-10-CM | POA: Diagnosis not present

## 2022-02-22 DIAGNOSIS — I509 Heart failure, unspecified: Secondary | ICD-10-CM | POA: Diagnosis not present

## 2022-02-22 DIAGNOSIS — Z7902 Long term (current) use of antithrombotics/antiplatelets: Secondary | ICD-10-CM | POA: Diagnosis not present

## 2022-02-22 DIAGNOSIS — K219 Gastro-esophageal reflux disease without esophagitis: Secondary | ICD-10-CM | POA: Diagnosis not present

## 2022-02-22 DIAGNOSIS — L89610 Pressure ulcer of right heel, unstageable: Secondary | ICD-10-CM | POA: Diagnosis not present

## 2022-02-22 DIAGNOSIS — E1122 Type 2 diabetes mellitus with diabetic chronic kidney disease: Secondary | ICD-10-CM | POA: Diagnosis not present

## 2022-02-22 DIAGNOSIS — S91301D Unspecified open wound, right foot, subsequent encounter: Secondary | ICD-10-CM | POA: Diagnosis not present

## 2022-02-22 DIAGNOSIS — E1151 Type 2 diabetes mellitus with diabetic peripheral angiopathy without gangrene: Secondary | ICD-10-CM | POA: Diagnosis not present

## 2022-02-22 DIAGNOSIS — I2510 Atherosclerotic heart disease of native coronary artery without angina pectoris: Secondary | ICD-10-CM | POA: Diagnosis not present

## 2022-02-24 ENCOUNTER — Other Ambulatory Visit: Payer: Self-pay | Admitting: *Deleted

## 2022-02-24 DIAGNOSIS — E1140 Type 2 diabetes mellitus with diabetic neuropathy, unspecified: Secondary | ICD-10-CM | POA: Diagnosis not present

## 2022-02-24 DIAGNOSIS — S91301D Unspecified open wound, right foot, subsequent encounter: Secondary | ICD-10-CM | POA: Diagnosis not present

## 2022-02-24 DIAGNOSIS — Z7982 Long term (current) use of aspirin: Secondary | ICD-10-CM | POA: Diagnosis not present

## 2022-02-24 DIAGNOSIS — E1151 Type 2 diabetes mellitus with diabetic peripheral angiopathy without gangrene: Secondary | ICD-10-CM | POA: Diagnosis not present

## 2022-02-24 DIAGNOSIS — K7460 Unspecified cirrhosis of liver: Secondary | ICD-10-CM | POA: Diagnosis not present

## 2022-02-24 DIAGNOSIS — E1122 Type 2 diabetes mellitus with diabetic chronic kidney disease: Secondary | ICD-10-CM | POA: Diagnosis not present

## 2022-02-24 DIAGNOSIS — D696 Thrombocytopenia, unspecified: Secondary | ICD-10-CM | POA: Diagnosis not present

## 2022-02-24 DIAGNOSIS — E039 Hypothyroidism, unspecified: Secondary | ICD-10-CM | POA: Diagnosis not present

## 2022-02-24 DIAGNOSIS — M86171 Other acute osteomyelitis, right ankle and foot: Secondary | ICD-10-CM | POA: Diagnosis not present

## 2022-02-24 DIAGNOSIS — L89610 Pressure ulcer of right heel, unstageable: Secondary | ICD-10-CM | POA: Diagnosis not present

## 2022-02-24 DIAGNOSIS — N1832 Chronic kidney disease, stage 3b: Secondary | ICD-10-CM | POA: Diagnosis not present

## 2022-02-24 DIAGNOSIS — K219 Gastro-esophageal reflux disease without esophagitis: Secondary | ICD-10-CM | POA: Diagnosis not present

## 2022-02-24 DIAGNOSIS — Z7902 Long term (current) use of antithrombotics/antiplatelets: Secondary | ICD-10-CM | POA: Diagnosis not present

## 2022-02-24 DIAGNOSIS — I2510 Atherosclerotic heart disease of native coronary artery without angina pectoris: Secondary | ICD-10-CM | POA: Diagnosis not present

## 2022-02-24 DIAGNOSIS — I509 Heart failure, unspecified: Secondary | ICD-10-CM | POA: Diagnosis not present

## 2022-02-24 DIAGNOSIS — Z7984 Long term (current) use of oral hypoglycemic drugs: Secondary | ICD-10-CM | POA: Diagnosis not present

## 2022-02-24 DIAGNOSIS — I130 Hypertensive heart and chronic kidney disease with heart failure and stage 1 through stage 4 chronic kidney disease, or unspecified chronic kidney disease: Secondary | ICD-10-CM | POA: Diagnosis not present

## 2022-02-24 NOTE — Patient Outreach (Signed)
[facility]) Care Management ? ?02/24/2022 ? ?Edward Rocha. ?11/22/1965 ?[id number] ? ? ?Transition of care-Declined [facility] services ? ?RN attempted another outreach call has requested however pt was not interested in [facility] services.  ? ?Will update the provider and close the case based upon the pt's response. ? ?Raina Mina, RN ?Care Management Coordinator ?[place] ?Main Office [phone]  ?

## 2022-02-25 DIAGNOSIS — Z7902 Long term (current) use of antithrombotics/antiplatelets: Secondary | ICD-10-CM | POA: Diagnosis not present

## 2022-02-25 DIAGNOSIS — N1832 Chronic kidney disease, stage 3b: Secondary | ICD-10-CM | POA: Diagnosis not present

## 2022-02-25 DIAGNOSIS — D696 Thrombocytopenia, unspecified: Secondary | ICD-10-CM | POA: Diagnosis not present

## 2022-02-25 DIAGNOSIS — Z7982 Long term (current) use of aspirin: Secondary | ICD-10-CM | POA: Diagnosis not present

## 2022-02-25 DIAGNOSIS — E1151 Type 2 diabetes mellitus with diabetic peripheral angiopathy without gangrene: Secondary | ICD-10-CM | POA: Diagnosis not present

## 2022-02-25 DIAGNOSIS — I509 Heart failure, unspecified: Secondary | ICD-10-CM | POA: Diagnosis not present

## 2022-02-25 DIAGNOSIS — K7460 Unspecified cirrhosis of liver: Secondary | ICD-10-CM | POA: Diagnosis not present

## 2022-02-25 DIAGNOSIS — Z7984 Long term (current) use of oral hypoglycemic drugs: Secondary | ICD-10-CM | POA: Diagnosis not present

## 2022-02-25 DIAGNOSIS — I2510 Atherosclerotic heart disease of native coronary artery without angina pectoris: Secondary | ICD-10-CM | POA: Diagnosis not present

## 2022-02-25 DIAGNOSIS — M86171 Other acute osteomyelitis, right ankle and foot: Secondary | ICD-10-CM | POA: Diagnosis not present

## 2022-02-25 DIAGNOSIS — K219 Gastro-esophageal reflux disease without esophagitis: Secondary | ICD-10-CM | POA: Diagnosis not present

## 2022-02-25 DIAGNOSIS — E1122 Type 2 diabetes mellitus with diabetic chronic kidney disease: Secondary | ICD-10-CM | POA: Diagnosis not present

## 2022-02-25 DIAGNOSIS — I130 Hypertensive heart and chronic kidney disease with heart failure and stage 1 through stage 4 chronic kidney disease, or unspecified chronic kidney disease: Secondary | ICD-10-CM | POA: Diagnosis not present

## 2022-02-25 DIAGNOSIS — E1140 Type 2 diabetes mellitus with diabetic neuropathy, unspecified: Secondary | ICD-10-CM | POA: Diagnosis not present

## 2022-02-25 DIAGNOSIS — S91301D Unspecified open wound, right foot, subsequent encounter: Secondary | ICD-10-CM | POA: Diagnosis not present

## 2022-02-25 DIAGNOSIS — E039 Hypothyroidism, unspecified: Secondary | ICD-10-CM | POA: Diagnosis not present

## 2022-02-25 DIAGNOSIS — L89610 Pressure ulcer of right heel, unstageable: Secondary | ICD-10-CM | POA: Diagnosis not present

## 2022-02-28 DIAGNOSIS — G8929 Other chronic pain: Secondary | ICD-10-CM | POA: Diagnosis not present

## 2022-02-28 DIAGNOSIS — M79661 Pain in right lower leg: Secondary | ICD-10-CM | POA: Diagnosis not present

## 2022-02-28 DIAGNOSIS — Z5181 Encounter for therapeutic drug level monitoring: Secondary | ICD-10-CM | POA: Diagnosis not present

## 2022-02-28 DIAGNOSIS — Z955 Presence of coronary angioplasty implant and graft: Secondary | ICD-10-CM | POA: Diagnosis not present

## 2022-02-28 DIAGNOSIS — E039 Hypothyroidism, unspecified: Secondary | ICD-10-CM | POA: Diagnosis not present

## 2022-02-28 DIAGNOSIS — N183 Chronic kidney disease, stage 3 unspecified: Secondary | ICD-10-CM | POA: Diagnosis not present

## 2022-02-28 DIAGNOSIS — E1151 Type 2 diabetes mellitus with diabetic peripheral angiopathy without gangrene: Secondary | ICD-10-CM | POA: Diagnosis not present

## 2022-02-28 DIAGNOSIS — I130 Hypertensive heart and chronic kidney disease with heart failure and stage 1 through stage 4 chronic kidney disease, or unspecified chronic kidney disease: Secondary | ICD-10-CM | POA: Diagnosis not present

## 2022-02-28 DIAGNOSIS — K219 Gastro-esophageal reflux disease without esophagitis: Secondary | ICD-10-CM | POA: Diagnosis not present

## 2022-02-28 DIAGNOSIS — I272 Pulmonary hypertension, unspecified: Secondary | ICD-10-CM | POA: Diagnosis not present

## 2022-02-28 DIAGNOSIS — I2510 Atherosclerotic heart disease of native coronary artery without angina pectoris: Secondary | ICD-10-CM | POA: Diagnosis not present

## 2022-02-28 DIAGNOSIS — M79671 Pain in right foot: Secondary | ICD-10-CM | POA: Diagnosis not present

## 2022-02-28 DIAGNOSIS — I5022 Chronic systolic (congestive) heart failure: Secondary | ICD-10-CM | POA: Diagnosis not present

## 2022-02-28 DIAGNOSIS — E1122 Type 2 diabetes mellitus with diabetic chronic kidney disease: Secondary | ICD-10-CM | POA: Diagnosis not present

## 2022-02-28 DIAGNOSIS — I739 Peripheral vascular disease, unspecified: Secondary | ICD-10-CM | POA: Diagnosis not present

## 2022-02-28 DIAGNOSIS — Z87891 Personal history of nicotine dependence: Secondary | ICD-10-CM | POA: Diagnosis not present

## 2022-02-28 DIAGNOSIS — E1140 Type 2 diabetes mellitus with diabetic neuropathy, unspecified: Secondary | ICD-10-CM | POA: Diagnosis not present

## 2022-02-28 DIAGNOSIS — G8918 Other acute postprocedural pain: Secondary | ICD-10-CM | POA: Diagnosis not present

## 2022-02-28 DIAGNOSIS — Z79899 Other long term (current) drug therapy: Secondary | ICD-10-CM | POA: Diagnosis not present

## 2022-02-28 DIAGNOSIS — I7389 Other specified peripheral vascular diseases: Secondary | ICD-10-CM | POA: Diagnosis not present

## 2022-03-03 DIAGNOSIS — I509 Heart failure, unspecified: Secondary | ICD-10-CM | POA: Diagnosis not present

## 2022-03-03 DIAGNOSIS — I739 Peripheral vascular disease, unspecified: Secondary | ICD-10-CM | POA: Diagnosis not present

## 2022-03-03 DIAGNOSIS — M86171 Other acute osteomyelitis, right ankle and foot: Secondary | ICD-10-CM | POA: Diagnosis not present

## 2022-03-03 DIAGNOSIS — S91301A Unspecified open wound, right foot, initial encounter: Secondary | ICD-10-CM | POA: Diagnosis not present

## 2022-03-03 DIAGNOSIS — S91301D Unspecified open wound, right foot, subsequent encounter: Secondary | ICD-10-CM | POA: Diagnosis not present

## 2022-03-06 DIAGNOSIS — E1151 Type 2 diabetes mellitus with diabetic peripheral angiopathy without gangrene: Secondary | ICD-10-CM | POA: Diagnosis not present

## 2022-03-06 DIAGNOSIS — E039 Hypothyroidism, unspecified: Secondary | ICD-10-CM | POA: Diagnosis not present

## 2022-03-06 DIAGNOSIS — S91301D Unspecified open wound, right foot, subsequent encounter: Secondary | ICD-10-CM | POA: Diagnosis not present

## 2022-03-06 DIAGNOSIS — Z7982 Long term (current) use of aspirin: Secondary | ICD-10-CM | POA: Diagnosis not present

## 2022-03-06 DIAGNOSIS — K7460 Unspecified cirrhosis of liver: Secondary | ICD-10-CM | POA: Diagnosis not present

## 2022-03-06 DIAGNOSIS — L89610 Pressure ulcer of right heel, unstageable: Secondary | ICD-10-CM | POA: Diagnosis not present

## 2022-03-06 DIAGNOSIS — Z7902 Long term (current) use of antithrombotics/antiplatelets: Secondary | ICD-10-CM | POA: Diagnosis not present

## 2022-03-06 DIAGNOSIS — E1122 Type 2 diabetes mellitus with diabetic chronic kidney disease: Secondary | ICD-10-CM | POA: Diagnosis not present

## 2022-03-06 DIAGNOSIS — I509 Heart failure, unspecified: Secondary | ICD-10-CM | POA: Diagnosis not present

## 2022-03-06 DIAGNOSIS — I2510 Atherosclerotic heart disease of native coronary artery without angina pectoris: Secondary | ICD-10-CM | POA: Diagnosis not present

## 2022-03-06 DIAGNOSIS — S91301A Unspecified open wound, right foot, initial encounter: Secondary | ICD-10-CM | POA: Diagnosis not present

## 2022-03-06 DIAGNOSIS — D696 Thrombocytopenia, unspecified: Secondary | ICD-10-CM | POA: Diagnosis not present

## 2022-03-06 DIAGNOSIS — E1140 Type 2 diabetes mellitus with diabetic neuropathy, unspecified: Secondary | ICD-10-CM | POA: Diagnosis not present

## 2022-03-06 DIAGNOSIS — R000 Tachycardia, unspecified: Secondary | ICD-10-CM | POA: Diagnosis not present

## 2022-03-06 DIAGNOSIS — Z7984 Long term (current) use of oral hypoglycemic drugs: Secondary | ICD-10-CM | POA: Diagnosis not present

## 2022-03-06 DIAGNOSIS — I130 Hypertensive heart and chronic kidney disease with heart failure and stage 1 through stage 4 chronic kidney disease, or unspecified chronic kidney disease: Secondary | ICD-10-CM | POA: Diagnosis not present

## 2022-03-06 DIAGNOSIS — K219 Gastro-esophageal reflux disease without esophagitis: Secondary | ICD-10-CM | POA: Diagnosis not present

## 2022-03-06 DIAGNOSIS — N1832 Chronic kidney disease, stage 3b: Secondary | ICD-10-CM | POA: Diagnosis not present

## 2022-03-06 DIAGNOSIS — M86171 Other acute osteomyelitis, right ankle and foot: Secondary | ICD-10-CM | POA: Diagnosis not present

## 2022-03-08 DIAGNOSIS — Z7902 Long term (current) use of antithrombotics/antiplatelets: Secondary | ICD-10-CM | POA: Diagnosis not present

## 2022-03-08 DIAGNOSIS — N1832 Chronic kidney disease, stage 3b: Secondary | ICD-10-CM | POA: Diagnosis not present

## 2022-03-08 DIAGNOSIS — E1122 Type 2 diabetes mellitus with diabetic chronic kidney disease: Secondary | ICD-10-CM | POA: Diagnosis not present

## 2022-03-08 DIAGNOSIS — I509 Heart failure, unspecified: Secondary | ICD-10-CM | POA: Diagnosis not present

## 2022-03-08 DIAGNOSIS — E1140 Type 2 diabetes mellitus with diabetic neuropathy, unspecified: Secondary | ICD-10-CM | POA: Diagnosis not present

## 2022-03-08 DIAGNOSIS — Z7984 Long term (current) use of oral hypoglycemic drugs: Secondary | ICD-10-CM | POA: Diagnosis not present

## 2022-03-08 DIAGNOSIS — M86171 Other acute osteomyelitis, right ankle and foot: Secondary | ICD-10-CM | POA: Diagnosis not present

## 2022-03-08 DIAGNOSIS — K219 Gastro-esophageal reflux disease without esophagitis: Secondary | ICD-10-CM | POA: Diagnosis not present

## 2022-03-08 DIAGNOSIS — E039 Hypothyroidism, unspecified: Secondary | ICD-10-CM | POA: Diagnosis not present

## 2022-03-08 DIAGNOSIS — L89610 Pressure ulcer of right heel, unstageable: Secondary | ICD-10-CM | POA: Diagnosis not present

## 2022-03-08 DIAGNOSIS — I2510 Atherosclerotic heart disease of native coronary artery without angina pectoris: Secondary | ICD-10-CM | POA: Diagnosis not present

## 2022-03-08 DIAGNOSIS — S91301D Unspecified open wound, right foot, subsequent encounter: Secondary | ICD-10-CM | POA: Diagnosis not present

## 2022-03-08 DIAGNOSIS — K7460 Unspecified cirrhosis of liver: Secondary | ICD-10-CM | POA: Diagnosis not present

## 2022-03-08 DIAGNOSIS — Z7982 Long term (current) use of aspirin: Secondary | ICD-10-CM | POA: Diagnosis not present

## 2022-03-08 DIAGNOSIS — E1151 Type 2 diabetes mellitus with diabetic peripheral angiopathy without gangrene: Secondary | ICD-10-CM | POA: Diagnosis not present

## 2022-03-08 DIAGNOSIS — D696 Thrombocytopenia, unspecified: Secondary | ICD-10-CM | POA: Diagnosis not present

## 2022-03-08 DIAGNOSIS — I130 Hypertensive heart and chronic kidney disease with heart failure and stage 1 through stage 4 chronic kidney disease, or unspecified chronic kidney disease: Secondary | ICD-10-CM | POA: Diagnosis not present

## 2022-03-10 DIAGNOSIS — Z7902 Long term (current) use of antithrombotics/antiplatelets: Secondary | ICD-10-CM | POA: Diagnosis not present

## 2022-03-10 DIAGNOSIS — I130 Hypertensive heart and chronic kidney disease with heart failure and stage 1 through stage 4 chronic kidney disease, or unspecified chronic kidney disease: Secondary | ICD-10-CM | POA: Diagnosis not present

## 2022-03-10 DIAGNOSIS — I509 Heart failure, unspecified: Secondary | ICD-10-CM | POA: Diagnosis not present

## 2022-03-10 DIAGNOSIS — K219 Gastro-esophageal reflux disease without esophagitis: Secondary | ICD-10-CM | POA: Diagnosis not present

## 2022-03-10 DIAGNOSIS — Z7982 Long term (current) use of aspirin: Secondary | ICD-10-CM | POA: Diagnosis not present

## 2022-03-10 DIAGNOSIS — D696 Thrombocytopenia, unspecified: Secondary | ICD-10-CM | POA: Diagnosis not present

## 2022-03-10 DIAGNOSIS — Z7984 Long term (current) use of oral hypoglycemic drugs: Secondary | ICD-10-CM | POA: Diagnosis not present

## 2022-03-10 DIAGNOSIS — E1140 Type 2 diabetes mellitus with diabetic neuropathy, unspecified: Secondary | ICD-10-CM | POA: Diagnosis not present

## 2022-03-10 DIAGNOSIS — I2510 Atherosclerotic heart disease of native coronary artery without angina pectoris: Secondary | ICD-10-CM | POA: Diagnosis not present

## 2022-03-10 DIAGNOSIS — N1832 Chronic kidney disease, stage 3b: Secondary | ICD-10-CM | POA: Diagnosis not present

## 2022-03-10 DIAGNOSIS — K7460 Unspecified cirrhosis of liver: Secondary | ICD-10-CM | POA: Diagnosis not present

## 2022-03-10 DIAGNOSIS — M86171 Other acute osteomyelitis, right ankle and foot: Secondary | ICD-10-CM | POA: Diagnosis not present

## 2022-03-10 DIAGNOSIS — S91301D Unspecified open wound, right foot, subsequent encounter: Secondary | ICD-10-CM | POA: Diagnosis not present

## 2022-03-10 DIAGNOSIS — E039 Hypothyroidism, unspecified: Secondary | ICD-10-CM | POA: Diagnosis not present

## 2022-03-10 DIAGNOSIS — E1122 Type 2 diabetes mellitus with diabetic chronic kidney disease: Secondary | ICD-10-CM | POA: Diagnosis not present

## 2022-03-10 DIAGNOSIS — L89610 Pressure ulcer of right heel, unstageable: Secondary | ICD-10-CM | POA: Diagnosis not present

## 2022-03-10 DIAGNOSIS — E1151 Type 2 diabetes mellitus with diabetic peripheral angiopathy without gangrene: Secondary | ICD-10-CM | POA: Diagnosis not present

## 2022-03-13 DIAGNOSIS — I509 Heart failure, unspecified: Secondary | ICD-10-CM | POA: Diagnosis not present

## 2022-03-13 DIAGNOSIS — K219 Gastro-esophageal reflux disease without esophagitis: Secondary | ICD-10-CM | POA: Diagnosis not present

## 2022-03-13 DIAGNOSIS — I2510 Atherosclerotic heart disease of native coronary artery without angina pectoris: Secondary | ICD-10-CM | POA: Diagnosis not present

## 2022-03-13 DIAGNOSIS — Z7982 Long term (current) use of aspirin: Secondary | ICD-10-CM | POA: Diagnosis not present

## 2022-03-13 DIAGNOSIS — E1151 Type 2 diabetes mellitus with diabetic peripheral angiopathy without gangrene: Secondary | ICD-10-CM | POA: Diagnosis not present

## 2022-03-13 DIAGNOSIS — S91301D Unspecified open wound, right foot, subsequent encounter: Secondary | ICD-10-CM | POA: Diagnosis not present

## 2022-03-13 DIAGNOSIS — M86171 Other acute osteomyelitis, right ankle and foot: Secondary | ICD-10-CM | POA: Diagnosis not present

## 2022-03-13 DIAGNOSIS — Z7902 Long term (current) use of antithrombotics/antiplatelets: Secondary | ICD-10-CM | POA: Diagnosis not present

## 2022-03-13 DIAGNOSIS — K7460 Unspecified cirrhosis of liver: Secondary | ICD-10-CM | POA: Diagnosis not present

## 2022-03-13 DIAGNOSIS — E1122 Type 2 diabetes mellitus with diabetic chronic kidney disease: Secondary | ICD-10-CM | POA: Diagnosis not present

## 2022-03-13 DIAGNOSIS — N1832 Chronic kidney disease, stage 3b: Secondary | ICD-10-CM | POA: Diagnosis not present

## 2022-03-13 DIAGNOSIS — Z7984 Long term (current) use of oral hypoglycemic drugs: Secondary | ICD-10-CM | POA: Diagnosis not present

## 2022-03-13 DIAGNOSIS — E1140 Type 2 diabetes mellitus with diabetic neuropathy, unspecified: Secondary | ICD-10-CM | POA: Diagnosis not present

## 2022-03-13 DIAGNOSIS — L89610 Pressure ulcer of right heel, unstageable: Secondary | ICD-10-CM | POA: Diagnosis not present

## 2022-03-13 DIAGNOSIS — D696 Thrombocytopenia, unspecified: Secondary | ICD-10-CM | POA: Diagnosis not present

## 2022-03-13 DIAGNOSIS — E039 Hypothyroidism, unspecified: Secondary | ICD-10-CM | POA: Diagnosis not present

## 2022-03-13 DIAGNOSIS — I130 Hypertensive heart and chronic kidney disease with heart failure and stage 1 through stage 4 chronic kidney disease, or unspecified chronic kidney disease: Secondary | ICD-10-CM | POA: Diagnosis not present

## 2022-03-15 DIAGNOSIS — L89610 Pressure ulcer of right heel, unstageable: Secondary | ICD-10-CM | POA: Diagnosis not present

## 2022-03-15 DIAGNOSIS — E1151 Type 2 diabetes mellitus with diabetic peripheral angiopathy without gangrene: Secondary | ICD-10-CM | POA: Diagnosis not present

## 2022-03-15 DIAGNOSIS — N1832 Chronic kidney disease, stage 3b: Secondary | ICD-10-CM | POA: Diagnosis not present

## 2022-03-15 DIAGNOSIS — Z7902 Long term (current) use of antithrombotics/antiplatelets: Secondary | ICD-10-CM | POA: Diagnosis not present

## 2022-03-15 DIAGNOSIS — S91301D Unspecified open wound, right foot, subsequent encounter: Secondary | ICD-10-CM | POA: Diagnosis not present

## 2022-03-15 DIAGNOSIS — E1140 Type 2 diabetes mellitus with diabetic neuropathy, unspecified: Secondary | ICD-10-CM | POA: Diagnosis not present

## 2022-03-15 DIAGNOSIS — E039 Hypothyroidism, unspecified: Secondary | ICD-10-CM | POA: Diagnosis not present

## 2022-03-15 DIAGNOSIS — K219 Gastro-esophageal reflux disease without esophagitis: Secondary | ICD-10-CM | POA: Diagnosis not present

## 2022-03-15 DIAGNOSIS — D696 Thrombocytopenia, unspecified: Secondary | ICD-10-CM | POA: Diagnosis not present

## 2022-03-15 DIAGNOSIS — Z7982 Long term (current) use of aspirin: Secondary | ICD-10-CM | POA: Diagnosis not present

## 2022-03-15 DIAGNOSIS — E1122 Type 2 diabetes mellitus with diabetic chronic kidney disease: Secondary | ICD-10-CM | POA: Diagnosis not present

## 2022-03-15 DIAGNOSIS — Z7984 Long term (current) use of oral hypoglycemic drugs: Secondary | ICD-10-CM | POA: Diagnosis not present

## 2022-03-15 DIAGNOSIS — I509 Heart failure, unspecified: Secondary | ICD-10-CM | POA: Diagnosis not present

## 2022-03-15 DIAGNOSIS — I2510 Atherosclerotic heart disease of native coronary artery without angina pectoris: Secondary | ICD-10-CM | POA: Diagnosis not present

## 2022-03-15 DIAGNOSIS — M86171 Other acute osteomyelitis, right ankle and foot: Secondary | ICD-10-CM | POA: Diagnosis not present

## 2022-03-15 DIAGNOSIS — I130 Hypertensive heart and chronic kidney disease with heart failure and stage 1 through stage 4 chronic kidney disease, or unspecified chronic kidney disease: Secondary | ICD-10-CM | POA: Diagnosis not present

## 2022-03-15 DIAGNOSIS — K7460 Unspecified cirrhosis of liver: Secondary | ICD-10-CM | POA: Diagnosis not present

## 2022-03-17 DIAGNOSIS — S91301A Unspecified open wound, right foot, initial encounter: Secondary | ICD-10-CM | POA: Diagnosis not present

## 2022-03-20 DIAGNOSIS — I509 Heart failure, unspecified: Secondary | ICD-10-CM | POA: Diagnosis not present

## 2022-03-20 DIAGNOSIS — Z7902 Long term (current) use of antithrombotics/antiplatelets: Secondary | ICD-10-CM | POA: Diagnosis not present

## 2022-03-20 DIAGNOSIS — Z7982 Long term (current) use of aspirin: Secondary | ICD-10-CM | POA: Diagnosis not present

## 2022-03-20 DIAGNOSIS — I130 Hypertensive heart and chronic kidney disease with heart failure and stage 1 through stage 4 chronic kidney disease, or unspecified chronic kidney disease: Secondary | ICD-10-CM | POA: Diagnosis not present

## 2022-03-20 DIAGNOSIS — Z7984 Long term (current) use of oral hypoglycemic drugs: Secondary | ICD-10-CM | POA: Diagnosis not present

## 2022-03-20 DIAGNOSIS — E1151 Type 2 diabetes mellitus with diabetic peripheral angiopathy without gangrene: Secondary | ICD-10-CM | POA: Diagnosis not present

## 2022-03-20 DIAGNOSIS — K219 Gastro-esophageal reflux disease without esophagitis: Secondary | ICD-10-CM | POA: Diagnosis not present

## 2022-03-20 DIAGNOSIS — E1122 Type 2 diabetes mellitus with diabetic chronic kidney disease: Secondary | ICD-10-CM | POA: Diagnosis not present

## 2022-03-20 DIAGNOSIS — I2510 Atherosclerotic heart disease of native coronary artery without angina pectoris: Secondary | ICD-10-CM | POA: Diagnosis not present

## 2022-03-20 DIAGNOSIS — K7460 Unspecified cirrhosis of liver: Secondary | ICD-10-CM | POA: Diagnosis not present

## 2022-03-20 DIAGNOSIS — N1832 Chronic kidney disease, stage 3b: Secondary | ICD-10-CM | POA: Diagnosis not present

## 2022-03-20 DIAGNOSIS — E1140 Type 2 diabetes mellitus with diabetic neuropathy, unspecified: Secondary | ICD-10-CM | POA: Diagnosis not present

## 2022-03-20 DIAGNOSIS — S91301D Unspecified open wound, right foot, subsequent encounter: Secondary | ICD-10-CM | POA: Diagnosis not present

## 2022-03-20 DIAGNOSIS — M86171 Other acute osteomyelitis, right ankle and foot: Secondary | ICD-10-CM | POA: Diagnosis not present

## 2022-03-20 DIAGNOSIS — E039 Hypothyroidism, unspecified: Secondary | ICD-10-CM | POA: Diagnosis not present

## 2022-03-20 DIAGNOSIS — L89610 Pressure ulcer of right heel, unstageable: Secondary | ICD-10-CM | POA: Diagnosis not present

## 2022-03-20 DIAGNOSIS — D696 Thrombocytopenia, unspecified: Secondary | ICD-10-CM | POA: Diagnosis not present

## 2022-03-21 ENCOUNTER — Other Ambulatory Visit: Payer: Self-pay | Admitting: Cardiology

## 2022-03-21 NOTE — Telephone Encounter (Signed)
Left message to return call - need to clarify Lasix dose.

## 2022-03-21 NOTE — Telephone Encounter (Signed)
Spoke with wife - clarified that he is on Lasix 80mg  daily.  Refill done.

## 2022-03-22 DIAGNOSIS — K7460 Unspecified cirrhosis of liver: Secondary | ICD-10-CM | POA: Diagnosis not present

## 2022-03-22 DIAGNOSIS — E1122 Type 2 diabetes mellitus with diabetic chronic kidney disease: Secondary | ICD-10-CM | POA: Diagnosis not present

## 2022-03-22 DIAGNOSIS — E1140 Type 2 diabetes mellitus with diabetic neuropathy, unspecified: Secondary | ICD-10-CM | POA: Diagnosis not present

## 2022-03-22 DIAGNOSIS — I130 Hypertensive heart and chronic kidney disease with heart failure and stage 1 through stage 4 chronic kidney disease, or unspecified chronic kidney disease: Secondary | ICD-10-CM | POA: Diagnosis not present

## 2022-03-22 DIAGNOSIS — Z7902 Long term (current) use of antithrombotics/antiplatelets: Secondary | ICD-10-CM | POA: Diagnosis not present

## 2022-03-22 DIAGNOSIS — Z7982 Long term (current) use of aspirin: Secondary | ICD-10-CM | POA: Diagnosis not present

## 2022-03-22 DIAGNOSIS — E039 Hypothyroidism, unspecified: Secondary | ICD-10-CM | POA: Diagnosis not present

## 2022-03-22 DIAGNOSIS — D696 Thrombocytopenia, unspecified: Secondary | ICD-10-CM | POA: Diagnosis not present

## 2022-03-22 DIAGNOSIS — I2510 Atherosclerotic heart disease of native coronary artery without angina pectoris: Secondary | ICD-10-CM | POA: Diagnosis not present

## 2022-03-22 DIAGNOSIS — K219 Gastro-esophageal reflux disease without esophagitis: Secondary | ICD-10-CM | POA: Diagnosis not present

## 2022-03-22 DIAGNOSIS — M86171 Other acute osteomyelitis, right ankle and foot: Secondary | ICD-10-CM | POA: Diagnosis not present

## 2022-03-22 DIAGNOSIS — L89610 Pressure ulcer of right heel, unstageable: Secondary | ICD-10-CM | POA: Diagnosis not present

## 2022-03-22 DIAGNOSIS — I509 Heart failure, unspecified: Secondary | ICD-10-CM | POA: Diagnosis not present

## 2022-03-22 DIAGNOSIS — N1832 Chronic kidney disease, stage 3b: Secondary | ICD-10-CM | POA: Diagnosis not present

## 2022-03-22 DIAGNOSIS — E1151 Type 2 diabetes mellitus with diabetic peripheral angiopathy without gangrene: Secondary | ICD-10-CM | POA: Diagnosis not present

## 2022-03-22 DIAGNOSIS — Z7984 Long term (current) use of oral hypoglycemic drugs: Secondary | ICD-10-CM | POA: Diagnosis not present

## 2022-03-22 DIAGNOSIS — S91301D Unspecified open wound, right foot, subsequent encounter: Secondary | ICD-10-CM | POA: Diagnosis not present

## 2022-03-24 DIAGNOSIS — I509 Heart failure, unspecified: Secondary | ICD-10-CM | POA: Diagnosis not present

## 2022-03-24 DIAGNOSIS — E039 Hypothyroidism, unspecified: Secondary | ICD-10-CM | POA: Diagnosis not present

## 2022-03-24 DIAGNOSIS — Z7902 Long term (current) use of antithrombotics/antiplatelets: Secondary | ICD-10-CM | POA: Diagnosis not present

## 2022-03-24 DIAGNOSIS — S91301D Unspecified open wound, right foot, subsequent encounter: Secondary | ICD-10-CM | POA: Diagnosis not present

## 2022-03-24 DIAGNOSIS — Z7982 Long term (current) use of aspirin: Secondary | ICD-10-CM | POA: Diagnosis not present

## 2022-03-24 DIAGNOSIS — K219 Gastro-esophageal reflux disease without esophagitis: Secondary | ICD-10-CM | POA: Diagnosis not present

## 2022-03-24 DIAGNOSIS — D696 Thrombocytopenia, unspecified: Secondary | ICD-10-CM | POA: Diagnosis not present

## 2022-03-24 DIAGNOSIS — Z7984 Long term (current) use of oral hypoglycemic drugs: Secondary | ICD-10-CM | POA: Diagnosis not present

## 2022-03-24 DIAGNOSIS — E1151 Type 2 diabetes mellitus with diabetic peripheral angiopathy without gangrene: Secondary | ICD-10-CM | POA: Diagnosis not present

## 2022-03-24 DIAGNOSIS — I130 Hypertensive heart and chronic kidney disease with heart failure and stage 1 through stage 4 chronic kidney disease, or unspecified chronic kidney disease: Secondary | ICD-10-CM | POA: Diagnosis not present

## 2022-03-24 DIAGNOSIS — N1832 Chronic kidney disease, stage 3b: Secondary | ICD-10-CM | POA: Diagnosis not present

## 2022-03-24 DIAGNOSIS — E1140 Type 2 diabetes mellitus with diabetic neuropathy, unspecified: Secondary | ICD-10-CM | POA: Diagnosis not present

## 2022-03-24 DIAGNOSIS — L89610 Pressure ulcer of right heel, unstageable: Secondary | ICD-10-CM | POA: Diagnosis not present

## 2022-03-24 DIAGNOSIS — E1122 Type 2 diabetes mellitus with diabetic chronic kidney disease: Secondary | ICD-10-CM | POA: Diagnosis not present

## 2022-03-24 DIAGNOSIS — M86171 Other acute osteomyelitis, right ankle and foot: Secondary | ICD-10-CM | POA: Diagnosis not present

## 2022-03-24 DIAGNOSIS — K7460 Unspecified cirrhosis of liver: Secondary | ICD-10-CM | POA: Diagnosis not present

## 2022-03-24 DIAGNOSIS — I2510 Atherosclerotic heart disease of native coronary artery without angina pectoris: Secondary | ICD-10-CM | POA: Diagnosis not present

## 2022-03-28 DIAGNOSIS — K219 Gastro-esophageal reflux disease without esophagitis: Secondary | ICD-10-CM | POA: Diagnosis not present

## 2022-03-28 DIAGNOSIS — Z7902 Long term (current) use of antithrombotics/antiplatelets: Secondary | ICD-10-CM | POA: Diagnosis not present

## 2022-03-28 DIAGNOSIS — M86171 Other acute osteomyelitis, right ankle and foot: Secondary | ICD-10-CM | POA: Diagnosis not present

## 2022-03-28 DIAGNOSIS — D696 Thrombocytopenia, unspecified: Secondary | ICD-10-CM | POA: Diagnosis not present

## 2022-03-28 DIAGNOSIS — S91301D Unspecified open wound, right foot, subsequent encounter: Secondary | ICD-10-CM | POA: Diagnosis not present

## 2022-03-28 DIAGNOSIS — I509 Heart failure, unspecified: Secondary | ICD-10-CM | POA: Diagnosis not present

## 2022-03-28 DIAGNOSIS — Z7982 Long term (current) use of aspirin: Secondary | ICD-10-CM | POA: Diagnosis not present

## 2022-03-28 DIAGNOSIS — E039 Hypothyroidism, unspecified: Secondary | ICD-10-CM | POA: Diagnosis not present

## 2022-03-28 DIAGNOSIS — K7460 Unspecified cirrhosis of liver: Secondary | ICD-10-CM | POA: Diagnosis not present

## 2022-03-28 DIAGNOSIS — N1832 Chronic kidney disease, stage 3b: Secondary | ICD-10-CM | POA: Diagnosis not present

## 2022-03-28 DIAGNOSIS — Z7984 Long term (current) use of oral hypoglycemic drugs: Secondary | ICD-10-CM | POA: Diagnosis not present

## 2022-03-28 DIAGNOSIS — E1151 Type 2 diabetes mellitus with diabetic peripheral angiopathy without gangrene: Secondary | ICD-10-CM | POA: Diagnosis not present

## 2022-03-28 DIAGNOSIS — L89610 Pressure ulcer of right heel, unstageable: Secondary | ICD-10-CM | POA: Diagnosis not present

## 2022-03-28 DIAGNOSIS — I130 Hypertensive heart and chronic kidney disease with heart failure and stage 1 through stage 4 chronic kidney disease, or unspecified chronic kidney disease: Secondary | ICD-10-CM | POA: Diagnosis not present

## 2022-03-28 DIAGNOSIS — E1140 Type 2 diabetes mellitus with diabetic neuropathy, unspecified: Secondary | ICD-10-CM | POA: Diagnosis not present

## 2022-03-28 DIAGNOSIS — I2510 Atherosclerotic heart disease of native coronary artery without angina pectoris: Secondary | ICD-10-CM | POA: Diagnosis not present

## 2022-03-28 DIAGNOSIS — E1122 Type 2 diabetes mellitus with diabetic chronic kidney disease: Secondary | ICD-10-CM | POA: Diagnosis not present

## 2022-03-31 DIAGNOSIS — Z7982 Long term (current) use of aspirin: Secondary | ICD-10-CM | POA: Diagnosis not present

## 2022-03-31 DIAGNOSIS — I509 Heart failure, unspecified: Secondary | ICD-10-CM | POA: Diagnosis not present

## 2022-03-31 DIAGNOSIS — Z7902 Long term (current) use of antithrombotics/antiplatelets: Secondary | ICD-10-CM | POA: Diagnosis not present

## 2022-03-31 DIAGNOSIS — L89610 Pressure ulcer of right heel, unstageable: Secondary | ICD-10-CM | POA: Diagnosis not present

## 2022-03-31 DIAGNOSIS — E1122 Type 2 diabetes mellitus with diabetic chronic kidney disease: Secondary | ICD-10-CM | POA: Diagnosis not present

## 2022-03-31 DIAGNOSIS — D696 Thrombocytopenia, unspecified: Secondary | ICD-10-CM | POA: Diagnosis not present

## 2022-03-31 DIAGNOSIS — I130 Hypertensive heart and chronic kidney disease with heart failure and stage 1 through stage 4 chronic kidney disease, or unspecified chronic kidney disease: Secondary | ICD-10-CM | POA: Diagnosis not present

## 2022-03-31 DIAGNOSIS — E1140 Type 2 diabetes mellitus with diabetic neuropathy, unspecified: Secondary | ICD-10-CM | POA: Diagnosis not present

## 2022-03-31 DIAGNOSIS — K7460 Unspecified cirrhosis of liver: Secondary | ICD-10-CM | POA: Diagnosis not present

## 2022-03-31 DIAGNOSIS — K219 Gastro-esophageal reflux disease without esophagitis: Secondary | ICD-10-CM | POA: Diagnosis not present

## 2022-03-31 DIAGNOSIS — E039 Hypothyroidism, unspecified: Secondary | ICD-10-CM | POA: Diagnosis not present

## 2022-03-31 DIAGNOSIS — I2510 Atherosclerotic heart disease of native coronary artery without angina pectoris: Secondary | ICD-10-CM | POA: Diagnosis not present

## 2022-03-31 DIAGNOSIS — M86171 Other acute osteomyelitis, right ankle and foot: Secondary | ICD-10-CM | POA: Diagnosis not present

## 2022-03-31 DIAGNOSIS — Z7984 Long term (current) use of oral hypoglycemic drugs: Secondary | ICD-10-CM | POA: Diagnosis not present

## 2022-03-31 DIAGNOSIS — N1832 Chronic kidney disease, stage 3b: Secondary | ICD-10-CM | POA: Diagnosis not present

## 2022-03-31 DIAGNOSIS — E1151 Type 2 diabetes mellitus with diabetic peripheral angiopathy without gangrene: Secondary | ICD-10-CM | POA: Diagnosis not present

## 2022-03-31 DIAGNOSIS — S91301D Unspecified open wound, right foot, subsequent encounter: Secondary | ICD-10-CM | POA: Diagnosis not present

## 2022-04-02 DIAGNOSIS — S91301A Unspecified open wound, right foot, initial encounter: Secondary | ICD-10-CM | POA: Diagnosis not present

## 2022-04-03 DIAGNOSIS — M86171 Other acute osteomyelitis, right ankle and foot: Secondary | ICD-10-CM | POA: Diagnosis not present

## 2022-04-03 DIAGNOSIS — D696 Thrombocytopenia, unspecified: Secondary | ICD-10-CM | POA: Diagnosis not present

## 2022-04-03 DIAGNOSIS — I130 Hypertensive heart and chronic kidney disease with heart failure and stage 1 through stage 4 chronic kidney disease, or unspecified chronic kidney disease: Secondary | ICD-10-CM | POA: Diagnosis not present

## 2022-04-03 DIAGNOSIS — I2510 Atherosclerotic heart disease of native coronary artery without angina pectoris: Secondary | ICD-10-CM | POA: Diagnosis not present

## 2022-04-03 DIAGNOSIS — K219 Gastro-esophageal reflux disease without esophagitis: Secondary | ICD-10-CM | POA: Diagnosis not present

## 2022-04-03 DIAGNOSIS — E1122 Type 2 diabetes mellitus with diabetic chronic kidney disease: Secondary | ICD-10-CM | POA: Diagnosis not present

## 2022-04-03 DIAGNOSIS — E1140 Type 2 diabetes mellitus with diabetic neuropathy, unspecified: Secondary | ICD-10-CM | POA: Diagnosis not present

## 2022-04-03 DIAGNOSIS — E1151 Type 2 diabetes mellitus with diabetic peripheral angiopathy without gangrene: Secondary | ICD-10-CM | POA: Diagnosis not present

## 2022-04-03 DIAGNOSIS — N1832 Chronic kidney disease, stage 3b: Secondary | ICD-10-CM | POA: Diagnosis not present

## 2022-04-03 DIAGNOSIS — L89610 Pressure ulcer of right heel, unstageable: Secondary | ICD-10-CM | POA: Diagnosis not present

## 2022-04-03 DIAGNOSIS — Z7984 Long term (current) use of oral hypoglycemic drugs: Secondary | ICD-10-CM | POA: Diagnosis not present

## 2022-04-03 DIAGNOSIS — S91301D Unspecified open wound, right foot, subsequent encounter: Secondary | ICD-10-CM | POA: Diagnosis not present

## 2022-04-03 DIAGNOSIS — K7460 Unspecified cirrhosis of liver: Secondary | ICD-10-CM | POA: Diagnosis not present

## 2022-04-03 DIAGNOSIS — E039 Hypothyroidism, unspecified: Secondary | ICD-10-CM | POA: Diagnosis not present

## 2022-04-03 DIAGNOSIS — I509 Heart failure, unspecified: Secondary | ICD-10-CM | POA: Diagnosis not present

## 2022-04-03 DIAGNOSIS — Z7982 Long term (current) use of aspirin: Secondary | ICD-10-CM | POA: Diagnosis not present

## 2022-04-03 DIAGNOSIS — Z7902 Long term (current) use of antithrombotics/antiplatelets: Secondary | ICD-10-CM | POA: Diagnosis not present

## 2022-04-05 DIAGNOSIS — E1122 Type 2 diabetes mellitus with diabetic chronic kidney disease: Secondary | ICD-10-CM | POA: Diagnosis not present

## 2022-04-05 DIAGNOSIS — E039 Hypothyroidism, unspecified: Secondary | ICD-10-CM | POA: Diagnosis not present

## 2022-04-05 DIAGNOSIS — I509 Heart failure, unspecified: Secondary | ICD-10-CM | POA: Diagnosis not present

## 2022-04-05 DIAGNOSIS — E1151 Type 2 diabetes mellitus with diabetic peripheral angiopathy without gangrene: Secondary | ICD-10-CM | POA: Diagnosis not present

## 2022-04-05 DIAGNOSIS — E1140 Type 2 diabetes mellitus with diabetic neuropathy, unspecified: Secondary | ICD-10-CM | POA: Diagnosis not present

## 2022-04-05 DIAGNOSIS — K219 Gastro-esophageal reflux disease without esophagitis: Secondary | ICD-10-CM | POA: Diagnosis not present

## 2022-04-05 DIAGNOSIS — S91301D Unspecified open wound, right foot, subsequent encounter: Secondary | ICD-10-CM | POA: Diagnosis not present

## 2022-04-05 DIAGNOSIS — Z7984 Long term (current) use of oral hypoglycemic drugs: Secondary | ICD-10-CM | POA: Diagnosis not present

## 2022-04-05 DIAGNOSIS — L89610 Pressure ulcer of right heel, unstageable: Secondary | ICD-10-CM | POA: Diagnosis not present

## 2022-04-05 DIAGNOSIS — K7460 Unspecified cirrhosis of liver: Secondary | ICD-10-CM | POA: Diagnosis not present

## 2022-04-05 DIAGNOSIS — Z7902 Long term (current) use of antithrombotics/antiplatelets: Secondary | ICD-10-CM | POA: Diagnosis not present

## 2022-04-05 DIAGNOSIS — D696 Thrombocytopenia, unspecified: Secondary | ICD-10-CM | POA: Diagnosis not present

## 2022-04-05 DIAGNOSIS — I130 Hypertensive heart and chronic kidney disease with heart failure and stage 1 through stage 4 chronic kidney disease, or unspecified chronic kidney disease: Secondary | ICD-10-CM | POA: Diagnosis not present

## 2022-04-05 DIAGNOSIS — N1832 Chronic kidney disease, stage 3b: Secondary | ICD-10-CM | POA: Diagnosis not present

## 2022-04-05 DIAGNOSIS — M86171 Other acute osteomyelitis, right ankle and foot: Secondary | ICD-10-CM | POA: Diagnosis not present

## 2022-04-05 DIAGNOSIS — I2510 Atherosclerotic heart disease of native coronary artery without angina pectoris: Secondary | ICD-10-CM | POA: Diagnosis not present

## 2022-04-05 DIAGNOSIS — Z7982 Long term (current) use of aspirin: Secondary | ICD-10-CM | POA: Diagnosis not present

## 2022-04-06 DIAGNOSIS — R000 Tachycardia, unspecified: Secondary | ICD-10-CM | POA: Diagnosis not present

## 2022-04-06 DIAGNOSIS — S91301A Unspecified open wound, right foot, initial encounter: Secondary | ICD-10-CM | POA: Diagnosis not present

## 2022-04-07 DIAGNOSIS — E039 Hypothyroidism, unspecified: Secondary | ICD-10-CM | POA: Diagnosis not present

## 2022-04-07 DIAGNOSIS — E1140 Type 2 diabetes mellitus with diabetic neuropathy, unspecified: Secondary | ICD-10-CM | POA: Diagnosis not present

## 2022-04-07 DIAGNOSIS — I509 Heart failure, unspecified: Secondary | ICD-10-CM | POA: Diagnosis not present

## 2022-04-07 DIAGNOSIS — K219 Gastro-esophageal reflux disease without esophagitis: Secondary | ICD-10-CM | POA: Diagnosis not present

## 2022-04-07 DIAGNOSIS — Z7902 Long term (current) use of antithrombotics/antiplatelets: Secondary | ICD-10-CM | POA: Diagnosis not present

## 2022-04-07 DIAGNOSIS — Z7984 Long term (current) use of oral hypoglycemic drugs: Secondary | ICD-10-CM | POA: Diagnosis not present

## 2022-04-07 DIAGNOSIS — N1832 Chronic kidney disease, stage 3b: Secondary | ICD-10-CM | POA: Diagnosis not present

## 2022-04-07 DIAGNOSIS — S91301D Unspecified open wound, right foot, subsequent encounter: Secondary | ICD-10-CM | POA: Diagnosis not present

## 2022-04-07 DIAGNOSIS — D696 Thrombocytopenia, unspecified: Secondary | ICD-10-CM | POA: Diagnosis not present

## 2022-04-07 DIAGNOSIS — I2510 Atherosclerotic heart disease of native coronary artery without angina pectoris: Secondary | ICD-10-CM | POA: Diagnosis not present

## 2022-04-07 DIAGNOSIS — L89610 Pressure ulcer of right heel, unstageable: Secondary | ICD-10-CM | POA: Diagnosis not present

## 2022-04-07 DIAGNOSIS — K7460 Unspecified cirrhosis of liver: Secondary | ICD-10-CM | POA: Diagnosis not present

## 2022-04-07 DIAGNOSIS — E1122 Type 2 diabetes mellitus with diabetic chronic kidney disease: Secondary | ICD-10-CM | POA: Diagnosis not present

## 2022-04-07 DIAGNOSIS — E1151 Type 2 diabetes mellitus with diabetic peripheral angiopathy without gangrene: Secondary | ICD-10-CM | POA: Diagnosis not present

## 2022-04-07 DIAGNOSIS — Z7982 Long term (current) use of aspirin: Secondary | ICD-10-CM | POA: Diagnosis not present

## 2022-04-07 DIAGNOSIS — I130 Hypertensive heart and chronic kidney disease with heart failure and stage 1 through stage 4 chronic kidney disease, or unspecified chronic kidney disease: Secondary | ICD-10-CM | POA: Diagnosis not present

## 2022-04-07 DIAGNOSIS — M86171 Other acute osteomyelitis, right ankle and foot: Secondary | ICD-10-CM | POA: Diagnosis not present

## 2022-04-12 DIAGNOSIS — I130 Hypertensive heart and chronic kidney disease with heart failure and stage 1 through stage 4 chronic kidney disease, or unspecified chronic kidney disease: Secondary | ICD-10-CM | POA: Diagnosis not present

## 2022-04-12 DIAGNOSIS — K7460 Unspecified cirrhosis of liver: Secondary | ICD-10-CM | POA: Diagnosis not present

## 2022-04-12 DIAGNOSIS — Z7984 Long term (current) use of oral hypoglycemic drugs: Secondary | ICD-10-CM | POA: Diagnosis not present

## 2022-04-12 DIAGNOSIS — L89610 Pressure ulcer of right heel, unstageable: Secondary | ICD-10-CM | POA: Diagnosis not present

## 2022-04-12 DIAGNOSIS — K219 Gastro-esophageal reflux disease without esophagitis: Secondary | ICD-10-CM | POA: Diagnosis not present

## 2022-04-12 DIAGNOSIS — N1832 Chronic kidney disease, stage 3b: Secondary | ICD-10-CM | POA: Diagnosis not present

## 2022-04-12 DIAGNOSIS — D696 Thrombocytopenia, unspecified: Secondary | ICD-10-CM | POA: Diagnosis not present

## 2022-04-12 DIAGNOSIS — E1151 Type 2 diabetes mellitus with diabetic peripheral angiopathy without gangrene: Secondary | ICD-10-CM | POA: Diagnosis not present

## 2022-04-12 DIAGNOSIS — M86171 Other acute osteomyelitis, right ankle and foot: Secondary | ICD-10-CM | POA: Diagnosis not present

## 2022-04-12 DIAGNOSIS — E039 Hypothyroidism, unspecified: Secondary | ICD-10-CM | POA: Diagnosis not present

## 2022-04-12 DIAGNOSIS — E1140 Type 2 diabetes mellitus with diabetic neuropathy, unspecified: Secondary | ICD-10-CM | POA: Diagnosis not present

## 2022-04-12 DIAGNOSIS — Z7982 Long term (current) use of aspirin: Secondary | ICD-10-CM | POA: Diagnosis not present

## 2022-04-12 DIAGNOSIS — I509 Heart failure, unspecified: Secondary | ICD-10-CM | POA: Diagnosis not present

## 2022-04-12 DIAGNOSIS — I2510 Atherosclerotic heart disease of native coronary artery without angina pectoris: Secondary | ICD-10-CM | POA: Diagnosis not present

## 2022-04-12 DIAGNOSIS — E1122 Type 2 diabetes mellitus with diabetic chronic kidney disease: Secondary | ICD-10-CM | POA: Diagnosis not present

## 2022-04-12 DIAGNOSIS — Z7902 Long term (current) use of antithrombotics/antiplatelets: Secondary | ICD-10-CM | POA: Diagnosis not present

## 2022-04-14 DIAGNOSIS — Z7982 Long term (current) use of aspirin: Secondary | ICD-10-CM | POA: Diagnosis not present

## 2022-04-14 DIAGNOSIS — Z7984 Long term (current) use of oral hypoglycemic drugs: Secondary | ICD-10-CM | POA: Diagnosis not present

## 2022-04-14 DIAGNOSIS — I130 Hypertensive heart and chronic kidney disease with heart failure and stage 1 through stage 4 chronic kidney disease, or unspecified chronic kidney disease: Secondary | ICD-10-CM | POA: Diagnosis not present

## 2022-04-14 DIAGNOSIS — I509 Heart failure, unspecified: Secondary | ICD-10-CM | POA: Diagnosis not present

## 2022-04-14 DIAGNOSIS — E1151 Type 2 diabetes mellitus with diabetic peripheral angiopathy without gangrene: Secondary | ICD-10-CM | POA: Diagnosis not present

## 2022-04-14 DIAGNOSIS — E1140 Type 2 diabetes mellitus with diabetic neuropathy, unspecified: Secondary | ICD-10-CM | POA: Diagnosis not present

## 2022-04-14 DIAGNOSIS — I2510 Atherosclerotic heart disease of native coronary artery without angina pectoris: Secondary | ICD-10-CM | POA: Diagnosis not present

## 2022-04-14 DIAGNOSIS — K7460 Unspecified cirrhosis of liver: Secondary | ICD-10-CM | POA: Diagnosis not present

## 2022-04-14 DIAGNOSIS — Z7902 Long term (current) use of antithrombotics/antiplatelets: Secondary | ICD-10-CM | POA: Diagnosis not present

## 2022-04-14 DIAGNOSIS — K219 Gastro-esophageal reflux disease without esophagitis: Secondary | ICD-10-CM | POA: Diagnosis not present

## 2022-04-14 DIAGNOSIS — E1122 Type 2 diabetes mellitus with diabetic chronic kidney disease: Secondary | ICD-10-CM | POA: Diagnosis not present

## 2022-04-14 DIAGNOSIS — M86171 Other acute osteomyelitis, right ankle and foot: Secondary | ICD-10-CM | POA: Diagnosis not present

## 2022-04-14 DIAGNOSIS — N1832 Chronic kidney disease, stage 3b: Secondary | ICD-10-CM | POA: Diagnosis not present

## 2022-04-14 DIAGNOSIS — D696 Thrombocytopenia, unspecified: Secondary | ICD-10-CM | POA: Diagnosis not present

## 2022-04-14 DIAGNOSIS — E039 Hypothyroidism, unspecified: Secondary | ICD-10-CM | POA: Diagnosis not present

## 2022-04-14 DIAGNOSIS — L89610 Pressure ulcer of right heel, unstageable: Secondary | ICD-10-CM | POA: Diagnosis not present

## 2022-04-17 DIAGNOSIS — I130 Hypertensive heart and chronic kidney disease with heart failure and stage 1 through stage 4 chronic kidney disease, or unspecified chronic kidney disease: Secondary | ICD-10-CM | POA: Diagnosis not present

## 2022-04-17 DIAGNOSIS — E039 Hypothyroidism, unspecified: Secondary | ICD-10-CM | POA: Diagnosis not present

## 2022-04-17 DIAGNOSIS — I2510 Atherosclerotic heart disease of native coronary artery without angina pectoris: Secondary | ICD-10-CM | POA: Diagnosis not present

## 2022-04-17 DIAGNOSIS — N1832 Chronic kidney disease, stage 3b: Secondary | ICD-10-CM | POA: Diagnosis not present

## 2022-04-17 DIAGNOSIS — D696 Thrombocytopenia, unspecified: Secondary | ICD-10-CM | POA: Diagnosis not present

## 2022-04-17 DIAGNOSIS — K219 Gastro-esophageal reflux disease without esophagitis: Secondary | ICD-10-CM | POA: Diagnosis not present

## 2022-04-17 DIAGNOSIS — M86171 Other acute osteomyelitis, right ankle and foot: Secondary | ICD-10-CM | POA: Diagnosis not present

## 2022-04-17 DIAGNOSIS — Z7902 Long term (current) use of antithrombotics/antiplatelets: Secondary | ICD-10-CM | POA: Diagnosis not present

## 2022-04-17 DIAGNOSIS — L89610 Pressure ulcer of right heel, unstageable: Secondary | ICD-10-CM | POA: Diagnosis not present

## 2022-04-17 DIAGNOSIS — E1122 Type 2 diabetes mellitus with diabetic chronic kidney disease: Secondary | ICD-10-CM | POA: Diagnosis not present

## 2022-04-17 DIAGNOSIS — Z7982 Long term (current) use of aspirin: Secondary | ICD-10-CM | POA: Diagnosis not present

## 2022-04-17 DIAGNOSIS — E1151 Type 2 diabetes mellitus with diabetic peripheral angiopathy without gangrene: Secondary | ICD-10-CM | POA: Diagnosis not present

## 2022-04-17 DIAGNOSIS — I509 Heart failure, unspecified: Secondary | ICD-10-CM | POA: Diagnosis not present

## 2022-04-17 DIAGNOSIS — E1140 Type 2 diabetes mellitus with diabetic neuropathy, unspecified: Secondary | ICD-10-CM | POA: Diagnosis not present

## 2022-04-17 DIAGNOSIS — Z7984 Long term (current) use of oral hypoglycemic drugs: Secondary | ICD-10-CM | POA: Diagnosis not present

## 2022-04-17 DIAGNOSIS — K7460 Unspecified cirrhosis of liver: Secondary | ICD-10-CM | POA: Diagnosis not present

## 2022-04-19 DIAGNOSIS — E1140 Type 2 diabetes mellitus with diabetic neuropathy, unspecified: Secondary | ICD-10-CM | POA: Diagnosis not present

## 2022-04-19 DIAGNOSIS — Z7982 Long term (current) use of aspirin: Secondary | ICD-10-CM | POA: Diagnosis not present

## 2022-04-19 DIAGNOSIS — M86171 Other acute osteomyelitis, right ankle and foot: Secondary | ICD-10-CM | POA: Diagnosis not present

## 2022-04-19 DIAGNOSIS — Z7984 Long term (current) use of oral hypoglycemic drugs: Secondary | ICD-10-CM | POA: Diagnosis not present

## 2022-04-19 DIAGNOSIS — I2510 Atherosclerotic heart disease of native coronary artery without angina pectoris: Secondary | ICD-10-CM | POA: Diagnosis not present

## 2022-04-19 DIAGNOSIS — L89610 Pressure ulcer of right heel, unstageable: Secondary | ICD-10-CM | POA: Diagnosis not present

## 2022-04-19 DIAGNOSIS — N1832 Chronic kidney disease, stage 3b: Secondary | ICD-10-CM | POA: Diagnosis not present

## 2022-04-19 DIAGNOSIS — E1151 Type 2 diabetes mellitus with diabetic peripheral angiopathy without gangrene: Secondary | ICD-10-CM | POA: Diagnosis not present

## 2022-04-19 DIAGNOSIS — D696 Thrombocytopenia, unspecified: Secondary | ICD-10-CM | POA: Diagnosis not present

## 2022-04-19 DIAGNOSIS — K219 Gastro-esophageal reflux disease without esophagitis: Secondary | ICD-10-CM | POA: Diagnosis not present

## 2022-04-19 DIAGNOSIS — E1122 Type 2 diabetes mellitus with diabetic chronic kidney disease: Secondary | ICD-10-CM | POA: Diagnosis not present

## 2022-04-19 DIAGNOSIS — I130 Hypertensive heart and chronic kidney disease with heart failure and stage 1 through stage 4 chronic kidney disease, or unspecified chronic kidney disease: Secondary | ICD-10-CM | POA: Diagnosis not present

## 2022-04-19 DIAGNOSIS — K7460 Unspecified cirrhosis of liver: Secondary | ICD-10-CM | POA: Diagnosis not present

## 2022-04-19 DIAGNOSIS — E039 Hypothyroidism, unspecified: Secondary | ICD-10-CM | POA: Diagnosis not present

## 2022-04-19 DIAGNOSIS — I509 Heart failure, unspecified: Secondary | ICD-10-CM | POA: Diagnosis not present

## 2022-04-19 DIAGNOSIS — Z7902 Long term (current) use of antithrombotics/antiplatelets: Secondary | ICD-10-CM | POA: Diagnosis not present

## 2022-04-21 DIAGNOSIS — Z7902 Long term (current) use of antithrombotics/antiplatelets: Secondary | ICD-10-CM | POA: Diagnosis not present

## 2022-04-21 DIAGNOSIS — I2510 Atherosclerotic heart disease of native coronary artery without angina pectoris: Secondary | ICD-10-CM | POA: Diagnosis not present

## 2022-04-21 DIAGNOSIS — I130 Hypertensive heart and chronic kidney disease with heart failure and stage 1 through stage 4 chronic kidney disease, or unspecified chronic kidney disease: Secondary | ICD-10-CM | POA: Diagnosis not present

## 2022-04-21 DIAGNOSIS — Z7982 Long term (current) use of aspirin: Secondary | ICD-10-CM | POA: Diagnosis not present

## 2022-04-21 DIAGNOSIS — M86171 Other acute osteomyelitis, right ankle and foot: Secondary | ICD-10-CM | POA: Diagnosis not present

## 2022-04-21 DIAGNOSIS — K219 Gastro-esophageal reflux disease without esophagitis: Secondary | ICD-10-CM | POA: Diagnosis not present

## 2022-04-21 DIAGNOSIS — I509 Heart failure, unspecified: Secondary | ICD-10-CM | POA: Diagnosis not present

## 2022-04-21 DIAGNOSIS — L89610 Pressure ulcer of right heel, unstageable: Secondary | ICD-10-CM | POA: Diagnosis not present

## 2022-04-21 DIAGNOSIS — N1832 Chronic kidney disease, stage 3b: Secondary | ICD-10-CM | POA: Diagnosis not present

## 2022-04-21 DIAGNOSIS — K7460 Unspecified cirrhosis of liver: Secondary | ICD-10-CM | POA: Diagnosis not present

## 2022-04-21 DIAGNOSIS — E1140 Type 2 diabetes mellitus with diabetic neuropathy, unspecified: Secondary | ICD-10-CM | POA: Diagnosis not present

## 2022-04-21 DIAGNOSIS — E039 Hypothyroidism, unspecified: Secondary | ICD-10-CM | POA: Diagnosis not present

## 2022-04-21 DIAGNOSIS — E1151 Type 2 diabetes mellitus with diabetic peripheral angiopathy without gangrene: Secondary | ICD-10-CM | POA: Diagnosis not present

## 2022-04-21 DIAGNOSIS — D696 Thrombocytopenia, unspecified: Secondary | ICD-10-CM | POA: Diagnosis not present

## 2022-04-21 DIAGNOSIS — E1122 Type 2 diabetes mellitus with diabetic chronic kidney disease: Secondary | ICD-10-CM | POA: Diagnosis not present

## 2022-04-21 DIAGNOSIS — Z7984 Long term (current) use of oral hypoglycemic drugs: Secondary | ICD-10-CM | POA: Diagnosis not present

## 2022-04-24 DIAGNOSIS — K7460 Unspecified cirrhosis of liver: Secondary | ICD-10-CM | POA: Diagnosis not present

## 2022-04-24 DIAGNOSIS — Z7902 Long term (current) use of antithrombotics/antiplatelets: Secondary | ICD-10-CM | POA: Diagnosis not present

## 2022-04-24 DIAGNOSIS — M86171 Other acute osteomyelitis, right ankle and foot: Secondary | ICD-10-CM | POA: Diagnosis not present

## 2022-04-24 DIAGNOSIS — L89610 Pressure ulcer of right heel, unstageable: Secondary | ICD-10-CM | POA: Diagnosis not present

## 2022-04-24 DIAGNOSIS — E039 Hypothyroidism, unspecified: Secondary | ICD-10-CM | POA: Diagnosis not present

## 2022-04-24 DIAGNOSIS — N1832 Chronic kidney disease, stage 3b: Secondary | ICD-10-CM | POA: Diagnosis not present

## 2022-04-24 DIAGNOSIS — I130 Hypertensive heart and chronic kidney disease with heart failure and stage 1 through stage 4 chronic kidney disease, or unspecified chronic kidney disease: Secondary | ICD-10-CM | POA: Diagnosis not present

## 2022-04-24 DIAGNOSIS — E1140 Type 2 diabetes mellitus with diabetic neuropathy, unspecified: Secondary | ICD-10-CM | POA: Diagnosis not present

## 2022-04-24 DIAGNOSIS — Z7982 Long term (current) use of aspirin: Secondary | ICD-10-CM | POA: Diagnosis not present

## 2022-04-24 DIAGNOSIS — I2510 Atherosclerotic heart disease of native coronary artery without angina pectoris: Secondary | ICD-10-CM | POA: Diagnosis not present

## 2022-04-24 DIAGNOSIS — K219 Gastro-esophageal reflux disease without esophagitis: Secondary | ICD-10-CM | POA: Diagnosis not present

## 2022-04-24 DIAGNOSIS — D696 Thrombocytopenia, unspecified: Secondary | ICD-10-CM | POA: Diagnosis not present

## 2022-04-24 DIAGNOSIS — Z7984 Long term (current) use of oral hypoglycemic drugs: Secondary | ICD-10-CM | POA: Diagnosis not present

## 2022-04-24 DIAGNOSIS — E1151 Type 2 diabetes mellitus with diabetic peripheral angiopathy without gangrene: Secondary | ICD-10-CM | POA: Diagnosis not present

## 2022-04-24 DIAGNOSIS — E1122 Type 2 diabetes mellitus with diabetic chronic kidney disease: Secondary | ICD-10-CM | POA: Diagnosis not present

## 2022-04-24 DIAGNOSIS — I509 Heart failure, unspecified: Secondary | ICD-10-CM | POA: Diagnosis not present

## 2022-04-26 DIAGNOSIS — Z7984 Long term (current) use of oral hypoglycemic drugs: Secondary | ICD-10-CM | POA: Diagnosis not present

## 2022-04-26 DIAGNOSIS — M86171 Other acute osteomyelitis, right ankle and foot: Secondary | ICD-10-CM | POA: Diagnosis not present

## 2022-04-26 DIAGNOSIS — K219 Gastro-esophageal reflux disease without esophagitis: Secondary | ICD-10-CM | POA: Diagnosis not present

## 2022-04-26 DIAGNOSIS — Z7982 Long term (current) use of aspirin: Secondary | ICD-10-CM | POA: Diagnosis not present

## 2022-04-26 DIAGNOSIS — D696 Thrombocytopenia, unspecified: Secondary | ICD-10-CM | POA: Diagnosis not present

## 2022-04-26 DIAGNOSIS — E1122 Type 2 diabetes mellitus with diabetic chronic kidney disease: Secondary | ICD-10-CM | POA: Diagnosis not present

## 2022-04-26 DIAGNOSIS — K7460 Unspecified cirrhosis of liver: Secondary | ICD-10-CM | POA: Diagnosis not present

## 2022-04-26 DIAGNOSIS — I509 Heart failure, unspecified: Secondary | ICD-10-CM | POA: Diagnosis not present

## 2022-04-26 DIAGNOSIS — L89610 Pressure ulcer of right heel, unstageable: Secondary | ICD-10-CM | POA: Diagnosis not present

## 2022-04-26 DIAGNOSIS — I130 Hypertensive heart and chronic kidney disease with heart failure and stage 1 through stage 4 chronic kidney disease, or unspecified chronic kidney disease: Secondary | ICD-10-CM | POA: Diagnosis not present

## 2022-04-26 DIAGNOSIS — N1832 Chronic kidney disease, stage 3b: Secondary | ICD-10-CM | POA: Diagnosis not present

## 2022-04-26 DIAGNOSIS — I2510 Atherosclerotic heart disease of native coronary artery without angina pectoris: Secondary | ICD-10-CM | POA: Diagnosis not present

## 2022-04-26 DIAGNOSIS — E039 Hypothyroidism, unspecified: Secondary | ICD-10-CM | POA: Diagnosis not present

## 2022-04-26 DIAGNOSIS — Z7902 Long term (current) use of antithrombotics/antiplatelets: Secondary | ICD-10-CM | POA: Diagnosis not present

## 2022-04-26 DIAGNOSIS — E1151 Type 2 diabetes mellitus with diabetic peripheral angiopathy without gangrene: Secondary | ICD-10-CM | POA: Diagnosis not present

## 2022-04-26 DIAGNOSIS — E1140 Type 2 diabetes mellitus with diabetic neuropathy, unspecified: Secondary | ICD-10-CM | POA: Diagnosis not present

## 2022-04-28 DIAGNOSIS — I130 Hypertensive heart and chronic kidney disease with heart failure and stage 1 through stage 4 chronic kidney disease, or unspecified chronic kidney disease: Secondary | ICD-10-CM | POA: Diagnosis not present

## 2022-04-28 DIAGNOSIS — K7460 Unspecified cirrhosis of liver: Secondary | ICD-10-CM | POA: Diagnosis not present

## 2022-04-28 DIAGNOSIS — E039 Hypothyroidism, unspecified: Secondary | ICD-10-CM | POA: Diagnosis not present

## 2022-04-28 DIAGNOSIS — I509 Heart failure, unspecified: Secondary | ICD-10-CM | POA: Diagnosis not present

## 2022-04-28 DIAGNOSIS — I2510 Atherosclerotic heart disease of native coronary artery without angina pectoris: Secondary | ICD-10-CM | POA: Diagnosis not present

## 2022-04-28 DIAGNOSIS — E1151 Type 2 diabetes mellitus with diabetic peripheral angiopathy without gangrene: Secondary | ICD-10-CM | POA: Diagnosis not present

## 2022-04-28 DIAGNOSIS — E1140 Type 2 diabetes mellitus with diabetic neuropathy, unspecified: Secondary | ICD-10-CM | POA: Diagnosis not present

## 2022-04-28 DIAGNOSIS — D696 Thrombocytopenia, unspecified: Secondary | ICD-10-CM | POA: Diagnosis not present

## 2022-04-28 DIAGNOSIS — N1832 Chronic kidney disease, stage 3b: Secondary | ICD-10-CM | POA: Diagnosis not present

## 2022-04-28 DIAGNOSIS — Z7902 Long term (current) use of antithrombotics/antiplatelets: Secondary | ICD-10-CM | POA: Diagnosis not present

## 2022-04-28 DIAGNOSIS — M86171 Other acute osteomyelitis, right ankle and foot: Secondary | ICD-10-CM | POA: Diagnosis not present

## 2022-04-28 DIAGNOSIS — Z7982 Long term (current) use of aspirin: Secondary | ICD-10-CM | POA: Diagnosis not present

## 2022-04-28 DIAGNOSIS — K219 Gastro-esophageal reflux disease without esophagitis: Secondary | ICD-10-CM | POA: Diagnosis not present

## 2022-04-28 DIAGNOSIS — E1122 Type 2 diabetes mellitus with diabetic chronic kidney disease: Secondary | ICD-10-CM | POA: Diagnosis not present

## 2022-04-28 DIAGNOSIS — Z7984 Long term (current) use of oral hypoglycemic drugs: Secondary | ICD-10-CM | POA: Diagnosis not present

## 2022-04-28 DIAGNOSIS — L89610 Pressure ulcer of right heel, unstageable: Secondary | ICD-10-CM | POA: Diagnosis not present

## 2022-04-29 NOTE — Progress Notes (Unsigned)
Cardiology Office Note  Date: 05/01/2022   ID: Edward Patricia., DOB 01/12/1966, MRN [id number]  PCP:  [place]  Cardiologist:  Rozann Lesches, MD Electrophysiologist:  None   Chief Complaint  Patient presents with   Cardiac follow-up    History of Present Illness: Edward Ciotti. is a 56 y.o. male last seen in March.  He presents for a follow-up visit.  I reviewed the chart, he has been following at [facility] for management of right heel surgical wound.  Has also had home nursing for this.  With current level of activity he describes NYHA class II dyspnea.  Unfortunately, it sounds like he ran out of nearly all of his cardiac medications at least a week ago and had trouble getting them filled at his pharmacy.  His most recent medication list had some duplications and I had nursing review his present list with pharmacy so that we could make adjustments going forward.  As it turns out, it does not sound like he has been on a consistent cardiac regimen at all.  Was not on Entresto, no beta-blocker, not on Corlanor or BiDil or Farxiga or Lipitor.  His weight is down about 15 pounds from earlier in the year.  He has had only trace ankle edema.  Last echocardiogram in February revealed LVEF 20 to 25% range with moderately reduced RV contraction and moderate pulmonary hypertension.  I reviewed his lab work from May as noted below.  Past Medical History:  Diagnosis Date   Acute on chronic renal failure (HCC)    Essential hypertension    GERD (gastroesophageal reflux disease)    Hyperosmolar syndrome 2020   Hypothyroidism    Mild coronary artery disease    Cardiac catheterization September 2022 - [facility]   Nonischemic cardiomyopathy [facility])    PAD (peripheral artery disease) (HCC)    Thrombocytopenia ([place])    Type 2 diabetes mellitus ([place])     Past Surgical History:  Procedure Laterality Date   ABDOMINAL AORTOGRAM W/LOWER EXTREMITY N/A 02/06/2019   Procedure: ABDOMINAL  AORTOGRAM W/LOWER EXTREMITY;  Surgeon: [name], MD;  Location: [place] CV LAB;  Service: Cardiovascular;  Laterality: N/A;  bilateral   ABDOMINAL AORTOGRAM W/LOWER EXTREMITY N/A 04/30/2019   Procedure: ABDOMINAL AORTOGRAM W/LOWER EXTREMITY;  Surgeon: [name], MD;  Location: [place] CV LAB;  Service: Cardiovascular;  Laterality: N/A;   AORTOGRAM  08/11/2019   Procedure: Aortogram;  Surgeon: [name], MD;  Location: [place];  Service: Vascular;;   ESOPHAGEAL BANDING  08/22/2019   Procedure: ESOPHAGEAL BANDING;  Surgeon: Milus Banister, MD;  Location: [facility] ENDOSCOPY;  Service: Endoscopy;;   ESOPHAGOGASTRODUODENOSCOPY N/A 08/22/2019   Procedure: ESOPHAGOGASTRODUODENOSCOPY (EGD);  Surgeon: Milus Banister, MD;  Location: [facility] ENDOSCOPY;  Service: Endoscopy;  Laterality: N/A;   FEMORAL-POPLITEAL BYPASS GRAFT Left 08/11/2019   Procedure: BYPASS LEFT FEMORAL-DISTAL POPLITEAL ARTERY USING PROPATEN GRAFT;  Surgeon: [name], MD;  Location: [place];  Service: Vascular;  Laterality: Left;   INSERTION OF DIALYSIS CATHETER Right 09/09/2019   Procedure: INSERTION OF DIALYSIS CATHETER;  Surgeon: Waynetta Sandy, MD;  Location: [place];  Service: Vascular;  Laterality: Right;   MULTIPLE TOOTH EXTRACTIONS     PERIPHERAL VASCULAR INTERVENTION  02/06/2019   Procedure: PERIPHERAL VASCULAR INTERVENTION;  Surgeon: [name], MD;  Location: [place] CV LAB;  Service: Cardiovascular;;  Bilateral Iliacs   PERIPHERAL VASCULAR INTERVENTION  04/30/2019   Procedure: PERIPHERAL  VASCULAR INTERVENTION;  Surgeon: [name], MD;  Location: [place] CV LAB;  Service: Cardiovascular;;  Stent - Lt. Iliac    REMOVAL OF A DIALYSIS CATHETER Right 09/09/2019   Procedure: Removal Of A Dialysis Catheter;  Surgeon: Waynetta Sandy, MD;  Location: [place];  Service: Vascular;  Laterality: Right;   RIGHT HEART CATH N/A 12/12/2021   Procedure: RIGHT  HEART CATH;  Surgeon: Larey Dresser, MD;  Location: [place] CV LAB;  Service: Cardiovascular;  Laterality: N/A;   ULTRASOUND GUIDANCE FOR VASCULAR ACCESS Right 09/09/2019   Procedure: Ultrasound Guidance For Vascular Access;  Surgeon: Waynetta Sandy, MD;  Location: [place];  Service: Vascular;  Laterality: Right;   VASCULAR SURGERY      Current Outpatient Medications  Medication Sig Dispense Refill   acetaminophen (TYLENOL) 500 MG tablet Take 1 tablet (500 mg total) by mouth daily as needed for moderate pain or headache. 30 tablet 0   amoxicillin (AMOXIL) 500 MG capsule Take 2 capsules by mouth every 8 (eight) hours.     aspirin EC 81 MG tablet Take 81 mg by mouth daily. Swallow whole.     ciprofloxacin (CIPRO) 750 MG tablet Take 750 mg by mouth 2 (two) times daily.     Elastic Bandages & Supports (KNEE SUPPORT/ELASTIC/FIRM MED) MISC 1 each by Does not apply route as directed. Medium pressure compression stockings Dx: leg edema 1 each 0   furosemide (LASIX) 80 MG tablet Take 1 tablet (80 mg total) by mouth daily. 30 tablet 1   gabapentin (NEURONTIN) 300 MG capsule Take 600 mg by mouth 3 (three) times daily.     lidocaine (XYLOCAINE) 5 % ointment Apply 1 application. topically as needed.     nitroGLYCERIN (NITROSTAT) 0.4 MG SL tablet Place 0.4 mg under the tongue every 5 (five) minutes as needed for chest pain.     pregabalin (LYRICA) 25 MG capsule Take 25 mg by mouth 3 (three) times daily.     atorvastatin (LIPITOR) 40 MG tablet Take 40 mg by mouth at bedtime. (Patient not taking: Reported on 05/01/2022)     dapagliflozin propanediol (FARXIGA) 10 MG TABS tablet Take 1 tablet (10 mg total) by mouth daily. (Patient not taking: Reported on 05/01/2022) 30 tablet 6   digoxin (LANOXIN) 0.125 MG tablet Take 1 tablet (0.125 mg total) by mouth daily. (Patient not taking: Reported on 05/01/2022) 30 tablet 6   isosorbide-hydrALAZINE (BIDIL) 20-37.5 MG tablet Take 1 tablet by mouth 3 (three)  times daily. (Patient not taking: Reported on 05/01/2022) 90 tablet 6   ivabradine (CORLANOR) 5 MG TABS tablet Take 1 tablet (5 mg total) by mouth 2 (two) times daily with a meal. (Patient not taking: Reported on 05/01/2022) 60 tablet 6   sacubitril-valsartan (ENTRESTO) 49-51 MG Take 1 tablet by mouth 2 (two) times daily. (Patient not taking: Reported on 05/01/2022) 60 tablet 6   spironolactone (ALDACTONE) 25 MG tablet TAKE 1/2 TABLET(12.5 MG) BY MOUTH DAILY (Patient not taking: Reported on 05/01/2022) 45 tablet 1   torsemide (DEMADEX) 20 MG tablet Take 2 tablets (40 mg total) by mouth 2 (two) times daily. (Patient not taking: Reported on 05/01/2022) 240 tablet 6   No current facility-administered medications for this visit.   Allergies:  Other   ROS: No palpitations or unexplained syncope.  Physical Exam: VS:  BP 120/78   Pulse (!) 105   Ht 6\' 1"  (1.854 m)   Wt 174 lb 6.4 oz (79.1 kg)  SpO2 96%   BMI 23.01 kg/m , BMI Body mass index is 23.01 kg/m.  Wt Readings from Last 3 Encounters:  05/01/22 174 lb 6.4 oz (79.1 kg)  01/13/22 183 lb 3.2 oz (83.1 kg)  12/19/21 191 lb 6.4 oz (86.8 kg)    General: Patient appears comfortable at rest. HEENT: Conjunctiva and lids normal.. Neck: Supple, no elevated JVP or carotid bruits, no thyromegaly. Lungs: Clear to auscultation, nonlabored breathing at rest. Cardiac: Regular rate and rhythm, no S3, 4-9/6 systolic murmur, no pericardial rub. Abdomen: Soft, nontender, bowel sounds present. Extremities: Trace ankle edema.  ECG:  An ECG dated 12/13/2021 was personally reviewed today and demonstrated:  Ectopic atrial tachycardia with LVH and repolarization abnormalities.  Recent Labwork: 12/07/2021: B Natriuretic Peptide 1,324.0 12/12/2021: TSH 6.178 12/13/2021: ALT 13; AST 31 12/16/2021: Magnesium 2.1 12/19/2021: BUN 36; Creatinine, Ser 1.78; Hemoglobin 11.6; Platelets 115; Potassium 3.9; Sodium 134  May 2023: Hemoglobin 11.5, platelets 136, potassium 4.5, BUN  22, creatinine 1.4, AST 54, ALT 32  Other Studies Reviewed Today:  Echocardiogram 12/12/2021:  1. Left ventricular ejection fraction, by estimation, is 20 to 25%. The  left ventricle has severely decreased function. The left ventricle  demonstrates global hypokinesis. There is moderate left ventricular  hypertrophy. Left ventricular diastolic  parameters are consistent with Grade II diastolic dysfunction  (pseudonormalization). Elevated left atrial pressure.   2. Right ventricular systolic function is mild to moderately reduced. The  right ventricular size is moderately enlarged. There is moderately  elevated pulmonary artery systolic pressure. The estimated right  ventricular systolic pressure is 75.9 mmHg.   3. Left atrial size was severely dilated.   4. Right atrial size was severely dilated.   5. A small pericardial effusion is present.   6. The mitral valve is normal in structure. Trivial mitral valve  regurgitation.   7. Tricuspid valve regurgitation is moderate.   8. The aortic valve is tricuspid. Aortic valve regurgitation is not  visualized. Aortic valve sclerosis/calcification is present, without any  evidence of aortic stenosis.   9. The inferior vena cava is dilated in size with <50% respiratory  variability, suggesting right atrial pressure of 15 mmHg.    Right heart catheterization 12/12/2021: RA mean 22 RV 79/28 PA 67/32, mean 42 PCWP mean 25  Oxygen saturations: PA 68% AO 100%  Cardiac Output (Fick) 5.55  Cardiac Index (Fick) 2.43 PVR 3 WU  Cardiac Output (Thermo) 3.64 Cardiac Index (Thermo) 1.59  PVR 4.7 WU   1. Severely elevated right and left heart filling pressures.  2. Low cardiac output by thermodilution, normal by Fick.  3. Primarily pulmonary venous hypertension.    Cardiac MRI 12/16/2021: IMPRESSION: 1.  Moderately dilated LV with EF 23%, diffuse hypokinesis.   2.  Mildly dilated RV with EF 31%.   3. Diffuse delayed enhancement imaging.  Suspect only nonspecific inferior RV insertion site LGE.   4. Mildly elevated extracellular volume percentage in the septum, nonspecific.  Assessment and Plan:  1.  HFrEF with history of nonischemic cardiomyopathy, LVEF 20 to 25% by work-up back in February of this year.  Unfortunately, he has not been on consistent GDMT, most recently just taking Lasix and Aldactone.  Plan is to have him resume Farxiga 10 mg daily, change Lasix to 40 mg daily, continue Aldactone 12.5 mg daily, and start Toprol-XL 12.5 mg daily.  We will bring him back in for a follow-up visit to continue dose adjustments.  BMET in 2 weeks.  Hold off on  a repeat echocardiogram until he gets back on a consistent medical regimen.  Depending on blood pressure, may be able to subsequently add ARNI or ARB.  2.  CKD stage IIIb, last creatinine 1.4 with normal potassium in May.  3.  PAD status post left femoral to popliteal bypass and iliac stenting.  Also with right heel surgical wound being followed at [facility].  Encouraged consistence with aspirin 81 mg daily and also resuming Lipitor 40 mg daily.  Medication Adjustments/Labs and Tests Ordered: Current medicines are reviewed at length with the patient today.  Concerns regarding medicines are outlined above.   Tests Ordered: Orders Placed This Encounter  Procedures   Basic metabolic panel    Medication Changes: No orders of the defined types were placed in this encounter.   Disposition:  Follow up  1 month.  Signed, Satira Sark, MD, [facility] 05/01/2022 9:58 AM    [place] [facility] [place], [place], [place] [address] Phone: [phone]; Fax: [phone]

## 2022-05-01 ENCOUNTER — Encounter: Payer: Self-pay | Admitting: Cardiology

## 2022-05-01 ENCOUNTER — Ambulatory Visit (INDEPENDENT_AMBULATORY_CARE_PROVIDER_SITE_OTHER): Payer: Medicare Other | Admitting: Cardiology

## 2022-05-01 VITALS — BP 120/78 | HR 105 | Ht 73.0 in | Wt 174.4 lb

## 2022-05-01 DIAGNOSIS — I739 Peripheral vascular disease, unspecified: Secondary | ICD-10-CM | POA: Diagnosis not present

## 2022-05-01 DIAGNOSIS — E1151 Type 2 diabetes mellitus with diabetic peripheral angiopathy without gangrene: Secondary | ICD-10-CM | POA: Diagnosis not present

## 2022-05-01 DIAGNOSIS — I509 Heart failure, unspecified: Secondary | ICD-10-CM | POA: Diagnosis not present

## 2022-05-01 DIAGNOSIS — N1832 Chronic kidney disease, stage 3b: Secondary | ICD-10-CM | POA: Diagnosis not present

## 2022-05-01 DIAGNOSIS — D696 Thrombocytopenia, unspecified: Secondary | ICD-10-CM | POA: Diagnosis not present

## 2022-05-01 DIAGNOSIS — I5020 Unspecified systolic (congestive) heart failure: Secondary | ICD-10-CM | POA: Diagnosis not present

## 2022-05-01 DIAGNOSIS — K219 Gastro-esophageal reflux disease without esophagitis: Secondary | ICD-10-CM | POA: Diagnosis not present

## 2022-05-01 DIAGNOSIS — E039 Hypothyroidism, unspecified: Secondary | ICD-10-CM | POA: Diagnosis not present

## 2022-05-01 DIAGNOSIS — M86171 Other acute osteomyelitis, right ankle and foot: Secondary | ICD-10-CM | POA: Diagnosis not present

## 2022-05-01 DIAGNOSIS — L89610 Pressure ulcer of right heel, unstageable: Secondary | ICD-10-CM | POA: Diagnosis not present

## 2022-05-01 DIAGNOSIS — Z7902 Long term (current) use of antithrombotics/antiplatelets: Secondary | ICD-10-CM | POA: Diagnosis not present

## 2022-05-01 DIAGNOSIS — E1122 Type 2 diabetes mellitus with diabetic chronic kidney disease: Secondary | ICD-10-CM | POA: Diagnosis not present

## 2022-05-01 DIAGNOSIS — E1140 Type 2 diabetes mellitus with diabetic neuropathy, unspecified: Secondary | ICD-10-CM | POA: Diagnosis not present

## 2022-05-01 DIAGNOSIS — Z7982 Long term (current) use of aspirin: Secondary | ICD-10-CM | POA: Diagnosis not present

## 2022-05-01 DIAGNOSIS — I130 Hypertensive heart and chronic kidney disease with heart failure and stage 1 through stage 4 chronic kidney disease, or unspecified chronic kidney disease: Secondary | ICD-10-CM | POA: Diagnosis not present

## 2022-05-01 DIAGNOSIS — Z7984 Long term (current) use of oral hypoglycemic drugs: Secondary | ICD-10-CM | POA: Diagnosis not present

## 2022-05-01 DIAGNOSIS — I2510 Atherosclerotic heart disease of native coronary artery without angina pectoris: Secondary | ICD-10-CM | POA: Diagnosis not present

## 2022-05-01 DIAGNOSIS — K7460 Unspecified cirrhosis of liver: Secondary | ICD-10-CM | POA: Diagnosis not present

## 2022-05-01 MED ORDER — ATORVASTATIN CALCIUM 40 MG PO TABS: 40.0000 mg | ORAL_TABLET | Freq: Every day | ORAL | 1 refills | Status: DC

## 2022-05-01 MED ORDER — METOPROLOL SUCCINATE ER 25 MG PO TB24: 12.5000 mg | ORAL_TABLET | Freq: Every day | ORAL | 1 refills | Status: DC

## 2022-05-01 MED ORDER — DAPAGLIFLOZIN PROPANEDIOL 10 MG PO TABS: 10.0000 mg | ORAL_TABLET | Freq: Every day | ORAL | 1 refills | Status: DC

## 2022-05-01 MED ORDER — SPIRONOLACTONE 25 MG PO TABS: 12.5000 mg | ORAL_TABLET | Freq: Every day | ORAL | 1 refills | Status: DC

## 2022-05-01 MED ORDER — FUROSEMIDE 40 MG PO TABS: 40.0000 mg | ORAL_TABLET | Freq: Every day | ORAL | 1 refills | Status: DC

## 2022-05-01 NOTE — Patient Instructions (Addendum)
Medication Instructions:  Stop Digoxin (Lanoxin) Stop Bidil (Isosorbide Dinitrate-Hydralazine) Stop Corlanor (Ivabradine) Stop Entresto (Sacubitril-Valsartan) Stop Torsemide (Demadex) Please take the following medications listed below: [place] 10mg  daily Spironolactone 12.5mg  daily   Lipitor 40mg  daily Aspirin 81mg  daily Furosemide 40mg  daily  Toprol XL 12.5mg  daily (new) Continue all other medications.     Labwork: BMET - order provided  Please do in 2 weeks (around July th) You may do this lab at [facility] in [place] or [facility] in [place] behind [facility] on [address] will contact with results via phone or letter.     Testing/Procedures: Will hold off on Echo for now until get back on medication regiment   Follow-Up: 1 month   Any Other Special Instructions Will Be Listed Below (If Applicable).   If you need a refill on your cardiac medications before your next appointment, please call your pharmacy.

## 2022-05-02 DIAGNOSIS — S91301A Unspecified open wound, right foot, initial encounter: Secondary | ICD-10-CM | POA: Diagnosis not present

## 2022-05-03 DIAGNOSIS — S91301A Unspecified open wound, right foot, initial encounter: Secondary | ICD-10-CM | POA: Diagnosis not present

## 2022-05-03 DIAGNOSIS — I739 Peripheral vascular disease, unspecified: Secondary | ICD-10-CM | POA: Diagnosis not present

## 2022-05-03 DIAGNOSIS — S91301D Unspecified open wound, right foot, subsequent encounter: Secondary | ICD-10-CM | POA: Diagnosis not present

## 2022-05-05 DIAGNOSIS — E1151 Type 2 diabetes mellitus with diabetic peripheral angiopathy without gangrene: Secondary | ICD-10-CM | POA: Diagnosis not present

## 2022-05-05 DIAGNOSIS — N1832 Chronic kidney disease, stage 3b: Secondary | ICD-10-CM | POA: Diagnosis not present

## 2022-05-05 DIAGNOSIS — I2510 Atherosclerotic heart disease of native coronary artery without angina pectoris: Secondary | ICD-10-CM | POA: Diagnosis not present

## 2022-05-05 DIAGNOSIS — Z7902 Long term (current) use of antithrombotics/antiplatelets: Secondary | ICD-10-CM | POA: Diagnosis not present

## 2022-05-05 DIAGNOSIS — Z7984 Long term (current) use of oral hypoglycemic drugs: Secondary | ICD-10-CM | POA: Diagnosis not present

## 2022-05-05 DIAGNOSIS — E039 Hypothyroidism, unspecified: Secondary | ICD-10-CM | POA: Diagnosis not present

## 2022-05-05 DIAGNOSIS — K7460 Unspecified cirrhosis of liver: Secondary | ICD-10-CM | POA: Diagnosis not present

## 2022-05-05 DIAGNOSIS — E1122 Type 2 diabetes mellitus with diabetic chronic kidney disease: Secondary | ICD-10-CM | POA: Diagnosis not present

## 2022-05-05 DIAGNOSIS — I509 Heart failure, unspecified: Secondary | ICD-10-CM | POA: Diagnosis not present

## 2022-05-05 DIAGNOSIS — L89610 Pressure ulcer of right heel, unstageable: Secondary | ICD-10-CM | POA: Diagnosis not present

## 2022-05-05 DIAGNOSIS — D696 Thrombocytopenia, unspecified: Secondary | ICD-10-CM | POA: Diagnosis not present

## 2022-05-05 DIAGNOSIS — E1140 Type 2 diabetes mellitus with diabetic neuropathy, unspecified: Secondary | ICD-10-CM | POA: Diagnosis not present

## 2022-05-05 DIAGNOSIS — Z7982 Long term (current) use of aspirin: Secondary | ICD-10-CM | POA: Diagnosis not present

## 2022-05-05 DIAGNOSIS — K219 Gastro-esophageal reflux disease without esophagitis: Secondary | ICD-10-CM | POA: Diagnosis not present

## 2022-05-05 DIAGNOSIS — M86171 Other acute osteomyelitis, right ankle and foot: Secondary | ICD-10-CM | POA: Diagnosis not present

## 2022-05-05 DIAGNOSIS — I130 Hypertensive heart and chronic kidney disease with heart failure and stage 1 through stage 4 chronic kidney disease, or unspecified chronic kidney disease: Secondary | ICD-10-CM | POA: Diagnosis not present

## 2022-05-06 DIAGNOSIS — R000 Tachycardia, unspecified: Secondary | ICD-10-CM | POA: Diagnosis not present

## 2022-05-06 DIAGNOSIS — S91301A Unspecified open wound, right foot, initial encounter: Secondary | ICD-10-CM | POA: Diagnosis not present

## 2022-05-08 ENCOUNTER — Other Ambulatory Visit: Payer: Self-pay | Admitting: *Deleted

## 2022-05-08 DIAGNOSIS — E1140 Type 2 diabetes mellitus with diabetic neuropathy, unspecified: Secondary | ICD-10-CM | POA: Diagnosis not present

## 2022-05-08 DIAGNOSIS — Z7982 Long term (current) use of aspirin: Secondary | ICD-10-CM | POA: Diagnosis not present

## 2022-05-08 DIAGNOSIS — E039 Hypothyroidism, unspecified: Secondary | ICD-10-CM | POA: Diagnosis not present

## 2022-05-08 DIAGNOSIS — I509 Heart failure, unspecified: Secondary | ICD-10-CM | POA: Diagnosis not present

## 2022-05-08 DIAGNOSIS — K219 Gastro-esophageal reflux disease without esophagitis: Secondary | ICD-10-CM | POA: Diagnosis not present

## 2022-05-08 DIAGNOSIS — M86171 Other acute osteomyelitis, right ankle and foot: Secondary | ICD-10-CM | POA: Diagnosis not present

## 2022-05-08 DIAGNOSIS — K7460 Unspecified cirrhosis of liver: Secondary | ICD-10-CM | POA: Diagnosis not present

## 2022-05-08 DIAGNOSIS — I2510 Atherosclerotic heart disease of native coronary artery without angina pectoris: Secondary | ICD-10-CM | POA: Diagnosis not present

## 2022-05-08 DIAGNOSIS — E1122 Type 2 diabetes mellitus with diabetic chronic kidney disease: Secondary | ICD-10-CM | POA: Diagnosis not present

## 2022-05-08 DIAGNOSIS — E1151 Type 2 diabetes mellitus with diabetic peripheral angiopathy without gangrene: Secondary | ICD-10-CM | POA: Diagnosis not present

## 2022-05-08 DIAGNOSIS — I130 Hypertensive heart and chronic kidney disease with heart failure and stage 1 through stage 4 chronic kidney disease, or unspecified chronic kidney disease: Secondary | ICD-10-CM | POA: Diagnosis not present

## 2022-05-08 DIAGNOSIS — Z7902 Long term (current) use of antithrombotics/antiplatelets: Secondary | ICD-10-CM | POA: Diagnosis not present

## 2022-05-08 DIAGNOSIS — L89610 Pressure ulcer of right heel, unstageable: Secondary | ICD-10-CM | POA: Diagnosis not present

## 2022-05-08 DIAGNOSIS — N1832 Chronic kidney disease, stage 3b: Secondary | ICD-10-CM | POA: Diagnosis not present

## 2022-05-08 DIAGNOSIS — D696 Thrombocytopenia, unspecified: Secondary | ICD-10-CM | POA: Diagnosis not present

## 2022-05-08 DIAGNOSIS — Z7984 Long term (current) use of oral hypoglycemic drugs: Secondary | ICD-10-CM | POA: Diagnosis not present

## 2022-05-10 DIAGNOSIS — I130 Hypertensive heart and chronic kidney disease with heart failure and stage 1 through stage 4 chronic kidney disease, or unspecified chronic kidney disease: Secondary | ICD-10-CM | POA: Diagnosis not present

## 2022-05-10 DIAGNOSIS — L89610 Pressure ulcer of right heel, unstageable: Secondary | ICD-10-CM | POA: Diagnosis not present

## 2022-05-10 DIAGNOSIS — Z7982 Long term (current) use of aspirin: Secondary | ICD-10-CM | POA: Diagnosis not present

## 2022-05-10 DIAGNOSIS — E1151 Type 2 diabetes mellitus with diabetic peripheral angiopathy without gangrene: Secondary | ICD-10-CM | POA: Diagnosis not present

## 2022-05-10 DIAGNOSIS — I2510 Atherosclerotic heart disease of native coronary artery without angina pectoris: Secondary | ICD-10-CM | POA: Diagnosis not present

## 2022-05-10 DIAGNOSIS — M86171 Other acute osteomyelitis, right ankle and foot: Secondary | ICD-10-CM | POA: Diagnosis not present

## 2022-05-10 DIAGNOSIS — Z7984 Long term (current) use of oral hypoglycemic drugs: Secondary | ICD-10-CM | POA: Diagnosis not present

## 2022-05-10 DIAGNOSIS — E1140 Type 2 diabetes mellitus with diabetic neuropathy, unspecified: Secondary | ICD-10-CM | POA: Diagnosis not present

## 2022-05-10 DIAGNOSIS — I509 Heart failure, unspecified: Secondary | ICD-10-CM | POA: Diagnosis not present

## 2022-05-10 DIAGNOSIS — Z7902 Long term (current) use of antithrombotics/antiplatelets: Secondary | ICD-10-CM | POA: Diagnosis not present

## 2022-05-10 DIAGNOSIS — N1832 Chronic kidney disease, stage 3b: Secondary | ICD-10-CM | POA: Diagnosis not present

## 2022-05-10 DIAGNOSIS — D696 Thrombocytopenia, unspecified: Secondary | ICD-10-CM | POA: Diagnosis not present

## 2022-05-10 DIAGNOSIS — K7460 Unspecified cirrhosis of liver: Secondary | ICD-10-CM | POA: Diagnosis not present

## 2022-05-10 DIAGNOSIS — K219 Gastro-esophageal reflux disease without esophagitis: Secondary | ICD-10-CM | POA: Diagnosis not present

## 2022-05-10 DIAGNOSIS — E039 Hypothyroidism, unspecified: Secondary | ICD-10-CM | POA: Diagnosis not present

## 2022-05-10 DIAGNOSIS — E1122 Type 2 diabetes mellitus with diabetic chronic kidney disease: Secondary | ICD-10-CM | POA: Diagnosis not present

## 2022-05-12 DIAGNOSIS — I130 Hypertensive heart and chronic kidney disease with heart failure and stage 1 through stage 4 chronic kidney disease, or unspecified chronic kidney disease: Secondary | ICD-10-CM | POA: Diagnosis not present

## 2022-05-12 DIAGNOSIS — E039 Hypothyroidism, unspecified: Secondary | ICD-10-CM | POA: Diagnosis not present

## 2022-05-12 DIAGNOSIS — K7460 Unspecified cirrhosis of liver: Secondary | ICD-10-CM | POA: Diagnosis not present

## 2022-05-12 DIAGNOSIS — E1140 Type 2 diabetes mellitus with diabetic neuropathy, unspecified: Secondary | ICD-10-CM | POA: Diagnosis not present

## 2022-05-12 DIAGNOSIS — E1151 Type 2 diabetes mellitus with diabetic peripheral angiopathy without gangrene: Secondary | ICD-10-CM | POA: Diagnosis not present

## 2022-05-12 DIAGNOSIS — L89610 Pressure ulcer of right heel, unstageable: Secondary | ICD-10-CM | POA: Diagnosis not present

## 2022-05-12 DIAGNOSIS — Z7984 Long term (current) use of oral hypoglycemic drugs: Secondary | ICD-10-CM | POA: Diagnosis not present

## 2022-05-12 DIAGNOSIS — N1832 Chronic kidney disease, stage 3b: Secondary | ICD-10-CM | POA: Diagnosis not present

## 2022-05-12 DIAGNOSIS — I509 Heart failure, unspecified: Secondary | ICD-10-CM | POA: Diagnosis not present

## 2022-05-12 DIAGNOSIS — I2510 Atherosclerotic heart disease of native coronary artery without angina pectoris: Secondary | ICD-10-CM | POA: Diagnosis not present

## 2022-05-12 DIAGNOSIS — E1122 Type 2 diabetes mellitus with diabetic chronic kidney disease: Secondary | ICD-10-CM | POA: Diagnosis not present

## 2022-05-12 DIAGNOSIS — Z7902 Long term (current) use of antithrombotics/antiplatelets: Secondary | ICD-10-CM | POA: Diagnosis not present

## 2022-05-12 DIAGNOSIS — K219 Gastro-esophageal reflux disease without esophagitis: Secondary | ICD-10-CM | POA: Diagnosis not present

## 2022-05-12 DIAGNOSIS — Z7982 Long term (current) use of aspirin: Secondary | ICD-10-CM | POA: Diagnosis not present

## 2022-05-12 DIAGNOSIS — M86171 Other acute osteomyelitis, right ankle and foot: Secondary | ICD-10-CM | POA: Diagnosis not present

## 2022-05-12 DIAGNOSIS — D696 Thrombocytopenia, unspecified: Secondary | ICD-10-CM | POA: Diagnosis not present

## 2022-05-15 DIAGNOSIS — N1832 Chronic kidney disease, stage 3b: Secondary | ICD-10-CM | POA: Diagnosis not present

## 2022-05-15 DIAGNOSIS — I2510 Atherosclerotic heart disease of native coronary artery without angina pectoris: Secondary | ICD-10-CM | POA: Diagnosis not present

## 2022-05-15 DIAGNOSIS — Z7902 Long term (current) use of antithrombotics/antiplatelets: Secondary | ICD-10-CM | POA: Diagnosis not present

## 2022-05-15 DIAGNOSIS — K219 Gastro-esophageal reflux disease without esophagitis: Secondary | ICD-10-CM | POA: Diagnosis not present

## 2022-05-15 DIAGNOSIS — I130 Hypertensive heart and chronic kidney disease with heart failure and stage 1 through stage 4 chronic kidney disease, or unspecified chronic kidney disease: Secondary | ICD-10-CM | POA: Diagnosis not present

## 2022-05-15 DIAGNOSIS — Z7982 Long term (current) use of aspirin: Secondary | ICD-10-CM | POA: Diagnosis not present

## 2022-05-15 DIAGNOSIS — M86171 Other acute osteomyelitis, right ankle and foot: Secondary | ICD-10-CM | POA: Diagnosis not present

## 2022-05-15 DIAGNOSIS — E039 Hypothyroidism, unspecified: Secondary | ICD-10-CM | POA: Diagnosis not present

## 2022-05-15 DIAGNOSIS — Z7984 Long term (current) use of oral hypoglycemic drugs: Secondary | ICD-10-CM | POA: Diagnosis not present

## 2022-05-15 DIAGNOSIS — E1151 Type 2 diabetes mellitus with diabetic peripheral angiopathy without gangrene: Secondary | ICD-10-CM | POA: Diagnosis not present

## 2022-05-15 DIAGNOSIS — I509 Heart failure, unspecified: Secondary | ICD-10-CM | POA: Diagnosis not present

## 2022-05-15 DIAGNOSIS — D696 Thrombocytopenia, unspecified: Secondary | ICD-10-CM | POA: Diagnosis not present

## 2022-05-15 DIAGNOSIS — E1122 Type 2 diabetes mellitus with diabetic chronic kidney disease: Secondary | ICD-10-CM | POA: Diagnosis not present

## 2022-05-15 DIAGNOSIS — K7460 Unspecified cirrhosis of liver: Secondary | ICD-10-CM | POA: Diagnosis not present

## 2022-05-15 DIAGNOSIS — L89610 Pressure ulcer of right heel, unstageable: Secondary | ICD-10-CM | POA: Diagnosis not present

## 2022-05-15 DIAGNOSIS — E1140 Type 2 diabetes mellitus with diabetic neuropathy, unspecified: Secondary | ICD-10-CM | POA: Diagnosis not present

## 2022-05-17 DIAGNOSIS — M86171 Other acute osteomyelitis, right ankle and foot: Secondary | ICD-10-CM | POA: Diagnosis not present

## 2022-05-17 DIAGNOSIS — I2510 Atherosclerotic heart disease of native coronary artery without angina pectoris: Secondary | ICD-10-CM | POA: Diagnosis not present

## 2022-05-17 DIAGNOSIS — E1151 Type 2 diabetes mellitus with diabetic peripheral angiopathy without gangrene: Secondary | ICD-10-CM | POA: Diagnosis not present

## 2022-05-17 DIAGNOSIS — I509 Heart failure, unspecified: Secondary | ICD-10-CM | POA: Diagnosis not present

## 2022-05-17 DIAGNOSIS — K219 Gastro-esophageal reflux disease without esophagitis: Secondary | ICD-10-CM | POA: Diagnosis not present

## 2022-05-17 DIAGNOSIS — E1140 Type 2 diabetes mellitus with diabetic neuropathy, unspecified: Secondary | ICD-10-CM | POA: Diagnosis not present

## 2022-05-17 DIAGNOSIS — Z7902 Long term (current) use of antithrombotics/antiplatelets: Secondary | ICD-10-CM | POA: Diagnosis not present

## 2022-05-17 DIAGNOSIS — D696 Thrombocytopenia, unspecified: Secondary | ICD-10-CM | POA: Diagnosis not present

## 2022-05-17 DIAGNOSIS — E039 Hypothyroidism, unspecified: Secondary | ICD-10-CM | POA: Diagnosis not present

## 2022-05-17 DIAGNOSIS — Z7984 Long term (current) use of oral hypoglycemic drugs: Secondary | ICD-10-CM | POA: Diagnosis not present

## 2022-05-17 DIAGNOSIS — Z7982 Long term (current) use of aspirin: Secondary | ICD-10-CM | POA: Diagnosis not present

## 2022-05-17 DIAGNOSIS — K7460 Unspecified cirrhosis of liver: Secondary | ICD-10-CM | POA: Diagnosis not present

## 2022-05-17 DIAGNOSIS — N1832 Chronic kidney disease, stage 3b: Secondary | ICD-10-CM | POA: Diagnosis not present

## 2022-05-17 DIAGNOSIS — E1122 Type 2 diabetes mellitus with diabetic chronic kidney disease: Secondary | ICD-10-CM | POA: Diagnosis not present

## 2022-05-17 DIAGNOSIS — L89610 Pressure ulcer of right heel, unstageable: Secondary | ICD-10-CM | POA: Diagnosis not present

## 2022-05-17 DIAGNOSIS — I130 Hypertensive heart and chronic kidney disease with heart failure and stage 1 through stage 4 chronic kidney disease, or unspecified chronic kidney disease: Secondary | ICD-10-CM | POA: Diagnosis not present

## 2022-05-19 DIAGNOSIS — E1151 Type 2 diabetes mellitus with diabetic peripheral angiopathy without gangrene: Secondary | ICD-10-CM | POA: Diagnosis not present

## 2022-05-19 DIAGNOSIS — N1832 Chronic kidney disease, stage 3b: Secondary | ICD-10-CM | POA: Diagnosis not present

## 2022-05-19 DIAGNOSIS — L89610 Pressure ulcer of right heel, unstageable: Secondary | ICD-10-CM | POA: Diagnosis not present

## 2022-05-19 DIAGNOSIS — M86171 Other acute osteomyelitis, right ankle and foot: Secondary | ICD-10-CM | POA: Diagnosis not present

## 2022-05-19 DIAGNOSIS — E039 Hypothyroidism, unspecified: Secondary | ICD-10-CM | POA: Diagnosis not present

## 2022-05-19 DIAGNOSIS — E1140 Type 2 diabetes mellitus with diabetic neuropathy, unspecified: Secondary | ICD-10-CM | POA: Diagnosis not present

## 2022-05-19 DIAGNOSIS — Z7902 Long term (current) use of antithrombotics/antiplatelets: Secondary | ICD-10-CM | POA: Diagnosis not present

## 2022-05-19 DIAGNOSIS — K219 Gastro-esophageal reflux disease without esophagitis: Secondary | ICD-10-CM | POA: Diagnosis not present

## 2022-05-19 DIAGNOSIS — Z7982 Long term (current) use of aspirin: Secondary | ICD-10-CM | POA: Diagnosis not present

## 2022-05-19 DIAGNOSIS — I2510 Atherosclerotic heart disease of native coronary artery without angina pectoris: Secondary | ICD-10-CM | POA: Diagnosis not present

## 2022-05-19 DIAGNOSIS — I509 Heart failure, unspecified: Secondary | ICD-10-CM | POA: Diagnosis not present

## 2022-05-19 DIAGNOSIS — D696 Thrombocytopenia, unspecified: Secondary | ICD-10-CM | POA: Diagnosis not present

## 2022-05-19 DIAGNOSIS — K7460 Unspecified cirrhosis of liver: Secondary | ICD-10-CM | POA: Diagnosis not present

## 2022-05-19 DIAGNOSIS — E1122 Type 2 diabetes mellitus with diabetic chronic kidney disease: Secondary | ICD-10-CM | POA: Diagnosis not present

## 2022-05-19 DIAGNOSIS — I130 Hypertensive heart and chronic kidney disease with heart failure and stage 1 through stage 4 chronic kidney disease, or unspecified chronic kidney disease: Secondary | ICD-10-CM | POA: Diagnosis not present

## 2022-05-19 DIAGNOSIS — Z7984 Long term (current) use of oral hypoglycemic drugs: Secondary | ICD-10-CM | POA: Diagnosis not present

## 2022-05-22 DIAGNOSIS — I130 Hypertensive heart and chronic kidney disease with heart failure and stage 1 through stage 4 chronic kidney disease, or unspecified chronic kidney disease: Secondary | ICD-10-CM | POA: Diagnosis not present

## 2022-05-22 DIAGNOSIS — N1832 Chronic kidney disease, stage 3b: Secondary | ICD-10-CM | POA: Diagnosis not present

## 2022-05-22 DIAGNOSIS — E1151 Type 2 diabetes mellitus with diabetic peripheral angiopathy without gangrene: Secondary | ICD-10-CM | POA: Diagnosis not present

## 2022-05-22 DIAGNOSIS — K7460 Unspecified cirrhosis of liver: Secondary | ICD-10-CM | POA: Diagnosis not present

## 2022-05-22 DIAGNOSIS — E039 Hypothyroidism, unspecified: Secondary | ICD-10-CM | POA: Diagnosis not present

## 2022-05-22 DIAGNOSIS — Z7982 Long term (current) use of aspirin: Secondary | ICD-10-CM | POA: Diagnosis not present

## 2022-05-22 DIAGNOSIS — E1122 Type 2 diabetes mellitus with diabetic chronic kidney disease: Secondary | ICD-10-CM | POA: Diagnosis not present

## 2022-05-22 DIAGNOSIS — M86171 Other acute osteomyelitis, right ankle and foot: Secondary | ICD-10-CM | POA: Diagnosis not present

## 2022-05-22 DIAGNOSIS — K219 Gastro-esophageal reflux disease without esophagitis: Secondary | ICD-10-CM | POA: Diagnosis not present

## 2022-05-22 DIAGNOSIS — I2510 Atherosclerotic heart disease of native coronary artery without angina pectoris: Secondary | ICD-10-CM | POA: Diagnosis not present

## 2022-05-22 DIAGNOSIS — E1140 Type 2 diabetes mellitus with diabetic neuropathy, unspecified: Secondary | ICD-10-CM | POA: Diagnosis not present

## 2022-05-22 DIAGNOSIS — I509 Heart failure, unspecified: Secondary | ICD-10-CM | POA: Diagnosis not present

## 2022-05-22 DIAGNOSIS — L89610 Pressure ulcer of right heel, unstageable: Secondary | ICD-10-CM | POA: Diagnosis not present

## 2022-05-22 DIAGNOSIS — D696 Thrombocytopenia, unspecified: Secondary | ICD-10-CM | POA: Diagnosis not present

## 2022-05-22 DIAGNOSIS — Z7984 Long term (current) use of oral hypoglycemic drugs: Secondary | ICD-10-CM | POA: Diagnosis not present

## 2022-05-22 DIAGNOSIS — Z7902 Long term (current) use of antithrombotics/antiplatelets: Secondary | ICD-10-CM | POA: Diagnosis not present

## 2022-05-26 DIAGNOSIS — Z7984 Long term (current) use of oral hypoglycemic drugs: Secondary | ICD-10-CM | POA: Diagnosis not present

## 2022-05-26 DIAGNOSIS — K219 Gastro-esophageal reflux disease without esophagitis: Secondary | ICD-10-CM | POA: Diagnosis not present

## 2022-05-26 DIAGNOSIS — Z7982 Long term (current) use of aspirin: Secondary | ICD-10-CM | POA: Diagnosis not present

## 2022-05-26 DIAGNOSIS — E1140 Type 2 diabetes mellitus with diabetic neuropathy, unspecified: Secondary | ICD-10-CM | POA: Diagnosis not present

## 2022-05-26 DIAGNOSIS — K7460 Unspecified cirrhosis of liver: Secondary | ICD-10-CM | POA: Diagnosis not present

## 2022-05-26 DIAGNOSIS — D696 Thrombocytopenia, unspecified: Secondary | ICD-10-CM | POA: Diagnosis not present

## 2022-05-26 DIAGNOSIS — I2510 Atherosclerotic heart disease of native coronary artery without angina pectoris: Secondary | ICD-10-CM | POA: Diagnosis not present

## 2022-05-26 DIAGNOSIS — N1832 Chronic kidney disease, stage 3b: Secondary | ICD-10-CM | POA: Diagnosis not present

## 2022-05-26 DIAGNOSIS — L89610 Pressure ulcer of right heel, unstageable: Secondary | ICD-10-CM | POA: Diagnosis not present

## 2022-05-26 DIAGNOSIS — I509 Heart failure, unspecified: Secondary | ICD-10-CM | POA: Diagnosis not present

## 2022-05-26 DIAGNOSIS — M86171 Other acute osteomyelitis, right ankle and foot: Secondary | ICD-10-CM | POA: Diagnosis not present

## 2022-05-26 DIAGNOSIS — E1151 Type 2 diabetes mellitus with diabetic peripheral angiopathy without gangrene: Secondary | ICD-10-CM | POA: Diagnosis not present

## 2022-05-26 DIAGNOSIS — Z7902 Long term (current) use of antithrombotics/antiplatelets: Secondary | ICD-10-CM | POA: Diagnosis not present

## 2022-05-26 DIAGNOSIS — E1122 Type 2 diabetes mellitus with diabetic chronic kidney disease: Secondary | ICD-10-CM | POA: Diagnosis not present

## 2022-05-26 DIAGNOSIS — I130 Hypertensive heart and chronic kidney disease with heart failure and stage 1 through stage 4 chronic kidney disease, or unspecified chronic kidney disease: Secondary | ICD-10-CM | POA: Diagnosis not present

## 2022-05-26 DIAGNOSIS — E039 Hypothyroidism, unspecified: Secondary | ICD-10-CM | POA: Diagnosis not present

## 2022-05-29 DIAGNOSIS — L89610 Pressure ulcer of right heel, unstageable: Secondary | ICD-10-CM | POA: Diagnosis not present

## 2022-05-29 DIAGNOSIS — E1122 Type 2 diabetes mellitus with diabetic chronic kidney disease: Secondary | ICD-10-CM | POA: Diagnosis not present

## 2022-05-29 DIAGNOSIS — K7460 Unspecified cirrhosis of liver: Secondary | ICD-10-CM | POA: Diagnosis not present

## 2022-05-29 DIAGNOSIS — I2510 Atherosclerotic heart disease of native coronary artery without angina pectoris: Secondary | ICD-10-CM | POA: Diagnosis not present

## 2022-05-29 DIAGNOSIS — N1832 Chronic kidney disease, stage 3b: Secondary | ICD-10-CM | POA: Diagnosis not present

## 2022-05-29 DIAGNOSIS — Z7902 Long term (current) use of antithrombotics/antiplatelets: Secondary | ICD-10-CM | POA: Diagnosis not present

## 2022-05-29 DIAGNOSIS — I509 Heart failure, unspecified: Secondary | ICD-10-CM | POA: Diagnosis not present

## 2022-05-29 DIAGNOSIS — Z7984 Long term (current) use of oral hypoglycemic drugs: Secondary | ICD-10-CM | POA: Diagnosis not present

## 2022-05-29 DIAGNOSIS — M86171 Other acute osteomyelitis, right ankle and foot: Secondary | ICD-10-CM | POA: Diagnosis not present

## 2022-05-29 DIAGNOSIS — K219 Gastro-esophageal reflux disease without esophagitis: Secondary | ICD-10-CM | POA: Diagnosis not present

## 2022-05-29 DIAGNOSIS — E1140 Type 2 diabetes mellitus with diabetic neuropathy, unspecified: Secondary | ICD-10-CM | POA: Diagnosis not present

## 2022-05-29 DIAGNOSIS — D696 Thrombocytopenia, unspecified: Secondary | ICD-10-CM | POA: Diagnosis not present

## 2022-05-29 DIAGNOSIS — E1151 Type 2 diabetes mellitus with diabetic peripheral angiopathy without gangrene: Secondary | ICD-10-CM | POA: Diagnosis not present

## 2022-05-29 DIAGNOSIS — Z7982 Long term (current) use of aspirin: Secondary | ICD-10-CM | POA: Diagnosis not present

## 2022-05-29 DIAGNOSIS — I130 Hypertensive heart and chronic kidney disease with heart failure and stage 1 through stage 4 chronic kidney disease, or unspecified chronic kidney disease: Secondary | ICD-10-CM | POA: Diagnosis not present

## 2022-05-29 DIAGNOSIS — E039 Hypothyroidism, unspecified: Secondary | ICD-10-CM | POA: Diagnosis not present

## 2022-05-31 DIAGNOSIS — E1140 Type 2 diabetes mellitus with diabetic neuropathy, unspecified: Secondary | ICD-10-CM | POA: Diagnosis not present

## 2022-05-31 DIAGNOSIS — Z7902 Long term (current) use of antithrombotics/antiplatelets: Secondary | ICD-10-CM | POA: Diagnosis not present

## 2022-05-31 DIAGNOSIS — D696 Thrombocytopenia, unspecified: Secondary | ICD-10-CM | POA: Diagnosis not present

## 2022-05-31 DIAGNOSIS — K219 Gastro-esophageal reflux disease without esophagitis: Secondary | ICD-10-CM | POA: Diagnosis not present

## 2022-05-31 DIAGNOSIS — I130 Hypertensive heart and chronic kidney disease with heart failure and stage 1 through stage 4 chronic kidney disease, or unspecified chronic kidney disease: Secondary | ICD-10-CM | POA: Diagnosis not present

## 2022-05-31 DIAGNOSIS — E1122 Type 2 diabetes mellitus with diabetic chronic kidney disease: Secondary | ICD-10-CM | POA: Diagnosis not present

## 2022-05-31 DIAGNOSIS — K7460 Unspecified cirrhosis of liver: Secondary | ICD-10-CM | POA: Diagnosis not present

## 2022-05-31 DIAGNOSIS — M86171 Other acute osteomyelitis, right ankle and foot: Secondary | ICD-10-CM | POA: Diagnosis not present

## 2022-05-31 DIAGNOSIS — L89610 Pressure ulcer of right heel, unstageable: Secondary | ICD-10-CM | POA: Diagnosis not present

## 2022-05-31 DIAGNOSIS — Z7984 Long term (current) use of oral hypoglycemic drugs: Secondary | ICD-10-CM | POA: Diagnosis not present

## 2022-05-31 DIAGNOSIS — Z7982 Long term (current) use of aspirin: Secondary | ICD-10-CM | POA: Diagnosis not present

## 2022-05-31 DIAGNOSIS — E039 Hypothyroidism, unspecified: Secondary | ICD-10-CM | POA: Diagnosis not present

## 2022-05-31 DIAGNOSIS — I2510 Atherosclerotic heart disease of native coronary artery without angina pectoris: Secondary | ICD-10-CM | POA: Diagnosis not present

## 2022-05-31 DIAGNOSIS — I509 Heart failure, unspecified: Secondary | ICD-10-CM | POA: Diagnosis not present

## 2022-05-31 DIAGNOSIS — N1832 Chronic kidney disease, stage 3b: Secondary | ICD-10-CM | POA: Diagnosis not present

## 2022-05-31 DIAGNOSIS — E1151 Type 2 diabetes mellitus with diabetic peripheral angiopathy without gangrene: Secondary | ICD-10-CM | POA: Diagnosis not present

## 2022-06-02 DIAGNOSIS — L89610 Pressure ulcer of right heel, unstageable: Secondary | ICD-10-CM | POA: Diagnosis not present

## 2022-06-02 DIAGNOSIS — E1140 Type 2 diabetes mellitus with diabetic neuropathy, unspecified: Secondary | ICD-10-CM | POA: Diagnosis not present

## 2022-06-02 DIAGNOSIS — I2510 Atherosclerotic heart disease of native coronary artery without angina pectoris: Secondary | ICD-10-CM | POA: Diagnosis not present

## 2022-06-02 DIAGNOSIS — K219 Gastro-esophageal reflux disease without esophagitis: Secondary | ICD-10-CM | POA: Diagnosis not present

## 2022-06-02 DIAGNOSIS — Z7902 Long term (current) use of antithrombotics/antiplatelets: Secondary | ICD-10-CM | POA: Diagnosis not present

## 2022-06-02 DIAGNOSIS — K7460 Unspecified cirrhosis of liver: Secondary | ICD-10-CM | POA: Diagnosis not present

## 2022-06-02 DIAGNOSIS — I130 Hypertensive heart and chronic kidney disease with heart failure and stage 1 through stage 4 chronic kidney disease, or unspecified chronic kidney disease: Secondary | ICD-10-CM | POA: Diagnosis not present

## 2022-06-02 DIAGNOSIS — Z7982 Long term (current) use of aspirin: Secondary | ICD-10-CM | POA: Diagnosis not present

## 2022-06-02 DIAGNOSIS — E039 Hypothyroidism, unspecified: Secondary | ICD-10-CM | POA: Diagnosis not present

## 2022-06-02 DIAGNOSIS — E1151 Type 2 diabetes mellitus with diabetic peripheral angiopathy without gangrene: Secondary | ICD-10-CM | POA: Diagnosis not present

## 2022-06-02 DIAGNOSIS — I509 Heart failure, unspecified: Secondary | ICD-10-CM | POA: Diagnosis not present

## 2022-06-02 DIAGNOSIS — M86171 Other acute osteomyelitis, right ankle and foot: Secondary | ICD-10-CM | POA: Diagnosis not present

## 2022-06-02 DIAGNOSIS — E1122 Type 2 diabetes mellitus with diabetic chronic kidney disease: Secondary | ICD-10-CM | POA: Diagnosis not present

## 2022-06-02 DIAGNOSIS — N1832 Chronic kidney disease, stage 3b: Secondary | ICD-10-CM | POA: Diagnosis not present

## 2022-06-02 DIAGNOSIS — Z7984 Long term (current) use of oral hypoglycemic drugs: Secondary | ICD-10-CM | POA: Diagnosis not present

## 2022-06-02 DIAGNOSIS — D696 Thrombocytopenia, unspecified: Secondary | ICD-10-CM | POA: Diagnosis not present

## 2022-06-05 DIAGNOSIS — N1832 Chronic kidney disease, stage 3b: Secondary | ICD-10-CM | POA: Diagnosis not present

## 2022-06-05 DIAGNOSIS — D696 Thrombocytopenia, unspecified: Secondary | ICD-10-CM | POA: Diagnosis not present

## 2022-06-05 DIAGNOSIS — K219 Gastro-esophageal reflux disease without esophagitis: Secondary | ICD-10-CM | POA: Diagnosis not present

## 2022-06-05 DIAGNOSIS — L89610 Pressure ulcer of right heel, unstageable: Secondary | ICD-10-CM | POA: Diagnosis not present

## 2022-06-05 DIAGNOSIS — Z7902 Long term (current) use of antithrombotics/antiplatelets: Secondary | ICD-10-CM | POA: Diagnosis not present

## 2022-06-05 DIAGNOSIS — E1122 Type 2 diabetes mellitus with diabetic chronic kidney disease: Secondary | ICD-10-CM | POA: Diagnosis not present

## 2022-06-05 DIAGNOSIS — Z7984 Long term (current) use of oral hypoglycemic drugs: Secondary | ICD-10-CM | POA: Diagnosis not present

## 2022-06-05 DIAGNOSIS — E1140 Type 2 diabetes mellitus with diabetic neuropathy, unspecified: Secondary | ICD-10-CM | POA: Diagnosis not present

## 2022-06-05 DIAGNOSIS — I130 Hypertensive heart and chronic kidney disease with heart failure and stage 1 through stage 4 chronic kidney disease, or unspecified chronic kidney disease: Secondary | ICD-10-CM | POA: Diagnosis not present

## 2022-06-05 DIAGNOSIS — M86171 Other acute osteomyelitis, right ankle and foot: Secondary | ICD-10-CM | POA: Diagnosis not present

## 2022-06-05 DIAGNOSIS — E039 Hypothyroidism, unspecified: Secondary | ICD-10-CM | POA: Diagnosis not present

## 2022-06-05 DIAGNOSIS — K7460 Unspecified cirrhosis of liver: Secondary | ICD-10-CM | POA: Diagnosis not present

## 2022-06-05 DIAGNOSIS — I509 Heart failure, unspecified: Secondary | ICD-10-CM | POA: Diagnosis not present

## 2022-06-05 DIAGNOSIS — I2510 Atherosclerotic heart disease of native coronary artery without angina pectoris: Secondary | ICD-10-CM | POA: Diagnosis not present

## 2022-06-05 DIAGNOSIS — Z7982 Long term (current) use of aspirin: Secondary | ICD-10-CM | POA: Diagnosis not present

## 2022-06-05 DIAGNOSIS — E1151 Type 2 diabetes mellitus with diabetic peripheral angiopathy without gangrene: Secondary | ICD-10-CM | POA: Diagnosis not present

## 2022-06-06 DIAGNOSIS — R0989 Other specified symptoms and signs involving the circulatory and respiratory systems: Secondary | ICD-10-CM | POA: Diagnosis not present

## 2022-06-06 DIAGNOSIS — Z7689 Persons encountering health services in other specified circumstances: Secondary | ICD-10-CM | POA: Diagnosis not present

## 2022-06-06 DIAGNOSIS — S91301A Unspecified open wound, right foot, initial encounter: Secondary | ICD-10-CM | POA: Diagnosis not present

## 2022-06-06 DIAGNOSIS — E119 Type 2 diabetes mellitus without complications: Secondary | ICD-10-CM | POA: Diagnosis not present

## 2022-06-06 DIAGNOSIS — Z13220 Encounter for screening for lipoid disorders: Secondary | ICD-10-CM | POA: Diagnosis not present

## 2022-06-06 DIAGNOSIS — Z1211 Encounter for screening for malignant neoplasm of colon: Secondary | ICD-10-CM | POA: Diagnosis not present

## 2022-06-06 DIAGNOSIS — E559 Vitamin D deficiency, unspecified: Secondary | ICD-10-CM | POA: Diagnosis not present

## 2022-06-06 DIAGNOSIS — R000 Tachycardia, unspecified: Secondary | ICD-10-CM | POA: Diagnosis not present

## 2022-06-06 DIAGNOSIS — I10 Essential (primary) hypertension: Secondary | ICD-10-CM | POA: Diagnosis not present

## 2022-06-06 NOTE — Progress Notes (Deleted)
Cardiology Office Note    Date:  06/06/2022   ID:  Edward Rocha., DOB 05-08-1966, MRN [id number]  PCP:  [place]  Cardiologist: Rozann Lesches, MD    No chief complaint on file.   History of Present Illness:    Edward Rocha. is a 56 y.o. male with past medical history of HFrEF (EF 20-25% in 06/2021 with cath showing mild, nonobstructive CAD), PAD (s/p iliac stenting), HTN, HLD, Type 2 DM and Stage 3 CKD who presents to the office today for 1 month follow-up.  He was followed by the Advanced Heart Failure service during an admission in 11/2021 for an acute CHF exacerbation. He was started on Torsemide, Entresto, Farxiga, Spironolactone, BiDil, Digoxin and Corlanor for his cardiomyopathy and beta-blocker therapy was held given his low output. He did follow-up with Dr. Domenic Polite in 01/13/2022 and reported NYHA class II dyspnea but denied any chest pain or palpitations.  Activity was limited secondary to peripheral neuropathy and chronic leg pain. Entresto was titrated to 49-51 mg twice daily and follow-up was arranged with [facility] but he no showed for the visit.  He was again evaluated Dr. Domenic Polite in 04/2022 and had been without several of his medications as he was no longer on Entresto, Corlanor, BiDil, Farxiga or Lipitor. He was only taking Lasix and Spironolactone at the time, therefore he was started on Farxiga 10 mg daily and Toprol-XL 12.5 mg daily with Lasix being increased to 40 mg daily. It was recommended to hold off on a follow-up echocardiogram until he was back on consistent medical therapy. Follow-up labs were recommended in 2 weeks but it does not appear these were obtained.   - BMET - Entresto  Past Medical History:  Diagnosis Date   Acute on chronic renal failure (HCC)    Essential hypertension    GERD (gastroesophageal reflux disease)    Hyperosmolar syndrome 2020   Hypothyroidism    Mild coronary artery disease    Cardiac  catheterization September 2022 - [facility]   Nonischemic cardiomyopathy [facility])    PAD (peripheral artery disease) (HCC)    Thrombocytopenia ([place])    Type 2 diabetes mellitus ([place])     Past Surgical History:  Procedure Laterality Date   ABDOMINAL AORTOGRAM W/LOWER EXTREMITY N/A 02/06/2019   Procedure: ABDOMINAL AORTOGRAM W/LOWER EXTREMITY;  Surgeon: [name], MD;  Location: [place] [facility];  Service: Cardiovascular;  Laterality: N/A;  bilateral   ABDOMINAL AORTOGRAM W/LOWER EXTREMITY N/A 04/30/2019   Procedure: ABDOMINAL AORTOGRAM W/LOWER EXTREMITY;  Surgeon: [name], MD;  Location: [place] [facility];  Service: Cardiovascular;  Laterality: N/A;   AORTOGRAM  08/11/2019   Procedure: Aortogram;  Surgeon: [name], MD;  Location: [place];  Service: Vascular;;   ESOPHAGEAL BANDING  08/22/2019   Procedure: ESOPHAGEAL BANDING;  Surgeon: Milus Banister, MD;  Location: [facility] ENDOSCOPY;  Service: Endoscopy;;   ESOPHAGOGASTRODUODENOSCOPY N/A 08/22/2019   Procedure: ESOPHAGOGASTRODUODENOSCOPY (EGD);  Surgeon: Milus Banister, MD;  Location: [facility] ENDOSCOPY;  Service: Endoscopy;  Laterality: N/A;   FEMORAL-POPLITEAL BYPASS GRAFT Left 08/11/2019   Procedure: BYPASS LEFT FEMORAL-DISTAL POPLITEAL ARTERY USING PROPATEN GRAFT;  Surgeon: [name], MD;  Location: [place];  Service: Vascular;  Laterality: Left;   INSERTION OF DIALYSIS CATHETER Right 09/09/2019   Procedure: INSERTION OF DIALYSIS CATHETER;  Surgeon: Waynetta Sandy, MD;  Location: [place];  Service: Vascular;  Laterality: Right;   MULTIPLE TOOTH EXTRACTIONS  PERIPHERAL VASCULAR INTERVENTION  02/06/2019   Procedure: PERIPHERAL VASCULAR INTERVENTION;  Surgeon: [name], MD;  Location: [place] [facility];  Service: Cardiovascular;;  Bilateral Iliacs   PERIPHERAL VASCULAR INTERVENTION  04/30/2019   Procedure: PERIPHERAL VASCULAR INTERVENTION;  Surgeon: [name], MD;  Location: [place] [facility];  Service: Cardiovascular;;  Stent - Lt. Iliac    REMOVAL OF A DIALYSIS CATHETER Right 09/09/2019   Procedure: Removal Of A Dialysis Catheter;  Surgeon: Waynetta Sandy, MD;  Location: [place];  Service: Vascular;  Laterality: Right;   RIGHT HEART CATH N/A 12/12/2021   Procedure: RIGHT HEART CATH;  Surgeon: Larey Dresser, MD;  Location: [place] [facility];  Service: Cardiovascular;  Laterality: N/A;   ULTRASOUND GUIDANCE FOR VASCULAR ACCESS Right 09/09/2019   Procedure: Ultrasound Guidance For Vascular Access;  Surgeon: Waynetta Sandy, MD;  Location: [place];  Service: Vascular;  Laterality: Right;   VASCULAR SURGERY      Current Medications: Outpatient Medications Prior to Visit  Medication Sig Dispense Refill   acetaminophen (TYLENOL) 500 MG tablet Take 1 tablet (500 mg total) by mouth daily as needed for moderate pain or headache. 30 tablet 0   amoxicillin (AMOXIL) 500 MG capsule Take 2 capsules by mouth every 8 (eight) hours.     aspirin EC 81 MG tablet Take 81 mg by mouth daily. Swallow whole.     atorvastatin (LIPITOR) 40 MG tablet Take 1 tablet (40 mg total) by mouth daily. 90 tablet 1   ciprofloxacin (CIPRO) 750 MG tablet Take 750 mg by mouth 2 (two) times daily.     dapagliflozin propanediol (FARXIGA) 10 MG TABS tablet Take 1 tablet (10 mg total) by mouth daily. 90 tablet 1   Elastic Bandages & Supports (KNEE SUPPORT/ELASTIC/FIRM MED) MISC 1 each by Does not apply route as directed. Medium pressure compression stockings Dx: leg edema 1 each 0   furosemide (LASIX) 40 MG tablet Take 1 tablet (40 mg total) by mouth daily. 90 tablet 1   metoprolol succinate (TOPROL XL) 25 MG 24 hr tablet Take 0.5 tablets (12.5 mg total) by mouth daily. 45 tablet 1   nitroGLYCERIN (NITROSTAT) 0.4 MG SL tablet Place 0.4 mg under the tongue every 5 (five) minutes as needed for chest pain.     pregabalin (LYRICA) 25 MG capsule Take 25 mg by mouth 3 (three) times daily.      spironolactone (ALDACTONE) 25 MG tablet Take 0.5 tablets (12.5 mg total) by mouth daily. 45 tablet 1   No facility-administered medications prior to visit.     Allergies:   Other   Social History   Socioeconomic History   Marital status: Married    Spouse name: Not on file   Number of children: 2   Years of education: Not on file   Highest education level: Not on file  Occupational History   Not on file  Tobacco Use   Smoking status: Former    Packs/day: 1.00    Years: 36.00    Total pack years: 36.00    Types: Cigarettes    Quit date: 01/25/2019    Years since quitting: 3.3   Smokeless tobacco: Never  Vaping Use   Vaping Use: Never used  Substance and Sexual Activity   Alcohol use: Not Currently    Comment: per pt's wife, pt is an alcoholic but just recently stopped drinking in 04/2019   Drug use: Never   Sexual activity: Not on file  Other Topics Concern   Not on file  Social History Narrative   Not on file   Social Determinants of Health   Financial Resource Strain: Low Risk  (09/21/2019)   Overall Financial Resource Strain (CARDIA)    Difficulty of Paying Living Expenses: Not very hard  Food Insecurity: No Food Insecurity (09/21/2019)   Hunger Vital Sign    Worried About Running Out of Food in the Last Year: Never true    Ran Out of Food in the Last Year: Never true  Transportation Needs: No Transportation Needs (09/21/2019)   PRAPARE - Hydrologist (Medical): No    Lack of Transportation (Non-Medical): No  Physical Activity: Insufficiently Active (09/21/2019)   Exercise Vital Sign    Days of Exercise per Week: 7 days    Minutes of Exercise per Session: 20 min  Stress: No Stress Concern Present (09/21/2019)   [place]    Feeling of Stress : Only a little  Social Connections: Moderately Isolated (09/21/2019)   Social Connection and Isolation Panel [NHANES]     Frequency of Communication with Friends and Family: More than three times a week    Frequency of Social Gatherings with Friends and Family: Three times a week    Attends Religious Services: Never    Active Member of Clubs or Organizations: No    Attends Archivist Meetings: Never    Marital Status: Married     Family History:  The patient's ***family history includes Alcohol abuse in his brother and father; Bipolar disorder in his daughter and son; Cancer in his mother.   Review of Systems:    Please see the history of present illness.     All other systems reviewed and are otherwise negative except as noted above.   Physical Exam:    VS:  There were no vitals taken for this visit.   General: Well developed, well nourished,male appearing in no acute distress. Head: Normocephalic, atraumatic. Neck: No carotid bruits. JVD not elevated.  Lungs: Respirations regular and unlabored, without wheezes or rales.  Heart: ***Regular rate and rhythm. No S3 or S4.  No murmur, no rubs, or gallops appreciated. Abdomen: Appears non-distended. No obvious abdominal masses. Msk:  Strength and tone appear normal for age. No obvious joint deformities or effusions. Extremities: No clubbing or cyanosis. No edema.  Distal pedal pulses are 2+ bilaterally. Neuro: Alert and oriented X 3. Moves all extremities spontaneously. No focal deficits noted. Psych:  Responds to questions appropriately with a normal affect. Skin: No rashes or lesions noted  Wt Readings from Last 3 Encounters:  05/01/22 174 lb 6.4 oz (79.1 kg)  01/13/22 183 lb 3.2 oz (83.1 kg)  12/19/21 191 lb 6.4 oz (86.8 kg)        Studies/Labs Reviewed:   EKG:  EKG is*** ordered today.  The ekg ordered today demonstrates ***  Recent Labs: 12/07/2021: B Natriuretic Peptide 1,324.0 12/12/2021: TSH 6.178 12/13/2021: ALT 13 12/16/2021: Magnesium 2.1 12/19/2021: BUN 36; Creatinine, Ser 1.78; Hemoglobin 11.6; Platelets 115; Potassium 3.9;  Sodium 134   Lipid Panel No results found for: "CHOL", "TRIG", "HDL", "CHOLHDL", "VLDL", "LDLCALC", "LDLDIRECT"  Additional studies/ records that were reviewed today include:   Echocardiogram: 11/2021 IMPRESSIONS     1. Left ventricular ejection fraction, by estimation, is 20 to 25%. The  left ventricle has severely decreased function. The left ventricle  demonstrates global hypokinesis. There is moderate left ventricular  hypertrophy. Left ventricular diastolic  parameters are consistent with Grade II diastolic dysfunction  (pseudonormalization). Elevated left atrial pressure.   2. Right ventricular systolic function is mild to moderately reduced. The  right ventricular size is moderately enlarged. There is moderately  elevated pulmonary artery systolic pressure. The estimated right  ventricular systolic pressure is 79.3 mmHg.   3. Left atrial size was severely dilated.   4. Right atrial size was severely dilated.   5. A small pericardial effusion is present.   6. The mitral valve is normal in structure. Trivial mitral valve  regurgitation.   7. Tricuspid valve regurgitation is moderate.   8. The aortic valve is tricuspid. Aortic valve regurgitation is not  visualized. Aortic valve sclerosis/calcification is present, without any  evidence of aortic stenosis.   9. The inferior vena cava is dilated in size with <50% respiratory  variability, suggesting right atrial pressure of 15 mmHg.   RHC: 11/2021 1. Severely elevated right and left heart filling pressures.  2. Low cardiac output by thermodilution, normal by Fick.  3. Primarily pulmonary venous hypertension.    Needs extensive diuresis, will start milrinone with Lasix gtt ongoing.   cMRI: 11/2021 IMPRESSION: 1.  Moderately dilated LV with EF 23%, diffuse hypokinesis.   2.  Mildly dilated RV with EF 31%.   3. Diffuse delayed enhancement imaging. Suspect only nonspecific inferior RV insertion site LGE.   4. Mildly  elevated extracellular volume percentage in the septum, nonspecific.    Assessment:    No diagnosis found.   Plan:   In order of problems listed above:  1. HFrEF - His EF was at 20-25% in 06/2021 with cath showing mild, nonobstructive CAD.  He had a cardiac MRI in 11/2021 with EF estimated at 23%. - He was previously on ideal medical therapy following his hospitalization in 11/2021 but had been without several of his medications at the time of his office visit in 04/2022.  He was restarted on Farxiga and Toprol-XL while being continued on Lasix and Spironolactone.***  2. HTN - His BP is at *** during today's visit.   3. HLD - Followed by his PCP. He has remained on Atorvastatin 40 mg daily  4. PAD - Followed by Vascular Surgery at [facility] and underwent right SFA angioplasty/stenting in 01/2022.   5. Stage 3 CKD - Baseline creatinine 1.2 - 1.4. At 1.4 in 02/2022.    Shared Decision Making/Informed Consent:   {Are you ordering a CV Procedure (e.g. stress test, cath, DCCV, TEE, etc)?   Press F2        :[id number]}    Medication Adjustments/Labs and Tests Ordered: Current medicines are reviewed at length with the patient today.  Concerns regarding medicines are outlined above.  Medication changes, Labs and Tests ordered today are listed in the Patient Instructions below. There are no Patient Instructions on file for this visit.   Signed, Erma Heritage, PA-C  06/06/2022 12:26 PM    [facility] [address] [place], [place] [address] Phone: [phone] Fax: [phone]

## 2022-06-07 ENCOUNTER — Ambulatory Visit: Payer: Medicare Other | Admitting: Student

## 2022-06-08 ENCOUNTER — Other Ambulatory Visit (HOSPITAL_COMMUNITY)
Admission: RE | Admit: 2022-06-08 | Discharge: 2022-06-08 | Disposition: A | Discharge status: Home / Self Care | Payer: Medicare Other | Source: Ambulatory Visit | Attending: Cardiology | Admitting: Cardiology

## 2022-06-08 DIAGNOSIS — K219 Gastro-esophageal reflux disease without esophagitis: Secondary | ICD-10-CM | POA: Diagnosis not present

## 2022-06-08 DIAGNOSIS — I509 Heart failure, unspecified: Secondary | ICD-10-CM | POA: Diagnosis not present

## 2022-06-08 DIAGNOSIS — D696 Thrombocytopenia, unspecified: Secondary | ICD-10-CM | POA: Diagnosis not present

## 2022-06-08 DIAGNOSIS — Z7902 Long term (current) use of antithrombotics/antiplatelets: Secondary | ICD-10-CM | POA: Diagnosis not present

## 2022-06-08 DIAGNOSIS — K7460 Unspecified cirrhosis of liver: Secondary | ICD-10-CM | POA: Diagnosis not present

## 2022-06-08 DIAGNOSIS — Z7982 Long term (current) use of aspirin: Secondary | ICD-10-CM | POA: Diagnosis not present

## 2022-06-08 DIAGNOSIS — I130 Hypertensive heart and chronic kidney disease with heart failure and stage 1 through stage 4 chronic kidney disease, or unspecified chronic kidney disease: Secondary | ICD-10-CM | POA: Diagnosis not present

## 2022-06-08 DIAGNOSIS — I5022 Chronic systolic (congestive) heart failure: Secondary | ICD-10-CM | POA: Insufficient documentation

## 2022-06-08 DIAGNOSIS — Z7984 Long term (current) use of oral hypoglycemic drugs: Secondary | ICD-10-CM | POA: Diagnosis not present

## 2022-06-08 DIAGNOSIS — L89610 Pressure ulcer of right heel, unstageable: Secondary | ICD-10-CM | POA: Diagnosis not present

## 2022-06-08 DIAGNOSIS — I2510 Atherosclerotic heart disease of native coronary artery without angina pectoris: Secondary | ICD-10-CM | POA: Diagnosis not present

## 2022-06-08 DIAGNOSIS — E1140 Type 2 diabetes mellitus with diabetic neuropathy, unspecified: Secondary | ICD-10-CM | POA: Diagnosis not present

## 2022-06-08 DIAGNOSIS — N1832 Chronic kidney disease, stage 3b: Secondary | ICD-10-CM | POA: Insufficient documentation

## 2022-06-08 DIAGNOSIS — E1122 Type 2 diabetes mellitus with diabetic chronic kidney disease: Secondary | ICD-10-CM | POA: Diagnosis not present

## 2022-06-08 DIAGNOSIS — E1151 Type 2 diabetes mellitus with diabetic peripheral angiopathy without gangrene: Secondary | ICD-10-CM | POA: Diagnosis not present

## 2022-06-08 DIAGNOSIS — E039 Hypothyroidism, unspecified: Secondary | ICD-10-CM | POA: Diagnosis not present

## 2022-06-08 DIAGNOSIS — M86171 Other acute osteomyelitis, right ankle and foot: Secondary | ICD-10-CM | POA: Diagnosis not present

## 2022-06-08 LAB — BASIC METABOLIC PANEL
Anion gap: 7 (ref 5–15)
BUN: 16 mg/dL (ref 6–20)
CO2: 23 mmol/L (ref 22–32)
Calcium: 8.9 mg/dL (ref 8.9–10.3)
Chloride: 102 mmol/L (ref 98–111)
Creatinine, Ser: 1.21 mg/dL (ref 0.61–1.24)
GFR, Estimated: >60 mL/min (ref >60)
Glucose, Bld: 173 mg/dL — ABNORMAL HIGH (ref 70–99)
Potassium: 4.2 mmol/L (ref 3.5–5.1)
Sodium: 132 mmol/L — ABNORMAL LOW (ref 135–145)

## 2022-06-15 ENCOUNTER — Encounter: Payer: Self-pay | Admitting: *Deleted

## 2022-07-07 DIAGNOSIS — S91301A Unspecified open wound, right foot, initial encounter: Secondary | ICD-10-CM | POA: Diagnosis not present

## 2022-07-07 DIAGNOSIS — R000 Tachycardia, unspecified: Secondary | ICD-10-CM | POA: Diagnosis not present

## 2022-08-06 DIAGNOSIS — S91301A Unspecified open wound, right foot, initial encounter: Secondary | ICD-10-CM | POA: Diagnosis not present

## 2022-08-06 DIAGNOSIS — R000 Tachycardia, unspecified: Secondary | ICD-10-CM | POA: Diagnosis not present

## 2022-08-13 NOTE — Progress Notes (Signed)
Cardiology Office Note  Date: 08/14/2022   ID: Edward Mealing., DOB 04-19-1966, MRN [id number]  PCP:  Shelby Dubin, FNP  Cardiologist:  Nona Dell, MD Electrophysiologist:  None   Chief Complaint  Patient presents with   Cardiac follow-up    History of Present Illness: Edward Honkala. is a 56 y.o. male last seen in July.  He is here for a follow-up visit.  Reports NYHA class II dyspnea, no leg swelling, no orthopnea or PND.  We went over his medications, he is running out of most, seems to be most consistently on Toprol-XL and Aldactone per discussion today.  Also taking Lasix but has 1 pill left.  His weight up 6 pounds from July.  He is no longer following at [facility], right heel wound has healed by his report.  We discussed up titration of Toprol-XL, continuing Aldactone and Lasix, continuing [place] and attempting to reinitiate low-dose Entresto.  Most recent creatinine was 1.21 with potassium 4.2.  Past Medical History:  Diagnosis Date   Acute on chronic renal failure (HCC)    Essential hypertension    GERD (gastroesophageal reflux disease)    Hyperosmolar syndrome 2020   Hypothyroidism    Mild coronary artery disease    Cardiac catheterization September 2022 - [facility]   Nonischemic cardiomyopathy [facility])    PAD (peripheral artery disease) (HCC)    Thrombocytopenia (HCC)    Type 2 diabetes mellitus (HCC)     Past Surgical History:  Procedure Laterality Date   ABDOMINAL AORTOGRAM W/LOWER EXTREMITY N/A 02/06/2019   Procedure: ABDOMINAL AORTOGRAM W/LOWER EXTREMITY;  Surgeon: Cephus Shelling, MD;  Location: [facility];  Service: Cardiovascular;  Laterality: N/A;  bilateral   ABDOMINAL AORTOGRAM W/LOWER EXTREMITY N/A 04/30/2019   Procedure: ABDOMINAL AORTOGRAM W/LOWER EXTREMITY;  Surgeon: Cephus Shelling, MD;  Location: [facility] CV [facility];  Service: Cardiovascular;  Laterality: N/A;   AORTOGRAM  08/11/2019   Procedure: Aortogram;  Surgeon: Cephus Shelling, MD;  Location: [facility] OR;  Service: Vascular;;   ESOPHAGEAL BANDING  08/22/2019   Procedure: ESOPHAGEAL BANDING;  Surgeon: Rachael Fee, MD;  Location: [facility] ENDOSCOPY;  Service: Endoscopy;;   ESOPHAGOGASTRODUODENOSCOPY N/A 08/22/2019   Procedure: ESOPHAGOGASTRODUODENOSCOPY (EGD);  Surgeon: Rachael Fee, MD;  Location: [facility] ENDOSCOPY;  Service: Endoscopy;  Laterality: N/A;   FEMORAL-POPLITEAL BYPASS GRAFT Left 08/11/2019   Procedure: BYPASS LEFT FEMORAL-DISTAL POPLITEAL ARTERY USING PROPATEN GRAFT;  Surgeon: Cephus Shelling, MD;  Location: [facility] OR;  Service: Vascular;  Laterality: Left;   INSERTION OF DIALYSIS CATHETER Right 09/09/2019   Procedure: INSERTION OF DIALYSIS CATHETER;  Surgeon: Maeola Harman, MD;  Location: [facility] OR;  Service: Vascular;  Laterality: Right;   MULTIPLE TOOTH EXTRACTIONS     PERIPHERAL VASCULAR INTERVENTION  02/06/2019   Procedure: PERIPHERAL VASCULAR INTERVENTION;  Surgeon: Cephus Shelling, MD;  Location: [facility];  Service: Cardiovascular;;  Bilateral Iliacs   PERIPHERAL VASCULAR INTERVENTION  04/30/2019   Procedure: PERIPHERAL VASCULAR INTERVENTION;  Surgeon: Cephus Shelling, MD;  Location: [facility];  Service: Cardiovascular;;  Stent - Lt. Iliac    REMOVAL OF A DIALYSIS CATHETER Right 09/09/2019   Procedure: Removal Of A Dialysis Catheter;  Surgeon: Maeola Harman, MD;  Location: [facility] OR;  Service: Vascular;  Laterality: Right;   RIGHT HEART CATH N/A 12/12/2021   Procedure: RIGHT HEART CATH;  Surgeon: Laurey Morale, MD;  Location: [facility] INVASIVE CV LAB;  Service:  Cardiovascular;  Laterality: N/A;   ULTRASOUND GUIDANCE FOR VASCULAR ACCESS Right 09/09/2019   Procedure: Ultrasound Guidance For Vascular Access;  Surgeon: Maeola Harman, MD;  Location: [facility] OR;  Service: Vascular;  Laterality: Right;   VASCULAR SURGERY      Current Outpatient Medications  Medication Sig Dispense Refill    acetaminophen (TYLENOL) 500 MG tablet Take 1 tablet (500 mg total) by mouth daily as needed for moderate pain or headache. 30 tablet 0   aspirin EC 81 MG tablet Take 81 mg by mouth daily. Swallow whole.     Elastic Bandages & Supports (KNEE SUPPORT/ELASTIC/FIRM MED) MISC 1 each by Does not apply route as directed. Medium pressure compression stockings Dx: leg edema 1 each 0   nitroGLYCERIN (NITROSTAT) 0.4 MG SL tablet Place 0.4 mg under the tongue every 5 (five) minutes as needed for chest pain.     pregabalin (LYRICA) 25 MG capsule Take 25 mg by mouth 3 (three) times daily.     sacubitril-valsartan (ENTRESTO) 24-26 MG Take 1 tablet by mouth 2 (two) times daily. 60 tablet 6   atorvastatin (LIPITOR) 40 MG tablet Take 1 tablet (40 mg total) by mouth daily. 90 tablet 1   dapagliflozin propanediol (FARXIGA) 10 MG TABS tablet Take 1 tablet (10 mg total) by mouth daily. 90 tablet 1   furosemide (LASIX) 40 MG tablet Take 1 tablet (40 mg total) by mouth daily. 90 tablet 1   metoprolol succinate (TOPROL XL) 25 MG 24 hr tablet Take 1 tablet (25 mg total) by mouth daily. 90 tablet 1   spironolactone (ALDACTONE) 25 MG tablet Take 0.5 tablets (12.5 mg total) by mouth daily. 45 tablet 1   No current facility-administered medications for this visit.   Allergies:  Other   Social History: The patient  reports that he quit smoking about 3 years ago. His smoking use included cigarettes. He has a 36.00 pack-year smoking history. He has never been exposed to tobacco smoke. He has never used smokeless tobacco. He reports that he does not currently use alcohol. He reports that he does not use drugs.   Family History: The patient's family history includes Alcohol abuse in his brother and father; Bipolar disorder in his daughter and son; Cancer in his mother.   ROS: No syncope.  Physical Exam: VS:  BP (!) 142/98 (BP Location: Left Arm, Patient Position: Sitting, Cuff Size: Normal)   Pulse (!) 108   Ht 6\' 1"  (1.854  m)   Wt 180 lb (81.6 kg)   SpO2 100%   BMI 23.75 kg/m , BMI Body mass index is 23.75 kg/m.  Wt Readings from Last 3 Encounters:  08/14/22 180 lb (81.6 kg)  05/01/22 174 lb 6.4 oz (79.1 kg)  01/13/22 183 lb 3.2 oz (83.1 kg)    General: Patient appears comfortable at rest. HEENT: Conjunctiva and lids normal. Neck: Supple, no elevated JVP or carotid bruits. Lungs: Clear to auscultation, nonlabored breathing at rest. Cardiac: Regular rate and rhythm, no S3, 1/6 systolic murmur. Abdomen: Soft, nontender, bowel sounds present. Extremities: No pitting edema. Skin: Warm and dry. Musculoskeletal: No kyphosis. Neuropsychiatric: Alert and oriented x3, affect grossly appropriate.  ECG:  An ECG dated 12/13/2021 was personally reviewed today and demonstrated:  Ectopic atrial tachycardia with LVH and repolarization abnormalities.  Recent Labwork: 12/07/2021: B Natriuretic Peptide 1,324.0 12/12/2021: TSH 6.178 12/13/2021: ALT 13; AST 31 12/16/2021: Magnesium 2.1 12/19/2021: Hemoglobin 11.6; Platelets 115 06/08/2022: BUN 16; Creatinine, Ser 1.21; Potassium 4.2; Sodium 132  Other Studies Reviewed Today:  Echocardiogram 12/12/2021:  1. Left ventricular ejection fraction, by estimation, is 20 to 25%. The  left ventricle has severely decreased function. The left ventricle  demonstrates global hypokinesis. There is moderate left ventricular  hypertrophy. Left ventricular diastolic  parameters are consistent with Grade II diastolic dysfunction  (pseudonormalization). Elevated left atrial pressure.   2. Right ventricular systolic function is mild to moderately reduced. The  right ventricular size is moderately enlarged. There is moderately  elevated pulmonary artery systolic pressure. The estimated right  ventricular systolic pressure is 51.7 mmHg.   3. Left atrial size was severely dilated.   4. Right atrial size was severely dilated.   5. A small pericardial effusion is present.   6. The mitral  valve is normal in structure. Trivial mitral valve  regurgitation.   7. Tricuspid valve regurgitation is moderate.   8. The aortic valve is tricuspid. Aortic valve regurgitation is not  visualized. Aortic valve sclerosis/calcification is present, without any  evidence of aortic stenosis.   9. The inferior vena cava is dilated in size with <50% respiratory  variability, suggesting right atrial pressure of 15 mmHg.    Right heart catheterization 12/12/2021: RA mean 22 RV 79/28 PA 67/32, mean 42 PCWP mean 25  Oxygen saturations: PA 68% AO 100%  Cardiac Output (Fick) 5.55  Cardiac Index (Fick) 2.43 PVR 3 WU  Cardiac Output (Thermo) 3.64 Cardiac Index (Thermo) 1.59  PVR 4.7 WU   1. Severely elevated right and left heart filling pressures.  2. Low cardiac output by thermodilution, normal by Fick.  3. Primarily pulmonary venous hypertension.    Cardiac MRI 12/16/2021: IMPRESSION: 1.  Moderately dilated LV with EF 23%, diffuse hypokinesis.   2.  Mildly dilated RV with EF 31%.   3. Diffuse delayed enhancement imaging. Suspect only nonspecific inferior RV insertion site LGE.   4. Mildly elevated extracellular volume percentage in the septum, nonspecific.  Assessment and Plan:  1.  HFrEF with nonischemic cardiomyopathy, LVEF 20 to 25% range by last evaluation.  Still trying to work toward consistent GDMT as discussed above.  Increase Toprol-XL to 25 mg daily, continue Aldactone and Lasix, continue Farxiga, attempt readdition of Entresto 24/26 mg twice daily with follow-up BMET in 10 days.  Still holding off repeat assessment of LVEF until on consistent medical regimen.  2.  History of ectopic atrial tachycardia, heart rate is up today.  Increasing beta-blocker, recheck ECG for next visit.  Could be component of tachycardia induced cardiomyopathy.  May need to consider addition of amiodarone.  3.  CKD stage IIIb, last creatinine improved to 1.21.  4. PAD status post left femoral  to popliteal bypass and iliac stenting.  No longer requiring wound clinic follow-up at [facility] for right heel which has improved.  Continue aspirin and resume Lipitor.  Medication Adjustments/Labs and Tests Ordered: Current medicines are reviewed at length with the patient today.  Concerns regarding medicines are outlined above.   Tests Ordered: Orders Placed This Encounter  Procedures   Basic metabolic panel    Medication Changes: Meds ordered this encounter  Medications   metoprolol succinate (TOPROL XL) 25 MG 24 hr tablet    Sig: Take 1 tablet (25 mg total) by mouth daily.    Dispense:  90 tablet    Refill:  1    08/14/2022 dose increase   sacubitril-valsartan (ENTRESTO) 24-26 MG    Sig: Take 1 tablet by mouth 2 (two) times daily.    Dispense:  60 tablet    Refill:  6    08/14/2022 NEW   spironolactone (ALDACTONE) 25 MG tablet    Sig: Take 0.5 tablets (12.5 mg total) by mouth daily.    Dispense:  45 tablet    Refill:  1   furosemide (LASIX) 40 MG tablet    Sig: Take 1 tablet (40 mg total) by mouth daily.    Dispense:  90 tablet    Refill:  1   dapagliflozin propanediol (FARXIGA) 10 MG TABS tablet    Sig: Take 1 tablet (10 mg total) by mouth daily.    Dispense:  90 tablet    Refill:  1   atorvastatin (LIPITOR) 40 MG tablet    Sig: Take 1 tablet (40 mg total) by mouth daily.    Dispense:  90 tablet    Refill:  1    Disposition:  Follow up  6 weeks.  Signed, Jonelle Sidle, MD, [facility] 08/14/2022 10:55 AM    [facility] at [facility] [address] [place], [place], [place] [address] Phone: [phone]; Fax: [phone]

## 2022-08-14 ENCOUNTER — Ambulatory Visit: Payer: Medicare Other | Attending: Student | Admitting: Cardiology

## 2022-08-14 ENCOUNTER — Encounter: Payer: Self-pay | Admitting: Cardiology

## 2022-08-14 VITALS — BP 142/98 | HR 108 | Ht 73.0 in | Wt 180.0 lb

## 2022-08-14 DIAGNOSIS — I4719 Other supraventricular tachycardia: Secondary | ICD-10-CM | POA: Diagnosis not present

## 2022-08-14 DIAGNOSIS — I5020 Unspecified systolic (congestive) heart failure: Secondary | ICD-10-CM

## 2022-08-14 DIAGNOSIS — I739 Peripheral vascular disease, unspecified: Secondary | ICD-10-CM

## 2022-08-14 DIAGNOSIS — Z79899 Other long term (current) drug therapy: Secondary | ICD-10-CM | POA: Diagnosis not present

## 2022-08-14 DIAGNOSIS — N1832 Chronic kidney disease, stage 3b: Secondary | ICD-10-CM | POA: Diagnosis not present

## 2022-08-14 MED ORDER — ATORVASTATIN CALCIUM 40 MG PO TABS: 40.0000 mg | ORAL_TABLET | Freq: Every day | ORAL | 1 refills | Status: DC

## 2022-08-14 MED ORDER — SPIRONOLACTONE 25 MG PO TABS: 12.5000 mg | ORAL_TABLET | Freq: Every day | ORAL | 1 refills | Status: DC

## 2022-08-14 MED ORDER — FUROSEMIDE 40 MG PO TABS: 40.0000 mg | ORAL_TABLET | Freq: Every day | ORAL | 1 refills | Status: DC

## 2022-08-14 MED ORDER — ENTRESTO 24-26 MG PO TABS: 1.0000 | ORAL_TABLET | Freq: Two times a day (BID) | ORAL | 6 refills | Status: DC

## 2022-08-14 MED ORDER — METOPROLOL SUCCINATE ER 25 MG PO TB24: 25.0000 mg | ORAL_TABLET | Freq: Every day | ORAL | 1 refills | Status: DC

## 2022-08-14 MED ORDER — DAPAGLIFLOZIN PROPANEDIOL 10 MG PO TABS: 10.0000 mg | ORAL_TABLET | Freq: Every day | ORAL | 1 refills | Status: DC

## 2022-08-14 NOTE — Patient Instructions (Addendum)
Medication Instructions:  Your physician has recommended you make the following change in your medication:  Increase metoprolol succinate to 25 mg daily Start entresto 24/26 mg twice daily Continue other medications the same  Labwork: BMET in 2 weeks around (08/28/22) Non-fasting [facility] or [employer]  Testing/Procedures: none  Follow-Up: Your physician recommends that you schedule a follow-up appointment in: 6 weeks  Any Other Special Instructions Will Be Listed Below (If Applicable).  If you need a refill on your cardiac medications before your next appointment, please call your pharmacy.

## 2022-09-06 DIAGNOSIS — S91301A Unspecified open wound, right foot, initial encounter: Secondary | ICD-10-CM | POA: Diagnosis not present

## 2022-09-06 DIAGNOSIS — R000 Tachycardia, unspecified: Secondary | ICD-10-CM | POA: Diagnosis not present

## 2022-09-26 DIAGNOSIS — S91309A Unspecified open wound, unspecified foot, initial encounter: Secondary | ICD-10-CM | POA: Diagnosis not present

## 2022-09-26 DIAGNOSIS — I509 Heart failure, unspecified: Secondary | ICD-10-CM | POA: Diagnosis not present

## 2022-09-26 DIAGNOSIS — E119 Type 2 diabetes mellitus without complications: Secondary | ICD-10-CM | POA: Diagnosis not present

## 2022-09-26 DIAGNOSIS — Z7182 Exercise counseling: Secondary | ICD-10-CM | POA: Diagnosis not present

## 2022-09-26 DIAGNOSIS — I10 Essential (primary) hypertension: Secondary | ICD-10-CM | POA: Diagnosis not present

## 2022-09-26 DIAGNOSIS — E559 Vitamin D deficiency, unspecified: Secondary | ICD-10-CM | POA: Diagnosis not present

## 2022-09-26 DIAGNOSIS — Z23 Encounter for immunization: Secondary | ICD-10-CM | POA: Diagnosis not present

## 2022-09-26 DIAGNOSIS — Z713 Dietary counseling and surveillance: Secondary | ICD-10-CM | POA: Diagnosis not present

## 2022-09-26 DIAGNOSIS — R0989 Other specified symptoms and signs involving the circulatory and respiratory systems: Secondary | ICD-10-CM | POA: Diagnosis not present

## 2022-09-26 DIAGNOSIS — E785 Hyperlipidemia, unspecified: Secondary | ICD-10-CM | POA: Diagnosis not present

## 2022-09-28 ENCOUNTER — Encounter: Payer: Self-pay | Admitting: *Deleted

## 2022-09-28 NOTE — Patient Instructions (Signed)
  Procedure: colonoscopy  Estimated body mass index is 24.14 kg/m as calculated from the following:   Height as of this encounter: 6\' 1"  (1.854 m).   Weight as of this encounter: 183 lb (83 kg).   Have you had a colonoscopy before?  no  Do you have family history of colon cancer?  no  Do you have a family history of polyps? no  Previous colonoscopy with polyps removed? no  Do you have a history colorectal cancer?   no  Are you diabetic?  Type 2  Do you have a prosthetic or mechanical heart valve? no  Do you have a pacemaker/defibrillator?   no  Have you had endocarditis/atrial fibrillation?  no  Do you use supplemental oxygen/CPAP?  no  Have you had joint replacement within the last 12 months?  no  Do you tend to be constipated or have to use laxatives?  no   Do you have history of alcohol use? If yes, how much and how often.  no  Do you have history or are you using drugs? If yes, what do are you  using?  no  Have you ever had a stroke/heart attack?  no  Have you ever had a heart or other vascular stent placed,? Yes, 2019  Do you take weight loss medication? no  Do you take any blood-thinning medications such as: (Plavix, aspirin, Coumadin, Aggrenox, Brilinta, Xarelto, Eliquis, Pradaxa, Savaysa or Effient)? Aspirin  If yes we need the name, milligram, dosage and who is prescribing doctor:               Current Outpatient Medications  Medication Sig Dispense Refill   acetaminophen (TYLENOL) 500 MG tablet Take 1 tablet (500 mg total) by mouth daily as needed for moderate pain or headache. 30 tablet 0   aspirin EC 81 MG tablet Take 81 mg by mouth daily. Swallow whole.     atorvastatin (LIPITOR) 40 MG tablet Take 1 tablet (40 mg total) by mouth daily. 90 tablet 1   dapagliflozin propanediol (FARXIGA) 10 MG TABS tablet Take 1 tablet (10 mg total) by mouth daily. 90 tablet 1   Elastic Bandages & Supports (KNEE SUPPORT/ELASTIC/FIRM MED) MISC 1 each by Does not apply  route as directed. Medium pressure compression stockings Dx: leg edema 1 each 0   furosemide (LASIX) 40 MG tablet Take 1 tablet (40 mg total) by mouth daily. 90 tablet 1   metoprolol succinate (TOPROL XL) 25 MG 24 hr tablet Take 1 tablet (25 mg total) by mouth daily. 90 tablet 1   nitroGLYCERIN (NITROSTAT) 0.4 MG SL tablet Place 0.4 mg under the tongue every 5 (five) minutes as needed for chest pain.     pregabalin (LYRICA) 25 MG capsule Take 25 mg by mouth 3 (three) times daily.     sacubitril-valsartan (ENTRESTO) 24-26 MG Take 1 tablet by mouth 2 (two) times daily. 60 tablet 6   spironolactone (ALDACTONE) 25 MG tablet Take 0.5 tablets (12.5 mg total) by mouth daily. 45 tablet 1   No current facility-administered medications for this visit.    Allergies  Allergen Reactions   Other     Pt received platelets and had a bad reaction from infusion

## 2022-10-06 DIAGNOSIS — S91301A Unspecified open wound, right foot, initial encounter: Secondary | ICD-10-CM | POA: Diagnosis not present

## 2022-10-06 DIAGNOSIS — R000 Tachycardia, unspecified: Secondary | ICD-10-CM | POA: Diagnosis not present

## 2022-10-10 ENCOUNTER — Encounter: Payer: Self-pay | Admitting: *Deleted

## 2022-10-11 DIAGNOSIS — I6523 Occlusion and stenosis of bilateral carotid arteries: Secondary | ICD-10-CM | POA: Diagnosis not present

## 2022-10-11 DIAGNOSIS — R0989 Other specified symptoms and signs involving the circulatory and respiratory systems: Secondary | ICD-10-CM | POA: Diagnosis not present

## 2022-10-11 NOTE — Progress Notes (Unsigned)
Cardiology Office Note:    Date:  10/12/2022   ID:  Edward Patricia., DOB 03/29/1966, MRN [id number]  PCP:  Olga Coaster, [facility] Cardiologist:  Rozann Lesches, MD     Referring MD: Olga Coaster, FNP   CC: Here for CHF follow-up  History of Present Illness:    Edward Jefferys. is a 56 y.o. male with a hx of the following:  Mild CAD PAD, s/p left femoral to popliteal bypass and iliac stenting HTN Low platelets T2DM Hypothyroidism HFrEF, NICM   Patient is a 56 year old male with past medical history as mentioned above.  He presented to establish care with cardiology after hospital discharge on July 22, 2021 after he was found to be in acute combined congestive heart failure with newly diagnosed cardiomyopathy, EF 20 to 25% with diffuse hypokinesis of left ventricle.  He had mild elevation of his cardiac enzymes that was associated with heart failure.  Underwent cardiac catheterization that revealed mild nonobstructive CAD, was treated medically.  Hospital course was notable for renal insufficiency, acute on chronic, with CKD stage III at baseline.  Cardiac MRI did not suggest viral or infiltrative etiologies.   After hospital discharge, he was coming off diuretics as he was given only 1 week of Lasix and started to feel worse, became short of breath, and presented to [facility], ED and found to have bilateral pleural effusions.  Given a prescription for Lasix 20 mg daily.    Has been closely followed by Dr. Domenic Polite.  Last seen by Dr. Domenic Polite on August 14, 2022.  Overall he was doing well from a cardiac perspective and denied any shortness of breath or leg swelling.  Toprol-XL was increased to 25 mg daily, was started on low-dose Entresto with close follow-up of BMET.  Increased beta-blocker due to heart rate at 108 in office that day, previous history of ectopic atrial tachycardia.  Dr. Domenic Polite also stated may need to consider amiodarone in the  future.  Today he presents for follow-up evaluation.  He states he is doing well.  Denies any chest pain, shortness of breath, palpitations, syncope, presyncope, dizziness, orthopnea, PND, acute bleeding, or claudication.  States he is taking all of his medications except for [facility], had difficulty with trying to get [place] from pharmacy.  He states he does not check his weight daily, sometimes has some salty foods, but overall says he does not add any salt to his foods.  Systolic blood pressures are averaging around 130s.  Blood pressure is up today in office and he states he has not taken any of his morning medications but normally does every day.  Denies any other questions or concerns today.  Past Medical History:  Diagnosis Date   Acute on chronic renal failure (HCC)    Essential hypertension    GERD (gastroesophageal reflux disease)    Hyperosmolar syndrome 2020   Hypothyroidism    Mild coronary artery disease    Cardiac catheterization September 2022 - [facility]   Nonischemic cardiomyopathy [facility])    PAD (peripheral artery disease) (HCC)    Thrombocytopenia ([place])    Type 2 diabetes mellitus ([place])     Past Surgical History:  Procedure Laterality Date   ABDOMINAL AORTOGRAM W/LOWER EXTREMITY N/A 02/06/2019   Procedure: ABDOMINAL AORTOGRAM W/LOWER EXTREMITY;  Surgeon: [name], MD;  Location: [place] [facility];  Service: Cardiovascular;  Laterality: N/A;  bilateral   ABDOMINAL AORTOGRAM W/LOWER EXTREMITY N/A  04/30/2019   Procedure: ABDOMINAL AORTOGRAM W/LOWER EXTREMITY;  Surgeon: [name], MD;  Location: [place] [facility];  Service: Cardiovascular;  Laterality: N/A;   AORTOGRAM  08/11/2019   Procedure: Aortogram;  Surgeon: [name], MD;  Location: [place];  Service: Vascular;;   ESOPHAGEAL BANDING  08/22/2019   Procedure: ESOPHAGEAL BANDING;  Surgeon: Milus Banister, MD;  Location[facility] ENDOSCOPY;  Service: Endoscopy;;   ESOPHAGOGASTRODUODENOSCOPY N/A  08/22/2019   Procedure: ESOPHAGOGASTRODUODENOSCOPY (EGD);  Surgeon: Milus Banister, MD;  Location: [facility] ENDOSCOPY;  Service: Endoscopy;  Laterality: N/A;   FEMORAL-POPLITEAL BYPASS GRAFT Left 08/11/2019   Procedure: BYPASS LEFT FEMORAL-DISTAL POPLITEAL ARTERY USING PROPATEN GRAFT;  Surgeon: [name], MD;  Location: [place];  Service: Vascular;  Laterality: Left;   INSERTION OF DIALYSIS CATHETER Right 09/09/2019   Procedure: INSERTION OF DIALYSIS CATHETER;  Surgeon: Waynetta Sandy, MD;  Location: [place];  Service: Vascular;  Laterality: Right;   MULTIPLE TOOTH EXTRACTIONS     PERIPHERAL VASCULAR INTERVENTION  02/06/2019   Procedure: PERIPHERAL VASCULAR INTERVENTION;  Surgeon: [name], MD;  Location: [place] [facility];  Service: Cardiovascular;;  Bilateral Iliacs   PERIPHERAL VASCULAR INTERVENTION  04/30/2019   Procedure: PERIPHERAL VASCULAR INTERVENTION;  Surgeon: [name], MD;  Location: [place] [facility];  Service: Cardiovascular;;  Stent - Lt. Iliac    REMOVAL OF A DIALYSIS CATHETER Right 09/09/2019   Procedure: Removal Of A Dialysis Catheter;  Surgeon: Waynetta Sandy, MD;  Location: [place];  Service: Vascular;  Laterality: Right;   RIGHT HEART CATH N/A 12/12/2021   Procedure: RIGHT HEART CATH;  Surgeon: Larey Dresser, MD;  Location: [place] [facility];  Service: Cardiovascular;  Laterality: N/A;   ULTRASOUND GUIDANCE FOR VASCULAR ACCESS Right 09/09/2019   Procedure: Ultrasound Guidance For Vascular Access;  Surgeon: Waynetta Sandy, MD;  Location: [place];  Service: Vascular;  Laterality: Right;   VASCULAR SURGERY      Current Medications: Current Meds  Medication Sig   acetaminophen (TYLENOL) 500 MG tablet Take 1 tablet (500 mg total) by mouth daily as needed for moderate pain or headache.   aspirin EC 81 MG tablet Take 81 mg by mouth daily. Swallow whole.   atorvastatin (LIPITOR) 40 MG tablet Take 1 tablet (40 mg total) by  mouth daily.   dapagliflozin propanediol (FARXIGA) 10 MG TABS tablet Take 1 tablet (10 mg total) by mouth daily.   Elastic Bandages & Supports (KNEE SUPPORT/ELASTIC/FIRM MED) MISC 1 each by Does not apply route as directed. Medium pressure compression stockings Dx: leg edema   ergocalciferol (VITAMIN D2) 1.25 MG (50000 UT) capsule Take 50,000 Units by mouth once a week.   furosemide (LASIX) 40 MG tablet Take 1 tablet (40 mg total) by mouth daily.   metoprolol succinate (TOPROL XL) 25 MG 24 hr tablet Take 1 tablet (25 mg total) by mouth daily.   nitroGLYCERIN (NITROSTAT) 0.4 MG SL tablet Place 0.4 mg under the tongue every 5 (five) minutes as needed for chest pain.   pregabalin (LYRICA) 25 MG capsule Take 25 mg by mouth 3 (three) times daily.   spironolactone (ALDACTONE) 25 MG tablet Take 0.5 tablets (12.5 mg total) by mouth daily.     Allergies:   Other   Social History   Socioeconomic History   Marital status: Married    Spouse name: Not on file   Number of children: 2   Years of education: Not on file  Highest education level: Not on file  Occupational History   Not on file  Tobacco Use   Smoking status: Former    Packs/day: 1.00    Years: 36.00    Total pack years: 36.00    Types: Cigarettes    Quit date: 01/25/2019    Years since quitting: 3.7    Passive exposure: Never   Smokeless tobacco: Never  Vaping Use   Vaping Use: Never used  Substance and Sexual Activity   Alcohol use: Not Currently    Comment: per pt's wife, pt is an alcoholic but just recently stopped drinking in 04/2019   Drug use: Never   Sexual activity: Not on file  Other Topics Concern   Not on file  Social History Narrative   Not on file   Social Determinants of Health   Financial Resource Strain: Low Risk  (09/21/2019)   Overall Financial Resource Strain (CARDIA)    Difficulty of Paying Living Expenses: Not very hard  Food Insecurity: No Food Insecurity (09/21/2019)   Hunger Vital Sign     Worried About Running Out of Food in the Last Year: Never true    Ran Out of Food in the Last Year: Never true  Transportation Needs: No Transportation Needs (09/21/2019)   PRAPARE - Hydrologist (Medical): No    Lack of Transportation (Non-Medical): No  Physical Activity: Insufficiently Active (09/21/2019)   Exercise Vital Sign    Days of Exercise per Week: 7 days    Minutes of Exercise per Session: 20 min  Stress: No Stress Concern Present (09/21/2019)   [place]    Feeling of Stress : Only a little  Social Connections: Moderately Isolated (09/21/2019)   Social Connection and Isolation Panel [NHANES]    Frequency of Communication with Friends and Family: More than three times a week    Frequency of Social Gatherings with Friends and Family: Three times a week    Attends Religious Services: Never    Active Member of Clubs or Organizations: No    Attends Archivist Meetings: Never    Marital Status: Married     Family History: The patient's family history includes Alcohol abuse in his brother and father; Bipolar disorder in his daughter and son; Cancer in his mother.  ROS:   Review of Systems  Constitutional: Negative.   HENT: Negative.    Eyes: Negative.   Respiratory: Negative.    Cardiovascular:  Positive for leg swelling. Negative for chest pain, palpitations, orthopnea, claudication and PND.       Does admit to lower extremity swelling that he has noticed recently due to walking and being on his feet.  Gastrointestinal: Negative.   Genitourinary: Negative.   Musculoskeletal: Negative.   Skin: Negative.   Neurological: Negative.   Endo/Heme/Allergies: Negative.   Psychiatric/Behavioral: Negative.      Please see the history of present illness.    All other systems reviewed and are negative.  EKGs/Labs/Other Studies Reviewed:    The following studies were  reviewed today:   EKG:  EKG is not ordered today.    Cardiac MRI on December 16, 2021: IMPRESSION: 1.  Moderately dilated LV with EF 23%, diffuse hypokinesis.   2.  Mildly dilated RV with EF 31%.   3. Diffuse delayed enhancement imaging. Suspect only nonspecific inferior RV insertion site LGE.   4. Mildly elevated extracellular volume percentage in the septum, nonspecific.  Right  heart cath on December 12, 2021: 1. Severely elevated right and left heart filling pressures.  2. Low cardiac output by thermodilution, normal by [place].  3. Primarily pulmonary venous hypertension.    Needs extensive diuresis, will start milrinone with Lasix gtt ongoing.   2D echocardiogram on December 12, 2021: 1. Left ventricular ejection fraction, by estimation, is 20 to 25%. The  left ventricle has severely decreased function. The left ventricle  demonstrates global hypokinesis. There is moderate left ventricular  hypertrophy. Left ventricular diastolic  parameters are consistent with Grade II diastolic dysfunction  (pseudonormalization). Elevated left atrial pressure.   2. Right ventricular systolic function is mild to moderately reduced. The  right ventricular size is moderately enlarged. There is moderately  elevated pulmonary artery systolic pressure. The estimated right  ventricular systolic pressure is 94.8 mmHg.   3. Left atrial size was severely dilated.   4. Right atrial size was severely dilated.   5. A small pericardial effusion is present.   6. The mitral valve is normal in structure. Trivial mitral valve  regurgitation.   7. Tricuspid valve regurgitation is moderate.   8. The aortic valve is tricuspid. Aortic valve regurgitation is not  visualized. Aortic valve sclerosis/calcification is present, without any  evidence of aortic stenosis.   9. The inferior vena cava is dilated in size with <50% respiratory  variability, suggesting right atrial pressure of 15 mmHg.  Vascular  ultrasound lower Doppler bilateral on October 19, 2019: IMPRESSION: 1. No evidence of deep venous thrombosis in either lower extremity. 2. In the left inguinal region there is a large mildly complex cystic fluid collection measuring 6.4 x 5.3 x 3.6 cm. Differential considerations are broad. If the patient has had prior surgery in this region, this could represent a seroma, lymphocele or the liquified remnants of a prior hematoma. Alternately, this could represent centrally necrotic lymphadenopathy if the patient has a known history of squamous cell carcinoma.   Recent Labs: 12/12/2021: TSH 6.178 12/13/2021: ALT 13 12/16/2021: Magnesium 2.1 12/19/2021: Hemoglobin 11.6; Platelets 115 10/12/2022: B Natriuretic Peptide 2,131.0; BUN 15; Creatinine, Ser 1.41; Potassium 4.1; Sodium 133  Recent Lipid Panel No results found for: "CHOL", "TRIG", "HDL", "CHOLHDL", "VLDL", "LDLCALC", "LDLDIRECT"  Physical Exam:    VS:  BP (!) 142/98   Pulse 83   Ht _0  (1.854 m)   Wt 191 lb 12.8 oz (87 kg)   SpO2 99%   BMI 25.30 kg/m     Wt Readings from Last 3 Encounters:  10/12/22 191 lb 12.8 oz (87 kg)  09/28/22 183 lb (83 kg)  08/14/22 180 lb (81.6 kg)     GEN: Well nourished, well developed 56 y.o. male in no acute distress HEENT: Normal NECK: Minimal dilation to neck veins bilateral (L>R) ; No carotid bruits CARDIAC: S1/S2, RRR, no murmurs, rubs, gallops; 2+ peripheral pulses throughout, strong and equal bilaterally RESPIRATORY:  Clear and diminished to auscultation without rales, wheezing or rhonchi  MUSCULOSKELETAL: 2+ pitting edema along BLE; No deformity  SKIN: Warm and dry NEUROLOGIC:  Alert and oriented x 3 PSYCHIATRIC:  Normal affect   ASSESSMENT:    1. Chronic combined systolic and diastolic heart failure ([place])   2. NICM (nonischemic cardiomyopathy) ([place])   3. Medication management   4. Coronary artery disease involving native heart without angina pectoris, unspecified vessel or  lesion type   5. Ectopic atrial tachycardia   6. PAD (peripheral artery disease) ([place])   7. Hypertension, unspecified type   8.  Stage 3b chronic kidney disease ([place])   9. Pulmonary hypertension, unspecified ([place])    PLAN:    In order of problems listed above:  Heart failure with reduced ejection fraction, NICM, medication management Echocardiogram in February 2023 revealed EF 20 to 25%, global hypokinesis of left ventricle, grade 2 DD, moderate LVH. Right heart cath revealed severely elevated right and left heart filling pressures with primarily pulmonary venous hypertension.  Cardiac MRI revealed EF around 23% did not reveal sarcoidosis or amyloidosis.  No viral etiology seen on cardiac MRI.  Does present with minimal, dilated neck veins bilaterally, left greater than right, does not appear short of breath on exam, lungs are clear and diminished and does have 2+ pitting edema along lower extremities bilaterally.  Weight is up 11 pounds from 1 month ago.  I suspect he is holding onto some fluid, he is in no acute distress.  Will obtain proBNP and BMET, and if kidney function permits, will uptitrate Lasix as he possibly needs diuresis and follow back up with him in office.  Then, next consider restarting Entresto.  Continue Farxiga, Aldactone, and Toprol-XL. Low sodium diet, fluid restriction <2L, and daily weights encouraged. Educated to contact our office for weight gain of 2 lbs overnight or 5 lbs in one week. Heart healthy diet and regular cardiovascular exercise encouraged.  ED precautions discussed.  Mild CAD Stable with no anginal symptoms. No indication for ischemic evaluation.  Continue aspirin, Lipitor, Toprol-XL, and nitroglycerin as needed. Heart healthy diet and regular cardiovascular exercise encouraged.   History of ectopic atrial tachycardia Denies any tachycardia or palpitations.  Heart rate is 83 on exam.  Continue Toprol-XL.  May possibly need to consider amiodarone in future if  needed.  Tachycardia could be a possible etiology for his new onset cardiomyopathy;  however he is not tachycardic on exam today.  Continue current medication regimen.Heart healthy diet and regular cardiovascular exercise encouraged.   PAD, s/p left femoral to popliteal bypass and iliac stenting Denies any claudication symptoms.  Does have some bilateral lower extremity edema as mentioned above.  Continue current medication regimen. Heart healthy diet and regular cardiovascular exercise encouraged.   Hypertension Blood pressure elevated today in office.  He states he has not taken his morning medications.  Medication compliance discussed.  He states he is compliant with his medications. Discussed to monitor BP at home at least 2 hours after medications and sitting for 5-10 minutes.  Continue Toprol-XL, Aldactone, and possibly reinitiate Entresto at next office visit. Heart healthy diet and regular cardiovascular exercise encouraged.  Will obtain BMET as mentioned above.  CKD stage IIIb He still has a pending BMET from last office visit with Dr. Domenic Polite.  Will obtain BMET at this time.  Kidney function in August 2023 revealed serum creatinine 1.21 with a eGFR normal.  Avoid nephrotoxic agents.  Continue current medication regimen. Continue to follow with PCP.  7. Pulmonary HTN Primarily pulmonary venous hypertension seen during right heart cath in February 2023.  Mild to moderately reduced systolic function of right ventricle etiology most likely due to new onset heart failure.  Pulmonary artery systolic pressure was moderately elevated.  Estimated RVSF 51.7 mmHg.  Denies any SHOB. Continue current GDMT.  If worsening shortness of breath in the future, consider referral to pulmonology.  Disposition: Follow-up with me in 3 to 4 weeks or sooner if anything changes.   Medication Adjustments/Labs and Tests Ordered: Current medicines are reviewed at length with the patient today.  Concerns  regarding  medicines are outlined above.  Orders Placed This Encounter  Procedures   Basic metabolic panel   Brain natriuretic peptide   No orders of the defined types were placed in this encounter.   Patient Instructions  Medication Instructions:  Continue all current medications.  Labwork: BNP, BMET - orders given today Office will contact with results via phone, letter or mychart.     Testing/Procedures: none  Follow-Up:  3 weeks   Any Other Special Instructions Will Be Listed Below (If Applicable). Please keep log of weights and bring to next visit.  If you need a refill on your cardiac medications before your next appointment, please call your pharmacy.    SignedFinis Bud, NP  10/12/2022 12:59 PM    [place]

## 2022-10-12 ENCOUNTER — Other Ambulatory Visit (HOSPITAL_COMMUNITY)
Admission: RE | Admit: 2022-10-12 | Discharge: 2022-10-12 | Disposition: A | Discharge status: Home / Self Care | Payer: Medicare Other | Source: Ambulatory Visit | Attending: Nurse Practitioner | Admitting: Nurse Practitioner

## 2022-10-12 ENCOUNTER — Ambulatory Visit: Payer: Medicare Other | Attending: Nurse Practitioner | Admitting: Nurse Practitioner

## 2022-10-12 ENCOUNTER — Encounter: Payer: Self-pay | Admitting: Nurse Practitioner

## 2022-10-12 VITALS — BP 142/98 | HR 83 | Ht 73.0 in | Wt 191.8 lb

## 2022-10-12 DIAGNOSIS — I739 Peripheral vascular disease, unspecified: Secondary | ICD-10-CM

## 2022-10-12 DIAGNOSIS — I2510 Atherosclerotic heart disease of native coronary artery without angina pectoris: Secondary | ICD-10-CM | POA: Diagnosis not present

## 2022-10-12 DIAGNOSIS — I428 Other cardiomyopathies: Secondary | ICD-10-CM | POA: Diagnosis not present

## 2022-10-12 DIAGNOSIS — Z79899 Other long term (current) drug therapy: Secondary | ICD-10-CM

## 2022-10-12 DIAGNOSIS — I10 Essential (primary) hypertension: Secondary | ICD-10-CM

## 2022-10-12 DIAGNOSIS — I5020 Unspecified systolic (congestive) heart failure: Secondary | ICD-10-CM | POA: Diagnosis not present

## 2022-10-12 DIAGNOSIS — N1832 Chronic kidney disease, stage 3b: Secondary | ICD-10-CM | POA: Insufficient documentation

## 2022-10-12 DIAGNOSIS — I4719 Other supraventricular tachycardia: Secondary | ICD-10-CM

## 2022-10-12 DIAGNOSIS — I5042 Chronic combined systolic (congestive) and diastolic (congestive) heart failure: Secondary | ICD-10-CM | POA: Insufficient documentation

## 2022-10-12 DIAGNOSIS — I272 Pulmonary hypertension, unspecified: Secondary | ICD-10-CM

## 2022-10-12 LAB — BASIC METABOLIC PANEL
Anion gap: 8 (ref 5–15)
BUN: 15 mg/dL (ref 6–20)
CO2: 26 mmol/L (ref 22–32)
Calcium: 8.8 mg/dL — ABNORMAL LOW (ref 8.9–10.3)
Chloride: 99 mmol/L (ref 98–111)
Creatinine, Ser: 1.41 mg/dL — ABNORMAL HIGH (ref 0.61–1.24)
GFR, Estimated: 58 mL/min — ABNORMAL LOW (ref >60)
Glucose, Bld: 102 mg/dL — ABNORMAL HIGH (ref 70–99)
Potassium: 4.1 mmol/L (ref 3.5–5.1)
Sodium: 133 mmol/L — ABNORMAL LOW (ref 135–145)

## 2022-10-12 LAB — BRAIN NATRIURETIC PEPTIDE: B Natriuretic Peptide: 2131 pg/mL — ABNORMAL HIGH (ref 0.0–100.0)

## 2022-10-12 NOTE — Patient Instructions (Addendum)
Medication Instructions:  Continue all current medications.  Labwork: BNP, BMET - orders given today Office will contact with results via phone, letter or mychart.     Testing/Procedures: none  Follow-Up:  3 weeks   Any Other Special Instructions Will Be Listed Below (If Applicable). Please keep log of weights and bring to next visit.  If you need a refill on your cardiac medications before your next appointment, please call your pharmacy.

## 2022-10-19 ENCOUNTER — Telehealth: Payer: Self-pay | Admitting: *Deleted

## 2022-10-19 DIAGNOSIS — I5042 Chronic combined systolic (congestive) and diastolic (congestive) heart failure: Secondary | ICD-10-CM

## 2022-10-19 DIAGNOSIS — I10 Essential (primary) hypertension: Secondary | ICD-10-CM

## 2022-10-19 DIAGNOSIS — Z79899 Other long term (current) drug therapy: Secondary | ICD-10-CM

## 2022-10-19 NOTE — Telephone Encounter (Signed)
Lab order entered for [facility] - he will go in 1 week.

## 2022-10-19 NOTE — Telephone Encounter (Signed)
Laurine Blazer, LPN 12/82/0813 88:71 AM EST Back to Top    Notified, copy to pcp.     Laurine Blazer, LPN 95/97/4718  5:50 PM EST     Left message to return call.   Finis Bud, NP 10/15/2022  8:58 PM EST     Let's uptitrate his Lasix as his BNP is up, showing he's holding onto fluid. Let's have him take Lasix 40 mg BID x 3 days, then reduce to 40 mg daily thereafter. Then, repeat a BMET in 1 week. Rest of his labs are overall stable.   Thanks!   Finis Bud, AGNP-C

## 2022-11-02 ENCOUNTER — Ambulatory Visit: Payer: Medicare Other | Attending: Nurse Practitioner | Admitting: Nurse Practitioner

## 2022-11-02 ENCOUNTER — Encounter: Payer: Self-pay | Admitting: Nurse Practitioner

## 2022-11-02 VITALS — BP 139/60 | HR 89 | Ht 73.0 in | Wt 169.8 lb

## 2022-11-02 DIAGNOSIS — Z79899 Other long term (current) drug therapy: Secondary | ICD-10-CM

## 2022-11-02 DIAGNOSIS — I5020 Unspecified systolic (congestive) heart failure: Secondary | ICD-10-CM | POA: Diagnosis not present

## 2022-11-02 DIAGNOSIS — I739 Peripheral vascular disease, unspecified: Secondary | ICD-10-CM

## 2022-11-02 DIAGNOSIS — I272 Pulmonary hypertension, unspecified: Secondary | ICD-10-CM | POA: Diagnosis not present

## 2022-11-02 DIAGNOSIS — I428 Other cardiomyopathies: Secondary | ICD-10-CM

## 2022-11-02 DIAGNOSIS — I10 Essential (primary) hypertension: Secondary | ICD-10-CM

## 2022-11-02 DIAGNOSIS — N1831 Chronic kidney disease, stage 3a: Secondary | ICD-10-CM

## 2022-11-02 DIAGNOSIS — I2510 Atherosclerotic heart disease of native coronary artery without angina pectoris: Secondary | ICD-10-CM | POA: Diagnosis not present

## 2022-11-02 DIAGNOSIS — I4719 Other supraventricular tachycardia: Secondary | ICD-10-CM | POA: Diagnosis not present

## 2022-11-02 MED ORDER — ENTRESTO 24-26 MG PO TABS: 1.0000 | ORAL_TABLET | Freq: Two times a day (BID) | ORAL | 6 refills | Status: DC

## 2022-11-02 MED ORDER — METOPROLOL SUCCINATE ER 50 MG PO TB24: 50.0000 mg | ORAL_TABLET | Freq: Every day | ORAL | 6 refills | Status: DC

## 2022-11-02 NOTE — Progress Notes (Signed)
Significant CHF. He will need ov [place] and cardiac clearance.

## 2022-11-02 NOTE — Progress Notes (Signed)
Cardiology Office Note:    Date:  11/02/2022   ID:  Edward Rocha., DOB [birth year], MRN [id number]  PCP:  Olga Coaster, [place] [facility] Cardiologist:  Rozann Lesches, MD     Referring MD: Olga Coaster, FNP   CC: Here for CHF follow-up  History of Present Illness:    Edward Rocha. is a 57 y.o. male with a hx of the following:  Mild CAD PAD, s/p left femoral to popliteal bypass and iliac stenting HTN Low platelets T2DM Hypothyroidism HFrEF, NICM   Patient is a 57 year old male with past medical history as mentioned above.  He presented to establish care with cardiology after hospital discharge on July 22, 2021 after he was found to be in acute combined congestive heart failure with newly diagnosed cardiomyopathy, EF 20 to 25% with diffuse hypokinesis of left ventricle.  He had mild elevation of his cardiac enzymes that was associated with heart failure.  Underwent cardiac catheterization that revealed mild nonobstructive CAD, was treated medically.  Hospital course was notable for renal insufficiency, acute on chronic, with CKD stage III at baseline.  Cardiac MRI did not suggest viral or infiltrative etiologies.   After hospital discharge, he was coming off diuretics as he was given only 1 week of Lasix and started to feel worse, became short of breath, and presented to [facility], ED and found to have bilateral pleural effusions.  Given a prescription for Lasix 20 mg daily.    Has been closely followed by Dr. Domenic Polite.  Last seen by Dr. Domenic Polite on August 14, 2022.  Overall he was doing well from a cardiac perspective and denied any shortness of breath or leg swelling.  Toprol-XL was increased to 25 mg daily, was started on low-dose Entresto with close follow-up of BMET.  Increased beta-blocker due to heart rate at 108 in office that day, previous history of ectopic atrial tachycardia.  Dr. Domenic Polite also stated may need to consider amiodarone in the  future.  I saw this patient for follow-up evaluation on 10/12/2022.  Denied any chest pain, shortness of breath, palpitations, syncope, presyncope, dizziness, orthopnea, PND, acute bleeding, or claudication. Was taking all of his medications except for [facility], had difficulty with trying to get [facility] from pharmacy. Was not checking his weight daily, admitted to some salty foods, but overall said he did not add any salt to his foods.  Systolic blood pressures are averaging around 130s. Repeat pro-BNP was elevated at 2,131. Did show signs of volume overload on exam. Weight was up. Increased Lasix 40 mg BID x 3 days, then reduced to Lasix 40 mg daily. Would repeat BMET in 1 week. Told to follow up in 3-4 weeks.   Today he presents for follow-up. He says he is doing well. Dyspnea on exertion is stable. Still has not started on Entresto but tolerating rest of his medications well. Has cut out salt from his diet and weight has been stable. Per our records, he is down over 20 pounds since last visit. Denies any CP, worsening SHOB, palpitations, orthpnea, PND, syncope, presyncope, dizziness, swelling, acute bleeding, or claudication. BP well controlled. Denies any other questions or concerns. He is the husband of Edward Rocha, another patient of mine.   Past Medical History:  Diagnosis Date   Acute on chronic renal failure (HCC)    Essential hypertension    GERD (gastroesophageal reflux disease)    Hyperosmolar syndrome 2020   Hypothyroidism  Mild coronary artery disease    Cardiac catheterization September 2022 - [facility]   Nonischemic cardiomyopathy [facility])    PAD (peripheral artery disease) (HCC)    Thrombocytopenia ([place])    Type 2 diabetes mellitus ([place])     Past Surgical History:  Procedure Laterality Date   ABDOMINAL AORTOGRAM W/LOWER EXTREMITY N/A 02/06/2019   Procedure: ABDOMINAL AORTOGRAM W/LOWER EXTREMITY;  Surgeon: [name], MD;  Location: [place] [facility];  Service:  Cardiovascular;  Laterality: N/A;  bilateral   ABDOMINAL AORTOGRAM W/LOWER EXTREMITY N/A 04/30/2019   Procedure: ABDOMINAL AORTOGRAM W/LOWER EXTREMITY;  Surgeon: [name], MD;  Location: [place] [facility];  Service: Cardiovascular;  Laterality: N/A;   AORTOGRAM  08/11/2019   Procedure: Aortogram;  Surgeon: [name], MD;  Location: [place];  Service: Vascular;;   ESOPHAGEAL BANDING  08/22/2019   Procedure: ESOPHAGEAL BANDING;  Surgeon: Milus Banister, MD;  Location: [facility];  Service: Endoscopy;;   ESOPHAGOGASTRODUODENOSCOPY N/A 08/22/2019   Procedure: ESOPHAGOGASTRODUODENOSCOPY (EGD);  Surgeon: Milus Banister, MD;  Location: [facility] ENDOSCOPY;  Service: Endoscopy;  Laterality: N/A;   FEMORAL-POPLITEAL BYPASS GRAFT Left 08/11/2019   Procedure: BYPASS LEFT FEMORAL-DISTAL POPLITEAL ARTERY USING PROPATEN GRAFT;  Surgeon: [name], MD;  Location: [place];  Service: Vascular;  Laterality: Left;   INSERTION OF DIALYSIS CATHETER Right 09/09/2019   Procedure: INSERTION OF DIALYSIS CATHETER;  Surgeon: Waynetta Sandy, MD;  Location: [place];  Service: Vascular;  Laterality: Right;   MULTIPLE TOOTH EXTRACTIONS     PERIPHERAL VASCULAR INTERVENTION  02/06/2019   Procedure: PERIPHERAL VASCULAR INTERVENTION;  Surgeon: [name], MD;  Location: [place] [facility];  Service: Cardiovascular;;  Bilateral Iliacs   PERIPHERAL VASCULAR INTERVENTION  04/30/2019   Procedure: PERIPHERAL VASCULAR INTERVENTION;  Surgeon: [name], MD;  Location: [place] [facility];  Service: Cardiovascular;;  Stent - Lt. Iliac    REMOVAL OF A DIALYSIS CATHETER Right 09/09/2019   Procedure: Removal Of A Dialysis Catheter;  Surgeon: Waynetta Sandy, MD;  Location: [place];  Service: Vascular;  Laterality: Right;   RIGHT HEART CATH N/A 12/12/2021   Procedure: RIGHT HEART CATH;  Surgeon: Larey Dresser, MD;  Location: [place] [facility];  Service: Cardiovascular;   Laterality: N/A;   ULTRASOUND GUIDANCE FOR VASCULAR ACCESS Right 09/09/2019   Procedure: Ultrasound Guidance For Vascular Access;  Surgeon: Waynetta Sandy, MD;  Location: [place];  Service: Vascular;  Laterality: Right;   VASCULAR SURGERY      Current Medications: Current Meds  Medication Sig   acetaminophen (TYLENOL) 500 MG tablet Take 1 tablet (500 mg total) by mouth daily as needed for moderate pain or headache.   aspirin EC 81 MG tablet Take 81 mg by mouth daily. Swallow whole.   atorvastatin (LIPITOR) 40 MG tablet Take 1 tablet (40 mg total) by mouth daily.   dapagliflozin propanediol (FARXIGA) 10 MG TABS tablet Take 1 tablet (10 mg total) by mouth daily.   Elastic Bandages & Supports (KNEE SUPPORT/ELASTIC/FIRM MED) MISC 1 each by Does not apply route as directed. Medium pressure compression stockings Dx: leg edema   ergocalciferol (VITAMIN D2) 1.25 MG (50000 UT) capsule Take 50,000 Units by mouth once a week.   furosemide (LASIX) 40 MG tablet Take 1 tablet (40 mg total) by mouth daily.   nitroGLYCERIN (NITROSTAT) 0.4 MG SL tablet Place 0.4 mg under the tongue every 5 (five) minutes as needed for chest pain.   pregabalin (  LYRICA) 25 MG capsule Take 25 mg by mouth 3 (three) times daily.   spironolactone (ALDACTONE) 25 MG tablet Take 0.5 tablets (12.5 mg total) by mouth daily.   metoprolol succinate (TOPROL XL) 25 MG 24 hr tablet Take 1 tablet (25 mg total) by mouth daily.     Allergies:   Other   Social History   Socioeconomic History   Marital status: Married    Spouse name: Not on file   Number of children: 2   Years of education: Not on file   Highest education level: Not on file  Occupational History   Not on file  Tobacco Use   Smoking status: Former    Packs/day: 1.00    Years: 36.00    Total pack years: 36.00    Types: Cigarettes    Quit date: 01/25/2019    Years since quitting: 3.7    Passive exposure: Never   Smokeless tobacco: Never  Vaping Use    Vaping Use: Never used  Substance and Sexual Activity   Alcohol use: Not Currently    Comment: per pt's wife, pt is an alcoholic but just recently stopped drinking in 04/2019   Drug use: Never   Sexual activity: Not on file  Other Topics Concern   Not on file  Social History Narrative   Not on file   Social Determinants of Health   Financial Resource Strain: Low Risk  (09/21/2019)   Overall Financial Resource Strain (CARDIA)    Difficulty of Paying Living Expenses: Not very hard  Food Insecurity: No Food Insecurity (09/21/2019)   Hunger Vital Sign    Worried About Running Out of Food in the Last Year: Never true    [place] in the Last Year: Never true  Transportation Needs: No Transportation Needs (09/21/2019)   PRAPARE - Hydrologist (Medical): No    Lack of Transportation (Non-Medical): No  Physical Activity: Insufficiently Active (09/21/2019)   Exercise Vital Sign    Days of Exercise per Week: 7 days    Minutes of Exercise per Session: 20 min  Stress: No Stress Concern Present (09/21/2019)   [place]    Feeling of Stress : Only a little  Social Connections: Moderately Isolated (09/21/2019)   Social Connection and Isolation Panel [NHANES]    Frequency of Communication with Friends and Family: More than three times a week    Frequency of Social Gatherings with Friends and Family: Three times a week    Attends Religious Services: Never    Active Member of Clubs or Organizations: No    Attends Archivist Meetings: Never    Marital Status: Married     Family History: The patient's family history includes Alcohol abuse in his brother and father; Bipolar disorder in his daughter and son; Cancer in his mother.  ROS:   Review of Systems  Constitutional: Negative.   HENT: Negative.    Eyes: Negative.   Respiratory:  Positive for shortness of breath. Negative for  cough, hemoptysis, sputum production and wheezing.        See HPI.   Cardiovascular: Negative.   Gastrointestinal: Negative.   Genitourinary: Negative.   Musculoskeletal: Negative.   Skin: Negative.   Neurological: Negative.   Endo/Heme/Allergies: Negative.   Psychiatric/Behavioral: Negative.       Please see the history of present illness.    All other systems reviewed and are negative.  EKGs/Labs/Other  Studies Reviewed:    The following studies were reviewed today:   EKG:  EKG is not ordered today.    Cardiac MRI on December 16, 2021: IMPRESSION: 1.  Moderately dilated LV with EF 23%, diffuse hypokinesis.   2.  Mildly dilated RV with EF 31%.   3. Diffuse delayed enhancement imaging. Suspect only nonspecific inferior RV insertion site LGE.   4. Mildly elevated extracellular volume percentage in the septum, nonspecific.  Right heart cath on December 12, 2021: 1. Severely elevated right and left heart filling pressures.  2. Low cardiac output by thermodilution, normal by Fick.  3. Primarily pulmonary venous hypertension.    Needs extensive diuresis, will start milrinone with Lasix gtt ongoing.   2D echocardiogram on December 12, 2021: 1. Left ventricular ejection fraction, by estimation, is 20 to 25%. The  left ventricle has severely decreased function. The left ventricle  demonstrates global hypokinesis. There is moderate left ventricular  hypertrophy. Left ventricular diastolic  parameters are consistent with Grade II diastolic dysfunction  (pseudonormalization). Elevated left atrial pressure.   2. Right ventricular systolic function is mild to moderately reduced. The  right ventricular size is moderately enlarged. There is moderately  elevated pulmonary artery systolic pressure. The estimated right  ventricular systolic pressure is 16.1 mmHg.   3. Left atrial size was severely dilated.   4. Right atrial size was severely dilated.   5. A small pericardial  effusion is present.   6. The mitral valve is normal in structure. Trivial mitral valve  regurgitation.   7. Tricuspid valve regurgitation is moderate.   8. The aortic valve is tricuspid. Aortic valve regurgitation is not  visualized. Aortic valve sclerosis/calcification is present, without any  evidence of aortic stenosis.   9. The inferior vena cava is dilated in size with <50% respiratory  variability, suggesting right atrial pressure of 15 mmHg.  Vascular ultrasound lower Doppler bilateral on October 19, 2019: IMPRESSION: 1. No evidence of deep venous thrombosis in either lower extremity. 2. In the left inguinal region there is a large mildly complex cystic fluid collection measuring 6.4 x 5.3 x 3.6 cm. Differential considerations are broad. If the patient has had prior surgery in this region, this could represent a seroma, lymphocele or the liquified remnants of a prior hematoma. Alternately, this could represent centrally necrotic lymphadenopathy if the patient has a known history of squamous cell carcinoma.   Recent Labs: 12/12/2021: TSH 6.178 12/13/2021: ALT 13 12/16/2021: Magnesium 2.1 12/19/2021: Hemoglobin 11.6; Platelets 115 10/12/2022: B Natriuretic Peptide 2,131.0; BUN 15; Creatinine, Ser 1.41; Potassium 4.1; Sodium 133  Recent Lipid Panel No results found for: "CHOL", "TRIG", "HDL", "CHOLHDL", "VLDL", "LDLCALC", "LDLDIRECT"  Physical Exam:    VS:  BP 139/60 (BP Location: Left Arm, Patient Position: Sitting, Cuff Size: Normal)   Pulse 89   Ht _0  (1.854 m)   Wt 169 lb 12.8 oz (77 kg)   SpO2 97%   BMI 22.40 kg/m     Wt Readings from Last 3 Encounters:  11/02/22 169 lb 12.8 oz (77 kg)  10/12/22 191 lb 12.8 oz (87 kg)  09/28/22 183 lb (83 kg)     GEN: Well nourished, well developed 57 y.o. male in no acute distress HEENT: Normal NECK: No JVD ; No carotid bruits CARDIAC: S1/S2, RRR, no murmurs, rubs, gallops; 2+ peripheral pulses throughout, strong and equal  bilaterally RESPIRATORY:  Clear and diminished to auscultation without rales, wheezing or rhonchi  MUSCULOSKELETAL: no edema along BLE; No  deformity  SKIN: Warm and dry NEUROLOGIC:  Alert and oriented x 3 PSYCHIATRIC:  Normal affect   ASSESSMENT:    1. HFrEF (heart failure with reduced ejection fraction) ([place])   2. NICM (nonischemic cardiomyopathy) ([place])   3. Medication management   4. Mild coronary artery disease   5. Ectopic atrial tachycardia   6. PAD (peripheral artery disease) ([place])   7. Hypertension, unspecified type   8. Stage 3a chronic kidney disease (CKD) ([place])   9. Pulmonary HTN (HCC)     PLAN:    In order of problems listed above:  Heart failure with reduced ejection fraction, NICM, medication management TTE in 11/2021 revealed EF 20 to 25%, global hypokinesis of left ventricle, grade 2 DD, moderate LVH. Right heart cath revealed severely elevated right and left heart filling pressures with primarily pulmonary venous hypertension.  Cardiac MRI revealed EF around 23% did not reveal sarcoidosis or amyloidosis.  No viral etiology seen on cardiac MRI. BLE swelling has resolved, no signs of JVD, normal work of breathing on exam. Has lost over 20 lbs since last time I saw him. Will increase Metoprolol as mentioned below. Continue Farxiga, Aldactone, and Toprol-XL. Will check on getting Entresto started for patient - have asked my LPN Orson Slick for assistance with this. Low sodium diet, fluid restriction <2L, and daily weights encouraged. Educated to contact our office for weight gain of 2 lbs overnight or 5 lbs in one week. Heart healthy diet and regular cardiovascular exercise encouraged.  ED precautions discussed. Will obtain BMET as previously ordered and arrange pro-BNP to be obtained. At next office visit, plan to discuss/consider HF clinic referral.   Mild CAD Stable with no anginal symptoms. No indication for ischemic evaluation.  Continue aspirin, Lipitor and nitroglycerin  as needed. Increasing Metoprolol as mentioned below. Heart healthy diet and regular cardiovascular exercise encouraged.   History of ectopic atrial tachycardia Denies any tachycardia or palpitations.  Heart rate is high normal, and slightly tachycardic on exam. Increase Metoprolol to 50 mg daily, BP 140/88 today. Continue current medication regimen.Heart healthy diet and regular cardiovascular exercise encouraged.   PAD, s/p left femoral to popliteal bypass and iliac stenting Denies any claudication symptoms. Leg edema has resolved. Continue current medication regimen. Heart healthy diet and regular cardiovascular exercise encouraged.   Hypertension Blood pressure elevated today at 140/88, repeat BP 139/60.  Goal SBP < 130. Compliant with his medications. Will increase Metoprolol succinate to 50 mg daily. Discussed to monitor BP at home at least 2 hours after medications and sitting for 5-10 minutes.  Continue Aldactone. Will try to start [facility] as mentioned above.[place] Heart healthy diet and regular cardiovascular exercise encouraged.  Will obtain BMET as mentioned above.  CKD stage IIIa He still has a pending BMET from last office visit.  Will obtain BMET at this time.  Last Kidney function revealed serum creatinine 1.41 with a eGFR at 58.  Avoid nephrotoxic agents.  Continue current medication regimen. Continue to follow with PCP.  7. Pulmonary HTN Primarily pulmonary venous hypertension seen during right heart cath in February 2023.  Mild to moderately reduced systolic function of right ventricle etiology most likely due to new onset heart failure.  Pulmonary artery systolic pressure was moderately elevated.  Estimated RVSF 51.7 mmHg.  Notes stable DOE. Increasing Toprol XL and will try to restart Entresto as mentioned above. Will refer to pulmonology at this time.    Disposition: Follow-up with me in 8 weeks or sooner if anything  changes.   Medication Adjustments/Labs and Tests  Ordered: Current medicines are reviewed at length with the patient today.  Concerns regarding medicines are outlined above.  Orders Placed This Encounter  Procedures   Basic metabolic panel   Brain natriuretic peptide   Ambulatory referral to Pulmonology   Meds ordered this encounter  Medications   sacubitril-valsartan (ENTRESTO) 24-26 MG    Sig: Take 1 tablet by mouth 2 (two) times daily.    Dispense:  60 tablet    Refill:  6    New 11/02/2022   metoprolol succinate (TOPROL XL) 50 MG 24 hr tablet    Sig: Take 1 tablet (50 mg total) by mouth daily.    Dispense:  30 tablet    Refill:  6    Dose increased 11/02/2022    Patient Instructions  Medication Instructions:  Begin Entersto 24/69m twice a day - will try for coverage this time Increase Toprol XL to 562mdaily  Continue all other medications.     Labwork: BMET, BNP - orders given today Please do today if possible  Office will contact with results via phone, letter or mychart.     Testing/Procedures: none  Follow-Up: 8 weeks   Any Other Special Instructions Will Be Listed Below (If Applicable). Referral to Pulmonary   If you need a refill on your cardiac medications before your next appointment, please call your pharmacy.       Signed, Finis BudNP  11/02/2022 3:04 PM    [place]

## 2022-11-02 NOTE — Patient Instructions (Addendum)
Medication Instructions:  Begin Entersto 24/26mg  twice a day - will try for coverage this time Increase Toprol XL to 50mg  daily  Continue all other medications.     Labwork: BMET, BNP - orders given today Please do today if possible  Office will contact with results via phone, letter or mychart.     Testing/Procedures: none  Follow-Up: 8 weeks   Any Other Special Instructions Will Be Listed Below (If Applicable). Referral to Pulmonary   If you need a refill on your cardiac medications before your next appointment, please call your pharmacy.

## 2022-11-03 ENCOUNTER — Encounter: Payer: Self-pay | Admitting: Gastroenterology

## 2022-11-03 ENCOUNTER — Telehealth: Payer: Self-pay | Admitting: *Deleted

## 2022-11-03 NOTE — Telephone Encounter (Signed)
error 

## 2022-11-06 DIAGNOSIS — R000 Tachycardia, unspecified: Secondary | ICD-10-CM | POA: Diagnosis not present

## 2022-11-06 DIAGNOSIS — S91301A Unspecified open wound, right foot, initial encounter: Secondary | ICD-10-CM | POA: Diagnosis not present

## 2022-12-11 ENCOUNTER — Ambulatory Visit: Payer: Medicare Other | Admitting: Gastroenterology

## 2022-12-12 ENCOUNTER — Ambulatory Visit: Payer: Medicare Other | Admitting: Gastroenterology

## 2022-12-14 ENCOUNTER — Institutional Professional Consult (permissible substitution): Payer: Medicare Other | Admitting: Internal Medicine

## 2022-12-14 NOTE — Progress Notes (Incomplete)
   Edward Rocha., male    DOB: [birth year]    MRN: [id number]   Brief patient profile:  ***  yo*** *** referred to pulmonary clinic in [place]  12/14/2022 by *** for ***      History of Present Illness  12/14/2022  Pulmonary/ 1st office eval/ Melvyn Novas / [place] [facility]  No chief complaint on file.    Dyspnea:  *** Cough: *** Sleep: *** SABA use: *** 02: *** Lung cancer screen: ***  Past Medical History:  Diagnosis Date   Acute on chronic renal failure (HCC)    Essential hypertension    GERD (gastroesophageal reflux disease)    Hyperosmolar syndrome 2020   Hypothyroidism    Mild coronary artery disease    Cardiac catheterization September 2022 - [facility]   Nonischemic cardiomyopathy [facility])    PAD (peripheral artery disease) (HCC)    Thrombocytopenia ([place])    Type 2 diabetes mellitus ([place])     Outpatient Medications Prior to Visit  Medication Sig Dispense Refill   acetaminophen (TYLENOL) 500 MG tablet Take 1 tablet (500 mg total) by mouth daily as needed for moderate pain or headache. 30 tablet 0   aspirin EC 81 MG tablet Take 81 mg by mouth daily. Swallow whole.     atorvastatin (LIPITOR) 40 MG tablet Take 1 tablet (40 mg total) by mouth daily. 90 tablet 1   dapagliflozin propanediol (FARXIGA) 10 MG TABS tablet Take 1 tablet (10 mg total) by mouth daily. 90 tablet 1   Elastic Bandages & Supports (KNEE SUPPORT/ELASTIC/FIRM MED) MISC 1 each by Does not apply route as directed. Medium pressure compression stockings Dx: leg edema 1 each 0   ergocalciferol (VITAMIN D2) 1.25 MG (50000 UT) capsule Take 50,000 Units by mouth once a week.     furosemide (LASIX) 40 MG tablet Take 1 tablet (40 mg total) by mouth daily. 90 tablet 1   metoprolol succinate (TOPROL XL) 50 MG 24 hr tablet Take 1 tablet (50 mg total) by mouth daily. 30 tablet 6   nitroGLYCERIN (NITROSTAT) 0.4 MG SL tablet Place 0.4 mg under the tongue every 5 (five) minutes as needed for chest pain.     pregabalin  (LYRICA) 25 MG capsule Take 25 mg by mouth 3 (three) times daily.     sacubitril-valsartan (ENTRESTO) 24-26 MG Take 1 tablet by mouth 2 (two) times daily. 60 tablet 6   spironolactone (ALDACTONE) 25 MG tablet Take 0.5 tablets (12.5 mg total) by mouth daily. 45 tablet 1   No facility-administered medications prior to visit.     Objective:     There were no vitals taken for this visit.         Assessment   No problem-specific Assessment & Plan notes found for this encounter.     Christinia Gully, MD 12/14/2022

## 2022-12-18 ENCOUNTER — Encounter: Payer: Self-pay | Admitting: Gastroenterology

## 2022-12-28 ENCOUNTER — Ambulatory Visit: Payer: Medicare Other | Admitting: Nurse Practitioner

## 2022-12-28 ENCOUNTER — Encounter: Payer: Self-pay | Admitting: Nurse Practitioner

## 2022-12-28 DIAGNOSIS — I509 Heart failure, unspecified: Secondary | ICD-10-CM | POA: Diagnosis not present

## 2022-12-28 DIAGNOSIS — E785 Hyperlipidemia, unspecified: Secondary | ICD-10-CM | POA: Diagnosis not present

## 2022-12-28 DIAGNOSIS — I10 Essential (primary) hypertension: Secondary | ICD-10-CM | POA: Diagnosis not present

## 2022-12-28 DIAGNOSIS — R0989 Other specified symptoms and signs involving the circulatory and respiratory systems: Secondary | ICD-10-CM | POA: Diagnosis not present

## 2022-12-28 DIAGNOSIS — Z7182 Exercise counseling: Secondary | ICD-10-CM | POA: Diagnosis not present

## 2022-12-28 DIAGNOSIS — E559 Vitamin D deficiency, unspecified: Secondary | ICD-10-CM | POA: Diagnosis not present

## 2022-12-28 DIAGNOSIS — Z713 Dietary counseling and surveillance: Secondary | ICD-10-CM | POA: Diagnosis not present

## 2022-12-28 DIAGNOSIS — E1140 Type 2 diabetes mellitus with diabetic neuropathy, unspecified: Secondary | ICD-10-CM | POA: Diagnosis not present

## 2022-12-28 DIAGNOSIS — Z0000 Encounter for general adult medical examination without abnormal findings: Secondary | ICD-10-CM | POA: Diagnosis not present

## 2022-12-28 DIAGNOSIS — E119 Type 2 diabetes mellitus without complications: Secondary | ICD-10-CM | POA: Diagnosis not present

## 2023-01-26 ENCOUNTER — Encounter: Payer: Self-pay | Admitting: Nurse Practitioner

## 2023-02-27 ENCOUNTER — Other Ambulatory Visit: Payer: Self-pay

## 2023-02-27 ENCOUNTER — Emergency Department (HOSPITAL_BASED_OUTPATIENT_CLINIC_OR_DEPARTMENT_OTHER): Payer: Medicare Other

## 2023-02-27 ENCOUNTER — Inpatient Hospital Stay (HOSPITAL_COMMUNITY)
Admission: EM | Admit: 2023-02-27 | Discharge: 2023-03-12 | DRG: 253 | Disposition: A | Discharge status: Home Health | Payer: Medicare Other | Attending: Infectious Diseases | Admitting: Infectious Diseases

## 2023-02-27 ENCOUNTER — Encounter (HOSPITAL_COMMUNITY): Payer: Self-pay | Admitting: Emergency Medicine

## 2023-02-27 ENCOUNTER — Emergency Department (HOSPITAL_COMMUNITY): Payer: Medicare Other

## 2023-02-27 DIAGNOSIS — B952 Enterococcus as the cause of diseases classified elsewhere: Secondary | ICD-10-CM | POA: Diagnosis present

## 2023-02-27 DIAGNOSIS — E785 Hyperlipidemia, unspecified: Secondary | ICD-10-CM | POA: Diagnosis present

## 2023-02-27 DIAGNOSIS — R011 Cardiac murmur, unspecified: Secondary | ICD-10-CM | POA: Diagnosis present

## 2023-02-27 DIAGNOSIS — I428 Other cardiomyopathies: Secondary | ICD-10-CM | POA: Diagnosis not present

## 2023-02-27 DIAGNOSIS — R160 Hepatomegaly, not elsewhere classified: Secondary | ICD-10-CM | POA: Diagnosis present

## 2023-02-27 DIAGNOSIS — I739 Peripheral vascular disease, unspecified: Secondary | ICD-10-CM | POA: Diagnosis not present

## 2023-02-27 DIAGNOSIS — I110 Hypertensive heart disease with heart failure: Secondary | ICD-10-CM | POA: Diagnosis not present

## 2023-02-27 DIAGNOSIS — I272 Pulmonary hypertension, unspecified: Secondary | ICD-10-CM | POA: Diagnosis not present

## 2023-02-27 DIAGNOSIS — T82858A Stenosis of vascular prosthetic devices, implants and grafts, initial encounter: Secondary | ICD-10-CM | POA: Diagnosis not present

## 2023-02-27 DIAGNOSIS — I5022 Chronic systolic (congestive) heart failure: Secondary | ICD-10-CM | POA: Diagnosis present

## 2023-02-27 DIAGNOSIS — R000 Tachycardia, unspecified: Secondary | ICD-10-CM | POA: Diagnosis not present

## 2023-02-27 DIAGNOSIS — Z634 Disappearance and death of family member: Secondary | ICD-10-CM

## 2023-02-27 DIAGNOSIS — E1159 Type 2 diabetes mellitus with other circulatory complications: Secondary | ICD-10-CM | POA: Diagnosis present

## 2023-02-27 DIAGNOSIS — M869 Osteomyelitis, unspecified: Secondary | ICD-10-CM | POA: Diagnosis not present

## 2023-02-27 DIAGNOSIS — E871 Hypo-osmolality and hyponatremia: Secondary | ICD-10-CM | POA: Diagnosis present

## 2023-02-27 DIAGNOSIS — I70262 Atherosclerosis of native arteries of extremities with gangrene, left leg: Secondary | ICD-10-CM | POA: Diagnosis not present

## 2023-02-27 DIAGNOSIS — M79605 Pain in left leg: Secondary | ICD-10-CM

## 2023-02-27 DIAGNOSIS — B964 Proteus (mirabilis) (morganii) as the cause of diseases classified elsewhere: Secondary | ICD-10-CM | POA: Diagnosis not present

## 2023-02-27 DIAGNOSIS — K7460 Unspecified cirrhosis of liver: Secondary | ICD-10-CM | POA: Diagnosis present

## 2023-02-27 DIAGNOSIS — R946 Abnormal results of thyroid function studies: Secondary | ICD-10-CM | POA: Diagnosis present

## 2023-02-27 DIAGNOSIS — M86172 Other acute osteomyelitis, left ankle and foot: Secondary | ICD-10-CM

## 2023-02-27 DIAGNOSIS — Z7984 Long term (current) use of oral hypoglycemic drugs: Secondary | ICD-10-CM | POA: Diagnosis not present

## 2023-02-27 DIAGNOSIS — E11621 Type 2 diabetes mellitus with foot ulcer: Secondary | ICD-10-CM | POA: Diagnosis not present

## 2023-02-27 DIAGNOSIS — E039 Hypothyroidism, unspecified: Secondary | ICD-10-CM | POA: Diagnosis not present

## 2023-02-27 DIAGNOSIS — I96 Gangrene, not elsewhere classified: Secondary | ICD-10-CM | POA: Diagnosis not present

## 2023-02-27 DIAGNOSIS — L97429 Non-pressure chronic ulcer of left heel and midfoot with unspecified severity: Secondary | ICD-10-CM | POA: Diagnosis not present

## 2023-02-27 DIAGNOSIS — Z79899 Other long term (current) drug therapy: Secondary | ICD-10-CM | POA: Diagnosis not present

## 2023-02-27 DIAGNOSIS — E1169 Type 2 diabetes mellitus with other specified complication: Secondary | ICD-10-CM | POA: Diagnosis present

## 2023-02-27 DIAGNOSIS — E1140 Type 2 diabetes mellitus with diabetic neuropathy, unspecified: Secondary | ICD-10-CM | POA: Diagnosis not present

## 2023-02-27 DIAGNOSIS — I509 Heart failure, unspecified: Secondary | ICD-10-CM | POA: Diagnosis not present

## 2023-02-27 DIAGNOSIS — I8510 Secondary esophageal varices without bleeding: Secondary | ICD-10-CM | POA: Diagnosis not present

## 2023-02-27 DIAGNOSIS — Z87891 Personal history of nicotine dependence: Secondary | ICD-10-CM

## 2023-02-27 DIAGNOSIS — Z8619 Personal history of other infectious and parasitic diseases: Secondary | ICD-10-CM

## 2023-02-27 DIAGNOSIS — R918 Other nonspecific abnormal finding of lung field: Secondary | ICD-10-CM | POA: Diagnosis not present

## 2023-02-27 DIAGNOSIS — I5020 Unspecified systolic (congestive) heart failure: Secondary | ICD-10-CM | POA: Diagnosis not present

## 2023-02-27 DIAGNOSIS — I70229 Atherosclerosis of native arteries of extremities with rest pain, unspecified extremity: Secondary | ICD-10-CM | POA: Diagnosis not present

## 2023-02-27 DIAGNOSIS — N179 Acute kidney failure, unspecified: Secondary | ICD-10-CM | POA: Diagnosis present

## 2023-02-27 DIAGNOSIS — N1831 Chronic kidney disease, stage 3a: Secondary | ICD-10-CM | POA: Diagnosis present

## 2023-02-27 DIAGNOSIS — E1152 Type 2 diabetes mellitus with diabetic peripheral angiopathy with gangrene: Secondary | ICD-10-CM | POA: Diagnosis not present

## 2023-02-27 DIAGNOSIS — S91302A Unspecified open wound, left foot, initial encounter: Secondary | ICD-10-CM | POA: Diagnosis present

## 2023-02-27 DIAGNOSIS — T508X5A Adverse effect of diagnostic agents, initial encounter: Secondary | ICD-10-CM | POA: Diagnosis not present

## 2023-02-27 DIAGNOSIS — G479 Sleep disorder, unspecified: Secondary | ICD-10-CM | POA: Diagnosis present

## 2023-02-27 DIAGNOSIS — M79672 Pain in left foot: Secondary | ICD-10-CM | POA: Diagnosis not present

## 2023-02-27 DIAGNOSIS — R0989 Other specified symptoms and signs involving the circulatory and respiratory systems: Secondary | ICD-10-CM | POA: Diagnosis present

## 2023-02-27 DIAGNOSIS — M7989 Other specified soft tissue disorders: Secondary | ICD-10-CM | POA: Diagnosis not present

## 2023-02-27 DIAGNOSIS — T82898A Other specified complication of vascular prosthetic devices, implants and grafts, initial encounter: Secondary | ICD-10-CM | POA: Diagnosis not present

## 2023-02-27 DIAGNOSIS — Z7982 Long term (current) use of aspirin: Secondary | ICD-10-CM

## 2023-02-27 DIAGNOSIS — R600 Localized edema: Secondary | ICD-10-CM

## 2023-02-27 DIAGNOSIS — I130 Hypertensive heart and chronic kidney disease with heart failure and stage 1 through stage 4 chronic kidney disease, or unspecified chronic kidney disease: Secondary | ICD-10-CM | POA: Diagnosis not present

## 2023-02-27 DIAGNOSIS — I2510 Atherosclerotic heart disease of native coronary artery without angina pectoris: Secondary | ICD-10-CM | POA: Diagnosis present

## 2023-02-27 DIAGNOSIS — R609 Edema, unspecified: Secondary | ICD-10-CM

## 2023-02-27 DIAGNOSIS — I5030 Unspecified diastolic (congestive) heart failure: Secondary | ICD-10-CM | POA: Diagnosis not present

## 2023-02-27 DIAGNOSIS — I70244 Atherosclerosis of native arteries of left leg with ulceration of heel and midfoot: Secondary | ICD-10-CM | POA: Diagnosis not present

## 2023-02-27 DIAGNOSIS — L97428 Non-pressure chronic ulcer of left heel and midfoot with other specified severity: Secondary | ICD-10-CM | POA: Diagnosis not present

## 2023-02-27 DIAGNOSIS — I7090 Unspecified atherosclerosis: Secondary | ICD-10-CM | POA: Diagnosis not present

## 2023-02-27 DIAGNOSIS — E1122 Type 2 diabetes mellitus with diabetic chronic kidney disease: Secondary | ICD-10-CM | POA: Diagnosis present

## 2023-02-27 DIAGNOSIS — K219 Gastro-esophageal reflux disease without esophagitis: Secondary | ICD-10-CM | POA: Diagnosis present

## 2023-02-27 DIAGNOSIS — I70245 Atherosclerosis of native arteries of left leg with ulceration of other part of foot: Secondary | ICD-10-CM | POA: Diagnosis not present

## 2023-02-27 DIAGNOSIS — Z794 Long term (current) use of insulin: Secondary | ICD-10-CM | POA: Diagnosis not present

## 2023-02-27 DIAGNOSIS — N1411 Contrast-induced nephropathy: Secondary | ICD-10-CM | POA: Diagnosis not present

## 2023-02-27 LAB — URINALYSIS, ROUTINE W REFLEX MICROSCOPIC
Bacteria, UA: NONE SEEN
Bilirubin Urine: NEGATIVE
Glucose, UA: NEGATIVE mg/dL
Ketones, ur: NEGATIVE mg/dL
Leukocytes,Ua: NEGATIVE
Nitrite: NEGATIVE
Protein, ur: 30 mg/dL — AB
Specific Gravity, Urine: 1.011 (ref 1.005–1.030)
pH: 5 (ref 5.0–8.0)

## 2023-02-27 LAB — LACTIC ACID, PLASMA
Lactic Acid, Venous: 1.8 mmol/L (ref 0.5–1.9)
Lactic Acid, Venous: 1.6 mmol/L (ref 0.5–1.9)

## 2023-02-27 LAB — CBC WITH DIFFERENTIAL/PLATELET
Abs Immature Granulocytes: 0.03 10*3/uL (ref 0.00–0.07)
Basophils Absolute: 0 10*3/uL (ref 0.0–0.1)
Basophils Relative: 1 %
Eosinophils Absolute: 0.1 10*3/uL (ref 0.0–0.5)
Eosinophils Relative: 1 %
HCT: 40.5 % (ref 39.0–52.0)
Hemoglobin: 13.2 g/dL (ref 13.0–17.0)
Immature Granulocytes: 1 %
Lymphocytes Relative: 18 %
Lymphs Abs: 1.1 10*3/uL (ref 0.7–4.0)
MCH: 28.1 pg (ref 26.0–34.0)
MCHC: 32.6 g/dL (ref 30.0–36.0)
MCV: 86.2 fL (ref 80.0–100.0)
Monocytes Absolute: 0.6 10*3/uL (ref 0.1–1.0)
Monocytes Relative: 9 %
Neutro Abs: 4.2 10*3/uL (ref 1.7–7.7)
Neutrophils Relative %: 70 %
Platelets: 147 10*3/uL — ABNORMAL LOW (ref 150–400)
RBC: 4.7 MIL/uL (ref 4.22–5.81)
RDW: 15.2 % (ref 11.5–15.5)
WBC: 6 10*3/uL (ref 4.0–10.5)
nRBC: 0 % (ref 0.0–0.2)

## 2023-02-27 LAB — COMPREHENSIVE METABOLIC PANEL
ALT: 13 U/L (ref 0–44)
AST: 25 U/L (ref 15–41)
Albumin: 3.1 g/dL — ABNORMAL LOW (ref 3.5–5.0)
Alkaline Phosphatase: 61 U/L (ref 38–126)
Anion gap: 10 (ref 5–15)
BUN: 24 mg/dL — ABNORMAL HIGH (ref 6–20)
CO2: 22 mmol/L (ref 22–32)
Calcium: 9.1 mg/dL (ref 8.9–10.3)
Chloride: 101 mmol/L (ref 98–111)
Creatinine, Ser: 1.36 mg/dL — ABNORMAL HIGH (ref 0.61–1.24)
GFR, Estimated: >60 mL/min (ref >60)
Glucose, Bld: 102 mg/dL — ABNORMAL HIGH (ref 70–99)
Potassium: 4.6 mmol/L (ref 3.5–5.1)
Sodium: 133 mmol/L — ABNORMAL LOW (ref 135–145)
Total Bilirubin: 1.4 mg/dL — ABNORMAL HIGH (ref 0.3–1.2)
Total Protein: 8.6 g/dL — ABNORMAL HIGH (ref 6.5–8.1)

## 2023-02-27 LAB — HEMOGLOBIN A1C
Hgb A1c MFr Bld: 6.5 % — ABNORMAL HIGH (ref 4.8–5.6)
Mean Plasma Glucose: 139.85 mg/dL

## 2023-02-27 MED ORDER — ATORVASTATIN CALCIUM 40 MG PO TABS
40.0000 mg | ORAL_TABLET | Freq: Every day | ORAL | Status: DC
  Administered 2023-02-27 – 2023-03-04 (×6): 40 mg via ORAL
  Filled 2023-02-27 (×6): qty 1

## 2023-02-27 MED ORDER — ACETAMINOPHEN 325 MG PO TABS
650.0000 mg | ORAL_TABLET | Freq: Four times a day (QID) | ORAL | Status: DC | PRN
  Administered 2023-02-27: 650 mg via ORAL
  Filled 2023-02-27: qty 2

## 2023-02-27 MED ORDER — DAPAGLIFLOZIN PROPANEDIOL 10 MG PO TABS
10.0000 mg | ORAL_TABLET | Freq: Every day | ORAL | Status: DC
  Administered 2023-02-27 – 2023-03-11 (×13): 10 mg via ORAL
  Filled 2023-02-27 (×13): qty 1

## 2023-02-27 MED ORDER — ENOXAPARIN SODIUM 40 MG/0.4ML IJ SOSY
40.0000 mg | PREFILLED_SYRINGE | INTRAMUSCULAR | Status: DC
  Administered 2023-02-27: 40 mg via SUBCUTANEOUS
  Filled 2023-02-27: qty 0.4

## 2023-02-27 MED ORDER — SPIRONOLACTONE 25 MG PO TABS
25.0000 mg | ORAL_TABLET | Freq: Every day | ORAL | Status: DC
  Administered 2023-02-27 – 2023-03-02 (×4): 25 mg via ORAL
  Filled 2023-02-27 (×2): qty 1
  Filled 2023-02-27: qty 2
  Filled 2023-02-27: qty 1

## 2023-02-27 MED ORDER — SENNOSIDES-DOCUSATE SODIUM 8.6-50 MG PO TABS: 1.0000 | ORAL_TABLET | Freq: Every evening | ORAL | Status: DC | PRN

## 2023-02-27 MED ORDER — SACUBITRIL-VALSARTAN 24-26 MG PO TABS
1.0000 | ORAL_TABLET | Freq: Two times a day (BID) | ORAL | Status: DC
  Administered 2023-02-27 – 2023-03-03 (×7): 1 via ORAL
  Filled 2023-02-27 (×8): qty 1

## 2023-02-27 MED ORDER — ASPIRIN 81 MG PO TBEC
81.0000 mg | DELAYED_RELEASE_TABLET | Freq: Every day | ORAL | Status: DC
  Administered 2023-02-28 – 2023-03-12 (×12): 81 mg via ORAL
  Filled 2023-02-27 (×12): qty 1

## 2023-02-27 MED ORDER — METOPROLOL SUCCINATE ER 50 MG PO TB24
50.0000 mg | ORAL_TABLET | Freq: Every day | ORAL | Status: DC
  Administered 2023-02-27 – 2023-03-12 (×13): 50 mg via ORAL
  Filled 2023-02-27 (×11): qty 1
  Filled 2023-02-27: qty 2
  Filled 2023-02-27: qty 1

## 2023-02-27 MED ORDER — ACETAMINOPHEN 650 MG RE SUPP: 650.0000 mg | Freq: Four times a day (QID) | RECTAL | Status: DC | PRN

## 2023-02-27 MED ORDER — GABAPENTIN 300 MG PO CAPS
300.0000 mg | ORAL_CAPSULE | Freq: Once | ORAL | Status: AC
  Administered 2023-02-27: 300 mg via ORAL
  Filled 2023-02-27: qty 1

## 2023-02-27 MED ORDER — OXYCODONE HCL 5 MG PO TABS
5.0000 mg | ORAL_TABLET | Freq: Four times a day (QID) | ORAL | Status: DC | PRN
  Administered 2023-02-27 – 2023-03-12 (×26): 5 mg via ORAL
  Filled 2023-02-27 (×26): qty 1

## 2023-02-27 NOTE — H&P (Signed)
Date: 02/27/2023               Patient Name:  Edward Rocha. MRN: [id number]  DOB: [birth year] Age / Sex: 57 y.o., male   PCP: Shelby Dubin, FNP              Medical Service: Internal Medicine Teaching Service              Attending Physician: Dr. Oswaldo Done, Marquita Palms, *    First Contact: Gillermina Phy, MS 3 Pager: [phone]  Second Contact: Dr. Gaylyn Cheers Breton Berns Pager: [phone]  Third Contact Dr. Elza Rafter Pager: [phone]       After Hours (After 5p/  First Contact Pager: [phone]  weekends / holidays): Second Contact Pager: [phone]   Chief Complaint: foot pain  History of Present Illness:  Edward Rocha. Is a 57 y.o. male living with CAD, PAD, HTN, T2DM, CKD3a, HFrEF, hypothyroidism, and cirrhosis who presents to the ED with foot pain and left heel wound.  Patient reports he has been experiencing pain in his feet for many years that he attributes to PAD and diabetic neuropathy, but the pain became more severe one month ago, particularly in his left foot. He did not seek medical care until today because his wife recently passed, so he was busy caring for her. He reports his pain feels like someone is "smashing his feet with a sledgehammer" while other times it is throbbing and sharp like "nails are going through it". The pain is primarily from just above the ankle down to the toes bilaterally. He currently rates his pain as a 9 out of 10. He reports gabapentin does not help relieve the pain and nothing seems to make the pain worse. He also reports decreased sensation of bilateral lower extremities that is chronic.   Patient also reports he noticed a wound on his left heel around one month ago. This wound has made it more difficult to walk. He has to use a walker and is limited to shorter distances. He does not recall any injuries to the area.  He denies fever, chills, cough, nausea, vomiting, diarrhea, abdominal pain, constipation, chest pain, palpitations, SOB, orthopnea, lower  extremity swelling, dysuria, urinary frequency, headache, lightheadedness, dizziness, syncope  ED Course: Patient brought to ED by brother-in-law. Vitals stable. Staples removed from right groin present from vascular surgery in 02/2022. IMTS consulted for left lower extremity wound management.   Meds:  - Nitro PRN - Atorvastatin 40 mg - Aspirin 81 mg  - Farxiga 10 mg  - Metoprolol succinate 50 mg daily (taking 25 mg currently) - Lasix 40 mg - Entresto 24-26 mg BID - Spiro 12.5 mg daily  - Gabapentin 300 mg TID PRN Current Meds  Medication Sig   acetaminophen (TYLENOL) 500 MG tablet Take 1 tablet (500 mg total) by mouth daily as needed for moderate pain or headache. (Patient taking differently: Take 1,000 mg by mouth daily as needed for moderate pain or headache.)   aspirin EC 81 MG tablet Take 81 mg by mouth daily.   atorvastatin (LIPITOR) 40 MG tablet Take 1 tablet (40 mg total) by mouth daily. (Patient taking differently: Take 40 mg by mouth at bedtime.)   dapagliflozin propanediol (FARXIGA) 10 MG TABS tablet Take 1 tablet (10 mg total) by mouth daily. (Patient taking differently: Take 10 mg by mouth at bedtime.)   ergocalciferol (VITAMIN D2) 1.25 MG (50000 UT) capsule Take 50,000 Units by mouth every Monday.   furosemide (LASIX)  40 MG tablet Take 1 tablet (40 mg total) by mouth daily. (Patient taking differently: Take 40 mg by mouth at bedtime.)   gabapentin (NEURONTIN) 300 MG capsule Take 300 mg by mouth See admin instructions. 300 mg at bedtime. May take up to 3 times a day as needed for pain.   metoprolol succinate (TOPROL XL) 50 MG 24 hr tablet Take 1 tablet (50 mg total) by mouth daily. (Patient taking differently: Take 50 mg by mouth at bedtime.)   nitroGLYCERIN (NITROSTAT) 0.4 MG SL tablet Place 0.4 mg under the tongue every 5 (five) minutes as needed for chest pain.   sacubitril-valsartan (ENTRESTO) 24-26 MG Take 1 tablet by mouth 2 (two) times daily.   spironolactone (ALDACTONE)  25 MG tablet Take 0.5 tablets (12.5 mg total) by mouth daily. (Patient taking differently: Take 25 mg by mouth at bedtime.)    Allergies: Allergies as of 02/27/2023 - Review Complete 02/27/2023  Allergen Reaction Noted   Other Other (See Comments) 08/05/2019   Past Medical History:  Diagnosis Date   Acute on chronic renal failure (HCC)    Essential hypertension    GERD (gastroesophageal reflux disease)    Hyperosmolar syndrome 2020   Hypothyroidism    Mild coronary artery disease    Cardiac catheterization September 2022 - [facility]   Nonischemic cardiomyopathy [facility])    PAD (peripheral artery disease) (HCC)    Thrombocytopenia (HCC)    Type 2 diabetes mellitus (HCC)     Family History:  - Mother: died from breast cancer - Sister: died from lung cancer - Father: died after GSW   Social History:  Patient previously worked as a Psychologist, 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 He lives at home alone in [place], [place]. Wife of 30+ years passed away in 12/27/22. Neighbor and nearby family (1 granddaughter, brother-in-law) provide social support. He is independent of ADL and iADLs. Ambulates with walker and cane. He is able to financially access medications. He has around a 40 pack-year smoking history but quit smoking in 2019. He denies recent alcohol or substance use. He formerly drank beer daily since a teenager but quit several years ago.  Review of Systems: A complete ROS was negative except as per HPI.  Physical Exam: Blood pressure 131/84, pulse (!) 109, temperature 97.7 F (36.5 C), temperature source Oral, resp. rate 19, height 6\' 1"  (1.854 m), weight 83.9 kg, SpO2 100 %. Constitutional: Middle-aged male sitting up in bed. Appears comfortable and in no acute distress. HENT: Normocephalic and atraumatic.  Eyes: EOMI. No scleral icterus.  Neck: Normal range of motion.  Cardiovascular: Tachycardic rate, regular rhythm. 2/6 systolic murmur best appreciated at the left upper  sternal border. Normal radial pulses bilaterally. Diminished PT pulses bilaterally. DP pulses difficult to palpate bilaterally. Left lower extremity swelling without pitting. No swelling of the RLE.  Pulmonary: Normal respiratory effort. No wheezes, rales, rhonchi, or crackles.   Abdominal: Soft. No tenderness. Normal bowel sounds. Hepatomegaly present. Musculoskeletal: Normal range of motion.    Neurological: Alert and oriented to person, place, and time. Sensation to touch markedly decreased in both feet diffusely from tips of toes to ankle.  Skin: Warm and dry. Wound on left heel with dark underlying tissue, no significant erythema/swelling surrounding, no active bleeding or drainage. Ulceration on left second toe on the distal end. Skin thickening on right heel at previous surgery site. Chronic dry skin changes of bilateral lower extremities.       EKG: personally  reviewed my interpretation is sinus tachycardia with atrial enlargement and LVH.  CXR: personally reviewed my interpretation is mild pulmonary edema.  Assessment & Plan by Problem: Principal Problem:   PAD (peripheral artery disease) (HCC)  Edward Rocha. Is a 58 y.o. male living with PAD, HTN, T2DM, CKD stage 3a, CHF, hypothyroidism, and cirrhosis who presents to the ED with foot pain and left heel wound and admitted for peripheral artery disease.  # PAD # Left heel wound Patient has longstanding history of PAD. He underwent placement of bilateral common iliac artery stents in April 2020 and left external iliac stent in July 2020. Since his BLE rest pain did not improve with these stents, he underwent left common femoral to distal posterior tibial bypass with PTFE graft in October 2020. Last seen by [facility] in May 2023 for right heel wound and followed by wound care with home health 3X weekly. Presents w/ left heel wound present over the last month. On exam, has left heel wound with dark underlying tissue and ulcer of the  distal left second toe. Also w/ decreased lower extremity pulses. Remains afebrile without leukocytosis. Mildly tachycardic but otherwise HDS. Lactic acid WNL. Left foot X-Wittke negative for osteomyelitis. LLE doppler negative for DVT but demonstrates stenosis of left deep femoral artery. Vascular surgery consulted and plans on arteriography tomorrow to assess for options for revascularization. NPO at midnight  - Appreciate vascular surgery recommendations - Abdominal aortogram tomorrow at 2 PM - Continue home atorvastatin 40 mg - Continue home aspirin 81 mg   # Non-ischemic cardiomyopathy # Pulmonary hypertension # HFrEF, EF 20-25% # CAD Patient follows with [facility] and was last seen in January 2024. Echocardiogram in February 2023 w/ EF 20-25%, LV global hypokinesis, LVH, and G2DD. It was discussed at his most recent visit that he should be taking metoprolol succinate 50 mg daily, but reports taking 25 mg daily. In the ED, EKG showed sinus tachycardia with biatrial enlargement and LVH. CXR with mild pulmonary edema. No signs of volume overload on exam. Will continue home medications. - Metoprolol succinate 50 mg daily - Entresto 24-26 mg BID - Spironolactone 12.5 mg daily  - Atorvastatin 40 mg - Aspirin 81 mg  - Farxiga 10 mg   # CKD stage 3a Required dialysis (via R IJ catheter) for 3-4 months due to acute metabolic encephalopathy in the setting of elevated ammonia following left common femoral to distal posterior tibial bypass. No longer receiving dialysis and no access in place. Baseline Cr ~ 1-1.2. Creatinine 1.36 today. - Trend BMP  # Hypertension Hypertensive in ED with MAPs 80s-low 100s, likely because he did not take his medications this morning. Will continue home Entresto and metoprolol. - Continue home Entresto 24-26 mg BID - Metoprolol succinate 50 mg daily   # Type 2 Diabetes A1c was 6.9% 1 year ago. He was previously prescribed insulin, Jardiance, and glipizide. His  diabetes is currently diet controlled. Will obtain A1c to assess need for pharmacotherapy that may aid in wound healing. He does have a history of diabetic neuropathy managed with gabapentin.  - Continue home Farxiga 10 mg daily - Continue home gabapentin 300 mg TID PRN - Pending A1c   # Hypothyroidism Most recent TSH elevated at 6.178, 1 year ago. He was previously taking levothyroxine but not currently.  - Repeat TSH/T4 as outpatient  # Cirrhosis # Hepatitis C Esophageal banding in October 2020 for 2 non-bleeding varices. Referred to gastroenterology at discharge in February 2023,  but no follow-up note w/ GI on file. Hepatitis C positive 1 year ago with viral load 100K. Treated with 8 week course of Mavyret. Hepatomegaly on exam today. Mildly elevated total bilirubin, but normal AST/ALT/Alk phos.  - Outpatient GI f/u  Diet:  HH/CM Bowel: Senna VTE: Lovenox IVF: none Code: Full PT/OT recs: none LOS: day 1  Prior to Admission Living Arrangement: home with self Anticipated Discharge Location: TBD Barriers to Discharge: continued management  Dispo: Admit patient to Observation with expected length of stay less than 2 midnights.  Signed: Whitman Hero, Medical Student 02/27/2023, 5:43 PM  Pager: [phone]

## 2023-02-27 NOTE — ED Notes (Signed)
Patient transported to Vascular via stretcher

## 2023-02-27 NOTE — Hospital Course (Addendum)
Subjective: No significant overnight events. Patient was able to ambulate to the bedside recliner with assistance. He confirms NWB when ambulating. His pain is well-controlled this morning, but he did have some left foot pain overnight. He denies chest pain or shortness of breath.   Physical Exam: Constitutional: Middle-aged male sitting in bedside recliner watching TV. Appears comfortable and in no acute distress. Cardiovascular: Regular rate, regular rhythm. 2/6 systolic murmur best appreciated at the left upper sternal border. LLE swelling with +2 pitting edema. RLE with +1 pitting edema. No erythema or calf tenderness. Pulmonary: Normal respiratory effort. No wheezes, rales, rhonchi, or crackles.   Abdominal: Soft. Non-distended. Normal bowel sounds.    Neurological: Alert and oriented to person, place, and time. Skin: Warm and dry. PRAFO on LLE with wound vac in place. Left second toe amputation with serosanguinous drainage and surrounding skin maceration on sole of foot.  ----------------------------------------------------------------------------------------------------------------  Edward Rocha. Is a 57 y.o. male living with PAD, HTN, T2DM, CKD stage 3a, CHF, hypothyroidism, and cirrhosis who presents to the ED with foot pain and left heel wound and admitted for peripheral artery disease.   # PAD # Left heel wound Patient presented to ED with 93-month history of left foot pain and left heel wound. On exam, patient had gangrenous wound over his left Achilles and gangrene of the left 2nd toe. Lower extremity pulses were diminished bilaterally. Doppler revealed dampened monophasic dorsalis pedis and posterior tibial signal on the left and fairly brisk posterior tibial signal with a dampened monophasic anterior tibial signal on the right. Patient underwent angioplasty with stent placement of left common iliac artery on 5/1. Due to multilevel disease, he underwent left common femoral  endarterectomy, redo femoral-tibial bypass, partial left second toe amputation, and debridement of left heel wound on 5/6. Vascular surgery concerned for wound infection intra-operatively, so cefepime, vancomycin, and Flagyl were started on 5/6. MRI of left foot showed subcortical marrow edema in the posterior calcaneus favored to be reactive marrow change vs. early osteomyelitis. Due to concern for calcaneal osteomyelitis, patient underwent partial excision of left calcaneus and excision of left heel skin/muscle fascia/Achilles tendon on 5/10. Calcaneal bone cultures grew Proteus penneri and Enterococcus faecalis. Patient started on levofloxacin 750 mg daily and Augmentin 875 mg BID based on culture sensitivities on 5/13. Cefepime, vancomycin, and Flagyl discontinued on 5/13. Will complete antibiotic course of 5 weeks at discharge. Patient will also be discharged with wound vac and PRAFO until follow-up with orthopedics. Patient also received medical therapy with Xarelto 2.5 mg BID, atorvastatin 80 mg, and aspirin 81 mg during hospitalization and will continue at discharge. Patient will need to follow-up outpatient with orthopedics, vascular surgery, infectious disease, and PCP.  # Non-ischemic cardiomyopathy # Pulmonary hypertension # HFrEF, EF 20-25% # CAD In the ED, EKG showed sinus tachycardia with biatrial enlargement and LVH. CXR with mild pulmonary edema. No signs of volume overload appreciated on exam. Home metoprolol succinate 50 mg and Farxiga 10 mg daily were continued throughout hospitalization. Due to fluctuations in renal function, lasix 40 mg, entresto 24-26 mg BID, and spironolactone 12.5 mg were held intermittently. All home medications were resumed by the day of discharge with patient tolerating well. Will continue metoprolol succinate 50 mg, Farxiga 10 mg, lasix 40 mg, entresto 24-26 mg BID, and spironolactone 12.5 mg at discharge.   # [id number] Patient with baseline creatinine of 1-1.2.  Renal function fluctuated throughout hospitalization likely secondary to contrast-induced nephropathy from angiogram on 5/1 and cardiorenal syndrome.  GDMT was held intermittently to allow recovery of renal function. Creatinine returned to baseline at 1.19 on day of discharge with all home GDMT resumed. Recommend repeat BMP outpatient.   # HTN BP stable throughout admission. BP WNL on day of discharge with all home GDMT resumed. Will continue home medications at discharge.  # T2DM A1c was 6.5% on admission. Blood glucose well-controlled throughout hospitalization with Farxiga 10 mg and SSI. Patient initially reported severe neuropathic pain, so increased from home gabapentin 300 mg TID PRN to gabapentin 500 mg TID. Neuropathic pain well-controlled on day of discharge. Will continue Farxiga 10 mg and gabapentin 400 mg TID at discharge.   # Cirrhosis # Hepatitis C On admission, hepatomegaly was noted on physical exam. CMP with total bilirubin of 1.4 but normal AST/ALT/Alkaline phosphate. Recommend outpatient GI follow-up.  # Hypothyroidism Most recent TSH elevated at 6.178, 1 year ago. He was previously taking levothyroxine but not currently. Recommend repeat TSH/T4 as outpatient. -------------------------=--------------------------------------------  5/4 Still having pain in his legs Feels better when his foot is hanging off the side of the bed  [ ]  ortho for biopsy   5/6: Has been NPO for surgery. Pain is alright right now, doesn't feel much in his R second toe.   5/8: Pain ok this morning, about 6/10. No chest pain, dyspnea. Tolerating PO intake. In good spirits  5/9 No sob or cp at rest. Did walk the halls with his boot yesterday, awkward. Said a little sob at that time. No more neuropathic pain. Only 1 small BM since surgery.  5/13 Describes incident where he was using bathroom and walker hit shins, feels okay now Trying not to put much weight on L foot Says walking with walker  went okay No cp or sob

## 2023-02-27 NOTE — ED Provider Notes (Signed)
[place] EMERGENCY DEPARTMENT AT [facility] Provider Note   CSN: [id number] Arrival date & time: 02/27/23  0845     History  Chief Complaint  Patient presents with   Foot Pain    Earnestine Mealing. is a 57 y.o. male.   Foot Pain  Patient resents with bilateral foot pain worse on left.  No swelling.  States he has some swelling at baseline but left has been much more swollen over the last few weeks.  Has wound behind the left heel.  Has had previous surgery for wound on the right heel.  Has had previous right vascular surgery under a year ago and still has staples in place.  Has had previous bypass in the left lower extremity also.  Some redness.  States he is on Neurontin for the pain but it is more severe.  Denies fevers.    Past Medical History:  Diagnosis Date   Acute on chronic renal failure (HCC)    Essential hypertension    GERD (gastroesophageal reflux disease)    Hyperosmolar syndrome 2020   Hypothyroidism    Mild coronary artery disease    Cardiac catheterization September 2022 - [facility]   Nonischemic cardiomyopathy [facility])    PAD (peripheral artery disease) (HCC)    Thrombocytopenia (HCC)    Type 2 diabetes mellitus (HCC)     Home Medications Prior to Admission medications   Medication Sig Start Date End Date Taking? Authorizing Provider  acetaminophen (TYLENOL) 500 MG tablet Take 1 tablet (500 mg total) by mouth daily as needed for moderate pain or headache. 12/19/21   Zannie Cove, MD  aspirin EC 81 MG tablet Take 81 mg by mouth daily. Swallow whole.    [provider]  atorvastatin (LIPITOR) 40 MG tablet Take 1 tablet (40 mg total) by mouth daily. 08/14/22   Jonelle Sidle, MD  dapagliflozin propanediol (FARXIGA) 10 MG TABS tablet Take 1 tablet (10 mg total) by mouth daily. 08/14/22   Jonelle Sidle, MD  Elastic Bandages & Supports (KNEE SUPPORT/ELASTIC/FIRM MED) MISC 1 each by Does not apply route as directed. Medium pressure  compression stockings Dx: leg edema 01/13/22   Jonelle Sidle, MD  ergocalciferol (VITAMIN D2) 1.25 MG (50000 UT) capsule Take 50,000 Units by mouth once a week.    [provider]  furosemide (LASIX) 40 MG tablet Take 1 tablet (40 mg total) by mouth daily. 08/14/22   Jonelle Sidle, MD  metoprolol succinate (TOPROL XL) 50 MG 24 hr tablet Take 1 tablet (50 mg total) by mouth daily. 11/02/22   Sharlene Dory, NP  nitroGLYCERIN (NITROSTAT) 0.4 MG SL tablet Place 0.4 mg under the tongue every 5 (five) minutes as needed for chest pain.    [provider]  pregabalin (LYRICA) 25 MG capsule Take 25 mg by mouth 3 (three) times daily. 02/07/22   [provider]  sacubitril-valsartan (ENTRESTO) 24-26 MG Take 1 tablet by mouth 2 (two) times daily. 11/02/22   Sharlene Dory, NP  spironolactone (ALDACTONE) 25 MG tablet Take 0.5 tablets (12.5 mg total) by mouth daily. 08/14/22   Jonelle Sidle, MD      Allergies    Other    Review of Systems   Review of Systems  Physical Exam Updated Vital Signs BP 118/87   Pulse (!) 104   Temp 98 F (36.7 C) (Oral)   Resp (!) 29   Ht 6\' 1"  (1.854 m)   Wt 83.9 kg  SpO2 100%   BMI 24.41 kg/m  Physical Exam Vitals and nursing note reviewed.  HENT:     Head: Atraumatic.  Cardiovascular:     Rate and Rhythm: Regular rhythm.  Musculoskeletal:     Comments: Edema bilateral lower extremities but worse on the left.  Does have erythema.  On the left heel posteriorly there is a wound that appears to go through the skin.  Foot not particular cold but unable to palpate pulse.  Does have monophasic Doppler pulse.  Able to palpate groin pulse.  Skin:    Capillary Refill: Capillary refill takes less than 2 seconds.  Neurological:     Mental Status: He is alert and oriented to person, place, and time.        ED Results / Procedures / Treatments   Labs (all labs ordered are listed, but only abnormal results are displayed) Labs  Reviewed  CBC WITH DIFFERENTIAL/PLATELET - Abnormal; Notable for the following components:      Result Value   Platelets 147 (*)    All other components within normal limits  URINALYSIS, ROUTINE W REFLEX MICROSCOPIC - Abnormal; Notable for the following components:   Hgb urine dipstick SMALL (*)    Protein, ur 30 (*)    All other components within normal limits  COMPREHENSIVE METABOLIC PANEL - Abnormal; Notable for the following components:   Sodium 133 (*)    Glucose, Bld 102 (*)    BUN 24 (*)    Creatinine, Ser 1.36 (*)    Total Protein 8.6 (*)    Albumin 3.1 (*)    Total Bilirubin 1.4 (*)    All other components within normal limits  LACTIC ACID, PLASMA  LACTIC ACID, PLASMA    EKG None  Radiology VAS [place] LOWER EXTREMITY VENOUS (DVT) (ONLY [facility] & [facility])  Result Date: 02/27/2023  Lower Venous DVT Study Patient Name:  Jatavius Ellenwood.  Date of Exam:   02/27/2023 Medical Rec #: [id number]         Accession #:    [id number] Date of Birth: 05/11/1966         Patient Gender: M Patient Age:   33 years Exam Location:  [facility] Procedure:      VAS [place] LOWER EXTREMITY VENOUS (DVT) Referring Phys: Benjiman Core --------------------------------------------------------------------------------  Indications: Pain, and Swelling.  Comparison Study: Previous bilateral 10/19/19 negative. Performing Technologist: McKayla Maag RVT, [place]  Examination Guidelines: A complete evaluation includes B-mode imaging, spectral Doppler, color Doppler, and power Doppler as needed of all accessible portions of each vessel. Bilateral testing is considered an integral part of a complete examination. Limited examinations for reoccurring indications may be performed as noted. The reflux portion of the exam is performed with the patient in reverse Trendelenburg.  +-----+---------------+---------+-----------+----------+--------------+ RIGHTCompressibilityPhasicitySpontaneityPropertiesThrombus Aging  +-----+---------------+---------+-----------+----------+--------------+ CFV  Full           Yes      Yes                                 +-----+---------------+---------+-----------+----------+--------------+ SFJ  Full                                                        +-----+---------------+---------+-----------+----------+--------------+   +---------+---------------+---------+-----------+----------+--------------+ LEFT  CompressibilityPhasicitySpontaneityPropertiesThrombus Aging +---------+---------------+---------+-----------+----------+--------------+ CFV      Full           Yes      Yes                                 +---------+---------------+---------+-----------+----------+--------------+ SFJ      Full                                                        +---------+---------------+---------+-----------+----------+--------------+ FV Prox  Full                                                        +---------+---------------+---------+-----------+----------+--------------+ FV Mid   Full                                                        +---------+---------------+---------+-----------+----------+--------------+ FV DistalFull                                                        +---------+---------------+---------+-----------+----------+--------------+ PFV      Full                                                        +---------+---------------+---------+-----------+----------+--------------+ POP      Full           Yes      Yes                                 +---------+---------------+---------+-----------+----------+--------------+ PTV      Full                                                        +---------+---------------+---------+-----------+----------+--------------+ PERO     Full                                                         +---------+---------------+---------+-----------+----------+--------------+    Summary: RIGHT: - No evidence of common femoral vein obstruction.  LEFT: - There is no evidence of deep vein thrombosis in the lower extremity.  - No cystic structure found in the popliteal fossa. - Ultrasound characteristics of enlarged lymph nodes noted in the groin.  *See table(s)  above for measurements and observations. Electronically signed by Waverly Ferrari MD on 02/27/2023 at 1:02:46 PM.    Final    VAS [place] LOWER EXTREMITY ARTERIAL DUPLEX (ONLY [facility])  Result Date: 02/27/2023 LOWER EXTREMITY ARTERIAL DUPLEX STUDY Patient Name:  Mylon Mabey.  Date of Exam:   02/27/2023 Medical Rec #: [id number]         Accession #:    [id number] Date of Birth: [birth year]         Patient Gender: M Patient Age:   30 years Exam Location:  [facility] Procedure:      VAS [place] LOWER EXTREMITY ARTERIAL DUPLEX Referring Phys: Benjiman Core --------------------------------------------------------------------------------  Indications: Rest pain, ulceration, and peripheral artery disease. High Risk Factors: Hypertension, Diabetes, past history of smoking.  Current ABI: Previous ABI 05/20/19              Non-compressible Limitations: Acoustic shadowing Comparison Study: No previous study. Performing Technologist: McKayla Maag RVT, [place]  Examination Guidelines: A complete evaluation includes B-mode imaging, spectral Doppler, color Doppler, and power Doppler as needed of all accessible portions of each vessel. Bilateral testing is considered an integral part of a complete examination. Limited examinations for reoccurring indications may be performed as noted.   +-----------+--------+-----+---------------+----------+------------------------+ LEFT       PSV cm/sRatioStenosis       Waveform  Comments                 +-----------+--------+-----+---------------+----------+------------------------+ EIA Mid    113                          monophasic                         +-----------+--------+-----+---------------+----------+------------------------+ CFA Mid    63                          monophasic                         +-----------+--------+-----+---------------+----------+------------------------+ DFA        212          50-74% stenosismonophasic                         +-----------+--------+-----+---------------+----------+------------------------+ SFA Prox   46                          monophasic                         +-----------+--------+-----+---------------+----------+------------------------+ SFA Mid    25                          monophasic                         +-----------+--------+-----+---------------+----------+------------------------+ SFA Distal 25                          monophasic                         +-----------+--------+-----+---------------+----------+------------------------+ POP Distal 110  monophasicElevated focal velocity                                                   unable to accurately                                                      assess stenosis                                                           secondary to acoustic                                                     shadowing                +-----------+--------+-----+---------------+----------+------------------------+ TP Trunk                                         Not visualized due to                                                     acoustic shadowing       +-----------+--------+-----+---------------+----------+------------------------+ ATA Distal 22                          monophasic                         +-----------+--------+-----+---------------+----------+------------------------+ PTA Distal 21                          monophasic                          +-----------+--------+-----+---------------+----------+------------------------+ PERO Distal29                          monophasic                         +-----------+--------+-----+---------------+----------+------------------------+ DP         24                          monophasic                         +-----------+--------+-----+---------------+----------+------------------------+ A focal velocity elevation of 212 cm/s was obtained at [facility] with a VR of 1.4. Findings are characteristic of 50-74% stenosis.  Summary: Left: 50-74% stenosis noted in  the deep femoral artery. Abnormal left external iliac artery waveform suggestive of inflow disease.  See table(s) above for measurements and observations. Electronically signed by Waverly Ferrari MD on 02/27/2023 at 1:02:28 PM.    Final    DG Foot Complete Left  Result Date: 02/27/2023 CLINICAL DATA:  swelling, pain EXAM: LEFT FOOT - COMPLETE 3+ VIEW COMPARISON:  None Available. FINDINGS: Osteopenia. No acute fracture or dislocation. Mild degenerative changes of the first MTP. Possible small periarticular erosion at the base of the first TMT which may reflect underlying crystalline arthropathy. No unexpected radiopaque foreign body. Soft tissue irregularity of the heel and lateral fifth toe. Diffuse vascular calcifications. No subjacent cortical erosions or irregularity. Soft tissue edema. IMPRESSION: 1.  No radiographic evidence of osteomyelitis. 2. Possible small periarticular erosion at the base of the first TMT which may reflect underlying crystalline arthropathy. Electronically Signed   By: Meda Klinefelter M.D.   On: 02/27/2023 09:46   DG Chest 2 View  Result Date: 02/27/2023 CLINICAL DATA:  r/o sepsis EXAM: CHEST - 2 VIEW COMPARISON:  December 13, 2021 FINDINGS: The cardiomediastinal silhouette is unchanged and enlarged in contour. No significant pleural effusion. No pneumothorax. Perihilar vascular fullness, peribronchial cuffing and  mild diffuse interstitial prominence. Visualized abdomen is unremarkable. Atherosclerotic calcifications. IMPRESSION: Constellation of findings are favored to reflect mild pulmonary edema. Electronically Signed   By: Meda Klinefelter M.D.   On: 02/27/2023 09:42    Procedures Procedures    Medications Ordered in ED Medications - No data to display  ED Course/ Medical Decision Making/ A&P                             Medical Decision Making Amount and/or Complexity of Data Reviewed Labs: ordered. Radiology: ordered.  Risk Decision regarding hospitalization.   Patient with acute on chronic pain in the left lower extremity.  Does have redness and swelling.  Venous Doppler done to evaluate for DVT.  No DVT seen.  Arterial Doppler done due to difficulty getting pulse.  Does have monophasic pulse diffusely.  Does have previous bypass on the left and is also since had intervention on the right side.  CBC overall reassuring CMP still pending.  Also history of CHF.  I think patient like required mission the hospital for further workup of the heel wound and potential infection.  I will remove staples from right groin at site of previous surgery.  This also gives an idea of the overall low level of care that he is getting.        Final Clinical Impression(s) / ED Diagnoses Final diagnoses:  Foot pain, left  Peripheral edema  PVD (peripheral vascular disease) (HCC)    Rx / DC Orders ED Discharge Orders     None         Benjiman Core, MD 02/27/23 1505

## 2023-02-27 NOTE — Consult Note (Signed)
ASSESSMENT & PLAN   PERIPHERAL ARTERIAL DISEASE WITH GANGRENE LEFT FOOT: This patient has a gangrenous wound over his Achilles and also gangrene of the left 2nd toe.  Based on his exam, he has evidence of inflow disease on the left with a markedly diminished femoral pulse.  He also has an occluded bypass graft.  Given his multilevel arterial occlusive disease with gangrene over the Achilles this is clearly a limb threatening situation.  I think his only chance for limb salvage is to proceed with arteriography and look for potential options for revascularization.  His previous bypass graft in 2020 was done with a composite PTFE vein graft given that the vein was not adequate for an all autogenous bypass.  Certainly his options will be limited.  Given his recent surgery on the right groin his arteriogram will likely have to be done via a left femoral approach or left brachial approach.  He has a slightly diminished left radial pulse.  LEFT CAROTID BRUIT: I have ordered a carotid duplex scan.  REASON FOR CONSULT:    Peripheral arterial disease with wound on the left heel.  The consult was requested by the medical service.  HPI:   Edward Rocha. is a 57 y.o. male who presents to the emergency department today with left foot pain and a heel ulcer.  The patient is being admitted by the medical service.  He has a complicated vascular history.  He was previously followed by Dr. Chestine Spore but was then lost to follow-up in 2020 after his bypass.  His previous procedures are noted below.   On 02/06/2019 he had bilateral common iliac stents placed.  On 04/30/2019 he had placement of a left external iliac artery stent.   On 08/11/2019 he had a left common femoral artery to posterior tibial artery bypass with a composite 6 mm PTFE graft and a nonreversed great saphenous vein.  After his bypass, he was lost to follow-up.  Most recently, he underwent vascular surgery at [facility] on 01/27/2022 and then on 02/02/2022.  He  had a thromboendarterectomy and patch angioplasty of the right common femoral artery angioplasty and stenting of the right external iliac artery.  In addition he underwent angioplasty and stenting of the right superficial femoral artery with a drug eluting stent and right popliteal artery angioplasty with a drug-coated balloon.  He is also undergone wound debridement and partial calcanectomy.  On my history he required multiple operations on the right leg for a heel wound.  He had revascularization as noted above.  He had placement of a wound VAC and 2 grafts placed.  He states that the wound on the left heel began about a month ago.  He does not remember any specific injury.  He does admit to bilateral lower extremity claudication which is more significant on the left side.  He describes some rest pain in the left foot.  He does have a history of chronic kidney disease, type 2 diabetes, hyperlipidemia, and congestive heart failure with an ejection fraction of 20 to 25%.  He also has severe pulmonary hypertension and nonobstructive coronary disease.  He quit smoking in 2009.  Past Medical History:  Diagnosis Date   Acute on chronic renal failure [facility])    Essential hypertension    GERD (gastroesophageal reflux disease)    Hyperosmolar syndrome 2020   Hypothyroidism    Mild coronary artery disease    Cardiac catheterization September 2022 - [facility]   Nonischemic cardiomyopathy [facility])  PAD (peripheral artery disease) (HCC)    Thrombocytopenia (HCC)    Type 2 diabetes mellitus (HCC)     Family History  Problem Relation Age of Onset   Alcohol abuse Father    Cancer Mother    Alcohol abuse Brother    Bipolar disorder Son    Bipolar disorder Daughter     SOCIAL HISTORY: Social History   Tobacco Use   Smoking status: Former    Packs/day: 1.00    Years: 36.00    Additional pack years: 0.00    Total pack years: 36.00    Types: Cigarettes    Quit date: 01/25/2019    Years since  quitting: 4.0    Passive exposure: Never   Smokeless tobacco: Never  Substance Use Topics   Alcohol use: Not Currently    Comment: per pt's wife, pt is an alcoholic but just recently stopped drinking in 04/2019    Allergies  Allergen Reactions   Other     Pt received platelets and had a bad reaction from infusion     Current Facility-Administered Medications  Medication Dose Route Frequency Provider Last Rate Last Admin   acetaminophen (TYLENOL) tablet 650 mg  650 mg Oral Q6H PRN Atway, Rayann N, DO       Or   acetaminophen (TYLENOL) suppository 650 mg  650 mg Rectal Q6H PRN Atway, Rayann N, DO       enoxaparin (LOVENOX) injection 40 mg  40 mg Subcutaneous Q24H Atway, Rayann N, DO       senna-docusate (Senokot-S) tablet 1 tablet  1 tablet Oral QHS PRN Atway, Rayann N, DO       Current Outpatient Medications  Medication Sig Dispense Refill   acetaminophen (TYLENOL) 500 MG tablet Take 1 tablet (500 mg total) by mouth daily as needed for moderate pain or headache. 30 tablet 0   aspirin EC 81 MG tablet Take 81 mg by mouth daily. Swallow whole.     atorvastatin (LIPITOR) 40 MG tablet Take 1 tablet (40 mg total) by mouth daily. 90 tablet 1   dapagliflozin propanediol (FARXIGA) 10 MG TABS tablet Take 1 tablet (10 mg total) by mouth daily. 90 tablet 1   Elastic Bandages & Supports (KNEE SUPPORT/ELASTIC/FIRM MED) MISC 1 each by Does not apply route as directed. Medium pressure compression stockings Dx: leg edema 1 each 0   ergocalciferol (VITAMIN D2) 1.25 MG (50000 UT) capsule Take 50,000 Units by mouth once a week.     furosemide (LASIX) 40 MG tablet Take 1 tablet (40 mg total) by mouth daily. 90 tablet 1   metoprolol succinate (TOPROL XL) 50 MG 24 hr tablet Take 1 tablet (50 mg total) by mouth daily. 30 tablet 6   nitroGLYCERIN (NITROSTAT) 0.4 MG SL tablet Place 0.4 mg under the tongue every 5 (five) minutes as needed for chest pain.     pregabalin (LYRICA) 25 MG capsule Take 25 mg by mouth  3 (three) times daily.     sacubitril-valsartan (ENTRESTO) 24-26 MG Take 1 tablet by mouth 2 (two) times daily. 60 tablet 6   spironolactone (ALDACTONE) 25 MG tablet Take 0.5 tablets (12.5 mg total) by mouth daily. 45 tablet 1    REVIEW OF SYSTEMS:  [X]  denotes positive finding, [ ]  denotes negative finding Cardiac  Comments:  Chest pain or chest pressure:    Shortness of breath upon exertion: x   Short of breath when lying flat:    Irregular heart rhythm:  Vascular    Pain in calf, thigh, or hip brought on by ambulation: x   Pain in feet at night that wakes you up from your sleep:  x   Blood clot in your veins:    Leg swelling:         Pulmonary    Oxygen at home:    Productive cough:     Wheezing:         Neurologic    Sudden weakness in arms or legs:     Sudden numbness in arms or legs:     Sudden onset of difficulty speaking or slurred speech:    Temporary loss of vision in one eye:     Problems with dizziness:         Gastrointestinal    Blood in stool:     Vomited blood:         Genitourinary    Burning when urinating:     Blood in urine:        Psychiatric    Major depression:         Hematologic    Bleeding problems:    Problems with blood clotting too easily:        Skin    Rashes or ulcers: x       Constitutional    Fever or chills:    -  PHYSICAL EXAM:   Vitals:   02/27/23 1230 02/27/23 1302 02/27/23 1400 02/27/23 1531  BP: 118/87  119/85 (!) 149/114  Pulse: (!) 104  (!) 109 (!) 110  Resp: (!) 29  (!) 22 (!) 21  Temp:  98 F (36.7 C)  98.1 F (36.7 C)  TempSrc:  Oral  Oral  SpO2: 100%  100% 100%  Weight:      Height:       Body mass index is 24.41 kg/m. GENERAL: The patient is a well-nourished male, in no acute distress. The vital signs are documented above. CARDIAC: There is a regular rate and rhythm.  VASCULAR: He has a left carotid bruit. He has a diminished left radial pulse and palpable left brachial pulse. On the left  side, which is the site of concern, he has a diminished femoral pulse.  This is barely palpable.  I cannot palpate popliteal or pedal pulses.  He has a dampened monophasic dorsalis pedis and posterior tibial signal on the left. On the right side has had recent surgery and there is 1 small area at the superior aspect of the incision which is still pending.  He does have a palpable right femoral pulse.  He has a fairly brisk posterior tibial signal on the right with the Doppler with a dampened monophasic anterior tibial signal. PULMONARY: There is good air exchange bilaterally without wheezing or rales. ABDOMEN: Soft and non-tender with normal pitched bowel sounds.  MUSCULOSKELETAL: There are no major deformities. NEUROLOGIC: No focal weakness or paresthesias are detected. SKIN: He has a wound over his Achilles on the left as documented below.   He has gangrene of the left second toe.   PSYCHIATRIC: The patient has a normal affect.  DATA:    LEFT LOWER EXTREMITY ARTERIAL DUPLEX: I have independently interpreted his arterial duplex of the left lower extremity today.  He does have monophasic signals in the external iliac artery and common femoral artery on the left.  There appears to be a moderate stenosis in the deep femoral artery.  He has monophasic flow throughout the superficial femoral artery and  in the anterior tibial, posterior tibial, and peroneal arteries.  LABS: His creatinine is 1.36.  GFR is greater than 60.  Platelets 147,000.  X-Farnam LEFT FOOT: No evidence of osteomyelitis.  VENOUS DUPLEX: I have independently interpreted his venous duplex scan today.  There is no evidence of DVT in the left lower extremity.  Waverly Ferrari [facility]

## 2023-02-27 NOTE — ED Triage Notes (Addendum)
Pt reports bilateral foot pain due to neuropathy. Pt with wound to left heel with small amount of drainage. Left foot swollen. Unable to doppler a pulse in left foot.

## 2023-02-27 NOTE — ED Notes (Signed)
ED TO INPATIENT HANDOFF REPORT  ED Nurse Name and Phone #: Jannely Henthorn [phone]  S Name/Age/Gender Edward Rocha. 57 y.o. male Room/Bed: 018C/018C  Code Status   Code Status: Full Code  Home/SNF/Other Home Patient oriented to: situation Is this baseline? Yes   Triage Complete: Triage complete  Chief Complaint PAD (peripheral artery disease) (HCC) [I73.9]  Triage Note Pt reports bilateral foot pain due to neuropathy. Pt with wound to left heel with small amount of drainage. Left foot swollen. Unable to doppler a pulse in left foot.    Allergies Allergies  Allergen Reactions   Other     Pt received platelets and had a bad reaction from infusion     Level of Care/Admitting Diagnosis ED Disposition     ED Disposition  Admit   Condition  --   Comment  Hospital Area: [facility] [[id number]]  Level of Care: Med-Surg [16]  May place patient in observation at [facility] or [facility] if equivalent level of care is available:: No  Covid Evaluation: Asymptomatic - no recent exposure (last 10 days) testing not required  Diagnosis: PAD (peripheral artery disease) [facility]) [[id number]]  Admitting Physician: Tyson Alias [[id number]]  Attending Physician: Tyson Alias [[id number]]          B Medical/Surgery History Past Medical History:  Diagnosis Date   Acute on chronic renal failure (HCC)    Essential hypertension    GERD (gastroesophageal reflux disease)    Hyperosmolar syndrome 2020   Hypothyroidism    Mild coronary artery disease    Cardiac catheterization September 2022 - [facility]   Nonischemic cardiomyopathy [facility])    PAD (peripheral artery disease) (HCC)    Thrombocytopenia (HCC)    Type 2 diabetes mellitus (HCC)    Past Surgical History:  Procedure Laterality Date   ABDOMINAL AORTOGRAM W/LOWER EXTREMITY N/A 02/06/2019   Procedure: ABDOMINAL AORTOGRAM W/LOWER EXTREMITY;  Surgeon: Cephus Shelling, MD;  Location: [facility] INVASIVE CV LAB;   Service: Cardiovascular;  Laterality: N/A;  bilateral   ABDOMINAL AORTOGRAM W/LOWER EXTREMITY N/A 04/30/2019   Procedure: ABDOMINAL AORTOGRAM W/LOWER EXTREMITY;  Surgeon: Cephus Shelling, MD;  Location: [facility] INVASIVE CV LAB;  Service: Cardiovascular;  Laterality: N/A;   AORTOGRAM  08/11/2019   Procedure: Aortogram;  Surgeon: Cephus Shelling, MD;  Location: [facility] OR;  Service: Vascular;;   ESOPHAGEAL BANDING  08/22/2019   Procedure: ESOPHAGEAL BANDING;  Surgeon: Rachael Fee, MD;  Location: [facility] ENDOSCOPY;  Service: Endoscopy;;   ESOPHAGOGASTRODUODENOSCOPY N/A 08/22/2019   Procedure: ESOPHAGOGASTRODUODENOSCOPY (EGD);  Surgeon: Rachael Fee, MD;  Location: [facility] ENDOSCOPY;  Service: Endoscopy;  Laterality: N/A;   FEMORAL-POPLITEAL BYPASS GRAFT Left 08/11/2019   Procedure: BYPASS LEFT FEMORAL-DISTAL POPLITEAL ARTERY USING PROPATEN GRAFT;  Surgeon: Cephus Shelling, MD;  Location: [facility] OR;  Service: Vascular;  Laterality: Left;   INSERTION OF DIALYSIS CATHETER Right 09/09/2019   Procedure: INSERTION OF DIALYSIS CATHETER;  Surgeon: Maeola Harman, MD;  Location: [facility] OR;  Service: Vascular;  Laterality: Right;   MULTIPLE TOOTH EXTRACTIONS     PERIPHERAL VASCULAR INTERVENTION  02/06/2019   Procedure: PERIPHERAL VASCULAR INTERVENTION;  Surgeon: Cephus Shelling, MD;  Location: [facility] INVASIVE CV LAB;  Service: Cardiovascular;;  Bilateral Iliacs   PERIPHERAL VASCULAR INTERVENTION  04/30/2019   Procedure: PERIPHERAL VASCULAR INTERVENTION;  Surgeon: Cephus Shelling, MD;  Location: [facility] INVASIVE CV LAB;  Service: Cardiovascular;;  Stent - Lt. Iliac    REMOVAL OF A DIALYSIS CATHETER Right  09/09/2019   Procedure: Removal Of A Dialysis Catheter;  Surgeon: Maeola Harman, MD;  Location: [facility] OR;  Service: Vascular;  Laterality: Right;   RIGHT HEART CATH N/A 12/12/2021   Procedure: RIGHT HEART CATH;  Surgeon: Laurey Morale, MD;  Location: [facility] INVASIVE CV LAB;  Service: Cardiovascular;   Laterality: N/A;   ULTRASOUND GUIDANCE FOR VASCULAR ACCESS Right 09/09/2019   Procedure: Ultrasound Guidance For Vascular Access;  Surgeon: Maeola Harman, MD;  Location: [facility] OR;  Service: Vascular;  Laterality: Right;   VASCULAR SURGERY       A IV Location/Drains/Wounds Patient Lines/Drains/Airways Status     Active Line/Drains/Airways     Name Placement date Placement time Site Days   Sheath 12/12/21 Right Internal Jugular 12/12/21  1409  Internal Jugular  442   Incision (Closed) 08/11/19 Leg Left 08/11/19  0958  -- 1296   Incision (Closed) 09/04/19 Groin Anterior;Left 09/04/19  2000  -- 1272   Incision (Closed) 09/09/19 Chest Right 09/09/19  0715  -- 1267   Pressure Injury 08/26/19 Rectum Circumferential Stage II -  Partial thickness loss of dermis presenting as a shallow open ulcer with a red, pink wound bed without slough. multiple perirectal breakdowns 08/26/19  2057  -- 1281            Intake/Output Last 24 hours No intake or output data in the 24 hours ending 02/27/23 1542  Labs/Imaging Results for orders placed or performed during the hospital encounter of 02/27/23 (from the past 48 hour(s))  Lactic acid, plasma     Status: None   Collection Time: 02/27/23  8:58 AM  Result Value Ref Range   Lactic Acid, Venous 1.8 0.5 - 1.9 mmol/L    Comment: Performed at [facility], [address] [place], [place] [address]  CBC with Differential     Status: Abnormal   Collection Time: 02/27/23  8:58 AM  Result Value Ref Range   WBC 6.0 4.0 - 10.5 K/uL   RBC 4.70 4.22 - 5.81 MIL/uL   Hemoglobin 13.2 13.0 - 17.0 g/dL   HCT 60.4 54.0 - 98.1 %   MCV 86.2 80.0 - 100.0 fL   MCH 28.1 26.0 - 34.0 pg   MCHC 32.6 30.0 - 36.0 g/dL   RDW 19.1 47.8 - 29.5 %   Platelets 147 (L) 150 - 400 K/uL    Comment: REPEATED TO VERIFY   nRBC 0.0 0.0 - 0.2 %   Neutrophils Relative % 70 %   Neutro Abs 4.2 1.7 - 7.7 K/uL   Lymphocytes Relative 18 %   Lymphs Abs 1.1 0.7 - 4.0 K/uL    Monocytes Relative 9 %   Monocytes Absolute 0.6 0.1 - 1.0 K/uL   Eosinophils Relative 1 %   Eosinophils Absolute 0.1 0.0 - 0.5 K/uL   Basophils Relative 1 %   Basophils Absolute 0.0 0.0 - 0.1 K/uL   Immature Granulocytes 1 %   Abs Immature Granulocytes 0.03 0.00 - 0.07 K/uL    Comment: Performed at [facility], [address] [place], [place] [address]  Lactic acid, plasma     Status: None   Collection Time: 02/27/23 10:58 AM  Result Value Ref Range   Lactic Acid, Venous 1.6 0.5 - 1.9 mmol/L    Comment: Performed at [facility], [address] [place], [place] [address]  Comprehensive metabolic panel     Status: Abnormal   Collection Time: 02/27/23  1:33 PM  Result Value Ref Range   Sodium 133 (L) 135 - 145 mmol/L   Potassium 4.6 3.5 - 5.1 mmol/L   Chloride 101 98 - 111 mmol/L   CO2 22 22 - 32 mmol/L   Glucose, Bld 102 (H) 70 - 99 mg/dL    Comment: Glucose reference range applies only to samples taken after fasting for at least 8 hours.   BUN 24 (H) 6 - 20 mg/dL   Creatinine, Ser 1.61 (H) 0.61 - 1.24 mg/dL   Calcium 9.1 8.9 - 09.6 mg/dL   Total Protein 8.6 (H) 6.5 - 8.1 g/dL   Albumin 3.1 (L) 3.5 - 5.0 g/dL   AST 25 15 - 41 U/L   ALT 13 0 - 44 U/L   Alkaline Phosphatase 61 38 - 126 U/L   Total Bilirubin 1.4 (H) 0.3 - 1.2 mg/dL   GFR, Estimated >04 >54 mL/min    Comment: (NOTE) Calculated using the CKD-EPI Creatinine Equation (2021)    Anion gap 10 5 - 15    Comment: Performed at [facility], [address] [place], [place] [address]  Urinalysis, Routine w reflex microscopic -Urine, Clean Catch     Status: Abnormal   Collection Time: 02/27/23  1:45 PM  Result Value Ref Range   Color, Urine YELLOW YELLOW   APPearance CLEAR CLEAR   Specific Gravity, Urine 1.011 1.005 - 1.030   pH 5.0 5.0 - 8.0   Glucose, UA NEGATIVE NEGATIVE mg/dL   Hgb urine dipstick SMALL (A) NEGATIVE   Bilirubin Urine NEGATIVE NEGATIVE   Ketones, ur NEGATIVE NEGATIVE mg/dL    Protein, ur 30 (A) NEGATIVE mg/dL   Nitrite NEGATIVE NEGATIVE   Leukocytes,Ua NEGATIVE NEGATIVE   RBC / HPF 0-5 0 - 5 RBC/hpf   WBC, UA 0-5 0 - 5 WBC/hpf   Bacteria, UA NONE SEEN NONE SEEN   Squamous Epithelial / HPF 0-5 0 - 5 /HPF    Comment: Performed at [facility], [address], [place], [place] [address]   VAS [place] LOWER EXTREMITY VENOUS (DVT) (ONLY [facility])  Result Date: 02/27/2023  Lower Venous DVT Study Patient Name:  Taran Haynesworth.  Date of Exam:   02/27/2023 Medical Rec #: [id number]         Accession #:    [id number] Date of Birth: 1966-03-12         Patient Gender: M Patient Age:   59 years Exam Location:  [facility] Procedure:      VAS [place] LOWER EXTREMITY VENOUS (DVT) Referring Phys: Benjiman Core --------------------------------------------------------------------------------  Indications: Pain, and Swelling.  Comparison Study: Previous bilateral 10/19/19 negative. Performing Technologist: McKayla Maag RVT, [place]  Examination Guidelines: A complete evaluation includes B-mode imaging, spectral Doppler, color Doppler, and power Doppler as needed of all accessible portions of each vessel. Bilateral testing is considered an integral part of a complete examination. Limited examinations for reoccurring indications may be performed as noted. The reflux portion of the exam is performed with the patient in reverse Trendelenburg.  +-----+---------------+---------+-----------+----------+--------------+ RIGHTCompressibilityPhasicitySpontaneityPropertiesThrombus Aging +-----+---------------+---------+-----------+----------+--------------+ CFV  Full           Yes      Yes                                 +-----+---------------+---------+-----------+----------+--------------+ SFJ  Full                                                        +-----+---------------+---------+-----------+----------+--------------+    +---------+---------------+---------+-----------+----------+--------------+  LEFT     CompressibilityPhasicitySpontaneityPropertiesThrombus Aging +---------+---------------+---------+-----------+----------+--------------+ CFV      Full           Yes      Yes                                 +---------+---------------+---------+-----------+----------+--------------+ SFJ      Full                                                        +---------+---------------+---------+-----------+----------+--------------+ FV Prox  Full                                                        +---------+---------------+---------+-----------+----------+--------------+ FV Mid   Full                                                        +---------+---------------+---------+-----------+----------+--------------+ FV DistalFull                                                        +---------+---------------+---------+-----------+----------+--------------+ PFV      Full                                                        +---------+---------------+---------+-----------+----------+--------------+ POP      Full           Yes      Yes                                 +---------+---------------+---------+-----------+----------+--------------+ PTV      Full                                                        +---------+---------------+---------+-----------+----------+--------------+ PERO     Full                                                        +---------+---------------+---------+-----------+----------+--------------+    Summary: RIGHT: - No evidence of common femoral vein obstruction.  LEFT: - There is no evidence of deep vein thrombosis in the lower extremity.  - No cystic structure found in the popliteal fossa. - Ultrasound characteristics of enlarged lymph nodes noted in the  groin.  *See table(s) above for measurements and observations. Electronically signed by  Waverly Ferrari MD on 02/27/2023 at 1:02:46 PM.    Final    VAS [place] LOWER EXTREMITY ARTERIAL DUPLEX (ONLY [facility] & [facility])  Result Date: 02/27/2023 LOWER EXTREMITY ARTERIAL DUPLEX STUDY Patient Name:  Gottlieb Zuercher.  Date of Exam:   02/27/2023 Medical Rec #: [id number]         Accession #:    [id number] Date of Birth: 01/14/1966         Patient Gender: M Patient Age:   73 years Exam Location:  [facility] Procedure:      VAS [place] LOWER EXTREMITY ARTERIAL DUPLEX Referring Phys: Benjiman Core --------------------------------------------------------------------------------  Indications: Rest pain, ulceration, and peripheral artery disease. High Risk Factors: Hypertension, Diabetes, past history of smoking.  Current ABI: Previous ABI 05/20/19              Non-compressible Limitations: Acoustic shadowing Comparison Study: No previous study. Performing Technologist: McKayla Maag RVT, [place]  Examination Guidelines: A complete evaluation includes B-mode imaging, spectral Doppler, color Doppler, and power Doppler as needed of all accessible portions of each vessel. Bilateral testing is considered an integral part of a complete examination. Limited examinations for reoccurring indications may be performed as noted.   +-----------+--------+-----+---------------+----------+------------------------+ LEFT       PSV cm/sRatioStenosis       Waveform  Comments                 +-----------+--------+-----+---------------+----------+------------------------+ EIA Mid    113                         monophasic                         +-----------+--------+-----+---------------+----------+------------------------+ CFA Mid    63                          monophasic                         +-----------+--------+-----+---------------+----------+------------------------+ DFA        212          50-74% stenosismonophasic                          +-----------+--------+-----+---------------+----------+------------------------+ SFA Prox   46                          monophasic                         +-----------+--------+-----+---------------+----------+------------------------+ SFA Mid    25                          monophasic                         +-----------+--------+-----+---------------+----------+------------------------+ SFA Distal 25                          monophasic                         +-----------+--------+-----+---------------+----------+------------------------+ POP Distal 110  monophasicElevated focal velocity                                                   unable to accurately                                                      assess stenosis                                                           secondary to acoustic                                                     shadowing                +-----------+--------+-----+---------------+----------+------------------------+ TP Trunk                                         Not visualized due to                                                     acoustic shadowing       +-----------+--------+-----+---------------+----------+------------------------+ ATA Distal 22                          monophasic                         +-----------+--------+-----+---------------+----------+------------------------+ PTA Distal 21                          monophasic                         +-----------+--------+-----+---------------+----------+------------------------+ PERO Distal29                          monophasic                         +-----------+--------+-----+---------------+----------+------------------------+ DP         24                          monophasic                         +-----------+--------+-----+---------------+----------+------------------------+ A  focal velocity elevation of 212 cm/s was obtained at [facility] with a VR of 1.4. Findings are characteristic of 50-74% stenosis.  Summary: Left: 50-74% stenosis noted in  the deep femoral artery. Abnormal left external iliac artery waveform suggestive of inflow disease.  See table(s) above for measurements and observations. Electronically signed by Waverly Ferrari MD on 02/27/2023 at 1:02:28 PM.    Final    DG Foot Complete Left  Result Date: 02/27/2023 CLINICAL DATA:  swelling, pain EXAM: LEFT FOOT - COMPLETE 3+ VIEW COMPARISON:  None Available. FINDINGS: Osteopenia. No acute fracture or dislocation. Mild degenerative changes of the first MTP. Possible small periarticular erosion at the base of the first TMT which may reflect underlying crystalline arthropathy. No unexpected radiopaque foreign body. Soft tissue irregularity of the heel and lateral fifth toe. Diffuse vascular calcifications. No subjacent cortical erosions or irregularity. Soft tissue edema. IMPRESSION: 1.  No radiographic evidence of osteomyelitis. 2. Possible small periarticular erosion at the base of the first TMT which may reflect underlying crystalline arthropathy. Electronically Signed   By: Meda Klinefelter M.D.   On: 02/27/2023 09:46   DG Chest 2 View  Result Date: 02/27/2023 CLINICAL DATA:  r/o sepsis EXAM: CHEST - 2 VIEW COMPARISON:  December 13, 2021 FINDINGS: The cardiomediastinal silhouette is unchanged and enlarged in contour. No significant pleural effusion. No pneumothorax. Perihilar vascular fullness, peribronchial cuffing and mild diffuse interstitial prominence. Visualized abdomen is unremarkable. Atherosclerotic calcifications. IMPRESSION: Constellation of findings are favored to reflect mild pulmonary edema. Electronically Signed   By: Meda Klinefelter M.D.   On: 02/27/2023 09:42    Pending Labs Unresulted Labs (From admission, onward)     Start     Ordered   02/28/23 0500  HIV Antibody (routine testing w rflx)   (HIV Antibody (Routine testing w reflex) panel)  Tomorrow morning,   R        02/27/23 1532   02/28/23 0500  Basic metabolic panel  Tomorrow morning,   R        02/27/23 1532   02/28/23 0500  CBC  Tomorrow morning,   R        02/27/23 1532            Vitals/Pain Today's Vitals   02/27/23 1000 02/27/23 1230 02/27/23 1302 02/27/23 1531  BP: 127/84 118/87  (!) 149/114  Pulse: 88 (!) 104  (!) 110  Resp: 17 (!) 29    Temp:   98 F (36.7 C)   TempSrc:   Oral   SpO2: 100% 100%  100%  Weight:      Height:      PainSc:        Isolation Precautions No active isolations  Medications Medications  enoxaparin (LOVENOX) injection 40 mg (has no administration in time range)  acetaminophen (TYLENOL) tablet 650 mg (has no administration in time range)    Or  acetaminophen (TYLENOL) suppository 650 mg (has no administration in time range)  senna-docusate (Senokot-S) tablet 1 tablet (has no administration in time range)    Mobility walks with device     Focused Assessments    R Recommendations: See Admitting Provider Note  Report given to:   Additional Notes:

## 2023-02-27 NOTE — Progress Notes (Signed)
Left lower extremity venous and arterial duplex studies completed.   Preliminary results relayed to RN.  Please see CV Procedures for preliminary results.  Maisen Schmit, RVT  10:50 AM 02/27/23

## 2023-02-28 ENCOUNTER — Inpatient Hospital Stay (HOSPITAL_COMMUNITY): Payer: Medicare Other

## 2023-02-28 ENCOUNTER — Observation Stay (HOSPITAL_COMMUNITY): Payer: Medicare Other

## 2023-02-28 ENCOUNTER — Encounter (HOSPITAL_COMMUNITY): Payer: Medicare Other

## 2023-02-28 ENCOUNTER — Encounter (HOSPITAL_COMMUNITY)
Admission: EM | Disposition: A | Discharge status: Home Health | Payer: Self-pay | Source: Home / Self Care | Attending: Infectious Diseases

## 2023-02-28 DIAGNOSIS — N1831 Chronic kidney disease, stage 3a: Secondary | ICD-10-CM | POA: Diagnosis present

## 2023-02-28 DIAGNOSIS — E11621 Type 2 diabetes mellitus with foot ulcer: Secondary | ICD-10-CM | POA: Diagnosis present

## 2023-02-28 DIAGNOSIS — I70244 Atherosclerosis of native arteries of left leg with ulceration of heel and midfoot: Secondary | ICD-10-CM

## 2023-02-28 DIAGNOSIS — B964 Proteus (mirabilis) (morganii) as the cause of diseases classified elsewhere: Secondary | ICD-10-CM | POA: Diagnosis present

## 2023-02-28 DIAGNOSIS — M86172 Other acute osteomyelitis, left ankle and foot: Secondary | ICD-10-CM | POA: Diagnosis present

## 2023-02-28 DIAGNOSIS — I272 Pulmonary hypertension, unspecified: Secondary | ICD-10-CM | POA: Diagnosis present

## 2023-02-28 DIAGNOSIS — Z7984 Long term (current) use of oral hypoglycemic drugs: Secondary | ICD-10-CM | POA: Diagnosis not present

## 2023-02-28 DIAGNOSIS — S91302A Unspecified open wound, left foot, initial encounter: Secondary | ICD-10-CM | POA: Diagnosis not present

## 2023-02-28 DIAGNOSIS — I70245 Atherosclerosis of native arteries of left leg with ulceration of other part of foot: Secondary | ICD-10-CM | POA: Diagnosis not present

## 2023-02-28 DIAGNOSIS — E1169 Type 2 diabetes mellitus with other specified complication: Secondary | ICD-10-CM | POA: Diagnosis present

## 2023-02-28 DIAGNOSIS — I2510 Atherosclerotic heart disease of native coronary artery without angina pectoris: Secondary | ICD-10-CM | POA: Diagnosis not present

## 2023-02-28 DIAGNOSIS — N179 Acute kidney failure, unspecified: Secondary | ICD-10-CM | POA: Diagnosis present

## 2023-02-28 DIAGNOSIS — I509 Heart failure, unspecified: Secondary | ICD-10-CM | POA: Diagnosis not present

## 2023-02-28 DIAGNOSIS — I110 Hypertensive heart disease with heart failure: Secondary | ICD-10-CM | POA: Diagnosis not present

## 2023-02-28 DIAGNOSIS — K7460 Unspecified cirrhosis of liver: Secondary | ICD-10-CM | POA: Diagnosis present

## 2023-02-28 DIAGNOSIS — I428 Other cardiomyopathies: Secondary | ICD-10-CM | POA: Diagnosis present

## 2023-02-28 DIAGNOSIS — M7989 Other specified soft tissue disorders: Secondary | ICD-10-CM | POA: Diagnosis not present

## 2023-02-28 DIAGNOSIS — I96 Gangrene, not elsewhere classified: Secondary | ICD-10-CM | POA: Diagnosis not present

## 2023-02-28 DIAGNOSIS — I70229 Atherosclerosis of native arteries of extremities with rest pain, unspecified extremity: Secondary | ICD-10-CM | POA: Diagnosis not present

## 2023-02-28 DIAGNOSIS — I70262 Atherosclerosis of native arteries of extremities with gangrene, left leg: Secondary | ICD-10-CM | POA: Diagnosis present

## 2023-02-28 DIAGNOSIS — E1140 Type 2 diabetes mellitus with diabetic neuropathy, unspecified: Secondary | ICD-10-CM | POA: Insufficient documentation

## 2023-02-28 DIAGNOSIS — E871 Hypo-osmolality and hyponatremia: Secondary | ICD-10-CM | POA: Diagnosis present

## 2023-02-28 DIAGNOSIS — E039 Hypothyroidism, unspecified: Secondary | ICD-10-CM | POA: Diagnosis present

## 2023-02-28 DIAGNOSIS — E1152 Type 2 diabetes mellitus with diabetic peripheral angiopathy with gangrene: Secondary | ICD-10-CM | POA: Diagnosis present

## 2023-02-28 DIAGNOSIS — I5022 Chronic systolic (congestive) heart failure: Secondary | ICD-10-CM | POA: Diagnosis present

## 2023-02-28 DIAGNOSIS — Z79899 Other long term (current) drug therapy: Secondary | ICD-10-CM | POA: Diagnosis not present

## 2023-02-28 DIAGNOSIS — E1159 Type 2 diabetes mellitus with other circulatory complications: Secondary | ICD-10-CM | POA: Diagnosis not present

## 2023-02-28 DIAGNOSIS — M869 Osteomyelitis, unspecified: Secondary | ICD-10-CM | POA: Diagnosis not present

## 2023-02-28 DIAGNOSIS — Z794 Long term (current) use of insulin: Secondary | ICD-10-CM | POA: Diagnosis not present

## 2023-02-28 DIAGNOSIS — E785 Hyperlipidemia, unspecified: Secondary | ICD-10-CM | POA: Diagnosis present

## 2023-02-28 DIAGNOSIS — R160 Hepatomegaly, not elsewhere classified: Secondary | ICD-10-CM | POA: Diagnosis present

## 2023-02-28 DIAGNOSIS — B952 Enterococcus as the cause of diseases classified elsewhere: Secondary | ICD-10-CM | POA: Diagnosis present

## 2023-02-28 DIAGNOSIS — I7090 Unspecified atherosclerosis: Secondary | ICD-10-CM

## 2023-02-28 DIAGNOSIS — R0989 Other specified symptoms and signs involving the circulatory and respiratory systems: Secondary | ICD-10-CM

## 2023-02-28 DIAGNOSIS — T82858A Stenosis of vascular prosthetic devices, implants and grafts, initial encounter: Secondary | ICD-10-CM | POA: Diagnosis not present

## 2023-02-28 DIAGNOSIS — L97428 Non-pressure chronic ulcer of left heel and midfoot with other specified severity: Secondary | ICD-10-CM | POA: Diagnosis not present

## 2023-02-28 DIAGNOSIS — L97429 Non-pressure chronic ulcer of left heel and midfoot with unspecified severity: Secondary | ICD-10-CM | POA: Diagnosis present

## 2023-02-28 DIAGNOSIS — Z87891 Personal history of nicotine dependence: Secondary | ICD-10-CM | POA: Diagnosis not present

## 2023-02-28 DIAGNOSIS — I130 Hypertensive heart and chronic kidney disease with heart failure and stage 1 through stage 4 chronic kidney disease, or unspecified chronic kidney disease: Secondary | ICD-10-CM | POA: Diagnosis present

## 2023-02-28 DIAGNOSIS — E1122 Type 2 diabetes mellitus with diabetic chronic kidney disease: Secondary | ICD-10-CM | POA: Diagnosis present

## 2023-02-28 DIAGNOSIS — M79672 Pain in left foot: Secondary | ICD-10-CM | POA: Diagnosis present

## 2023-02-28 DIAGNOSIS — T82898A Other specified complication of vascular prosthetic devices, implants and grafts, initial encounter: Secondary | ICD-10-CM | POA: Diagnosis not present

## 2023-02-28 DIAGNOSIS — I8510 Secondary esophageal varices without bleeding: Secondary | ICD-10-CM | POA: Diagnosis present

## 2023-02-28 HISTORY — PX: ABDOMINAL AORTOGRAM W/LOWER EXTREMITY: CATH118223

## 2023-02-28 LAB — GLUCOSE, CAPILLARY: Glucose-Capillary: 99 mg/dL (ref 70–99)

## 2023-02-28 LAB — CBC
HCT: 39.3 % (ref 39.0–52.0)
Hemoglobin: 12.8 g/dL — ABNORMAL LOW (ref 13.0–17.0)
MCH: 27.7 pg (ref 26.0–34.0)
MCHC: 32.6 g/dL (ref 30.0–36.0)
MCV: 85.1 fL (ref 80.0–100.0)
Platelets: 172 10*3/uL (ref 150–400)
RBC: 4.62 MIL/uL (ref 4.22–5.81)
RDW: 15 % (ref 11.5–15.5)
WBC: 6.3 10*3/uL (ref 4.0–10.5)
nRBC: 0 % (ref 0.0–0.2)

## 2023-02-28 LAB — LIPID PANEL
Cholesterol: 113 mg/dL (ref 0–200)
HDL: 39 mg/dL — ABNORMAL LOW (ref >40)
LDL Cholesterol: 55 mg/dL (ref 0–99)
Total CHOL/HDL Ratio: 2.9 RATIO
Triglycerides: 94 mg/dL (ref <150)
VLDL: 19 mg/dL (ref 0–40)

## 2023-02-28 LAB — BASIC METABOLIC PANEL
Anion gap: 9 (ref 5–15)
BUN: 24 mg/dL — ABNORMAL HIGH (ref 6–20)
CO2: 20 mmol/L — ABNORMAL LOW (ref 22–32)
Calcium: 8.9 mg/dL (ref 8.9–10.3)
Chloride: 105 mmol/L (ref 98–111)
Creatinine, Ser: 1.23 mg/dL (ref 0.61–1.24)
GFR, Estimated: >60 mL/min (ref >60)
Glucose, Bld: 94 mg/dL (ref 70–99)
Potassium: 4.2 mmol/L (ref 3.5–5.1)
Sodium: 134 mmol/L — ABNORMAL LOW (ref 135–145)

## 2023-02-28 LAB — POCT ACTIVATED CLOTTING TIME
Activated Clotting Time: 277 seconds
Activated Clotting Time: 174 seconds

## 2023-02-28 LAB — C-REACTIVE PROTEIN: CRP: 1.8 mg/dL — ABNORMAL HIGH (ref <1.0)

## 2023-02-28 LAB — SEDIMENTATION RATE: Sed Rate: 62 mm/hr — ABNORMAL HIGH (ref 0–16)

## 2023-02-28 LAB — HIV ANTIBODY (ROUTINE TESTING W REFLEX): HIV Screen 4th Generation wRfx: NONREACTIVE

## 2023-02-28 SURGERY — ABDOMINAL AORTOGRAM W/LOWER EXTREMITY
Anesthesia: LOCAL

## 2023-02-28 MED ORDER — SODIUM CHLORIDE 0.9% FLUSH: 3.0000 mL | INTRAVENOUS | Status: DC | PRN

## 2023-02-28 MED ORDER — ACETAMINOPHEN 325 MG PO TABS: 650.0000 mg | ORAL_TABLET | ORAL | Status: DC | PRN

## 2023-02-28 MED ORDER — FENTANYL CITRATE (PF) 100 MCG/2ML IJ SOLN
INTRAMUSCULAR | Status: AC
  Filled 2023-02-28: qty 2

## 2023-02-28 MED ORDER — LABETALOL HCL 5 MG/ML IV SOLN: 10.0000 mg | INTRAVENOUS | Status: DC | PRN

## 2023-02-28 MED ORDER — HYDRALAZINE HCL 20 MG/ML IJ SOLN: 5.0000 mg | INTRAMUSCULAR | Status: DC | PRN

## 2023-02-28 MED ORDER — HEPARIN SODIUM (PORCINE) 1000 UNIT/ML IJ SOLN
INTRAMUSCULAR | Status: AC
  Filled 2023-02-28: qty 10

## 2023-02-28 MED ORDER — LIDOCAINE HCL (PF) 1 % IJ SOLN
INTRAMUSCULAR | Status: DC | PRN
  Administered 2023-02-28: 10 mL

## 2023-02-28 MED ORDER — ONDANSETRON HCL 4 MG/2ML IJ SOLN: 4.0000 mg | Freq: Four times a day (QID) | INTRAMUSCULAR | Status: DC | PRN

## 2023-02-28 MED ORDER — GABAPENTIN 400 MG PO CAPS
400.0000 mg | ORAL_CAPSULE | Freq: Three times a day (TID) | ORAL | Status: DC
  Administered 2023-02-28 – 2023-03-03 (×11): 400 mg via ORAL
  Filled 2023-02-28 (×11): qty 1

## 2023-02-28 MED ORDER — LIDOCAINE HCL (PF) 1 % IJ SOLN
INTRAMUSCULAR | Status: AC
  Filled 2023-02-28: qty 30

## 2023-02-28 MED ORDER — IODIXANOL 320 MG/ML IV SOLN
INTRAVENOUS | Status: DC | PRN
  Administered 2023-02-28: 90 mL via INTRA_ARTERIAL

## 2023-02-28 MED ORDER — HEPARIN SODIUM (PORCINE) 1000 UNIT/ML IJ SOLN
INTRAMUSCULAR | Status: DC | PRN
  Administered 2023-02-28: 8000 [IU] via INTRAVENOUS

## 2023-02-28 MED ORDER — PROTAMINE SULFATE 10 MG/ML IV SOLN
INTRAVENOUS | Status: AC
  Filled 2023-02-28: qty 5

## 2023-02-28 MED ORDER — MIDAZOLAM HCL 2 MG/2ML IJ SOLN
INTRAMUSCULAR | Status: DC | PRN
  Administered 2023-02-28: 1 mg via INTRAVENOUS

## 2023-02-28 MED ORDER — PROTAMINE SULFATE 10 MG/ML IV SOLN
INTRAVENOUS | Status: DC | PRN
  Administered 2023-02-28: 15 mg via INTRAVENOUS
  Administered 2023-02-28: 5 mg via INTRAVENOUS

## 2023-02-28 MED ORDER — SODIUM CHLORIDE 0.9% FLUSH
3.0000 mL | Freq: Two times a day (BID) | INTRAVENOUS | Status: DC
  Administered 2023-02-28 – 2023-03-04 (×9): 3 mL via INTRAVENOUS

## 2023-02-28 MED ORDER — MIDAZOLAM HCL 2 MG/2ML IJ SOLN
INTRAMUSCULAR | Status: AC
  Filled 2023-02-28: qty 2

## 2023-02-28 MED ORDER — FENTANYL CITRATE (PF) 100 MCG/2ML IJ SOLN
INTRAMUSCULAR | Status: DC | PRN
  Administered 2023-02-28: 25 ug via INTRAVENOUS

## 2023-02-28 MED ORDER — SODIUM CHLORIDE 0.9 % IV SOLN: INTRAVENOUS | Status: DC

## 2023-02-28 MED ORDER — SODIUM CHLORIDE 0.9 % IV SOLN: 250.0000 mL | INTRAVENOUS | Status: DC | PRN

## 2023-02-28 SURGICAL SUPPLY — 14 items
CATH OMNI FLUSH 5F 65CM (CATHETERS) IMPLANT
GLIDEWIRE ADV .035X260CM (WIRE) IMPLANT
KIT ENCORE 26 ADVANTAGE (KITS) IMPLANT
KIT MICROPUNCTURE NIT STIFF (SHEATH) IMPLANT
KIT PV (KITS) ×1 IMPLANT
SHEATH CATAPULT 7F 45 MP (SHEATH) IMPLANT
SHEATH PINNACLE 5F 10CM (SHEATH) IMPLANT
SHEATH PINNACLE 7F 10CM (SHEATH) IMPLANT
STENT VIABAHNBX 8X39X135 (Permanent Stent) IMPLANT
STOPCOCK MORSE 400PSI 3WAY (MISCELLANEOUS) IMPLANT
SYR MEDRAD MARK 7 150ML (SYRINGE) ×1 IMPLANT
TRANSDUCER W/STOPCOCK (MISCELLANEOUS) ×1 IMPLANT
TRAY PV CATH (CUSTOM PROCEDURE TRAY) ×1 IMPLANT
WIRE STARTER BENTSON 035X150 (WIRE) IMPLANT

## 2023-02-28 NOTE — Progress Notes (Signed)
Handoff report at 0745. Received patient with no IV access. Patient left unit for cath lab prior to order for IV team.

## 2023-02-28 NOTE — Progress Notes (Signed)
Site area: rt groin Site Prior to Removal:  Level 0 Pressure Applied For: 35 minutes Manual:   yes Patient Status During Pull:  stable  Post Pull Site:  Level 0 Post Pull Instructions Given:  yes Post Pull Pulses Present: rt dp dopplered Dressing Applied:  gauze and tegaderm Bedrest begins @ 1340 Comments:

## 2023-02-28 NOTE — Progress Notes (Signed)
Bilateral carotid ultrasound and bilateral lower extremity vein mapping studies completed.   Please see CV Procedures for preliminary results.  Zarina Pe, RVT  4:32 PM 02/28/23

## 2023-02-28 NOTE — Progress Notes (Signed)
Subjective:  Patient experienced neuropathic pain in his feet overnight with no relief from gabapentin. Overnight team ordered oxycodone 5 mg Q6 PRN which provided some relief. Patient reports continued pain in his feet at rest this morning. He states it is difficult for him to rest in bed due to increased pressure on the left heel wound, so he prefers sitting on the edge of bed with legs hanging off. Patient was also upset because someone accidentally stepped on his left foot during the night which caused him pain.  Objective:  Vital signs in last 24 hours: Vitals:   02/28/23 0018 02/28/23 0528 02/28/23 0848 02/28/23 1109  BP: 101/72 119/85 (!) 145/134   Pulse: 98 (!) 103 99   Resp: 17 17 15    Temp: 98 F (36.7 C) 97.8 F (36.6 C) 98.1 F (36.7 C)   TempSrc: Oral Oral Oral   SpO2: 99% 99% 100% 100%  Weight:      Height:       Physical Exam: Consitutional: Middle-aged male sitting on edge of bed with legs dangling. In no acute distress. CV: Regular rate, regular rhythm. 2/6 systolic murmur best appreciated at the left upper sternal border. Left lower extremity swelling without pitting. No swelling of the RLE.  Pulmonary: Normal respiratory effort. No wheezes, rales, rhonchi, or crackles.  Neurological: Alert and oriented to person, place, and time.  Skin: Warm and dry. Wound on left heel unchanged from yesterday. No erythema, swelling, or active drainage or bleeding. Ulceration on distal end of left second toe. BLE chronic dry skin changes.  Assessment/Plan:  Principal Problem:   Critical lower limb ischemia (HCC) Active Problems:   Type 2 diabetes mellitus with hyperlipidemia (HCC)   PAD (peripheral artery disease) (HCC)   Stage 3a chronic kidney disease (CKD) (HCC)   Open wound of left foot  Edward Rocha. Is a 57 y.o. male living with PAD, HTN, T2DM, CKD stage 3a, CHF, hypothyroidism, and cirrhosis who presents to the ED with foot pain and left heel wound and admitted for  peripheral artery disease and management of left heel wound.   # PAD # Left heel wound Patient with continued rest pain this morning. Gangrenous wounds on left heel and distal end of left second toe unchanged from yesterday. Vascular surgery planning for abdominal aortogram with lower extremity to assess options for revascularization. We will continue medical therapy with aspirin and atorvastatin. Lipid panel with LDL 55, HDL 39, and triglycerides 94. Will escalate medical therapy by adding Xarelto 2.5 mg twice daily. Since he is at risk for osteomyelitis of the calcaneus, ESR and CRP also ordered. If elevated, will obtain MRI of the left foot. - Appreciate vascular surgery recommendations - Abdominal aortogram today - Start Xarelto 2.5 mg twice daily - ESR and CRP pending - Continue home aspirin 81 mg daily - Continue home atorvastatin 40 mg daily  # Non-ischemic cardiomyopathy # Pulmonary hypertension # HFrEF, EF 20-25% # CAD HFrEF stable with no signs of volume overload on exam today. Will continue home metoprolol, Entresto, spironolactone, and [place]. Lasix held today given angiography today with risk for contrast-induced nephropathy. - Metoprolol succinate 50 mg daily - Entresto 24-26 mg BID - Spironolactone 12.5 mg daily  - Atorvastatin 40 mg - Aspirin 81 mg  - Farxiga 10 mg   # Type 2 Diabetes A1c 6.5 today. Diabetic neuropathy pain in his feet overnight with no relief from gabapentin. Will increase to gabapentin 400 mg TID PRN. Will keep current  order for oxycodone 5 mg Q6 PRN given imaging/procedures today but plan to wean off in coming days as alternative medications for pain control are optimized.  - Continue home Farxiga 10 mg daily - Gabapentin 400 mg TID PRN - Oxycodone 5 mg Q6 PRN  # CKD stage 3a Renal function at baseline with creatinine of 1.23. Patient at risk for contrast-induced nephropathy from angiography today. Lasix held today. Will continue monitoring renal  function.  - Trend BMP   # Hypertension BP stable with MAPs in the 80-90s overnight. Will continue home Entresto and metoprolol. - Entresto 24-26 mg BID - Metoprolol succinate 50 mg daily    Diet:  HH/CM Bowel: Senna VTE: Lovenox IVF: none Code: Full PT/OT recs: none LOS: day 1   Prior to Admission Living Arrangement: home with self Anticipated Discharge Location: TBD Barriers to Discharge: continued management   LOS: 0 days   Edward Rocha, Medical Student 02/28/2023, 11:41 AM

## 2023-02-28 NOTE — Progress Notes (Signed)
  Progress Note    02/28/2023 8:04 AM  Subjective:  no complaints. Sleep with L foot hanging off of bed due to rest pain   Vitals:   02/28/23 0018 02/28/23 0528  BP: 101/72 119/85  Pulse: 98 (!) 103  Resp: 17 17  Temp: 98 F (36.7 C) 97.8 F (36.6 C)  SpO2: 99% 99%   Physical Exam: Lungs:  non labored Extremities:  L ankle and toe gangrene Neurologic: A&O  CBC    Component Value Date/Time   WBC 6.3 02/28/2023 0647   RBC 4.62 02/28/2023 0647   HGB 12.8 (L) 02/28/2023 0647   HCT 39.3 02/28/2023 0647   PLT 172 02/28/2023 0647   MCV 85.1 02/28/2023 0647   MCH 27.7 02/28/2023 0647   MCHC 32.6 02/28/2023 0647   RDW 15.0 02/28/2023 0647   LYMPHSABS 1.1 02/27/2023 0858   MONOABS 0.6 02/27/2023 0858   EOSABS 0.1 02/27/2023 0858   BASOSABS 0.0 02/27/2023 0858    BMET    Component Value Date/Time   NA 134 (L) 02/28/2023 0647   K 4.2 02/28/2023 0647   CL 105 02/28/2023 0647   CO2 20 (L) 02/28/2023 0647   GLUCOSE 94 02/28/2023 0647   BUN 24 (H) 02/28/2023 0647   CREATININE 1.23 02/28/2023 0647   CALCIUM 8.9 02/28/2023 0647   GFRNONAA >60 02/28/2023 0647   GFRAA 54 (L) 10/19/2019 0616    INR    Component Value Date/Time   INR 1.2 12/12/2021 0015    No intake or output data in the 24 hours ending 02/28/23 0804   Assessment/Plan:  57 y.o. male with gangrene of L ankle and toe  Plan is for LLE arteriogram via L CFA or L brachial access today with Dr. Karin Lieu due to critical limb ischemia of LLE.  Case was discussed with the patient and all questions were answered.  Continue NPO.  Consent ordered.    Emilie Rutter, PA-C [facility] [phone] 02/28/2023 8:04 AM

## 2023-02-28 NOTE — Op Note (Signed)
Patient name: Edward Rocha. MRN: [id number] DOB: 1966-05-30 Sex: male  02/28/2023 Pre-operative Diagnosis: Critical limb ischemia of the left lower extremity with tissue loss (heel wound and 2nd toe wound) Post-operative diagnosis:  Same Surgeon:  Cephus Shelling, MD Procedure Performed: 1.  Ultrasound-guided access right common femoral artery 2.  Aortogram with catheter selection of aorta 3.  Left lower extremity arteriogram with selection of third order branches 4.  Left common iliac artery angioplasty with stent placement (8 mm x 39 mm VBX) 5.  36 minutes of monitored moderate conscious sedation time  Indications: Patient is a 57 year old male well-known to the vascular surgery service that previously underwent left iliac artery stenting with a left common femoral to posterior tibial artery bypass with composite for tissue loss.  Has not been seen since 2020.  In the interim he had revascularization in the right leg at [facility].  He now presents with left lower extremity tissue loss with an occluded left leg bypass.  He presents for aortogram, lower extremity arteriogram, with a focus on the left leg after risks and benefits discussed.  Findings:   Aortogram showed both renal arteries are patent.  The infra-renal aorta is patent.  The left common iliac artery stent was patent but the distal common iliac artery beyond the existing stent had high-grade calcified stenosis over 90%.  The left hypogastric was patent but severely diseased with a high-grade stenosis at the ositum.  The left external iliac artery stent was widely patent.  The left common iliac high-grade stenosis was then treated with a 8 mm x 39 mm VBX with excellent results.  Left lower extremity runoff showed a very diseased common femoral with a high-grade stenosis in the proximal profunda.  The SFA is diffusely diseased.  This occludes above the knee in the popliteal artery.  His below-knee popliteal artery is occluded but  does reconstitute retrograde.  Dominant runoff in the foot is through the posterior tibial and peroneal.  Trifurcation appears occluded.   Procedure:  The patient was identified in the holding area and taken to room 7.  The patient was then placed supine on the table and prepped and draped in the usual sterile fashion.  A time out was called.  The patient received Versed and fentanyl for conscious moderate sedation.  Vital signs were monitored including heart rate, respiratory rate, oxygenation and blood pressure.  I was present for all of moderate sedation.  Ultrasound was used to evaluate the right common femoral artery.  It was patent .  A digital ultrasound image was acquired.  A micropuncture needle was used to access the right common femoral artery under ultrasound guidance.  An 018 wire was advanced without resistance and a micropuncture sheath was placed.  The 018 wire was removed and a benson wire was placed.  The micropuncture sheath was exchanged for a 5 french sheath.  An omniflush catheter was advanced over the wire to the level of L-1.  An abdominal angiogram was obtained.  We then got some dedicated iliac imaging.  I elected to treat the left common iliac artery high-grade calcified stenosis.  I used a Glidewire advantage with a Omni Flush catheter to cross the aortic bifurcation and got my wire into the left SFA.  I upsized to a long 7 [place] Cat[place] sheath in the right groin over the aortic bifurcation.  Patient was given 100 units/kg IV heparin.  We then got a planning shot through hand-injection and then selected  a 8 mm x 39 mm VBX deployed in the distal common iliac artery while preserving the hypogastric.  Excellent results.  No significant residual stenosis.  The hypogastric was preserved.  We then used our sheath to get left lower extremity runoff with pertinent findings noted above.  Patient will need left common femoral endarterectomy with profundoplasty and a redo fem-tib bypass.  Wires  and catheters were removed after he put a short 7 French sheath in the right groin.   Plan: Good results after left common iliac artery stenting today for high-grade stenosis, but multi-level disease.  Needs a common femoral endarterectomy with profundoplasty and redo femoral to tibial bypass in the left leg.  This will be scheduled for Monday with me in the OR.  Cephus Shelling, MD [facility] [place] Office: [phone]

## 2023-03-01 ENCOUNTER — Encounter (HOSPITAL_COMMUNITY): Payer: Self-pay | Admitting: Vascular Surgery

## 2023-03-01 DIAGNOSIS — I70229 Atherosclerosis of native arteries of extremities with rest pain, unspecified extremity: Secondary | ICD-10-CM | POA: Diagnosis not present

## 2023-03-01 DIAGNOSIS — S91302A Unspecified open wound, left foot, initial encounter: Secondary | ICD-10-CM | POA: Diagnosis not present

## 2023-03-01 LAB — CBC
HCT: 38.8 % — ABNORMAL LOW (ref 39.0–52.0)
Hemoglobin: 12.6 g/dL — ABNORMAL LOW (ref 13.0–17.0)
MCH: 28 pg (ref 26.0–34.0)
MCHC: 32.5 g/dL (ref 30.0–36.0)
MCV: 86.2 fL (ref 80.0–100.0)
Platelets: 167 10*3/uL (ref 150–400)
RBC: 4.5 MIL/uL (ref 4.22–5.81)
RDW: 15 % (ref 11.5–15.5)
WBC: 7.6 10*3/uL (ref 4.0–10.5)
nRBC: 0 % (ref 0.0–0.2)

## 2023-03-01 LAB — BASIC METABOLIC PANEL
Anion gap: 9 (ref 5–15)
BUN: 22 mg/dL — ABNORMAL HIGH (ref 6–20)
CO2: 18 mmol/L — ABNORMAL LOW (ref 22–32)
Calcium: 8.7 mg/dL — ABNORMAL LOW (ref 8.9–10.3)
Chloride: 105 mmol/L (ref 98–111)
Creatinine, Ser: 1.1 mg/dL (ref 0.61–1.24)
GFR, Estimated: >60 mL/min (ref >60)
Glucose, Bld: 93 mg/dL (ref 70–99)
Potassium: 4 mmol/L (ref 3.5–5.1)
Sodium: 132 mmol/L — ABNORMAL LOW (ref 135–145)

## 2023-03-01 MED ORDER — RIVAROXABAN 2.5 MG PO TABS
2.5000 mg | ORAL_TABLET | Freq: Two times a day (BID) | ORAL | Status: DC
  Administered 2023-03-01 – 2023-03-02 (×3): 2.5 mg via ORAL
  Filled 2023-03-01 (×4): qty 1

## 2023-03-01 NOTE — Evaluation (Signed)
Physical Therapy Evaluation Patient Details Name: Edward Rocha. MRN: [id number] DOB: 30-Dec-1965 Today's Date: 03/01/2023  History of Present Illness  Pt is a 57 year old male admitted on 02/27/23 with foot pain and left heel wound. PMH significiant for CAD, PAD, HTN, T2DM, CKD3a, HFrEF, hypothyroidism, and cirrhosis.  Clinical Impression  Pt presents with admitting diagnosis above. Pt was able to ambulate in hallway with RW at Mod I level. Pt appears to be at his baseline however has surgery scheduled on Monday so PT will follow up after surgery for 1 additional visit then likely DC. Anticipate that pt will have no post acute PT needs.       Recommendations for follow up therapy are one component of a multi-disciplinary discharge planning process, led by the attending physician.  Recommendations may be updated based on patient status, additional functional criteria and insurance authorization.  Follow Up Recommendations       Assistance Recommended at Discharge PRN  Patient can return home with the following  A little help with walking and/or transfers;A little help with bathing/dressing/bathroom;Assistance with cooking/housework;Direct supervision/assist for medications management;Assist for transportation;Help with stairs or ramp for entrance    Equipment Recommendations None recommended by PT  Recommendations for Other Services       Functional Status Assessment Patient has had a recent decline in their functional status and demonstrates the ability to make significant improvements in function in a reasonable and predictable amount of time.     Precautions / Restrictions Precautions Precautions: Fall Restrictions Weight Bearing Restrictions: No      Mobility  Bed Mobility               General bed mobility comments: Pt seated EOB    Transfers Overall transfer level: Independent Equipment used: Rolling walker (2 wheels)                     Ambulation/Gait Ambulation/Gait assistance: Independent Gait Distance (Feet): 300 Feet Assistive device: Rolling walker (2 wheels) Gait Pattern/deviations: Decreased step length - left, Decreased stance time - left, Decreased stride length, Step-through pattern Gait velocity: decreased     General Gait Details: no LOB noted  Stairs            Wheelchair Mobility    Modified Rankin (Stroke Patients Only)       Balance Overall balance assessment: No apparent balance deficits (not formally assessed)                                           Pertinent Vitals/Pain Pain Assessment Pain Assessment: 0-10 Pain Score: 7  Pain Location: B Feet. L foot worse than R Pain Descriptors / Indicators: Discomfort, Grimacing, Constant, Sore Pain Intervention(s): Monitored during session, Premedicated before session    Home Living Family/patient expects to be discharged to:: Private residence Living Arrangements: Alone Available Help at Discharge: Neighbor;Family;Available PRN/intermittently Type of Home: House Home Access: Stairs to enter Entrance Stairs-Rails: None Entrance Stairs-Number of Steps: 1   Home Layout: One level Home Equipment: Agricultural consultant (2 wheels);Cane - single point;Hand held shower head;Wheelchair - Engineer, technical sales - power      Prior Function Prior Level of Function : Independent/Modified Independent;Driving             Mobility Comments: Mod I RW ADLs Comments: Ind     Hand Dominance   Dominant Hand: Right  Extremity/Trunk Assessment   Upper Extremity Assessment Upper Extremity Assessment: Overall WFL for tasks assessed    Lower Extremity Assessment Lower Extremity Assessment: Overall WFL for tasks assessed    Cervical / Trunk Assessment Cervical / Trunk Assessment: Normal  Communication   Communication: No difficulties  Cognition Arousal/Alertness: Awake/alert Behavior During Therapy: WFL for tasks  assessed/performed Overall Cognitive Status: Within Functional Limits for tasks assessed                                          General Comments General comments (skin integrity, edema, etc.): VSS on RA    Exercises     Assessment/Plan    PT Assessment Patient needs continued PT services  PT Problem List Decreased activity tolerance;Decreased mobility;Pain;Decreased skin integrity       PT Treatment Interventions Gait training;Stair training;Functional mobility training;Therapeutic activities;Therapeutic exercise;Neuromuscular re-education;Patient/family education    PT Goals (Current goals can be found in the Care Plan section)  Acute Rehab PT Goals Patient Stated Goal: to go home PT Goal Formulation: With patient Time For Goal Achievement: 03/15/23 Potential to Achieve Goals: Good    Frequency Min 1X/week     Co-evaluation               AM-PAC PT "6 Clicks" Mobility  Outcome Measure Help needed turning from your back to your side while in a flat bed without using bedrails?: None Help needed moving from lying on your back to sitting on the side of a flat bed without using bedrails?: None Help needed moving to and from a bed to a chair (including a wheelchair)?: None Help needed standing up from a chair using your arms (e.g., wheelchair or bedside chair)?: None Help needed to walk in hospital room?: None Help needed climbing 3-5 steps with a railing? : A Little 6 Click Score: 23    End of Session Equipment Utilized During Treatment: Gait belt Activity Tolerance: Patient tolerated treatment well Patient left: in bed;with call bell/phone within reach Nurse Communication: Mobility status PT Visit Diagnosis: Other abnormalities of gait and mobility (R26.89);Pain Pain - Right/Left: Left Pain - part of body: Ankle and joints of foot    Time: 1418-1440 PT Time Calculation (min) (ACUTE ONLY): 22 min   Charges:   PT Evaluation $PT Eval Low  Complexity: 1 Low PT Treatments $Gait Training: 8-22 mins        Edward Rocha, PT, DPT Acute Rehab Services [phone]   Edward Rocha 03/01/2023, 4:16 PM

## 2023-03-01 NOTE — Progress Notes (Addendum)
  Progress Note    03/01/2023 7:33 AM 1 Day Post-Op  Subjective:  feeling alright, some pain in the left heel    Vitals:   02/28/23 2315 03/01/23 0305  BP: 105/72 116/84  Pulse: 87 98  Resp: 18 20  Temp: 98.3 F (36.8 C) 98.2 F (36.8 C)  SpO2: 99% 99%    Physical Exam: General:  no acute distress Cardiac:  regular Lungs:  nonlabored Incisions:  right groin cath site soft without hematoma Extremities:  Unchanged appearance of left 2nd toe and heel wounds. Left PT/Peroneal doppler signals  CBC    Component Value Date/Time   WBC 7.6 03/01/2023 0153   RBC 4.50 03/01/2023 0153   HGB 12.6 (L) 03/01/2023 0153   HCT 38.8 (L) 03/01/2023 0153   PLT 167 03/01/2023 0153   MCV 86.2 03/01/2023 0153   MCH 28.0 03/01/2023 0153   MCHC 32.5 03/01/2023 0153   RDW 15.0 03/01/2023 0153   LYMPHSABS 1.1 02/27/2023 0858   MONOABS 0.6 02/27/2023 0858   EOSABS 0.1 02/27/2023 0858   BASOSABS 0.0 02/27/2023 0858    BMET    Component Value Date/Time   NA 132 (L) 03/01/2023 0153   K 4.0 03/01/2023 0153   CL 105 03/01/2023 0153   CO2 18 (L) 03/01/2023 0153   GLUCOSE 93 03/01/2023 0153   BUN 22 (H) 03/01/2023 0153   CREATININE 1.10 03/01/2023 0153   CALCIUM 8.7 (L) 03/01/2023 0153   GFRNONAA >60 03/01/2023 0153   GFRAA 54 (L) 10/19/2019 0616    INR    Component Value Date/Time   INR 1.2 12/12/2021 0015     Intake/Output Summary (Last 24 hours) at 03/01/2023 0733 Last data filed at 02/28/2023 2300 Gross per 24 hour  Intake 368.33 ml  Output 300 ml  Net 68.33 ml      Assessment/Plan:  57 y.o. male is 1 day post op, s/p: LLE angiogram with left common iliac angioplasty and stenting   -Successful treatment of left iliac stenosis yesterday in the cath lab. Right groin cath site is soft without hematoma -The patient has multilevel disease in the left leg and in hopes to heal his left foot wounds, he will require more surgery. Current plans are for left common femoral  endarterectomy with profundoplasty and redo fem-tib bypass on Monday with Dr.Clark. The patient is agreeable to this plan -Currently has okay left PT/Peroneal doppler signals. Wounds of the left 2nd toe and heel are dry. Bandage on the left heel wound -Continue ASA and statin. Will have to hold Xarelto starting tomorrow   Edward Rocha, [place] [facility] [phone] 03/01/2023 7:33 AM  VASCULAR STAFF ADDENDUM: I agree with the above.  Npo Sunday. Will see Sunday to ensure all questions and concerns are addressed.   Edward Olden, MD [facility] Phone Number: [phone] 03/01/2023 4:43 PM

## 2023-03-01 NOTE — Progress Notes (Signed)
Subjective:  No significant overnight events. Patient feeling "alright" this morning after multiple imaging studies and angiogram yesterday. He reports continued rest pain, but bandage placed on left heel wound has allowed him to rest his legs in the bed some. He denies pain at the right groin cath site.  Objective:  Vital signs in last 24 hours: Vitals:   02/28/23 2200 02/28/23 2315 03/01/23 0305 03/01/23 0827  BP:  105/72 116/84 108/78  Pulse:  87 98 82  Resp: 16 18 20 18   Temp:  98.3 F (36.8 C) 98.2 F (36.8 C) 97.6 F (36.4 C)  TempSrc:  Oral Oral Oral  SpO2:  99% 99% 97%  Weight:      Height:        Physical Exam: Consitutional: Middle-aged male lying in bed with left leg hanging off the side. Appears comfortable and in no acute distress. CV: Regular rate, regular rhythm. 2/6 systolic murmur best appreciated at the left upper sternal border. Left lower extremity swelling without pitting. No swelling of the RLE.  Pulmonary: Normal respiratory effort. No wheezes, rales, rhonchi, or crackles.  Neurological: Alert and oriented to person, place, and time.  Skin: Warm and dry. Bandage on left heel wound. Ulceration on distal end of left second toe unchanged. BLE chronic dry skin changes.  Assessment/Plan:  Principal Problem:   Critical lower limb ischemia (HCC) Active Problems:   Type 2 diabetes mellitus with hyperlipidemia (HCC)   PAD (peripheral artery disease) (HCC)   Stage 3a chronic kidney disease (CKD) (HCC)   Open wound of left foot   Edward Rocha. Is a 57 y.o. male living with PAD, HTN, T2DM, CKD stage 3a, CHF, hypothyroidism, and cirrhosis who presented to the ED with foot pain and left heel wound and admitted for peripheral artery disease and management of left heel wound.   # PAD # Left heel wound Patient continues to have rest pain this morning. He underwent angioplasty and stent placement to diseased left common iliac artery yesterday with vascular  surgery. He is scheduled for common femoral endarterectomy with redo of femoral to tibial bypass on Monday. Spoke with vascular surgery who is considering amputation of left second toe given that it is gangrenous. However, this would potentially occur after his procedure on Monday. MRI of left foot shows subcortical marrow edema in the posterior calcaneus favored to be reactive marrow change vs. early osteomyelitis. Regarding bone biopsy, this would have to be performed by podiatry vs ortho. Will reach out to one of the services after his procedure to discuss bone biopsy. We will continue medical management with aspirin, atorvastatin, and Xarelto. Will have to hold Xarelto for 48 hours before surgery.  - Appreciate vascular surgery recommendations - Xarelto 2.5 mg twice daily. Hold starting Saturday AM.  - Continue home aspirin 81 mg daily - Continue home atorvastatin 40 mg daily   # Non-ischemic cardiomyopathy # Pulmonary hypertension # HFrEF, EF 20-25% # CAD Patient was given IV fluids per vascular surgery following aortogram yesterday. No signs of volume overload today. BP normotensive. Will discontinue fluids at this time. Will also continue home metoprolol, Entresto, spironolactone, and [place]. Lasix held today given risk for contrast-induced nephropathy after angiogram yesterday.  - Metoprolol succinate 50 mg daily - Entresto 24-26 mg BID - Spironolactone 12.5 mg daily  - Atorvastatin 40 mg - Aspirin 81 mg  - Farxiga 10 mg    # Type 2 Diabetes Patient reports increased gabapentin dose of 400 mg TID did  not help with his neuropathic pain. Will continue current dose and consider increasing gabapentin dose if he experiences worsening pain. - Continue home Farxiga 10 mg daily - Gabapentin 400 mg TID - Oxycodone 5 mg Q6 PRN   # CKD stage 3a Renal function at baseline with creatinine of 1.10. Patient at risk for contrast-induced nephropathy from angiogram yesterday. Lasix held today. Will  continue monitoring renal function. - Trend BMP   # Hypertension BP stable overnight with MAPs in the 80-90s. PRN hydralazine and labetalol ordered per vascular surgery. No doses have been needed so discontinued for now. Will continue home Entresto and metoprolol.  - Entresto 24-26 mg BID - Metoprolol succinate 50 mg daily    Diet:  HH/CM Bowel: Senna VTE: Xarelto and aspirin IVF: None.  Code: Full PT/OT recs: none LOS: day 2   Prior to Admission Living Arrangement: home with self Anticipated Discharge Location: TBD Barriers to Discharge: continued management   LOS: 1 day   Edward Rocha, Medical Student 03/01/2023, 10:04 AM

## 2023-03-01 NOTE — Plan of Care (Signed)

## 2023-03-01 NOTE — Plan of Care (Signed)
  Problem: Education: Goal: Knowledge of General Education information will improve Description: Including pain rating scale, medication(s)/side effects and non-pharmacologic comfort measures 03/01/2023 0018 by Orson Ape, RN Outcome: Progressing 03/01/2023 0018 by Orson Ape, RN Outcome: Progressing   Problem: Health Behavior/Discharge Planning: Goal: Ability to manage health-related needs will improve 03/01/2023 0018 by Orson Ape, RN Outcome: Progressing 03/01/2023 0018 by Orson Ape, RN Outcome: Progressing   Problem: Clinical Measurements: Goal: Ability to maintain clinical measurements within normal limits will improve 03/01/2023 0018 by Orson Ape, RN Outcome: Progressing 03/01/2023 0018 by Orson Ape, RN Outcome: Progressing Goal: Will remain free from infection 03/01/2023 0018 by Orson Ape, RN Outcome: Progressing 03/01/2023 0018 by Orson Ape, RN Outcome: Progressing Goal: Diagnostic test results will improve 03/01/2023 0018 by Orson Ape, RN Outcome: Progressing 03/01/2023 0018 by Orson Ape, RN Outcome: Progressing Goal: Respiratory complications will improve 03/01/2023 0018 by Orson Ape, RN Outcome: Progressing 03/01/2023 0018 by Orson Ape, RN Outcome: Progressing Goal: Cardiovascular complication will be avoided 03/01/2023 0018 by Orson Ape, RN Outcome: Progressing 03/01/2023 0018 by Orson Ape, RN Outcome: Progressing   Problem: Activity: Goal: Risk for activity intolerance will decrease 03/01/2023 0018 by Orson Ape, RN Outcome: Progressing 03/01/2023 0018 by Orson Ape, RN Outcome: Progressing   Problem: Nutrition: Goal: Adequate nutrition will be maintained 03/01/2023 0018 by Orson Ape, RN Outcome: Progressing 03/01/2023 0018 by Orson Ape, RN Outcome: Progressing   Problem: Coping: Goal: Level of anxiety will decrease 03/01/2023 0018 by Orson Ape, RN Outcome: Progressing 03/01/2023 0018 by Orson Ape, RN Outcome: Progressing   Problem: Elimination: Goal: Will not experience complications related to bowel motility 03/01/2023 0018 by Orson Ape, RN Outcome: Progressing 03/01/2023 0018 by Orson Ape, RN Outcome: Progressing Goal: Will not experience complications related to urinary retention 03/01/2023 0018 by Orson Ape, RN Outcome: Progressing 03/01/2023 0018 by Orson Ape, RN Outcome: Progressing   Problem: Pain Managment: Goal: General experience of comfort will improve 03/01/2023 0018 by Orson Ape, RN Outcome: Progressing 03/01/2023 0018 by Orson Ape, RN Outcome: Progressing   Problem: Safety: Goal: Ability to remain free from injury will improve 03/01/2023 0018 by Orson Ape, RN Outcome: Progressing 03/01/2023 0018 by Orson Ape, RN Outcome: Progressing   Problem: Skin Integrity: Goal: Risk for impaired skin integrity will decrease 03/01/2023 0018 by Orson Ape, RN Outcome: Progressing 03/01/2023 0018 by Orson Ape, RN Outcome: Progressing   Problem: Education: Goal: Understanding of CV disease, CV risk reduction, and recovery process will improve 03/01/2023 0018 by Orson Ape, RN Outcome: Progressing 03/01/2023 0018 by Orson Ape, RN Outcome: Progressing Goal: Individualized Educational Video(s) 03/01/2023 0018 by Orson Ape, RN Outcome: Progressing 03/01/2023 0018 by Orson Ape, RN Outcome: Progressing   Problem: Activity: Goal: Ability to return to baseline activity level will improve 03/01/2023 0018 by Orson Ape, RN Outcome: Progressing 03/01/2023 0018 by Orson Ape, RN Outcome: Progressing   Problem: Cardiovascular: Goal: Ability to achieve and maintain adequate cardiovascular perfusion will improve 03/01/2023 0018 by Orson Ape, RN Outcome: Progressing 03/01/2023 0018 by Orson Ape,  RN Outcome: Progressing Goal: Vascular access site(s) Level 0-1 will be maintained 03/01/2023 0018 by Orson Ape, RN Outcome: Progressing 03/01/2023 0018 by Orson Ape, RN Outcome: Progressing   Problem: Health Behavior/Discharge Planning: Goal: Ability to safely manage health-related needs after discharge will improve 03/01/2023 0018 by Orson Ape, RN Outcome: Progressing 03/01/2023 0018 by Orson Ape, RN Outcome: Progressing

## 2023-03-02 DIAGNOSIS — Z87891 Personal history of nicotine dependence: Secondary | ICD-10-CM

## 2023-03-02 DIAGNOSIS — I5030 Unspecified diastolic (congestive) heart failure: Secondary | ICD-10-CM

## 2023-03-02 DIAGNOSIS — N1831 Chronic kidney disease, stage 3a: Secondary | ICD-10-CM

## 2023-03-02 DIAGNOSIS — I130 Hypertensive heart and chronic kidney disease with heart failure and stage 1 through stage 4 chronic kidney disease, or unspecified chronic kidney disease: Secondary | ICD-10-CM | POA: Diagnosis not present

## 2023-03-02 DIAGNOSIS — I272 Pulmonary hypertension, unspecified: Secondary | ICD-10-CM | POA: Diagnosis not present

## 2023-03-02 DIAGNOSIS — I70229 Atherosclerosis of native arteries of extremities with rest pain, unspecified extremity: Secondary | ICD-10-CM | POA: Diagnosis not present

## 2023-03-02 DIAGNOSIS — I428 Other cardiomyopathies: Secondary | ICD-10-CM | POA: Diagnosis not present

## 2023-03-02 DIAGNOSIS — I2510 Atherosclerotic heart disease of native coronary artery without angina pectoris: Secondary | ICD-10-CM

## 2023-03-02 DIAGNOSIS — E1122 Type 2 diabetes mellitus with diabetic chronic kidney disease: Secondary | ICD-10-CM

## 2023-03-02 LAB — CBC
HCT: 38 % — ABNORMAL LOW (ref 39.0–52.0)
Hemoglobin: 12.5 g/dL — ABNORMAL LOW (ref 13.0–17.0)
MCH: 27.8 pg (ref 26.0–34.0)
MCHC: 32.9 g/dL (ref 30.0–36.0)
MCV: 84.4 fL (ref 80.0–100.0)
Platelets: 164 10*3/uL (ref 150–400)
RBC: 4.5 MIL/uL (ref 4.22–5.81)
RDW: 14.7 % (ref 11.5–15.5)
WBC: 7.7 10*3/uL (ref 4.0–10.5)
nRBC: 0 % (ref 0.0–0.2)

## 2023-03-02 LAB — BASIC METABOLIC PANEL
Anion gap: 12 (ref 5–15)
BUN: 25 mg/dL — ABNORMAL HIGH (ref 6–20)
CO2: 20 mmol/L — ABNORMAL LOW (ref 22–32)
Calcium: 8.9 mg/dL (ref 8.9–10.3)
Chloride: 99 mmol/L (ref 98–111)
Creatinine, Ser: 1.25 mg/dL — ABNORMAL HIGH (ref 0.61–1.24)
GFR, Estimated: >60 mL/min (ref >60)
Glucose, Bld: 109 mg/dL — ABNORMAL HIGH (ref 70–99)
Potassium: 3.8 mmol/L (ref 3.5–5.1)
Sodium: 131 mmol/L — ABNORMAL LOW (ref 135–145)

## 2023-03-02 NOTE — Progress Notes (Signed)
Subjective:  No significant overnight events. Patient reports continued rest pain this morning. He experiences some pain relief when sitting on edge of bed with feet on floor. Bandage on left heel wound also helps reduce pressure. He denies pain at the right groin cath site.  Objective:  Vital signs in last 24 hours: Vitals:   03/01/23 2354 03/02/23 0400 03/02/23 0802 03/02/23 1241  BP: 113/79 111/76 (!) 126/92 116/71  Pulse: 88 86 92 93  Resp: 18 15 20 14   Temp: 97.8 F (36.6 C) 98.1 F (36.7 C) 97.9 F (36.6 C) 97.8 F (36.6 C)  TempSrc: Oral Oral Oral Oral  SpO2: 98% 98% 96% 96%  Weight:      Height:       Physical Exam: Constitutional: Middle-aged male sitting on edge of bed with feet on floor. Resting and in no acute distress. HENT: Normocephalic and atraumatic.  Neck: Normal range of motion.  Cardiovascular: Regular rate, regular rhythm. 2/6 systolic murmur best appreciated at the left upper sternal border. Left lower extremity swelling without pitting. No swelling of the RLE.  Pulmonary: Normal respiratory effort. No wheezes, rales, rhonchi, or crackles.    Neurological: Alert and oriented to person, place, and time. Skin: warm and dry. Bandage on left heel wound. Ulceration on distal end of left second toe unchanged. BLE chronic dry skin changes.   Assessment/Plan:  Principal Problem:   Critical lower limb ischemia (HCC) Active Problems:   Type 2 diabetes mellitus with hyperlipidemia (HCC)   PAD (peripheral artery disease) (HCC)   Stage 3a chronic kidney disease (CKD) (HCC)   Open wound of left foot  # PAD # Left heel wound S/p angioplasty with stent placement of left common iliac artery on 5/1. Patient continues to have rest pain. Vascular surgery is planning for common femoral endarterectomy with redo of femoral to tibial bypass on Monday. Will also need future left second toe amputation. Remains afebrile without leukocytosis. Regarding possible osteomyelitis,  we will not start antibiotics at this time. Plan to consult ortho or podiatry for bone biopsy after surgery on Monday. We will continue medical management with aspirin, atorvastatin, and Xarelto. Will have to hold Xarelto starting tomorrow AM in preparation for surgery.  - Appreciate vascular surgery recommendations - Xarelto 2.5 mg twice daily. Hold starting Saturday AM.  - Continue home aspirin 81 mg daily - Continue home atorvastatin 40 mg daily  # Non-ischemic cardiomyopathy # Pulmonary hypertension # HFrEF, EF 20-25% # CAD No signs of volume overload today. Will continue home metoprolol, Entresto, spironolactone, and [place]. Lasix held again today given risk for contrast-induced nephropathy still present since within 24-48 hours of angiogram. - Metoprolol succinate 50 mg daily - Entresto 24-26 mg BID - Spironolactone 12.5 mg daily  - Atorvastatin 40 mg - Aspirin 81 mg  - Farxiga 10 mg   # Type 2 Diabetes  Fasting glucose stable at 109 today. Patient reports continued neuropathic pain, though he only received one dose of oxycodone 5 mg during past 24 hours. Will continue gabapentin 400 mg TID with hopes of weaning off opioids.  - Continue home Farxiga 10 mg daily - Gabapentin 400 mg TID - Oxycodone 5 mg Q6 PRN  # CKD stage 3a  Creatinine 1.25 today. Patient still at risk for contrast-induced nephropathy from angiogram since within 24-48 hours. Lasix held today. Will continue monitoring renal function.  - Trend BMP   # Hypertension  BP stable with MAPs in 80-90s. Will continue home [place] and  metoprolol.  - Entresto 24-26 mg BID - Metoprolol succinate 50 mg daily  Diet:  HH/CM Bowel: Senna VTE: Xarelto and aspirin IVF: None Code: Full PT/OT recs: none LOS: day 3   Prior to Admission Living Arrangement: home with self Anticipated Discharge Location: TBD Barriers to Discharge: continued management  Whitman Hero, Medical Student 03/02/2023, 12:49 PM

## 2023-03-02 NOTE — Progress Notes (Signed)
  Progress Note    03/02/2023 7:24 AM 2 Days Post-Op  Subjective:  still having rest pain in the left foot. Trying to rest while sitting up in bed right now and feet on the ground    Vitals:   03/01/23 2354 03/02/23 0400  BP: 113/79 111/76  Pulse: 88 86  Resp: 18 15  Temp: 97.8 F (36.6 C) 98.1 F (36.7 C)  SpO2: 98% 98%    Physical Exam: General:  no acute distress Cardiac:  regular Lungs:  nonlabored Extremities:  L PT/Peroneal doppler signals. Intact motor and sensation. Dry gangrene of left 2nd toe. Dry ulceration of left heel  CBC    Component Value Date/Time   WBC 7.7 03/02/2023 0156   RBC 4.50 03/02/2023 0156   HGB 12.5 (L) 03/02/2023 0156   HCT 38.0 (L) 03/02/2023 0156   PLT 164 03/02/2023 0156   MCV 84.4 03/02/2023 0156   MCH 27.8 03/02/2023 0156   MCHC 32.9 03/02/2023 0156   RDW 14.7 03/02/2023 0156   LYMPHSABS 1.1 02/27/2023 0858   MONOABS 0.6 02/27/2023 0858   EOSABS 0.1 02/27/2023 0858   BASOSABS 0.0 02/27/2023 0858    BMET    Component Value Date/Time   NA 131 (L) 03/02/2023 0156   K 3.8 03/02/2023 0156   CL 99 03/02/2023 0156   CO2 20 (L) 03/02/2023 0156   GLUCOSE 109 (H) 03/02/2023 0156   BUN 25 (H) 03/02/2023 0156   CREATININE 1.25 (H) 03/02/2023 0156   CALCIUM 8.9 03/02/2023 0156   GFRNONAA >60 03/02/2023 0156   GFRAA 54 (L) 10/19/2019 0616    INR    Component Value Date/Time   INR 1.2 12/12/2021 0015     Intake/Output Summary (Last 24 hours) at 03/02/2023 0724 Last data filed at 03/01/2023 1300 Gross per 24 hour  Intake 480 ml  Output 700 ml  Net -220 ml      Assessment/Plan:  57 y.o. male is 2 days post op, s/p: LLE angiogram with left common iliac angioplasty and stenting    -Unfortunately still having rest pain in the left foot. Still agreeable to left common femoral endarterectomy with profundoplasty and redo fem-tib bypass on Monday with Dr.Clark. Potentially future left 2nd toe amputation -Left 2nd toe dry gangrene  and left heel ulceration unchanged. Left PT/Peroneal doppler signals -Continue ASA and statin. Xarelto will be held 48 hrs prior to surgery   Loel Dubonnet, PA-C [facility] [phone] 03/02/2023 7:24 AM

## 2023-03-02 NOTE — Progress Notes (Signed)
Mobility Specialist: Progress Note   03/02/23 1257  Mobility  Activity Ambulated with assistance in hallway  Level of Assistance Standby assist, set-up cues, supervision of patient - no hands on  Assistive Device Front wheel walker  Distance Ambulated (ft) 200 ft  Activity Response Tolerated well  Mobility Referral Yes  $Mobility charge 1 Mobility   Pre-Mobility: 84 HR, 116/71 (85) BP, 99% SpO2 Post-Mobility: 97 HR  Received pt in bed having no complaints and agreeable to mobility. Pt was asymptomatic throughout ambulation and returned to room w/o fault. Left sitting EOB w/ call bell in reach and all needs met.  Alicyn Klann Mobility Specialist Please contact via SecureChat or Rehab office at [phone]

## 2023-03-02 NOTE — Progress Notes (Signed)
  Transition of Care [facility]) Screening Note   Patient Details  Name: Edward Rocha. Date of Birth: October 05, 1966   Transition of Care [facility]) CM/SW Contact:    Darrold Span, RN Phone Number: 03/02/2023, 2:57 PM    Transition of Care Department [facility]) has reviewed patient and note pt s/p  LLE angiogram with left common iliac angioplasty and stenting with plans for vascular bypass on Monday. We will continue to monitor patient advancement through interdisciplinary progression rounds. If new patient transition needs arise, please place a TOC consult.

## 2023-03-03 LAB — BASIC METABOLIC PANEL
Anion gap: 8 (ref 5–15)
BUN: 29 mg/dL — ABNORMAL HIGH (ref 6–20)
CO2: 21 mmol/L — ABNORMAL LOW (ref 22–32)
Calcium: 8.4 mg/dL — ABNORMAL LOW (ref 8.9–10.3)
Chloride: 102 mmol/L (ref 98–111)
Creatinine, Ser: 1.58 mg/dL — ABNORMAL HIGH (ref 0.61–1.24)
GFR, Estimated: 50 mL/min — ABNORMAL LOW (ref >60)
Glucose, Bld: 147 mg/dL — ABNORMAL HIGH (ref 70–99)
Potassium: 4.1 mmol/L (ref 3.5–5.1)
Sodium: 131 mmol/L — ABNORMAL LOW (ref 135–145)

## 2023-03-03 NOTE — Progress Notes (Signed)
Mobility Specialist Progress Note   03/03/23 1020  Mobility  Activity Ambulated with assistance in hallway  Level of Assistance Standby assist, set-up cues, supervision of patient - no hands on  Assistive Device Front wheel walker  Distance Ambulated (ft) 230 ft  Activity Response Tolerated well  Mobility Referral Yes  $Mobility charge 1 Mobility   Received pt sitting EOB c/o L foot pain(7/10) but agreeable. Pt requiring no physical assist throughout session but stand by assist for safety. Ambulated w/ a steady yet antalgic like gait d/t pain. Returned back to EOB w/o fault, call bell in reach and all needs met.    Frederico Hamman Mobility Specialist Please contact via SecureChat or  Rehab office at [phone]

## 2023-03-03 NOTE — Progress Notes (Signed)
Subjective:  No significant overnight events. Patient reports continued rest pain, particularly in the left foot. He attributes the pain to both diabetic neuropathy and the left heel wound. He was able to walk 200 feet with assistance and bathe yesterday. He denies chest pain, shortness of breath, nausea, and vomiting.   Objective:  Vital signs in last 24 hours: Vitals:   03/02/23 1642 03/02/23 2006 03/02/23 2342 03/03/23 0405  BP: 106/73 106/76 99/69 111/85  Pulse: 98 92 87 93  Resp: 20 17 15 19   Temp: (!) 97.3 F (36.3 C) 97.8 F (36.6 C) 97.8 F (36.6 C) 98.7 F (37.1 C)  TempSrc: Oral Oral Oral Oral  SpO2: 95% 98% 100% 97%  Weight:      Height:       Physical Exam: Constitutional: Patient sitting on side of bed with feet on floor and resting head on bedside table. Appears comfortable and in no acute distress. HENT: Normocephalic and atraumatic.  Neck: Normal range of motion.  Cardiovascular: Regular rate, regular rhythm. 2/6 systolic murmur best appreciated at the left upper sternal border. Left lower extremity swelling without pitting. No swelling of the RLE. Pulmonary: Normal respiratory effort. No wheezes, rales, rhonchi, or crackles.    Neurological: Alert and oriented to person, place, and time. Skin: Warm and dry. Bandage on left heel wound. Ulceration on distal end of left second toe unchanged. BLE chronic dry skin changes   Assessment/Plan:  Principal Problem:   Critical lower limb ischemia (HCC) Active Problems:   Type 2 diabetes mellitus with hyperlipidemia (HCC)   PAD (peripheral artery disease) (HCC)   Stage 3a chronic kidney disease (CKD) (HCC)   Open wound of left foot   # PAD # Left heel wound S/p angioplasty with stent placement of left common iliac artery on 5/1. Patient reports continued rest pain in his left foot that improves slightly when hanging the foot off the bed onto the floor. Plan for common femoral endarterectomy with redo of femoral to  tibial bypass on 5/6. Holding xarelto on 5/4-5/5 for this procedure. Will need ortho vs podiatry consult following this procedure for bone biopsy. Will also need left 2nd toe amputation. Remains afebrile without leukocytosis. - Appreciate vascular surgery recommendations - Continue home atorvastatin 40 mg daily - Continue home aspirin 81 mg daily - Hold Xarelto   # Non-ischemic cardiomyopathy # Pulmonary hypertension # HFrEF, EF 20-25% # CAD No signs of volume overload on exam today. Will hold Entresto, spironolactone, and Lasix given elevated creatinine likely in the setting of contrast-induced nephropathy from angiogram. Will continue home metoprolol and Farxiga.  - Hold Entresto 24-26 mg BID - Hold spironolactone 12.5 mg daily  - Metoprolol succinate 50 mg daily - Atorvastatin 40 mg - Aspirin 81 mg  - Farxiga 10 mg    # Type 2 Diabetes  Fasting glucose stable at 147 today. Patient reports continued neuropathic pain. He received two doses of oxycodone 5 mg in the past 24 hours. Will continue gabapentin 400 mg TID. May increase in near future as renal function improves with hopes of weaning off opioids. - Continue home Farxiga 10 mg daily - Gabapentin 400 mg TID - Oxycodone 5 mg Q6 PRN   # CKD stage 3a  Creatinine 1.58 from baseline of 1-1.2. Elevation likely due to contrast-induced nephropathy from angiogram on 5/1. Encouraged increased PO intake today, want to avoid IV fluids if possible given HFrEF. Bladder scan w/o signs of urinary retention. Will continue monitoring renal function.  -  Trend BMP    # Hypertension  BP stable with MAPs in 80-90s. Will hold Entresto given worsening renal function. Continue home metoprolol.  - Metoprolol succinate 50 mg daily - Hold Entresto 24-26 mg BID   Diet:  HH/CM Bowel: Senna VTE: Xarelto and aspirin IVF: None Code: Full PT/OT recs: none LOS: day 4   Prior to Admission Living Arrangement: home with self Anticipated Discharge Location:  TBD Barriers to Discharge: continued management  Whitman Hero, Medical Student 03/03/2023, 6:31 AM   Attestation for Student Documentation:  I personally was present and performed or re-performed the history, physical exam and medical decision-making activities of this service and have verified that the service and findings are accurately documented in the student's note.  Karoline Caldwell, MD 03/03/2023, 9:49 AM

## 2023-03-04 DIAGNOSIS — I70229 Atherosclerosis of native arteries of extremities with rest pain, unspecified extremity: Secondary | ICD-10-CM | POA: Diagnosis not present

## 2023-03-04 DIAGNOSIS — I428 Other cardiomyopathies: Secondary | ICD-10-CM | POA: Diagnosis not present

## 2023-03-04 DIAGNOSIS — I272 Pulmonary hypertension, unspecified: Secondary | ICD-10-CM | POA: Diagnosis not present

## 2023-03-04 DIAGNOSIS — I130 Hypertensive heart and chronic kidney disease with heart failure and stage 1 through stage 4 chronic kidney disease, or unspecified chronic kidney disease: Secondary | ICD-10-CM | POA: Diagnosis not present

## 2023-03-04 DIAGNOSIS — I5020 Unspecified systolic (congestive) heart failure: Secondary | ICD-10-CM

## 2023-03-04 LAB — BASIC METABOLIC PANEL
Anion gap: 9 (ref 5–15)
BUN: 26 mg/dL — ABNORMAL HIGH (ref 6–20)
CO2: 20 mmol/L — ABNORMAL LOW (ref 22–32)
Calcium: 8.7 mg/dL — ABNORMAL LOW (ref 8.9–10.3)
Chloride: 101 mmol/L (ref 98–111)
Creatinine, Ser: 1.35 mg/dL — ABNORMAL HIGH (ref 0.61–1.24)
GFR, Estimated: >60 mL/min (ref >60)
Glucose, Bld: 116 mg/dL — ABNORMAL HIGH (ref 70–99)
Potassium: 4 mmol/L (ref 3.5–5.1)
Sodium: 130 mmol/L — ABNORMAL LOW (ref 135–145)

## 2023-03-04 MED ORDER — GABAPENTIN 400 MG PO CAPS
500.0000 mg | ORAL_CAPSULE | Freq: Three times a day (TID) | ORAL | Status: DC
  Administered 2023-03-04 – 2023-03-12 (×24): 500 mg via ORAL
  Filled 2023-03-04 (×24): qty 1

## 2023-03-04 MED ORDER — FUROSEMIDE 40 MG PO TABS
40.0000 mg | ORAL_TABLET | Freq: Every day | ORAL | Status: DC
  Administered 2023-03-04: 40 mg via ORAL
  Filled 2023-03-04: qty 1

## 2023-03-04 NOTE — Progress Notes (Addendum)
  Progress Note    03/04/2023 8:36 AM 4 Days Post-Op  Subjective:  feels okay. Still having rest pain in the left foot. Also reports rest pain in the right foot. Has to sleep sitting up in the chair or laying down with his legs off the bed for relief    Vitals:   03/04/23 0518 03/04/23 0728  BP: 109/82 103/75  Pulse: 98 94  Resp: 16 18  Temp: 98.1 F (36.7 C) 97.7 F (36.5 C)  SpO2: 98% 100%    Physical Exam: General:  no acute distress Cardiac:  regular Lungs:  nonlabored Extremities:  LLE with dampened monophasic PT/Pero/AT doppler signals. Dry gangrene of L 2nd toe and ulceration of L heel. RLE with brisk DP doppler signal   CBC    Component Value Date/Time   WBC 7.7 03/02/2023 0156   RBC 4.50 03/02/2023 0156   HGB 12.5 (L) 03/02/2023 0156   HCT 38.0 (L) 03/02/2023 0156   PLT 164 03/02/2023 0156   MCV 84.4 03/02/2023 0156   MCH 27.8 03/02/2023 0156   MCHC 32.9 03/02/2023 0156   RDW 14.7 03/02/2023 0156   LYMPHSABS 1.1 02/27/2023 0858   MONOABS 0.6 02/27/2023 0858   EOSABS 0.1 02/27/2023 0858   BASOSABS 0.0 02/27/2023 0858    BMET    Component Value Date/Time   NA 130 (L) 03/04/2023 0102   K 4.0 03/04/2023 0102   CL 101 03/04/2023 0102   CO2 20 (L) 03/04/2023 0102   GLUCOSE 116 (H) 03/04/2023 0102   BUN 26 (H) 03/04/2023 0102   CREATININE 1.35 (H) 03/04/2023 0102   CALCIUM 8.7 (L) 03/04/2023 0102   GFRNONAA >60 03/04/2023 0102   GFRAA 54 (L) 10/19/2019 0616    INR    Component Value Date/Time   INR 1.2 12/12/2021 0015     Intake/Output Summary (Last 24 hours) at 03/04/2023 0836 Last data filed at 03/04/2023 0729 Gross per 24 hour  Intake 360 ml  Output 1100 ml  Net -740 ml      Assessment/Plan:  57 y.o. male is 4 days post op, s/p: LLE angiogram with left common iliac angioplasty and stenting    -Still experiencing rest pain in the left foot. He has dampened monophasic left PT/Pero/AT doppler signals. Dry gangrene of the left 2nd toe with  left heel ulceration, possible osteo -Plan is for left common femoral endarterectomy with profundoplasty and redo fem-tib bypass with Dr.Clark tomorrow. Possible left 2nd toe amputation. -He is also reporting rest pain in the right foot this morning. He has a brisk right DP doppler signal. Will consider intervention on this leg after surgery on the left -Continue ASA and statin. His Xarelto has been held for 48 hours. NPO past midnight. I will place consent orders   Loel Dubonnet, PA-C [facility] [phone] 03/04/2023 8:36 AM  N.p.o. after midnight.  Xarelto remains on hold.  Plan for surgical intervention tomorrow with Dr. Evelene Croon

## 2023-03-04 NOTE — Progress Notes (Addendum)
Subjective:   Patient is feeling okay this morning.  Had difficulty sleeping overnight due to lower extremity pain.  Otherwise no new complaints.  Objective:  Vital signs in last 24 hours: Vitals:   03/03/23 1950 03/03/23 1951 03/03/23 2335 03/04/23 0518  BP: 98/67  101/67 109/82  Pulse: 90  92 98  Resp: 18 15 20 16   Temp: 98.1 F (36.7 C)  98.6 F (37 C) 98.1 F (36.7 C)  TempSrc: Oral  Oral Oral  SpO2: 98%  98% 98%  Weight:      Height:       Physical Exam: Constitutional: sitting in his chair, appears uncomfortable, not in acute distress Cardiovascular: Regular rate, regular rhythm. 2/6 systolic murmur at LUSB. Left lower extremity swelling with minimal pitting. No swelling of the RLE. Pulmonary: Normal respiratory effort. No wheezes or crackles.    Neurological: Alert and oriented to person, place, and time. Skin: Warm and dry. Bandage on left heel wound. Ulceration on distal end of left second toe unchanged. BLE chronic dry skin changes  Assessment/Plan:  Principal Problem:   Critical lower limb ischemia (HCC) Active Problems:   Type 2 diabetes mellitus with hyperlipidemia (HCC)   PAD (peripheral artery disease) (HCC)   Stage 3a chronic kidney disease (CKD) (HCC)   Open wound of left foot  # PAD # Left heel wound S/p angioplasty w/ stent placement of left common iliac artery on 5/1. Bilateral feet pain is still present today. Common femoral endarterectomy with redo of femoral to tibial bypass scheduled for 5/6. Holding xarelto on 5/4-5/5 for procedure. Will need ortho vs podiatry consult after procedure for bone biopsy of left calcaneus given concern for osteo on MRI. Will also need left 2nd toe amputation. Remains afebrile.  - Appreciate vascular surgery recommendations - Atorvastatin 40 mg daily - Aspirin 81 mg daily - Hold Xarelto   # Non-ischemic cardiomyopathy # Pulmonary hypertension # HFrEF, EF 20-25% # CAD Have been holding lasix, entresto, and  Farxiga due to elevated creatinine in the setting of recent angiogram. Has mild LLE swelling today. Will restart home lasix. Can likely restart entresto tomorrow if renal function improves.  - Restart home lasix 40 mg  - Hold Entresto 24-26 mg BID - Hold spironolactone 12.5 mg daily  - Metoprolol succinate 50 mg daily - Atorvastatin 40 mg - Aspirin 81 mg  - Farxiga 10 mg    # Type 2 Diabetes  Fasting glucose 116 today. Received 1X dose of oxycodone yesterday. Reminded patient to ask for PRN oxycodone. Neuropathic pain today is present and causing him to have difficulty sleeping.  - Continue home Farxiga 10 mg daily - Increase Gabapentin to 500 mg TID - Oxycodone 5 mg Q6 PRN   # AKI on CKD stage 3a improving Cr improved to 1.35 today. Holding [place], and [place] due to elevated creatinine in the setting of recent angiogram.  - Encourage PO intake - Trend BMP    # Hypertension  BP remains stable w/ MAP b/t 70s-90s.  - Metoprolol succinate 50 mg daily - Hold Entresto 24-26 mg BID   Diet:  HH/CM Bowel: Senna VTE: aspirin IVF: None Code: Full PT/OT recs: none LOS: day 5   Prior to Admission Living Arrangement: home with self Anticipated Discharge Location: TBD Barriers to Discharge: continued management Dispo: Anticipated discharge in approximately more than 2 day(s).   Karoline Caldwell, MD 03/04/2023, 6:26 AM Pager: [phone] After 5pm on weekdays and 1pm on weekends: On Call pager [phone]

## 2023-03-05 ENCOUNTER — Encounter (HOSPITAL_COMMUNITY): Payer: Self-pay | Admitting: Student in an Organized Health Care Education/Training Program

## 2023-03-05 ENCOUNTER — Inpatient Hospital Stay (HOSPITAL_COMMUNITY): Payer: Medicare Other

## 2023-03-05 ENCOUNTER — Encounter (HOSPITAL_COMMUNITY)
Admission: EM | Disposition: A | Discharge status: Home Health | Payer: Self-pay | Source: Home / Self Care | Attending: Infectious Diseases

## 2023-03-05 ENCOUNTER — Other Ambulatory Visit: Payer: Self-pay

## 2023-03-05 DIAGNOSIS — M86172 Other acute osteomyelitis, left ankle and foot: Secondary | ICD-10-CM | POA: Diagnosis not present

## 2023-03-05 DIAGNOSIS — T82898A Other specified complication of vascular prosthetic devices, implants and grafts, initial encounter: Secondary | ICD-10-CM

## 2023-03-05 DIAGNOSIS — T82858A Stenosis of vascular prosthetic devices, implants and grafts, initial encounter: Secondary | ICD-10-CM | POA: Diagnosis not present

## 2023-03-05 DIAGNOSIS — Z7984 Long term (current) use of oral hypoglycemic drugs: Secondary | ICD-10-CM

## 2023-03-05 DIAGNOSIS — E11621 Type 2 diabetes mellitus with foot ulcer: Secondary | ICD-10-CM

## 2023-03-05 DIAGNOSIS — L97428 Non-pressure chronic ulcer of left heel and midfoot with other specified severity: Secondary | ICD-10-CM

## 2023-03-05 DIAGNOSIS — I70244 Atherosclerosis of native arteries of left leg with ulceration of heel and midfoot: Secondary | ICD-10-CM | POA: Diagnosis not present

## 2023-03-05 DIAGNOSIS — E1152 Type 2 diabetes mellitus with diabetic peripheral angiopathy with gangrene: Secondary | ICD-10-CM

## 2023-03-05 DIAGNOSIS — E1159 Type 2 diabetes mellitus with other circulatory complications: Secondary | ICD-10-CM

## 2023-03-05 DIAGNOSIS — I5020 Unspecified systolic (congestive) heart failure: Secondary | ICD-10-CM

## 2023-03-05 DIAGNOSIS — I70229 Atherosclerosis of native arteries of extremities with rest pain, unspecified extremity: Secondary | ICD-10-CM | POA: Diagnosis not present

## 2023-03-05 DIAGNOSIS — I70262 Atherosclerosis of native arteries of extremities with gangrene, left leg: Secondary | ICD-10-CM

## 2023-03-05 DIAGNOSIS — Z794 Long term (current) use of insulin: Secondary | ICD-10-CM

## 2023-03-05 DIAGNOSIS — I96 Gangrene, not elsewhere classified: Secondary | ICD-10-CM | POA: Diagnosis not present

## 2023-03-05 HISTORY — PX: ENDARTERECTOMY FEMORAL: SHX5804

## 2023-03-05 HISTORY — PX: IRRIGATION AND DEBRIDEMENT FOOT: SHX6602

## 2023-03-05 HISTORY — PX: AMPUTATION TOE: SHX6595

## 2023-03-05 HISTORY — PX: FEMORAL-TIBIAL BYPASS GRAFT: SHX938

## 2023-03-05 LAB — BASIC METABOLIC PANEL
Anion gap: 7 (ref 5–15)
BUN: 27 mg/dL — ABNORMAL HIGH (ref 6–20)
CO2: 24 mmol/L (ref 22–32)
Calcium: 8.8 mg/dL — ABNORMAL LOW (ref 8.9–10.3)
Chloride: 102 mmol/L (ref 98–111)
Creatinine, Ser: 1.28 mg/dL — ABNORMAL HIGH (ref 0.61–1.24)
GFR, Estimated: >60 mL/min (ref >60)
Glucose, Bld: 146 mg/dL — ABNORMAL HIGH (ref 70–99)
Potassium: 4.6 mmol/L (ref 3.5–5.1)
Sodium: 133 mmol/L — ABNORMAL LOW (ref 135–145)

## 2023-03-05 LAB — CBC
HCT: 35.1 % — ABNORMAL LOW (ref 39.0–52.0)
Hemoglobin: 11.4 g/dL — ABNORMAL LOW (ref 13.0–17.0)
MCH: 27.8 pg (ref 26.0–34.0)
MCHC: 32.5 g/dL (ref 30.0–36.0)
MCV: 85.6 fL (ref 80.0–100.0)
Platelets: 173 10*3/uL (ref 150–400)
RBC: 4.1 MIL/uL — ABNORMAL LOW (ref 4.22–5.81)
RDW: 14.9 % (ref 11.5–15.5)
WBC: 7.3 10*3/uL (ref 4.0–10.5)
nRBC: 0 % (ref 0.0–0.2)

## 2023-03-05 LAB — POCT ACTIVATED CLOTTING TIME
Activated Clotting Time: 276 seconds
Activated Clotting Time: 223 seconds
Activated Clotting Time: 244 seconds

## 2023-03-05 LAB — SURGICAL PCR SCREEN
MRSA, PCR: NEGATIVE
Staphylococcus aureus: POSITIVE — AB

## 2023-03-05 LAB — TYPE AND SCREEN
ABO/RH(D): O POS
Antibody Screen: NEGATIVE

## 2023-03-05 LAB — GLUCOSE, CAPILLARY
Glucose-Capillary: 107 mg/dL — ABNORMAL HIGH (ref 70–99)
Glucose-Capillary: 152 mg/dL — ABNORMAL HIGH (ref 70–99)

## 2023-03-05 LAB — AEROBIC/ANAEROBIC CULTURE W GRAM STAIN (SURGICAL/DEEP WOUND)

## 2023-03-05 SURGERY — ENDARTERECTOMY, FEMORAL
Anesthesia: General | Site: Toe | Laterality: Left

## 2023-03-05 MED ORDER — PROPOFOL 10 MG/ML IV BOLUS
INTRAVENOUS | Status: AC
  Filled 2023-03-05: qty 20

## 2023-03-05 MED ORDER — CEFAZOLIN SODIUM-DEXTROSE 2-3 GM-%(50ML) IV SOLR
INTRAVENOUS | Status: DC | PRN
  Administered 2023-03-05: 2 g via INTRAVENOUS

## 2023-03-05 MED ORDER — ATORVASTATIN CALCIUM 80 MG PO TABS
80.0000 mg | ORAL_TABLET | Freq: Every day | ORAL | Status: DC
  Administered 2023-03-06 – 2023-03-12 (×7): 80 mg via ORAL
  Filled 2023-03-05 (×7): qty 1

## 2023-03-05 MED ORDER — PHENOL 1.4 % MT LIQD: 1.0000 | OROMUCOSAL | Status: DC | PRN

## 2023-03-05 MED ORDER — PROPOFOL 10 MG/ML IV BOLUS
INTRAVENOUS | Status: DC | PRN
  Administered 2023-03-05: 100 mg via INTRAVENOUS
  Administered 2023-03-05: 50 mg via INTRAVENOUS

## 2023-03-05 MED ORDER — HEPARIN SODIUM (PORCINE) 1000 UNIT/ML IJ SOLN
INTRAMUSCULAR | Status: AC
  Filled 2023-03-05: qty 20

## 2023-03-05 MED ORDER — MAGNESIUM SULFATE 2 GM/50ML IV SOLN: 2.0000 g | Freq: Every day | INTRAVENOUS | Status: DC | PRN

## 2023-03-05 MED ORDER — METOPROLOL SUCCINATE ER 25 MG PO TB24
ORAL_TABLET | ORAL | Status: AC
  Administered 2023-03-05: 50 mg
  Filled 2023-03-05: qty 2

## 2023-03-05 MED ORDER — PHENYLEPHRINE HCL-NACL 20-0.9 MG/250ML-% IV SOLN
INTRAVENOUS | Status: DC | PRN
  Administered 2023-03-05: 40 ug/min via INTRAVENOUS

## 2023-03-05 MED ORDER — FENTANYL CITRATE (PF) 250 MCG/5ML IJ SOLN
INTRAMUSCULAR | Status: DC | PRN
  Administered 2023-03-05 (×2): 50 ug via INTRAVENOUS
  Administered 2023-03-05: 100 ug via INTRAVENOUS
  Administered 2023-03-05 (×2): 50 ug via INTRAVENOUS

## 2023-03-05 MED ORDER — PHENYLEPHRINE 80 MCG/ML (10ML) SYRINGE FOR IV PUSH (FOR BLOOD PRESSURE SUPPORT)
PREFILLED_SYRINGE | INTRAVENOUS | Status: DC | PRN
  Administered 2023-03-05 (×2): 160 ug via INTRAVENOUS
  Administered 2023-03-05: 80 ug via INTRAVENOUS
  Administered 2023-03-05: 120 ug via INTRAVENOUS
  Administered 2023-03-05: 160 ug via INTRAVENOUS
  Administered 2023-03-05: 80 ug via INTRAVENOUS
  Administered 2023-03-05: 120 ug via INTRAVENOUS
  Administered 2023-03-05: 160 ug via INTRAVENOUS

## 2023-03-05 MED ORDER — LIDOCAINE 2% (20 MG/ML) 5 ML SYRINGE
INTRAMUSCULAR | Status: AC
  Filled 2023-03-05: qty 5

## 2023-03-05 MED ORDER — VANCOMYCIN HCL 2000 MG/400ML IV SOLN
2000.0000 mg | Freq: Once | INTRAVENOUS | Status: AC
  Administered 2023-03-05: 2000 mg via INTRAVENOUS
  Filled 2023-03-05 (×2): qty 400

## 2023-03-05 MED ORDER — OXYCODONE HCL 5 MG/5ML PO SOLN: 5.0000 mg | Freq: Once | ORAL | Status: DC | PRN

## 2023-03-05 MED ORDER — LIDOCAINE 2% (20 MG/ML) 5 ML SYRINGE
INTRAMUSCULAR | Status: DC | PRN
  Administered 2023-03-05: 80 mg via INTRAVENOUS

## 2023-03-05 MED ORDER — AMISULPRIDE (ANTIEMETIC) 5 MG/2ML IV SOLN: 10.0000 mg | Freq: Once | INTRAVENOUS | Status: DC | PRN

## 2023-03-05 MED ORDER — SODIUM CHLORIDE 0.9 % IV SOLN: 500.0000 mL | Freq: Once | INTRAVENOUS | Status: DC | PRN

## 2023-03-05 MED ORDER — CEFAZOLIN SODIUM-DEXTROSE 2-4 GM/100ML-% IV SOLN: 2.0000 g | Freq: Three times a day (TID) | INTRAVENOUS | Status: DC

## 2023-03-05 MED ORDER — VASOPRESSIN 20 UNIT/ML IV SOLN
INTRAVENOUS | Status: AC
  Filled 2023-03-05: qty 1

## 2023-03-05 MED ORDER — ALUM & MAG HYDROXIDE-SIMETH 200-200-20 MG/5ML PO SUSP: 15.0000 mL | ORAL | Status: DC | PRN

## 2023-03-05 MED ORDER — ESMOLOL HCL 100 MG/10ML IV SOLN
INTRAVENOUS | Status: DC | PRN
  Administered 2023-03-05: 30 mg via INTRAVENOUS
  Administered 2023-03-05: 40 mg via INTRAVENOUS
  Administered 2023-03-05: 30 mg via INTRAVENOUS

## 2023-03-05 MED ORDER — PROMETHAZINE HCL 25 MG/ML IJ SOLN: 6.2500 mg | INTRAMUSCULAR | Status: DC | PRN

## 2023-03-05 MED ORDER — FENTANYL CITRATE (PF) 250 MCG/5ML IJ SOLN
INTRAMUSCULAR | Status: AC
  Filled 2023-03-05: qty 5

## 2023-03-05 MED ORDER — SENNOSIDES-DOCUSATE SODIUM 8.6-50 MG PO TABS: 1.0000 | ORAL_TABLET | Freq: Every evening | ORAL | Status: DC | PRN

## 2023-03-05 MED ORDER — HEPARIN SODIUM (PORCINE) 1000 UNIT/ML IJ SOLN
INTRAMUSCULAR | Status: DC | PRN
  Administered 2023-03-05: 3000 [IU] via INTRAVENOUS
  Administered 2023-03-05: 9000 [IU] via INTRAVENOUS

## 2023-03-05 MED ORDER — ACETAMINOPHEN 325 MG PO TABS: 325.0000 mg | ORAL_TABLET | ORAL | Status: DC | PRN

## 2023-03-05 MED ORDER — LACTATED RINGERS IV SOLN: INTRAVENOUS | Status: DC

## 2023-03-05 MED ORDER — SUGAMMADEX SODIUM 200 MG/2ML IV SOLN
INTRAVENOUS | Status: DC | PRN
  Administered 2023-03-05: 200 mg via INTRAVENOUS

## 2023-03-05 MED ORDER — PANTOPRAZOLE SODIUM 40 MG PO TBEC
40.0000 mg | DELAYED_RELEASE_TABLET | Freq: Every day | ORAL | Status: DC
  Administered 2023-03-05 – 2023-03-12 (×8): 40 mg via ORAL
  Filled 2023-03-05 (×8): qty 1

## 2023-03-05 MED ORDER — METOPROLOL TARTRATE 5 MG/5ML IV SOLN: 2.0000 mg | INTRAVENOUS | Status: DC | PRN

## 2023-03-05 MED ORDER — HEPARIN 6000 UNIT IRRIGATION SOLUTION
Status: DC | PRN
  Administered 2023-03-05: 1

## 2023-03-05 MED ORDER — SUCCINYLCHOLINE CHLORIDE 200 MG/10ML IV SOSY
PREFILLED_SYRINGE | INTRAVENOUS | Status: DC | PRN
  Administered 2023-03-05: 120 mg via INTRAVENOUS

## 2023-03-05 MED ORDER — INSULIN ASPART 100 UNIT/ML IJ SOLN: 0.0000 [IU] | INTRAMUSCULAR | Status: DC | PRN

## 2023-03-05 MED ORDER — CHLORHEXIDINE GLUCONATE 0.12 % MT SOLN
15.0000 mL | Freq: Once | OROMUCOSAL | Status: AC
  Administered 2023-03-05: 15 mL via OROMUCOSAL
  Filled 2023-03-05: qty 15

## 2023-03-05 MED ORDER — HYDROMORPHONE HCL 1 MG/ML IJ SOLN: 0.2500 mg | INTRAMUSCULAR | Status: DC | PRN

## 2023-03-05 MED ORDER — ROCURONIUM BROMIDE 10 MG/ML (PF) SYRINGE
PREFILLED_SYRINGE | INTRAVENOUS | Status: DC | PRN
  Administered 2023-03-05: 30 mg via INTRAVENOUS
  Administered 2023-03-05: 50 mg via INTRAVENOUS
  Administered 2023-03-05 (×2): 20 mg via INTRAVENOUS

## 2023-03-05 MED ORDER — LABETALOL HCL 5 MG/ML IV SOLN: 10.0000 mg | INTRAVENOUS | Status: DC | PRN

## 2023-03-05 MED ORDER — ROCURONIUM BROMIDE 10 MG/ML (PF) SYRINGE
PREFILLED_SYRINGE | INTRAVENOUS | Status: AC
  Filled 2023-03-05: qty 10

## 2023-03-05 MED ORDER — PROTAMINE SULFATE 10 MG/ML IV SOLN
INTRAVENOUS | Status: DC | PRN
  Administered 2023-03-05: 50 mg via INTRAVENOUS

## 2023-03-05 MED ORDER — BISACODYL 5 MG PO TBEC: 5.0000 mg | DELAYED_RELEASE_TABLET | Freq: Every day | ORAL | Status: DC | PRN

## 2023-03-05 MED ORDER — HEPARIN 6000 UNIT IRRIGATION SOLUTION
Status: AC
  Filled 2023-03-05: qty 500

## 2023-03-05 MED ORDER — ONDANSETRON HCL 4 MG/2ML IJ SOLN: 4.0000 mg | Freq: Four times a day (QID) | INTRAMUSCULAR | Status: DC | PRN

## 2023-03-05 MED ORDER — GUAIFENESIN-DM 100-10 MG/5ML PO SYRP: 15.0000 mL | ORAL_SOLUTION | ORAL | Status: DC | PRN

## 2023-03-05 MED ORDER — PROTAMINE SULFATE 10 MG/ML IV SOLN
INTRAVENOUS | Status: AC
  Filled 2023-03-05: qty 5

## 2023-03-05 MED ORDER — 0.9 % SODIUM CHLORIDE (POUR BTL) OPTIME
TOPICAL | Status: DC | PRN
  Administered 2023-03-05: 2000 mL

## 2023-03-05 MED ORDER — DOCUSATE SODIUM 100 MG PO CAPS
100.0000 mg | ORAL_CAPSULE | Freq: Every day | ORAL | Status: DC
  Administered 2023-03-06: 100 mg via ORAL
  Filled 2023-03-05: qty 1

## 2023-03-05 MED ORDER — POTASSIUM CHLORIDE CRYS ER 20 MEQ PO TBCR: 20.0000 meq | EXTENDED_RELEASE_TABLET | Freq: Every day | ORAL | Status: DC | PRN

## 2023-03-05 MED ORDER — DEXAMETHASONE SODIUM PHOSPHATE 10 MG/ML IJ SOLN
INTRAMUSCULAR | Status: DC | PRN
  Administered 2023-03-05: 5 mg via INTRAVENOUS

## 2023-03-05 MED ORDER — HYDRALAZINE HCL 20 MG/ML IJ SOLN: 5.0000 mg | INTRAMUSCULAR | Status: DC | PRN

## 2023-03-05 MED ORDER — SUCCINYLCHOLINE CHLORIDE 200 MG/10ML IV SOSY
PREFILLED_SYRINGE | INTRAVENOUS | Status: AC
  Filled 2023-03-05: qty 10

## 2023-03-05 MED ORDER — SODIUM CHLORIDE 0.9 % IV SOLN
2.0000 g | Freq: Three times a day (TID) | INTRAVENOUS | Status: DC
  Administered 2023-03-05 – 2023-03-12 (×21): 2 g via INTRAVENOUS
  Filled 2023-03-05 (×22): qty 12.5

## 2023-03-05 MED ORDER — ONDANSETRON HCL 4 MG/2ML IJ SOLN
INTRAMUSCULAR | Status: AC
  Filled 2023-03-05: qty 2

## 2023-03-05 MED ORDER — ACETAMINOPHEN 650 MG RE SUPP: 325.0000 mg | RECTAL | Status: DC | PRN

## 2023-03-05 MED ORDER — MIDAZOLAM HCL 2 MG/2ML IJ SOLN
INTRAMUSCULAR | Status: DC | PRN
  Administered 2023-03-05: 2 mg via INTRAVENOUS

## 2023-03-05 MED ORDER — METRONIDAZOLE 500 MG/100ML IV SOLN
500.0000 mg | Freq: Two times a day (BID) | INTRAVENOUS | Status: DC
  Administered 2023-03-05 – 2023-03-12 (×14): 500 mg via INTRAVENOUS
  Filled 2023-03-05 (×16): qty 100

## 2023-03-05 MED ORDER — PHENYLEPHRINE 80 MCG/ML (10ML) SYRINGE FOR IV PUSH (FOR BLOOD PRESSURE SUPPORT)
PREFILLED_SYRINGE | INTRAVENOUS | Status: AC
  Filled 2023-03-05: qty 10

## 2023-03-05 MED ORDER — HEPARIN SODIUM (PORCINE) 5000 UNIT/ML IJ SOLN
5000.0000 [IU] | Freq: Three times a day (TID) | INTRAMUSCULAR | Status: DC
  Administered 2023-03-06 – 2023-03-09 (×9): 5000 [IU] via SUBCUTANEOUS
  Filled 2023-03-05 (×9): qty 1

## 2023-03-05 MED ORDER — VANCOMYCIN HCL IN DEXTROSE 1-5 GM/200ML-% IV SOLN
1000.0000 mg | Freq: Two times a day (BID) | INTRAVENOUS | Status: DC
  Administered 2023-03-06 – 2023-03-09 (×7): 1000 mg via INTRAVENOUS
  Filled 2023-03-05 (×8): qty 200

## 2023-03-05 MED ORDER — SODIUM CHLORIDE 0.9 % IV SOLN: INTRAVENOUS | Status: DC

## 2023-03-05 MED ORDER — MIDAZOLAM HCL 2 MG/2ML IJ SOLN
INTRAMUSCULAR | Status: AC
  Filled 2023-03-05: qty 2

## 2023-03-05 MED ORDER — HYDROMORPHONE HCL 1 MG/ML IJ SOLN: 0.5000 mg | INTRAMUSCULAR | Status: DC | PRN

## 2023-03-05 MED ORDER — OXYCODONE HCL 5 MG PO TABS: 5.0000 mg | ORAL_TABLET | Freq: Once | ORAL | Status: DC | PRN

## 2023-03-05 MED ORDER — DEXAMETHASONE SODIUM PHOSPHATE 10 MG/ML IJ SOLN
INTRAMUSCULAR | Status: AC
  Filled 2023-03-05: qty 1

## 2023-03-05 MED ORDER — ONDANSETRON HCL 4 MG/2ML IJ SOLN
INTRAMUSCULAR | Status: DC | PRN
  Administered 2023-03-05: 4 mg via INTRAVENOUS

## 2023-03-05 MED ORDER — OXYCODONE-ACETAMINOPHEN 5-325 MG PO TABS
1.0000 | ORAL_TABLET | ORAL | Status: DC | PRN
  Administered 2023-03-06 – 2023-03-07 (×3): 2 via ORAL
  Filled 2023-03-05 (×3): qty 2

## 2023-03-05 MED ORDER — HEMOSTATIC AGENTS (NO CHARGE) OPTIME
TOPICAL | Status: DC | PRN
  Administered 2023-03-05: 2 via TOPICAL

## 2023-03-05 MED ORDER — EPHEDRINE SULFATE-NACL 50-0.9 MG/10ML-% IV SOSY
PREFILLED_SYRINGE | INTRAVENOUS | Status: DC | PRN
  Administered 2023-03-05: 10 mg via INTRAVENOUS
  Administered 2023-03-05: 5 mg via INTRAVENOUS
  Administered 2023-03-05: 10 mg via INTRAVENOUS

## 2023-03-05 MED ORDER — ORAL CARE MOUTH RINSE: 15.0000 mL | Freq: Once | OROMUCOSAL | Status: AC

## 2023-03-05 SURGICAL SUPPLY — 72 items
ADH SKN CLS APL DERMABOND .7 (GAUZE/BANDAGES/DRESSINGS) ×6
AGENT HMST SPONGE THK3/8 (HEMOSTASIS)
BAG COUNTER SPONGE SURGICOUNT (BAG) ×3 IMPLANT
BAG SPNG CNTER NS LX DISP (BAG) ×3
BANDAGE ESMARK 6X9 LF (GAUZE/BANDAGES/DRESSINGS) IMPLANT
BLADE CLIPPER SURG (BLADE) ×3 IMPLANT
BNDG CMPR 9X6 STRL LF SNTH (GAUZE/BANDAGES/DRESSINGS) ×3
BNDG CMPR STD VLCR NS LF 5.8X4 (GAUZE/BANDAGES/DRESSINGS) ×3
BNDG ELASTIC 4X5.8 VLCR NS LF (GAUZE/BANDAGES/DRESSINGS) IMPLANT
BNDG ESMARK 6X9 LF (GAUZE/BANDAGES/DRESSINGS) ×3
BNDG GAUZE DERMACEA FLUFF 4 (GAUZE/BANDAGES/DRESSINGS) IMPLANT
BNDG GZE DERMACEA 4 6PLY (GAUZE/BANDAGES/DRESSINGS) ×3
CANISTER SUCT 3000ML PPV (MISCELLANEOUS) ×3 IMPLANT
CLIP TI MEDIUM 24 (CLIP) ×3 IMPLANT
CLIP TI WIDE RED SMALL 24 (CLIP) ×3 IMPLANT
CNTNR URN SCR LID CUP LEK RST (MISCELLANEOUS) IMPLANT
CONT SPEC 4OZ STRL OR WHT (MISCELLANEOUS) ×6
COVER PROBE W GEL 5X96 (DRAPES) ×3 IMPLANT
CUFF TOURN SGL QUICK 18X4 (TOURNIQUET CUFF) IMPLANT
CUFF TOURN SGL QUICK 24 (TOURNIQUET CUFF) ×3
CUFF TOURN SGL QUICK 34 (TOURNIQUET CUFF)
CUFF TOURN SGL QUICK 42 (TOURNIQUET CUFF) IMPLANT
CUFF TRNQT CYL 24X4X16.5-23 (TOURNIQUET CUFF) IMPLANT
CUFF TRNQT CYL 24X4X40X1 (TOURNIQUET CUFF) IMPLANT
CUFF TRNQT CYL 34X4.125X (TOURNIQUET CUFF) IMPLANT
DERMABOND ADVANCED .7 DNX12 (GAUZE/BANDAGES/DRESSINGS) ×3 IMPLANT
DRAIN CHANNEL 15F RND FF W/TCR (WOUND CARE) IMPLANT
DRAPE C-ARM 42X72 X-RAY (DRAPES) ×3 IMPLANT
DRAPE HALF SHEET 40X57 (DRAPES) IMPLANT
DRSG XEROFORM 1X8 (GAUZE/BANDAGES/DRESSINGS) IMPLANT
ELECT REM PT RETURN 9FT ADLT (ELECTROSURGICAL) ×3
ELECTRODE REM PT RTRN 9FT ADLT (ELECTROSURGICAL) ×3 IMPLANT
EVACUATOR SILICONE 100CC (DRAIN) IMPLANT
GAUZE XEROFORM 5X9 LF (GAUZE/BANDAGES/DRESSINGS) IMPLANT
GLOVE BIO SURGEON STRL SZ7.5 (GLOVE) ×3 IMPLANT
GLOVE BIOGEL PI IND STRL 8 (GLOVE) ×3 IMPLANT
GOWN STRL REUS W/ TWL LRG LVL3 (GOWN DISPOSABLE) ×9 IMPLANT
GOWN STRL REUS W/ TWL XL LVL3 (GOWN DISPOSABLE) ×6 IMPLANT
GOWN STRL REUS W/TWL LRG LVL3 (GOWN DISPOSABLE) ×9
GOWN STRL REUS W/TWL XL LVL3 (GOWN DISPOSABLE) ×6
GRAFT PROPATEN W/RING 6X80X60 (Vascular Products) IMPLANT
HEMOSTAT SNOW SURGICEL 2X4 (HEMOSTASIS) IMPLANT
HEMOSTAT SPONGE AVITENE ULTRA (HEMOSTASIS) IMPLANT
KIT BASIN OR (CUSTOM PROCEDURE TRAY) ×3 IMPLANT
KIT TURNOVER KIT B (KITS) ×3 IMPLANT
NS IRRIG 1000ML POUR BTL (IV SOLUTION) ×6 IMPLANT
PACK PERIPHERAL VASCULAR (CUSTOM PROCEDURE TRAY) ×3 IMPLANT
PAD ARMBOARD 7.5X6 YLW CONV (MISCELLANEOUS) ×6 IMPLANT
PATCH VASC XENOSURE 1CMX6CM (Vascular Products) ×3 IMPLANT
PATCH VASC XENOSURE 1X6 (Vascular Products) IMPLANT
PENCIL BUTTON HOLSTER BLD 10FT (ELECTRODE) IMPLANT
PUNCH AORTIC ROTATE 5MM 8IN (MISCELLANEOUS) IMPLANT
SPONGE T-LAP 18X18 ~~LOC~~+RFID (SPONGE) IMPLANT
STOPCOCK 4 WAY LG BORE MALE ST (IV SETS) ×3 IMPLANT
SUT ETHILON 3 0 PS 1 (SUTURE) IMPLANT
SUT MNCRL AB 4-0 PS2 18 (SUTURE) ×6 IMPLANT
SUT PROLENE 5 0 C 1 24 (SUTURE) ×3 IMPLANT
SUT PROLENE 6 0 BV (SUTURE) ×3 IMPLANT
SUT PROLENE 7 0 BV 1 (SUTURE) IMPLANT
SUT SILK 2 0 PERMA HAND 18 BK (SUTURE) IMPLANT
SUT SILK 2 0 SH (SUTURE) ×3 IMPLANT
SUT SILK 3 0 (SUTURE)
SUT SILK 3 0 SH CR/8 (SUTURE) IMPLANT
SUT SILK 3-0 18XBRD TIE 12 (SUTURE) IMPLANT
SUT VIC AB 2-0 CT1 27 (SUTURE) ×6
SUT VIC AB 2-0 CT1 TAPERPNT 27 (SUTURE) ×6 IMPLANT
SUT VIC AB 3-0 SH 27 (SUTURE) ×6
SUT VIC AB 3-0 SH 27X BRD (SUTURE) ×6 IMPLANT
TOWEL GREEN STERILE (TOWEL DISPOSABLE) ×3 IMPLANT
TRAY FOLEY MTR SLVR 16FR STAT (SET/KITS/TRAYS/PACK) ×3 IMPLANT
UNDERPAD 30X36 HEAVY ABSORB (UNDERPADS AND DIAPERS) ×3 IMPLANT
WATER STERILE IRR 1000ML POUR (IV SOLUTION) ×3 IMPLANT

## 2023-03-05 NOTE — Progress Notes (Signed)
Subjective:  No significant overnight events. Patients reports continued rest pain, particularly in the left foot. He experiences some pain relief when sitting upright with his feet on the floor or when lying in bed with his left foot hanging off the bed. He feels ready for surgery today and has not eaten anything overnight.  Objective:  Vital signs in last 24 hours: Vitals:   03/04/23 2015 03/04/23 2200 03/05/23 0315 03/05/23 0736  BP: 111/75 122/84 119/81 (!) 127/90  Pulse: 90 96 89 98  Resp: 16 18 19 18   Temp: 98.8 F (37.1 C) 98.4 F (36.9 C) 98.4 F (36.9 C) 98 F (36.7 C)  TempSrc: Oral Oral Oral Oral  SpO2: 98% 99% 100% 100%  Weight:      Height:       Intake/Output Summary (Last 24 hours) at 03/05/2023 9811 Last data filed at 03/05/2023 0315 Gross per 24 hour  Intake 120 ml  Output 1250 ml  Net -1130 ml   Physical Exam: Constitutional: Middle-aged male lying in bed resting. Appears comfortable and in no acute distress. HENT: Normocephalic and atraumatic.  Neck: Normal range of motion.  Cardiovascular: Regular rate, regular rhythm. 2/6 systolic murmur best appreciated at the left upper sternal border. Mild left lower extremity swelling without pitting. Trace edema of the RLE.  Pulmonary: Normal respiratory effort on room air. No wheezes, rales, rhonchi, or crackles.      Neurological: Alert and oriented to person, place, and time. Skin: Warm and dry. Left heel wound and ulceration on distal end of left second toe unchanged. BLE chronic dry skin changes.  Assessment/Plan:  Principal Problem:   Critical lower limb ischemia (HCC) Active Problems:   Type 2 diabetes mellitus with hyperlipidemia (HCC)   PAD (peripheral artery disease) (HCC)   Stage 3a chronic kidney disease (CKD) (HCC)   Open wound of left foot   [name]. Is a 57 y.o. male living with PAD, HTN, T2DM, CKD stage 3a, CHF, hypothyroidism, and cirrhosis who presented to the ED with foot pain and  left heel wound and admitted for peripheral artery disease and management of left heel wound.   # PAD # Left heel wound S/p angioplasty w/ stent placement of left common iliac artery on 5/1. Common femoral endarterectomy and redo femoral-tibial bypass scheduled for today. Xarelto held for past 48 hours. Will continue holding Xarelto since potentially more upcoming procedures. Regarding possible osteomyelitis, patient remains afebrile without leukocytosis. Will consult orthopedics today for bone biopsy of left calcaneus and evaluation for left second toe amputation. Will continue medical therapy with atorvastatin and aspirin. - Appreciate vascular surgery recommendations - Consult orthopedics for calcaneal heel biopsy/second toe amputation - Atorvastatin 40 mg daily - Aspirin 81 mg daily - Hold Xarelto  # Non-ischemic cardiomyopathy # Pulmonary hypertension # HFrEF, EF 20-25% # CAD Patient received lasix 40 mg yesterday with net negative output of -1.5L. Has mild LLE swelling without pitting and trace RLE edema today. Will hold lasix, Entresto, and spironolactone today given renal function still recovering.  - Hold Lasix 40 mg  - Hold Entresto 24-26 mg BID - Hold spironolactone 12.5 mg daily  - Farxiga 10 mg  - Metoprolol succinate 50 mg daily - Atorvastatin 40 mg - Aspirin 81 mg   # Type 2 Diabetes  Fasting glucose 146 today. Neuropathic pain still present and causing him to have difficulty sleeping. Received 2X dose of oxycodone in past 24 hours. Continue gabapentin 500 mg TID. - Farxiga 10  mg daily - Gabapentin 500 mg TID - Oxycodone 5 mg Q6 PRN  # AKI on CKD stage 3a  Creatinine improving at 1.28 today. Holding lasix, entresto, and spironolactone due to elevated creatinine in the setting of recent angiogram. - Trend BMP  # Hypertension  BP remains stable with MAPs in 80-90s.  - Metoprolol succinate 50 mg daily  Diet: NPO Bowel: Senna VTE: Aspirin (holding Xarelto) IVF:  None.  Code: Full PT/OT recs: none LOS: day 6   Prior to Admission Living Arrangement: home with self Anticipated Discharge Location: TBD Barriers to Discharge: continued management  Whitman Hero, Medical Student 03/05/2023, 9:39 AM

## 2023-03-05 NOTE — Progress Notes (Signed)
[facility] [place]  Subjective  - left foot hurts   Objective 117/74 74 97.7 F (36.5 C) (Oral) 16 99%  Intake/Output Summary (Last 24 hours) at 03/05/2023 1008 Last data filed at 03/05/2023 0315 Gross per 24 hour  Intake 120 ml  Output 1250 ml  Net -1130 ml    Left heel ulcer Left 2nd toe gangrene - dry  Laboratory Lab Results: Recent Labs    03/05/23 0117  WBC 7.3  HGB 11.4*  HCT 35.1*  PLT 173   BMET Recent Labs    03/04/23 0102 03/05/23 0117  NA 130* 133*  K 4.0 4.6  CL 101 102  CO2 20* 24  GLUCOSE 116* 146*  BUN 26* 27*  CREATININE 1.35* 1.28*  CALCIUM 8.7* 8.8*    COAG Lab Results  Component Value Date   INR 1.2 12/12/2021   INR 1.3 (H) 12/07/2021   INR 1.7 (H) 08/27/2019   No results found for: "PTT"  Assessment/Planning:  Discussed plan for left common femoral endarterectomy with redo left fem-tib bypass with plastic.  Possible left second toe amputation.  Risk benefits discussed.  High risk for limb loss.  All questions answered.  Cephus Shelling 03/05/2023 10:08 AM --

## 2023-03-05 NOTE — Anesthesia Procedure Notes (Signed)
Procedure Name: Intubation Date/Time: 03/05/2023 11:01 AM  Performed by: Darlina Guys, CRNAPre-anesthesia Checklist: Patient identified, Emergency Drugs available, Suction available and Patient being monitored Patient Re-evaluated:Patient Re-evaluated prior to induction Oxygen Delivery Method: Circle System Utilized Preoxygenation: Pre-oxygenation with 100% oxygen Induction Type: IV induction Ventilation: Mask ventilation without difficulty Laryngoscope Size: Mac and 4 Grade View: Grade I Tube type: Oral Tube size: 7.5 mm Number of attempts: 1 Airway Equipment and Method: Stylet Placement Confirmation: ETT inserted through vocal cords under direct vision, positive ETCO2 and breath sounds checked- equal and bilateral Secured at: 23 cm Tube secured with: Tape Dental Injury: Teeth and Oropharynx as per pre-operative assessment  Comments: Atraumatic intubation. Lauren Cozart, SRNA placed ETT under direct supervision. CRNA and MDA at bedside.

## 2023-03-05 NOTE — Op Note (Signed)
Date: Mar 05, 2023  Preoperative diagnosis:  Critical limb ischemia of the left lower extremity with tissue loss Occluded left common femoral to posterior tibial artery bypass Left second toe gangrene Left heel ulcer with exposure of tendon  Postoperative diagnosis: Same  Procedure: 1.  Redo exposure of left common femoral artery greater than 30 days 2.  Left common femoral endarterectomy including profundoplasty with bovine pericardial patch angioplasty 3.  Redo left common femoral to posterior tibial bypass with PTFE 4.  Partial left second toe amputation with bone culture 5.  Debridement of left heel wound including skin and subcutaneous tissue (wound measures 4 cm x 4 cm x 1 cm)  Surgeon: Dr. Cephus Shelling, MD  Assistant: Dr. Gillis Santa, MD and Mosetta Pigeon, [place]  Indications: 57 year old male that previously underwent a left common femoral to posterior tibial bypass for tissue loss in 2020.  He has not been seen since 2020.  He was seen in the ED with left second toe gangrene as well as a left heel ulcer with occluded bypass last week.  He underwent angiogram last week showing multilevel occlusive disease.  His left common iliac artery high-grade stenosis was stented last week.  He now presents for left lower extremity revascularization after risks benefits discussed with redo bypass.  An assistant was needed given the complexity of the case.  Findings: Redo exposure of the left common femoral artery through a transverse incision above the inguinal crease.  Common femoral artery and profunda were dissected out as well as the hood of the old bypass.  Posterior tibial was dissected out in the mid calf.  Left common femoral endarterectomy was performed with endarterectomy of the proximal profunda.  We had much better inflow after this and a bovine patch was sewn in the left groin.  PTFE bypass was then sewn from the left common femoral to posterior tibial artery tunneled  subfascial subsartorial.  Brisk PT signal at completion.  The distal PT target was circumferentially calcified and heavily diseased.  After all the clean portion of the case was done and the incisions were closed, a partial left toe amputation was performed with bone biopsy with marginal bleeding.  The toe amputation was closed with 3-0 Nylon.  The left heel necrotic skin and subcutaneous tissue was debrided with wet-to-dry dressing.  Anesthesia: General  Details: Patient was taken to the operating room after informed consent was obtained.  Placed on the operative table in supine position.  General endotracheal anesthesia was induced.  The left groin and left leg and left foot were then prepped and draped in standard sterile fashion.  The left foot was prepped out of the field.  Antibiotics were given and timeout performed.  I initially started in the left groin with a transverse incision above the inguinal crease and dissected down through the subcutaneous tissue and used cerebellar retractors.  The common femoral artery was dissected out as well as the profunda and the previous old bypass.  Dr. Karin Lieu went to the left calf medially and made a longitudinal incision and then dissected out the posterior tibial artery where it was patent on angiogram.  This required reflecting the gastrocnemius and the soleus muscle off the tibia.  We then worked together once we had all of the common femoral and the distal external iliac artery and the profunda dissected out.  I then used a tunneler and tunneled from the posterior tibial target up to the common femoral in a subfascial subsartorial plane  through the popliteal space.  We passed a 6 mm ringed PTFE graft.  The profunda was then controlled with Vesseloops as well as the SFA and we used a vessel loop on the distal external iliac artery after we gave 100 units/kg IV heparin and allowed this to circulate for 2 minutes.  We did check an ACT to confirm this was greater  than 250.  The left common femoral artery was then opened 11 blade scalpel extended with Potts scissors.  The common femoral was heavily calcified with a large posterior plaque into the ostium of the profunda that had high-grade stenosis.  We extended the arteriotomy and removed the previously hood of old bypass.  Penfield elevator was used for extensive endarterectomy including of the common femoral, distal external iliac artery, and profunda using an eversion technique.  We had excellent pulsatile inflow.  A bovine patch was brought on the field and this was sewn in place with 5-0 Prolene parachute technique this was de-aired prior to completion.  We then controlled the common femoral patch with a Satinsky and then opened the anterior wall of this with 11 blade scalpel and Potts scissors and then the graft that already been tunnel was spatulated and end to side anastomosis was sewn to the left common femoral artery.  We had good pulsatile flow distally in the graft.  The leg was then straightened and cut the graft to the appropriate length to the posterior tibial target in the mid calf.  I controlled the graft in the groin with a baby profunda clamp.  We used a tourniquet on the upper thigh after the leg was exsanguinated with an Esmarch.  We went up to 250 mmHg.  The posterior tibial was opened with 11 blade scalpel and Potts scissors.  This was severely diseased artery with circumferential calcification.  Ultimately the graft was spatulated and end to side anastomosis was sewn to the posterior tibial 6-0 Prolene parachute technique.  I did take a number of bites from the adventitia as the level of calcification was severe.  We de-aired the graft prior to completion.  There was an excellent posterior  signal in the foot.  We irrigated out both incisions and these were then closed with multiple layers of 2-0 Vicryl, 3-0 Vicryl, 4-0 Monocryl and Dermabond.  Protamine was given for reversal.  We then closed the  incisions with Dermabond.    Once we completed all the clean portion of the case we turned our attention to the left foot.  There was purulent drainage from the left second toe.  I then performed a fishmouth incision with a 15 blade scalpel and the left second toe was amputated between the phalanges.  I then used a rongeur to take some of the bone back until we got to moderately bleeding tissue.  I did send bone cultures.  The wound was copiously irrigated.  I closed this in multiple 3-0 nylon's with interrupted.  The heel had a large necrotic wound with foul smell.  I then used Metzenbaum scissors and upon further inspection there was frankly necrotic tissue with exposure of the Achilles tendon.  I debrided all the skin and subcutaneous tissue that was frankly necrotic and dead.  The wound measured about 4 cm x 4 cm x 1 cm.  A wet-to-dry dressing was then placed in the heel wound.  We put Xeroform on the toe.  The foot was wrapped with Kerlix and Ace.  Complication: None  Condition: Stable  Cristal Deer  Sondra Barges, MD [facility] [place] Office: [phone]   Cephus Shelling

## 2023-03-05 NOTE — Progress Notes (Signed)
PHARMACIST LIPID MONITORING   Edward Rocha. is a 57 y.o. male admitted on 02/27/2023 with critical lower limb ischemia, T2DM with hyperlipidemia, PAD, Stage 3a chronic kidney disease who presented with an open wound of the left foot.  Pharmacy has been consulted to optimize lipid-lowering therapy with the indication of secondary prevention for clinical ASCVD.  Recent Labs:  Lipid Panel (last 6 months):   Lab Results  Component Value Date   CHOL 113 02/28/2023   TRIG 94 02/28/2023   HDL 39 (L) 02/28/2023   CHOLHDL 2.9 02/28/2023   VLDL 19 02/28/2023   LDLCALC 55 02/28/2023    Hepatic function panel (last 6 months):   Lab Results  Component Value Date   AST 25 02/27/2023   ALT 13 02/27/2023   ALKPHOS 61 02/27/2023   BILITOT 1.4 (H) 02/27/2023    SCr (since admission):   Serum creatinine: 1.28 mg/dL (H) 16/10/96 0454 Estimated creatinine clearance: 71.1 mL/min (A)  Current therapy and lipid therapy tolerance Current lipid-lowering therapy: atorvastatin 40 milligrams, once daily. Review of outpatient prescription dispensing records for 09/26/22 to 02/02/2023 reveals 63% of total days covered indicating high confidence in obtaining his prescription for atorvastatin 40mg  once-daily.  Documented or reported allergies or intolerances to lipid-lowering therapies (if applicable): No  applicable. No documented allergies or intolerance reported.  Assessment:   Patient agrees with changes to lipid-lowering therapy  Plan:    1.Statin intensity (high intensity recommended for all patients regardless of the LDL):  Add or increase statin to high intensity.  2.Add ezetimibe (if any one of the following):   Not indicated at this time.  3.Refer to lipid clinic:   No  4.Follow-up with:  Primary care provider - Bucio, Julian Reil, FNP  5.Follow-up labs after discharge:  Changes in lipid therapy were made. Check a lipid panel in 8-12 weeks then annually.       Elicia Lamp, PharmD,  CPP 03/05/2023, 8:26 PM

## 2023-03-05 NOTE — Anesthesia Postprocedure Evaluation (Signed)
Anesthesia Post Note  Patient: Edward Rocha.  Procedure(s) Performed: LEFT COMMON FEMORAL ENDARTERECTOMY WITH PROFUNDOPLASTY AND PATCH ANGIOPLASTY (Left) LEFT COMMON FEMORAL ARTERY TO POSTERIOR TIBIAL ARTERY BYPASS USING PTFE (Left) REDO EXPOSURE LEFT COMMON FEMORAL ARTERY GREATER THAN 30 DAYS (Left) AMPUTATION LEFT SECOND TOE (Left: Toe) IRRIGATION AND DEBRIDEMENT LEFT HEEL WOUND (Left: Heel)     Patient location during evaluation: PACU Anesthesia Type: General Level of consciousness: awake and alert Pain management: pain level controlled Vital Signs Assessment: post-procedure vital signs reviewed and stable Respiratory status: spontaneous breathing, nonlabored ventilation and respiratory function stable Cardiovascular status: blood pressure returned to baseline and stable Postop Assessment: no apparent nausea or vomiting Anesthetic complications: no   No notable events documented.  Last Vitals:  Vitals:   03/05/23 1600 03/05/23 1603  BP: 97/75 100/71  Pulse: 89 85  Resp: 16 16  Temp:  36.7 C  SpO2: 97% 98%    Last Pain:  Vitals:   03/05/23 1510  TempSrc:   PainSc: 0-No pain                 Lowella Curb

## 2023-03-05 NOTE — Transfer of Care (Signed)
Immediate Anesthesia Transfer of Care Note  Patient: Edward Rocha.  Procedure(s) Performed: LEFT COMMON FEMORAL ENDARTERECTOMY WITH PROFUNDOPLASTY AND PATCH ANGIOPLASTY (Left) LEFT COMMON FEMORAL ARTERY TO POSTERIOR TIBIAL ARTERY BYPASS USING PTFE (Left) REDO EXPOSURE LEFT COMMON FEMORAL ARTERY GREATER THAN 30 DAYS (Left) AMPUTATION LEFT SECOND TOE (Left: Toe) IRRIGATION AND DEBRIDEMENT LEFT HEEL WOUND (Left: Heel)  Patient Location: PACU  Anesthesia Type:General  Level of Consciousness: drowsy and patient cooperative  Airway & Oxygen Therapy: Patient Spontanous Breathing and Patient connected to face mask oxygen  Post-op Assessment: Report given to RN and Post -op Vital signs reviewed and stable  Post vital signs: Reviewed and stable  Last Vitals:  Vitals Value Taken Time  BP 111/80 03/05/23 1510  Temp    Pulse 98 03/05/23 1515  Resp 23 03/05/23 1515  SpO2 96 % 03/05/23 1515  Vitals shown include unvalidated device data.  Last Pain:  Vitals:   03/05/23 1015  TempSrc:   PainSc: 7       Patients Stated Pain Goal: 3 (03/05/23 1015)  Complications: No notable events documented.

## 2023-03-05 NOTE — Care Management Important Message (Signed)
Important Message  Patient Details  Name: Edward Rocha. MRN: [id number] Date of Birth: 1966-03-07   Medicare Important Message Given:  Yes     Renie Ora 03/05/2023, 11:23 AM

## 2023-03-05 NOTE — Progress Notes (Signed)
         Left LE PAD with tissue loss L 2nd toe and ulceration of L heel.   NPO with plan for left common femoral endarterectomy with profundoplasty and redo fem-tib bypass with Dr.Clark  The patient agrees to proceed   Mosetta Pigeon PA-C

## 2023-03-05 NOTE — Plan of Care (Signed)

## 2023-03-05 NOTE — Anesthesia Preprocedure Evaluation (Addendum)
Anesthesia Evaluation  Patient identified by MRN, date of birth, ID band Patient awake    Reviewed: Allergy & Precautions, NPO status , Patient's Chart, lab work & pertinent test results  Airway Mallampati: III  TM Distance: >3 FB Neck ROM: Full    Dental  (+) Edentulous Upper, Edentulous Lower   Pulmonary COPD, former smoker   Pulmonary exam normal breath sounds clear to auscultation       Cardiovascular hypertension, Pt. on home beta blockers + CAD, + Peripheral Vascular Disease and +CHF  Normal cardiovascular exam Rhythm:Regular Rate:Normal     Neuro/Psych negative neurological ROS  negative psych ROS   GI/Hepatic Neg liver ROS,GERD  ,,  Endo/Other  diabetes, Oral Hypoglycemic Agents, Insulin DependentHypothyroidism    Renal/GU Renal InsufficiencyRenal disease     Musculoskeletal negative musculoskeletal ROS (+)    Abdominal   Peds  Hematology  (+) Blood dyscrasia, anemia HLD Thrombocytopenia    Anesthesia Other Findings end stage renal disease  Reproductive/Obstetrics                             Anesthesia Physical Anesthesia Plan  ASA: 3  Anesthesia Plan: General   Post-op Pain Management: Dilaudid IV   Induction: Intravenous  PONV Risk Score and Plan: 2 and Ondansetron, Treatment may vary due to age or medical condition and Midazolam  Airway Management Planned: Oral ETT  Additional Equipment:   Intra-op Plan:   Post-operative Plan: Extubation in OR  Informed Consent: I have reviewed the patients History and Physical, chart, labs and discussed the procedure including the risks, benefits and alternatives for the proposed anesthesia with the patient or authorized representative who has indicated his/her understanding and acceptance.     Dental advisory given  Plan Discussed with: CRNA  Anesthesia Plan Comments:         Anesthesia Quick Evaluation

## 2023-03-05 NOTE — Progress Notes (Signed)
Pharmacy Antibiotic Note  Edward Fittro. is a 57 y.o. male admitted on 02/27/2023 with wound infection s/p  left common femoral endarterectomy with profundoplasty and redo fem-tib bypass. VVS concerned for infection and cultures collected intra-op on 5/6.  Pharmacy has been consulted for Vancomycin and cefepime dosing.  Plan: Cefepime 2gm IV q8h Vancomycin 2000mg  IV x 1, then 1000mg  IV q12h (eAUC  522, Scr 1.28) Flagyl 500mg  IV Q12h Follow up clinical course, renal function, C&S and for de-escalation Levels as needed at steady state.   Height: 6\' 1"  (185.4 cm) Weight: 83.9 kg (185 lb) IBW/kg (Calculated) : 79.9  Temp (24hrs), Avg:98.2 F (36.8 C), Min:97.7 F (36.5 C), Max:98.8 F (37.1 C)  Recent Labs  Lab 02/27/23 0858 02/27/23 1058 02/27/23 1333 02/28/23 0647 03/01/23 0153 03/02/23 0156 03/03/23 0113 03/04/23 0102 03/05/23 0117  WBC 6.0  --   --  6.3 7.6 7.7  --   --  7.3  CREATININE  --   --    < > 1.23 1.10 1.25* 1.58* 1.35* 1.28*  LATICACIDVEN 1.8 1.6  --   --   --   --   --   --   --    < > = values in this interval not displayed.    Estimated Creatinine Clearance: 71.1 mL/min (A) (by C-G formula based on SCr of 1.28 mg/dL (H)).    Allergies  Allergen Reactions   Other Other (See Comments)    Pt received platelets and had a bad reaction from infusion     Antimicrobials this admission: 5/6 cefepime >>  5/6 Vancomycin >> 5/6 Flagyl  >>   Dose adjustments this admission: N/a  Microbiology results: 5/6 Tissue Cx:  5/6 MRSA PCR: Negative  Edward Rocha A. Jeanella Craze, PharmD, BCPS, FNKF Clinical Pharmacist [place] Please utilize Amion for appropriate phone number to reach the unit pharmacist [facility])  03/05/2023 3:18 PM

## 2023-03-06 ENCOUNTER — Encounter (HOSPITAL_COMMUNITY): Payer: Self-pay | Admitting: Vascular Surgery

## 2023-03-06 DIAGNOSIS — M86172 Other acute osteomyelitis, left ankle and foot: Secondary | ICD-10-CM | POA: Diagnosis not present

## 2023-03-06 DIAGNOSIS — I96 Gangrene, not elsewhere classified: Secondary | ICD-10-CM

## 2023-03-06 DIAGNOSIS — I70229 Atherosclerosis of native arteries of extremities with rest pain, unspecified extremity: Secondary | ICD-10-CM | POA: Diagnosis not present

## 2023-03-06 DIAGNOSIS — E1159 Type 2 diabetes mellitus with other circulatory complications: Secondary | ICD-10-CM | POA: Diagnosis not present

## 2023-03-06 DIAGNOSIS — E1152 Type 2 diabetes mellitus with diabetic peripheral angiopathy with gangrene: Secondary | ICD-10-CM | POA: Diagnosis not present

## 2023-03-06 LAB — CBC
HCT: 27.6 % — ABNORMAL LOW (ref 39.0–52.0)
Hemoglobin: 9.2 g/dL — ABNORMAL LOW (ref 13.0–17.0)
MCH: 28.2 pg (ref 26.0–34.0)
MCHC: 33.3 g/dL (ref 30.0–36.0)
MCV: 84.7 fL (ref 80.0–100.0)
Platelets: 158 10*3/uL (ref 150–400)
RBC: 3.26 MIL/uL — ABNORMAL LOW (ref 4.22–5.81)
RDW: 14.9 % (ref 11.5–15.5)
WBC: 15.3 10*3/uL — ABNORMAL HIGH (ref 4.0–10.5)
nRBC: 0 % (ref 0.0–0.2)

## 2023-03-06 LAB — BASIC METABOLIC PANEL
Anion gap: 10 (ref 5–15)
BUN: 31 mg/dL — ABNORMAL HIGH (ref 6–20)
CO2: 18 mmol/L — ABNORMAL LOW (ref 22–32)
Calcium: 8 mg/dL — ABNORMAL LOW (ref 8.9–10.3)
Chloride: 99 mmol/L (ref 98–111)
Creatinine, Ser: 1.33 mg/dL — ABNORMAL HIGH (ref 0.61–1.24)
GFR, Estimated: >60 mL/min (ref >60)
Glucose, Bld: 176 mg/dL — ABNORMAL HIGH (ref 70–99)
Potassium: 4.8 mmol/L (ref 3.5–5.1)
Sodium: 127 mmol/L — ABNORMAL LOW (ref 135–145)

## 2023-03-06 LAB — LIPID PANEL
Cholesterol: 84 mg/dL (ref 0–200)
HDL: 29 mg/dL — ABNORMAL LOW (ref >40)
LDL Cholesterol: 47 mg/dL (ref 0–99)
Total CHOL/HDL Ratio: 2.9 RATIO
Triglycerides: 42 mg/dL (ref <150)
VLDL: 8 mg/dL (ref 0–40)

## 2023-03-06 LAB — AEROBIC/ANAEROBIC CULTURE W GRAM STAIN (SURGICAL/DEEP WOUND)

## 2023-03-06 MED ORDER — CHLORHEXIDINE GLUCONATE CLOTH 2 % EX PADS
6.0000 | MEDICATED_PAD | Freq: Every day | CUTANEOUS | Status: AC
  Administered 2023-03-06 – 2023-03-10 (×5): 6 via TOPICAL

## 2023-03-06 MED ORDER — MUPIROCIN 2 % EX OINT
1.0000 | TOPICAL_OINTMENT | Freq: Two times a day (BID) | CUTANEOUS | Status: AC
  Administered 2023-03-06 – 2023-03-10 (×10): 1 via NASAL
  Filled 2023-03-06: qty 22

## 2023-03-06 NOTE — Progress Notes (Addendum)
  Progress Note    03/06/2023 8:08 AM 1 Day Post-Op  Subjective: no complaints.  Pain controlled with current regimen   Vitals:   03/06/23 0745 03/06/23 0756  BP:  (!) 84/64  Pulse: 93 90  Resp: 19 20  Temp:  97.7 F (36.5 C)  SpO2: 100% 97%   Physical Exam Lungs:  non labored Incisions:  L groin and pop incision c/d/i Extremities:  L PT by doppler; dressing left in place L foot Neurologic: A&O  CBC    Component Value Date/Time   WBC 15.3 (H) 03/06/2023 0330   RBC 3.26 (L) 03/06/2023 0330   HGB 9.2 (L) 03/06/2023 0330   HCT 27.6 (L) 03/06/2023 0330   PLT 158 03/06/2023 0330   MCV 84.7 03/06/2023 0330   MCH 28.2 03/06/2023 0330   MCHC 33.3 03/06/2023 0330   RDW 14.9 03/06/2023 0330   LYMPHSABS 1.1 02/27/2023 0858   MONOABS 0.6 02/27/2023 0858   EOSABS 0.1 02/27/2023 0858   BASOSABS 0.0 02/27/2023 0858    BMET    Component Value Date/Time   NA 127 (L) 03/06/2023 0330   K 4.8 03/06/2023 0330   CL 99 03/06/2023 0330   CO2 18 (L) 03/06/2023 0330   GLUCOSE 176 (H) 03/06/2023 0330   BUN 31 (H) 03/06/2023 0330   CREATININE 1.33 (H) 03/06/2023 0330   CALCIUM 8.0 (L) 03/06/2023 0330   GFRNONAA >60 03/06/2023 0330   GFRAA 54 (L) 10/19/2019 0616    INR    Component Value Date/Time   INR 1.2 12/12/2021 0015     Intake/Output Summary (Last 24 hours) at 03/06/2023 1610 Last data filed at 03/06/2023 0759 Gross per 24 hour  Intake 2455.37 ml  Output 655 ml  Net 1800.37 ml     Assessment/Plan:  57 y.o. male is s/p L femoral to PT redo bypass, L heel debridement, 2nd toe amp 1 Day Post-Op   L foot perfused with brisk PT signal by doppler Dressing left in place; Vascular will change dressing tomorrow OOB, heel weightbearing only Continue to hold Xarelto Continue IV abx and follow culture data    Emilie Rutter, PA-C [facility] [phone] 03/06/2023 8:08 AM  I have seen and evaluated the patient. I agree with the PA note as  documented above.  Postop day 1 status post extensive revascularization of the left lower extremity including common femoral endarterectomy with profundoplasty and bovine pericardial patch and a redo fem-tib bypass with PTFE.  He has a brisk PT signal at the ankle.  Partial left second toe amputation performed with bone culture showing gram-positive cocci.  We debrided part of his heel but this has exposure of the Achilles tendon even before debridement.  Discussed with him that my experience is getting any heel wound with tendon exposure healed is very difficult.  I think he remains very high risk for limb loss.  Remains on cefepime Vanc Flagyl for broad-spectrum antibiotics as there was purulent drainage from the toe and a necrotic heel ulcer.  Dr. Lajoyce Corners seeing the patient for his heel ulcer which is much appreciated.  Cephus Shelling, MD [facility] [place] Office: [phone]

## 2023-03-06 NOTE — Progress Notes (Signed)
Subjective:  No significant overnight events. Patient feeling well after surgery. He reports the current pain control regimen is adequate. He denies shortness of breath or chest pain.   Objective:  Vital signs in last 24 hours: Vitals:   03/06/23 0400 03/06/23 0730 03/06/23 0745 03/06/23 0756  BP: 97/70 92/64  (!) 84/64  Pulse: 85 86 93 90  Resp: 17 16 19 20   Temp:    97.7 F (36.5 C)  TempSrc: Oral   Oral  SpO2: 97% 100% 100% 97%  Weight:      Height:       Intake/Output Summary (Last 24 hours) at 03/06/2023 1043 Last data filed at 03/06/2023 1914 Gross per 24 hour  Intake 2695.37 ml  Output 655 ml  Net 2040.37 ml   Physical Exam: Constitutional: Middle-aged male sitting up in bed. Appears comfortable and in no acute distress. Cardiovascular: Regular rate, regular rhythm. 2/6 systolic murmur best appreciated at the left upper sternal border. +1 pitting edema of LLE. No RLE edema. Pulmonary: Normal respiratory effort. No wheezes, rales, rhonchi, or crackles.   Abdominal: Soft. Non-distended. Normal bowel sounds.    Neurological: Alert and oriented to person, place, and time. Skin: Warm and dry. Popliteal incision without drainage or erythema. Dressing on L foot remains in place. BLE chronic dry skin changes.   Assessment/Plan:  Principal Problem:   Critical lower limb ischemia (HCC) Active Problems:   Type 2 diabetes mellitus with circulatory disorder (HCC)   PAD (peripheral artery disease) (HCC)   Stage 3a chronic kidney disease (CKD) (HCC)   Open wound of left foot   Gangrene of left foot (HCC)  Edward Rocha. Is a 57 y.o. male living with PAD, HTN, T2DM, CKD stage 3a, CHF, hypothyroidism, and cirrhosis who presented to the ED with foot pain and left heel wound and admitted for peripheral artery disease and management of left heel wound.   # PAD # Left heel wound S/p angioplasty with stent placement of left common iliac artery on 5/1. S/p L common femoral  endarterectomy, redo femoral-tibial bypass, partial L second toe amputation, and debridement of L heel wound on 5/6. Vascular concerned for wound infection given intraoperative findings. Pharmacy consulted for dosing of cefepime, vancomycin, and Flagyl. Orthopedics planning for surgical debridement of L heel wound on 5/8 or 5/10. Will continue holding Xarelto. Will continue medical therapy with atorvastatin and aspirin. - Appreciate vascular recommendations - Appreciate orthopedics recommendations - Antibiotics (cefepime, vancomycin, Flagyl) per pharmacy - Culture of toe bone biopsy pending - Hold Xarelto - Atorvastatin 80 mg - Aspirin 81 mg  # Non-ischemic cardiomyopathy # Pulmonary hypertension # HFrEF, EF 20-25% # CAD Has +1 pitting edema of LLE and no edema of RLE. Will continue holding Lasix, Entresto, and spironolactone given renal function still recovering. - Hold Lasix 40 mg  - Hold Entresto 24-26 mg BID - Hold spironolactone 12.5 mg daily  - Farxiga 10 mg  - Metoprolol succinate 50 mg daily - Atorvastatin 80 mg - Aspirin 81 mg   # Type 2 Diabetes  Glucose 176 today. Neuropathic pain now well-controlled with post-op pain medications. Will continue gabapentin 500 mg TID as other pain medications will eventually be weaned. - Farxiga 10 mg daily - Gabapentin 500 mg TID - Oxycodone 5 mg Q6 PRN - Percocet 5-325 mg Q4 PRN - IV Dilaudid 0.5-1 mg Q2 PRN  # AKI on CKD stage 3a  Creatinine remains elevated from baseline at 1.33 today. Will continue holding Lasix,  Entresto, and spironolactone. - Trend BMP   # Hypertension  BP remains stable with MAPs in 70-80s this morning. - Metoprolol succinate 50 mg daily   Edward Rocha, Medical Student 03/06/2023, 10:43 AM

## 2023-03-06 NOTE — Progress Notes (Signed)
Orthopedic Tech Progress Note Patient Details:  Edward Rocha [birth year] [id number]  Called in order this morning to [employer] for a OFFLOADING HEEL SHOE   Patient ID: Edward Mealing., male   DOB: [birth year], 57 y.o.   MRN: [id number]  Edward Rocha 03/06/2023, 8:59 AM

## 2023-03-06 NOTE — Plan of Care (Signed)

## 2023-03-06 NOTE — H&P (View-Only) (Signed)
 ORTHOPAEDIC CONSULTATION  REQUESTING PHYSICIAN: Hatcher, Jeffrey C, MD  Chief Complaint: Gangrenous ulcer left heel.  HPI: Edward M Klebba Jr. is a 56 y.o. male who presents with peripheral vascular disease status post revascularization about 4 years ago with extensive revascularization yesterday.  Patient had a gangrenous second toe that was amputated and a large necrotic wound over the Achilles.  Past Medical History:  Diagnosis Date   Acute on chronic renal failure (HCC)    Essential hypertension    GERD (gastroesophageal reflux disease)    Hyperosmolar syndrome 2020   Hypothyroidism    Mild coronary artery disease    Cardiac catheterization September 2022 - [facility]   Nonischemic cardiomyopathy (HCC)    PAD (peripheral artery disease) (HCC)    Thrombocytopenia (HCC)    Type 2 diabetes mellitus (HCC)    Past Surgical History:  Procedure Laterality Date   ABDOMINAL AORTOGRAM W/LOWER EXTREMITY N/A 02/06/2019   Procedure: ABDOMINAL AORTOGRAM W/LOWER EXTREMITY;  Surgeon: Clark, Christopher J, MD;  [facility];  Service: Cardiovascular;  Laterality: N/A;  bilateral   ABDOMINAL AORTOGRAM W/LOWER EXTREMITY N/A 04/30/2019   Procedure: ABDOMINAL AORTOGRAM W/LOWER EXTREMITY;  Surgeon: Clark, Christopher J, MD;  [facility];  Service: Cardiovascular;  Laterality: N/A;   ABDOMINAL AORTOGRAM W/LOWER EXTREMITY N/A 02/28/2023   Procedure: ABDOMINAL AORTOGRAM W/LOWER EXTREMITY;  Surgeon: Clark, Christopher J, MD;  [facility];  Service: Cardiovascular;  Laterality: N/A;   AORTOGRAM  08/11/2019   Procedure: Aortogram;  Surgeon: Clark, Christopher J, MD;  [facility];  Service: Vascular;;   ESOPHAGEAL BANDING  08/22/2019   Procedure: ESOPHAGEAL BANDING;  Surgeon: Jacobs, Daniel P, MD;  [facility];  Service: Endoscopy;;   ESOPHAGOGASTRODUODENOSCOPY N/A 08/22/2019   Procedure: ESOPHAGOGASTRODUODENOSCOPY (EGD);  Surgeon: Jacobs, Daniel P,  MD;  [facility];  Service: Endoscopy;  Laterality: N/A;   FEMORAL-POPLITEAL BYPASS GRAFT Left 08/11/2019   Procedure: BYPASS LEFT FEMORAL-DISTAL POPLITEAL ARTERY USING PROPATEN GRAFT;  Surgeon: Clark, Christopher J, MD;  [facility];  Service: Vascular;  Laterality: Left;   INSERTION OF DIALYSIS CATHETER Right 09/09/2019   Procedure: INSERTION OF DIALYSIS CATHETER;  Surgeon: Cain, Brandon Christopher, MD;  [facility];  Service: Vascular;  Laterality: Right;   MULTIPLE TOOTH EXTRACTIONS     PERIPHERAL VASCULAR INTERVENTION  02/06/2019   Procedure: PERIPHERAL VASCULAR INTERVENTION;  Surgeon: Clark, Christopher J, MD;  [facility];  Service: Cardiovascular;;  Bilateral Iliacs   PERIPHERAL VASCULAR INTERVENTION  04/30/2019   Procedure: PERIPHERAL VASCULAR INTERVENTION;  Surgeon: Clark, Christopher J, MD;  [facility];  Service: Cardiovascular;;  Stent - Lt. Iliac    REMOVAL OF A DIALYSIS CATHETER Right 09/09/2019   Procedure: Removal Of A Dialysis Catheter;  Surgeon: Cain, Brandon Christopher, MD;  [facility];  Service: Vascular;  Laterality: Right;   RIGHT HEART CATH N/A 12/12/2021   Procedure: RIGHT HEART CATH;  Surgeon: McLean, Dalton S, MD;  [facility];  Service: Cardiovascular;  Laterality: N/A;   ULTRASOUND GUIDANCE FOR VASCULAR ACCESS Right 09/09/2019   Procedure: Ultrasound Guidance For Vascular Access;  Surgeon: Cain, Brandon Christopher, MD;  [facility];  Service: Vascular;  Laterality: Right;   VASCULAR SURGERY     Social History   Socioeconomic History   Marital status: Married    Spouse name: Not on file   Number of children: 2   Years of education: Not on file     Highest education level: Not on file  Occupational History   Not on file  Tobacco Use   Smoking status: Former    Packs/day: 1.00    Years: 36.00    Additional pack years: 0.00    Total pack years: 36.00    Types: Cigarettes    Quit  date: 01/25/2019    Years since quitting: 4.1    Passive exposure: Never   Smokeless tobacco: Never  Vaping Use   Vaping Use: Never used  Substance and Sexual Activity   Alcohol use: Not Currently    Comment: per pt's wife, pt is an alcoholic but just recently stopped drinking in 04/2019   Drug use: Never   Sexual activity: Not on file  Other Topics Concern   Not on file  Social History Narrative   Not on file   Social Determinants of Health   Financial Resource Strain: Low Risk  (09/21/2019)   Overall Financial Resource Strain (CARDIA)    Difficulty of Paying Living Expenses: Not very hard  Food Insecurity: No Food Insecurity (02/27/2023)   Hunger Vital Sign    Worried About Running Out of Food in the Last Year: Never true    Ran Out of Food in the Last Year: Never true  Transportation Needs: No Transportation Needs (02/27/2023)   PRAPARE - Transportation    Lack of Transportation (Medical): No    Lack of Transportation (Non-Medical): No  Physical Activity: Insufficiently Active (09/21/2019)   Exercise Vital Sign    Days of Exercise per Week: 7 days    Minutes of Exercise per Session: 20 min  Stress: No Stress Concern Present (09/21/2019)   [employer] - Occupational Stress Questionnaire    Feeling of Stress : Only a little  Social Connections: Moderately Isolated (09/21/2019)   Social Connection and Isolation Panel [NHANES]    Frequency of Communication with Friends and Family: More than three times a week    Frequency of Social Gatherings with Friends and Family: Three times a week    Attends Religious Services: Never    Active Member of Clubs or Organizations: No    Attends Club or Organization Meetings: Never    Marital Status: Married   Family History  Problem Relation Age of Onset   Alcohol abuse Father    Cancer Mother    Alcohol abuse Brother    Bipolar disorder Son    Bipolar disorder Daughter    - negative except otherwise  stated in the family history section Allergies  Allergen Reactions   Other Other (See Comments)    Pt received platelets and had a bad reaction from infusion    Prior to Admission medications   Medication Sig Start Date End Date Taking? Authorizing Provider  acetaminophen (TYLENOL) 500 MG tablet Take 1 tablet (500 mg total) by mouth daily as needed for moderate pain or headache. Patient taking differently: Take 1,000 mg by mouth daily as needed for moderate pain or headache. 12/19/21  Yes Joseph, Preetha, MD  aspirin EC 81 MG tablet Take 81 mg by mouth daily.   Yes [provider]  atorvastatin (LIPITOR) 40 MG tablet Take 1 tablet (40 mg total) by mouth daily. Patient taking differently: Take 40 mg by mouth at bedtime. 08/14/22  Yes McDowell, Samuel G, MD  dapagliflozin propanediol (FARXIGA) 10 MG TABS tablet Take 1 tablet (10 mg total) by mouth daily. Patient taking differently: Take 10 mg by mouth at bedtime. 08/14/22  Yes McDowell, Samuel   G, MD  ergocalciferol (VITAMIN D2) 1.25 MG (50000 UT) capsule Take 50,000 Units by mouth every Monday.   Yes [provider]  furosemide (LASIX) 40 MG tablet Take 1 tablet (40 mg total) by mouth daily. Patient taking differently: Take 40 mg by mouth at bedtime. 08/14/22  Yes McDowell, Samuel G, MD  gabapentin (NEURONTIN) 300 MG capsule Take 300 mg by mouth See admin instructions. 300 mg at bedtime. May take up to 3 times a day as needed for pain.   Yes [provider]  metoprolol succinate (TOPROL XL) 50 MG 24 hr tablet Take 1 tablet (50 mg total) by mouth daily. Patient taking differently: Take 50 mg by mouth at bedtime. 11/02/22  Yes Peck, Elizabeth, NP  nitroGLYCERIN (NITROSTAT) 0.4 MG SL tablet Place 0.4 mg under the tongue every 5 (five) minutes as needed for chest pain.   Yes [provider]  sacubitril-valsartan (ENTRESTO) 24-26 MG Take 1 tablet by mouth 2 (two) times daily. 11/02/22  Yes Peck, Elizabeth, NP   spironolactone (ALDACTONE) 25 MG tablet Take 0.5 tablets (12.5 mg total) by mouth daily. Patient taking differently: Take 25 mg by mouth at bedtime. 08/14/22  Yes McDowell, Samuel G, MD  Elastic Bandages & Supports (KNEE SUPPORT/ELASTIC/FIRM MED) MISC 1 each by Does not apply route as directed. Medium pressure compression stockings Dx: leg edema 01/13/22   McDowell, Samuel G, MD   No results found. - pertinent xrays, CT, MRI studies were reviewed and independently interpreted  Positive ROS: All other systems have been reviewed and were otherwise negative with the exception of those mentioned in the HPI and as above.  Physical Exam: General: Alert, no acute distress Psychiatric: Patient is competent for consent with normal mood and affect Lymphatic: No axillary or cervical lymphadenopathy Cardiovascular: No pedal edema Respiratory: No cyanosis, no use of accessory musculature GI: No organomegaly, abdomen is soft and non-tender    Images:  @ENCIMAGES@  Labs:  Lab Results  Component Value Date   HGBA1C 6.5 (H) 02/27/2023   HGBA1C 6.9 (H) 12/07/2021   HGBA1C 5.3 10/18/2019   ESRSEDRATE 62 (H) 02/28/2023   ESRSEDRATE 85 (H) 05/29/2019   CRP 1.8 (H) 02/28/2023   REPTSTATUS PENDING 03/05/2023   GRAMSTAIN  03/05/2023    NO WBC SEEN RARE GRAM POSITIVE COCCI IN PAIRS Performed at [facility], [address] [place], [place] [address]    CULT PENDING 03/05/2023    Lab Results  Component Value Date   ALBUMIN 3.1 (L) 02/27/2023   ALBUMIN 2.2 (L) 12/13/2021   ALBUMIN 2.0 (L) 12/12/2021        Latest Ref Rng & Units 03/06/2023    3:30 AM 03/05/2023    1:17 AM 03/02/2023    1:56 AM  CBC EXTENDED  WBC 4.0 - 10.5 K/uL 15.3  7.3  7.7   RBC 4.22 - 5.81 MIL/uL 3.26  4.10  4.50   Hemoglobin 13.0 - 17.0 g/dL 9.2  11.4  12.5   HCT 39.0 - 52.0 % 27.6  35.1  38.0   Platelets 150 - 400 K/uL 158  173  164     Neurologic: Patient does not have protective sensation bilateral  lower extremities.   MUSCULOSKELETAL:   Skin: Examination of the second toe amputation is stable no cellulitis no wound dehiscence.  Patient has a large necrotic wound over the left Achilles and calcaneus.  White cell count 15.3 with a hemoglobin of 9.2.  Albumin 3.1  Review of the   MRI scan shows full-thickness ulceration down to the Achilles with edema within the mid substance of the calcaneus.  This may be early osteomyelitis.  Assessment: Assessment: Status post revascularization left lower extremity with full-thickness ulceration to the distal Achilles and possible calcaneal osteomyelitis.  Plan: Plan: Will plan for surgical debridement on Wednesday or Friday depending on the OR availability.  Risks and benefits are discussed including potential for amputation.  Thank you for the consult and the opportunity to see Edward Rocha  Brazen Domangue, MD [facility] [phone] 8:40 AM      

## 2023-03-06 NOTE — Consult Note (Signed)
ORTHOPAEDIC CONSULTATION  REQUESTING PHYSICIAN: Ginnie Smart, MD  Chief Complaint: Gangrenous ulcer left heel.  HPI: Edward Rocha. is a 57 y.o. male who presents with peripheral vascular disease status post revascularization about 4 years ago with extensive revascularization yesterday.  Patient had a gangrenous second toe that was amputated and a large necrotic wound over the Achilles.  Past Medical History:  Diagnosis Date   Acute on chronic renal failure (HCC)    Essential hypertension    GERD (gastroesophageal reflux disease)    Hyperosmolar syndrome 2020   Hypothyroidism    Mild coronary artery disease    Cardiac catheterization September 2022 - [facility]   Nonischemic cardiomyopathy [facility])    PAD (peripheral artery disease) (HCC)    Thrombocytopenia (HCC)    Type 2 diabetes mellitus (HCC)    Past Surgical History:  Procedure Laterality Date   ABDOMINAL AORTOGRAM W/LOWER EXTREMITY N/A 02/06/2019   Procedure: ABDOMINAL AORTOGRAM W/LOWER EXTREMITY;  Surgeon: Cephus Shelling, MD;  [facility];  Service: Cardiovascular;  Laterality: N/A;  bilateral   ABDOMINAL AORTOGRAM W/LOWER EXTREMITY N/A 04/30/2019   Procedure: ABDOMINAL AORTOGRAM W/LOWER EXTREMITY;  Surgeon: Cephus Shelling, MD;  [facility];  Service: Cardiovascular;  Laterality: N/A;   ABDOMINAL AORTOGRAM W/LOWER EXTREMITY N/A 02/28/2023   Procedure: ABDOMINAL AORTOGRAM W/LOWER EXTREMITY;  Surgeon: Cephus Shelling, MD;  [facility];  Service: Cardiovascular;  Laterality: N/A;   AORTOGRAM  08/11/2019   Procedure: Aortogram;  Surgeon: Cephus Shelling, MD;  Location: [facility] OR;  Service: Vascular;;   ESOPHAGEAL BANDING  08/22/2019   Procedure: ESOPHAGEAL BANDING;  Surgeon: Rachael Fee, MD;  Location: [facility] ENDOSCOPY;  Service: Endoscopy;;   ESOPHAGOGASTRODUODENOSCOPY N/A 08/22/2019   Procedure: ESOPHAGOGASTRODUODENOSCOPY (EGD);  Surgeon: Rachael Fee,  MD;  Location: [facility] ENDOSCOPY;  Service: Endoscopy;  Laterality: N/A;   FEMORAL-POPLITEAL BYPASS GRAFT Left 08/11/2019   Procedure: BYPASS LEFT FEMORAL-DISTAL POPLITEAL ARTERY USING PROPATEN GRAFT;  Surgeon: Cephus Shelling, MD;  Location: [facility] OR;  Service: Vascular;  Laterality: Left;   INSERTION OF DIALYSIS CATHETER Right 09/09/2019   Procedure: INSERTION OF DIALYSIS CATHETER;  Surgeon: Maeola Harman, MD;  Location: [facility] OR;  Service: Vascular;  Laterality: Right;   MULTIPLE TOOTH EXTRACTIONS     PERIPHERAL VASCULAR INTERVENTION  02/06/2019   Procedure: PERIPHERAL VASCULAR INTERVENTION;  Surgeon: Cephus Shelling, MD;  [facility];  Service: Cardiovascular;;  Bilateral Iliacs   PERIPHERAL VASCULAR INTERVENTION  04/30/2019   Procedure: PERIPHERAL VASCULAR INTERVENTION;  Surgeon: Cephus Shelling, MD;  [facility];  Service: Cardiovascular;;  Stent - Lt. Iliac    REMOVAL OF A DIALYSIS CATHETER Right 09/09/2019   Procedure: Removal Of A Dialysis Catheter;  Surgeon: Maeola Harman, MD;  Location: [facility] OR;  Service: Vascular;  Laterality: Right;   RIGHT HEART CATH N/A 12/12/2021   Procedure: RIGHT HEART CATH;  Surgeon: Laurey Morale, MD;  Location: [facility] INVASIVE CV LAB;  Service: Cardiovascular;  Laterality: N/A;   ULTRASOUND GUIDANCE FOR VASCULAR ACCESS Right 09/09/2019   Procedure: Ultrasound Guidance For Vascular Access;  Surgeon: Maeola Harman, MD;  Location: [facility] OR;  Service: Vascular;  Laterality: Right;   VASCULAR SURGERY     Social History   Socioeconomic History   Marital status: Married    Spouse name: Not on file   Number of children: 2   Years of education: Not on file  Highest education level: Not on file  Occupational History   Not on file  Tobacco Use   Smoking status: Former    Packs/day: 1.00    Years: 36.00    Additional pack years: 0.00    Total pack years: 36.00    Types: Cigarettes    Quit  date: 01/25/2019    Years since quitting: 4.1    Passive exposure: Never   Smokeless tobacco: Never  Vaping Use   Vaping Use: Never used  Substance and Sexual Activity   Alcohol use: Not Currently    Comment: per pt's wife, pt is an alcoholic but just recently stopped drinking in 04/2019   Drug use: Never   Sexual activity: Not on file  Other Topics Concern   Not on file  Social History Narrative   Not on file   Social Determinants of Health   Financial Resource Strain: Low Risk  (09/21/2019)   Overall Financial Resource Strain (CARDIA)    Difficulty of Paying Living Expenses: Not very hard  Food Insecurity: No Food Insecurity (02/27/2023)   Hunger Vital Sign    Worried About Running Out of Food in the Last Year: Never true    Ran Out of Food in the Last Year: Never true  Transportation Needs: No Transportation Needs (02/27/2023)   PRAPARE - Administrator, Civil Service (Medical): No    Lack of Transportation (Non-Medical): No  Physical Activity: Insufficiently Active (09/21/2019)   Exercise Vital Sign    Days of Exercise per Week: 7 days    Minutes of Exercise per Session: 20 min  Stress: No Stress Concern Present (09/21/2019)   [employer] of Occupational [employer] - Occupational Stress Questionnaire    Feeling of Stress : Only a little  Social Connections: Moderately Isolated (09/21/2019)   Social Connection and Isolation Panel [NHANES]    Frequency of Communication with Friends and Family: More than three times a week    Frequency of Social Gatherings with Friends and Family: Three times a week    Attends Religious Services: Never    Active Member of Clubs or Organizations: No    Attends Engineer, structural: Never    Marital Status: Married   Family History  Problem Relation Age of Onset   Alcohol abuse Father    Cancer Mother    Alcohol abuse Brother    Bipolar disorder Son    Bipolar disorder Daughter    - negative except otherwise  stated in the family history section Allergies  Allergen Reactions   Other Other (See Comments)    Pt received platelets and had a bad reaction from infusion    Prior to Admission medications   Medication Sig Start Date End Date Taking? Authorizing Provider  acetaminophen (TYLENOL) 500 MG tablet Take 1 tablet (500 mg total) by mouth daily as needed for moderate pain or headache. Patient taking differently: Take 1,000 mg by mouth daily as needed for moderate pain or headache. 12/19/21  Yes Zannie Cove, MD  aspirin EC 81 MG tablet Take 81 mg by mouth daily.   Yes [provider]  atorvastatin (LIPITOR) 40 MG tablet Take 1 tablet (40 mg total) by mouth daily. Patient taking differently: Take 40 mg by mouth at bedtime. 08/14/22  Yes Jonelle Sidle, MD  dapagliflozin propanediol (FARXIGA) 10 MG TABS tablet Take 1 tablet (10 mg total) by mouth daily. Patient taking differently: Take 10 mg by mouth at bedtime. 08/14/22  Yes Nona Dell  G, MD  ergocalciferol (VITAMIN D2) 1.25 MG (50000 UT) capsule Take 50,000 Units by mouth every Monday.   Yes [provider]  furosemide (LASIX) 40 MG tablet Take 1 tablet (40 mg total) by mouth daily. Patient taking differently: Take 40 mg by mouth at bedtime. 08/14/22  Yes Jonelle Sidle, MD  gabapentin (NEURONTIN) 300 MG capsule Take 300 mg by mouth See admin instructions. 300 mg at bedtime. May take up to 3 times a day as needed for pain.   Yes [provider]  metoprolol succinate (TOPROL XL) 50 MG 24 hr tablet Take 1 tablet (50 mg total) by mouth daily. Patient taking differently: Take 50 mg by mouth at bedtime. 11/02/22  Yes Sharlene Dory, NP  nitroGLYCERIN (NITROSTAT) 0.4 MG SL tablet Place 0.4 mg under the tongue every 5 (five) minutes as needed for chest pain.   Yes [provider]  sacubitril-valsartan (ENTRESTO) 24-26 MG Take 1 tablet by mouth 2 (two) times daily. 11/02/22  Yes Sharlene Dory, NP   spironolactone (ALDACTONE) 25 MG tablet Take 0.5 tablets (12.5 mg total) by mouth daily. Patient taking differently: Take 25 mg by mouth at bedtime. 08/14/22  Yes Jonelle Sidle, MD  Elastic Bandages & Supports (KNEE SUPPORT/ELASTIC/FIRM MED) MISC 1 each by Does not apply route as directed. Medium pressure compression stockings Dx: leg edema 01/13/22   Jonelle Sidle, MD   No results found. - pertinent xrays, CT, MRI studies were reviewed and independently interpreted  Positive ROS: All other systems have been reviewed and were otherwise negative with the exception of those mentioned in the HPI and as above.  Physical Exam: General: Alert, no acute distress Psychiatric: Patient is competent for consent with normal mood and affect Lymphatic: No axillary or cervical lymphadenopathy Cardiovascular: No pedal edema Respiratory: No cyanosis, no use of accessory musculature GI: No organomegaly, abdomen is soft and non-tender    Images:  @ENCIMAGES @  Labs:  Lab Results  Component Value Date   HGBA1C 6.5 (H) 02/27/2023   HGBA1C 6.9 (H) 12/07/2021   HGBA1C 5.3 10/18/2019   ESRSEDRATE 62 (H) 02/28/2023   ESRSEDRATE 85 (H) 05/29/2019   CRP 1.8 (H) 02/28/2023   REPTSTATUS PENDING 03/05/2023   GRAMSTAIN  03/05/2023    NO WBC SEEN RARE GRAM POSITIVE COCCI IN PAIRS Performed at [facility], [address] [place], [place] [address]    CULT PENDING 03/05/2023    Lab Results  Component Value Date   ALBUMIN 3.1 (L) 02/27/2023   ALBUMIN 2.2 (L) 12/13/2021   ALBUMIN 2.0 (L) 12/12/2021        Latest Ref Rng & Units 03/06/2023    3:30 AM 03/05/2023    1:17 AM 03/02/2023    1:56 AM  CBC EXTENDED  WBC 4.0 - 10.5 K/uL 15.3  7.3  7.7   RBC 4.22 - 5.81 MIL/uL 3.26  4.10  4.50   Hemoglobin 13.0 - 17.0 g/dL 9.2  53.6  64.4   HCT 03.4 - 52.0 % 27.6  35.1  38.0   Platelets 150 - 400 K/uL 158  173  164     Neurologic: Patient does not have protective sensation bilateral  lower extremities.   MUSCULOSKELETAL:   Skin: Examination of the second toe amputation is stable no cellulitis no wound dehiscence.  Patient has a large necrotic wound over the left Achilles and calcaneus.  White cell count 15.3 with a hemoglobin of 9.2.  Albumin 3.1  Review of the  MRI scan shows full-thickness ulceration down to the Achilles with edema within the mid substance of the calcaneus.  This may be early osteomyelitis.  Assessment: Assessment: Status post revascularization left lower extremity with full-thickness ulceration to the distal Achilles and possible calcaneal osteomyelitis.  Plan: Plan: Will plan for surgical debridement on Wednesday or Friday depending on the OR availability.  Risks and benefits are discussed including potential for amputation.  Thank you for the consult and the opportunity to see Mr. Fausto Merrick, MD [facility] [phone] 8:40 AM

## 2023-03-06 NOTE — Evaluation (Addendum)
Physical Therapy Re-Evaluation Patient Details Name: Edward Rocha. MRN: [id number] DOB: Sep 14, 1966 Today's Date: 03/06/2023  History of Present Illness  Pt is a 57 year old male admitted on 02/27/23 with foot pain and left heel wound. S/p 03/05/23 Redo exposure of left common femoral artery, left common femoral endarterectomy,redo left common femoral to posterior tibial bypass, partial left second toe amputation, debridement of left heel woundPMH significiant for CAD, PAD, HTN, T2DM, CKD3a, HFrEF, hypothyroidism, and cirrhosis.  Clinical Impression  Pt mobility limited by new weightbearing orders for weightbearing only through heel. Orthowedge shoe to facilitate only L heel weightbearing had not yet been delivered to room at time of Re-Evaluation. Pt able to mobilize with increased UE use with RW to perform transfer from bed to chair. Pt requiring increased cuing to keep weight off toes so mobilization limited to transfer. Pt will benefit from continued acute and then post acute PT to progress mobility.        Recommendations for follow up therapy are one component of a multi-disciplinary discharge planning process, led by the attending physician.  Recommendations may be updated based on patient status, additional functional criteria and insurance authorization.     Assistance Recommended at Discharge PRN  Patient can return home with the following  A little help with walking and/or transfers;A little help with bathing/dressing/bathroom;Assistance with cooking/housework;Direct supervision/assist for medications management;Assist for transportation;Help with stairs or ramp for entrance    Equipment Recommendations None recommended by PT     Functional Status Assessment Patient has had a recent decline in their functional status and demonstrates the ability to make significant improvements in function in a reasonable and predictable amount of time.     Precautions / Restrictions  Precautions Precautions: Fall Restrictions Weight Bearing Restrictions: Yes, Weightbearing through L heel, Darco Orthowedge to offload L toes      Mobility  Bed Mobility Overal bed mobility: Modified Independent             General bed mobility comments: use of bed rail to pull to EoB    Transfers Overall transfer level: Needs assistance Equipment used: Rolling walker (2 wheels) Transfers: Sit to/from Stand, Bed to chair/wheelchair/BSC Sit to Stand: Min guard   Step pivot transfers: Min guard       General transfer comment: min guard for safety, vc for increased UE use to limit weightbearing to only through heel    Ambulation/Gait               General Gait Details: deferred due to incorrect orthopedic shoe in room      Balance Overall balance assessment: Mild deficits observed, not formally tested                                           Pertinent Vitals/Pain Pain Assessment Pain Assessment: 0-10 Pain Score: 6  Pain Location: B Feet. L foot worse than R Pain Descriptors / Indicators: Discomfort, Grimacing, Constant, Sore Pain Intervention(s): Limited activity within patient's tolerance, Monitored during session, Repositioned    Home Living Family/patient expects to be discharged to:: Private residence Living Arrangements: Alone Available Help at Discharge: Neighbor;Family;Available PRN/intermittently Type of Home: House Home Access: Stairs to enter Entrance Stairs-Rails: None Entrance Stairs-Number of Steps: 1   Home Layout: One level Home Equipment: Agricultural consultant (2 wheels);Cane - single point;Hand held shower head;Wheelchair - manual;Wheelchair - power  Prior Function Prior Level of Function : Independent/Modified Independent;Driving             Mobility Comments: Mod I RW; uses cane for community mobility ADLs Comments: Ind     Hand Dominance   Dominant Hand: Right       Communication   Communication:  No difficulties  Cognition Arousal/Alertness: Awake/alert Behavior During Therapy: WFL for tasks assessed/performed Overall Cognitive Status: Within Functional Limits for tasks assessed                                          General Comments General comments (skin integrity, edema, etc.): VSS on RA        Assessment/Plan    PT Assessment Patient needs continued PT services  PT Problem List Decreased strength;Decreased balance;Decreased mobility;Decreased knowledge of use of DME;Decreased skin integrity;Impaired sensation;Pain       PT Treatment Interventions DME instruction;Gait training;Stair training;Functional mobility training;Therapeutic activities;Therapeutic exercise;Balance training;Cognitive remediation;Patient/family education    PT Goals (Current goals can be found in the Care Plan section)  Acute Rehab PT Goals Patient Stated Goal: to go home PT Goal Formulation: With patient Time For Goal Achievement: 03/15/23 Potential to Achieve Goals: Good    Frequency Min 1X/week        AM-PAC PT "6 Clicks" Mobility  Outcome Measure Help needed turning from your back to your side while in a flat bed without using bedrails?: None Help needed moving from lying on your back to sitting on the side of a flat bed without using bedrails?: None Help needed moving to and from a bed to a chair (including a wheelchair)?: None Help needed standing up from a chair using your arms (e.g., wheelchair or bedside chair)?: None Help needed to walk in hospital room?: None Help needed climbing 3-5 steps with a railing? : A Little 6 Click Score: 23    End of Session Equipment Utilized During Treatment: Gait belt Activity Tolerance: Patient tolerated treatment well Patient left: in bed;with call bell/phone within reach;in chair Nurse Communication: Mobility status PT Visit Diagnosis: Other abnormalities of gait and mobility (R26.89);Pain Pain - Right/Left: Left Pain -  part of body: Ankle and joints of foot    Time: 4098-1191 PT Time Calculation (min) (ACUTE ONLY): 24 min   Charges:   PT Evaluation $PT Re-evaluation: 1 Re-eval PT Treatments $Therapeutic Activity: 8-22 mins        Edward Rocha PT, DPT Acute Rehabilitation Services Please use secure chat or  Call Office [phone]   Elon Alas [facility] 03/06/2023, 3:38 PM

## 2023-03-06 NOTE — Evaluation (Signed)
Occupational Therapy Evaluation Patient Details Name: Edward Rocha. MRN: [id number] DOB: 1966-02-27 Today's Date: 03/06/2023   History of Present Illness Pt is a 57 year old male admitted on 02/27/23 with foot pain and left heel wound. S/p 03/05/23 Redo exposure of left common femoral artery, left common femoral endarterectomy,redo left common femoral to posterior tibial bypass, partial left second toe amputation, debridement of left heel woundPMH significiant for CAD, PAD, HTN, T2DM, CKD3a, HFrEF, hypothyroidism, and cirrhosis.   Clinical Impression   Pt reports independence at baseline with ADLs and functional mobility, lives alone and reports has PRN assist from friends/family. Pt currently needing set up -min A for ADLs, mod I for bed mobility, and min guard A for step pivot transfer from chair  > bed, pt educated on LLE precautions and how to don/doff orthowedge shoe for heel weightbearing. Pt presenting with impairments listed below, will follow acutely. Recommend HHOT at d/c pending progression.      Recommendations for follow up therapy are one component of a multi-disciplinary discharge planning process, led by the attending physician.  Recommendations may be updated based on patient status, additional functional criteria and insurance authorization.   Assistance Recommended at Discharge Set up Supervision/Assistance  Patient can return home with the following A little help with walking and/or transfers;A little help with bathing/dressing/bathroom;Direct supervision/assist for medications management;Direct supervision/assist for financial management;Assist for transportation;Help with stairs or ramp for entrance;Assistance with cooking/housework    Functional Status Assessment  Patient has had a recent decline in their functional status and demonstrates the ability to make significant improvements in function in a reasonable and predictable amount of time.  Equipment Recommendations   BSC/3in1;Tub/shower seat    Recommendations for Other Services PT consult     Precautions / Restrictions Precautions Precautions: Fall Restrictions Weight Bearing Restrictions: Yes Other Position/Activity Restrictions: LLE WB through heel only, orthowedge shoe in room      Mobility Bed Mobility Overal bed mobility: Modified Independent             General bed mobility comments: sit > supine independently    Transfers Overall transfer level: Needs assistance Equipment used: Rolling walker (2 wheels) Transfers: Sit to/from Stand, Bed to chair/wheelchair/BSC Sit to Stand: Min guard     Step pivot transfers: Min guard     General transfer comment: new Orthowedge shoe delivered, donned for transfer      Balance Overall balance assessment: Mild deficits observed, not formally tested                                         ADL either performed or assessed with clinical judgement   ADL Overall ADL's : Needs assistance/impaired Eating/Feeding: Set up;Sitting   Grooming: Set up;Sitting   Upper Body Bathing: Min guard;Sitting   Lower Body Bathing: Min guard;Sitting/lateral leans   Upper Body Dressing : Min guard;Sitting   Lower Body Dressing: Minimal assistance;Sitting/lateral leans   Toilet Transfer: Min guard;Rolling walker (2 wheels)   Toileting- Clothing Manipulation and Hygiene: Min guard       Functional mobility during ADLs: Min guard;Rolling walker (2 wheels)       Vision   Vision Assessment?: No apparent visual deficits     Perception Perception Perception Tested?: No   Praxis Praxis Praxis tested?: Not tested    Pertinent Vitals/Pain Pain Assessment Pain Assessment: Faces Pain Score: 7  Faces Pain Scale:  Hurts whole lot Pain Location: B Feet. L foot worse than R Pain Descriptors / Indicators: Discomfort, Grimacing, Constant, Sore Pain Intervention(s): Limited activity within patient's tolerance, Monitored during  session, Repositioned     Hand Dominance Right   Extremity/Trunk Assessment Upper Extremity Assessment Upper Extremity Assessment: Overall WFL for tasks assessed   Lower Extremity Assessment Lower Extremity Assessment: Defer to PT evaluation   Cervical / Trunk Assessment Cervical / Trunk Assessment: Normal   Communication Communication Communication: No difficulties   Cognition Arousal/Alertness: Awake/alert Behavior During Therapy: WFL for tasks assessed/performed Overall Cognitive Status: Within Functional Limits for tasks assessed                                       General Comments  VSS on RA    Exercises     Shoulder Instructions      Home Living Family/patient expects to be discharged to:: Private residence Living Arrangements: Alone Available Help at Discharge: Neighbor;Family;Available PRN/intermittently Type of Home: House Home Access: Stairs to enter Entrance Stairs-Number of Steps: 1 Entrance Stairs-Rails: None Home Layout: One level     Bathroom Shower/Tub: Chief Strategy Officer: Standard     Home Equipment: Agricultural consultant (2 wheels);Cane - single point;Hand held shower head;Wheelchair - manual;Wheelchair - power          Prior Functioning/Environment Prior Level of Function : Independent/Modified Independent;Driving             Mobility Comments: Mod I RW; uses cane for community mobility ADLs Comments: Ind        OT Problem List: Decreased strength;Decreased range of motion;Decreased activity tolerance;Impaired balance (sitting and/or standing)      OT Treatment/Interventions: Self-care/ADL training;Therapeutic exercise;Energy conservation;DME and/or AE instruction;Therapeutic activities;Patient/family education;Balance training    OT Goals(Current goals can be found in the care plan section) Acute Rehab OT Goals Patient Stated Goal: none stated OT Goal Formulation: With patient Time For Goal  Achievement: 03/20/23 Potential to Achieve Goals: Good ADL Goals Pt Will Perform Upper Body Dressing: Independently;sitting Pt Will Perform Lower Body Dressing: Independently;sit to/from stand;sitting/lateral leans Pt Will Transfer to Toilet: Independently;ambulating;regular height toilet Pt Will Perform Tub/Shower Transfer: Tub transfer;Shower transfer;Independently;ambulating;shower seat;rolling walker  OT Frequency: Min 1X/week    Co-evaluation              AM-PAC OT "6 Clicks" Daily Activity     Outcome Measure Help from another person eating meals?: None Help from another person taking care of personal grooming?: A Little Help from another person toileting, which includes using toliet, bedpan, or urinal?: A Little Help from another person bathing (including washing, rinsing, drying)?: A Little Help from another person to put on and taking off regular upper body clothing?: A Little Help from another person to put on and taking off regular lower body clothing?: A Little 6 Click Score: 19   End of Session Equipment Utilized During Treatment: Rolling walker (2 wheels) Nurse Communication: Mobility status  Activity Tolerance: Patient tolerated treatment well Patient left: in bed;with call bell/phone within reach;with bed alarm set;with nursing/sitter in room  OT Visit Diagnosis: Unsteadiness on feet (R26.81);Other abnormalities of gait and mobility (R26.89);Muscle weakness (generalized) (M62.81)                Time: 1610-9604 OT Time Calculation (min): 19 min Charges:  OT General Charges $OT Visit: 1 Visit OT Evaluation $OT Eval Low Complexity:  1 Low  Carver Fila, OTD, OTR/L SecureChat Preferred Acute Rehab [phone]   Dalphine Handing 03/06/2023, 4:07 PM

## 2023-03-06 NOTE — Progress Notes (Signed)
PT Cancellation Note  Patient Details Name: Edward Rocha. MRN: [id number] DOB: 04-21-1966   Cancelled Treatment:    Reason Eval/Treat Not Completed: (P) Other (comment) Pt has not yet received orthopedic shoe for heel weightbearing only. Have reached out to ortho tech and shoe has been ordered from outside vendor. PT will follow back this afternoon and Re-Evaluate patient once orthopedic shoe is present.   Rally Ouch B. Beverely Risen PT, DPT Acute Rehabilitation Services Please use secure chat or  Call Office [phone]    Elon Alas [facility] 03/06/2023, 8:57 AM

## 2023-03-07 DIAGNOSIS — M86172 Other acute osteomyelitis, left ankle and foot: Secondary | ICD-10-CM | POA: Diagnosis not present

## 2023-03-07 DIAGNOSIS — E1159 Type 2 diabetes mellitus with other circulatory complications: Secondary | ICD-10-CM | POA: Diagnosis not present

## 2023-03-07 DIAGNOSIS — I70229 Atherosclerosis of native arteries of extremities with rest pain, unspecified extremity: Secondary | ICD-10-CM | POA: Diagnosis not present

## 2023-03-07 DIAGNOSIS — E1152 Type 2 diabetes mellitus with diabetic peripheral angiopathy with gangrene: Secondary | ICD-10-CM | POA: Diagnosis not present

## 2023-03-07 LAB — BASIC METABOLIC PANEL
Anion gap: 8 (ref 5–15)
BUN: 37 mg/dL — ABNORMAL HIGH (ref 6–20)
CO2: 18 mmol/L — ABNORMAL LOW (ref 22–32)
Calcium: 8.1 mg/dL — ABNORMAL LOW (ref 8.9–10.3)
Chloride: 102 mmol/L (ref 98–111)
Creatinine, Ser: 1.5 mg/dL — ABNORMAL HIGH (ref 0.61–1.24)
GFR, Estimated: 54 mL/min — ABNORMAL LOW (ref >60)
Glucose, Bld: 165 mg/dL — ABNORMAL HIGH (ref 70–99)
Potassium: 5.1 mmol/L (ref 3.5–5.1)
Sodium: 128 mmol/L — ABNORMAL LOW (ref 135–145)

## 2023-03-07 LAB — CBC
HCT: 26 % — ABNORMAL LOW (ref 39.0–52.0)
Hemoglobin: 8.6 g/dL — ABNORMAL LOW (ref 13.0–17.0)
MCH: 27.9 pg (ref 26.0–34.0)
MCHC: 33.1 g/dL (ref 30.0–36.0)
MCV: 84.4 fL (ref 80.0–100.0)
Platelets: 145 10*3/uL — ABNORMAL LOW (ref 150–400)
RBC: 3.08 MIL/uL — ABNORMAL LOW (ref 4.22–5.81)
RDW: 15 % (ref 11.5–15.5)
WBC: 14.5 10*3/uL — ABNORMAL HIGH (ref 4.0–10.5)
nRBC: 0 % (ref 0.0–0.2)

## 2023-03-07 MED ORDER — ACETAMINOPHEN 500 MG PO TABS
1000.0000 mg | ORAL_TABLET | Freq: Four times a day (QID) | ORAL | Status: DC
  Administered 2023-03-07 – 2023-03-12 (×15): 1000 mg via ORAL
  Filled 2023-03-07 (×16): qty 2

## 2023-03-07 MED ORDER — FUROSEMIDE 40 MG PO TABS
40.0000 mg | ORAL_TABLET | Freq: Every day | ORAL | Status: DC
  Administered 2023-03-07 – 2023-03-12 (×6): 40 mg via ORAL
  Filled 2023-03-07 (×6): qty 1

## 2023-03-07 NOTE — Progress Notes (Addendum)
  Progress Note    03/07/2023 8:42 AM 2 Days Post-Op  Subjective:  no complaints   Vitals:   03/07/23 0709 03/07/23 0710  BP:  114/75  Pulse:  (!) 103  Resp: 19   Temp: 98 F (36.7 C)   SpO2: 99%    Physical Exam: Lungs:  non labored Incisions:  L groin c/d/I; L popliteal c/d/I; no purulence from achillis or toe wounds Extremities:  L PT signal by doppler; LLE edematous Neurologic: A&O  CBC    Component Value Date/Time   WBC 14.5 (H) 03/07/2023 0123   RBC 3.08 (L) 03/07/2023 0123   HGB 8.6 (L) 03/07/2023 0123   HCT 26.0 (L) 03/07/2023 0123   PLT 145 (L) 03/07/2023 0123   MCV 84.4 03/07/2023 0123   MCH 27.9 03/07/2023 0123   MCHC 33.1 03/07/2023 0123   RDW 15.0 03/07/2023 0123   LYMPHSABS 1.1 02/27/2023 0858   MONOABS 0.6 02/27/2023 0858   EOSABS 0.1 02/27/2023 0858   BASOSABS 0.0 02/27/2023 0858    BMET    Component Value Date/Time   NA 128 (L) 03/07/2023 0123   K 5.1 03/07/2023 0123   CL 102 03/07/2023 0123   CO2 18 (L) 03/07/2023 0123   GLUCOSE 165 (H) 03/07/2023 0123   BUN 37 (H) 03/07/2023 0123   CREATININE 1.50 (H) 03/07/2023 0123   CALCIUM 8.1 (L) 03/07/2023 0123   GFRNONAA 54 (L) 03/07/2023 0123   GFRAA 54 (L) 10/19/2019 0616    INR    Component Value Date/Time   INR 1.2 12/12/2021 0015     Intake/Output Summary (Last 24 hours) at 03/07/2023 0842 Last data filed at 03/07/2023 0700 Gross per 24 hour  Intake 1050 ml  Output 1200 ml  Net -150 ml     Assessment/Plan:  57 y.o. male is s/p  L femoral to PT redo bypass, L heel debridement, 2nd toe amp  2 Days Post-Op   L PT signal by doppler similar to yesterday Dressing changed L foot; wet to dry packing on achillis, no purulence noted OOB with therapy; heel weightbearing only Continue to hold Xarelto Agree with IV lasix today Continue cefepime, vanc, flagyl Plans noted for achillis debridement with Dr. Lajoyce Corners on Friday   Emilie Rutter, PA-C [facility] [phone] 03/07/2023 8:42 AM   I have seen and evaluated the patient. I agree with the PA note as documented above.  Postop day 2 status post left femoral endarterectomy with profundoplasty and bovine pericardial patch angioplasty and a redo fem PT bypass with PTFE.  Also required partial left second toe amputation and heel debridement.  Has pretty brisk PT signal at the ankle in the setting of significant edema.  Toe amputation looks okay.  Noted plans for debridement of the heel with orthopedic surgery by Dr. Lajoyce Corners and appreciate his assistance.  OR cultures Gram positive cocci on Gram stain and cultures gram-negative rods.  I think he remains very high risk for limb loss given the extent of tissue loss in the foot and very poor tibial target.  Edward Shelling, MD [facility] [place] Office: [phone]

## 2023-03-07 NOTE — Progress Notes (Signed)
Subjective:  No significant overnight events. Patient in good spirits this morning. He reports his pain is "okay," reaching about a 6 out of 10 before pain medications. He is tolerating PO intake well. He was able to sit in bedside chair yesterday and is agreeable to walking with PT today. He denies chest pain and dyspnea.  Objective:  Vital signs in last 24 hours: Vitals:   03/06/23 2320 03/07/23 0310 03/07/23 0709 03/07/23 0710  BP: 98/69 111/71  114/75  Pulse: 98 90  (!) 103  Resp: 17 19 19    Temp: 97.9 F (36.6 C) 97.9 F (36.6 C) 98 F (36.7 C)   TempSrc: Oral Oral Oral   SpO2: 96% 98% 99%   Weight:      Height:        Intake/Output Summary (Last 24 hours) at 03/07/2023 1025 Last data filed at 03/07/2023 0700 Gross per 24 hour  Intake 1050 ml  Output 1200 ml  Net -150 ml   Physical Exam: Constitutional: Middle-aged male sitting up in bed. Appears comfortable and in no acute distress. Cardiovascular: Regular rate, regular rhythm. 2/6 systolic murmur best appreciated at the left upper sternal border. LLE with +1 pitting edema. No RLE edema. Pulmonary: Normal respiratory effort. No wheezes, rales, rhonchi, or crackles.   Abdominal: Soft. Non-distended. Normal bowel sounds.    Neurological: Alert and oriented to person, place, and time. Diminished sensation to touch in toes of left foot. Skin: Warm and dry. Dressing on L foot remains in place. BLE chronic dry skin changes.  Assessment/Plan:  Principal Problem:   Critical lower limb ischemia (HCC) Active Problems:   Type 2 diabetes mellitus with circulatory disorder (HCC)   PAD (peripheral artery disease) (HCC)   Stage 3a chronic kidney disease (CKD) (HCC)   Open wound of left foot   Gangrene of left foot (HCC)   Osteomyelitis of foot, left, acute (HCC)   [name]. Is a 57 y.o. male living with PAD, HTN, T2DM, CKD stage 3a, CHF, hypothyroidism, and cirrhosis who presented to the ED with foot pain and left heel  wound and admitted for peripheral artery disease and management of left heel wound.  # PAD # Left heel wound S/p angioplasty with stent placement of left common iliac artery on 5/1. S/p L common femoral endarterectomy, redo femoral-tibial bypass, partial L second toe amputation, and debridement of L heel wound on 5/6. Gram stain of toe bone biopsy with gram positive cocci in pairs. Culture of toe bone biopsy with rare gram negative rods but re-incubated for better growth. Will continue antibiotics (cefepime, vancomycin, Flagyl) per pharmacy. Orthopedics planning for surgical debridement of L heel wound with tissue, tendon, and bone samples for culture on 5/10. Will continue holding Xarelto. Will continue medical therapy with atorvastatin and aspirin.  - Appreciate vascular recommendations - Appreciate orthopedics recommendations - Antibiotics (cefepime, vancomycin, Flagyl) per pharmacy - Hold Xarelto - Atorvastatin 80 mg - Aspirin 81 mg  # Non-ischemic cardiomyopathy # Pulmonary hypertension # HFrEF, EF 20-25% # CAD Patient has LLE with +1 pitting edema and no RLE edema on exam today. Will restart Lasix 40 mg given Na 128 and Cr 1.50 may indicate potential cardiorenal syndrome. Will continue holding Entresto and spironolactone. - Restart Lasix 40 mg  - Hold Entresto 24-26 mg BID - Hold spironolactone 12.5 mg daily  - Farxiga 10 mg  - Metoprolol succinate 50 mg daily - Atorvastatin 80 mg - Aspirin 81 mg   # Type 2  Diabetes  Glucose 165 today. Neuropathic pain controlled with post-op pain medications. Patient received oxycodone 5 mg x 2 and percocet 5-325 mg x 2 in past 24 hours. Will continue gabapentin 500 mg TID as other pain medications will eventually be weaned. - Farxiga 10 mg daily - Gabapentin 500 mg TID - Oxycodone 5 mg Q6 PRN - Percocet 5-325 mg Q4 PRN  # AKI on CKD stage 3a  Creatinine remains elevated from baseline at 1.50 today. Will restart Lasix 40 mg given Na 128 and Cr  1.50 may indicate potential cardiorenal syndrome. Will continue holding Entresto and spironolactone. Will continue monitoring renal function. - Trend BMP   # Hypertension  BP remains stable with MAPs in 70-90s overnight. - Metoprolol succinate 50 mg daily   Diet: HH Bowel: Senna VTE: Heparin IVF: None Code: Full PT/OT recs: HH PT/OT LOS: day 8   Prior to Admission Living Arrangement: home with self Anticipated Discharge Location: TBD Barriers to Discharge: continued management  Whitman Hero, Medical Student 03/07/2023, 10:25 AM

## 2023-03-07 NOTE — Progress Notes (Signed)
Mobility Specialist Progress Note:    03/07/23 1703  Mobility  Activity Ambulated with assistance in hallway  Level of Assistance Contact guard assist, steadying assist  Assistive Device Front wheel walker  Distance Ambulated (ft) 54 ft  Activity Response Tolerated well  Mobility Referral Yes  $Mobility charge 1 Mobility  Mobility Specialist Start Time (ACUTE ONLY) 1640  Mobility Specialist Stop Time (ACUTE ONLY) 1700  Mobility Specialist Time Calculation (min) (ACUTE ONLY) 20 min   Pt received in bed, eager to ambulate. Pt assisted with donning darco shoe. No c/o. Pt left at EOB with call bell in reach.   Thompson Grayer Mobility Specialist  Please contact vis Secure Chat or  Rehab Office [phone]

## 2023-03-08 DIAGNOSIS — E1159 Type 2 diabetes mellitus with other circulatory complications: Secondary | ICD-10-CM | POA: Diagnosis not present

## 2023-03-08 DIAGNOSIS — M86172 Other acute osteomyelitis, left ankle and foot: Secondary | ICD-10-CM | POA: Diagnosis not present

## 2023-03-08 DIAGNOSIS — I70229 Atherosclerosis of native arteries of extremities with rest pain, unspecified extremity: Secondary | ICD-10-CM | POA: Diagnosis not present

## 2023-03-08 DIAGNOSIS — E1152 Type 2 diabetes mellitus with diabetic peripheral angiopathy with gangrene: Secondary | ICD-10-CM | POA: Diagnosis not present

## 2023-03-08 LAB — CBC
HCT: 26.5 % — ABNORMAL LOW (ref 39.0–52.0)
Hemoglobin: 8.8 g/dL — ABNORMAL LOW (ref 13.0–17.0)
MCH: 28.2 pg (ref 26.0–34.0)
MCHC: 33.2 g/dL (ref 30.0–36.0)
MCV: 84.9 fL (ref 80.0–100.0)
Platelets: 160 10*3/uL (ref 150–400)
RBC: 3.12 MIL/uL — ABNORMAL LOW (ref 4.22–5.81)
RDW: 14.9 % (ref 11.5–15.5)
WBC: 11.2 10*3/uL — ABNORMAL HIGH (ref 4.0–10.5)
nRBC: 0 % (ref 0.0–0.2)

## 2023-03-08 LAB — AEROBIC/ANAEROBIC CULTURE W GRAM STAIN (SURGICAL/DEEP WOUND)

## 2023-03-08 LAB — BASIC METABOLIC PANEL
Anion gap: 6 (ref 5–15)
BUN: 39 mg/dL — ABNORMAL HIGH (ref 6–20)
CO2: 18 mmol/L — ABNORMAL LOW (ref 22–32)
Calcium: 8.4 mg/dL — ABNORMAL LOW (ref 8.9–10.3)
Chloride: 103 mmol/L (ref 98–111)
Creatinine, Ser: 1.48 mg/dL — ABNORMAL HIGH (ref 0.61–1.24)
GFR, Estimated: 55 mL/min — ABNORMAL LOW (ref >60)
Glucose, Bld: 150 mg/dL — ABNORMAL HIGH (ref 70–99)
Potassium: 4.6 mmol/L (ref 3.5–5.1)
Sodium: 127 mmol/L — ABNORMAL LOW (ref 135–145)

## 2023-03-08 MED ORDER — CEFAZOLIN SODIUM-DEXTROSE 2-4 GM/100ML-% IV SOLN
2.0000 g | INTRAVENOUS | Status: AC
  Administered 2023-03-09: 2 g via INTRAVENOUS
  Filled 2023-03-08: qty 100

## 2023-03-08 MED ORDER — SENNOSIDES-DOCUSATE SODIUM 8.6-50 MG PO TABS
2.0000 | ORAL_TABLET | Freq: Every day | ORAL | Status: DC
  Administered 2023-03-08 – 2023-03-10 (×3): 2 via ORAL
  Filled 2023-03-08 (×3): qty 2

## 2023-03-08 MED ORDER — POVIDONE-IODINE 10 % EX SWAB: 2.0000 | Freq: Once | CUTANEOUS | Status: DC

## 2023-03-08 MED ORDER — POLYETHYLENE GLYCOL 3350 17 G PO PACK
17.0000 g | PACK | Freq: Every day | ORAL | Status: DC
  Administered 2023-03-08 – 2023-03-10 (×2): 17 g via ORAL
  Filled 2023-03-08 (×3): qty 1

## 2023-03-08 MED ORDER — CHLORHEXIDINE GLUCONATE 4 % EX SOLN
60.0000 mL | Freq: Once | CUTANEOUS | Status: AC
  Administered 2023-03-09: 4 via TOPICAL
  Filled 2023-03-08: qty 60

## 2023-03-08 MED ORDER — ENSURE PRE-SURGERY PO LIQD
296.0000 mL | Freq: Once | ORAL | Status: AC
  Administered 2023-03-09: 296 mL via ORAL
  Filled 2023-03-08: qty 296

## 2023-03-08 NOTE — Progress Notes (Signed)
Occupational Therapy Treatment Patient Details Name: Edward Rocha. MRN: [id number] DOB: Jun 11, 1966 Today's Date: 03/08/2023   History of present illness Pt is a 57 year old male admitted on 02/27/23 with foot pain and left heel wound. S/p 03/05/23 Redo exposure of left common femoral artery, left common femoral endarterectomy,redo left common femoral to posterior tibial bypass, partial left second toe amputation, debridement of left heel woundPMH significiant for CAD, PAD, HTN, T2DM, CKD3a, HFrEF, hypothyroidism, and cirrhosis.   OT comments  Pt continuing to progress during acute stay, Pt demonstrating good carryover of body mechanics to place weight on heels with functional mobility, orthowedge shoe on patient throughout entirety of session. Pt tolerating pain well with functional ambulation. Pt having difficulty maintaining normal stride with RW despite education and demonstration. Pt was able to practice stride with more success after getting a RW with less friction, but still not a normal gait pattern. OT to continue to progress pt as able, DC plans remain appropriate for [facility] services at this time. OT to reassess pt functional capacities s/p debridement if necessary.   Recommendations for follow up therapy are one component of a multi-disciplinary discharge planning process, led by the attending physician.  Recommendations may be updated based on patient status, additional functional criteria and insurance authorization.    Assistance Recommended at Discharge Set up Supervision/Assistance  Patient can return home with the following  A little help with walking and/or transfers;A little help with bathing/dressing/bathroom;Direct supervision/assist for medications management;Direct supervision/assist for financial management;Assist for transportation;Help with stairs or ramp for entrance;Assistance with cooking/housework   Equipment Recommendations  BSC/3in1;Tub/shower seat    Recommendations for  Other Services      Precautions / Restrictions Precautions Precautions: Fall Restrictions Weight Bearing Restrictions: Yes Other Position/Activity Restrictions: LLE WB through heel only, orthowedge shoe in room       Mobility Bed Mobility               General bed mobility comments: pt rec'd and left sitting in recliner    Transfers Overall transfer level: Needs assistance Equipment used: Rolling walker (2 wheels) Transfers: Sit to/from Stand Sit to Stand: Min guard           General transfer comment: orthowedge shoe donned thrhoughout session     Balance Overall balance assessment: Mild deficits observed, not formally tested                                         ADL either performed or assessed with clinical judgement   ADL                                       Functional mobility during ADLs: Min guard;Rolling walker (2 wheels)      Extremity/Trunk Assessment              Vision       Perception     Praxis      Cognition Arousal/Alertness: Awake/alert Behavior During Therapy: WFL for tasks assessed/performed Overall Cognitive Status: Within Functional Limits for tasks assessed                                          Exercises  Shoulder Instructions       General Comments VSS on  RA    Pertinent Vitals/ Pain       Pain Assessment Pain Assessment: Faces Faces Pain Scale: Hurts even more Pain Location: LLE Pain Descriptors / Indicators: Discomfort, Grimacing, Constant, Sore Pain Intervention(s): Limited activity within patient's tolerance, Monitored during session, Repositioned  Home Living                                          Prior Functioning/Environment              Frequency  Min 1X/week        Progress Toward Goals  OT Goals(current goals can now be found in the care plan section)  Progress towards OT goals: Progressing toward  goals  Acute Rehab OT Goals Patient Stated Goal: none stated OT Goal Formulation: With patient Time For Goal Achievement: 03/20/23 Potential to Achieve Goals: Good  Plan Discharge plan remains appropriate;Frequency remains appropriate    Co-evaluation                 AM-PAC OT "6 Clicks" Daily Activity     Outcome Measure   Help from another person eating meals?: None Help from another person taking care of personal grooming?: A Little Help from another person toileting, which includes using toliet, bedpan, or urinal?: A Little Help from another person bathing (including washing, rinsing, drying)?: A Little Help from another person to put on and taking off regular upper body clothing?: A Little Help from another person to put on and taking off regular lower body clothing?: A Little 6 Click Score: 19    End of Session Equipment Utilized During Treatment: Rolling walker (2 wheels)  OT Visit Diagnosis: Unsteadiness on feet (R26.81);Other abnormalities of gait and mobility (R26.89);Muscle weakness (generalized) (M62.81)   Activity Tolerance Patient tolerated treatment well   Patient Left in chair;with call bell/phone within reach   Nurse Communication Mobility status        Time: 0981-1914 OT Time Calculation (min): 29 min  Charges: OT General Charges $OT Visit: 1 Visit OT Treatments $Therapeutic Activity: 23-37 mins  03/08/2023  AB, OTR/L  Acute Rehabilitation Services  Office: [phone]   Tristan Schroeder 03/08/2023, 5:50 PM

## 2023-03-08 NOTE — Progress Notes (Signed)
Physical Therapy Treatment Patient Details Name: Edward Rocha. MRN: [id number] DOB: 19-Feb-1966 Today's Date: 03/08/2023   History of Present Illness Pt is a 57 year old male admitted on 02/27/23 with foot pain and left heel wound. S/p 03/05/23 Redo exposure of left common femoral artery, left common femoral endarterectomy,redo left common femoral to posterior tibial bypass, partial left second toe amputation, debridement of left heel wound. Plans noted for achilles debridement with Dr. Lajoyce Corners on Friday 5/10. PMH significant for CAD, PAD, HTN, T2DM, CKD3a, HFrEF, hypothyroidism, and cirrhosis.    PT Comments    Pt received in recliner, agreeable to therapy session and with good participation and tolerance for transfer, gait and stair training with 7" platform in room. Pt needing up to min guard and cues for safety/step sequencing, pt with good carryover of step-to gait pattern needed due to now WB through L heel only with forefoot offloading Darco shoe. Pt continues to benefit from PT services to progress toward functional mobility goals, plan noted for L achilles debridement Friday, pt would benefit from 1-2 additional sessions to ensure safety post-op, continue to recommend HHPT upon DC.   Recommendations for follow up therapy are one component of a multi-disciplinary discharge planning process, led by the attending physician.  Recommendations may be updated based on patient status, additional functional criteria and insurance authorization.  Follow Up Recommendations       Assistance Recommended at Discharge PRN  Patient can return home with the following A little help with walking and/or transfers;A little help with bathing/dressing/bathroom;Assistance with cooking/housework;Direct supervision/assist for medications management;Assist for transportation;Help with stairs or ramp for entrance   Equipment Recommendations  None recommended by PT    Recommendations for Other Services        Precautions / Restrictions Precautions Precautions: Fall Restrictions Weight Bearing Restrictions: Yes Other Position/Activity Restrictions: LLE WB through heel only, orthowedge shoe in room     Mobility  Bed Mobility               General bed mobility comments: pt rec'd and left sitting in recliner    Transfers Overall transfer level: Needs assistance Equipment used: Rolling walker (2 wheels) Transfers: Sit to/from Stand Sit to Stand: Min guard           General transfer comment: orthowedge shoe donned throughout session, cues for LLE placement for improved WB compliance and less pain    Ambulation/Gait Ambulation/Gait assistance: Supervision Gait Distance (Feet): 200 Feet Assistive device: Rolling walker (2 wheels) Gait Pattern/deviations: Decreased step length - left, Decreased stance time - left, Decreased stride length, Step-to pattern       General Gait Details: Initial demo/cues for step-to pattern and sequencing with RW given pt is heel WB through forefoot offloading darco shoe, pt demos good recall from previous OT session and good activity pacing/RW use.   Stairs Stairs: Yes Stairs assistance: Min guard Stair Management: Two rails, Step to pattern, Forwards Number of Stairs: 1 General stair comments: cues for sequencing pattern for reduced pain/proper WB and pt with carryover of instruction as given.   Wheelchair Mobility    Modified Rankin (Stroke Patients Only)       Balance Overall balance assessment: Mild deficits observed, not formally tested                                          Cognition Arousal/Alertness: Awake/alert  Behavior During Therapy: WFL for tasks assessed/performed Overall Cognitive Status: Within Functional Limits for tasks assessed                                 General Comments: pleasantly cooperative, joking, at times tangential; good carryover of safety instruction from previous  sessions.        Exercises Other Exercises Other Exercises: seated BLE AROM: hip flexion, LAQ, ankle pumps x10-20 reps ea    General Comments General comments (skin integrity, edema, etc.): VSS on RA, no acute s/sx distress; yellow bone foam positioned under his LLE to elevate limb at end of session      Pertinent Vitals/Pain Pain Assessment Pain Assessment: Faces Faces Pain Scale: Hurts little more Pain Location: LLE Pain Descriptors / Indicators: Discomfort, Sore, Guarding Pain Intervention(s): Monitored during session, Repositioned, Premedicated before session    Home Living                          Prior Function            PT Goals (current goals can now be found in the care plan section) Acute Rehab PT Goals Patient Stated Goal: to go home PT Goal Formulation: With patient Time For Goal Achievement: 03/15/23 Progress towards PT goals: Progressing toward goals    Frequency    Min 1X/week      PT Plan Current plan remains appropriate    Co-evaluation              AM-PAC PT "6 Clicks" Mobility   Outcome Measure  Help needed turning from your back to your side while in a flat bed without using bedrails?: None Help needed moving from lying on your back to sitting on the side of a flat bed without using bedrails?: None Help needed moving to and from a bed to a chair (including a wheelchair)?: None Help needed standing up from a chair using your arms (e.g., wheelchair or bedside chair)?: A Little Help needed to walk in hospital room?: A Little Help needed climbing 3-5 steps with a railing? : A Little 6 Click Score: 21    End of Session Equipment Utilized During Treatment: Gait belt (pt given gait belt to take home) Activity Tolerance: Patient tolerated treatment well Patient left: in chair;with call bell/phone within reach;Other (comment) (set up with tray to eat dinner) Nurse Communication: Mobility status PT Visit Diagnosis: Other  abnormalities of gait and mobility (R26.89);Pain Pain - Right/Left: Left Pain - part of body: Ankle and joints of foot     Time: 1610-9604 PT Time Calculation (min) (ACUTE ONLY): 31 min  Charges:  $Gait Training: 8-22 mins $Therapeutic Exercise: 8-22 mins                     Mace Weinberg P., PTA Acute Rehabilitation Services Secure Chat Preferred 9a-5:30pm Office: [phone]    Dorathy Kinsman [facility] 03/08/2023, 6:47 PM

## 2023-03-08 NOTE — Anesthesia Preprocedure Evaluation (Addendum)
Anesthesia Evaluation  Patient identified by MRN, date of birth, ID band Patient awake    Reviewed: Allergy & Precautions, NPO status , Patient's Chart, lab work & pertinent test results  Airway Mallampati: III  TM Distance: >3 FB Neck ROM: Full    Dental no notable dental hx. (+) Edentulous Upper, Edentulous Lower   Pulmonary COPD, former smoker   Pulmonary exam normal breath sounds clear to auscultation       Cardiovascular hypertension, Pt. on home beta blockers + CAD, + Peripheral Vascular Disease and +CHF  Normal cardiovascular exam Rhythm:Regular Rate:Normal     Neuro/Psych negative neurological ROS  negative psych ROS   GI/Hepatic ,GERD  ,,(+) Cirrhosis         Endo/Other  diabetes, Type 2, Oral Hypoglycemic Agents, Insulin DependentHypothyroidism  Lab Results      Component                Value               Date                      WBC                      11.2 (H)            03/08/2023                HGB                      8.8 (L)             03/08/2023                HCT                      26.5 (L)            03/08/2023                MCV                      84.9                03/08/2023                PLT                      160                 03/08/2023             Renal/GU Renal InsufficiencyRenal diseaseLab Results      Component                Value               Date                      CREATININE               1.48 (H)            03/08/2023                BUN                      39 (H)  03/08/2023                NA                       127 (L)             03/08/2023                K                        4.6                 03/08/2023                CL                       103                 03/08/2023                CO2                      18 (L)              03/08/2023                Musculoskeletal negative musculoskeletal ROS (+)    Abdominal   Peds   Hematology  (+) Blood dyscrasia, anemia HLD Thrombocytopenia    Anesthesia Other Findings Gangrene L foot  Reproductive/Obstetrics                             Anesthesia Physical Anesthesia Plan  ASA: 3  Anesthesia Plan: General   Post-op Pain Management: Precedex   Induction:   PONV Risk Score and Plan: 2 and Ondansetron, Treatment may vary due to age or medical condition, Midazolam and Propofol infusion  Airway Management Planned: LMA  Additional Equipment: None  Intra-op Plan:   Post-operative Plan:   Informed Consent: I have reviewed the patients History and Physical, chart, labs and discussed the procedure including the risks, benefits and alternatives for the proposed anesthesia with the patient or authorized representative who has indicated his/her understanding and acceptance.     Dental advisory given  Plan Discussed with: CRNA  Anesthesia Plan Comments: (GA)        Anesthesia Quick Evaluation

## 2023-03-08 NOTE — Progress Notes (Signed)
Subjective:  No significant overnight events. Patient reports pain is well-controlled. He walked 84ft with the mobility specialist yesterday, which he tolerated well other than mild SOB. He reports the boot makes his gait somewhat awkward. He denies chest pain and SOB this morning. He reports only having 1 small BM since surgery.  Objective:  Vital signs in last 24 hours: Vitals:   03/07/23 2000 03/07/23 2327 03/08/23 0342 03/08/23 0730  BP:  108/87 (!) 145/76 (!) 155/84  Pulse: 86 87 94 88  Resp:  18 18 17   Temp:  97.9 F (36.6 C) 97.7 F (36.5 C) 97.8 F (36.6 C)  TempSrc:  Oral Oral Oral  SpO2: 100% 100% 96% 100%  Weight:      Height:       Intake/Output Summary (Last 24 hours) at 03/08/2023 0934 Last data filed at 03/08/2023 0732 Gross per 24 hour  Intake 240 ml  Output 2825 ml  Net -2585 ml   Physical Exam: Constitutional: Middle-aged male lying in bed watching TV. Appears comfortable and in no acute distress. Cardiovascular: Regular rate, regular rhythm. 2/6 systolic murmur best appreciated at the left upper sternal border. LLE with +2 pitting edema. RLE edema with +1 pitting edema. Pulmonary: Normal respiratory effort. No wheezes, rales, rhonchi, or crackles.   Abdominal: Soft. Non-distended. Normal bowel sounds.    Neurological: Alert and oriented to person, place, and time. Skin: Warm and dry. Popliteal incision with no erythema or drainage. Bandage on left heel in place. BLE chronic dry skin changes.   Assessment/Plan:  Principal Problem:   Critical lower limb ischemia (HCC) Active Problems:   Type 2 diabetes mellitus with circulatory disorder (HCC)   PAD (peripheral artery disease) (HCC)   Stage 3a chronic kidney disease (CKD) (HCC)   Open wound of left foot   Gangrene of left foot (HCC)   Osteomyelitis of foot, left, acute (HCC)   Edward Rocha. Is a 57 y.o. male living with PAD, HTN, T2DM, CKD stage 3a, CHF, hypothyroidism, and cirrhosis who presented to  the ED with foot pain and left heel wound and admitted for critical ischemia of left leg and management of left heel wound.   # PAD # Left heel wound S/p angioplasty with stent placement of left common iliac artery on 5/1. S/p L common femoral endarterectomy, redo femoral-tibial bypass, partial L second toe amputation, and debridement of L heel wound on 5/6. Culture of toe bone biopsy with rare gram negative rods and no anaerobes. Will continue antibiotics (cefepime, vancomycin, Flagyl) per pharmacy. Orthopedics planning for surgical debridement of L heel wound with tissue, tendon, and bone samples for culture on 5/10. Will continue holding Xarelto. Will continue medical therapy with atorvastatin and aspirin.  - Appreciate vascular recommendations - Appreciate orthopedics recommendations - Antibiotics (cefepime, vancomycin, Flagyl) per pharmacy - Hold Xarelto - Atorvastatin 80 mg - Aspirin 81 mg  # Non-ischemic cardiomyopathy # Pulmonary hypertension # HFrEF, EF 20-25% # CAD Patient has LLE with +2 pitting edema and RLE with +1 pitting edema on exam today. Will continue lasix given stable creatinine 1.48 and Na 127 after dose yesterday. Encouraged patient to increase PO intake of fluids. Will continue holding Entresto and spironolactone.  - Continue Lasix 40 mg  - Hold Entresto 24-26 mg BID - Hold spironolactone 12.5 mg daily  - Farxiga 10 mg  - Metoprolol succinate 50 mg daily - Atorvastatin 80 mg - Aspirin 81 mg   # Type 2 Diabetes  Glucose 150 today.  Patient reports no longer experiencing neuropathic pain. Patient received oxycodone 5 mg x 2 in past 24 hours. Will continue gabapentin 500 mg TID. - Farxiga 10 mg daily - Gabapentin 500 mg TID - Oxycodone 5 mg Q6 PRN  # AKI on CKD stage 3a  Creatinine remains elevated from baseline at 1.48 today. Will continue Lasix given creatinine stable after dose yesterday. Will continue holding Entresto and spironolactone. Will continue  monitoring renal function.  - Trend BMP   # Hypertension  BP remains stable with MAPs in 80-90s overnight.  - Metoprolol succinate 50 mg daily  Diet: HH Bowel: Senna BID VTE: Heparin IVF: None Code: Full PT/OT recs: HH PT/OT LOS: day 9   Prior to Admission Living Arrangement: home with self Anticipated Discharge Location: TBD Barriers to Discharge: continued management  Whitman Hero, Medical Student 03/08/2023, 9:34 AM

## 2023-03-08 NOTE — Progress Notes (Addendum)
  Progress Note    03/08/2023 7:50 AM 3 Days Post-Op  Subjective:  no complaints   Vitals:   03/08/23 0342 03/08/23 0730  BP: (!) 145/76 (!) 155/84  Pulse: 94 88  Resp: 18 17  Temp: 97.7 F (36.5 C) 97.8 F (36.6 C)  SpO2: 96% 100%   Physical Exam: Lungs:  non labored Incisions:  L groin and popliteal incision c/d/i Extremities:  brisk L PT by doppler Neurologic: A&O  CBC    Component Value Date/Time   WBC 11.2 (H) 03/08/2023 0204   RBC 3.12 (L) 03/08/2023 0204   HGB 8.8 (L) 03/08/2023 0204   HCT 26.5 (L) 03/08/2023 0204   PLT 160 03/08/2023 0204   MCV 84.9 03/08/2023 0204   MCH 28.2 03/08/2023 0204   MCHC 33.2 03/08/2023 0204   RDW 14.9 03/08/2023 0204   LYMPHSABS 1.1 02/27/2023 0858   MONOABS 0.6 02/27/2023 0858   EOSABS 0.1 02/27/2023 0858   BASOSABS 0.0 02/27/2023 0858    BMET    Component Value Date/Time   NA 127 (L) 03/08/2023 0204   K 4.6 03/08/2023 0204   CL 103 03/08/2023 0204   CO2 18 (L) 03/08/2023 0204   GLUCOSE 150 (H) 03/08/2023 0204   BUN 39 (H) 03/08/2023 0204   CREATININE 1.48 (H) 03/08/2023 0204   CALCIUM 8.4 (L) 03/08/2023 0204   GFRNONAA 55 (L) 03/08/2023 0204   GFRAA 54 (L) 10/19/2019 0616    INR    Component Value Date/Time   INR 1.2 12/12/2021 0015     Intake/Output Summary (Last 24 hours) at 03/08/2023 0750 Last data filed at 03/08/2023 0732 Gross per 24 hour  Intake 520 ml  Output 2825 ml  Net -2305 ml     Assessment/Plan:  57 y.o. male is s/p L femoral to PT redo bypass, L heel debridement, 2nd toe amp  3 Days Post-Op   L PT brisk by doppler Continue current wound care with wet to dry packing L achillis and dry dressing changes 2nd toe amp every shift OOB with therapy; darco shoe for heel weightbearing status Continue IV antibiotics Plans noted for achillis debridement with Dr Lajoyce Corners tomorrow 5/10   Emilie Rutter, PA-C [facility] [phone] 03/08/2023 7:50 AM  I have seen and evaluated  the patient. I agree with the PA note as documented above.  Extensive revascularization of the left leg in the last week including left common iliac stent as well as extensive femoral endarterectomy with bovine patch and redo left fem-tib bypass with PTFE.  Significant lower extremity edema.  Still has a PT signal that is pretty good.  I have wrapped his foot and leg with Ace wrap's and also got him pills for him to elevate his leg.  OR tomorrow with Dr. Lajoyce Corners for debridement of the heel.  Remains high risk for limb loss.  OR cultures from toe amputation growing Enterobacter.  Cephus Shelling, MD [facility] [place] Office: [phone]

## 2023-03-08 NOTE — Plan of Care (Signed)
  Problem: Education: Goal: Knowledge of General Education information will improve Description: Including pain rating scale, medication(s)/side effects and non-pharmacologic comfort measures Outcome: Progressing   Problem: Health Behavior/Discharge Planning: Goal: Ability to manage health-related needs will improve Outcome: Progressing   Problem: Clinical Measurements: Goal: Ability to maintain clinical measurements within normal limits will improve Outcome: Progressing Goal: Will remain free from infection Outcome: Progressing Goal: Diagnostic test results will improve Outcome: Progressing Goal: Respiratory complications will improve Outcome: Progressing Goal: Cardiovascular complication will be avoided Outcome: Progressing   Problem: Activity: Goal: Risk for activity intolerance will decrease Outcome: Progressing   Problem: Nutrition: Goal: Adequate nutrition will be maintained Outcome: Progressing   Problem: Coping: Goal: Level of anxiety will decrease Outcome: Progressing   Problem: Elimination: Goal: Will not experience complications related to bowel motility Outcome: Progressing Goal: Will not experience complications related to urinary retention Outcome: Progressing   Problem: Pain Managment: Goal: General experience of comfort will improve Outcome: Progressing   Problem: Safety: Goal: Ability to remain free from injury will improve Outcome: Progressing   Problem: Skin Integrity: Goal: Risk for impaired skin integrity will decrease Outcome: Progressing   Problem: Education: Goal: Understanding of CV disease, CV risk reduction, and recovery process will improve Outcome: Progressing Goal: Individualized Educational Video(s) Outcome: Progressing   Problem: Activity: Goal: Ability to return to baseline activity level will improve Outcome: Progressing   Problem: Cardiovascular: Goal: Ability to achieve and maintain adequate cardiovascular perfusion  will improve Outcome: Progressing Goal: Vascular access site(s) Level 0-1 will be maintained Outcome: Progressing   Problem: Health Behavior/Discharge Planning: Goal: Ability to safely manage health-related needs after discharge will improve Outcome: Progressing   Problem: Education: Goal: Knowledge of prescribed regimen will improve Outcome: Progressing   Problem: Activity: Goal: Ability to tolerate increased activity will improve Outcome: Progressing   Problem: Bowel/Gastric: Goal: Gastrointestinal status for postoperative course will improve Outcome: Progressing   Problem: Clinical Measurements: Goal: Postoperative complications will be avoided or minimized Outcome: Progressing Goal: Signs and symptoms of graft occlusion will improve Outcome: Progressing   Problem: Skin Integrity: Goal: Demonstration of wound healing without infection will improve Outcome: Progressing   

## 2023-03-09 ENCOUNTER — Encounter (HOSPITAL_COMMUNITY)
Admission: EM | Disposition: A | Discharge status: Home Health | Payer: Self-pay | Source: Home / Self Care | Attending: Infectious Diseases

## 2023-03-09 ENCOUNTER — Inpatient Hospital Stay (HOSPITAL_COMMUNITY): Payer: Medicare Other | Admitting: Anesthesiology

## 2023-03-09 ENCOUNTER — Other Ambulatory Visit: Payer: Self-pay

## 2023-03-09 DIAGNOSIS — I110 Hypertensive heart disease with heart failure: Secondary | ICD-10-CM

## 2023-03-09 DIAGNOSIS — E1152 Type 2 diabetes mellitus with diabetic peripheral angiopathy with gangrene: Secondary | ICD-10-CM

## 2023-03-09 DIAGNOSIS — M86172 Other acute osteomyelitis, left ankle and foot: Secondary | ICD-10-CM | POA: Diagnosis not present

## 2023-03-09 DIAGNOSIS — E1169 Type 2 diabetes mellitus with other specified complication: Secondary | ICD-10-CM

## 2023-03-09 DIAGNOSIS — I96 Gangrene, not elsewhere classified: Secondary | ICD-10-CM | POA: Diagnosis not present

## 2023-03-09 DIAGNOSIS — Z87891 Personal history of nicotine dependence: Secondary | ICD-10-CM

## 2023-03-09 DIAGNOSIS — I70229 Atherosclerosis of native arteries of extremities with rest pain, unspecified extremity: Secondary | ICD-10-CM | POA: Diagnosis not present

## 2023-03-09 DIAGNOSIS — M869 Osteomyelitis, unspecified: Secondary | ICD-10-CM

## 2023-03-09 DIAGNOSIS — I2510 Atherosclerotic heart disease of native coronary artery without angina pectoris: Secondary | ICD-10-CM

## 2023-03-09 DIAGNOSIS — I509 Heart failure, unspecified: Secondary | ICD-10-CM

## 2023-03-09 HISTORY — PX: I & D EXTREMITY: SHX5045

## 2023-03-09 LAB — TYPE AND SCREEN
ABO/RH(D): O POS
Antibody Screen: NEGATIVE

## 2023-03-09 LAB — CBC
HCT: 27.2 % — ABNORMAL LOW (ref 39.0–52.0)
Hemoglobin: 9 g/dL — ABNORMAL LOW (ref 13.0–17.0)
MCH: 28.5 pg (ref 26.0–34.0)
MCHC: 33.1 g/dL (ref 30.0–36.0)
MCV: 86.1 fL (ref 80.0–100.0)
Platelets: 168 10*3/uL (ref 150–400)
RBC: 3.16 MIL/uL — ABNORMAL LOW (ref 4.22–5.81)
RDW: 15.1 % (ref 11.5–15.5)
WBC: 8.6 10*3/uL (ref 4.0–10.5)
nRBC: 0.2 % (ref 0.0–0.2)

## 2023-03-09 LAB — BASIC METABOLIC PANEL
Anion gap: 7 (ref 5–15)
BUN: 32 mg/dL — ABNORMAL HIGH (ref 6–20)
CO2: 21 mmol/L — ABNORMAL LOW (ref 22–32)
Calcium: 8.6 mg/dL — ABNORMAL LOW (ref 8.9–10.3)
Chloride: 104 mmol/L (ref 98–111)
Creatinine, Ser: 1.43 mg/dL — ABNORMAL HIGH (ref 0.61–1.24)
GFR, Estimated: 57 mL/min — ABNORMAL LOW (ref >60)
Glucose, Bld: 181 mg/dL — ABNORMAL HIGH (ref 70–99)
Potassium: 4.4 mmol/L (ref 3.5–5.1)
Sodium: 132 mmol/L — ABNORMAL LOW (ref 135–145)

## 2023-03-09 LAB — GLUCOSE, CAPILLARY: Glucose-Capillary: 136 mg/dL — ABNORMAL HIGH (ref 70–99)

## 2023-03-09 LAB — AEROBIC/ANAEROBIC CULTURE W GRAM STAIN (SURGICAL/DEEP WOUND)

## 2023-03-09 LAB — VANCOMYCIN, RANDOM: Vancomycin Rm: 37 ug/mL

## 2023-03-09 LAB — VANCOMYCIN, TROUGH: Vancomycin Tr: 36 ug/mL (ref 15–20)

## 2023-03-09 SURGERY — IRRIGATION AND DEBRIDEMENT EXTREMITY
Anesthesia: General | Site: Heel | Laterality: Left

## 2023-03-09 MED ORDER — MIDAZOLAM HCL 2 MG/2ML IJ SOLN
INTRAMUSCULAR | Status: AC
  Filled 2023-03-09: qty 2

## 2023-03-09 MED ORDER — POLYETHYLENE GLYCOL 3350 17 G PO PACK: 17.0000 g | PACK | Freq: Every day | ORAL | Status: DC | PRN

## 2023-03-09 MED ORDER — DOCUSATE SODIUM 100 MG PO CAPS: 100.0000 mg | ORAL_CAPSULE | Freq: Every day | ORAL | Status: DC

## 2023-03-09 MED ORDER — PHENOL 1.4 % MT LIQD: 1.0000 | OROMUCOSAL | Status: DC | PRN

## 2023-03-09 MED ORDER — HYDRALAZINE HCL 20 MG/ML IJ SOLN: 5.0000 mg | INTRAMUSCULAR | Status: DC | PRN

## 2023-03-09 MED ORDER — OXYCODONE HCL 5 MG PO TABS: 5.0000 mg | ORAL_TABLET | Freq: Once | ORAL | Status: DC | PRN

## 2023-03-09 MED ORDER — HYDROMORPHONE HCL 1 MG/ML IJ SOLN
0.2500 mg | INTRAMUSCULAR | Status: DC | PRN
  Administered 2023-03-09 (×2): 0.5 mg via INTRAVENOUS

## 2023-03-09 MED ORDER — ONDANSETRON HCL 4 MG/2ML IJ SOLN: 4.0000 mg | Freq: Four times a day (QID) | INTRAMUSCULAR | Status: DC | PRN

## 2023-03-09 MED ORDER — HYDROMORPHONE HCL 1 MG/ML IJ SOLN
INTRAMUSCULAR | Status: AC
  Filled 2023-03-09: qty 1

## 2023-03-09 MED ORDER — VANCOMYCIN VARIABLE DOSE PER UNSTABLE RENAL FUNCTION (PHARMACIST DOSING): Status: DC

## 2023-03-09 MED ORDER — ONDANSETRON HCL 4 MG/2ML IJ SOLN: 4.0000 mg | Freq: Once | INTRAMUSCULAR | Status: DC | PRN

## 2023-03-09 MED ORDER — FENTANYL CITRATE (PF) 250 MCG/5ML IJ SOLN
INTRAMUSCULAR | Status: AC
  Filled 2023-03-09: qty 5

## 2023-03-09 MED ORDER — DEXMEDETOMIDINE HCL IN NACL 80 MCG/20ML IV SOLN
INTRAVENOUS | Status: DC | PRN
  Administered 2023-03-09 (×2): 8 ug via INTRAVENOUS

## 2023-03-09 MED ORDER — LIDOCAINE 2% (20 MG/ML) 5 ML SYRINGE
INTRAMUSCULAR | Status: DC | PRN
  Administered 2023-03-09: 40 mg via INTRAVENOUS

## 2023-03-09 MED ORDER — PHENYLEPHRINE HCL-NACL 20-0.9 MG/250ML-% IV SOLN
INTRAVENOUS | Status: DC | PRN
  Administered 2023-03-09: 160 ug via INTRAVENOUS
  Administered 2023-03-09: 50 ug/min via INTRAVENOUS

## 2023-03-09 MED ORDER — MAGNESIUM SULFATE 2 GM/50ML IV SOLN: 2.0000 g | Freq: Every day | INTRAVENOUS | Status: DC | PRN

## 2023-03-09 MED ORDER — METOPROLOL TARTRATE 5 MG/5ML IV SOLN: 2.0000 mg | INTRAVENOUS | Status: DC | PRN

## 2023-03-09 MED ORDER — LABETALOL HCL 5 MG/ML IV SOLN: 10.0000 mg | INTRAVENOUS | Status: DC | PRN

## 2023-03-09 MED ORDER — OXYCODONE HCL 5 MG PO TABS: 5.0000 mg | ORAL_TABLET | ORAL | Status: DC | PRN

## 2023-03-09 MED ORDER — ONDANSETRON HCL 4 MG/2ML IJ SOLN
INTRAMUSCULAR | Status: DC | PRN
  Administered 2023-03-09: 4 mg via INTRAVENOUS

## 2023-03-09 MED ORDER — ACETAMINOPHEN 325 MG PO TABS: 325.0000 mg | ORAL_TABLET | Freq: Four times a day (QID) | ORAL | Status: DC | PRN

## 2023-03-09 MED ORDER — VITAMIN C 500 MG PO TABS
1000.0000 mg | ORAL_TABLET | Freq: Every day | ORAL | Status: DC
  Administered 2023-03-09 – 2023-03-12 (×4): 1000 mg via ORAL
  Filled 2023-03-09 (×4): qty 2

## 2023-03-09 MED ORDER — JUVEN PO PACK
1.0000 | PACK | Freq: Two times a day (BID) | ORAL | Status: DC
  Administered 2023-03-09 – 2023-03-12 (×6): 1 via ORAL
  Filled 2023-03-09 (×6): qty 1

## 2023-03-09 MED ORDER — OXYCODONE HCL 5 MG/5ML PO SOLN: 5.0000 mg | Freq: Once | ORAL | Status: DC | PRN

## 2023-03-09 MED ORDER — FENTANYL CITRATE (PF) 250 MCG/5ML IJ SOLN
INTRAMUSCULAR | Status: DC | PRN
  Administered 2023-03-09 (×2): 25 ug via INTRAVENOUS
  Administered 2023-03-09: 50 ug via INTRAVENOUS

## 2023-03-09 MED ORDER — RIVAROXABAN 2.5 MG PO TABS
2.5000 mg | ORAL_TABLET | Freq: Two times a day (BID) | ORAL | Status: DC
  Administered 2023-03-09 – 2023-03-12 (×6): 2.5 mg via ORAL
  Filled 2023-03-09 (×7): qty 1

## 2023-03-09 MED ORDER — 0.9 % SODIUM CHLORIDE (POUR BTL) OPTIME
TOPICAL | Status: DC | PRN
  Administered 2023-03-09: 1000 mL

## 2023-03-09 MED ORDER — AMISULPRIDE (ANTIEMETIC) 5 MG/2ML IV SOLN: 10.0000 mg | Freq: Once | INTRAVENOUS | Status: DC | PRN

## 2023-03-09 MED ORDER — ACETAMINOPHEN 10 MG/ML IV SOLN: 1000.0000 mg | Freq: Once | INTRAVENOUS | Status: DC | PRN

## 2023-03-09 MED ORDER — ZINC SULFATE 220 (50 ZN) MG PO CAPS
220.0000 mg | ORAL_CAPSULE | Freq: Every day | ORAL | Status: DC
  Administered 2023-03-09 – 2023-03-12 (×4): 220 mg via ORAL
  Filled 2023-03-09 (×4): qty 1

## 2023-03-09 MED ORDER — POTASSIUM CHLORIDE CRYS ER 20 MEQ PO TBCR: 20.0000 meq | EXTENDED_RELEASE_TABLET | Freq: Every day | ORAL | Status: DC | PRN

## 2023-03-09 MED ORDER — GUAIFENESIN-DM 100-10 MG/5ML PO SYRP: 15.0000 mL | ORAL_SOLUTION | ORAL | Status: DC | PRN

## 2023-03-09 MED ORDER — LACTATED RINGERS IV SOLN: INTRAVENOUS | Status: DC | PRN

## 2023-03-09 MED ORDER — SODIUM CHLORIDE 0.9 % IV SOLN: INTRAVENOUS | Status: DC

## 2023-03-09 MED ORDER — PROPOFOL 10 MG/ML IV BOLUS
INTRAVENOUS | Status: DC | PRN
  Administered 2023-03-09: 100 mg via INTRAVENOUS

## 2023-03-09 MED ORDER — PANTOPRAZOLE SODIUM 40 MG PO TBEC: 40.0000 mg | DELAYED_RELEASE_TABLET | Freq: Every day | ORAL | Status: DC

## 2023-03-09 MED ORDER — HYDROMORPHONE HCL 1 MG/ML IJ SOLN: 0.5000 mg | INTRAMUSCULAR | Status: DC | PRN

## 2023-03-09 MED ORDER — CEFAZOLIN SODIUM-DEXTROSE 2-4 GM/100ML-% IV SOLN: 2.0000 g | Freq: Three times a day (TID) | INTRAVENOUS | Status: DC

## 2023-03-09 MED ORDER — MIDAZOLAM HCL 2 MG/2ML IJ SOLN
INTRAMUSCULAR | Status: DC | PRN
  Administered 2023-03-09: 2 mg via INTRAVENOUS

## 2023-03-09 MED ORDER — OXYCODONE HCL 5 MG PO TABS: 10.0000 mg | ORAL_TABLET | ORAL | Status: DC | PRN

## 2023-03-09 MED ORDER — BISACODYL 5 MG PO TBEC: 5.0000 mg | DELAYED_RELEASE_TABLET | Freq: Every day | ORAL | Status: DC | PRN

## 2023-03-09 MED ORDER — MAGNESIUM CITRATE PO SOLN: 1.0000 | Freq: Once | ORAL | Status: DC | PRN

## 2023-03-09 MED ORDER — ALUM & MAG HYDROXIDE-SIMETH 200-200-20 MG/5ML PO SUSP: 15.0000 mL | ORAL | Status: DC | PRN

## 2023-03-09 SURGICAL SUPPLY — 39 items
BAG COUNTER SPONGE SURGICOUNT (BAG) IMPLANT
BAG SPNG CNTER NS LX DISP (BAG)
BLADE SAGITTAL (BLADE) ×1
BLADE SAW THK.89X75X18XSGTL (BLADE) IMPLANT
BLADE SURG 21 STRL SS (BLADE) ×1 IMPLANT
BNDG CMPR 5X6 CHSV STRCH STRL (GAUZE/BANDAGES/DRESSINGS)
BNDG COHESIVE 6X5 TAN ST LF (GAUZE/BANDAGES/DRESSINGS) IMPLANT
BNDG GAUZE DERMACEA FLUFF 4 (GAUZE/BANDAGES/DRESSINGS) ×2 IMPLANT
BNDG GZE DERMACEA 4 6PLY (GAUZE/BANDAGES/DRESSINGS) ×2
COVER SURGICAL LIGHT HANDLE (MISCELLANEOUS) ×2 IMPLANT
DRAPE DERMATAC (DRAPES) IMPLANT
DRAPE U-SHAPE 47X51 STRL (DRAPES) ×1 IMPLANT
DRSG ADAPTIC 3X8 NADH LF (GAUZE/BANDAGES/DRESSINGS) ×1 IMPLANT
DURAPREP 26ML APPLICATOR (WOUND CARE) ×1 IMPLANT
ELECT REM PT RETURN 9FT ADLT (ELECTROSURGICAL)
ELECTRODE REM PT RTRN 9FT ADLT (ELECTROSURGICAL) IMPLANT
GAUZE SPONGE 4X4 12PLY STRL (GAUZE/BANDAGES/DRESSINGS) ×1 IMPLANT
GLOVE BIOGEL PI IND STRL 9 (GLOVE) ×1 IMPLANT
GLOVE SURG ORTHO 9.0 STRL STRW (GLOVE) ×1 IMPLANT
GOWN STRL REUS W/ TWL XL LVL3 (GOWN DISPOSABLE) ×2 IMPLANT
GOWN STRL REUS W/TWL XL LVL3 (GOWN DISPOSABLE) ×2
GRAFT SKIN WND MICRO 38 (Tissue) IMPLANT
GRAFT SKIN WND OMEGA3 SB 7X10 (Tissue) IMPLANT
HANDPIECE INTERPULSE COAX TIP (DISPOSABLE)
KIT BASIN OR (CUSTOM PROCEDURE TRAY) ×1 IMPLANT
KIT TURNOVER KIT B (KITS) ×1 IMPLANT
MANIFOLD NEPTUNE II (INSTRUMENTS) ×1 IMPLANT
NS IRRIG 1000ML POUR BTL (IV SOLUTION) ×1 IMPLANT
PACK ORTHO EXTREMITY (CUSTOM PROCEDURE TRAY) ×1 IMPLANT
PAD ARMBOARD 7.5X6 YLW CONV (MISCELLANEOUS) ×2 IMPLANT
PAD NEG PRESSURE SENSATRAC (MISCELLANEOUS) IMPLANT
SET HNDPC FAN SPRY TIP SCT (DISPOSABLE) IMPLANT
STOCKINETTE IMPERVIOUS 9X36 MD (GAUZE/BANDAGES/DRESSINGS) IMPLANT
SUT ETHILON 2 0 PSLX (SUTURE) ×1 IMPLANT
SWAB COLLECTION DEVICE MRSA (MISCELLANEOUS) ×1 IMPLANT
SWAB CULTURE ESWAB REG 1ML (MISCELLANEOUS) IMPLANT
TOWEL GREEN STERILE (TOWEL DISPOSABLE) ×1 IMPLANT
TUBE CONNECTING 12X1/4 (SUCTIONS) ×1 IMPLANT
YANKAUER SUCT BULB TIP NO VENT (SUCTIONS) ×1 IMPLANT

## 2023-03-09 NOTE — Anesthesia Postprocedure Evaluation (Signed)
Anesthesia Post Note  Patient: Edward Rocha.  Procedure(s) Performed: LEFT HEEL DEBRIDEMENT (Left: Heel)     Patient location during evaluation: PACU Anesthesia Type: General Level of consciousness: awake and alert Pain management: pain level controlled Vital Signs Assessment: post-procedure vital signs reviewed and stable Respiratory status: spontaneous breathing, nonlabored ventilation, respiratory function stable and patient connected to nasal cannula oxygen Cardiovascular status: blood pressure returned to baseline and stable Postop Assessment: no apparent nausea or vomiting Anesthetic complications: no  No notable events documented.  Last Vitals:  Vitals:   03/09/23 0915 03/09/23 0942  BP: 107/71 115/73  Pulse: 93 95  Resp: 16 16  Temp:    SpO2: 99% 94%    Last Pain:  Vitals:   03/09/23 0900  TempSrc:   PainSc: 6                  Trevor Iha

## 2023-03-09 NOTE — Op Note (Signed)
03/09/2023  8:33 AM  PATIENT:  Edward Rocha.    PRE-OPERATIVE DIAGNOSIS:  Gangrene Left Heel with osteomyelitis of the calcaneus  POST-OPERATIVE DIAGNOSIS:  Same  PROCEDURE: Partial excision left calcaneus. Excision of skin and soft tissue muscle fascia and tendon left Achilles. Application Kerecis micro graft 38 cm and Kerecis sheet 7 x 10 cm  SURGEON:  [name], MD  PHYSICIAN ASSISTANT:None ANESTHESIA:   General  PREOPERATIVE INDICATIONS:  Edward Rocha. is a  57 y.o. male with a diagnosis of Gangrene Left Heel who failed conservative measures and elected for surgical management.    The risks benefits and alternatives were discussed with the patient preoperatively including but not limited to the risks of infection, bleeding, nerve injury, cardiopulmonary complications, the need for revision surgery, among others, and the patient was willing to proceed.  OPERATIVE IMPLANTS:   Implant Name Type Inv. Item Serial No. Manufacturer Lot No. LRB No. Used Action  GRAFT SKIN WND MICRO 38 - [id number] Tissue GRAFT SKIN WND MICRO 38  [employer] [id number] Left 1 Implanted  GRAFT SKIN WND OMEGA3 SB 7X10 - [id number] Tissue GRAFT SKIN WND OMEGA3 SB 7X10  [employer] [id number] Left 1 Implanted    @ENCIMAGES @  OPERATIVE FINDINGS: Postdebridement wound 15 cm x 4 cm x 2 cm deep.  Minimal petechial bleeding.  Tissue sent for cultures and bone sent for culture separately.  OPERATIVE PROCEDURE: Patient was brought the operating room and underwent a general anesthetic.  After adequate levels anesthesia obtained patient's left lower extremity was prepped using DuraPrep draped into a sterile field a timeout was called.  An elliptical incision was made around the ulcerative gangrenous skin tissue.  This left a wound defect that was 15 x 4 cm.  A oscillating saw was used to resect the os calcis 2 cm thick.  This was sent for separate cultures from the tissue cultures.  The wound was  irrigated with normal saline there was minimal petechial bleeding.  The wound bed was filled with Kerecis micro powder 38 cm this was then covered with a 7 x 10 sheets secured with staples.  The new wound VAC sponge size large was applied this had a good suction fit patient was extubated taken the PACU in stable condition.   DISCHARGE PLANNING:  Antibiotic duration: Postoperative antibiotics for 24 hours will adjust long-term antibiotics pending tissue cultures.  Weightbearing: Strict nonweightbearing on the left.  Patient is to wear the PRAFO 24 hours a day  Pain medication: Wound VAC on the left discharge with the Praveena plus portable wound VAC pump  Dressing care/ Wound VAC: Continue wound VAC dressing  Ambulatory devices: Walker or crutches  Discharge to: Discharge planning based on therapy recommendations.  Follow-up: In the office 1 week post operative.

## 2023-03-09 NOTE — Progress Notes (Signed)
Pharmacy note: post-op antibiotics  57 yo male with osteomyelitis s/o OR and ordered post-op ancef for surgical prophylaxis  He is on cefepime and vancomycin -vancomycin given ~ 6am and next dose at 6pm -cefepime due at 4pm  Plan -No further ancef need as he is on scheduled antibiotics  Harland German, PharmD Clinical Pharmacist **Pharmacist phone directory can now be found on [email or web address] ([email or web address]).  Listed under [facility].

## 2023-03-09 NOTE — Transfer of Care (Signed)
Immediate Anesthesia Transfer of Care Note  Patient: Edward Rocha.  Procedure(s) Performed: LEFT HEEL DEBRIDEMENT (Left: Heel)  Patient Location: PACU  Anesthesia Type:General  Level of Consciousness: awake  Airway & Oxygen Therapy: Patient Spontanous Breathing and Patient connected to face mask oxygen  Post-op Assessment: Report given to RN and Post -op Vital signs reviewed and stable  Post vital signs: Reviewed and stable  Last Vitals:  Vitals Value Taken Time  BP 103/70 03/09/23 0900  Temp 36.6 C 03/09/23 0839  Pulse 93 03/09/23 0914  Resp 16 03/09/23 0914  SpO2 99 % 03/09/23 0914  Vitals shown include unvalidated device data.  Last Pain:  Vitals:   03/09/23 0900  TempSrc:   PainSc: 6       Patients Stated Pain Goal: 3 (03/09/23 0900)  Complications: No notable events documented.

## 2023-03-09 NOTE — Interval H&P Note (Signed)
History and Physical Interval Note:  03/09/2023 6:30 AM  Edward Rocha.  has presented today for surgery, with the diagnosis of Gangrene Left Heel.  The various methods of treatment have been discussed with the patient and family. After consideration of risks, benefits and other options for treatment, the patient has consented to  Procedure(s): LEFT HEEL DEBRIDEMENT (Left) as a surgical intervention.  The patient's history has been reviewed, patient examined, no change in status, stable for surgery.  I have reviewed the patient's chart and labs.  Questions were answered to the patient's satisfaction.     [name]

## 2023-03-09 NOTE — Anesthesia Procedure Notes (Signed)
Procedure Name: LMA Insertion Date/Time: 03/09/2023 8:00 AM  Performed by: Loleta Zaxton Angerer, CRNAPre-anesthesia Checklist: Patient identified, Patient being monitored, Timeout performed, Emergency Drugs available and Suction available Patient Re-evaluated:Patient Re-evaluated prior to induction Oxygen Delivery Method: Circle system utilized Preoxygenation: Pre-oxygenation with 100% oxygen Induction Type: IV induction Ventilation: Mask ventilation without difficulty LMA: LMA inserted LMA Size: 5.0 Tube type: Oral Number of attempts: 1 Placement Confirmation: positive ETCO2 and breath sounds checked- equal and bilateral Tube secured with: Tape Dental Injury: Teeth and Oropharynx as per pre-operative assessment

## 2023-03-09 NOTE — Progress Notes (Signed)
Pharmacy Antibiotic Note  Edward Rocha. is a 57 y.o. male admitted on 02/27/2023 with wound infection s/p  left common femoral endarterectomy with profundoplasty and redo fem-tib bypass. VVS concerned for infection and cultures collected intra-op on 5/6.  Pharmacy has been consulted for Vancomycin and cefepime dosing. Also on Flagyl per MD. SCr relatively stable this admission at 1.43.  Vancomycin levels drawn today at steady state - AUC resulted supratherapeutic at 892 on current dose 1g IV q12h.  Plan: Hold vancomycin with supratherapeutic AUC - f/u random level in 48 hrs and re-dose as appropriate Cefepime 2g IV q8h Flagyl 500mg  IV q12h Monitor clinical progress, c/s, renal function F/u antibiotic plan/LOT   Height: 6\' 1"  (185.4 cm) Weight: 83.9 kg (185 lb) IBW/kg (Calculated) : 79.9  Temp (24hrs), Avg:98.1 F (36.7 C), Min:97.6 F (36.4 C), Max:98.8 F (37.1 C)  Recent Labs  Lab 03/05/23 0117 03/06/23 0330 03/07/23 0123 03/08/23 0204 03/09/23 0122 03/09/23 1257 03/09/23 1829  WBC 7.3 15.3* 14.5* 11.2* 8.6  --   --   CREATININE 1.28* 1.33* 1.50* 1.48* 1.43*  --   --   VANCOTROUGH  --   --   --   --   --   --  36*  VANCORANDOM  --   --   --   --   --  37  --      Estimated Creatinine Clearance: 63.6 mL/min (A) (by C-G formula based on SCr of 1.43 mg/dL (H)).    Allergies  Allergen Reactions   Other Other (See Comments)    Pt received platelets and had a bad reaction from infusion     Antimicrobials this admission: 5/6 cefepime >>  5/6 Vancomycin >> 5/6 Flagyl  >>   Microbiology results: 5/6 Tissue Cx: enterobacter cloacae (pan-s) 5/10 Tissue, L heel: few GPC, GNR, GPR, squamous epithelial cells   Leia Alf, PharmD, BCPS Please check AMION for all [facility] contact numbers Clinical Pharmacist 03/09/2023 8:19 PM

## 2023-03-09 NOTE — Progress Notes (Signed)
[facility] [place]  Subjective  -no complaints.  Underwent heel debridement with Dr. Lajoyce Corners today.   Objective 115/73 95 98.5 F (36.9 C) 16 94%  Intake/Output Summary (Last 24 hours) at 03/09/2023 1121 Last data filed at 03/09/2023 0830 Gross per 24 hour  Intake 1620.4 ml  Output 2100 ml  Net -479.6 ml    Left groin and left calf incision healing Left second toe amputation healing Brisk DP signal in the left foot  Laboratory Lab Results: Recent Labs    03/08/23 0204 03/09/23 0122  WBC 11.2* 8.6  HGB 8.8* 9.0*  HCT 26.5* 27.2*  PLT 160 168   BMET Recent Labs    03/08/23 0204 03/09/23 0122  NA 127* 132*  K 4.6 4.4  CL 103 104  CO2 18* 21*  GLUCOSE 150* 181*  BUN 39* 32*  CREATININE 1.48* 1.43*  CALCIUM 8.4* 8.6*    COAG Lab Results  Component Value Date   INR 1.2 12/12/2021   INR 1.3 (H) 12/07/2021   INR 1.7 (H) 08/27/2019   No results found for: "PTT"  Assessment/Planning:  Now postop day 4 status post left common femoral endarterectomy with profundoplasty and bovine pericardial patch and a redo left fem-posterior tibial bypass with PTFE.  Underwent left common iliac stenting last week.  Has a brisk dorsalis pedis signal in the foot.  Underwent heel debridement with Dr. Lajoyce Corners today.  Continue aspirin and statin for risk reduction.  Plavix when able.  OR cultures growing Enterobacter from toe amputation.  Edward Rocha 03/09/2023 11:21 AM --

## 2023-03-09 NOTE — Progress Notes (Signed)
Subjective:  No significant overnight events. Patient feeling well after surgery, though he is still sleepy from the anesthesia. He reports his pain was a 7-8 out of 10 but is improving after receiving pain medications. He reports having a BM yesterday. He denies chest pain and shortness of breath.  Objective:  Vital signs in last 24 hours: Vitals:   03/09/23 0845 03/09/23 0900 03/09/23 0915 03/09/23 0942  BP: 109/72 103/70 107/71 115/73  Pulse: 96 100 93 95  Resp: 20 16 16 16   Temp:  98.5 F (36.9 C)    TempSrc:      SpO2: 98% 93% 99% 94%  Weight:      Height:       Intake/Output Summary (Last 24 hours) at 03/09/2023 1000 Last data filed at 03/09/2023 0830 Gross per 24 hour  Intake 1620.4 ml  Output 2375 ml  Net -754.6 ml   Physical Exam: Constitutional: Middle-aged male resting in bed. Appears comfortable and in no acute distress.  Cardiovascular: Regular rate, regular rhythm. 2/6 systolic murmur best appreciated at the left upper sternal border. Pulmonary: Normal respiratory effort. No wheezes, rales, rhonchi, or crackles.   Abdominal: Soft. Non-distended. Normal bowel sounds.    Neurological: Alert and oriented to person, place, and time. Skin: Warm and dry. Bandage on left heel in place. BLE chronic dry skin changes.   Assessment/Plan:  Principal Problem:   Critical lower limb ischemia (HCC) Active Problems:   Type 2 diabetes mellitus with circulatory disorder (HCC)   PVD (peripheral vascular disease) (HCC)   Stage 3a chronic kidney disease (CKD) (HCC)   Open wound of left foot   Gangrene of left foot (HCC)   Osteomyelitis of foot, left, acute (HCC)  Edward Rocha. Is a 57 y.o. male living with PAD, HTN, T2DM, CKD stage 3a, CHF, hypothyroidism, and cirrhosis who presented to the ED with foot pain and left heel wound and admitted for critical ischemia of left leg and management of left heel wound.   # PAD # Left heel wound S/p angioplasty with stent placement  of left common iliac artery on 5/1. S/p L common femoral endarterectomy, redo femoral-tibial bypass, partial L second toe amputation, and debridement of L heel wound on 5/6. S/p partial excision L calcaneus, excision of L heel skin, soft tissue, muscle fascia, and Achilles tendon on 5/10. Culture of toe bone biopsy with rare Enterobacter cloacae with sensitivity to several antibiotics including cefepime. Will continue antibiotics (cefepime, vancomycin, Flagyl) per pharmacy until cultures from calcaneus return. Will continue holding Xarelto today but plan to discuss with orthopedics when safe to resume post-operatively. Will continue medical therapy with atorvastatin and aspirin.  - Appreciate vascular recommendations - Appreciate orthopedics recommendations - Antibiotics (cefepime, vancomycin, Flagyl) per pharmacy - Hold Xarelto - Atorvastatin 80 mg - Aspirin 81 mg  # Non-ischemic cardiomyopathy # Pulmonary hypertension # HFrEF, EF 20-25% # CAD Net negative output of -1.8L yesterday. Will continue Lasix since renal function and Na stable. Encouraged patient to continue hydrating with PO fluids. Will continue holding Entresto and spironolactone. - Continue Lasix 40 mg  - Hold Entresto 24-26 mg BID - Hold spironolactone 12.5 mg daily  - Farxiga 10 mg  - Metoprolol succinate 50 mg daily - Atorvastatin 80 mg - Aspirin 81 mg   # Type 2 Diabetes  Glucose 136 today. Patient received oxycodone 5 mg x 3 in past 24 hours. Will continue gabapentin 500 mg TID. - Farxiga 10 mg daily - Gabapentin 500 mg  TID - Oxycodone 5 mg Q6 PRN  # AKI on CKD stage 3a  Creatinine remains elevated above baseline at 1.43 today. Will continue Lasix given renal function and Na stable. Will hold Entresto and spironolactone. Will continue monitoring renal function. - Trend BMP   # Hypertension  BP stable with MAPs in the 80-90s overnight. - Metoprolol succinate 50 mg daily   Diet: HH/CM Bowel: Senna BID VTE:  Heparin IVF: None Code: Full PT/OT recs: HH PT/OT LOS: day 10   Prior to Admission Living Arrangement: home with self Anticipated Discharge Location: TBD Barriers to Discharge: continued management  Edward Rocha, Medical Student 03/09/2023, 10:00 AM

## 2023-03-09 NOTE — Progress Notes (Signed)
Critical vancomycin called to pharmacist she is aware and working on new orders

## 2023-03-09 NOTE — Progress Notes (Signed)
Physical Therapy Treatment Patient Details Name: Edward Rocha. MRN: [id number] DOB: September 09, 1966 Today's Date: 03/09/2023   History of Present Illness Pt is a 57 year old male admitted on 02/27/23 with foot pain and left heel wound. S/p 03/05/23 Redo exposure of left common femoral artery, left common femoral endarterectomy,redo left common femoral to posterior tibial bypass, partial left second toe amputation, debridement of left heel wound. Calcaneal debridement 5/10, now NWB. PMH significant for CAD, PAD, HTN, T2DM, CKD3a, HFrEF, hypothyroidism, and cirrhosis.    PT Comments    Pt goals updated according to new NWB status on LLE. Pt is able to transfer and hop short distances while maintaining NWB through LLE. PT provides recommendation to utilize manual wheelchair for most household mobility to reduce falls risk at the time of discharge. Pt will benefit from stair training next session to negotiate one step to enter home. PT continues to recommend discharge home when medically ready.   Recommendations for follow up therapy are one component of a multi-disciplinary discharge planning process, led by the attending physician.  Recommendations may be updated based on patient status, additional functional criteria and insurance authorization.  Follow Up Recommendations       Assistance Recommended at Discharge PRN  Patient can return home with the following A little help with bathing/dressing/bathroom;Assistance with cooking/housework;Assist for transportation;Help with stairs or ramp for entrance   Equipment Recommendations  None recommended by PT (owns necessary DME)    Recommendations for Other Services       Precautions / Restrictions Precautions Precautions: Fall Precaution Comments: PRAFO boot at all times, PT calls ortho tech to notify of order. Boot not present on eval. Wound vac Restrictions Weight Bearing Restrictions: Yes LLE Weight Bearing: Non weight bearing      Mobility  Bed Mobility Overal bed mobility: Modified Independent                  Transfers Overall transfer level: Needs assistance Equipment used: Rolling walker (2 wheels) Transfers: Sit to/from Stand Sit to Stand: Min guard                Ambulation/Gait Ambulation/Gait assistance: Supervision Gait Distance (Feet): 50 Feet Assistive device: Rolling walker (2 wheels) Gait Pattern/deviations:  (hop-to gait) Gait velocity: reduced Gait velocity interpretation: <1.8 ft/sec, indicate of risk for recurrent falls   General Gait Details: slowed hop-to gait, good maintenance of NWB through LLE   Stairs             Wheelchair Mobility    Modified Rankin (Stroke Patients Only)       Balance Overall balance assessment: Needs assistance Sitting-balance support: No upper extremity supported, Feet supported Sitting balance-Leahy Scale: Good     Standing balance support: Bilateral upper extremity supported, Reliant on assistive device for balance Standing balance-Leahy Scale: Poor                              Cognition Arousal/Alertness: Awake/alert Behavior During Therapy: WFL for tasks assessed/performed Overall Cognitive Status: Within Functional Limits for tasks assessed                                          Exercises      General Comments General comments (skin integrity, edema, etc.): VSS on RA      Pertinent Vitals/Pain Pain  Assessment Pain Assessment: Faces Faces Pain Scale: Hurts little more Pain Location: LLE Pain Descriptors / Indicators: Sore Pain Intervention(s): Monitored during session    Home Living                          Prior Function            PT Goals (current goals can now be found in the care plan section) Acute Rehab PT Goals Patient Stated Goal: to go home PT Goal Formulation: With patient Time For Goal Achievement: 03/23/23 Potential to Achieve Goals:  Good Progress towards PT goals: Progressing toward goals (goals updated based on change in WB status)    Frequency    Min 2X/week      PT Plan Current plan remains appropriate    Co-evaluation              AM-PAC PT "6 Clicks" Mobility   Outcome Measure  Help needed turning from your back to your side while in a flat bed without using bedrails?: None Help needed moving from lying on your back to sitting on the side of a flat bed without using bedrails?: None Help needed moving to and from a bed to a chair (including a wheelchair)?: A Little Help needed standing up from a chair using your arms (e.g., wheelchair or bedside chair)?: A Little Help needed to walk in hospital room?: A Little Help needed climbing 3-5 steps with a railing? : A Little 6 Click Score: 20    End of Session Equipment Utilized During Treatment: Gait belt Activity Tolerance: Patient tolerated treatment well Patient left: in bed;with call bell/phone within reach;with bed alarm set Nurse Communication: Mobility status PT Visit Diagnosis: Other abnormalities of gait and mobility (R26.89);Pain Pain - Right/Left: Left Pain - part of body: Ankle and joints of foot     Time: 4098-1191 PT Time Calculation (min) (ACUTE ONLY): 21 min  Charges:                        Arlyss Gandy, PT, DPT Acute Rehabilitation Office [phone]    Arlyss Gandy 03/09/2023, 4:03 PM

## 2023-03-09 NOTE — Progress Notes (Signed)
Orthopedic Tech Progress Note Patient Details:  Edward Rocha. 04-Apr-1966 [id number]  Ortho Devices Type of Ortho Device: Prafo boot/shoe Ortho Device/Splint Location: LLE Ortho Device/Splint Interventions: Application, Ordered   Post Interventions Patient Tolerated: Well  Lovett Calender 03/09/2023, 5:28 PM

## 2023-03-10 ENCOUNTER — Encounter (HOSPITAL_COMMUNITY): Payer: Self-pay | Admitting: Orthopedic Surgery

## 2023-03-10 DIAGNOSIS — E1169 Type 2 diabetes mellitus with other specified complication: Secondary | ICD-10-CM

## 2023-03-10 LAB — BASIC METABOLIC PANEL
Anion gap: 8 (ref 5–15)
BUN: 30 mg/dL — ABNORMAL HIGH (ref 6–20)
CO2: 20 mmol/L — ABNORMAL LOW (ref 22–32)
Calcium: 8.5 mg/dL — ABNORMAL LOW (ref 8.9–10.3)
Chloride: 105 mmol/L (ref 98–111)
Creatinine, Ser: 1.37 mg/dL — ABNORMAL HIGH (ref 0.61–1.24)
GFR, Estimated: 60 mL/min — ABNORMAL LOW (ref >60)
Glucose, Bld: 130 mg/dL — ABNORMAL HIGH (ref 70–99)
Potassium: 4.3 mmol/L (ref 3.5–5.1)
Sodium: 133 mmol/L — ABNORMAL LOW (ref 135–145)

## 2023-03-10 LAB — CBC
HCT: 24.8 % — ABNORMAL LOW (ref 39.0–52.0)
Hemoglobin: 7.9 g/dL — ABNORMAL LOW (ref 13.0–17.0)
MCH: 28 pg (ref 26.0–34.0)
MCHC: 31.9 g/dL (ref 30.0–36.0)
MCV: 87.9 fL (ref 80.0–100.0)
Platelets: 145 10*3/uL — ABNORMAL LOW (ref 150–400)
RBC: 2.82 MIL/uL — ABNORMAL LOW (ref 4.22–5.81)
RDW: 15.4 % (ref 11.5–15.5)
WBC: 9.1 10*3/uL (ref 4.0–10.5)
nRBC: 0.3 % — ABNORMAL HIGH (ref 0.0–0.2)

## 2023-03-10 LAB — GLUCOSE, CAPILLARY
Glucose-Capillary: 179 mg/dL — ABNORMAL HIGH (ref 70–99)
Glucose-Capillary: 118 mg/dL — ABNORMAL HIGH (ref 70–99)

## 2023-03-10 LAB — AEROBIC/ANAEROBIC CULTURE W GRAM STAIN (SURGICAL/DEEP WOUND)

## 2023-03-10 MED ORDER — INSULIN ASPART 100 UNIT/ML IJ SOLN
0.0000 [IU] | Freq: Three times a day (TID) | INTRAMUSCULAR | Status: DC
  Administered 2023-03-10: 3 [IU] via SUBCUTANEOUS
  Administered 2023-03-11 – 2023-03-12 (×4): 2 [IU] via SUBCUTANEOUS

## 2023-03-10 MED ORDER — SACUBITRIL-VALSARTAN 24-26 MG PO TABS
1.0000 | ORAL_TABLET | Freq: Two times a day (BID) | ORAL | Status: DC
  Administered 2023-03-10 – 2023-03-12 (×5): 1 via ORAL
  Filled 2023-03-10 (×5): qty 1

## 2023-03-10 NOTE — Progress Notes (Addendum)
  Progress Note    03/10/2023 7:59 AM 1 Day Post-Op  Subjective:  no complaints   Vitals:   03/09/23 2349 03/10/23 0331  BP: 118/86 (!) 123/95  Pulse:    Resp: 20 20  Temp: 97.7 F (36.5 C) 98.4 F (36.9 C)  SpO2: 100% 92%   Physical Exam: Lungs:  non labored Incisions:  L groin and popliteal c/d/i Extremities:  brisk L DP by doppler Neurologic: A&O  CBC    Component Value Date/Time   WBC 9.1 03/10/2023 0111   RBC 2.82 (L) 03/10/2023 0111   HGB 7.9 (L) 03/10/2023 0111   HCT 24.8 (L) 03/10/2023 0111   PLT 145 (L) 03/10/2023 0111   MCV 87.9 03/10/2023 0111   MCH 28.0 03/10/2023 0111   MCHC 31.9 03/10/2023 0111   RDW 15.4 03/10/2023 0111   LYMPHSABS 1.1 02/27/2023 0858   MONOABS 0.6 02/27/2023 0858   EOSABS 0.1 02/27/2023 0858   BASOSABS 0.0 02/27/2023 0858    BMET    Component Value Date/Time   NA 133 (L) 03/10/2023 0111   K 4.3 03/10/2023 0111   CL 105 03/10/2023 0111   CO2 20 (L) 03/10/2023 0111   GLUCOSE 130 (H) 03/10/2023 0111   BUN 30 (H) 03/10/2023 0111   CREATININE 1.37 (H) 03/10/2023 0111   CALCIUM 8.5 (L) 03/10/2023 0111   GFRNONAA 60 (L) 03/10/2023 0111   GFRAA 54 (L) 10/19/2019 0616    INR    Component Value Date/Time   INR 1.2 12/12/2021 0015     Intake/Output Summary (Last 24 hours) at 03/10/2023 0759 Last data filed at 03/10/2023 0700 Gross per 24 hour  Intake 520 ml  Output 1700 ml  Net -1180 ml     Assessment/Plan:  57 y.o. male is s/p  left common femoral endarterectomy with profundoplasty and bovine pericardial patch and a redo left fem-posterior tibial bypass with PTFE  1 Day Post-Op   L foot warm with brisk DP signal by doppler.  Underwent L ankle debridement with placement of skin substitute and wound vac.  Continue aspirin and statin.  Xrelto restarted last night.  Continue abx based on culture data.  OOB with therapy teams   Emilie Rutter, PA-C [facility] [phone] 03/10/2023 7:59 AM  I  have independently interviewed and examined patient and agree with PA assessment and plan above.   Weaver Tweed C. Randie Heinz, MD [facility] [place] Office: [phone] Pager: [phone]

## 2023-03-10 NOTE — Progress Notes (Signed)
HD#10 Subjective:  Overnight Events: none  Patient assessed at bedside this AM. He reports that pain in left extremity is well controlled with current medications. He had a bowel movement yesterday.   Pt is updated on the plan for today, and all questions and concerns are addressed.   Objective:  Vital signs in last 24 hours: Vitals:   03/09/23 1207 03/09/23 2000 03/09/23 2349 03/10/23 0331  BP: 114/81 119/86 118/86 (!) 123/95  Pulse:      Resp: 16 19 20 20   Temp: 98.1 F (36.7 C) 97.6 F (36.4 C) 97.7 F (36.5 C) 98.4 F (36.9 C)  TempSrc: Oral Oral Oral Oral  SpO2: 92% 98% 100% 92%  Weight:      Height:       Supplemental O2: Room Air SpO2: 92 % O2 Flow Rate (L/min): 6 L/min   Physical Exam:  Constitutional: well-appearing, in no acute distress Cardiovascular: regular rate and rhythm, no m/r/g Pulmonary/Chest: normal work of breathing on room air, lungs clear to auscultation bilaterally Abdominal: soft, non-tender, non-distended MSK: off-loading boot on left extremity with wound vac in place, 2nd toe amputation with some serosanguinous fluid Skin: warm and dry Psych: pleasant  Filed Weights   02/27/23 0855 03/05/23 0951  Weight: 83.9 kg 83.9 kg     Intake/Output Summary (Last 24 hours) at 03/10/2023 1610 Last data filed at 03/10/2023 0400 Gross per 24 hour  Intake 520 ml  Output 700 ml  Net -180 ml   Net IO Since Admission: -3,960.9 mL [03/10/23 0632]  Pertinent Labs:    Latest Ref Rng & Units 03/10/2023    1:11 AM 03/09/2023    1:22 AM 03/08/2023    2:04 AM  CBC  WBC 4.0 - 10.5 K/uL 9.1  8.6  11.2   Hemoglobin 13.0 - 17.0 g/dL 7.9  9.0  8.8   Hematocrit 39.0 - 52.0 % 24.8  27.2  26.5   Platelets 150 - 400 K/uL 145  168  160        Latest Ref Rng & Units 03/10/2023    1:11 AM 03/09/2023    1:22 AM 03/08/2023    2:04 AM  CMP  Glucose 70 - 99 mg/dL 960  454  098   BUN 6 - 20 mg/dL 30  32  39   Creatinine 0.61 - 1.24 mg/dL 1.19  1.47  8.29    Sodium 135 - 145 mmol/L 133  132  127   Potassium 3.5 - 5.1 mmol/L 4.3  4.4  4.6   Chloride 98 - 111 mmol/L 105  104  103   CO2 22 - 32 mmol/L 20  21  18    Calcium 8.9 - 10.3 mg/dL 8.5  8.6  8.4     Assessment/Plan:   Principal Problem:   Critical lower limb ischemia (HCC) Active Problems:   Type 2 diabetes mellitus with circulatory disorder (HCC)   PVD (peripheral vascular disease) (HCC)   Stage 3a chronic kidney disease (CKD) (HCC)   Open wound of left foot   Gangrene of left foot (HCC)   Osteomyelitis of foot, left, acute (HCC)   Patient Summary: Edward Rocha. is a 57 y.o. with a pertinent PMH of PAD, t2dm, CKD stge 3a, who presented with foot pain for critical lower limb ischemia c/b left heel wound on HD#10. He is s/p angioplasty with stent placement of left common iliac artery on 5/1. S/p L common femoral endarterectomy, redo femoral-tibial bypass, partial L  second toe amputation, and debridement of L heel wound on 5/6 and partial calcaneus excision and excision of necrotic Achilles tendon 5/10.  Left heel wound s/p partial calcaneus excision and excision of achilles tendon with skin substitute 5/10 S/P left 2nd toe amputation 5/6 He reports that pain is well controlled this morning. He was able to transition to chair with assistance this morning. 150cc in wound canister, plan to continue wound vac at discharge. Calcaneal tissue growing gram+ cocci, gram- rods, and gram+rods. Cultures from 2nd left toe with E cloaecae that is pan sensitive. He will need wound vac at discharge with close follow-up with Dr. Lajoyce Corners. PT recommended Home health with no additional DME needed. -continue cefepime, vancomycin, and flagyl -f/u calcaneal culture to de-escalate abx  PAD Post-up day 5 left common femoral endarterectomy with profundoplasty and repeat left fem posterior tib bypass with graft. -appreciate vascular recommendations -ASA -atorvastatin 80 mg -apixaban 2.5 mg  BID  Non-ischemic cardiomyopathy Pulmonary hypertension HFrEF, EF 20-25% CAD His creatinine is trending down to 1.3, with baseline around 1-1.1. No output recorded yesterday as he spent part of day in OR. GDMT is gradually be added back on as renal function recovers from likely contrast nephropathy and cardiorenal syndrome. -continue lasix 40 mg, farxiga 10 mg, and metoprolol succinate 50 mg daily -add entresto 24-26 mg BID -hold spironolactone 12.5 mg -strict I and Os -Daily weights -trend BMP  Type 2 Diabetes  Blood sugars are well controlled. -farxiga 10 mg qd -SSI  AKI on CKD stage 3a Renal function trending down to 1.3 this morning. Will continue to add back GDMT.  -trend BMP  Diet: Carb-Modified VTE: NOAC Code: Full PT/OT recs: Home Health, none.  Dispo: Anticipated discharge to Home  pending calcaneal culture results.   Valentin Benney M. Marymargaret Kirker, D.O.  Internal Medicine Resident, PGY-2 [facility]  Pager: [phone] 6:32 AM, 03/10/2023   **Please contact the on call pager after 5 pm and on weekends at [phone].**

## 2023-03-10 NOTE — Progress Notes (Signed)
Inpatient Rehab Admissions Coordinator:    CIR consult received. Pt. Ambulating at supervision level and PT/OT recommend HH. CIR will sign off.   Megan Salon, MS, CCC-SLP Rehab Admissions Coordinator  [phone] (celll) [phone] (office)

## 2023-03-10 NOTE — Plan of Care (Signed)
  Problem: Education: Goal: Knowledge of General Education information will improve Description: Including pain rating scale, medication(s)/side effects and non-pharmacologic comfort measures Outcome: Progressing   Problem: Clinical Measurements: Goal: Respiratory complications will improve Outcome: Progressing   Problem: Nutrition: Goal: Adequate nutrition will be maintained Outcome: Progressing   Problem: Coping: Goal: Level of anxiety will decrease Outcome: Progressing   Problem: Pain Managment: Goal: General experience of comfort will improve Outcome: Progressing   

## 2023-03-10 NOTE — Progress Notes (Signed)
Patient ID: Edward Rocha., male   DOB: 05/10/1966, 57 y.o.   MRN: [id number] Patient is postoperative day 1 partial calcaneus excision and excision of necrotic Achilles tendon.  Soft tissue cultures are showing gram-positive cocci and gram-negative rods and gram-positive rods.  There is 150 cc in the wound VAC canister.  Will continue the wound VAC at discharge with the Praveena plus portable wound VAC pump.  Patient is in a PRAFO and will need to wear this 24 hours a day.

## 2023-03-10 NOTE — Progress Notes (Signed)
Physical Therapy Treatment Patient Details Name: Edward Rocha. MRN: [id number] DOB: 1966/09/16 Today's Date: 03/10/2023   History of Present Illness Pt is a 56 year old male admitted on 02/27/23 with foot pain and left heel wound. S/p 03/05/23 Redo exposure of left common femoral artery, left common femoral endarterectomy,redo left common femoral to posterior tibial bypass, partial left second toe amputation, debridement of left heel wound. Calcaneal debridement 5/10, now NWB. PMH significant for CAD, PAD, HTN, T2DM, CKD3a, HFrEF, hypothyroidism, and cirrhosis.    PT Comments    Pt received up in room with RN assist, mobilizing to bathroom. Pt needing up to +2 minA for sit>stand from lower height toilet seat<>RW to ensure LLE remains NWB during transfer. Pt at times appears to be resting LLE on floor during some functional tasks, given cues for NWB rather than TDWB to ensure he protects healing sites. PRAFO boot donned throughout, adjusted at end of session to ensure proper orientation as foot plate had shifted away from his forefoot. Wound vac and incisions appear c/d/I. Pt reports modified RPE 4-5/10 after stair training and short household distance gait trials. Pt continues to benefit from PT services to progress toward functional mobility goals, continue to recommend HHPT.  Recommendations for follow up therapy are one component of a multi-disciplinary discharge planning process, led by the attending physician.  Recommendations may be updated based on patient status, additional functional criteria and insurance authorization.  Follow Up Recommendations       Assistance Recommended at Discharge PRN  Patient can return home with the following A little help with bathing/dressing/bathroom;Assistance with cooking/housework;Assist for transportation;Help with stairs or ramp for entrance   Equipment Recommendations  None recommended by PT (owns necessary DME, would benefit from having a ramp  installed for his stair entry but he reports no concerns, he says he has ~3 walkers at home)    Recommendations for Other Services       Precautions / Restrictions Precautions Precautions: Fall Precaution Comments: PRAFO boot at all times. Wound vac Restrictions Weight Bearing Restrictions: Yes LLE Weight Bearing: Non weight bearing     Mobility  Bed Mobility                    Transfers Overall transfer level: Needs assistance Equipment used: Rolling walker (2 wheels) Transfers: Sit to/from Stand Sit to Stand: Min guard, Min assist, +2 physical assistance           General transfer comment: from low toilet height, pt using rail and minA +2 for one person to elevate his LLE to prevent WB through the foot and RN providing light support at gait belt for pt to rise/steady. For stand>sit, pt only needing +1 assist to recliner chair and good recall of keeping LLE extended to prevent WB    Ambulation/Gait Ambulation/Gait assistance: Supervision, Min guard Gait Distance (Feet): 20 Feet Assistive device: Rolling walker (2 wheels) Gait Pattern/deviations:  (hop-to gait) Gait velocity: reduced     General Gait Details: slowed hop-to gait, fair maintenance of NWB through LLE needs cues to keep LLE elevated with fatigue/while turning prior to sitting. At time pt appears to be performing TDWB but difficult to tell with PRAFO   Stairs Stairs: Yes Stairs assistance: Min assist, +2 safety/equipment Stair Management: With walker, Forwards, Step to pattern (hop-to) Number of Stairs: 1 General stair comments: cues for sequencing pattern due to pt now NWB on LLE, pt given visual/verbal demo prior to and good following of  cues, pt was a bit too far from end of platform (7" platform step in room) with step-down and would benefit from additional practice if able next session.   Wheelchair Mobility    Modified Rankin (Stroke Patients Only)       Balance Overall balance  assessment: Needs assistance Sitting-balance support: No upper extremity supported, Feet supported Sitting balance-Leahy Scale: Good     Standing balance support: Bilateral upper extremity supported, Reliant on assistive device for balance Standing balance-Leahy Scale: Poor Standing balance comment: heavy reliance on RW now given NWB LLE                            Cognition Arousal/Alertness: Awake/alert Behavior During Therapy: WFL for tasks assessed/performed Overall Cognitive Status: Within Functional Limits for tasks assessed                                 General Comments: pleasantly cooperative, able to make his needs known        Exercises Other Exercises Other Exercises: verbal/visual demo for seated BLE HEP, pt reports compliance per previous session.    General Comments General comments (skin integrity, edema, etc.): SpO2 WFL on RA, HR WFL per monitor; no acute s/sx distress, wound vac and incisions appear c/d/i      Pertinent Vitals/Pain Pain Assessment Pain Assessment: Faces Faces Pain Scale: Hurts little more Pain Location: LLE (anterior sx site) Pain Descriptors / Indicators: Sore, Tightness Pain Intervention(s): Limited activity within patient's tolerance, Monitored during session, Repositioned, Ice applied (pt instructed on ice 10 mins on, 20-30 mins off PRN for edema/pain relief)     PT Goals (current goals can now be found in the care plan section) Acute Rehab PT Goals Patient Stated Goal: to go home PT Goal Formulation: With patient Time For Goal Achievement: 03/23/23 Progress towards PT goals: Progressing toward goals    Frequency    Min 2X/week      PT Plan Current plan remains appropriate       AM-PAC PT "6 Clicks" Mobility   Outcome Measure  Help needed turning from your back to your side while in a flat bed without using bedrails?: None Help needed moving from lying on your back to sitting on the side of a  flat bed without using bedrails?: None Help needed moving to and from a bed to a chair (including a wheelchair)?: A Little Help needed standing up from a chair using your arms (e.g., wheelchair or bedside chair)?: A Little Help needed to walk in hospital room?: A Little Help needed climbing 3-5 steps with a railing? : A Lot (+2 safety) 6 Click Score: 19    End of Session Equipment Utilized During Treatment: Gait belt Activity Tolerance: Patient tolerated treatment well Patient left: with call bell/phone within reach;in chair;with nursing/sitter in room;Other (comment) (ice pack given for his LLE) Nurse Communication: Mobility status PT Visit Diagnosis: Other abnormalities of gait and mobility (R26.89);Pain Pain - Right/Left: Left Pain - part of body: Ankle and joints of foot     Time: 1257-1310 PT Time Calculation (min) (ACUTE ONLY): 13 min  Charges:  $Gait Training: 8-22 mins                     Dalyn Becker P., PTA Acute Rehabilitation Services Secure Chat Preferred 9a-5:30pm Office: [phone]    [facility] 03/10/2023, 3:58 PM

## 2023-03-11 LAB — CBC
HCT: 25.5 % — ABNORMAL LOW (ref 39.0–52.0)
Hemoglobin: 8.1 g/dL — ABNORMAL LOW (ref 13.0–17.0)
MCH: 28 pg (ref 26.0–34.0)
MCHC: 31.8 g/dL (ref 30.0–36.0)
MCV: 88.2 fL (ref 80.0–100.0)
Platelets: 150 10*3/uL (ref 150–400)
RBC: 2.89 MIL/uL — ABNORMAL LOW (ref 4.22–5.81)
RDW: 15.8 % — ABNORMAL HIGH (ref 11.5–15.5)
WBC: 8.1 10*3/uL (ref 4.0–10.5)
nRBC: 0.2 % (ref 0.0–0.2)

## 2023-03-11 LAB — BASIC METABOLIC PANEL
Anion gap: 7 (ref 5–15)
BUN: 31 mg/dL — ABNORMAL HIGH (ref 6–20)
CO2: 20 mmol/L — ABNORMAL LOW (ref 22–32)
Calcium: 8.5 mg/dL — ABNORMAL LOW (ref 8.9–10.3)
Chloride: 105 mmol/L (ref 98–111)
Creatinine, Ser: 1.34 mg/dL — ABNORMAL HIGH (ref 0.61–1.24)
GFR, Estimated: >60 mL/min (ref >60)
Glucose, Bld: 134 mg/dL — ABNORMAL HIGH (ref 70–99)
Potassium: 4.3 mmol/L (ref 3.5–5.1)
Sodium: 132 mmol/L — ABNORMAL LOW (ref 135–145)

## 2023-03-11 LAB — VANCOMYCIN, RANDOM: Vancomycin Rm: 16 ug/mL

## 2023-03-11 LAB — GLUCOSE, CAPILLARY
Glucose-Capillary: 122 mg/dL — ABNORMAL HIGH (ref 70–99)
Glucose-Capillary: 144 mg/dL — ABNORMAL HIGH (ref 70–99)
Glucose-Capillary: 142 mg/dL — ABNORMAL HIGH (ref 70–99)
Glucose-Capillary: 132 mg/dL — ABNORMAL HIGH (ref 70–99)

## 2023-03-11 LAB — AEROBIC/ANAEROBIC CULTURE W GRAM STAIN (SURGICAL/DEEP WOUND)

## 2023-03-11 MED ORDER — VANCOMYCIN HCL 1750 MG/350ML IV SOLN
1750.0000 mg | INTRAVENOUS | Status: DC
  Administered 2023-03-11 – 2023-03-12 (×2): 1750 mg via INTRAVENOUS
  Filled 2023-03-11 (×2): qty 350

## 2023-03-11 NOTE — Progress Notes (Addendum)
  Progress Note    03/11/2023 8:40 AM 2 Days Post-Op  Subjective:  no complaints   Vitals:   03/10/23 2346 03/11/23 0320  BP: 114/75 (!) 95/58  Pulse:    Resp: 18 18  Temp: 97.8 F (36.6 C) 98.7 F (37.1 C)  SpO2: 96% 97%   Physical Exam: Lungs:  non labored Incisions:  L groin and popliteal c/d/i Extremities:  L DP brisk by doppler Neurologic: A&O  CBC    Component Value Date/Time   WBC 8.1 03/11/2023 0133   RBC 2.89 (L) 03/11/2023 0133   HGB 8.1 (L) 03/11/2023 0133   HCT 25.5 (L) 03/11/2023 0133   PLT 150 03/11/2023 0133   MCV 88.2 03/11/2023 0133   MCH 28.0 03/11/2023 0133   MCHC 31.8 03/11/2023 0133   RDW 15.8 (H) 03/11/2023 0133   LYMPHSABS 1.1 02/27/2023 0858   MONOABS 0.6 02/27/2023 0858   EOSABS 0.1 02/27/2023 0858   BASOSABS 0.0 02/27/2023 0858    BMET    Component Value Date/Time   NA 132 (L) 03/11/2023 0133   K 4.3 03/11/2023 0133   CL 105 03/11/2023 0133   CO2 20 (L) 03/11/2023 0133   GLUCOSE 134 (H) 03/11/2023 0133   BUN 31 (H) 03/11/2023 0133   CREATININE 1.34 (H) 03/11/2023 0133   CALCIUM 8.5 (L) 03/11/2023 0133   GFRNONAA >60 03/11/2023 0133   GFRAA 54 (L) 10/19/2019 0616    INR    Component Value Date/Time   INR 1.2 12/12/2021 0015     Intake/Output Summary (Last 24 hours) at 03/11/2023 0840 Last data filed at 03/11/2023 0500 Gross per 24 hour  Intake 560 ml  Output 1150 ml  Net -590 ml     Assessment/Plan:  57 y.o. male is s/p  left common femoral endarterectomy with profundoplasty and bovine pericardial patch and a redo left fem-posterior tibial bypass with PTFE   2 Days Post-Op   Left foot warm with brisk DP signal by Doppler.  Continue aspirin, Xarelto, statin daily.  Continue antibiotics.  Out of bed with therapy team.  Okay for discharge from vascular surgery standpoint.  Office will arrange follow-up in 2 to 3 weeks.  Patient is aware he remains at high risk for amputation of the leg.   Emilie Rutter,  PA-C [facility] [phone] 03/11/2023 8:40 AM  I have independently interviewed and examined patient and agree with PA assessment and plan above.   Cerria Randhawa C. Randie Heinz, MD [facility] [place] Office: [phone] Pager: [phone]

## 2023-03-11 NOTE — Plan of Care (Signed)
  Problem: Pain Managment: Goal: General experience of comfort will improve Outcome: Progressing   Problem: Safety: Goal: Ability to remain free from injury will improve Outcome: Progressing   Problem: Skin Integrity: Goal: Risk for impaired skin integrity will decrease Outcome: Progressing   

## 2023-03-11 NOTE — Progress Notes (Signed)
Subjective:  No significant overnight events. Patient in good spirits today. He was able to ambulate to the bathroom and then bedside recliner with assistance. He reports his pain is well-controlled this morning, though he did have some neuropathic pain overnight. He denies chest pain and shortness of breath. He has had multiple BMs over the past day.  Objective:  Vital signs in last 24 hours: Vitals:   03/10/23 1936 03/10/23 2346 03/11/23 0320 03/11/23 0926  BP: (!) 122/92 114/75 (!) 95/58 108/73  Pulse: 96   93  Resp: 20 18 18 17   Temp: 97.9 F (36.6 C) 97.8 F (36.6 C) 98.7 F (37.1 C) 98.6 F (37 C)  TempSrc: Oral Oral Oral Oral  SpO2: 98% 96% 97% 99%  Weight:      Height:       Intake/Output Summary (Last 24 hours) at 03/11/2023 1016 Last data filed at 03/11/2023 0500 Gross per 24 hour  Intake 460 ml  Output 1150 ml  Net -690 ml   Physical Exam: Constitutional: Middle-aged male sitting in bedside recliner watching TV. Appears comfortable and in no acute distress. Cardiovascular: Regular rate, regular rhythm. 2/6 systolic murmur best appreciated at the left upper sternal border. LLE swelling > RLE, but no erythema or tenderness. Pulmonary: Normal respiratory effort. No wheezes, rales, rhonchi, or crackles.   Abdominal: Soft. Non-distended. Normal bowel sounds.    Neurological: Alert and oriented to person, place, and time. Skin: Warm and dry. PRAFO on LLE with wound vac in place. Left second toe amputation with serosanguinous drainage.   Assessment/Plan:  Principal Problem:   Critical lower limb ischemia (HCC) Active Problems:   Type 2 diabetes mellitus with circulatory disorder (HCC)   PVD (peripheral vascular disease) (HCC)   Stage 3a chronic kidney disease (CKD) (HCC)   Open wound of left foot   Gangrene of left foot (HCC)   Osteomyelitis of foot, left, acute (HCC)  [name]. Is a 57 y.o. male living with PAD, HTN, T2DM, CKD stage 3a, and HFrEF who  presented with foot pain and left heel wound and admitted for critical ischemia of left leg and management of left heel wound on HD #12.  # Left heel wound S/p partial L second toe amputation and debridement of L heel wound on 5/6. S/p partial calcaneus excision and excision of necrotic Achilles tendon on 5/10. Patient reports his pain is well-controlled this morning. He is learning to ambulate with PRAFO and NWB on left leg. 250 cc in wound vac canister. Will continue wound vac at discharge. Calcaneal tissue growing gram negative rods. Antibiotic sensitivities pending. Will continue antibiotics (cefepime, vancomycin, flagyl) per pharmacy with plans to de-escalate before discharge. -continue cefepime, vancomycin, and flagyl -f/u calcaneal culture to de-escalate abx  # PAD S/p angioplasty with stent placement of left common iliac artery on 5/1. S/p L common femoral endarterectomy with redo femoral-tibial bypass on 5/6. Will continue medical management. -appreciate vascular recommendations -aspirin 81 mg -atorvastatin 80 mg -Xarelto 2.5 mg BID  # Non-ischemic cardiomyopathy # Pulmonary hypertension # HFrEF, EF 20-25% # CAD Renal function improving with creatinine 1.34 today. Will continue lasix, farxiga, metoprolol succinate, and entresto. Will hold spironolactone given blood pressure low-normal.  -continue lasix 40 mg, farxiga 10 mg, and metoprolol succinate 50 mg daily -continue entresto 24-26 mg BID -hold spironolactone 12.5 mg -strict I and Os -Daily weights -trend BMP  # Type 2 Diabetes Blood glucose well-controlled. Patient reports bilateral neuropathic pain overnight but this resolved by  morning. -gabapentin 500 mg TID -farxiga 10 mg qd -SSI  # AKI on CKD stage 3a Renal function improving with creatinine 1.34 today from baseline of 1.0-1.1. Will continue to add back GDMT as blood pressure and renal function tolerate. -trend BMP   Diet: HH/CM Bowel: Senna BID VTE: Xarelto,  aspirin IVF: None Code: Full PT/OT recs: HH PT/OT LOS: day 12   Prior to Admission Living Arrangement: home with self Anticipated Discharge Location: home with [facility] PT/PT Barriers to Discharge: calcaneal culture results  Whitman Hero, Medical Student 03/11/2023, 10:16 AM

## 2023-03-11 NOTE — Progress Notes (Signed)
Pharmacy Antibiotic Note  Edward Rocha. is a 57 y.o. male admitted on 02/27/2023 with wound infection s/p  left common femoral endarterectomy with profundoplasty and redo fem-tib bypass. VVS concerned for infection and cultures collected intra-op on 5/6, and cultures also obtained 5/10 with partial excision of left calcaneous. Pharmacy has been consulted for Vancomycin and Cefepime dosing. Also on Flagyl per MD.   Vancomycin levels were drawn on 5/10 at steady state - AUC resulted supratherapeutic at 892 on current dose 1g IV q12h. Vancomycin random level obtained 5/12 AM at 01:33 was 16. Scr improving at 1.34  close to baseline Scr of ~1.2 and UOP improving, ~0.3 mg/kg/hr on 5/10 to ~1 mg/kg/hr on 5/11. WBC 8.1 wnl, pt afebrile.   Plan: Resume vancomycin 1750 mg IV q24h (eAUC 476.8, Scr 1.34, using IBW, Vd 0.72), goal AUC 400-550  Continue Cefepime 2g IV q8h Flagyl 500mg  IV q12h per MD  Monitor clinical progress, c/s, closely watch renal function, obtain vancomycin levels at new steady state as indicated F/u antibiotic plan/LOT   Height: 6\' 1"  (185.4 cm) Weight: 83.9 kg (185 lb) IBW/kg (Calculated) : 79.9  Temp (24hrs), Avg:98.2 F (36.8 C), Min:97.8 F (36.6 C), Max:98.7 F (37.1 C)  Recent Labs  Lab 03/07/23 0123 03/08/23 0204 03/09/23 0122 03/09/23 1257 03/09/23 1829 03/10/23 0111 03/11/23 0133  WBC 14.5* 11.2* 8.6  --   --  9.1 8.1  CREATININE 1.50* 1.48* 1.43*  --   --  1.37* 1.34*  VANCOTROUGH  --   --   --   --  36*  --   --   VANCORANDOM  --   --   --  37  --   --  16     Estimated Creatinine Clearance: 67.9 mL/min (A) (by C-G formula based on SCr of 1.34 mg/dL (H)).    Allergies  Allergen Reactions   Other Other (See Comments)    Pt received platelets and had a bad reaction from infusion     Antimicrobials this admission: 5/6 cefepime >>  5/6 Vancomycin >> 5/6 Flagyl  >>   Microbiology results: 5/6 Tissue Cx: enterobacter cloacae (pan-s) 5/10 Tissue, L  heel: few GPC, GNR, GPR, squamous epithelial cells  on gram stain, moderate GNR reincubating 5/10 L heel bone Cx: rare GNR pending   Larena Sox, PharmD PGY1 Pharmacy Resident   03/11/2023  8:40 AM

## 2023-03-12 ENCOUNTER — Other Ambulatory Visit (HOSPITAL_COMMUNITY): Payer: Self-pay

## 2023-03-12 ENCOUNTER — Inpatient Hospital Stay (HOSPITAL_COMMUNITY): Payer: Medicare Other

## 2023-03-12 DIAGNOSIS — M7989 Other specified soft tissue disorders: Secondary | ICD-10-CM

## 2023-03-12 LAB — BASIC METABOLIC PANEL
Anion gap: 5 (ref 5–15)
BUN: 29 mg/dL — ABNORMAL HIGH (ref 6–20)
CO2: 18 mmol/L — ABNORMAL LOW (ref 22–32)
Calcium: 8.3 mg/dL — ABNORMAL LOW (ref 8.9–10.3)
Chloride: 108 mmol/L (ref 98–111)
Creatinine, Ser: 1.19 mg/dL (ref 0.61–1.24)
GFR, Estimated: >60 mL/min (ref >60)
Glucose, Bld: 117 mg/dL — ABNORMAL HIGH (ref 70–99)
Potassium: 4.2 mmol/L (ref 3.5–5.1)
Sodium: 131 mmol/L — ABNORMAL LOW (ref 135–145)

## 2023-03-12 LAB — GLUCOSE, CAPILLARY
Glucose-Capillary: 111 mg/dL — ABNORMAL HIGH (ref 70–99)
Glucose-Capillary: 145 mg/dL — ABNORMAL HIGH (ref 70–99)

## 2023-03-12 LAB — CBC
HCT: 29.3 % — ABNORMAL LOW (ref 39.0–52.0)
Hemoglobin: 9.1 g/dL — ABNORMAL LOW (ref 13.0–17.0)
MCH: 27.5 pg (ref 26.0–34.0)
MCHC: 31.1 g/dL (ref 30.0–36.0)
MCV: 88.5 fL (ref 80.0–100.0)
Platelets: 151 10*3/uL (ref 150–400)
RBC: 3.31 MIL/uL — ABNORMAL LOW (ref 4.22–5.81)
RDW: 15.9 % — ABNORMAL HIGH (ref 11.5–15.5)
WBC: 7.9 10*3/uL (ref 4.0–10.5)
nRBC: 0.3 % — ABNORMAL HIGH (ref 0.0–0.2)

## 2023-03-12 LAB — AEROBIC/ANAEROBIC CULTURE W GRAM STAIN (SURGICAL/DEEP WOUND): Gram Stain: NONE SEEN

## 2023-03-12 MED ORDER — RIVAROXABAN 2.5 MG PO TABS
2.5000 mg | ORAL_TABLET | Freq: Two times a day (BID) | ORAL | 0 refills | Status: AC
  Filled 2023-03-12: qty 60, 30d supply, fill #0

## 2023-03-12 MED ORDER — ATORVASTATIN CALCIUM 80 MG PO TABS
80.0000 mg | ORAL_TABLET | Freq: Every day | ORAL | 0 refills | Status: DC
  Filled 2023-03-12: qty 30, 30d supply, fill #0

## 2023-03-12 MED ORDER — LEVOFLOXACIN 750 MG PO TABS
750.0000 mg | ORAL_TABLET | Freq: Every day | ORAL | 0 refills | Status: DC
  Filled 2023-03-12: qty 30, 30d supply, fill #0

## 2023-03-12 MED ORDER — GABAPENTIN 400 MG PO CAPS
400.0000 mg | ORAL_CAPSULE | Freq: Three times a day (TID) | ORAL | 0 refills | Status: AC
  Filled 2023-03-12: qty 90, 30d supply, fill #0

## 2023-03-12 MED ORDER — OXYCODONE HCL 5 MG PO TABS
5.0000 mg | ORAL_TABLET | Freq: Four times a day (QID) | ORAL | 0 refills | Status: AC | PRN
  Filled 2023-03-12: qty 12, 3d supply, fill #0

## 2023-03-12 MED ORDER — AMOXICILLIN-POT CLAVULANATE 875-125 MG PO TABS
1.0000 | ORAL_TABLET | Freq: Two times a day (BID) | ORAL | 0 refills | Status: DC
  Filled 2023-03-12: qty 60, 30d supply, fill #0

## 2023-03-12 MED ORDER — SPIRONOLACTONE 12.5 MG HALF TABLET
12.5000 mg | ORAL_TABLET | Freq: Every day | ORAL | Status: DC
  Administered 2023-03-12: 12.5 mg via ORAL
  Filled 2023-03-12: qty 1

## 2023-03-12 NOTE — Progress Notes (Signed)
Patient ID: Edward Rocha., male   DOB: 12/19/1965, 57 y.o.   MRN: [id number] Patient is status post Achilles and calcaneal debridement.  There is 300 cc in the wound VAC canister.  Will plan for transition to the Praveena plus portable wound VAC pump for discharge.

## 2023-03-12 NOTE — Progress Notes (Signed)
[facility] [place]  Subjective  -no complaints   Objective (!) 160/72 80 98.4 F (36.9 C) (Oral) 18 100%  Intake/Output Summary (Last 24 hours) at 03/12/2023 0810 Last data filed at 03/12/2023 0432 Gross per 24 hour  Intake 2050 ml  Output 2025 ml  Net 25 ml    Left groin and left below-knee leg incision clean dry and intact Left second toe amputation healing Brisk left DP signal  Laboratory Lab Results: Recent Labs    03/11/23 0133 03/12/23 0157  WBC 8.1 7.9  HGB 8.1* 9.1*  HCT 25.5* 29.3*  PLT 150 151   BMET Recent Labs    03/11/23 0133 03/12/23 0157  NA 132* 131*  K 4.3 4.2  CL 105 108  CO2 20* 18*  GLUCOSE 134* 117*  BUN 31* 29*  CREATININE 1.34* 1.19  CALCIUM 8.5* 8.3*    COAG Lab Results  Component Value Date   INR 1.2 12/12/2021   INR 1.3 (H) 12/07/2021   INR 1.7 (H) 08/27/2019   No results found for: "PTT"  Assessment/Planning:  Now status post multilevel revascularization with left common iliac stent as well as left common femoral endarterectomy and a redo left femoral to tibial bypass.  Still has a brisk dorsalis pedis signal.  Left 2nd toe amputation is healing.  Aspirin statin Plavix at discharge.  Will arrange follow-up in 2 to 3 weeks for incision check.  Cephus Shelling 03/12/2023 8:10 AM --

## 2023-03-12 NOTE — Plan of Care (Signed)
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 to be discharged home per MD order. Discussed prescriptions and follow up appointments with the patient. Prescriptions given to patient; medication list explained in detail. Patient verbalized understanding.  IV catheter discontinued intact. Site without signs and symptoms of complications. Dressing and pressure applied. Pt denies pain at the site currently. No complaints noted.  An After Visit Summary (AVS) was printed and given to the patient. Patient escorted via wheelchair, and discharged home via private auto.  Arlice Colt, RN

## 2023-03-12 NOTE — Discharge Instructions (Addendum)
 [facility]  Discharge instructions  Lower Extremity Bypass Surgery  Please refer to the following instruction for your post-procedure care. Your surgeon or physician assistant will discuss any changes with you.  Activity  You are encouraged to walk as much as you can. You can slowly return to normal activities during the month after your surgery. Avoid strenuous activity and heavy lifting until your doctor tells you it's OK. Avoid activities such as vacuuming or swinging a golf club. Do not drive until your doctor give the OK and you are no longer taking prescription pain medications. It is also normal to have difficulty with sleep habits, eating and bowel movement after surgery. These will go away with time.  Bathing/Showering  You may shower after you go home. Do not soak in a bathtub, hot tub, or swim until the incision heals completely.  Incision Care  Clean your incision with mild soap and water. Shower every day. Pat the area dry with a clean towel. You do not need a bandage unless otherwise instructed. Do not apply any ointments or creams to your incision. If you have open wounds you will be instructed how to care for them or a visiting nurse may be arranged for you. If you have staples or sutures along your incision they will be removed at your post-op appointment. You may have skin glue on your incision. Do not peel it off. It will come off on its own in about one week. If you have a great deal of moisture in your groin, use a gauze help keep this area dry.  Diet  Resume your normal diet. There are no special food restrictions following this procedure. A low fat/ low cholesterol diet is recommended for all patients with vascular disease. In order to heal from your surgery, it is CRITICAL to get adequate nutrition. Your body requires vitamins, minerals, and protein. Vegetables are the best source of vitamins and minerals. Vegetables also provide the  perfect balance of protein. Processed food has little nutritional value, so try to avoid this.  Medications  Resume taking all your medications unless your doctor or nurse practitioner tells you not to. If your incision is causing pain, you may take over-the-counter pain relievers such as acetaminophen (Tylenol). If you were prescribed a stronger pain medication, please aware these medication can cause nausea and constipation. Prevent nausea by taking the medication with a snack or meal. Avoid constipation by drinking plenty of fluids and eating foods with high amount of fiber, such as fruits, vegetables, and grains. Take Colase 100 mg (an over-the-counter stool softener) twice a day as needed for constipation. Do not take Tylenol if you are taking prescription pain medications.  Follow Up  Our office will schedule a follow up appointment 2-3 weeks following discharge.  Please call us immediately for any of the following conditions  Severe or worsening pain in your legs or feet while at rest or while walking Increase pain, redness, warmth, or drainage (pus) from your incision site(s) Fever of 101 degree or higher The swelling in your leg with the bypass suddenly worsens and becomes more painful than when you were in the hospital If you have been instructed to feel your graft pulse then you should do so every day. If you can no longer feel this pulse, call the office immediately. Not all patients are given this instruction.  Leg swelling is common after leg bypass surgery.  The swelling should improve over a few months   following surgery. To improve the swelling, you may elevate your legs above the level of your heart while you are sitting or resting. Your surgeon or physician assistant may ask you to apply an ACE wrap or wear compression (TED) stockings to help to reduce swelling.  Reduce your risk of vascular disease  Stop smoking. If you would like help call [facility] at 1-800-QUIT-NOW  (412 441 6517) or [place] at [phone].  Manage your cholesterol Maintain a desired weight Control your diabetes weight Control your diabetes Keep your blood pressure down  If you have any questions, please call the office at [phone]    You were hospitalized for peripheral artery disease of the left leg causing poor blood flow and wounds to your foot. You ahd your second toe amputated and had surgeries to improve the blood flow to your leg. To help with blood flow you were started on a blood thinner called xarelto and your lipid lowering medicine atorvastatin was increased. You were also found to have infection of the left heel skin and bone. You were treated with IV antibiotics during hospitalization. You will be discharged with oral antibiotics to complete a total of 6 weeks. You will follow up with Dr. Ninetta Lights in clinic to reevaluate your foot.   CHANGES TO MEDICATIONS:  Start xarelto 2.5mg  1 tablet twice daily Increase Atorvastatin to 80mg  1 tablet daily Increase Gabapentin to 400mg  1 tablet three times daily Start Augmentin 875-125mg  1 tablet twice daily until June 17th 2024 Start Levofloxacin 750mg  1 tablet daily until June 17th 2024 It was a pleasure taking care of you!

## 2023-03-12 NOTE — Discharge Summary (Signed)
Name: Edward Rocha. MRN: [id number] DOB: [birth year] 57 y.o. PCP: Shelby Dubin, FNP  Date of Admission: 02/27/2023  8:48 AM Date of Discharge: 03/12/2023 Attending Physician: Ginnie Smart, MD  Discharge Diagnosis: 1. Principal Problem:   Critical lower limb ischemia (HCC) Active Problems:   Type 2 diabetes mellitus with circulatory disorder (HCC)   PVD (peripheral vascular disease) (HCC)   Stage 3a chronic kidney disease (CKD) (HCC)   Open wound of left foot   Gangrene of left foot (HCC)   Osteomyelitis of foot, left, acute [facility])    Discharge Medications: Allergies as of 03/12/2023       Reactions   Other Other (See Comments)   Pt received platelets and had a bad reaction from infusion         Medication List     STOP taking these medications    ergocalciferol 1.25 MG (50000 UT) capsule Commonly known as: VITAMIN D2       TAKE these medications    acetaminophen 500 MG tablet Commonly known as: TYLENOL Take 1 tablet (500 mg total) by mouth daily as needed for moderate pain or headache. What changed: how much to take   amoxicillin-clavulanate 875-125 MG tablet Commonly known as: AUGMENTIN Take 1 tablet by mouth 2 (two) times daily.   aspirin EC 81 MG tablet Take 81 mg by mouth daily.   atorvastatin 80 MG tablet Commonly known as: LIPITOR Take 1 tablet (80 mg total) by mouth daily. Start taking on: Mar 13, 2023 What changed:  medication strength how much to take   dapagliflozin propanediol 10 MG Tabs tablet Commonly known as: FARXIGA Take 1 tablet (10 mg total) by mouth daily. What changed: when to take this   Entresto 24-26 MG Generic drug: sacubitril-valsartan Take 1 tablet by mouth 2 (two) times daily.   furosemide 40 MG tablet Commonly known as: LASIX Take 1 tablet (40 mg total) by mouth daily. What changed: when to take this   gabapentin 400 MG capsule Commonly known as: Neurontin Take 1 capsule (400 mg total) by mouth 3  (three) times daily. What changed:  medication strength how much to take when to take this additional instructions   Knee Support/Elastic/Firm Med Misc 1 each by Does not apply route as directed. Medium pressure compression stockings Dx: leg edema   levofloxacin 750 MG tablet Commonly known as: Levaquin Take 1 tablet (750 mg total) by mouth daily.   metoprolol succinate 50 MG 24 hr tablet Commonly known as: Toprol XL Take 1 tablet (50 mg total) by mouth daily. What changed: when to take this   nitroGLYCERIN 0.4 MG SL tablet Commonly known as: NITROSTAT Place 0.4 mg under the tongue every 5 (five) minutes as needed for chest pain.   oxyCODONE 5 MG immediate release tablet Commonly known as: Oxy IR/ROXICODONE Take 1 tablet (5 mg total) by mouth every 6 (six) hours as needed for up to 5 days for severe pain.   rivaroxaban 2.5 MG Tabs tablet Commonly known as: XARELTO Take 1 tablet (2.5 mg total) by mouth 2 (two) times daily.   spironolactone 25 MG tablet Commonly known as: ALDACTONE Take 0.5 tablets (12.5 mg total) by mouth daily. What changed:  how much to take when to take this               Discharge Care Instructions  (From admission, onward)           Start     Ordered  03/12/23 0000  Discharge wound care:       Comments: Per nursing   03/12/23 1506            Disposition and follow-up:   [name] Son. was discharged from [facility] in Good condition.  At the hospital follow up visit please address:  1.  # PAD s/p L femoral endarterectomy and redo fem-tib bypass # Left heel wound and calcaneal osteomyelitis -1 week f/u Dr. Lajoyce Corners -2 week f/u [facility]VS -DISCHARGED WITH 30 DAYS SUPPLE OF LEVOQUIN AND AUGMENTIN, ENSURE REFILLS TO COMPLETE 6 WEEK COURSE (START DATE 03/05/23)  #L leg swelling  -follow up DVT ultrasound study  # VHQ4O -repeat BMP  # Hypothyroidism -Most recent TSH elevated at 6.178, 1 year ago.Repeat  TSH   2.  Labs / imaging needed at time of follow-up: BMP,TSH  3.  Pending labs/ test needing follow-up: DVT ultrasound left lower extremity  Follow-up Appointments:  Follow-up Information     [name], MD Follow up in 1 week(s).   Specialty: Orthopedic Surgery Contact information: [address] [place] [address] [phone]         [facility] Follow up in 2 week(s).   Specialty: Vascular Surgery Contact information: [address] [place] [address] [phone]               03/28/2023 1:45 PM [place] Lights Lacretia Leigh, MD [facility]    Follow up with your primary care provider within 1-2 weeks of hospital discharge.  Hospital Course by problem list: 1.  Edward Rocha. Is a 57 y.o. male living with PAD, HTN, T2DM, CKD stage 3a, CHF, hypothyroidism, and cirrhosis who presents to the ED with foot pain and left heel wound and admitted for peripheral artery disease.   # PAD s/p L femoral endarterectomy and redo fem-tib bypass # Left heel wound and calcaneal osteomyelitis Patient presented to ED with 59-month history of left foot pain and left heel wound. On exam, patient had gangrenous wound over his left Achilles and gangrene of the left 2nd toe. Lower extremity pulses were diminished bilaterally. Doppler revealed dampened monophasic dorsalis pedis and posterior tibial signal on the left and fairly brisk posterior tibial signal with a dampened monophasic anterior tibial signal on the right. Patient underwent angioplasty with stent placement of left common iliac artery on 5/1. Due to multilevel disease, he underwent left common femoral endarterectomy, redo femoral-tibial bypass, partial left second toe amputation, and debridement of left heel wound on 5/6. Vascular surgery concerned for wound infection intra-operatively, so cefepime, vancomycin, and Flagyl were started on 5/6. MRI of  left foot showed subcortical marrow edema in the posterior calcaneus favored to be reactive marrow change vs. early osteomyelitis. Due to concern for calcaneal osteomyelitis, patient underwent partial excision of left calcaneus and excision of left heel skin/muscle fascia/Achilles tendon on 5/10. Calcaneal bone cultures grew Proteus penneri and Enterococcus faecalis. Patient started on levofloxacin 750 mg daily and Augmentin 875 mg BID based on culture sensitivities on 5/13. Cefepime, vancomycin, and Flagyl discontinued on 5/13. Will complete antibiotic course of 5 weeks at discharge. Patient will also be discharged with wound vac and PRAFO until follow-up with orthopedics. Patient also received medical therapy with Xarelto 2.5 mg BID for PAD, atorvastatin 80 mg, and aspirin 81 mg during hospitalization and will continue at discharge. VVS recommended ASA + plavix but per pharmacy only xarelto and ASA needed.Patient  presented with L lower leg swelling, which worsened by admission despite multiple days post op. Given that he went a few days off of VTE prophylaxis LE DVT studies were performed. These will be followed up after admission. Patient will need to follow-up outpatient with orthopedics, vascular surgery, infectious disease, and PCP. Discharged home with [facility] PT/OT.  # Non-ischemic cardiomyopathy # Pulmonary hypertension # HFrEF, EF 20-25% # CAD In the ED, EKG showed sinus tachycardia with biatrial enlargement and LVH. CXR with mild pulmonary edema. No signs of volume overload appreciated on exam. Home metoprolol succinate 50 mg and Farxiga 10 mg daily were continued throughout hospitalization. Due to fluctuations in renal function, lasix 40 mg, entresto 24-26 mg BID, and spironolactone 12.5 mg were held intermittently. All home medications were resumed by the day of discharge with patient tolerating well. Will continue metoprolol succinate 50 mg, Farxiga 10 mg, lasix 40 mg, entresto 24-26 mg BID, and  spironolactone 12.5 mg at discharge.   # [id number] Patient with baseline creatinine of 1-1.2. Renal function fluctuated throughout hospitalization likely secondary to contrast-induced nephropathy from angiogram on 5/1 and cardiorenal syndrome. GDMT was held intermittently to allow recovery of renal function. Creatinine returned to baseline at 1.19 on day of discharge with all home GDMT resumed. Recommend repeat BMP outpatient.   # HTN BP stable throughout admission. BP WNL on day of discharge with all home GDMT resumed. Will continue home medications at discharge.  # T2DM A1c was 6.5% on admission. Blood glucose well-controlled throughout hospitalization with Farxiga 10 mg and SSI. Patient initially reported severe neuropathic pain, so increased from home gabapentin 300 mg TID PRN to gabapentin 500 mg TID. Neuropathic pain well-controlled on day of discharge. Will continue Farxiga 10 mg and gabapentin 400 mg TID at discharge.   # Cirrhosis # Treated Hepatitis C On admission, hepatomegaly was noted on physical exam. CMP with total bilirubin of 1.4 but normal AST/ALT/Alkaline phosphate. Recommend outpatient GI follow-up.  # Hypothyroidism Most recent TSH elevated at 6.178, 1 year ago. He was previously taking levothyroxine but not currently. Recommend repeat TSH/T4 as outpatient.  Subjective: No significant overnight events. Patient was able to ambulate to the bedside recliner with assistance. He confirms NWB when ambulating. His pain is well-controlled this morning, but he did have some left foot pain overnight. He denies chest pain or shortness of breath.  Discharge Exam:   BP 101/63 (BP Location: Left Arm)   Pulse 80   Temp 98.5 F (36.9 C) (Oral)   Resp 18   Ht 6\' 1"  (1.854 m)   Wt 83.9 kg   SpO2 100%   BMI 24.41 kg/m  Discharge exam:  Constitutional: Middle-aged male sitting in bedside recliner watching TV. Appears comfortable and in no acute distress. Cardiovascular: Regular rate,  regular rhythm. 2/6 systolic murmur best appreciated at the left upper sternal border. LLE swelling with +2 pitting edema. RLE with +1 pitting edema. No erythema or calf tenderness. Pulmonary: Normal respiratory effort. No wheezes, rales, rhonchi, or crackles.   Abdominal: Soft. Non-distended. Normal bowel sounds.    Neurological: Alert and oriented to person, place, and time. Skin: Warm and dry. PRAFO on LLE with wound vac in place. Left second toe amputation with serosanguinous drainage and surrounding skin maceration on sole of foot.  Pertinent Labs, Studies, and Procedures:     Latest Ref Rng & Units 03/12/2023    1:57 AM 03/11/2023    1:33 AM 03/10/2023    1:11 AM  CBC  WBC  4.0 - 10.5 K/uL 7.9  8.1  9.1   Hemoglobin 13.0 - 17.0 g/dL 9.1  8.1  7.9   Hematocrit 39.0 - 52.0 % 29.3  25.5  24.8   Platelets 150 - 400 K/uL 151  150  145        Latest Ref Rng & Units 03/12/2023    1:57 AM 03/11/2023    1:33 AM 03/10/2023    1:11 AM  BMP  Glucose 70 - 99 mg/dL 578  469  629   BUN 6 - 20 mg/dL 29  31  30    Creatinine 0.61 - 1.24 mg/dL 5.28  4.13  2.44   Sodium 135 - 145 mmol/L 131  132  133   Potassium 3.5 - 5.1 mmol/L 4.2  4.3  4.3   Chloride 98 - 111 mmol/L 108  105  105   CO2 22 - 32 mmol/L 18  20  20    Calcium 8.9 - 10.3 mg/dL 8.3  8.5  8.5    MR FOOT LEFT WO CONTRAST  Result Date: 03/01/2023 CLINICAL DATA:  Soft tissue infection suspected, foot, xray done EXAM: MRI OF THE LEFT FOOT WITHOUT CONTRAST TECHNIQUE: Multiplanar, multisequence MR imaging of the hindfoot was performed. No intravenous contrast was administered. COMPARISON:  No prior MRI for direct comparison. FINDINGS: Field of view is limited to the hindfoot. Bones/Joint/Cartilage There is subcortical marrow edema in the posterior calcaneus with preserved T1 signal. There are prominent vascular channels or bone infarcts in the calcaneus. There is mild tibiotalar and posterior subtalar osteoarthritis. There is patchy marrow edema  signal in the talus and mid tarsal bones which is likely reactive. There is no evidence of acute fracture. Ligaments Intact lateral ankle ligaments. Intact high ankle ligaments. Chronic deep deltoid and spring ligament sprain. Muscles and Tendons There is no acute tendon tear or significant tenosynovitis. Intramuscular edema and atrophy compatible with chronic denervation change. Partial erosion of the Achilles tendon without full-thickness rupture or intertendinous abscess. Soft tissues There is generalized soft tissue swelling of the hindfoot and partially visualized midfoot. No evidence of an organized/drainable fluid collection. There is a soft tissue ulcer extending to the surface of the Achilles tendon with partial erosion into the Achilles tendon. There is Achilles tendinosis, mild peritendinitis, and trace retrocalcaneal bursitis. IMPRESSION: Soft tissue ulcer extending to the surface of the Achilles tendon with partial erosion into the Achilles tendon. There is mild Achilles tendinosis, peritendinitis, and trace retrocalcaneal bursitis. No intratendinous abscess or full-thickness rupture. Subcortical marrow edema in the posterior calcaneus with preserved T1 signal, favored to be reactive marrow change, though early osteomyelitis could have a similar appearance. Patchy marrow edema signal in the talus and mid tarsal bones, likely reactive. Generalized soft tissue swelling of the hindfoot and partially visualized midfoot. No evidence of soft tissue abscess. Note that the field of view is limited to the hindfoot/ankle. Electronically Signed   By: Caprice Renshaw M.D.   On: 03/01/2023 08:31   VAS [place] LOWER EXTREMITY SAPHENOUS VEIN MAPPING  Result Date: 02/28/2023 LOWER EXTREMITY VEIN MAPPING Patient Name:  Brenley Martinsen.  Date of Exam:   02/28/2023 Medical Rec #: [id number]         Accession #:    [id number] Date of Birth: October 01, 1966         Patient Gender: M Patient Age:   59 years Exam Location:  [facility] Procedure:      VAS [place] LOWER EXTREMITY SAPHENOUS VEIN MAPPING Referring  Phys: Sherald Hess --------------------------------------------------------------------------------  Indications: PAD, Redo left bypass graft  Comparison Study: No previous study. Performing Technologist: McKayla Maag RVT, [place]  Examination Guidelines: A complete evaluation includes B-mode imaging, spectral Doppler, color Doppler, and power Doppler as needed of all accessible portions of each vessel. Bilateral testing is considered an integral part of a complete examination. Limited examinations for reoccurring indications may be performed as noted. +------------+-------------+---------------------+-----------+-----------------+ RT Diameter  RT Findings          GSV         LT Diameter   LT Findings        (cm)                                         (cm)                      +------------+-------------+---------------------+-----------+-----------------+                Bandage      Saphenofemoral       0.27                                                     Junction                                    +------------+-------------+---------------------+-----------+-----------------+     0.24                    Proximal thigh       0.22                      +------------+-------------+---------------------+-----------+-----------------+     0.16                       Mid thigh                 branching and not                                                             visualized     +------------+-------------+---------------------+-----------+-----------------+     0.18                     Distal thigh                 not visualized   +------------+-------------+---------------------+-----------+-----------------+     0.17                         Knee                     not visualized   +------------+-------------+---------------------+-----------+-----------------+                   not           Prox calf  not visualized                visualized                                                    +------------+-------------+---------------------+-----------+-----------------+                  not           Mid calf                   not visualized                visualized                                                    +------------+-------------+---------------------+-----------+-----------------+                  not          Distal calf                 not visualized                visualized                                                    +------------+-------------+---------------------+-----------+-----------------+                  not             Ankle                    not visualized                visualized                                                    +------------+-------------+---------------------+-----------+-----------------+ +----------------+-----------+---------------+----------------+-----------+ RT diameter (cm)RT Findings      SSV      LT Diameter (cm)LT Findings +----------------+-----------+---------------+----------------+-----------+       0.49                 Popliteal fossa      0.50                  +----------------+-----------+---------------+----------------+-----------+       0.44                  Proximal calf       0.57                  +----------------+-----------+---------------+----------------+-----------+       0.36                    Mid calf          0.50                  +----------------+-----------+---------------+----------------+-----------+  0.29                   Distal calf        0.35                  +----------------+-----------+---------------+----------------+-----------+ Diagnosing physician: Coral Else MD Electronically signed by Coral Else MD on 02/28/2023 at 10:16:31 PM.    Final    VAS US CAROTID  Result Date:  02/28/2023 Carotid Arterial Duplex Study Patient Name:  Julianna Cuartas.  Date of Exam:   02/28/2023 Medical Rec #: [id number]         Accession #:    [id number] Date of Birth: August 27, 1966         Patient Gender: M Patient Age:   70 years Exam Location:  [facility] Procedure:      VAS US CAROTID Referring Phys: Cristal Deer DICKSON --------------------------------------------------------------------------------  Indications:  Bilateral bruits. Risk Factors: Hyperlipidemia, Diabetes, PAD. Performing Technologist: McKayla Maag RVT, [place]  Examination Guidelines: A complete evaluation includes B-mode imaging, spectral Doppler, color Doppler, and power Doppler as needed of all accessible portions of each vessel. Bilateral testing is considered an integral part of a complete examination. Limited examinations for reoccurring indications may be performed as noted.  Right Carotid Findings: +----------+--------+--------+--------+---------------------+------------------+           PSV cm/sEDV cm/sStenosisPlaque Description   Comments           +----------+--------+--------+--------+---------------------+------------------+ CCA Prox  69      3                                    intimal thickening +----------+--------+--------+--------+---------------------+------------------+ CCA Distal46      6                                    intimal thickening +----------+--------+--------+--------+---------------------+------------------+ ICA Prox  40      12      1-39%   heterogenous,                                                             irregular and                                                             calcific                                +----------+--------+--------+--------+---------------------+------------------+ ICA Mid   78      19                                                      +----------+--------+--------+--------+---------------------+------------------+  ICA Distal67      23                                                      +----------+--------+--------+--------+---------------------+------------------+  ECA       53      7                                                       +----------+--------+--------+--------+---------------------+------------------+ +----------+--------+-------+----------------+-------------------+           PSV cm/sEDV cmsDescribe        Arm Pressure (mmHG) +----------+--------+-------+----------------+-------------------+ Subclavian102            Multiphasic, WNL                    +----------+--------+-------+----------------+-------------------+ +---------+--------+--+--------+-+---------+ VertebralPSV cm/s75EDV cm/s9Antegrade +---------+--------+--+--------+-+---------+  Left Carotid Findings: +----------+-------+--------+--------+-----------------------+-----------------+           PSV    EDV cm/sStenosisPlaque Description     Comments                    cm/s                                                            +----------+-------+--------+--------+-----------------------+-----------------+ CCA Prox  77     6                                                        +----------+-------+--------+--------+-----------------------+-----------------+ CCA Distal57     11                                     intimal                                                                   thickening        +----------+-------+--------+--------+-----------------------+-----------------+ ICA Prox  43     12      1-39%   homogeneous and                                                           irregular                                +----------+-------+--------+--------+-----------------------+-----------------+ ICA Mid   65     22                                                        +----------+-------+--------+--------+-----------------------+-----------------+  ICA Distal83     25                                                       +----------+-------+--------+--------+-----------------------+-----------------+ ECA       149    1                                                        +----------+-------+--------+--------+-----------------------+-----------------+ +----------+--------+--------+----------------+-------------------+           PSV cm/sEDV cm/sDescribe        Arm Pressure (mmHG) +----------+--------+--------+----------------+-------------------+ [id number]              Multiphasic, WNL                    +----------+--------+--------+----------------+-------------------+ +---------+--------+---+--------+----------+ VertebralPSV cm/s114EDV cm/sRetrograde +---------+--------+---+--------+----------+   Summary: Right Carotid: Velocities in the right ICA are consistent with a 1-39% stenosis. Left Carotid: Velocities in the left ICA are consistent with a 1-39% stenosis. Vertebrals:  Right vertebral artery demonstrates antegrade flow. Left vertebral              artery demonstrates retrograde flow. Subclavians: Normal flow hemodynamics were seen in bilateral subclavian              arteries. *See table(s) above for measurements and observations.  Electronically signed by Coral Else MD on 02/28/2023 at 10:15:51 PM.    Final    PERIPHERAL VASCULAR CATHETERIZATION  Result Date: 02/28/2023 Images from the original result were not included.   Patient name: Xamir Rohlik.         MRN: [id number]        DOB: February 26, 1966          Sex: male  02/28/2023 Pre-operative Diagnosis: Critical limb ischemia of the left lower extremity with tissue loss (heel wound and 2nd toe wound) Post-operative diagnosis:  Same Surgeon:  Cephus Shelling, MD Procedure Performed: 1.  Ultrasound-guided access right common femoral artery 2.  Aortogram with catheter selection of  aorta 3.  Left lower extremity arteriogram with selection of third order branches 4.  Left common iliac artery angioplasty with stent placement (8 mm x 39 mm VBX) 5.  36 minutes of monitored moderate conscious sedation time  Indications: Patient is a 56 year old male well-known to the vascular surgery service that previously underwent left iliac artery stenting with a left common femoral to posterior tibial artery bypass with composite for tissue loss.  Has not been seen since 2020.  In the interim he had revascularization in the right leg at [facility].  He now presents with left lower extremity tissue loss with an occluded left leg bypass.  He presents for aortogram, lower extremity arteriogram, with a focus on the left leg after risks and benefits discussed.  Findings:  Aortogram showed both renal arteries are patent.  The infra-renal aorta is patent.  The left common iliac artery stent was patent but the distal common iliac artery beyond the existing stent had high-grade calcified stenosis over 90%.  The left hypogastric was patent but severely diseased with a high-grade stenosis at the ositum.  The left external iliac artery stent was widely patent.  The left common iliac high-grade stenosis was then treated with a 8 mm x 39 mm VBX with excellent results.  Left lower extremity runoff showed a very diseased common femoral with a high-grade stenosis in the proximal profunda.  The SFA is diffusely diseased.  This occludes above the knee in the popliteal artery.  His below-knee popliteal artery is occluded but does reconstitute retrograde.  Dominant runoff in the foot is through the posterior tibial and peroneal.  Trifurcation appears occluded.             Procedure:  The patient was identified in the holding area and taken to room 7.  The patient was then placed supine on the table and prepped and draped in the usual sterile fashion.  A time out was called.  The patient received Versed and fentanyl for conscious  moderate sedation.  Vital signs were monitored including heart rate, respiratory rate, oxygenation and blood pressure.  I was present for all of moderate sedation.  Ultrasound was used to evaluate the right common femoral artery.  It was patent .  A digital ultrasound image was acquired.  A micropuncture needle was used to access the right common femoral artery under ultrasound guidance.  An 018 wire was advanced without resistance and a micropuncture sheath was placed.  The 018 wire was removed and a benson wire was placed.  The micropuncture sheath was exchanged for a 5 french sheath.  An omniflush catheter was advanced over the wire to the level of L-1.  An abdominal angiogram was obtained.  We then got some dedicated iliac imaging.  I elected to treat the left common iliac artery high-grade calcified stenosis.  I used a Glidewire advantage with a Omni Flush catheter to cross the aortic bifurcation and got my wire into the left SFA.  I upsized to a long 7 [place] Cat[place]ault sheath in the right groin over the aortic bifurcation.  Patient was given 100 units/kg IV heparin.  We then got a planning shot through hand-injection and then selected a 8 mm x 39 mm VBX deployed in the distal common iliac artery while preserving the hypogastric.  Excellent results.  No significant residual stenosis.  The hypogastric was preserved.  We then used our sheath to get left lower extremity runoff with pertinent findings noted above.  Patient will need left common femoral endarterectomy with profundoplasty and a redo fem-tib bypass.  Wires and catheters were removed after he put a short 7 French sheath in the right groin.   Plan: Good results after left common iliac artery stenting today for high-grade stenosis, but multi-level disease.  Needs a common femoral endarterectomy with profundoplasty and redo femoral to tibial bypass in the left leg.  This will be scheduled for Monday with me in the OR.  Cephus Shelling, MD [facility] Office: [phone]   VAS [place] LOWER EXTREMITY VENOUS (DVT) (ONLY [facility] & [facility])  Result Date: 02/27/2023  Lower Venous DVT Study Patient Name:  Delvante Hilling.  Date of Exam:   02/27/2023 Medical Rec #: [id number]         Accession #:    [id number] Date of Birth: 03-Nov-1965         Patient Gender: M Patient Age:   71 years Exam Location:  [facility] Procedure:      VAS [place] LOWER EXTREMITY VENOUS (DVT) Referring Phys: Benjiman Core --------------------------------------------------------------------------------  Indications: Pain, and Swelling.  Comparison Study: Previous bilateral 10/19/19  negative. Performing Technologist: McKayla Maag RVT, [place]  Examination Guidelines: A complete evaluation includes B-mode imaging, spectral Doppler, color Doppler, and power Doppler as needed of all accessible portions of each vessel. Bilateral testing is considered an integral part of a complete examination. Limited examinations for reoccurring indications may be performed as noted. The reflux portion of the exam is performed with the patient in reverse Trendelenburg.  +-----+---------------+---------+-----------+----------+--------------+ RIGHTCompressibilityPhasicitySpontaneityPropertiesThrombus Aging +-----+---------------+---------+-----------+----------+--------------+ CFV  Full           Yes      Yes                                 +-----+---------------+---------+-----------+----------+--------------+ SFJ  Full                                                        +-----+---------------+---------+-----------+----------+--------------+   +---------+---------------+---------+-----------+----------+--------------+ LEFT     CompressibilityPhasicitySpontaneityPropertiesThrombus Aging +---------+---------------+---------+-----------+----------+--------------+ CFV      Full           Yes      Yes                                  +---------+---------------+---------+-----------+----------+--------------+ SFJ      Full                                                        +---------+---------------+---------+-----------+----------+--------------+ FV Prox  Full                                                        +---------+---------------+---------+-----------+----------+--------------+ FV Mid   Full                                                        +---------+---------------+---------+-----------+----------+--------------+ FV DistalFull                                                        +---------+---------------+---------+-----------+----------+--------------+ PFV      Full                                                        +---------+---------------+---------+-----------+----------+--------------+ POP      Full           Yes      Yes                                 +---------+---------------+---------+-----------+----------+--------------+  PTV      Full                                                        +---------+---------------+---------+-----------+----------+--------------+ PERO     Full                                                        +---------+---------------+---------+-----------+----------+--------------+    Summary: RIGHT: - No evidence of common femoral vein obstruction.  LEFT: - There is no evidence of deep vein thrombosis in the lower extremity.  - No cystic structure found in the popliteal fossa. - Ultrasound characteristics of enlarged lymph nodes noted in the groin.  *See table(s) above for measurements and observations. Electronically signed by Waverly Ferrari MD on 02/27/2023 at 1:02:46 PM.    Final    VAS [place] LOWER EXTREMITY ARTERIAL DUPLEX (ONLY [facility] & [facility])  Result Date: 02/27/2023 LOWER EXTREMITY ARTERIAL DUPLEX STUDY Patient Name:  Hades Ovitt.  Date of Exam:   02/27/2023 Medical Rec #: [id number]         Accession #:    [id number]  Date of Birth: 05-24-1966         Patient Gender: M Patient Age:   25 years Exam Location:  [facility] Procedure:      VAS [place] LOWER EXTREMITY ARTERIAL DUPLEX Referring Phys: Benjiman Core --------------------------------------------------------------------------------  Indications: Rest pain, ulceration, and peripheral artery disease. High Risk Factors: Hypertension, Diabetes, past history of smoking.  Current ABI: Previous ABI 05/20/19              Non-compressible Limitations: Acoustic shadowing Comparison Study: No previous study. Performing Technologist: McKayla Maag RVT, [place]  Examination Guidelines: A complete evaluation includes B-mode imaging, spectral Doppler, color Doppler, and power Doppler as needed of all accessible portions of each vessel. Bilateral testing is considered an integral part of a complete examination. Limited examinations for reoccurring indications may be performed as noted.   +-----------+--------+-----+---------------+----------+------------------------+ LEFT       PSV cm/sRatioStenosis       Waveform  Comments                 +-----------+--------+-----+---------------+----------+------------------------+ EIA Mid    113                         monophasic                         +-----------+--------+-----+---------------+----------+------------------------+ CFA Mid    63                          monophasic                         +-----------+--------+-----+---------------+----------+------------------------+ DFA        212          50-74% stenosismonophasic                         +-----------+--------+-----+---------------+----------+------------------------+ SFA Prox   46  monophasic                         +-----------+--------+-----+---------------+----------+------------------------+ SFA Mid    25                          monophasic                          +-----------+--------+-----+---------------+----------+------------------------+ SFA Distal 25                          monophasic                         +-----------+--------+-----+---------------+----------+------------------------+ POP Distal 110                         monophasicElevated focal velocity                                                   unable to accurately                                                      assess stenosis                                                           secondary to acoustic                                                     shadowing                +-----------+--------+-----+---------------+----------+------------------------+ TP Trunk                                         Not visualized due to                                                     acoustic shadowing       +-----------+--------+-----+---------------+----------+------------------------+ ATA Distal 22                          monophasic                         +-----------+--------+-----+---------------+----------+------------------------+ PTA Distal 21                          monophasic                         +-----------+--------+-----+---------------+----------+------------------------+  PERO Distal29                          monophasic                         +-----------+--------+-----+---------------+----------+------------------------+ DP         24                          monophasic                         +-----------+--------+-----+---------------+----------+------------------------+ A focal velocity elevation of 212 cm/s was obtained at [facility] with a VR of 1.4. Findings are characteristic of 50-74% stenosis.  Summary: Left: 50-74% stenosis noted in the deep femoral artery. Abnormal left external iliac artery waveform suggestive of inflow disease.  See table(s) above for measurements and observations.  Electronically signed by Waverly Ferrari MD on 02/27/2023 at 1:02:28 PM.    Final    DG Foot Complete Left  Result Date: 02/27/2023 CLINICAL DATA:  swelling, pain EXAM: LEFT FOOT - COMPLETE 3+ VIEW COMPARISON:  None Available. FINDINGS: Osteopenia. No acute fracture or dislocation. Mild degenerative changes of the first MTP. Possible small periarticular erosion at the base of the first TMT which may reflect underlying crystalline arthropathy. No unexpected radiopaque foreign body. Soft tissue irregularity of the heel and lateral fifth toe. Diffuse vascular calcifications. No subjacent cortical erosions or irregularity. Soft tissue edema. IMPRESSION: 1.  No radiographic evidence of osteomyelitis. 2. Possible small periarticular erosion at the base of the first TMT which may reflect underlying crystalline arthropathy. Electronically Signed   By: Meda Klinefelter M.D.   On: 02/27/2023 09:46   DG Chest 2 View  Result Date: 02/27/2023 CLINICAL DATA:  r/o sepsis EXAM: CHEST - 2 VIEW COMPARISON:  December 13, 2021 FINDINGS: The cardiomediastinal silhouette is unchanged and enlarged in contour. No significant pleural effusion. No pneumothorax. Perihilar vascular fullness, peribronchial cuffing and mild diffuse interstitial prominence. Visualized abdomen is unremarkable. Atherosclerotic calcifications. IMPRESSION: Constellation of findings are favored to reflect mild pulmonary edema. Electronically Signed   By: Meda Klinefelter M.D.   On: 02/27/2023 09:42     Discharge Instructions: Discharge Instructions     Diet - low sodium heart healthy   Complete by: As directed    Discharge wound care:   Complete by: As directed    Per nursing   Face-to-face encounter (required for Medicare/Medicaid patients)   Complete by: As directed    I Willette Cluster certify that this patient is under my care and that I, or a nurse practitioner or physician's assistant working with me, had a face-to-face encounter that  meets the physician face-to-face encounter requirements with this patient on 03/12/2023. The encounter with the patient was in whole, or in part for the following medical condition(s) which is the primary reason for home health care (List medical condition): PAD s/p bypass and L 2nd toe amputation, calcaneal exision and osteomyelitis   The encounter with the patient was in whole, or in part, for the following medical condition, which is the primary reason for home health care: PAD s/p bypass and L 2nd toe amputation, calcaneal exision and osteomyelitis   I certify that, based on my findings, the following services are medically necessary home health services: Physical therapy   Reason for Medically Necessary Home Health Services:  Therapy- Investment banker, operational, Transfer  Teaching laboratory technician Therapy- Instruction on use of Assistive Device for Ambulation on all Surfaces Therapy- Home Adaptation to Facilitate Safety Therapy- Therapeutic Exercises to Increase Strength and Endurance     My clinical findings support the need for the above services:  Unsafe ambulation due to balance issues Pain interferes with ambulation/mobility     Further, I certify that my clinical findings support that this patient is homebound due to:  Pain interferes with ambulation/mobility Unsafe ambulation due to balance issues     Home Health   Complete by: As directed    To provide the following care/treatments:  PT OT     Increase activity slowly   Complete by: As directed    Negative Pressure Wound Therapy - Incisional   Complete by: As directed    Attached the wound VAC dressing to the Praveena plus portable wound VAC pump at discharge.     You were hospitalized for peripheral artery disease of the left leg causing poor blood flow and wounds to your foot. You ahd your second toe amputated and had surgeries to improve the blood flow to your leg. To help with blood flow you were started on a blood thinner called  xarelto and your lipid lowering medicine atorvastatin was increased. You were also found to have infection of the left heel skin and bone. You were treated with IV antibiotics during hospitalization. You will be discharged with oral antibiotics to complete a total of 6 weeks. You will follow up with Dr. Ninetta Lights in clinic to reevaluate your foot.   CHANGES TO MEDICATIONS:  Start xarelto 2.5mg  1 tablet twice daily Increase Atorvastatin to 80mg  1 tablet daily Increase Gabapentin to 400mg  1 tablet three times daily Start Augmentin 875-125mg  1 tablet twice daily until June 17th 2024 Start Levofloxacin 750mg  1 tablet daily until June 17th 2024 It was a pleasure taking care of you!  Signed: Willette Cluster, MD 03/12/2023, 3:07 PM   Pager: Willette Cluster, MD Internal Medicine Resident, PGY-1 [facility]  Pager: [phone]

## 2023-03-12 NOTE — TOC Transition Note (Signed)
Transition of Care (TOC) - CM/SW Discharge Note Donn Pierini RN, BSN Transitions of Care Unit 4E- RN Case Manager See Treatment Team for direct phone #   Patient Details  Name: Edward Rocha. MRN: [id number] Date of Birth: 09/29/1966  Transition of Care [facility]) CM/SW Contact:  Darrold Span, RN Phone Number: 03/12/2023, 4:29 PM   Clinical Narrative:    Pt stable for transition home today, Orders placed for HHPT/OT.  Pt will also go home with Prevena wound VAC that bedside RN will attach to pt prior to discharge.   CM was Notified by [employer] liaison -following patient with MD office protocol referral prearranged for [facility] needs- CM has reached out to liaison- Bjorn Loser to confirm if they can staff in [place] for needed services.  Per Bjorn Loser they can and referral has been accepted - they will contact pt post discharge to schedule.   Cm spoke with pt at bedside- discussed HH needs- per pt he used [facility] last year- discussed choice for [facility] services- pt states he is agreeable to [place] this time. Per OT pt would benefit from tub bench- insurance does not cover- pt will need to purchase out of pocket and will follow up if needed.   No further TOC needs noted.      Final next level of care: Home w Home Health Services Barriers to Discharge: Continued Medical Work up   Patient Goals and CMS Choice CMS Medicare.gov Compare Post Acute Care list provided to:: Patient Choice offered to / list presented to : Patient  Discharge Placement                   Home w/ [facility]      Discharge Plan and Services Additional resources added to the After Visit Summary for     Discharge Planning Services: CM Consult Post Acute Care Choice: Home Health, Durable Medical Equipment          DME Arranged: Tub bench DME Agency: NA (pt to purchase)       HH Arranged: PT, OT HH Agency: [facility] Date [facility] Agency Contacted: 03/12/23 Time HH Agency Contacted: 1600 Representative spoke  with at [facility] Agency: Bjorn Loser  Social Determinants of Health (SDOH) Interventions SDOH Screenings   Food Insecurity: No Food Insecurity (02/27/2023)  Housing: Low Risk  (02/27/2023)  Transportation Needs: No Transportation Needs (02/27/2023)  Utilities: Not At Risk (02/27/2023)  Financial Resource Strain: Low Risk  (09/21/2019)  Physical Activity: Insufficiently Active (09/21/2019)  Social Connections: Moderately Isolated (09/21/2019)  Stress: No Stress Concern Present (09/21/2019)  Tobacco Use: Medium Risk (03/10/2023)     Readmission Risk Interventions    03/12/2023    4:29 PM 12/08/2021   11:19 AM  Readmission Risk Prevention Plan  Transportation Screening Complete Complete  PCP or Specialist Appt within 5-7 Days Complete   Home Care Screening Complete   Medication Review (RN CM) Complete

## 2023-03-12 NOTE — Progress Notes (Signed)
Occupational Therapy Treatment Patient Details Name: Edward Rocha. MRN: [id number] DOB: May 19, 1966 Today's Date: 03/12/2023   History of present illness Pt is a 57 year old male admitted on 02/27/23 with foot pain and left heel wound. S/p 03/05/23 Redo exposure of left common femoral artery, left common femoral endarterectomy,redo left common femoral to posterior tibial bypass, partial left second toe amputation, debridement of left heel wound. Calcaneal debridement 5/10, now NWB. PMH significant for CAD, PAD, HTN, T2DM, CKD3a, HFrEF, hypothyroidism, and cirrhosis.   OT comments  Pt continuing to progress towards pt focused goals, Pt is now s/p L heel debridement and NWB on LLE. Pt currently supervision + RW and hop to gait. Discussed the importance of pacing and energy conservation to manage UE fatigue, also discussed with pt the use of tub bench to safely perform tranfers following new NWB status, gave pt a visual demonstration of Tub transfer. Reinforced NWB precautions, pt with good carryover of strategies to maintain NWB status. OT to continue to progress pt as able, DC plans remain appropriate for [facility] services.   Recommendations for follow up therapy are one component of a multi-disciplinary discharge planning process, led by the attending physician.  Recommendations may be updated based on patient status, additional functional criteria and insurance authorization.    Assistance Recommended at Discharge Set up Supervision/Assistance  Patient can return home with the following  A little help with bathing/dressing/bathroom;Direct supervision/assist for medications management;Direct supervision/assist for financial management;Assist for transportation;Help with stairs or ramp for entrance;Assistance with cooking/housework   Equipment Recommendations  Tub/shower bench    Recommendations for Other Services      Precautions / Restrictions Precautions Precautions: Fall Precaution Comments:  PRAFO boot at all times. Wound vac Restrictions Weight Bearing Restrictions: No LLE Weight Bearing: Non weight bearing       Mobility Bed Mobility Overal bed mobility: Modified Independent                  Transfers Overall transfer level: Needs assistance Equipment used: Rolling walker (2 wheels) Transfers: Sit to/from Stand Sit to Stand: Min guard           General transfer comment: Min guard to assist with steadying RW due to increased weight from wound vac being placed on one side of the RW     Balance Overall balance assessment: Needs assistance Sitting-balance support: No upper extremity supported, Feet supported Sitting balance-Leahy Scale: Good     Standing balance support: Bilateral upper extremity supported, Reliant on assistive device for balance Standing balance-Leahy Scale: Poor                             ADL either performed or assessed with clinical judgement   ADL                                       Functional mobility during ADLs: Supervision/safety;Rolling walker (2 wheels) General ADL Comments: hop to gait pattern, ambulated just beyond doorway then back due to patient's UEs fatiguing    Extremity/Trunk Assessment              Vision       Perception     Praxis      Cognition Arousal/Alertness: Awake/alert Behavior During Therapy: WFL for tasks assessed/performed Overall Cognitive Status: Within Functional Limits for tasks assessed  General Comments: Pt with good carryover of strategies to maintain NWB status        Exercises      Shoulder Instructions       General Comments PRAFO boot on throughout entirety of session. VSS on RA    Pertinent Vitals/ Pain       Pain Assessment Pain Assessment: No/denies pain  Home Living                                          Prior Functioning/Environment               Frequency  Min 1X/week        Progress Toward Goals  OT Goals(current goals can now be found in the care plan section)  Progress towards OT goals: Progressing toward goals  Acute Rehab OT Goals Patient Stated Goal: to go home OT Goal Formulation: With patient Time For Goal Achievement: 03/20/23 Potential to Achieve Goals: Good  Plan Discharge plan remains appropriate;Frequency remains appropriate    Co-evaluation                 AM-PAC OT "6 Clicks" Daily Activity     Outcome Measure   Help from another person eating meals?: None Help from another person taking care of personal grooming?: A Little Help from another person toileting, which includes using toliet, bedpan, or urinal?: A Little Help from another person bathing (including washing, rinsing, drying)?: A Little Help from another person to put on and taking off regular upper body clothing?: A Little Help from another person to put on and taking off regular lower body clothing?: A Little 6 Click Score: 19    End of Session Equipment Utilized During Treatment: Rolling walker (2 wheels);Gait belt  OT Visit Diagnosis: Unsteadiness on feet (R26.81);Other abnormalities of gait and mobility (R26.89);Muscle weakness (generalized) (M62.81)   Activity Tolerance Patient tolerated treatment well   Patient Left in chair;with call bell/phone within reach   Nurse Communication Mobility status        Time: 1510-1530 OT Time Calculation (min): 20 min  Charges: OT General Charges $OT Visit: 1 Visit OT Treatments $Therapeutic Activity: 8-22 mins  03/12/2023  AB, OTR/L  Acute Rehabilitation Services  Office: [phone]   Tristan Schroeder 03/12/2023, 4:14 PM

## 2023-03-12 NOTE — Progress Notes (Signed)
Mobility Specialist Progress Note:    03/12/23 1022  Mobility  Activity Ambulated with assistance in hallway  Level of Assistance Contact guard assist, steadying assist  Assistive Device Front wheel walker  Distance Ambulated (ft) 60 ft  LLE Weight Bearing NWB  Activity Response Tolerated well  Mobility Referral Yes  $Mobility charge 1 Mobility  Mobility Specialist Start Time (ACUTE ONLY) 1005  Mobility Specialist Stop Time (ACUTE ONLY) 1020  Mobility Specialist Time Calculation (min) (ACUTE ONLY) 15 min   Pt received in chair, agreeable to ambulate in halls. No c/o throughout and maintain NWB on LLE. Pt assisted back in chair with call bell at hand.   Thompson Grayer Mobility Specialist  Please contact vis Secure Chat or  Rehab Office [phone]

## 2023-03-13 ENCOUNTER — Other Ambulatory Visit: Payer: Self-pay | Admitting: Orthopedic Surgery

## 2023-03-13 ENCOUNTER — Telehealth: Payer: Self-pay

## 2023-03-13 ENCOUNTER — Telehealth: Payer: Self-pay | Admitting: Physician Assistant

## 2023-03-13 LAB — AEROBIC/ANAEROBIC CULTURE W GRAM STAIN (SURGICAL/DEEP WOUND)

## 2023-03-13 MED ORDER — CIPROFLOXACIN HCL 500 MG PO TABS
500.0000 mg | ORAL_TABLET | Freq: Two times a day (BID) | ORAL | 0 refills | Status: DC
Start: 2023-03-13 — End: 2023-04-16

## 2023-03-13 NOTE — Telephone Encounter (Signed)
-----   Message from [name], MD sent at 03/13/2023  9:15 AM EDT ----- Call patient.  wound is showing additional bacteria .  Cipro was added to his antibiotic regiment.  Continue taking his Augmentin as well. ----- Message ----- From: Interface, Lab In [place] Sent: 03/13/2023   7:54 AM EDT To: [name], MD

## 2023-03-13 NOTE — Telephone Encounter (Signed)
Tried to call pt to advise of additional ABX and mailbox is full. Will hold and try again.

## 2023-03-13 NOTE — Telephone Encounter (Signed)
-----   Message from Emilie Rutter, PA-C sent at 03/12/2023  8:14 AM EDT -----  2 weeks with PA on [facility] clinic day.  PO L leg redo bypass and 2nd toe amp Thanks

## 2023-03-14 ENCOUNTER — Telehealth: Payer: Self-pay

## 2023-03-14 ENCOUNTER — Encounter: Payer: Self-pay | Admitting: Orthopedic Surgery

## 2023-03-14 LAB — AEROBIC/ANAEROBIC CULTURE W GRAM STAIN (SURGICAL/DEEP WOUND)

## 2023-03-14 NOTE — Telephone Encounter (Signed)
I tried to call pt again and there is no answer. Unable to leave a vm. I will hold and try again.

## 2023-03-14 NOTE — Telephone Encounter (Addendum)
Patient was called to follow-up on recent hospital admission.   Patient reports he is doing well at home. He is taking his medications as prescribed and has the discharge instructions on his nightstand. Patient was informed that orthopedics ordered ciprofloxacin to treat his wound infection, and he should discontinue the levofloxacin once the ciprofloxacin is picked up from the pharmacy. He confirmed understanding this medication change.   He reports he is able to move around the house with his walker or power wheelchair. He has not had any falls. HH PT/OT has yet to be set up, but he is currently in contact with the company to establish care. He reports the extra care will be helpful.  He is aware of his follow-up appointments with orthopedics, vascular surgery, and infectious disease. He confirmed the dates and times of the appointments with different providers. He had no questions or concerns regarding these appointments.  He did report some confusion with how to care for the wound vac. Patient was advised to keep the wound vac in place until he follows-up with Dr. Lajoyce Corners in the clinic. He was provided instructions with how to empty the canister since he reported it was almost full.  Patient was also informed of the negative DVT study, which had yet to be resulted at the time of discharge. Attestation for Student Documentation:  I personally was present and performed or re-performed the history, physical exam and medical decision-making activities of this service and have verified that the service and findings are accurately documented in the student's note.  Willette Cluster, MD 03/14/2023, 1:58 PM

## 2023-03-15 NOTE — Telephone Encounter (Signed)
The home [phone] is now not a working number for the pt. I calld cell [phone] and left a very detailed message on vm that advised his additional ABX was sent to [facility] and that he should take this ( Cipro) in addition to his Augmentin and to call with any questions. He has an appt on 03/22/2023

## 2023-03-16 ENCOUNTER — Encounter (HOSPITAL_COMMUNITY): Payer: Self-pay

## 2023-03-16 ENCOUNTER — Telehealth: Payer: Self-pay

## 2023-03-16 ENCOUNTER — Emergency Department (HOSPITAL_COMMUNITY)
Admission: EM | Admit: 2023-03-16 | Discharge: 2023-03-16 | Disposition: A | Discharge status: Home / Self Care | Payer: Medicare Other | Attending: Emergency Medicine | Admitting: Emergency Medicine

## 2023-03-16 ENCOUNTER — Other Ambulatory Visit: Payer: Self-pay

## 2023-03-16 DIAGNOSIS — Z7982 Long term (current) use of aspirin: Secondary | ICD-10-CM | POA: Insufficient documentation

## 2023-03-16 DIAGNOSIS — E119 Type 2 diabetes mellitus without complications: Secondary | ICD-10-CM | POA: Diagnosis not present

## 2023-03-16 DIAGNOSIS — Z7901 Long term (current) use of anticoagulants: Secondary | ICD-10-CM | POA: Diagnosis not present

## 2023-03-16 DIAGNOSIS — N189 Chronic kidney disease, unspecified: Secondary | ICD-10-CM | POA: Insufficient documentation

## 2023-03-16 DIAGNOSIS — Z4800 Encounter for change or removal of nonsurgical wound dressing: Secondary | ICD-10-CM | POA: Insufficient documentation

## 2023-03-16 DIAGNOSIS — Z5189 Encounter for other specified aftercare: Secondary | ICD-10-CM

## 2023-03-16 DIAGNOSIS — Z7984 Long term (current) use of oral hypoglycemic drugs: Secondary | ICD-10-CM | POA: Insufficient documentation

## 2023-03-16 NOTE — Telephone Encounter (Signed)
Nino Glow, PT with [facility] called stating that when she went to the pt's home for start of care, the pt informed her that the wound vac was not working and had been off for the past 2 days. He told her he was not given wound care supplies. She spoke with her supervisor who instructed her to call EMS to have pt transported to the ED for wound care.  Reviewed pt's chart, returned call for clarification, two identifiers used. She stated that there were no HH orders for wound care, only PT/OT.   Called Bjorn Loser, [facility] area Production designer, theatre/television/film. Pt had Prevena wound vac, so no wound care would have been ordered. Dr. Lajoyce Corners applied the wound vac after his last surgery. No instructions for wound care in d/c instructions. Bjorn Loser stated that she would f/u on this.

## 2023-03-16 NOTE — ED Notes (Signed)
Pt rt foot cold to touch, pale color to sole. Pt able to wiggle toes.

## 2023-03-16 NOTE — ED Triage Notes (Signed)
[facility] called by home health physical therapist for lft 2nd toe amputation. Per HH wound vac is not working and toe is open to air.

## 2023-03-16 NOTE — Discharge Instructions (Addendum)
After speaking with your surgeon Dr. Lajoyce Corners he does not recommend that we replace the wound VAC, you will need to continue to change the dressing, and follow-up next week as planned.  Please return for any concern for developing infection including worsening pain, redness, pus draining from the affected site.  We have sent you home with some wound care supplies.  I recommend daily wound change, please remove the gauze on the posterior heel as well as second toe, and rewrap with gauze, Ace bandage.

## 2023-03-16 NOTE — ED Provider Notes (Signed)
Edward Rocha Provider Note   CSN: [id number] Arrival date & time: 03/16/23  1335     History  Chief Complaint  Patient presents with   Wound Check    Edward Rocha. is a 57 y.o. male with past medical history significant for diabetes, PVD, critical lower limb ischemia, CKD, with recent left second toe amputation.  EMS was called secondary to home health report that wound VAC is not working, toe is open to the air.  Patient with amputation by Dr. Lajoyce Rocha on 5/6, left heel debridement on 5/10.  He reports he has been discharged from the hospital less than 2 weeks, wound VAC in place since then.  He is not sure what his follow-up appointment should be.  Patient does report some increasing pain.  Patient reports that he has not been able to take good care of the wound, reports that there is cat hair all over his floor.  He has injury on the other foot and so has been overly reliant on his recent amputated foot.   Wound Check       Home Medications Prior to Admission medications   Medication Sig Start Date End Date Taking? Authorizing Provider  acetaminophen (TYLENOL) 500 MG tablet Take 1 tablet (500 mg total) by mouth daily as needed for moderate pain or headache. Patient taking differently: Take 1,000 mg by mouth daily as needed for moderate pain or headache. 12/19/21   Zannie Cove, MD  amoxicillin-clavulanate (AUGMENTIN) 875-125 MG tablet Take 1 tablet by mouth 2 (two) times daily. 03/12/23 04/11/23  Willette Cluster, MD  aspirin EC 81 MG tablet Take 81 mg by mouth daily.    [provider]  atorvastatin (LIPITOR) 80 MG tablet Take 1 tablet (80 mg total) by mouth daily. 03/13/23   Willette Cluster, MD  ciprofloxacin (CIPRO) 500 MG tablet Take 1 tablet (500 mg total) by mouth 2 (two) times daily. 03/13/23   [name], MD  dapagliflozin propanediol (FARXIGA) 10 MG TABS tablet Take 1 tablet (10 mg total) by mouth daily. Patient taking  differently: Take 10 mg by mouth at bedtime. 08/14/22   Jonelle Sidle, MD  Elastic Bandages & Supports (KNEE SUPPORT/ELASTIC/FIRM MED) MISC 1 each by Does not apply route as directed. Medium pressure compression stockings Dx: leg edema 01/13/22   Jonelle Sidle, MD  furosemide (LASIX) 40 MG tablet Take 1 tablet (40 mg total) by mouth daily. Patient taking differently: Take 40 mg by mouth at bedtime. 08/14/22   Jonelle Sidle, MD  gabapentin (NEURONTIN) 400 MG capsule Take 1 capsule (400 mg total) by mouth 3 (three) times daily. 03/12/23 04/11/23  Willette Cluster, MD  levofloxacin (LEVAQUIN) 750 MG tablet Take 1 tablet (750 mg total) by mouth daily. 03/12/23 04/11/23  Willette Cluster, MD  metoprolol succinate (TOPROL XL) 50 MG 24 hr tablet Take 1 tablet (50 mg total) by mouth daily. Patient taking differently: Take 50 mg by mouth at bedtime. 11/02/22   Sharlene Dory, NP  nitroGLYCERIN (NITROSTAT) 0.4 MG SL tablet Place 0.4 mg under the tongue every 5 (five) minutes as needed for chest pain.    [provider]  oxyCODONE (OXY IR/ROXICODONE) 5 MG immediate release tablet Take 1 tablet (5 mg total) by mouth every 6 (six) hours as needed for up to 5 days for severe pain. 03/12/23 03/17/23  Willette Cluster, MD  rivaroxaban (XARELTO) 2.5 MG TABS tablet Take 1 tablet (2.5 mg total) by  mouth 2 (two) times daily. 03/12/23 04/11/23  Willette Cluster, MD  sacubitril-valsartan (ENTRESTO) 24-26 MG Take 1 tablet by mouth 2 (two) times daily. 11/02/22   Sharlene Dory, NP  spironolactone (ALDACTONE) 25 MG tablet Take 0.5 tablets (12.5 mg total) by mouth daily. Patient taking differently: Take 25 mg by mouth at bedtime. 08/14/22   Jonelle Sidle, MD      Allergies    Other    Review of Systems   Review of Systems  All other systems reviewed and are negative.   Physical Exam Updated Vital Signs BP 126/81 (BP Location: Left Arm)   Pulse 96   Temp (!) 97.5 F (36.4 C) (Oral)   Resp 20   Ht 6\' 1"   (1.854 m)   Wt 83.9 kg   SpO2 100%   BMI 24.41 kg/m  Physical Exam Vitals and nursing note reviewed.  Constitutional:      General: He is not in acute distress.    Appearance: Normal appearance.  HENT:     Head: Normocephalic and atraumatic.  Eyes:     General:        Right eye: No discharge.        Left eye: No discharge.  Cardiovascular:     Rate and Rhythm: Normal rate and regular rhythm.     Comments: Patient with thready pulses at DP, PT bilaterally, they are very minimally ascertained with Doppler device, not palpable to touch.  He does have intact capillary refill, it is greater than 3 seconds but still present.  Less than 5 seconds. Pulmonary:     Effort: Pulmonary effort is normal. No respiratory distress.  Musculoskeletal:        General: No deformity.  Skin:    General: Skin is warm and dry.     Comments: Patient with appropriately healing surgical site, both posterior heel debridement and second toe secondary to recent amputation, left heel debridement.  I see no evidence of developing infection, surrounding skin fluctuance, purulent drainage.  There are the remnants of a wound VAC which patient reports was not sticking, we pulled the rest of the plastic dressing off in the ED, and redressed.  Neurological:     Mental Status: He is alert and oriented to person, place, and time.  Psychiatric:        Mood and Affect: Mood normal.        Behavior: Behavior normal.     ED Results / Procedures / Treatments   Labs (all labs ordered are listed, but only abnormal results are displayed) Labs Reviewed - No data to display  EKG None  Radiology No results found.  Procedures Procedures    Medications Ordered in ED Medications - No data to display  ED Course/ Medical Decision Making/ A&P                             Medical Decision Making  This patient is a 57 y.o. male who presents to the ED for concern of wound check, patient is status post recent amputation  of left second toe, and debridement of posterior heel.  He reports his wound VAC was not working properly.  He is post to follow-up with Dr. Lajoyce Rocha next week..   Differential diagnoses prior to evaluation: Wound infection, worsening osteomyelitis, versus encounter for simple wound care need for wound care instructions to be administered  Past Medical History / Social History / Additional history: Chart  reviewed. Pertinent results include: Peripheral vascular disease, diabetes, recent amputation and hospitalization  Physical Exam: Physical exam performed. The pertinent findings include:  Patient with thready pulses at DP, PT bilaterally, they are very minimally ascertained with Doppler device, not palpable to touch.  He does have intact capillary refill, it is greater than 3 seconds but still present.  Less than 5 seconds.  Patient with appropriately healing surgical site, both posterior heel debridement and second toe secondary to recent amputation, left heel debridement.  I see no evidence of developing infection, surrounding skin fluctuance, purulent drainage.  There are the remnants of a wound VAC which patient reports was not sticking, we pulled the rest of the plastic dressing off in the ED, and redressed.  Consultation: Spoke with surgeon, Dr. Lajoyce Rocha who agrees that patient no longer needs wound VAC at this time, recommends 4 x 4 gauze, Ace wrap, and close follow-up in his clinic.  Patient understands and agrees to this plan, wound redressed prior to discharge.   Disposition: After consideration of the diagnostic results and the patients response to treatment, I feel that patient is stable for discharge with wound redressed, wound VAC removed, he will follow-up with Dr. Lajoyce Rocha next week.   emergency department workup does not suggest an emergent condition requiring admission or immediate intervention beyond what has been performed at this time. The plan is: as above. The patient is safe for  discharge and has been instructed to return immediately for worsening symptoms, change in symptoms or any other concerns.  Final Clinical Impression(s) / ED Diagnoses Final diagnoses:  Visit for wound check    Rx / DC Orders ED Discharge Orders     None         [place] 03/16/23 1600    Bethann Berkshire, MD 03/17/23 [phone]

## 2023-03-19 ENCOUNTER — Telehealth: Payer: Self-pay | Admitting: Orthopedic Surgery

## 2023-03-19 NOTE — Telephone Encounter (Signed)
[facility] pt nurse called said the was seen Friday and he wants a "medacist" (what nurse said), a nurse and therapy and he would prefer to see her again he is having a hard time with dressing and he could not get the dressings back on himself the PT nurse had to help him is was very dirty and he would benefit greatly having a nurse, he is good weight bearing on left foot and is doing well transferring  CB number is [phone] Vinnie Langton

## 2023-03-19 NOTE — Telephone Encounter (Signed)
Called Gretchen, advised okay for [facility] to come in and eval, educate on wound dressings. He has an appt with [place] this week on 03/22/23.

## 2023-03-21 ENCOUNTER — Telehealth: Payer: Self-pay | Admitting: Orthopedic Surgery

## 2023-03-21 NOTE — Telephone Encounter (Signed)
[facility] nurse called in stating patient needs an extension for care being referral has not been placed for else where care for them ends this Friday, she also stated the patient wanted to go back to [facility] in [place] being he is unable to change dressing himself per his request.

## 2023-03-21 NOTE — Telephone Encounter (Signed)
Pt is s/p a left heel debridement 03/09/2023 and has a follow up appt in the office tomorrow. Will hold this message and call with updated orders after visit.

## 2023-03-22 ENCOUNTER — Ambulatory Visit (INDEPENDENT_AMBULATORY_CARE_PROVIDER_SITE_OTHER): Payer: Medicare Other | Admitting: Orthopedic Surgery

## 2023-03-22 ENCOUNTER — Encounter: Payer: Self-pay | Admitting: Orthopedic Surgery

## 2023-03-22 DIAGNOSIS — L97424 Non-pressure chronic ulcer of left heel and midfoot with necrosis of bone: Secondary | ICD-10-CM

## 2023-03-22 NOTE — Progress Notes (Signed)
Office Visit Note   Patient: Edward Rocha.           Date of Birth: [birth year]           MRN: [id number] Visit Date: 03/22/2023              Requested by: Shelby Dubin, FNP [address] [place],  [place] [address] PCP: Shelby Dubin, FNP  Chief Complaint  Patient presents with   Left Foot - Routine Post Op    03/09/2023 left heel debridement 38 micro and 7x10 sheet with NEW VAC SPONGE       HPI: Patient is a 57 year old gentleman who is seen 13 days status post left heel debridement placement of Kerecis micro graft 38 cm and Kerecis sheet 7 x 10 cm.  Patient had application of the new wound VAC sponge.  Patient states that it stopped working a couple days ago.  Assessment & Plan: Visit Diagnoses:  1. Heel ulcer, left, with necrosis of bone (HCC)     Plan: A cleanse choice wound VAC sponge was applied with a Prevena VAC.  This had a good suction fit compression from the metatarsals to the tibial tubercle.  Follow-Up Instructions: No follow-ups on file.   Ortho Exam  Patient is alert, oriented, no adenopathy, well-dressed, normal affect, normal respiratory effort. Examination patient has no incorporation of the graft tissue.  The graft tissue is hanging loosely, the wound edges are macerated the wound bed has residual hematoma.  Patient has healed well the second toe amputation.  Imaging: No results found.    Labs: Lab Results  Component Value Date   HGBA1C 6.5 (H) 02/27/2023   HGBA1C 6.9 (H) 12/07/2021   HGBA1C 5.3 10/18/2019   ESRSEDRATE 62 (H) 02/28/2023   ESRSEDRATE 85 (H) 05/29/2019   CRP 1.8 (H) 02/28/2023   REPTSTATUS 03/14/2023 FINAL 03/09/2023   GRAMSTAIN  03/09/2023    FEW WBC PRESENT, PREDOMINANTLY PMN FEW GRAM POSITIVE COCCI FEW GRAM NEGATIVE RODS FEW GRAM POSITIVE RODS FEW SQUAMOUS EPITHELIAL CELLS PRESENT    CULT  03/09/2023    MODERATE PROTEUS PENNERI RARE ENTEROCOCCUS FAECALIS MODERATE DIPHTHEROIDS(CORYNEBACTERIUM SPECIES) Standardized  susceptibility testing for this organism is not available. NO ANAEROBES ISOLATED Performed at [facility], [address] [place], [place] [address]    LABORGA PROTEUS PENNERI 03/09/2023   LABORGA ENTEROCOCCUS FAECALIS 03/09/2023     Lab Results  Component Value Date   ALBUMIN 3.1 (L) 02/27/2023   ALBUMIN 2.2 (L) 12/13/2021   ALBUMIN 2.0 (L) 12/12/2021    Lab Results  Component Value Date   MG 2.1 12/16/2021   MG 2.0 12/14/2021   MG 2.1 12/12/2021   No results found for: "VD25OH"  No results found for: "PREALBUMIN"    Latest Ref Rng & Units 03/12/2023    1:57 AM 03/11/2023    1:33 AM 03/10/2023    1:11 AM  CBC EXTENDED  WBC 4.0 - 10.5 K/uL 7.9  8.1  9.1   RBC 4.22 - 5.81 MIL/uL 3.31  2.89  2.82   Hemoglobin 13.0 - 17.0 g/dL 9.1  8.1  7.9   HCT 82.9 - 52.0 % 29.3  25.5  24.8   Platelets 150 - 400 K/uL 151  150  145      There is no height or weight on file to calculate BMI.  Orders:  No orders of the defined types were placed in this encounter.  No orders of the  defined types were placed in this encounter.    Procedures: No procedures performed  Clinical Data: No additional findings.  ROS:  All other systems negative, except as noted in the HPI. Review of Systems  Objective: Vital Signs: There were no vitals taken for this visit.  Specialty Comments:  No specialty comments available.  PMFS History: Patient Active Problem List   Diagnosis Date Noted   Gangrene of left foot (HCC) 03/06/2023   Osteomyelitis of foot, left, acute (HCC) 03/06/2023   Diabetic neuropathy (HCC) 02/28/2023   Open wound of left foot 01/30/2022   Elevated TSH 12/12/2021   Mixed hyperlipidemia 12/07/2021   Stage 3a chronic kidney disease (CKD) (HCC) 12/07/2021   Congestive heart failure (HCC) 10/20/2021   COPD (chronic obstructive pulmonary disease) (HCC) 07/05/2021   Cirrhosis of liver (HCC) 10/18/2019   PVD (peripheral vascular disease) (HCC) 08/11/2019    Pancytopenia (HCC) 05/29/2019   Type 2 diabetes mellitus with circulatory disorder (HCC) 05/29/2019   Critical lower limb ischemia (HCC) 04/22/2019   Past Medical History:  Diagnosis Date   Acute on chronic renal failure (HCC)    Essential hypertension    GERD (gastroesophageal reflux disease)    Hyperosmolar syndrome 2020   Hypothyroidism    Mild coronary artery disease    Cardiac catheterization September 2022 - [facility]   Nonischemic cardiomyopathy [facility])    PAD (peripheral artery disease) (HCC)    Thrombocytopenia (HCC)    Type 2 diabetes mellitus (HCC)     Family History  Problem Relation Age of Onset   Alcohol abuse Father    Cancer Mother    Alcohol abuse Brother    Bipolar disorder Son    Bipolar disorder Daughter     Past Surgical History:  Procedure Laterality Date   ABDOMINAL AORTOGRAM W/LOWER EXTREMITY N/A 02/06/2019   Procedure: ABDOMINAL AORTOGRAM W/LOWER EXTREMITY;  Surgeon: Cephus Shelling, MD;  Location: [facility]VASIVE [facility] LAB;  Service: Cardiovascular;  Laterality: N/A;  bilateral   ABDOMINAL AORTOGRAM W/LOWER EXTREMITY N/A 04/30/2019   Procedure: ABDOMINAL AORTOGRAM W/LOWER EXTREMITY;  Surgeon: Cephus Shelling, MD;  Location: [facility];  Service: Cardiovascular;  Laterality: N/A;   ABDOMINAL AORTOGRAM W/LOWER EXTREMITY N/A 02/28/2023   Procedure: ABDOMINAL AORTOGRAM W/LOWER EXTREMITY;  Surgeon: Cephus Shelling, MD;  Location: [facility];  Service: Cardiovascular;  Laterality: N/A;   AMPUTATION TOE Left 03/05/2023   Procedure: AMPUTATION LEFT SECOND TOE;  Surgeon: Cephus Shelling, MD;  Location: [facility] OR;  Service: Vascular;  Laterality: Left;   AORTOGRAM  08/11/2019   Procedure: Aortogram;  Surgeon: Cephus Shelling, MD;  Location: [facility] OR;  Service: Vascular;;   ENDARTERECTOMY FEMORAL Left 03/05/2023   Procedure: LEFT COMMON FEMORAL ENDARTERECTOMY WITH PROFUNDOPLASTY AND PATCH ANGIOPLASTY;  Surgeon: Cephus Shelling, MD;  Location: [facility]  OR;  Service: Vascular;  Laterality: Left;   ESOPHAGEAL BANDING  08/22/2019   Procedure: ESOPHAGEAL BANDING;  Surgeon: Rachael Fee, MD;  Location: [facility] ENDOSCOPY;  Service: Endoscopy;;   ESOPHAGOGASTRODUODENOSCOPY N/A 08/22/2019   Procedure: ESOPHAGOGASTRODUODENOSCOPY (EGD);  Surgeon: Rachael Fee, MD;  Location: [facility] ENDOSCOPY;  Service: Endoscopy;  Laterality: N/A;   FEMORAL-POPLITEAL BYPASS GRAFT Left 08/11/2019   Procedure: BYPASS LEFT FEMORAL-DISTAL POPLITEAL ARTERY USING PROPATEN GRAFT;  Surgeon: Cephus Shelling, MD;  Location: [facility] OR;  Service: Vascular;  Laterality: Left;   FEMORAL-TIBIAL BYPASS GRAFT Left 03/05/2023   Procedure: LEFT COMMON FEMORAL ARTERY TO POSTERIOR TIBIAL ARTERY BYPASS USING PTFE;  Surgeon: Chestine Spore,  Canary Brim, MD;  Location: [facility] OR;  Service: Vascular;  Laterality: Left;   I & D EXTREMITY Left 03/09/2023   Procedure: LEFT HEEL DEBRIDEMENT;  Surgeon: [name], MD;  Location: [facility] OR;  Service: Orthopedics;  Laterality: Left;   INSERTION OF DIALYSIS CATHETER Right 09/09/2019   Procedure: INSERTION OF DIALYSIS CATHETER;  Surgeon: Maeola Harman, MD;  Location: [facility] OR;  Service: Vascular;  Laterality: Right;   IRRIGATION AND DEBRIDEMENT FOOT Left 03/05/2023   Procedure: IRRIGATION AND DEBRIDEMENT LEFT HEEL WOUND;  Surgeon: Cephus Shelling, MD;  Location: [facility] OR;  Service: Vascular;  Laterality: Left;   MULTIPLE TOOTH EXTRACTIONS     PERIPHERAL VASCULAR INTERVENTION  02/06/2019   Procedure: PERIPHERAL VASCULAR INTERVENTION;  Surgeon: Cephus Shelling, MD;  Location: [facility];  Service: Cardiovascular;;  Bilateral Iliacs   PERIPHERAL VASCULAR INTERVENTION  04/30/2019   Procedure: PERIPHERAL VASCULAR INTERVENTION;  Surgeon: Cephus Shelling, MD;  Location: [facility];  Service: Cardiovascular;;  Stent - Lt. Iliac    REMOVAL OF A DIALYSIS CATHETER Right 09/09/2019   Procedure: Removal Of A Dialysis Catheter;  Surgeon: Maeola Harman, MD;  Location: [facility] OR;  Service: Vascular;  Laterality: Right;   RIGHT HEART CATH N/A 12/12/2021   Procedure: RIGHT HEART CATH;  Surgeon: Laurey Morale, MD;  Location: [facility] INVASIVE CV LAB;  Service: Cardiovascular;  Laterality: N/A;   ULTRASOUND GUIDANCE FOR VASCULAR ACCESS Right 09/09/2019   Procedure: Ultrasound Guidance For Vascular Access;  Surgeon: Maeola Harman, MD;  Location: [facility] OR;  Service: Vascular;  Laterality: Right;   VASCULAR SURGERY     Social History   Occupational History   Not on file  Tobacco Use   Smoking status: Former    Packs/day: 1.00    Years: 36.00    Additional pack years: 0.00    Total pack years: 36.00    Types: Cigarettes    Quit date: 01/25/2019    Years since quitting: 4.1    Passive exposure: Never   Smokeless tobacco: Never  Vaping Use   Vaping Use: Never used  Substance and Sexual Activity   Alcohol use: Not Currently    Comment: per pt's wife, pt is an alcoholic but just recently stopped drinking in 04/2019   Drug use: Never   Sexual activity: Not on file

## 2023-03-22 NOTE — Telephone Encounter (Signed)
I called Edward Rocha [phone] found that the [facility] is heather and he contact number [phone]. Advised that the dry dressing was removed today in the office and we applied a disposable wound vac. This will remain in place until next Thursday. Nursing on hold until after that appt and then I will call with updated orders.

## 2023-03-28 ENCOUNTER — Encounter: Payer: Medicare Other | Admitting: Infectious Diseases

## 2023-03-29 ENCOUNTER — Encounter: Payer: Medicare Other | Admitting: Orthopedic Surgery

## 2023-04-02 ENCOUNTER — Encounter: Payer: Self-pay | Admitting: Orthopedic Surgery

## 2023-04-02 ENCOUNTER — Ambulatory Visit (INDEPENDENT_AMBULATORY_CARE_PROVIDER_SITE_OTHER): Payer: Medicare Other | Admitting: Orthopedic Surgery

## 2023-04-02 DIAGNOSIS — L97424 Non-pressure chronic ulcer of left heel and midfoot with necrosis of bone: Secondary | ICD-10-CM

## 2023-04-02 NOTE — Progress Notes (Signed)
Office Visit Note   Patient: Edward Rocha.           Date of Birth: Jan 11, 1966           MRN: [id number] Visit Date: 04/02/2023              Requested by: Shelby Dubin, FNP [address] [place],  [place] [address] PCP: Shelby Dubin, FNP  Chief Complaint  Patient presents with   Left Foot - Routine Post Op    03/09/2023 left heel debridement 38 micro and 7x10 sheet with NEW VAC SPONGE      HPI: Patient is a 57 year old gentleman who presents in follow-up status post revascularization to the left lower extremity.  Patient has undergone second toe amputation with vascular surgery and I proceeded with excision of the necrotic heel ulcer with excision of bone and application of Kerecis tissue graft.  Patient had a appointment scheduled last week to remove the VAC.  Patient canceled the surgery and discontinue the wound VAC on his own leaving the sponge against the wound this was removed today.  Assessment & Plan: Visit Diagnoses:  1. Heel ulcer, left, with necrosis of bone (HCC)     Plan: Patient will continue with the [facility] continue with elevation.  We will remove the sutures from the second toe.  We will set up home health nursing for dressing changes with silver alginate dressing 2-3 times a week.  Follow-Up Instructions: No follow-ups on file.   Ortho Exam  Patient is alert, oriented, no adenopathy, well-dressed, normal affect, normal respiratory effort. Examination the hindfoot is macerated and swollen.  This was debrided there was good healthy granulation tissue in the wound bed no exposed bone.  The second toe amputation is healed sutures are removed.  Imaging: No results found.     Labs: Lab Results  Component Value Date   HGBA1C 6.5 (H) 02/27/2023   HGBA1C 6.9 (H) 12/07/2021   HGBA1C 5.3 10/18/2019   ESRSEDRATE 62 (H) 02/28/2023   ESRSEDRATE 85 (H) 05/29/2019   CRP 1.8 (H) 02/28/2023   REPTSTATUS 03/14/2023 FINAL 03/09/2023   GRAMSTAIN  03/09/2023    FEW WBC  PRESENT, PREDOMINANTLY PMN FEW GRAM POSITIVE COCCI FEW GRAM NEGATIVE RODS FEW GRAM POSITIVE RODS FEW SQUAMOUS EPITHELIAL CELLS PRESENT    CULT  03/09/2023    MODERATE PROTEUS PENNERI RARE ENTEROCOCCUS FAECALIS MODERATE DIPHTHEROIDS(CORYNEBACTERIUM SPECIES) Standardized susceptibility testing for this organism is not available. NO ANAEROBES ISOLATED Performed at [facility], [address], [place], [place] [address]    LABORGA PROTEUS PENNERI 03/09/2023   LABORGA ENTEROCOCCUS FAECALIS 03/09/2023     Lab Results  Component Value Date   ALBUMIN 3.1 (L) 02/27/2023   ALBUMIN 2.2 (L) 12/13/2021   ALBUMIN 2.0 (L) 12/12/2021    Lab Results  Component Value Date   MG 2.1 12/16/2021   MG 2.0 12/14/2021   MG 2.1 12/12/2021   No results found for: "VD25OH"  No results found for: "PREALBUMIN"    Latest Ref Rng & Units 03/12/2023    1:57 AM 03/11/2023    1:33 AM 03/10/2023    1:11 AM  CBC EXTENDED  WBC 4.0 - 10.5 K/uL 7.9  8.1  9.1   RBC 4.22 - 5.81 MIL/uL 3.31  2.89  2.82   Hemoglobin 13.0 - 17.0 g/dL 9.1  8.1  7.9   HCT 82.9 - 52.0 % 29.3  25.5  24.8   Platelets 150 - 400  K/uL 151  150  145      There is no height or weight on file to calculate BMI.  Orders:  No orders of the defined types were placed in this encounter.  No orders of the defined types were placed in this encounter.    Procedures: No procedures performed  Clinical Data: No additional findings.  ROS:  All other systems negative, except as noted in the HPI. Review of Systems  Objective: Vital Signs: There were no vitals taken for this visit.  Specialty Comments:  No specialty comments available.  PMFS History: Patient Active Problem List   Diagnosis Date Noted   Gangrene of left foot (HCC) 03/06/2023   Osteomyelitis of foot, left, acute (HCC) 03/06/2023   Diabetic neuropathy (HCC) 02/28/2023   Open wound of left foot 01/30/2022   Elevated TSH 12/12/2021   Mixed hyperlipidemia  12/07/2021   Stage 3a chronic kidney disease (CKD) (HCC) 12/07/2021   Congestive heart failure (HCC) 10/20/2021   COPD (chronic obstructive pulmonary disease) (HCC) 07/05/2021   Cirrhosis of liver (HCC) 10/18/2019   PVD (peripheral vascular disease) (HCC) 08/11/2019   Pancytopenia (HCC) 05/29/2019   Type 2 diabetes mellitus with circulatory disorder (HCC) 05/29/2019   Critical lower limb ischemia (HCC) 04/22/2019   Past Medical History:  Diagnosis Date   Acute on chronic renal failure (HCC)    Essential hypertension    GERD (gastroesophageal reflux disease)    Hyperosmolar syndrome 2020   Hypothyroidism    Mild coronary artery disease    Cardiac catheterization September 2022 - [facility]   Nonischemic cardiomyopathy [facility])    PAD (peripheral artery disease) (HCC)    Thrombocytopenia (HCC)    Type 2 diabetes mellitus (HCC)     Family History  Problem Relation Age of Onset   Alcohol abuse Father    Cancer Mother    Alcohol abuse Brother    Bipolar disorder Son    Bipolar disorder Daughter     Past Surgical History:  Procedure Laterality Date   ABDOMINAL AORTOGRAM W/LOWER EXTREMITY N/A 02/06/2019   Procedure: ABDOMINAL AORTOGRAM W/LOWER EXTREMITY;  Surgeon: Cephus Shelling, MD;  [facility]: [facility] INVASIVE CV LAB;  Service: Cardiovascular;  Laterality: N/A;  bilateral   ABDOMINAL AORTOGRAM W/LOWER EXTREMITY N/A 04/30/2019   Procedure: ABDOMINAL AORTOGRAM W/LOWER EXTREMITY;  Surgeon: Cephus Shelling, MD;  [facility];  Service: Cardiovascular;  Laterality: N/A;   ABDOMINAL AORTOGRAM W/LOWER EXTREMITY N/A 02/28/2023   Procedure: ABDOMINAL AORTOGRAM W/LOWER EXTREMITY;  Surgeon: Cephus Shelling, MD;  [facility];  Service: Cardiovascular;  Laterality: N/A;   AMPUTATION TOE Left 03/05/2023   Procedure: AMPUTATION LEFT SECOND TOE;  Surgeon: Cephus Shelling, MD;  Location: [facility] OR;  Service: Vascular;  Laterality: Left;   AORTOGRAM  08/11/2019    Procedure: Aortogram;  Surgeon: Cephus Shelling, MD;  Location: [facility] OR;  Service: Vascular;;   ENDARTERECTOMY FEMORAL Left 03/05/2023   Procedure: LEFT COMMON FEMORAL ENDARTERECTOMY WITH PROFUNDOPLASTY AND PATCH ANGIOPLASTY;  Surgeon: Cephus Shelling, MD;  Location: [facility] OR;  Service: Vascular;  Laterality: Left;   ESOPHAGEAL BANDING  08/22/2019   Procedure: ESOPHAGEAL BANDING;  Surgeon: Rachael Fee, MD;  Location: [facility] ENDOSCOPY;  Service: Endoscopy;;   ESOPHAGOGASTRODUODENOSCOPY N/A 08/22/2019   Procedure: ESOPHAGOGASTRODUODENOSCOPY (EGD);  Surgeon: Rachael Fee, MD;  Location: [facility] ENDOSCOPY;  Service: Endoscopy;  Laterality: N/A;   FEMORAL-POPLITEAL BYPASS GRAFT Left 08/11/2019   Procedure: BYPASS LEFT FEMORAL-DISTAL POPLITEAL ARTERY USING PROPATEN  GRAFT;  Surgeon: Cephus Shelling, MD;  Location: [facility] OR;  Service: Vascular;  Laterality: Left;   FEMORAL-TIBIAL BYPASS GRAFT Left 03/05/2023   Procedure: LEFT COMMON FEMORAL ARTERY TO POSTERIOR TIBIAL ARTERY BYPASS USING PTFE;  Surgeon: Cephus Shelling, MD;  Location: [facility] OR;  Service: Vascular;  Laterality: Left;   I & D EXTREMITY Left 03/09/2023   Procedure: LEFT HEEL DEBRIDEMENT;  Surgeon: [name], MD;  Location: [facility] OR;  Service: Orthopedics;  Laterality: Left;   INSERTION OF DIALYSIS CATHETER Right 09/09/2019   Procedure: INSERTION OF DIALYSIS CATHETER;  Surgeon: Maeola Harman, MD;  Location: [facility] OR;  Service: Vascular;  Laterality: Right;   IRRIGATION AND DEBRIDEMENT FOOT Left 03/05/2023   Procedure: IRRIGATION AND DEBRIDEMENT LEFT HEEL WOUND;  Surgeon: Cephus Shelling, MD;  Location: [facility] OR;  Service: Vascular;  Laterality: Left;   MULTIPLE TOOTH EXTRACTIONS     PERIPHERAL VASCULAR INTERVENTION  02/06/2019   Procedure: PERIPHERAL VASCULAR INTERVENTION;  Surgeon: Cephus Shelling, MD;  [facility];  Service: Cardiovascular;;  Bilateral Iliacs   PERIPHERAL VASCULAR INTERVENTION  04/30/2019    Procedure: PERIPHERAL VASCULAR INTERVENTION;  Surgeon: Cephus Shelling, MD;  [facility];  Service: Cardiovascular;;  Stent - Lt. Iliac    REMOVAL OF A DIALYSIS CATHETER Right 09/09/2019   Procedure: Removal Of A Dialysis Catheter;  Surgeon: Maeola Harman, MD;  Location: [facility] OR;  Service: Vascular;  Laterality: Right;   RIGHT HEART CATH N/A 12/12/2021   Procedure: RIGHT HEART CATH;  Surgeon: Laurey Morale, MD;  Location: [facility] INVASIVE CV LAB;  Service: Cardiovascular;  Laterality: N/A;   ULTRASOUND GUIDANCE FOR VASCULAR ACCESS Right 09/09/2019   Procedure: Ultrasound Guidance For Vascular Access;  Surgeon: Maeola Harman, MD;  Location: [facility] OR;  Service: Vascular;  Laterality: Right;   VASCULAR SURGERY     Social History   Occupational History   Not on file  Tobacco Use   Smoking status: Former    Packs/day: 1.00    Years: 36.00    Additional pack years: 0.00    Total pack years: 36.00    Types: Cigarettes    Quit date: 01/25/2019    Years since quitting: 4.1    Passive exposure: Never   Smokeless tobacco: Never  Vaping Use   Vaping Use: Never used  Substance and Sexual Activity   Alcohol use: Not Currently    Comment: per pt's wife, pt is an alcoholic but just recently stopped drinking in 04/2019   Drug use: Never   Sexual activity: Not on file

## 2023-04-04 ENCOUNTER — Encounter: Payer: Medicare Other | Admitting: Infectious Diseases

## 2023-04-05 ENCOUNTER — Telehealth: Payer: Self-pay

## 2023-04-05 NOTE — Telephone Encounter (Signed)
I called and sw Krisit at [facility] [phone] gave verbal orders for silver alginate, 4 x 4 , kelrix and ace from toes to knee. PRAFO boot and NWTB encourage elevation chest level. I called and lm on vm for the pt to advise of this as well and to call with any questions. Did advise that they will have to change the dressing in between so if they have not already should remove dressing and reapply a dry dressing.

## 2023-04-05 NOTE — Telephone Encounter (Signed)
Patient's brother Casimiro Needle called concerning HH for patient.  Stated that no one has came by to change dressing. CB# [phone]. Please advise.  Thank you.

## 2023-04-09 ENCOUNTER — Ambulatory Visit (INDEPENDENT_AMBULATORY_CARE_PROVIDER_SITE_OTHER): Payer: Medicare Other | Admitting: Orthopedic Surgery

## 2023-04-09 ENCOUNTER — Encounter: Payer: Self-pay | Admitting: Orthopedic Surgery

## 2023-04-09 DIAGNOSIS — I96 Gangrene, not elsewhere classified: Secondary | ICD-10-CM

## 2023-04-09 DIAGNOSIS — M67471 Ganglion, right ankle and foot: Secondary | ICD-10-CM

## 2023-04-09 MED ORDER — OXYCODONE-ACETAMINOPHEN 5-325 MG PO TABS: 1.0000 | ORAL_TABLET | ORAL | 0 refills | Status: DC | PRN

## 2023-04-09 NOTE — Progress Notes (Signed)
Office Visit Note   Patient: Edward Rocha.           Date of Birth: 1966-01-13           MRN: [id number] Visit Date: 04/09/2023              Requested by: Shelby Dubin, FNP [address] [place],  [place] [address] PCP: Shelby Dubin, FNP  Chief Complaint  Patient presents with   Left Foot - Routine Post Op    03/09/2023 left heel debridement      HPI: Patient is a 57 year old gentleman is seen in follow-up status post foot salvage intervention on the left with revascularization, amputation of the second toe, and debridement and tissue grafting for the left Achilles wound.  Patient reports increasing pain increasing necrotic changes.  Assessment & Plan: Visit Diagnoses:  1. Gangrene of left foot (HCC)     Plan: Will plan for a left below-knee amputation.  Patient may need discharge to skilled nursing.  Prescription for Percocet provided.  Follow-Up Instructions: Return in about 2 weeks (around 04/23/2023).   Ortho Exam  Patient is alert, oriented, no adenopathy, well-dressed, normal affect, normal respiratory effort. Examination patient's left foot is cold to the touch there is increased swelling.  There is wound necrosis and breakdown of the second toe amputation.  There is complete wound breakdown and necrosis of the Achilles debridement and soft tissue reinforcement.  The Doppler was used and venous flow can be heard over the dorsalis pedis and posterior tibial artery however I cannot hear a arterial flow.  Imaging: No results found.    Labs: Lab Results  Component Value Date   HGBA1C 6.5 (H) 02/27/2023   HGBA1C 6.9 (H) 12/07/2021   HGBA1C 5.3 10/18/2019   ESRSEDRATE 62 (H) 02/28/2023   ESRSEDRATE 85 (H) 05/29/2019   CRP 1.8 (H) 02/28/2023   REPTSTATUS 03/14/2023 FINAL 03/09/2023   GRAMSTAIN  03/09/2023    FEW WBC PRESENT, PREDOMINANTLY PMN FEW GRAM POSITIVE COCCI FEW GRAM NEGATIVE RODS FEW GRAM POSITIVE RODS FEW SQUAMOUS EPITHELIAL CELLS PRESENT    CULT   03/09/2023    MODERATE PROTEUS PENNERI RARE ENTEROCOCCUS FAECALIS MODERATE DIPHTHEROIDS(CORYNEBACTERIUM SPECIES) Standardized susceptibility testing for this organism is not available. NO ANAEROBES ISOLATED Performed at [facility], [address] [place], [place] [address]    LABORGA PROTEUS PENNERI 03/09/2023   LABORGA ENTEROCOCCUS FAECALIS 03/09/2023     Lab Results  Component Value Date   ALBUMIN 3.1 (L) 02/27/2023   ALBUMIN 2.2 (L) 12/13/2021   ALBUMIN 2.0 (L) 12/12/2021    Lab Results  Component Value Date   MG 2.1 12/16/2021   MG 2.0 12/14/2021   MG 2.1 12/12/2021   No results found for: "VD25OH"  No results found for: "PREALBUMIN"    Latest Ref Rng & Units 03/12/2023    1:57 AM 03/11/2023    1:33 AM 03/10/2023    1:11 AM  CBC EXTENDED  WBC 4.0 - 10.5 K/uL 7.9  8.1  9.1   RBC 4.22 - 5.81 MIL/uL 3.31  2.89  2.82   Hemoglobin 13.0 - 17.0 g/dL 9.1  8.1  7.9   HCT 82.9 - 52.0 % 29.3  25.5  24.8   Platelets 150 - 400 K/uL 151  150  145      There is no height or weight on file to calculate BMI.  Orders:  No orders of the defined types were placed in this  encounter.  Meds ordered this encounter  Medications   oxyCODONE-acetaminophen (PERCOCET/ROXICET) 5-325 MG tablet    Sig: Take 1 tablet by mouth every 4 (four) hours as needed for severe pain.    Dispense:  30 tablet    Refill:  0     Procedures: No procedures performed  Clinical Data: No additional findings.  ROS:  All other systems negative, except as noted in the HPI. Review of Systems  Objective: Vital Signs: There were no vitals taken for this visit.  Specialty Comments:  No specialty comments available.  PMFS History: Patient Active Problem List   Diagnosis Date Noted   Gangrene of left foot (HCC) 03/06/2023   Osteomyelitis of foot, left, acute (HCC) 03/06/2023   Diabetic neuropathy (HCC) 02/28/2023   Open wound of left foot 01/30/2022   Elevated TSH 12/12/2021   Mixed  hyperlipidemia 12/07/2021   Stage 3a chronic kidney disease (CKD) (HCC) 12/07/2021   Congestive heart failure (HCC) 10/20/2021   COPD (chronic obstructive pulmonary disease) (HCC) 07/05/2021   Cirrhosis of liver (HCC) 10/18/2019   PVD (peripheral vascular disease) (HCC) 08/11/2019   Pancytopenia (HCC) 05/29/2019   Type 2 diabetes mellitus with circulatory disorder (HCC) 05/29/2019   Critical lower limb ischemia (HCC) 04/22/2019   Past Medical History:  Diagnosis Date   Acute on chronic renal failure (HCC)    COPD (chronic obstructive pulmonary disease) (HCC)    Essential hypertension    GERD (gastroesophageal reflux disease)    Hyperosmolar syndrome 2020   Hypothyroidism    Mild coronary artery disease    Cardiac catheterization September 2022 - [facility]   Nonischemic cardiomyopathy [facility])    PAD (peripheral artery disease) (HCC)    Thrombocytopenia (HCC)    Type 2 diabetes mellitus (HCC)     Family History  Problem Relation Age of Onset   Alcohol abuse Father    Cancer Mother    Alcohol abuse Brother    Bipolar disorder Son    Bipolar disorder Daughter     Past Surgical History:  Procedure Laterality Date   ABDOMINAL AORTOGRAM W/LOWER EXTREMITY N/A 02/06/2019   Procedure: ABDOMINAL AORTOGRAM W/LOWER EXTREMITY;  Surgeon: Cephus Shelling, MD;  Location: [facility] LAB;  Service: Cardiovascular;  Laterality: N/A;  bilateral   ABDOMINAL AORTOGRAM W/LOWER EXTREMITY N/A 04/30/2019   Procedure: ABDOMINAL AORTOGRAM W/LOWER EXTREMITY;  Surgeon: Cephus Shelling, MD;  Location: [facility];  Service: Cardiovascular;  Laterality: N/A;   ABDOMINAL AORTOGRAM W/LOWER EXTREMITY N/A 02/28/2023   Procedure: ABDOMINAL AORTOGRAM W/LOWER EXTREMITY;  Surgeon: Cephus Shelling, MD;  [facility];  Service: Cardiovascular;  Laterality: N/A;   AMPUTATION TOE Left 03/05/2023   Procedure: AMPUTATION LEFT SECOND TOE;  Surgeon: Cephus Shelling, MD;  Location:  [facility] OR;  Service: Vascular;  Laterality: Left;   AORTOGRAM  08/11/2019   Procedure: Aortogram;  Surgeon: Cephus Shelling, MD;  Location: [facility] OR;  Service: Vascular;;   CARDIAC CATHETERIZATION     ENDARTERECTOMY FEMORAL Left 03/05/2023   Procedure: LEFT COMMON FEMORAL ENDARTERECTOMY WITH PROFUNDOPLASTY AND PATCH ANGIOPLASTY;  Surgeon: Cephus Shelling, MD;  Location: [facility] OR;  Service: Vascular;  Laterality: Left;   ESOPHAGEAL BANDING  08/22/2019   Procedure: ESOPHAGEAL BANDING;  Surgeon: Rachael Fee, MD;  Location: [facility] ENDOSCOPY;  Service: Endoscopy;;   ESOPHAGOGASTRODUODENOSCOPY N/A 08/22/2019   Procedure: ESOPHAGOGASTRODUODENOSCOPY (EGD);  Surgeon: Rachael Fee, MD;  Location: [facility] ENDOSCOPY;  Service: Endoscopy;  Laterality: N/A;   FEMORAL-POPLITEAL  BYPASS GRAFT Left 08/11/2019   Procedure: BYPASS LEFT FEMORAL-DISTAL POPLITEAL ARTERY USING PROPATEN GRAFT;  Surgeon: Cephus Shelling, MD;  Location: [facility] OR;  Service: Vascular;  Laterality: Left;   FEMORAL-TIBIAL BYPASS GRAFT Left 03/05/2023   Procedure: LEFT COMMON FEMORAL ARTERY TO POSTERIOR TIBIAL ARTERY BYPASS USING PTFE;  Surgeon: Cephus Shelling, MD;  Location: [facility] OR;  Service: Vascular;  Laterality: Left;   I & D EXTREMITY Left 03/09/2023   Procedure: LEFT HEEL DEBRIDEMENT;  Surgeon: [name], MD;  Location: [facility] OR;  Service: Orthopedics;  Laterality: Left;   INSERTION OF DIALYSIS CATHETER Right 09/09/2019   Procedure: INSERTION OF DIALYSIS CATHETER;  Surgeon: Maeola Harman, MD;  Location: [facility] OR;  Service: Vascular;  Laterality: Right;   IRRIGATION AND DEBRIDEMENT FOOT Left 03/05/2023   Procedure: IRRIGATION AND DEBRIDEMENT LEFT HEEL WOUND;  Surgeon: Cephus Shelling, MD;  Location: [facility] OR;  Service: Vascular;  Laterality: Left;   MULTIPLE TOOTH EXTRACTIONS     PERIPHERAL VASCULAR INTERVENTION  02/06/2019   Procedure: PERIPHERAL VASCULAR INTERVENTION;  Surgeon: Cephus Shelling, MD;  [facility];  Service: Cardiovascular;;  Bilateral Iliacs   PERIPHERAL VASCULAR INTERVENTION  04/30/2019   Procedure: PERIPHERAL VASCULAR INTERVENTION;  Surgeon: Cephus Shelling, MD;  [facility];  Service: Cardiovascular;;  Stent - Lt. Iliac    REMOVAL OF A DIALYSIS CATHETER Right 09/09/2019   Procedure: Removal Of A Dialysis Catheter;  Surgeon: Maeola Harman, MD;  Location: [facility] OR;  Service: Vascular;  Laterality: Right;   RIGHT HEART CATH N/A 12/12/2021   Procedure: RIGHT HEART CATH;  Surgeon: Laurey Morale, MD;  Location: [facility] INVASIVE CV LAB;  Service: Cardiovascular;  Laterality: N/A;   ULTRASOUND GUIDANCE FOR VASCULAR ACCESS Right 09/09/2019   Procedure: Ultrasound Guidance For Vascular Access;  Surgeon: Maeola Harman, MD;  Location: [facility] OR;  Service: Vascular;  Laterality: Right;   VASCULAR SURGERY     Social History   Occupational History   Not on file  Tobacco Use   Smoking status: Former    Packs/day: 1.00    Years: 36.00    Additional pack years: 0.00    Total pack years: 36.00    Types: Cigarettes    Quit date: 01/25/2019    Years since quitting: 4.2    Passive exposure: Never   Smokeless tobacco: Never  Vaping Use   Vaping Use: Never used  Substance and Sexual Activity   Alcohol use: Not Currently    Comment: per pt's wife, pt is an alcoholic but just recently stopped drinking in 04/2019   Drug use: Never   Sexual activity: Not on file

## 2023-04-10 ENCOUNTER — Other Ambulatory Visit: Payer: Self-pay

## 2023-04-10 ENCOUNTER — Encounter (HOSPITAL_COMMUNITY): Payer: Self-pay | Admitting: Orthopedic Surgery

## 2023-04-10 NOTE — Progress Notes (Signed)
PCP - Colvin Caroli, FNP Cardiologist - Nona Dell, MD  PPM/ICD - denies  Chest x-Fluke - 02/27/23 EKG - 02/28/23 ECHO - 12/12/21 Cardiac Cath - 12/12/21  CPAP - n/a  Fasting Blood Sugar -  patient is checking CBG randomly Farxiga - last dose 04/09/23 per patient  Blood Thinner Instructions: Xarelto - last dose 04/09/23 per patient Aspirin Instructions - last dose - 04/09/23 per patient Patient was instructed: As of today, STOP taking any Aspirin (unless otherwise instructed by your surgeon) Aleve, Naproxen, Ibuprofen, Motrin, Advil, Goody's, BC's, all herbal medications, fish oil, and all vitamins.  ERAS Protcol - yes, until 07:00 o'clock  COVID TEST- n/a  Anesthesia review: yes  Patient verbally denies any shortness of breath, fever, cough and chest pain during phone call   -------------  SDW INSTRUCTIONS given:  Your procedure is scheduled on Wednesday, June 12th,2 024.  Report to [facility] Main Entrance "A" at 07:30 A.M., and check in at the Admitting office.  Call this number if you have problems the morning of surgery:  [phone]   Remember:  Do not eat after midnight the night before your surgery  You may drink clear liquids until 07:00 the morning of your surgery.   Clear liquids allowed are: Water, Non-Citrus Juices (without pulp), Carbonated Beverages, Clear Tea, Black Coffee Only, and Gatorade    Take these medicines the morning of surgery with A SIP OF WATER:  Lipitor, Ciprofloxacin, Gabapentin, Levaquin PRN: Tylenol, Oxycodone, Nitroglycerin  Hold Aspirin, Xarelto and Farxiga   How do I manage my blood sugar before surgery? Check your blood sugar at least 4 times a day, starting 2 days before surgery, to make sure that the level is not too high or low.  Check your blood sugar the morning of your surgery when you wake up and every 2 hours until you get to the Short Stay unit.  If your blood sugar is less than 70 mg/dL, you will need to treat for  low blood sugar: Do not take insulin. Treat a low blood sugar (less than 70 mg/dL) with  cup of clear juice (cranberry or apple), 4 glucose tablets, OR glucose gel. Recheck blood sugar in 15 minutes after treatment (to make sure it is greater than 70 mg/dL). If your blood sugar is not greater than 70 mg/dL on recheck, call [phone] for further instructions. Report your blood sugar to the short stay nurse when you get to Short Stay.  The day of surgery:                     Do not wear jewelry,             Do not wear lotions, powders, colognes, or deodorant.            Men may shave face and neck.            Do not bring valuables to the hospital.            [facility] is not responsible for any belongings or valuables.  Do NOT Smoke (Tobacco/Vaping) 24 hours prior to your procedure If you use a CPAP at night, you may bring all equipment for your overnight stay.   Contacts, glasses, dentures or bridgework may not be worn into surgery.      For patients admitted to the hospital, discharge time will be determined by your treatment team.   Patients discharged the day of surgery will not be allowed to drive  home, and someone needs to stay with them for 24 hours.    Special instructions:   [facility]- Preparing For Surgery  Before surgery, you can play an important role. Because skin is not sterile, your skin needs to be as free of germs as possible. You can reduce the number of germs on your skin by washing with CHG (chlorahexidine gluconate) Soap before surgery.  CHG is an antiseptic cleaner which kills germs and bonds with the skin to continue killing germs even after washing.    Oral Hygiene is also important to reduce your risk of infection.  Remember - BRUSH YOUR TEETH THE MORNING OF SURGERY WITH YOUR REGULAR TOOTHPASTE  Please do not use if you have an allergy to CHG or antibacterial soaps. If your skin becomes reddened/irritated stop using the CHG.  Do not shave (including  legs and underarms) for at least 48 hours prior to first CHG shower. It is OK to shave your face.  Please follow these instructions carefully.   Shower the NIGHT BEFORE SURGERY and the MORNING OF SURGERY with DIAL Soap.   Pat yourself dry with a CLEAN TOWEL.  Wear CLEAN PAJAMAS to bed the night before surgery  Place CLEAN SHEETS on your bed the night of your first shower and DO NOT SLEEP WITH PETS.   Day of Surgery: Please shower morning of surgery  Wear Clean/Comfortable clothing the morning of surgery Do not apply any deodorants/lotions.   Remember to brush your teeth WITH YOUR REGULAR TOOTHPASTE.   Questions were answered. Patient verbalized understanding of instructions.

## 2023-04-10 NOTE — Progress Notes (Signed)
Anesthesia Chart Review: Same day workup  57 year old male with pertinent history including NIDDM 2 (A1c 6.5%), treated hep C, hypothyroidism, nonischemic cardiomyopathy (EF 20-25%), ectopic atrial tachycardia, moderate pulmonary hypertension, peripheral vascular disease s/p L femoral endarterectomy and redo fem-tib bypass 02/2023, CKD stage IIIa, osteomyelitis of the left foot.  Recent admission 4/30 through 03/12/2023 for critical lower limb ischemia with left heel wound and calcaneal osteomyelitis.  He  presented to ED with 35-month history of left foot pain and left heel wound. On exam, patient had gangrenous wound over his left Achilles and gangrene of the left 2nd toe. Patient underwent angioplasty with stent placement of left common iliac artery on 5/1. Due to multilevel disease, he underwent left common femoral endarterectomy, redo femoral-tibial bypass, partial left second toe amputation, and debridement of left heel wound on 5/6. Vascular surgery concerned for wound infection intra-operatively, so cefepime, vancomycin, and Flagyl were started on 5/6. MRI of left foot showed subcortical marrow edema in the posterior calcaneus favored to be reactive marrow change vs. early osteomyelitis. Due to concern for calcaneal osteomyelitis, patient underwent partial excision of left calcaneus and excision of left heel skin/muscle fascia/Achilles tendon on 5/10. Calcaneal bone cultures grew Proteus penneri and Enterococcus faecalis. Patient started on levofloxacin 750 mg daily and Augmentin 875 mg BID based on culture sensitivities on 5/13. Cefepime, vancomycin, and Flagyl discontinued on 5/13.  Was discharged with a 5-week course of antibiotics as well as wound vac and PRAFO until follow-up with orthopedics. Patient also received medical therapy with Xarelto 2.5 mg BID for PAD, atorvastatin 80 mg, and aspirin 81 mg during hospitalization and will continue at discharge. VVS recommended ASA + plavix but per pharmacy  only xarelto and ASA needed.   Patient followed up with Dr. Due to on 04/09/23 and reported increasing pain and increasing necrotic changes.  Left BKA recommended.  Follows cardiology for history of nonobstructive CAD, chronic thrombocytopenia, nonischemic cardiomyopathy with HFrEF (EF 20 to 25%).  Left heart cath 07/06/2021 showed mild nonobstructive CAD.  Cardiac MR 12/16/2021 showed moderately dilated LV with EF 23%, diffuse hypokinesis, mildly dilated RV with EF 31%, diffuse delayed enhancement imaging.  Right heart cath 12/12/2021 showed severely elevated right and left heart filling pressures, low cardiac output by thermodilution, normal by Fick, I merrily pulmonary venous hypertension.  Echo 01/26/2022 showed severe LV dysfunction with EF 29%, normal RV systolic function, no significant valvular abnormalities.  BMP and CBC from 03/12/2023 reviewed, mild hyponatremia sodium 131, moderate anemia with hemoglobin 9.1, creatinine WNL at 1.19, otherwise unremarkable.  Patient will need day of surgery evaluation.  EKG 02/27/2023: Sinus tachycardia.  Rate 110.  Biatrial enlargement.  LVH with secondary repolarization abnormality.  Cardiac MR 12/16/2021: IMPRESSION: 1.  Moderately dilated LV with EF 23%, diffuse hypokinesis.   2.  Mildly dilated RV with EF 31%.   3. Diffuse delayed enhancement imaging. Suspect only nonspecific inferior RV insertion site LGE.   4. Mildly elevated extracellular volume percentage in the septum, nonspecific.  TTE 01/26/2022 (Care Everywhere):   SEVERE LV DYSFUNCTION (See above) calculated EF 29%   NORMAL RIGHT VENTRICULAR SYSTOLIC FUNCTION    VALVULAR REGURGITATION: TRIVIAL MR, TRIVIAL PR, MILD TR    NO VALVULAR STENOSIS    NO PRIOR STUDY FOR COMPARISON   Left heart cath 07/06/2021 (Care Everywhere): CONCLUSIONS:  1. Successful transradial cardiac catheterization  2. Mild nonobstructive coronary artery disease  3. Left ventricular solid pressure 33     Antionette Poles, PA-C [facility] Short Stay  Center/Anesthesiology Phone [phone] 04/10/2023 10:31 AM

## 2023-04-10 NOTE — Anesthesia Preprocedure Evaluation (Addendum)
Anesthesia Evaluation  Patient identified by MRN, date of birth, ID band Patient awake    Reviewed: Allergy & Precautions, NPO status , Patient's Chart, lab work & pertinent test results, reviewed documented beta blocker date and time   Airway Mallampati: I  TM Distance: >3 FB Neck ROM: Full    Dental  (+) Edentulous Upper, Edentulous Lower, Dental Advisory Given   Pulmonary COPD, former smoker   Pulmonary exam normal breath sounds clear to auscultation       Cardiovascular hypertension, Pt. on home beta blockers and Pt. on medications pulmonary hypertension+ CAD, + Peripheral Vascular Disease and +CHF  Normal cardiovascular exam Rhythm:Regular Rate:Normal  TTE 2023  1. Left ventricular ejection fraction, by estimation, is 20 to 25%. The  left ventricle has severely decreased function. The left ventricle  demonstrates global hypokinesis. There is moderate left ventricular  hypertrophy. Left ventricular diastolic  parameters are consistent with Grade II diastolic dysfunction  (pseudonormalization). Elevated left atrial pressure.   2. Right ventricular systolic function is mild to moderately reduced. The  right ventricular size is moderately enlarged. There is moderately  elevated pulmonary artery systolic pressure. The estimated right  ventricular systolic pressure is 51.7 mmHg.   3. Left atrial size was severely dilated.   4. Right atrial size was severely dilated.   5. A small pericardial effusion is present.   6. The mitral valve is normal in structure. Trivial mitral valve  regurgitation.   7. Tricuspid valve regurgitation is moderate.   8. The aortic valve is tricuspid. Aortic valve regurgitation is not  visualized. Aortic valve sclerosis/calcification is present, without any  evidence of aortic stenosis.   9. The inferior vena cava is dilated in size with <50% respiratory  variability, suggesting right atrial pressure of  15 mmHg.     Neuro/Psych negative neurological ROS  negative psych ROS   GI/Hepatic ,GERD  ,,(+) Cirrhosis         Endo/Other  diabetes, Type 2, Oral Hypoglycemic AgentsHypothyroidism    Renal/GU Renal disease  negative genitourinary   Musculoskeletal negative musculoskeletal ROS (+)    Abdominal   Peds  Hematology  (+) Blood dyscrasia (xarelto)   Anesthesia Other Findings   Reproductive/Obstetrics                             Anesthesia Physical Anesthesia Plan  ASA: 4  Anesthesia Plan: MAC and Regional   Post-op Pain Management: Regional block* and Tylenol PO (pre-op)*   Induction: Intravenous  PONV Risk Score and Plan: 1 and Propofol infusion, Treatment may vary due to age or medical condition, Midazolam and Ondansetron  Airway Management Planned: Natural Airway  Additional Equipment: ClearSight  Intra-op Plan:   Post-operative Plan:   Informed Consent: I have reviewed the patients History and Physical, chart, labs and discussed the procedure including the risks, benefits and alternatives for the proposed anesthesia with the patient or authorized representative who has indicated his/her understanding and acceptance.     Dental advisory given  Plan Discussed with: CRNA  Anesthesia Plan Comments: (  )        Anesthesia Quick Evaluation

## 2023-04-11 ENCOUNTER — Inpatient Hospital Stay (HOSPITAL_COMMUNITY)
Admission: RE | Admit: 2023-04-11 | Discharge: 2023-04-16 | DRG: 240 | Disposition: A | Discharge status: Skilled Nursing Facility | Payer: Medicare Other | Attending: Orthopedic Surgery | Admitting: Orthopedic Surgery

## 2023-04-11 ENCOUNTER — Other Ambulatory Visit: Payer: Self-pay

## 2023-04-11 ENCOUNTER — Encounter (HOSPITAL_COMMUNITY)
Admission: RE | Disposition: A | Discharge status: Skilled Nursing Facility | Payer: Self-pay | Source: Home / Self Care | Attending: Orthopedic Surgery

## 2023-04-11 ENCOUNTER — Encounter (HOSPITAL_COMMUNITY): Payer: Self-pay | Admitting: Orthopedic Surgery

## 2023-04-11 ENCOUNTER — Inpatient Hospital Stay (HOSPITAL_COMMUNITY): Payer: Medicare Other | Admitting: Physician Assistant

## 2023-04-11 DIAGNOSIS — I428 Other cardiomyopathies: Secondary | ICD-10-CM | POA: Diagnosis present

## 2023-04-11 DIAGNOSIS — K219 Gastro-esophageal reflux disease without esophagitis: Secondary | ICD-10-CM | POA: Diagnosis present

## 2023-04-11 DIAGNOSIS — Z87891 Personal history of nicotine dependence: Secondary | ICD-10-CM

## 2023-04-11 DIAGNOSIS — D696 Thrombocytopenia, unspecified: Secondary | ICD-10-CM | POA: Diagnosis present

## 2023-04-11 DIAGNOSIS — M86172 Other acute osteomyelitis, left ankle and foot: Secondary | ICD-10-CM | POA: Diagnosis not present

## 2023-04-11 DIAGNOSIS — E1122 Type 2 diabetes mellitus with diabetic chronic kidney disease: Secondary | ICD-10-CM | POA: Diagnosis present

## 2023-04-11 DIAGNOSIS — Z7982 Long term (current) use of aspirin: Secondary | ICD-10-CM

## 2023-04-11 DIAGNOSIS — Z89512 Acquired absence of left leg below knee: Secondary | ICD-10-CM

## 2023-04-11 DIAGNOSIS — I129 Hypertensive chronic kidney disease with stage 1 through stage 4 chronic kidney disease, or unspecified chronic kidney disease: Secondary | ICD-10-CM | POA: Diagnosis present

## 2023-04-11 DIAGNOSIS — D631 Anemia in chronic kidney disease: Secondary | ICD-10-CM | POA: Diagnosis present

## 2023-04-11 DIAGNOSIS — N1831 Chronic kidney disease, stage 3a: Secondary | ICD-10-CM | POA: Diagnosis present

## 2023-04-11 DIAGNOSIS — E039 Hypothyroidism, unspecified: Secondary | ICD-10-CM | POA: Diagnosis present

## 2023-04-11 DIAGNOSIS — J449 Chronic obstructive pulmonary disease, unspecified: Secondary | ICD-10-CM | POA: Diagnosis not present

## 2023-04-11 DIAGNOSIS — I509 Heart failure, unspecified: Secondary | ICD-10-CM

## 2023-04-11 DIAGNOSIS — E871 Hypo-osmolality and hyponatremia: Secondary | ICD-10-CM | POA: Diagnosis present

## 2023-04-11 DIAGNOSIS — Z634 Disappearance and death of family member: Secondary | ICD-10-CM

## 2023-04-11 DIAGNOSIS — I110 Hypertensive heart disease with heart failure: Secondary | ICD-10-CM

## 2023-04-11 DIAGNOSIS — Z602 Problems related to living alone: Secondary | ICD-10-CM | POA: Diagnosis present

## 2023-04-11 DIAGNOSIS — I272 Pulmonary hypertension, unspecified: Secondary | ICD-10-CM | POA: Diagnosis present

## 2023-04-11 DIAGNOSIS — E1152 Type 2 diabetes mellitus with diabetic peripheral angiopathy with gangrene: Secondary | ICD-10-CM

## 2023-04-11 DIAGNOSIS — I96 Gangrene, not elsewhere classified: Secondary | ICD-10-CM | POA: Diagnosis not present

## 2023-04-11 DIAGNOSIS — Z79899 Other long term (current) drug therapy: Secondary | ICD-10-CM | POA: Diagnosis not present

## 2023-04-11 DIAGNOSIS — Z888 Allergy status to other drugs, medicaments and biological substances status: Secondary | ICD-10-CM | POA: Diagnosis not present

## 2023-04-11 DIAGNOSIS — Z7984 Long term (current) use of oral hypoglycemic drugs: Secondary | ICD-10-CM | POA: Diagnosis not present

## 2023-04-11 DIAGNOSIS — Z7901 Long term (current) use of anticoagulants: Secondary | ICD-10-CM

## 2023-04-11 DIAGNOSIS — I2510 Atherosclerotic heart disease of native coronary artery without angina pectoris: Secondary | ICD-10-CM | POA: Diagnosis present

## 2023-04-11 HISTORY — DX: Chronic obstructive pulmonary disease, unspecified: J44.9

## 2023-04-11 HISTORY — PX: AMPUTATION: SHX166

## 2023-04-11 LAB — CBC WITH DIFFERENTIAL/PLATELET
Abs Immature Granulocytes: 0.09 10*3/uL — ABNORMAL HIGH (ref 0.00–0.07)
Basophils Absolute: 0 10*3/uL (ref 0.0–0.1)
Basophils Relative: 0 %
Eosinophils Absolute: 0.1 10*3/uL (ref 0.0–0.5)
Eosinophils Relative: 1 %
HCT: 31.8 % — ABNORMAL LOW (ref 39.0–52.0)
Hemoglobin: 10.4 g/dL — ABNORMAL LOW (ref 13.0–17.0)
Immature Granulocytes: 1 %
Lymphocytes Relative: 7 %
Lymphs Abs: 0.8 10*3/uL (ref 0.7–4.0)
MCH: 26.4 pg (ref 26.0–34.0)
MCHC: 32.7 g/dL (ref 30.0–36.0)
MCV: 80.7 fL (ref 80.0–100.0)
Monocytes Absolute: 1.1 10*3/uL — ABNORMAL HIGH (ref 0.1–1.0)
Monocytes Relative: 9 %
Neutro Abs: 9.9 10*3/uL — ABNORMAL HIGH (ref 1.7–7.7)
Neutrophils Relative %: 82 %
Platelets: 217 10*3/uL (ref 150–400)
RBC: 3.94 MIL/uL — ABNORMAL LOW (ref 4.22–5.81)
RDW: 15 % (ref 11.5–15.5)
WBC: 12 10*3/uL — ABNORMAL HIGH (ref 4.0–10.5)
nRBC: 0 % (ref 0.0–0.2)

## 2023-04-11 LAB — COMPREHENSIVE METABOLIC PANEL
ALT: 11 U/L (ref 0–44)
AST: 52 U/L — ABNORMAL HIGH (ref 15–41)
Albumin: 3.1 g/dL — ABNORMAL LOW (ref 3.5–5.0)
Alkaline Phosphatase: 54 U/L (ref 38–126)
Anion gap: 14 (ref 5–15)
BUN: 35 mg/dL — ABNORMAL HIGH (ref 6–20)
CO2: 20 mmol/L — ABNORMAL LOW (ref 22–32)
Calcium: 8.7 mg/dL — ABNORMAL LOW (ref 8.9–10.3)
Chloride: 88 mmol/L — ABNORMAL LOW (ref 98–111)
Creatinine, Ser: 1.92 mg/dL — ABNORMAL HIGH (ref 0.61–1.24)
GFR, Estimated: 40 mL/min — ABNORMAL LOW (ref >60)
Glucose, Bld: 112 mg/dL — ABNORMAL HIGH (ref 70–99)
Potassium: 4.9 mmol/L (ref 3.5–5.1)
Sodium: 122 mmol/L — ABNORMAL LOW (ref 135–145)
Total Bilirubin: 1.5 mg/dL — ABNORMAL HIGH (ref 0.3–1.2)
Total Protein: 8.6 g/dL — ABNORMAL HIGH (ref 6.5–8.1)

## 2023-04-11 LAB — GLUCOSE, CAPILLARY
Glucose-Capillary: 112 mg/dL — ABNORMAL HIGH (ref 70–99)
Glucose-Capillary: 128 mg/dL — ABNORMAL HIGH (ref 70–99)

## 2023-04-11 LAB — TYPE AND SCREEN
ABO/RH(D): O POS
Antibody Screen: NEGATIVE

## 2023-04-11 SURGERY — AMPUTATION BELOW KNEE
Anesthesia: Monitor Anesthesia Care | Site: Knee | Laterality: Left

## 2023-04-11 MED ORDER — ZINC SULFATE 220 (50 ZN) MG PO CAPS
220.0000 mg | ORAL_CAPSULE | Freq: Every day | ORAL | Status: DC
  Administered 2023-04-11 – 2023-04-16 (×6): 220 mg via ORAL
  Filled 2023-04-11 (×6): qty 1

## 2023-04-11 MED ORDER — VITAMIN C 500 MG PO TABS
1000.0000 mg | ORAL_TABLET | Freq: Every day | ORAL | Status: DC
  Administered 2023-04-11 – 2023-04-16 (×6): 1000 mg via ORAL
  Filled 2023-04-11 (×6): qty 2

## 2023-04-11 MED ORDER — PROPOFOL 500 MG/50ML IV EMUL
INTRAVENOUS | Status: DC | PRN
  Administered 2023-04-11: 30 ug/kg/min via INTRAVENOUS

## 2023-04-11 MED ORDER — GABAPENTIN 300 MG PO CAPS
300.0000 mg | ORAL_CAPSULE | Freq: Two times a day (BID) | ORAL | Status: DC
  Administered 2023-04-11 – 2023-04-16 (×11): 300 mg via ORAL
  Filled 2023-04-11 (×11): qty 1

## 2023-04-11 MED ORDER — OXYCODONE HCL 5 MG PO TABS
5.0000 mg | ORAL_TABLET | ORAL | Status: DC | PRN
  Administered 2023-04-14 – 2023-04-15 (×2): 10 mg via ORAL
  Filled 2023-04-11 (×3): qty 2

## 2023-04-11 MED ORDER — ASPIRIN 81 MG PO TBEC
81.0000 mg | DELAYED_RELEASE_TABLET | Freq: Every day | ORAL | Status: DC
  Administered 2023-04-11 – 2023-04-16 (×6): 81 mg via ORAL
  Filled 2023-04-11 (×6): qty 1

## 2023-04-11 MED ORDER — DAPAGLIFLOZIN PROPANEDIOL 10 MG PO TABS
10.0000 mg | ORAL_TABLET | Freq: Every day | ORAL | Status: DC
  Filled 2023-04-11: qty 1

## 2023-04-11 MED ORDER — CHLORHEXIDINE GLUCONATE 0.12 % MT SOLN
15.0000 mL | Freq: Once | OROMUCOSAL | Status: AC
  Administered 2023-04-11: 15 mL via OROMUCOSAL

## 2023-04-11 MED ORDER — LABETALOL HCL 5 MG/ML IV SOLN: 10.0000 mg | INTRAVENOUS | Status: DC | PRN

## 2023-04-11 MED ORDER — MAGNESIUM CITRATE PO SOLN: 1.0000 | Freq: Once | ORAL | Status: DC | PRN

## 2023-04-11 MED ORDER — METOPROLOL TARTRATE 5 MG/5ML IV SOLN: 2.0000 mg | INTRAVENOUS | Status: DC | PRN

## 2023-04-11 MED ORDER — PANTOPRAZOLE SODIUM 40 MG PO TBEC
40.0000 mg | DELAYED_RELEASE_TABLET | Freq: Every day | ORAL | Status: DC
  Administered 2023-04-11 – 2023-04-16 (×6): 40 mg via ORAL
  Filled 2023-04-11 (×6): qty 1

## 2023-04-11 MED ORDER — ONDANSETRON HCL 4 MG/2ML IJ SOLN: 4.0000 mg | Freq: Four times a day (QID) | INTRAMUSCULAR | Status: DC | PRN

## 2023-04-11 MED ORDER — MIDAZOLAM HCL 2 MG/2ML IJ SOLN: 1.0000 mg | Freq: Once | INTRAMUSCULAR | Status: AC

## 2023-04-11 MED ORDER — ACETAMINOPHEN 500 MG PO TABS
1000.0000 mg | ORAL_TABLET | Freq: Once | ORAL | Status: DC
  Filled 2023-04-11: qty 2

## 2023-04-11 MED ORDER — JUVEN PO PACK
1.0000 | PACK | Freq: Two times a day (BID) | ORAL | Status: DC
  Administered 2023-04-11 – 2023-04-15 (×7): 1 via ORAL
  Filled 2023-04-11 (×8): qty 1

## 2023-04-11 MED ORDER — CEFAZOLIN SODIUM-DEXTROSE 2-4 GM/100ML-% IV SOLN
2.0000 g | INTRAVENOUS | Status: AC
  Administered 2023-04-11: 2 g via INTRAVENOUS
  Filled 2023-04-11: qty 100

## 2023-04-11 MED ORDER — CEFAZOLIN SODIUM-DEXTROSE 2-4 GM/100ML-% IV SOLN
2.0000 g | Freq: Three times a day (TID) | INTRAVENOUS | Status: AC
  Administered 2023-04-11 – 2023-04-12 (×2): 2 g via INTRAVENOUS
  Filled 2023-04-11 (×2): qty 100

## 2023-04-11 MED ORDER — POLYETHYLENE GLYCOL 3350 17 G PO PACK
17.0000 g | PACK | Freq: Every day | ORAL | Status: DC | PRN
  Administered 2023-04-14: 17 g via ORAL
  Filled 2023-04-11: qty 1

## 2023-04-11 MED ORDER — GUAIFENESIN-DM 100-10 MG/5ML PO SYRP: 15.0000 mL | ORAL_SOLUTION | ORAL | Status: DC | PRN

## 2023-04-11 MED ORDER — FUROSEMIDE 40 MG PO TABS
40.0000 mg | ORAL_TABLET | Freq: Every day | ORAL | Status: DC
  Administered 2023-04-11 – 2023-04-16 (×6): 40 mg via ORAL
  Filled 2023-04-11 (×6): qty 1

## 2023-04-11 MED ORDER — ROPIVACAINE HCL 5 MG/ML IJ SOLN
INTRAMUSCULAR | Status: DC | PRN
  Administered 2023-04-11: 30 mL via PERINEURAL
  Administered 2023-04-11: 20 mL via PERINEURAL

## 2023-04-11 MED ORDER — DOCUSATE SODIUM 100 MG PO CAPS
100.0000 mg | ORAL_CAPSULE | Freq: Every day | ORAL | Status: DC
  Administered 2023-04-12 – 2023-04-15 (×4): 100 mg via ORAL
  Filled 2023-04-11 (×5): qty 1

## 2023-04-11 MED ORDER — HYDROMORPHONE HCL 1 MG/ML IJ SOLN: 0.5000 mg | INTRAMUSCULAR | Status: DC | PRN

## 2023-04-11 MED ORDER — FENTANYL CITRATE (PF) 100 MCG/2ML IJ SOLN: 50.0000 ug | Freq: Once | INTRAMUSCULAR | Status: AC

## 2023-04-11 MED ORDER — FENTANYL CITRATE (PF) 100 MCG/2ML IJ SOLN
INTRAMUSCULAR | Status: AC
  Administered 2023-04-11: 50 ug via INTRAVENOUS
  Filled 2023-04-11: qty 2

## 2023-04-11 MED ORDER — ACETAMINOPHEN 325 MG PO TABS: 325.0000 mg | ORAL_TABLET | Freq: Four times a day (QID) | ORAL | Status: DC | PRN

## 2023-04-11 MED ORDER — ATORVASTATIN CALCIUM 80 MG PO TABS
80.0000 mg | ORAL_TABLET | Freq: Every day | ORAL | Status: DC
  Administered 2023-04-11 – 2023-04-16 (×6): 80 mg via ORAL
  Filled 2023-04-11 (×6): qty 1

## 2023-04-11 MED ORDER — BISACODYL 5 MG PO TBEC: 5.0000 mg | DELAYED_RELEASE_TABLET | Freq: Every day | ORAL | Status: DC | PRN

## 2023-04-11 MED ORDER — LACTATED RINGERS IV SOLN: INTRAVENOUS | Status: DC

## 2023-04-11 MED ORDER — MIDAZOLAM HCL 2 MG/2ML IJ SOLN
INTRAMUSCULAR | Status: AC
  Administered 2023-04-11: 1 mg via INTRAVENOUS
  Filled 2023-04-11: qty 2

## 2023-04-11 MED ORDER — ALBUMIN HUMAN 5 % IV SOLN: INTRAVENOUS | Status: DC | PRN

## 2023-04-11 MED ORDER — ALUM & MAG HYDROXIDE-SIMETH 200-200-20 MG/5ML PO SUSP: 15.0000 mL | ORAL | Status: DC | PRN

## 2023-04-11 MED ORDER — SODIUM CHLORIDE 0.9 % IV SOLN: INTRAVENOUS | Status: DC

## 2023-04-11 MED ORDER — POTASSIUM CHLORIDE CRYS ER 20 MEQ PO TBCR: 20.0000 meq | EXTENDED_RELEASE_TABLET | Freq: Every day | ORAL | Status: DC | PRN

## 2023-04-11 MED ORDER — METOPROLOL SUCCINATE ER 25 MG PO TB24
25.0000 mg | ORAL_TABLET | Freq: Every day | ORAL | Status: DC
  Administered 2023-04-11 – 2023-04-15 (×5): 25 mg via ORAL
  Filled 2023-04-11 (×5): qty 1

## 2023-04-11 MED ORDER — PHENYLEPHRINE 80 MCG/ML (10ML) SYRINGE FOR IV PUSH (FOR BLOOD PRESSURE SUPPORT)
PREFILLED_SYRINGE | INTRAVENOUS | Status: DC | PRN
  Administered 2023-04-11: 80 ug via INTRAVENOUS

## 2023-04-11 MED ORDER — MAGNESIUM SULFATE 2 GM/50ML IV SOLN: 2.0000 g | Freq: Every day | INTRAVENOUS | Status: DC | PRN

## 2023-04-11 MED ORDER — PHENOL 1.4 % MT LIQD: 1.0000 | OROMUCOSAL | Status: DC | PRN

## 2023-04-11 MED ORDER — OXYCODONE HCL 5 MG PO TABS
10.0000 mg | ORAL_TABLET | ORAL | Status: DC | PRN
  Administered 2023-04-12 – 2023-04-16 (×5): 15 mg via ORAL
  Filled 2023-04-11 (×5): qty 3

## 2023-04-11 MED ORDER — RIVAROXABAN 2.5 MG PO TABS
2.5000 mg | ORAL_TABLET | Freq: Two times a day (BID) | ORAL | Status: DC
  Administered 2023-04-11 – 2023-04-16 (×10): 2.5 mg via ORAL
  Filled 2023-04-11 (×11): qty 1

## 2023-04-11 MED ORDER — SPIRONOLACTONE 25 MG PO TABS
25.0000 mg | ORAL_TABLET | Freq: Every day | ORAL | Status: DC
  Administered 2023-04-11 – 2023-04-12 (×2): 25 mg via ORAL
  Filled 2023-04-11 (×2): qty 1

## 2023-04-11 MED ORDER — FENTANYL CITRATE (PF) 100 MCG/2ML IJ SOLN: 25.0000 ug | INTRAMUSCULAR | Status: DC | PRN

## 2023-04-11 MED ORDER — CHLORHEXIDINE GLUCONATE 0.12 % MT SOLN
OROMUCOSAL | Status: AC
  Filled 2023-04-11: qty 15

## 2023-04-11 MED ORDER — 0.9 % SODIUM CHLORIDE (POUR BTL) OPTIME
TOPICAL | Status: DC | PRN
  Administered 2023-04-11: 1000 mL

## 2023-04-11 MED ORDER — HYDRALAZINE HCL 20 MG/ML IJ SOLN: 5.0000 mg | INTRAMUSCULAR | Status: DC | PRN

## 2023-04-11 MED ORDER — ORAL CARE MOUTH RINSE: 15.0000 mL | Freq: Once | OROMUCOSAL | Status: AC

## 2023-04-11 MED ORDER — DEXAMETHASONE SODIUM PHOSPHATE 10 MG/ML IJ SOLN
INTRAMUSCULAR | Status: DC | PRN
  Administered 2023-04-11 (×2): 5 mg

## 2023-04-11 SURGICAL SUPPLY — 43 items
BAG COUNTER SPONGE SURGICOUNT (BAG) IMPLANT
BAG SPNG CNTER NS LX DISP (BAG)
BIT DRILL 3.2XOCPTL (BIT) ×1 IMPLANT
BIT DRL 3.2XOCPTL (BIT)
BLADE SAW RECIP 87.9 MT (BLADE) ×1 IMPLANT
BLADE SURG 21 STRL SS (BLADE) ×1 IMPLANT
BNDG CMPR 5X6 CHSV STRCH STRL (GAUZE/BANDAGES/DRESSINGS)
BNDG COHESIVE 6X5 TAN ST LF (GAUZE/BANDAGES/DRESSINGS) IMPLANT
CANISTER WOUND CARE 500ML ATS (WOUND CARE) ×1 IMPLANT
COVER SURGICAL LIGHT HANDLE (MISCELLANEOUS) ×1 IMPLANT
CUFF TOURN SGL QUICK 34 (TOURNIQUET CUFF) ×1
CUFF TRNQT CYL 34X4.125X (TOURNIQUET CUFF) ×1 IMPLANT
DRAPE DERMATAC (DRAPES) IMPLANT
DRAPE INCISE IOBAN 66X45 STRL (DRAPES) ×1 IMPLANT
DRAPE U-SHAPE 47X51 STRL (DRAPES) ×1 IMPLANT
DRESSING PREVENA PLUS CUSTOM (GAUZE/BANDAGES/DRESSINGS) ×1 IMPLANT
DRILL BIT (BIT)
DRSG PREVENA PLUS CUSTOM (GAUZE/BANDAGES/DRESSINGS) ×1
DURAPREP 26ML APPLICATOR (WOUND CARE) ×1 IMPLANT
ELECT REM PT RETURN 9FT ADLT (ELECTROSURGICAL) ×1
ELECTRODE REM PT RTRN 9FT ADLT (ELECTROSURGICAL) ×1 IMPLANT
GLOVE BIOGEL PI IND STRL 9 (GLOVE) ×1 IMPLANT
GLOVE SURG ORTHO 9.0 STRL STRW (GLOVE) ×1 IMPLANT
GOWN STRL REUS W/ TWL XL LVL3 (GOWN DISPOSABLE) ×2 IMPLANT
GOWN STRL REUS W/TWL XL LVL3 (GOWN DISPOSABLE) ×2
GRAFT SKIN WND MICRO 38 (Tissue) IMPLANT
KIT BASIN OR (CUSTOM PROCEDURE TRAY) ×1 IMPLANT
KIT TURNOVER KIT B (KITS) ×1 IMPLANT
MANIFOLD NEPTUNE II (INSTRUMENTS) ×1 IMPLANT
NS IRRIG 1000ML POUR BTL (IV SOLUTION) ×1 IMPLANT
PACK ORTHO EXTREMITY (CUSTOM PROCEDURE TRAY) ×1 IMPLANT
PAD ARMBOARD 7.5X6 YLW CONV (MISCELLANEOUS) ×1 IMPLANT
PREVENA RESTOR ARTHOFORM 46X30 (CANNISTER) ×1 IMPLANT
SPONGE T-LAP 18X18 ~~LOC~~+RFID (SPONGE) IMPLANT
STAPLER VISISTAT 35W (STAPLE) IMPLANT
STOCKINETTE IMPERVIOUS LG (DRAPES) ×1 IMPLANT
SUT ETHILON 2 0 PSLX (SUTURE) IMPLANT
SUT SILK 2 0 (SUTURE) ×1
SUT SILK 2-0 18XBRD TIE 12 (SUTURE) ×1 IMPLANT
SUT VIC AB 1 CTX 27 (SUTURE) ×2 IMPLANT
TOWEL GREEN STERILE (TOWEL DISPOSABLE) ×1 IMPLANT
TUBE CONNECTING 12X1/4 (SUCTIONS) ×1 IMPLANT
YANKAUER SUCT BULB TIP NO VENT (SUCTIONS) ×1 IMPLANT

## 2023-04-11 NOTE — H&P (Signed)
Edward Rocha. is an 57 y.o. male.   Chief Complaint: Gangrene left foot HPI: Patient is a 57 year old gentleman is seen in follow-up status post foot salvage intervention on the left with revascularization amputation of the second toe and debridement and tissue grafting for the left Achilles wound. Patient reports increasing pain increasing necrotic changes.   Past Medical History:  Diagnosis Date   Acute on chronic renal failure (HCC)    COPD (chronic obstructive pulmonary disease) (HCC)    Essential hypertension    GERD (gastroesophageal reflux disease)    Hyperosmolar syndrome 2020   Hypothyroidism    Mild coronary artery disease    Cardiac catheterization September 2022 - [facility]   Nonischemic cardiomyopathy [facility])    PAD (peripheral artery disease) (HCC)    Thrombocytopenia (HCC)    Type 2 diabetes mellitus (HCC)     Past Surgical History:  Procedure Laterality Date   ABDOMINAL AORTOGRAM W/LOWER EXTREMITY N/A 02/06/2019   Procedure: ABDOMINAL AORTOGRAM W/LOWER EXTREMITY;  Surgeon: Cephus Shelling, MD;  Location: [facility] INVASIVE CV LAB;  Service: Cardiovascular;  Laterality: N/A;  bilateral   ABDOMINAL AORTOGRAM W/LOWER EXTREMITY N/A 04/30/2019   Procedure: ABDOMINAL AORTOGRAM W/LOWER EXTREMITY;  Surgeon: Cephus Shelling, MD;  Location: [facility] INVASIVE CV LAB;  Service: Cardiovascular;  Laterality: N/A;   ABDOMINAL AORTOGRAM W/LOWER EXTREMITY N/A 02/28/2023   Procedure: ABDOMINAL AORTOGRAM W/LOWER EXTREMITY;  Surgeon: Cephus Shelling, MD;  Location: [facility] INVASIVE CV LAB;  Service: Cardiovascular;  Laterality: N/A;   AMPUTATION TOE Left 03/05/2023   Procedure: AMPUTATION LEFT SECOND TOE;  Surgeon: Cephus Shelling, MD;  Location: [facility] OR;  Service: Vascular;  Laterality: Left;   AORTOGRAM  08/11/2019   Procedure: Aortogram;  Surgeon: Cephus Shelling, MD;  Location: [facility] OR;  Service: Vascular;;   CARDIAC CATHETERIZATION     ENDARTERECTOMY FEMORAL Left 03/05/2023    Procedure: LEFT COMMON FEMORAL ENDARTERECTOMY WITH PROFUNDOPLASTY AND PATCH ANGIOPLASTY;  Surgeon: Cephus Shelling, MD;  Location: [facility] OR;  Service: Vascular;  Laterality: Left;   ESOPHAGEAL BANDING  08/22/2019   Procedure: ESOPHAGEAL BANDING;  Surgeon: Rachael Fee, MD;  Location: [facility] ENDOSCOPY;  Service: Endoscopy;;   ESOPHAGOGASTRODUODENOSCOPY N/A 08/22/2019   Procedure: ESOPHAGOGASTRODUODENOSCOPY (EGD);  Surgeon: Rachael Fee, MD;  Location: [facility] ENDOSCOPY;  Service: Endoscopy;  Laterality: N/A;   FEMORAL-POPLITEAL BYPASS GRAFT Left 08/11/2019   Procedure: BYPASS LEFT FEMORAL-DISTAL POPLITEAL ARTERY USING PROPATEN GRAFT;  Surgeon: Cephus Shelling, MD;  Location: [facility] OR;  Service: Vascular;  Laterality: Left;   FEMORAL-TIBIAL BYPASS GRAFT Left 03/05/2023   Procedure: LEFT COMMON FEMORAL ARTERY TO POSTERIOR TIBIAL ARTERY BYPASS USING PTFE;  Surgeon: Cephus Shelling, MD;  Location: [facility] OR;  Service: Vascular;  Laterality: Left;   I & D EXTREMITY Left 03/09/2023   Procedure: LEFT HEEL DEBRIDEMENT;  Surgeon: [name], MD;  Location: [facility] OR;  Service: Orthopedics;  Laterality: Left;   INSERTION OF DIALYSIS CATHETER Right 09/09/2019   Procedure: INSERTION OF DIALYSIS CATHETER;  Surgeon: Maeola Harman, MD;  Location[facility] OR;  Service: Vascular;  Laterality: Right;   IRRIGATION AND DEBRIDEMENT FOOT Left 03/05/2023   Procedure: IRRIGATION AND DEBRIDEMENT LEFT HEEL WOUND;  Surgeon: Cephus Shelling, MD;  Location: [facility] OR;  Service: Vascular;  Laterality: Left;   MULTIPLE TOOTH EXTRACTIONS     PERIPHERAL VASCULAR INTERVENTION  02/06/2019   Procedure: PERIPHERAL VASCULAR INTERVENTION;  Surgeon: Cephus Shelling, MD;  Location: [facility]VAS[facility] CV LAB;  Service: Cardiovascular;;  Bilateral Iliacs   PERIPHERAL VASCULAR INTERVENTION  04/30/2019   Procedure: PERIPHERAL VASCULAR INTERVENTION;  Surgeon: Cephus Shelling, MD;  Location: [facility] CV LAB;  Service:  Cardiovascular;;  Stent - Lt. Iliac    REMOVAL OF A DIALYSIS CATHETER Right 09/09/2019   Procedure: Removal Of A Dialysis Catheter;  Surgeon: Maeola Harman, MD;  Location: [facility] OR;  Service: Vascular;  Laterality: Right;   RIGHT HEART CATH N/A 12/12/2021   Procedure: RIGHT HEART CATH;  Surgeon: Laurey Morale, MD;  Location: [facility] INVASIVE CV LAB;  Service: Cardiovascular;  Laterality: N/A;   ULTRASOUND GUIDANCE FOR VASCULAR ACCESS Right 09/09/2019   Procedure: Ultrasound Guidance For Vascular Access;  Surgeon: Maeola Harman, MD;  Location: [facility] OR;  Service: Vascular;  Laterality: Right;   VASCULAR SURGERY      Family History  Problem Relation Age of Onset   Alcohol abuse Father    Cancer Mother    Alcohol abuse Brother    Bipolar disorder Son    Bipolar disorder Daughter    Social History:  reports that he quit smoking about 4 years ago. His smoking use included cigarettes. He has a 36.00 pack-year smoking history. He has never been exposed to tobacco smoke. He has never used smokeless tobacco. He reports that he does not currently use alcohol. He reports that he does not use drugs.  Allergies:  Allergies  Allergen Reactions   Other Other (See Comments)    Pt received platelets and had a bad reaction from infusion     No medications prior to admission.    No results found for this or any previous visit (from the past 48 hour(s)). No results found.  Review of Systems  All other systems reviewed and are negative.   There were no vitals taken for this visit. Physical Exam  Patient is alert, oriented, no adenopathy, well-dressed, normal affect, normal respiratory effort. Examination patient's left foot is cold to the touch there is increased swelling.  There is wound necrosis and breakdown of the second toe amputation.  There is complete wound breakdown and necrosis of the Achilles debridement and soft tissue reinforcement.  The Doppler was used and venous  flow can be heard over the dorsalis pedis and posterior tibial artery however I cannot hear a arterial flow. Assessment/Plan Assessment: Gangrene left heel status post revascularization and debridement of a gangrenous ulcer left heel.  Plan: Will plan for left transtibial amputation.  Risks and benefits were discussed including risk of the wound not healing need for additional surgery.  Patient states he understands wished to proceed at this time.  [name], MD 04/11/2023, 6:38 AM

## 2023-04-11 NOTE — Op Note (Signed)
04/11/2023  11:01 AM  PATIENT:  Edward Rocha.    PRE-OPERATIVE DIAGNOSIS:  Gangrene Left Foot  POST-OPERATIVE DIAGNOSIS:  Same  PROCEDURE:  LEFT BELOW KNEE AMPUTATION Application of Kerecis micro graft 38 cm  Application of Prevena customizable and Prevena arthroform wound VAC dressings Application of Vive Wear stump shrinker and the Hanger limb protector  SURGEON:  [name], MD  ANESTHESIA:   General  PREOPERATIVE INDICATIONS:  [name]. is a  57 y.o. male with a diagnosis of Gangrene Left Foot who failed conservative measures and elected for surgical management.    The risks benefits and alternatives were discussed with the patient preoperatively including but not limited to the risks of infection, bleeding, nerve injury, cardiopulmonary complications, the need for revision surgery, among others, and the patient was willing to proceed.  OPERATIVE IMPLANTS:   Implant Name Type Inv. Item Serial No. Manufacturer Lot No. LRB No. Used Action  GRAFT SKIN WND MICRO 38 - [id number] Tissue GRAFT SKIN WND MICRO 38  [employer] [id number] Left 1 Implanted     OPERATIVE FINDINGS: Artery is calcified  OPERATIVE PROCEDURE: Patient was brought to the operating room after undergoing a regional anesthetic.  After adequate levels anesthesia were obtained a thigh tourniquet was placed and the lower extremity was prepped using DuraPrep draped into a sterile field. The foot was draped out of the sterile field with impervious stockinette.  A timeout was called and the tourniquet inflated.  A transverse skin incision was made 12 cm distal to the tibial tubercle, the incision curved proximally, and a large posterior flap was created.  The tibia was transected just proximal to the skin incision and beveled anteriorly.  The fibula was transected just proximal to the tibial incision.  The sciatic nerve was pulled cut and allowed to retract.  The vascular bundles were suture ligated with 2-0  silk.  The tourniquet was deflated and hemostasis obtained.        The Kerecis micro powder 38 cm was applied to the open wound that has a 200 cm surface area.  The deep and superficial fascial layers were closed using #1 Vicryl.  The skin was closed using staples.    The Prevena customizable dressing was applied this was overwrapped with the arthroform sponge.  Collier Flowers was used to secure the sponges and the circumferential compression was secured to the skin with Dermatac.  This was connected to the wound VAC pump and had a good suction fit this was covered with a stump shrinker and a limb protector.  Patient was taken to the PACU in stable condition.   DISCHARGE PLANNING:  Antibiotic duration: 24-hour antibiotics  Weightbearing: Nonweightbearing on the operative extremity  Pain medication: Opioid pathway  Dressing care/ Wound VAC: Continue wound VAC with the Prevena plus pump at discharge for 1 week  Ambulatory devices: Walker or kneeling scooter  Discharge to: Discharge planning based on recommendations per physical therapy  Follow-up: In the office 1 week after discharge.

## 2023-04-11 NOTE — Anesthesia Procedure Notes (Signed)
Anesthesia Regional Block: Popliteal block   Pre-Anesthetic Checklist: , timeout performed,  Correct Patient, Correct Site, Correct Laterality,  Correct Procedure, Correct Position, site marked,  Risks and benefits discussed,  Pre-op evaluation,  At surgeon's request and post-op pain management  Laterality: Left  Prep: Maximum Sterile Barrier Precautions used, chloraprep       Needles:  Injection technique: Single-shot  Needle Type: Echogenic Stimulator Needle     Needle Length: 9cm  Needle Gauge: 21     Additional Needles:   Procedures:,,,, ultrasound used (permanent image in chart),,    Narrative:  Start time: 04/11/2023 9:15 AM End time: 04/11/2023 9:18 AM Injection made incrementally with aspirations every 5 mL. Anesthesiologist: Elmer Picker, MD

## 2023-04-11 NOTE — Transfer of Care (Signed)
Immediate Anesthesia Transfer of Care Note  Patient: Edward Rocha.  Procedure(s) Performed: LEFT BELOW KNEE AMPUTATION (Left: Knee)  Patient Location: PACU  Anesthesia Type:MAC combined with regional for post-op pain  Level of Consciousness: drowsy and patient cooperative  Airway & Oxygen Therapy: Patient Spontanous Breathing  Post-op Assessment: Report given to RN and Post -op Vital signs reviewed and stable  Post vital signs: Reviewed and stable  Last Vitals:  Vitals Value Taken Time  BP 118/81 04/11/23 1050  Temp 36.7 C 04/11/23 1050  Pulse 76 04/11/23 1053  Resp 10 04/11/23 1053  SpO2 97 % 04/11/23 1053  Vitals shown include unvalidated device data.  Last Pain:  Vitals:   04/11/23 0930  TempSrc:   PainSc: Asleep         Complications: No notable events documented.

## 2023-04-11 NOTE — Anesthesia Procedure Notes (Signed)
Anesthesia Regional Block: Popliteal block   Pre-Anesthetic Checklist: , timeout performed,  Correct Patient, Correct Site, Correct Laterality,  Correct Procedure, Correct Position, site marked,  Risks and benefits discussed,  Pre-op evaluation,  At surgeon's request and post-op pain management  Laterality: Left  Prep: Maximum Sterile Barrier Precautions used, chloraprep       Needles:  Injection technique: Single-shot  Needle Type: Echogenic Stimulator Needle     Needle Length: 9cm  Needle Gauge: 21     Additional Needles:   Procedures:,,,, ultrasound used (permanent image in chart),,    Narrative:  Start time: 04/11/2023 9:18 AM End time: 04/11/2023 9:21 AM Injection made incrementally with aspirations every 5 mL. Anesthesiologist: Elmer Picker, MD

## 2023-04-11 NOTE — Interval H&P Note (Signed)
History and Physical Interval Note:  04/11/2023 9:53 AM  Edward Rocha.  has presented today for surgery, with the diagnosis of Gangrene Left Foot.  The various methods of treatment have been discussed with the patient and family. After consideration of risks, benefits and other options for treatment, the patient has consented to  Procedure(s): LEFT BELOW KNEE AMPUTATION (Left) as a surgical intervention.  The patient's history has been reviewed, patient examined, no change in status, stable for surgery.  I have reviewed the patient's chart and labs.  Questions were answered to the patient's satisfaction.     [name]

## 2023-04-11 NOTE — Anesthesia Postprocedure Evaluation (Signed)
Anesthesia Post Note  Patient: Edward Rocha.  Procedure(s) Performed: LEFT BELOW KNEE AMPUTATION (Left: Knee)     Patient location during evaluation: PACU Anesthesia Type: Regional and MAC Level of consciousness: awake and alert Pain management: pain level controlled Vital Signs Assessment: post-procedure vital signs reviewed and stable Respiratory status: spontaneous breathing, nonlabored ventilation, respiratory function stable and patient connected to nasal cannula oxygen Cardiovascular status: stable and blood pressure returned to baseline Postop Assessment: no apparent nausea or vomiting Anesthetic complications: no  No notable events documented.  Last Vitals:  Vitals:   04/11/23 1200 04/11/23 1217  BP: 125/86 (!) 159/88  Pulse: 77 86  Resp: 16 17  Temp:  36.7 C  SpO2: 98% 100%    Last Pain:  Vitals:   04/11/23 1217  TempSrc: Oral  PainSc:                  [name] [name]

## 2023-04-11 NOTE — Plan of Care (Signed)
  Problem: Education: Goal: Knowledge of General Education information will improve Description: Including pain rating scale, medication(s)/side effects and non-pharmacologic comfort measures Outcome: Progressing   Problem: Activity: Goal: Risk for activity intolerance will decrease Outcome: Progressing   Problem: Nutrition: Goal: Adequate nutrition will be maintained Outcome: Progressing   Problem: Elimination: Goal: Will not experience complications related to bowel motility Outcome: Progressing   Problem: Pain Managment: Goal: General experience of comfort will improve Outcome: Progressing   

## 2023-04-11 NOTE — Progress Notes (Signed)
Inpatient Rehab Admissions Coordinator:   Consult received.  Await post-op therapy evaluations.   Estill Dooms, PT, DPT Admissions Coordinator [phone] 04/11/23  1:34 PM

## 2023-04-12 ENCOUNTER — Encounter (HOSPITAL_COMMUNITY): Payer: Self-pay | Admitting: Orthopedic Surgery

## 2023-04-12 DIAGNOSIS — Z89512 Acquired absence of left leg below knee: Secondary | ICD-10-CM

## 2023-04-12 LAB — BASIC METABOLIC PANEL
Anion gap: 9 (ref 5–15)
BUN: 37 mg/dL — ABNORMAL HIGH (ref 6–20)
CO2: 20 mmol/L — ABNORMAL LOW (ref 22–32)
Calcium: 8.5 mg/dL — ABNORMAL LOW (ref 8.9–10.3)
Chloride: 96 mmol/L — ABNORMAL LOW (ref 98–111)
Creatinine, Ser: 1.27 mg/dL — ABNORMAL HIGH (ref 0.61–1.24)
GFR, Estimated: >60 mL/min (ref >60)
Glucose, Bld: 225 mg/dL — ABNORMAL HIGH (ref 70–99)
Potassium: 4.6 mmol/L (ref 3.5–5.1)
Sodium: 125 mmol/L — ABNORMAL LOW (ref 135–145)

## 2023-04-12 LAB — CBC
HCT: 26.2 % — ABNORMAL LOW (ref 39.0–52.0)
Hemoglobin: 8.9 g/dL — ABNORMAL LOW (ref 13.0–17.0)
MCH: 26.6 pg (ref 26.0–34.0)
MCHC: 34 g/dL (ref 30.0–36.0)
MCV: 78.4 fL — ABNORMAL LOW (ref 80.0–100.0)
Platelets: 180 10*3/uL (ref 150–400)
RBC: 3.34 MIL/uL — ABNORMAL LOW (ref 4.22–5.81)
RDW: 14.8 % (ref 11.5–15.5)
WBC: 17.3 10*3/uL — ABNORMAL HIGH (ref 4.0–10.5)
nRBC: 0 % (ref 0.0–0.2)

## 2023-04-12 NOTE — Consult Note (Signed)
   [facility] CM Inpatient Consult   04/12/2023  Edward Rocha. September 25, 1966 [id number]  [facility] [[facility]]  Accountable Care Organization [ACO] Patient: [name] Medicare  Primary Care Provider:  Shelby Dubin, FNP with [facility] which is not currently a [facility] [employer] provider   Patient screened for less than 30 days readmission hospitalization with noted high risk score for unplanned readmission risk list. Currently, this patient is not listed with a [facility] provider for PCP.  Patient is being considered for a CIR transition from acute hospital.  PCP is not a provider for [facility] will sign off.  For questions contact:   Charlesetta Shanks, RN BSN CCM [facility]  [phone] business mobile phone Toll free office [phone]  *Concierge Line  [phone] Fax number: [phone] [email or web address]

## 2023-04-12 NOTE — Progress Notes (Signed)
Patient ID: Edward Rocha., male   DOB: 17-Feb-1966, 57 y.o.   MRN: [id number] Patient is postoperative day 1 below-knee amputation on the left.  Patient denies any pain there is no drainage in the wound VAC canister there is a good suction fit.  Discharge planning based on therapy recommendations.

## 2023-04-12 NOTE — Progress Notes (Signed)
Inpatient Rehab Coordinator Note:  I met with patient  at bedside to discuss CIR recommendations and goals/expectations of CIR stay.  We reviewed 3 hrs/day of therapy, physician follow up, and average length of stay 2 weeks (dependent upon progress) with goals tbd.  Note pt declined PT x2 today, but did participate with OT.  Has not made it OOB yet.  He is agreeable to CIR and reports he lives alone so would need to be mod I in order to return.  Has a brother in law that lives in [place] and another that lives in [place] but no one available to provide 24/7 supervision.  Await formal consult for goals and will f/u with patient.    Estill Dooms, PT, DPT Admissions Coordinator [phone] 04/12/23  12:52 PM

## 2023-04-12 NOTE — Evaluation (Signed)
Occupational Therapy Evaluation Patient Details Name: Edward Rocha. MRN: [id number] DOB: [birth year] Today's Date: 04/12/2023   History of Present Illness 57 y.o. male with a diagnosis of Gangrene Left Foot who failed conservative measures and elected for surgical management.  PMH of GERD, HTN, COPD, Femoral bypass, CAD, DM II   Clinical Impression   Pt admitted for above dx, PTA patient lived alone and reports being Mod I in ambulation and bADLs. Pt currently presenting with impaired balance and increased assist to complete bADLs/iADLs. Pt Max A for STS but unable to initiate a hop to gait pattern. Educated pt on the use and doffing/donning of ampushield and strategies to reduce compressive devices against skin. Pt sitting balance a bit unsteady initially but progressed throughout session. Pt would benefit from continued acute skilled OT services to address above deficits and help transition to next level of care. Patient has the potential to reach Mod I and demos the ability to tolerate 3 hours of therapy. Pt would benefit from an intensive rehab program to help maximize functional independence.      Recommendations for follow up therapy are one component of a multi-disciplinary discharge planning process, led by the attending physician.  Recommendations may be updated based on patient status, additional functional criteria and insurance authorization.   Assistance Recommended at Discharge Frequent or constant Supervision/Assistance  Patient can return home with the following Two people to help with walking and/or transfers;A lot of help with bathing/dressing/bathroom;Assistance with cooking/housework;Assist for transportation;Help with stairs or ramp for entrance    Functional Status Assessment  Patient has had a recent decline in their functional status and demonstrates the ability to make significant improvements in function in a reasonable and predictable amount of time.  Equipment  Recommendations  None recommended by OT (defer to next level of care)    Recommendations for Other Services Rehab consult     Precautions / Restrictions Precautions Precautions: Fall Required Braces or Orthoses: Other Brace Other Brace: Ampushield Restrictions Weight Bearing Restrictions: Yes LLE Weight Bearing: Non weight bearing Other Position/Activity Restrictions: L BKA      Mobility Bed Mobility Overal bed mobility: Needs Assistance Bed Mobility: Supine to Sit, Sit to Supine     Supine to sit: Min guard Sit to supine: Min guard   General bed mobility comments: Cueing to use bed rails prn to assist with bed mobility    Transfers Overall transfer level: Needs assistance Equipment used: Rolling walker (2 wheels) Transfers: Sit to/from Stand Sit to Stand: Max assist, From elevated surface           General transfer comment: STS x2 Max A but unable to clear bottom. Cues for pt to readjust hand placements on RW to use more UB strength while OT stabilized RW. STS Max A from elevated surface and adjusted hand placement      Balance Overall balance assessment: Needs assistance Sitting-balance support: Feet supported Sitting balance-Leahy Scale: Fair Sitting balance - Comments: Progressing with sitting balance throughout session   Standing balance support: Reliant on assistive device for balance, Bilateral upper extremity supported Standing balance-Leahy Scale: Zero                             ADL either performed or assessed with clinical judgement   ADL Overall ADL's : Needs assistance/impaired Eating/Feeding: Independent;Bed level   Grooming: Sitting;Min guard   Upper Body Bathing: Sitting;Min guard   Lower Body Bathing: Sitting/lateral leans;Maximal  assistance   Upper Body Dressing : Sitting;Min guard   Lower Body Dressing: Sitting/lateral leans;Maximal assistance   Toilet Transfer: Maximal assistance   Toileting- Clothing Manipulation  and Hygiene: Maximal assistance   Tub/ Shower Transfer: Maximal assistance   Functional mobility during ADLs: Maximal assistance;Rolling walker (2 wheels) (stood at bedside)       Vision         Perception     Praxis      Pertinent Vitals/Pain Pain Assessment Pain Assessment: Faces Faces Pain Scale: Hurts little more Pain Location: LLE Pain Descriptors / Indicators: Aching Pain Intervention(s): Monitored during session, Repositioned     Hand Dominance Right   Extremity/Trunk Assessment Upper Extremity Assessment Upper Extremity Assessment: Overall WFL for tasks assessed   Lower Extremity Assessment Lower Extremity Assessment: Defer to PT evaluation       Communication Communication Communication: No difficulties   Cognition Arousal/Alertness: Awake/alert Behavior During Therapy: WFL for tasks assessed/performed Overall Cognitive Status: Within Functional Limits for tasks assessed                                       General Comments  VSS on RA, no c/o dizziness. pt just reports feeling "off"    Exercises     Shoulder Instructions      Home Living Family/patient expects to be discharged to:: Private residence Living Arrangements: Alone Available Help at Discharge: Neighbor;Family;Available PRN/intermittently Type of Home: House Home Access: Stairs to enter Entrance Stairs-Number of Steps: 1 Entrance Stairs-Rails: None Home Layout: One level     Bathroom Shower/Tub: Chief Strategy Officer: Standard     Home Equipment: Agricultural consultant (2 wheels);Cane - single point;Hand held shower head;Wheelchair - Engineer, technical sales - power   Additional Comments: Home setup from prior notes, Pt reports no change      Prior Functioning/Environment Prior Level of Function : Independent/Modified Independent;Driving             Mobility Comments: Mod I RW; uses cane for community mobility ADLs Comments: Ind        OT Problem  List: Decreased activity tolerance;Impaired balance (sitting and/or standing);Pain      OT Treatment/Interventions: Self-care/ADL training;DME and/or AE instruction;Therapeutic exercise;Therapeutic activities;Patient/family education;Balance training    OT Goals(Current goals can be found in the care plan section) Acute Rehab OT Goals Patient Stated Goal: To get better OT Goal Formulation: With patient Time For Goal Achievement: 04/26/23 Potential to Achieve Goals: Good ADL Goals Pt Will Perform Lower Body Bathing: sitting/lateral leans;with set-up;with supervision Pt Will Perform Lower Body Dressing: sitting/lateral leans;with min guard assist Pt Will Transfer to Toilet: stand pivot transfer;with min assist;bedside commode Additional ADL Goal #1: Pt will don/doff ampushield independently when seated EOB  OT Frequency: Min 3X/week    Co-evaluation              AM-PAC OT "6 Clicks" Daily Activity     Outcome Measure Help from another person eating meals?: None Help from another person taking care of personal grooming?: A Little Help from another person toileting, which includes using toliet, bedpan, or urinal?: A Lot Help from another person bathing (including washing, rinsing, drying)?: A Lot Help from another person to put on and taking off regular upper body clothing?: A Little Help from another person to put on and taking off regular lower body clothing?: A Lot 6 Click Score: 16   End  of Session Equipment Utilized During Treatment: Gait belt;Rolling walker (2 wheels) Nurse Communication: Need for lift equipment (+2 with Huntley Dec stedy)  Activity Tolerance: Patient tolerated treatment well Patient left: in bed;with call bell/phone within reach;with bed alarm set  OT Visit Diagnosis: Pain;Unsteadiness on feet (R26.81);Other abnormalities of gait and mobility (R26.89) Pain - Right/Left: Left Pain - part of body: Leg                Time: 1610-9604 OT Time Calculation (min): 29  min Charges:  OT General Charges $OT Visit: 1 Visit OT Evaluation $OT Eval Moderate Complexity: 1 Mod OT Treatments $Therapeutic Activity: 8-22 mins  04/12/2023  AB, OTR/L  Acute Rehabilitation Services  Office: [phone]   Tristan Schroeder 04/12/2023, 11:28 AM

## 2023-04-12 NOTE — Progress Notes (Signed)
PT Cancellation Note  Patient Details Name: Edward Rocha. MRN: [id number] DOB: [birth year]   Cancelled Treatment:    Reason Eval/Treat Not Completed: Other (comment).  Refusing PT again, has been medicated.  States he is not getting up again today.  Reattempt as time and pt allow.   Ivar Drape 04/12/2023, 11:50 AM  Samul Dada, PT PhD Acute Rehab Dept. Number: [facility] [id number] and [facility] [phone]

## 2023-04-12 NOTE — Progress Notes (Signed)
PT Cancellation Note  Patient Details Name: Edward Rocha. MRN: [id number] DOB: 12/01/1965   Cancelled Treatment:    Reason Eval/Treat Not Completed: Pain limiting ability to participate.  Pt is in pain and will reattempt.  Nursing in to see him.   Ivar Drape 04/12/2023, 11:09 AM  Samul Dada, PT PhD Acute Rehab Dept. Number: [facility] [id number] and [facility] [phone]

## 2023-04-12 NOTE — Consult Note (Signed)
Physical Medicine and Rehabilitation Consult Reason for Consult: Evaluate appropriateness for Inpatient Rehab Referring Physician: Dr. Lajoyce Corners  HPI: Edward Rocha. is a 57 y.o. male with PMHx of  has a past medical history of Acute on chronic renal failure (HCC), COPD (chronic obstructive pulmonary disease) (HCC), Essential hypertension, GERD (gastroesophageal reflux disease), Hyperosmolar syndrome (2020), Hypothyroidism, Mild coronary artery disease, Nonischemic cardiomyopathy (HCC), PAD (peripheral artery disease) (HCC), Thrombocytopenia (HCC), and Type 2 diabetes mellitus (HCC). . They were admitted to [facility] on 04/11/2023 for left transtibial amputation due to gangrene status post revascularization.  Hospitalization has been complicated by hyponatremia, AKI, postop leukocytosis, and pain.  PM&R was consulted to evaluate appropriateness for IPR admission.   On evaluation, patient states he lives in a 1 story home with 1 STE. He lives alone, wife passed away recently, initially states no family support but then mentions daughter who checks in on him but is herself debilitated from lupus and niece who helps with food delivery. He has operated in his home at  [facility] level but had assistance with the front step at that time.   Review of Systems  Constitutional:  Negative for chills and fever.  Respiratory:  Negative for shortness of breath.   Cardiovascular:  Negative for chest pain.  Gastrointestinal:  Positive for constipation. Negative for nausea and vomiting.  Genitourinary:  Negative for frequency and urgency.  Musculoskeletal:  Negative for joint pain and myalgias.  Skin:  Positive for itching. Negative for rash.  Neurological:  Negative for sensory change and weakness.       Phantom pains  Psychiatric/Behavioral:  Negative for depression. The patient is not nervous/anxious and does not have insomnia.    Past Medical History:  Diagnosis Date   Acute on chronic renal  failure (HCC)    COPD (chronic obstructive pulmonary disease) (HCC)    Essential hypertension    GERD (gastroesophageal reflux disease)    Hyperosmolar syndrome 2020   Hypothyroidism    Mild coronary artery disease    Cardiac catheterization September 2022 - [facility]   Nonischemic cardiomyopathy [facility])    PAD (peripheral artery disease) (HCC)    Thrombocytopenia (HCC)    Type 2 diabetes mellitus (HCC)    Past Surgical History:  Procedure Laterality Date   ABDOMINAL AORTOGRAM W/LOWER EXTREMITY N/A 02/06/2019   Procedure: ABDOMINAL AORTOGRAM W/LOWER EXTREMITY;  Surgeon: Cephus Shelling, MD;  [facility]: [facility] INV[facility];  Service: Cardiovascular;  Laterality: N/A;  bilateral   ABDOMINAL AORTOGRAM W/LOWER EXTREMITY N/A 04/30/2019   Procedure: ABDOMINAL AORTOGRAM W/LOWER EXTREMITY;  Surgeon: Cephus Shelling, MD;  Location: [facility];  Service: Cardiovascular;  Laterality: N/A;   ABDOMINAL AORTOGRAM W/LOWER EXTREMITY N/A 02/28/2023   Procedure: ABDOMINAL AORTOGRAM W/LOWER EXTREMITY;  Surgeon: Cephus Shelling, MD;  Location: [facility];  Service: Cardiovascular;  Laterality: N/A;   AMPUTATION Left 04/11/2023   Procedure: LEFT BELOW KNEE AMPUTATION;  Surgeon: [name], MD;  Location: [facility] OR;  Service: Orthopedics;  Laterality: Left;   AMPUTATION TOE Left 03/05/2023   Procedure: AMPUTATION LEFT SECOND TOE;  Surgeon: Cephus Shelling, MD;  Location: [facility] OR;  Service: Vascular;  Laterality: Left;   AORTOGRAM  08/11/2019   Procedure: Aortogram;  Surgeon: Cephus Shelling, MD;  Location: [facility] OR;  Service: Vascular;;   CARDIAC CATHETERIZATION     ENDARTERECTOMY FEMORAL Left 03/05/2023   Procedure: LEFT COMMON FEMORAL ENDARTERECTOMY WITH PROFUNDOPLASTY AND PATCH ANGIOPLASTY;  Surgeon: Cephus Shelling, MD;  Location: [facility] OR;  Service: Vascular;  Laterality: Left;   ESOPHAGEAL BANDING  08/22/2019   Procedure: ESOPHAGEAL BANDING;  Surgeon: Rachael Fee,  MD;  Location: [facility] ENDOSCOPY;  Service: Endoscopy;;   ESOPHAGOGASTRODUODENOSCOPY N/A 08/22/2019   Procedure: ESOPHAGOGASTRODUODENOSCOPY (EGD);  Surgeon: Rachael Fee, MD;  Location: [facility] ENDOSCOPY;  Service: Endoscopy;  Laterality: N/A;   FEMORAL-POPLITEAL BYPASS GRAFT Left 08/11/2019   Procedure: BYPASS LEFT FEMORAL-DISTAL POPLITEAL ARTERY USING PROPATEN GRAFT;  Surgeon: Cephus Shelling, MD;  Location: [facility] OR;  Service: Vascular;  Laterality: Left;   FEMORAL-TIBIAL BYPASS GRAFT Left 03/05/2023   Procedure: LEFT COMMON FEMORAL ARTERY TO POSTERIOR TIBIAL ARTERY BYPASS USING PTFE;  Surgeon: Cephus Shelling, MD;  Location: [facility] OR;  Service: Vascular;  Laterality: Left;   I & D EXTREMITY Left 03/09/2023   Procedure: LEFT HEEL DEBRIDEMENT;  Surgeon: [name], MD;  Location: [facility] OR;  Service: Orthopedics;  Laterality: Left;   INSERTION OF DIALYSIS CATHETER Right 09/09/2019   Procedure: INSERTION OF DIALYSIS CATHETER;  Surgeon: Maeola Harman, MD;  Location: [facility] OR;  Service: Vascular;  Laterality: Right;   IRRIGATION AND DEBRIDEMENT FOOT Left 03/05/2023   Procedure: IRRIGATION AND DEBRIDEMENT LEFT HEEL WOUND;  Surgeon: Cephus Shelling, MD;  Location: [facility] OR;  Service: Vascular;  Laterality: Left;   MULTIPLE TOOTH EXTRACTIONS     PERIPHERAL VASCULAR INTERVENTION  02/06/2019   Procedure: PERIPHERAL VASCULAR INTERVENTION;  Surgeon: Cephus Shelling, MD;  [facility];  Service: Cardiovascular;;  Bilateral Iliacs   PERIPHERAL VASCULAR INTERVENTION  04/30/2019   Procedure: PERIPHERAL VASCULAR INTERVENTION;  Surgeon: Cephus Shelling, MD;  [facility]: [facility];  Service: Cardiovascular;;  Stent - Lt. Iliac    REMOVAL OF A DIALYSIS CATHETER Right 09/09/2019   Procedure: Removal Of A Dialysis Catheter;  Surgeon: Maeola Harman, MD;  Location: [facility] OR;  Service: Vascular;  Laterality: Right;   RIGHT HEART CATH N/A 12/12/2021   Procedure:  RIGHT HEART CATH;  Surgeon: Laurey Morale, MD;  Location: [facility] INVASIVE CV LAB;  Service: Cardiovascular;  Laterality: N/A;   ULTRASOUND GUIDANCE FOR VASCULAR ACCESS Right 09/09/2019   Procedure: Ultrasound Guidance For Vascular Access;  Surgeon: Maeola Harman, MD;  Location: [facility] OR;  Service: Vascular;  Laterality: Right;   VASCULAR SURGERY     Family History  Problem Relation Age of Onset   Alcohol abuse Father    Cancer Mother    Alcohol abuse Brother    Bipolar disorder Son    Bipolar disorder Daughter    Social History:  reports that he quit smoking about 4 years ago. His smoking use included cigarettes. He has a 36.00 pack-year smoking history. He has never been exposed to tobacco smoke. He has never used smokeless tobacco. He reports that he does not currently use alcohol. He reports that he does not use drugs. Allergies:  Allergies  Allergen Reactions   Other Other (See Comments)    Pt received platelets and had a bad reaction from infusion    Medications Prior to Admission  Medication Sig Dispense Refill   acetaminophen (TYLENOL) 500 MG tablet Take 1 tablet (500 mg total) by mouth daily as needed for moderate pain or headache. 30 tablet 0   aspirin EC 81 MG tablet Take 81 mg by mouth daily.     atorvastatin (LIPITOR) 80 MG tablet Take 1 tablet (80 mg total) by mouth daily. 30 tablet 0  ciprofloxacin (CIPRO) 500 MG tablet Take 1 tablet (500 mg total) by mouth 2 (two) times daily. 20 tablet 0   dapagliflozin propanediol (FARXIGA) 10 MG TABS tablet Take 1 tablet (10 mg total) by mouth daily. (Patient taking differently: Take 10 mg by mouth at bedtime.) 90 tablet 1   furosemide (LASIX) 40 MG tablet Take 1 tablet (40 mg total) by mouth daily. (Patient taking differently: Take 40 mg by mouth at bedtime.) 90 tablet 1   gabapentin (NEURONTIN) 400 MG capsule Take 1 capsule (400 mg total) by mouth 3 (three) times daily. (Patient taking differently: Take 300 mg by mouth 2 (two)  times daily.) 90 capsule 0   [EXPIRED] levofloxacin (LEVAQUIN) 750 MG tablet Take 1 tablet (750 mg total) by mouth daily. 30 tablet 0   metoprolol succinate (TOPROL XL) 50 MG 24 hr tablet Take 1 tablet (50 mg total) by mouth daily. (Patient taking differently: Take 25 mg by mouth at bedtime.) 30 tablet 6   nitroGLYCERIN (NITROSTAT) 0.4 MG SL tablet Place 0.4 mg under the tongue every 5 (five) minutes as needed for chest pain.     oxyCODONE-acetaminophen (PERCOCET/ROXICET) 5-325 MG tablet Take 1 tablet by mouth every 4 (four) hours as needed for severe pain. 30 tablet 0   rivaroxaban (XARELTO) 2.5 MG TABS tablet Take 1 tablet (2.5 mg total) by mouth 2 (two) times daily. 60 tablet 0   sacubitril-valsartan (ENTRESTO) 24-26 MG Take 1 tablet by mouth 2 (two) times daily. 60 tablet 6   spironolactone (ALDACTONE) 25 MG tablet Take 0.5 tablets (12.5 mg total) by mouth daily. (Patient taking differently: Take 25 mg by mouth at bedtime.) 45 tablet 1   [EXPIRED] amoxicillin-clavulanate (AUGMENTIN) 875-125 MG tablet Take 1 tablet by mouth 2 (two) times daily. (Patient not taking: Reported on 04/09/2023) 60 tablet 0   Elastic Bandages & Supports (KNEE SUPPORT/ELASTIC/FIRM MED) MISC 1 each by Does not apply route as directed. Medium pressure compression stockings Dx: leg edema 1 each 0    Home: Home Living Family/patient expects to be discharged to:: Private residence Living Arrangements: Alone Available Help at Discharge: Neighbor, Family, Available PRN/intermittently Type of Home: House Home Access: Stairs to enter Secretary/administrator of Steps: 1 Entrance Stairs-Rails: None Home Layout: One level Bathroom Shower/Tub: Engineer, manufacturing systems: Standard Home Equipment: Agricultural consultant (2 wheels), [employer] - single point, Hand held shower head, Wheelchair - manual, Wheelchair - power Additional Comments: Home setup from prior notes, Pt reports no change  Functional History: Prior Function Prior  Level of Function : Independent/Modified Independent, Driving Mobility Comments: Mod I RW; uses cane for community mobility ADLs Comments: Ind Functional Status:  Mobility: Bed Mobility Overal bed mobility: Needs Assistance Bed Mobility: Supine to Sit, Sit to Supine Supine to sit: Min guard Sit to supine: Min guard General bed mobility comments: Cueing to use bed rails prn to assist with bed mobility Transfers Overall transfer level: Needs assistance Equipment used: Rolling walker (2 wheels) Transfers: Sit to/from Stand Sit to Stand: Max assist, From elevated surface General transfer comment: STS x2 Max A but unable to clear bottom. Cues for pt to readjust hand placements on RW to use more UB strength while OT stabilized RW. STS Max A from elevated surface and adjusted hand placement      ADL: ADL Overall ADL's : Needs assistance/impaired Eating/Feeding: Independent, Bed level Grooming: Sitting, Min guard Upper Body Bathing: Sitting, Min guard Lower Body Bathing: Sitting/lateral leans, Maximal assistance Upper Body Dressing : Sitting, Min  guard Lower Body Dressing: Sitting/lateral leans, Maximal assistance Toilet Transfer: Maximal assistance Toileting- Clothing Manipulation and Hygiene: Maximal assistance Tub/ Shower Transfer: Maximal assistance Functional mobility during ADLs: Maximal assistance, Rolling walker (2 wheels) (stood at bedside)  Cognition: Cognition Overall Cognitive Status: Within Functional Limits for tasks assessed Orientation Level: Oriented X4 Cognition Arousal/Alertness: Awake/alert Behavior During Therapy: WFL for tasks assessed/performed Overall Cognitive Status: Within Functional Limits for tasks assessed  Blood pressure 124/76, pulse 85, temperature 97.8 F (36.6 C), temperature source Oral, resp. rate 16, height 6\' 1"  (1.854 m), weight 83.5 kg, SpO2 99 %. Physical Exam   PE: Constitution: Appropriate appearance for age. No apparent distress   Resp: No respiratory distress. No accessory muscle usage. on RA and CTAB Cardio: Well perfused appearance. No peripheral edema. Abdomen: Nondistended. Nontender.   Psych: Appropriate mood and affect. Neuro: AAOx4. No apparent cognitive deficits Skin: + R heel lateral callus overlying old ulcer + Wound vac L surgical site   Neurologic Exam:   DTRs: Reflexes were 2+ in bilateral achilles, patella,  Sensory exam: Decreased sensation in R foot to mid-shin Motor exam: strength 5/5 throughout bilateral upper extremities and bilateral lower extremities + LLE extension brace - HF, KE 5/5.  Coordination: Fine motor coordination was normal.       Results for orders placed or performed during the hospital encounter of 04/11/23 (from the past 24 hour(s))  Basic metabolic panel     Status: Abnormal   Collection Time: 04/12/23  7:33 AM  Result Value Ref Range   Sodium 125 (L) 135 - 145 mmol/L   Potassium 4.6 3.5 - 5.1 mmol/L   Chloride 96 (L) 98 - 111 mmol/L   CO2 20 (L) 22 - 32 mmol/L   Glucose, Bld 225 (H) 70 - 99 mg/dL   BUN 37 (H) 6 - 20 mg/dL   Creatinine, Ser 2.53 (H) 0.61 - 1.24 mg/dL   Calcium 8.5 (L) 8.9 - 10.3 mg/dL   GFR, Estimated >66 >44 mL/min   Anion gap 9 5 - 15  CBC     Status: Abnormal   Collection Time: 04/12/23  7:33 AM  Result Value Ref Range   WBC 17.3 (H) 4.0 - 10.5 K/uL   RBC 3.34 (L) 4.22 - 5.81 MIL/uL   Hemoglobin 8.9 (L) 13.0 - 17.0 g/dL   HCT 03.4 (L) 74.2 - 59.5 %   MCV 78.4 (L) 80.0 - 100.0 fL   MCH 26.6 26.0 - 34.0 pg   MCHC 34.0 30.0 - 36.0 g/dL   RDW 63.8 75.6 - 43.3 %   Platelets 180 150 - 400 K/uL   nRBC 0.0 0.0 - 0.2 %   No results found.  Assessment/Plan: Diagnosis: Left lower extremity BKA Does the need for close, 24 hr/day medical supervision in concert with the patient's rehab needs make it unreasonable for this patient to be served in a less intensive setting? Potentially Co-Morbidities requiring supervision/potential complications:  hyponatremia, AKI, post-op leukocytosis, wound vac, ABLA, pain management/phantom pains Due to safety, skin/wound care, disease management, medication administration, pain management, and patient education, does the patient require 24 hr/day rehab nursing? Yes Does the patient require coordinated care of a physician, rehab nurse, therapy disciplines of PT and OT to address physical and functional deficits in the context of the above medical diagnosis(es)? Potentially Addressing deficits in the following areas: balance, endurance, locomotion, strength, transferring, bathing, dressing, feeding, grooming, and toileting Can the patient actively participate in an intensive therapy program of at least 3  hrs of therapy per day at least 5 days per week? Yes The potential for patient to make measurable gains while on inpatient rehab is excellent Anticipated functional outcomes upon discharge from inpatient rehab are modified independent  with PT, modified independent with OT Estimated rehab length of stay to reach the above functional goals is: 5-7 days Anticipated discharge destination: Home Overall Rehab/Functional Prognosis: excellent  POST ACUTE RECOMMENDATIONS: This patient's condition is appropriate for continued rehabilitative care in the following setting: Tentative IRF; pending PT evaluation Patient has agreed to participate in recommended program. Potentially Note that insurance prior authorization may be required for reimbursement for recommended care.  Comment: Patient barriers to discharge home at this time are NWB LLE with 1 STE home and endorsing no family support, no ramp. He does indicate partial support from a few family members (daughter, niece) who may be able to assist with essential items like food delivery and transport to appointments. He may be able to return home with family support if he is able to navigate front step either with family assistance or adaptive technique with  therapies, which could be obtained at IRF. If patient is agreeable and can get at least partial family support, he would be an excellent IPR candidate.    MEDICAL RECOMMENDATIONS: Continue pain management with Gabapentin 300 mg BID and PRN oxycodone/dilaudid; recommend pre-medicating with PRN 1 hour prior to PT/OT sessions to maximize pain control for participation Recommend adding scheduled miralax for bowel regimen + [id number] QHS   I have personally performed a face to face diagnostic evaluation of this patient. Additionally, I have examined the patient's medical record including any pertinent labs and radiographic images. If the physician assistant has documented in this note, I have reviewed and edited or otherwise concur with the physician assistant's documentation.  Thanks,  Angelina Sheriff, DO 04/12/2023

## 2023-04-13 LAB — CBC
HCT: 26.8 % — ABNORMAL LOW (ref 39.0–52.0)
Hemoglobin: 9 g/dL — ABNORMAL LOW (ref 13.0–17.0)
MCH: 26.7 pg (ref 26.0–34.0)
MCHC: 33.6 g/dL (ref 30.0–36.0)
MCV: 79.5 fL — ABNORMAL LOW (ref 80.0–100.0)
Platelets: 220 10*3/uL (ref 150–400)
RBC: 3.37 MIL/uL — ABNORMAL LOW (ref 4.22–5.81)
RDW: 15.2 % (ref 11.5–15.5)
WBC: 20.9 10*3/uL — ABNORMAL HIGH (ref 4.0–10.5)
nRBC: 0 % (ref 0.0–0.2)

## 2023-04-13 LAB — BASIC METABOLIC PANEL
Anion gap: 14 (ref 5–15)
BUN: 39 mg/dL — ABNORMAL HIGH (ref 6–20)
CO2: 14 mmol/L — ABNORMAL LOW (ref 22–32)
Calcium: 8.6 mg/dL — ABNORMAL LOW (ref 8.9–10.3)
Chloride: 98 mmol/L (ref 98–111)
Creatinine, Ser: 1.4 mg/dL — ABNORMAL HIGH (ref 0.61–1.24)
GFR, Estimated: 58 mL/min — ABNORMAL LOW (ref >60)
Glucose, Bld: 210 mg/dL — ABNORMAL HIGH (ref 70–99)
Potassium: 5.4 mmol/L — ABNORMAL HIGH (ref 3.5–5.1)
Sodium: 126 mmol/L — ABNORMAL LOW (ref 135–145)

## 2023-04-13 LAB — SURGICAL PATHOLOGY

## 2023-04-13 MED ORDER — DAPAGLIFLOZIN PROPANEDIOL 10 MG PO TABS
10.0000 mg | ORAL_TABLET | Freq: Every day | ORAL | Status: DC
  Administered 2023-04-14 – 2023-04-16 (×3): 10 mg via ORAL
  Filled 2023-04-13 (×3): qty 1

## 2023-04-13 NOTE — Care Management Important Message (Signed)
Important Message  Patient Details  Name: Edward Rocha. MRN: [id number] Date of Birth: 1966/02/06   Medicare Important Message Given:  Yes     Sherilyn Banker 04/13/2023, 4:28 PM

## 2023-04-13 NOTE — Evaluation (Signed)
Physical Therapy Evaluation Patient Details Name: Edward Rocha. MRN: [id number] DOB: [birth year] Today's Date: 04/13/2023  History of Present Illness  57 y.o. male admitted 6/12 with Gangrene Left Foot. S/P Lt BKA 6/12. PMH of GERD, HTN, COPD, Femoral bypass, CAD, DM II  Clinical Impression  Patient is s/p above surgery resulting in functional limitations due to the deficits listed below (see PT Problem List). Independent prior to admission. Min guard for bed mobility and min assist for sit<>stand and step pivot transfer. Pt declines initiating gait training. Suspect he will show good progression as he has good RLE and BIL UE strength demonstrated during transfer but will require encouragement to fully participate. Pt requests to "take it slow." Encouraged OOB frequently and increase participation with rehab to maximize outcomes. May benefit from CIR if willing to participate, otherwise consider SNF. Was initially interested in CIR when speaking with coordinator but now not as sure. Will continue to progress as tolerated. Patient will benefit from acute skilled PT to increase their independence and safety with mobility to facilitate discharge.        Recommendations for follow up therapy are one component of a multi-disciplinary discharge planning process, led by the attending physician.  Recommendations may be updated based on patient status, additional functional criteria and insurance authorization.     Assistance Recommended at Discharge Frequent or constant Supervision/Assistance  Patient can return home with the following  A lot of help with walking and/or transfers;A little help with bathing/dressing/bathroom;Assistance with cooking/housework;Assist for transportation;Help with stairs or ramp for entrance    Equipment Recommendations None recommended by PT  Recommendations for Other Services  Rehab consult    Functional Status Assessment Patient has had a recent decline in their  functional status and demonstrates the ability to make significant improvements in function in a reasonable and predictable amount of time.     Precautions / Restrictions Precautions Precautions: Fall Required Braces or Orthoses: Other Brace Other Brace: Ampushield (wound vac) Restrictions Weight Bearing Restrictions: Yes LLE Weight Bearing: Non weight bearing Other Position/Activity Restrictions: L BKA      Mobility  Bed Mobility Overal bed mobility: Needs Assistance Bed Mobility: Supine to Sit, Sit to Supine     Supine to sit: Min guard Sit to supine: Min guard   General bed mobility comments: Min guard for safety, cues for technique, no physical assist required. Helped with lines/leads/vac.    Transfers Overall transfer level: Needs assistance Equipment used: Rolling walker (2 wheels) Transfers: Sit to/from Stand, Bed to chair/wheelchair/BSC Sit to Stand: From elevated surface, Min assist   Step pivot transfers: Min assist       General transfer comment: Min assist for boost to stand from bed, cues for hand placement but not fully following instructions. Min assist for walker control with pivot to recliner. Cues for sequencing, shows adequate UE support to clear Rt foot with small step pivot.    Ambulation/Gait               General Gait Details: Declined  Stairs            Wheelchair Mobility    Modified Rankin (Stroke Patients Only)       Balance Overall balance assessment: Needs assistance Sitting-balance support: Feet supported Sitting balance-Leahy Scale: Fair     Standing balance support: Reliant on assistive device for balance, Bilateral upper extremity supported Standing balance-Leahy Scale: Poor Standing balance comment: Stood with min guard  Pertinent Vitals/Pain Pain Assessment Pain Assessment: Faces Faces Pain Scale: Hurts little more Pain Location: LLE Pain Descriptors / Indicators:  Aching, Operative site guarding Pain Intervention(s): Monitored during session, Repositioned    Home Living Family/patient expects to be discharged to:: Private residence Living Arrangements: Alone Available Help at Discharge: Neighbor;Family;Available PRN/intermittently Type of Home: House Home Access: Stairs to enter Entrance Stairs-Rails: None Entrance Stairs-Number of Steps: 1   Home Layout: One level Home Equipment: Agricultural consultant (2 wheels);Cane - single point;Hand held shower head;Wheelchair - Engineer, technical sales - power      Prior Function Prior Level of Function : Independent/Modified Independent;Driving             Mobility Comments: Mod I RW; uses cane for community mobility ADLs Comments: Ind     Hand Dominance   Dominant Hand: Right    Extremity/Trunk Assessment   Upper Extremity Assessment Upper Extremity Assessment: Defer to OT evaluation    Lower Extremity Assessment Lower Extremity Assessment: LLE deficits/detail LLE Deficits / Details: BKA, guarded, shrinker on LLE: Unable to fully assess due to pain       Communication   Communication: No difficulties  Cognition Arousal/Alertness: Awake/alert Behavior During Therapy: WFL for tasks assessed/performed Overall Cognitive Status: Within Functional Limits for tasks assessed                                          General Comments General comments (skin integrity, edema, etc.): Reviewed precautions, use of ampushield, skin checks, elevation, and not to rest with anything behind knee to prevent contracture.    Exercises     Assessment/Plan    PT Assessment Patient needs continued PT services  PT Problem List Decreased strength;Decreased range of motion;Decreased activity tolerance;Decreased balance;Decreased mobility;Decreased knowledge of use of DME;Decreased knowledge of precautions;Pain       PT Treatment Interventions DME instruction;Gait training;Functional mobility  training;Therapeutic activities;Therapeutic exercise;Balance training;Neuromuscular re-education;Patient/family education;Modalities;Wheelchair mobility training    PT Goals (Current goals can be found in the Care Plan section)  Acute Rehab PT Goals Patient Stated Goal: Take it slow PT Goal Formulation: With patient Time For Goal Achievement: 04/27/23 Potential to Achieve Goals: Good    Frequency Min 4X/week     Co-evaluation               AM-PAC PT "6 Clicks" Mobility  Outcome Measure Help needed turning from your back to your side while in a flat bed without using bedrails?: A Little Help needed moving from lying on your back to sitting on the side of a flat bed without using bedrails?: A Little Help needed moving to and from a bed to a chair (including a wheelchair)?: A Little Help needed standing up from a chair using your arms (e.g., wheelchair or bedside chair)?: A Little Help needed to walk in hospital room?: A Lot Help needed climbing 3-5 steps with a railing? : Total 6 Click Score: 15    End of Session Equipment Utilized During Treatment: Gait belt;Other (comment) (ampushield) Activity Tolerance: Patient tolerated treatment well;Other (comment) (Self limiting) Patient left: in chair;with call bell/phone within reach;with chair alarm set;with SCD's reapplied   PT Visit Diagnosis: Unsteadiness on feet (R26.81);Other abnormalities of gait and mobility (R26.89);Difficulty in walking, not elsewhere classified (R26.2);Pain Pain - Right/Left: Left Pain - part of body: Leg    Time: 0865-7846 PT Time Calculation (min) (ACUTE ONLY): 21 min  Charges:   PT Evaluation $PT Eval Low Complexity: 1 Low          Kathlyn Sacramento, PT, DPT [facility]  Rehabilitation Services Physical Therapist Office: [phone] Website: [email or web address]   Berton Mount 04/13/2023, 2:44 PM

## 2023-04-13 NOTE — Progress Notes (Signed)
Inpatient Rehab Admissions Coordinator:   Note therapy progress today.  We will f/u on Monday to see how he's doing.   Estill Dooms, PT, DPT Admissions Coordinator [phone] 04/13/23  2:57 PM

## 2023-04-13 NOTE — Progress Notes (Signed)
Inpatient Rehab Admissions Coordinator:   Awaiting participation with PT.  Will follow.   Estill Dooms, PT, DPT Admissions Coordinator [phone] 04/13/23  10:24 AM

## 2023-04-13 NOTE — Progress Notes (Signed)
Patient ID: Edward Rocha., male   DOB: 1966/07/09, 57 y.o.   MRN: [id number] Patient was resting comfortably.  The wound VAC was not plugged in.  The wound VAC was plugged in and the seal reestablished.  Discharge planning based on therapy recommendations..  Possible inpatient rehab.

## 2023-04-14 MED ORDER — DIPHENHYDRAMINE HCL 25 MG PO CAPS
25.0000 mg | ORAL_CAPSULE | Freq: Four times a day (QID) | ORAL | Status: DC | PRN
  Administered 2023-04-14 (×2): 25 mg via ORAL
  Filled 2023-04-14 (×2): qty 1

## 2023-04-14 NOTE — Progress Notes (Signed)
Patient ID: Edward Mealing., male   DOB: Jun 05, 1966, 57 y.o.   MRN: [id number] Patient is status post transtibial amputation.  There is no drainage in the wound VAC canister.  Plan for discharge to inpatient versus outpatient rehab.

## 2023-04-14 NOTE — Plan of Care (Signed)
  Problem: Education: Goal: Knowledge of the prescribed therapeutic regimen will improve Outcome: Progressing Goal: Ability to verbalize activity precautions or restrictions will improve Outcome: Progressing Goal: Understanding of discharge needs will improve Outcome: Progressing   Problem: Activity: Goal: Ability to perform//tolerate increased activity and mobilize with assistive devices will improve Outcome: Progressing   Problem: Clinical Measurements: Goal: Postoperative complications will be avoided or minimized Outcome: Progressing   Problem: Self-Care: Goal: Ability to meet self-care needs will improve Outcome: Progressing   Problem: Self-Concept: Goal: Ability to maintain and perform role responsibilities to the fullest extent possible will improve Outcome: Progressing   Problem: Pain Management: Goal: Pain level will decrease with appropriate interventions Outcome: Progressing   Problem: Skin Integrity: Goal: Demonstration of wound healing without infection will improve Outcome: Progressing   Problem: Education: Goal: Knowledge of General Education information will improve Description: Including pain rating scale, medication(s)/side effects and non-pharmacologic comfort measures Outcome: Progressing   Problem: Health Behavior/Discharge Planning: Goal: Ability to manage health-related needs will improve Outcome: Progressing   Problem: Clinical Measurements: Goal: Ability to maintain clinical measurements within normal limits will improve Outcome: Progressing Goal: Will remain free from infection Outcome: Progressing Goal: Diagnostic test results will improve Outcome: Progressing Goal: Respiratory complications will improve Outcome: Progressing Goal: Cardiovascular complication will be avoided Outcome: Progressing   Problem: Activity: Goal: Risk for activity intolerance will decrease Outcome: Progressing   Problem: Nutrition: Goal: Adequate nutrition  will be maintained Outcome: Progressing   Problem: Coping: Goal: Level of anxiety will decrease Outcome: Progressing   Problem: Elimination: Goal: Will not experience complications related to bowel motility Outcome: Progressing Goal: Will not experience complications related to urinary retention Outcome: Progressing   Problem: Pain Managment: Goal: General experience of comfort will improve Outcome: Progressing   Problem: Safety: Goal: Ability to remain free from injury will improve Outcome: Progressing   Problem: Skin Integrity: Goal: Risk for impaired skin integrity will decrease Outcome: Progressing   Problem: Education: Goal: Knowledge of General Education information will improve Description: Including pain rating scale, medication(s)/side effects and non-pharmacologic comfort measures Outcome: Progressing   Problem: Clinical Measurements: Goal: Ability to maintain clinical measurements within normal limits will improve Outcome: Progressing Goal: Will remain free from infection Outcome: Progressing Goal: Diagnostic test results will improve Outcome: Progressing Goal: Respiratory complications will improve Outcome: Progressing Goal: Cardiovascular complication will be avoided Outcome: Progressing   Problem: Activity: Goal: Risk for activity intolerance will decrease Outcome: Progressing   Problem: Nutrition: Goal: Adequate nutrition will be maintained Outcome: Progressing   Problem: Coping: Goal: Level of anxiety will decrease Outcome: Progressing   Problem: Pain Managment: Goal: General experience of comfort will improve Outcome: Progressing   Problem: Safety: Goal: Ability to remain free from injury will improve Outcome: Progressing   Problem: Skin Integrity: Goal: Risk for impaired skin integrity will decrease Outcome: Progressing

## 2023-04-14 NOTE — TOC Initial Note (Signed)
Transition of Care (TOC) - Initial/Assessment Note    Patient Details  Name: Edward Rocha. MRN: [id number] Date of Birth: Jul 06, 1966  Transition of Care [facility]) CM/SW Contact:    Leander Rams, LCSW Phone Number: 04/14/2023, 11:50 AM  Clinical Narrative:                 CSW spoke with pt regarding STR at SNF. Pt states he is only agreeable to go to SNF in [place], where he lives. Pt reports he takes care of his 59 year old granddaughter and expressed some hesitancy with staying at SNF but states he will give it a try. Pt reports his granddaughter is currently with her mom. Pt has previously been connected with [facility] and if he is unable to go the SNF of his choice [facility]) he would like to go home with [facility] services.   CSW will complete fl2 and fax out. TOC will continue to follow.   Expected Discharge Plan: Skilled Nursing Facility Barriers to Discharge: Continued Medical Work up   Patient Goals and CMS Choice            Expected Discharge Plan and Services     Post Acute Care Choice: Skilled Nursing Facility Living arrangements for the past 2 months: Single Family Home                                      Prior Living Arrangements/Services Living arrangements for the past 2 months: Single Family Home Lives with:: Self, Minor Children Patient language and need for interpreter reviewed:: No Do you feel safe going back to the place where you live?: Yes      Need for Family Participation in Patient Care: Yes (Comment) Care giver support system in place?: Yes (comment) Current home services: DME Criminal Activity/Legal Involvement Pertinent to Current Situation/Hospitalization: No - Comment as needed  Activities of Daily Living Home Assistive Devices/Equipment: Wheelchair, CBG Meter, Eyeglasses ADL Screening (condition at time of admission) Patient's cognitive ability adequate to safely complete daily activities?: Yes Is the patient deaf or have difficulty  hearing?: No Does the patient have difficulty seeing, even when wearing glasses/contacts?: No Does the patient have difficulty concentrating, remembering, or making decisions?: No Patient able to express need for assistance with ADLs?: Yes Does the patient have difficulty dressing or bathing?: No Independently performs ADLs?: Yes (appropriate for developmental age) Does the patient have difficulty walking or climbing stairs?: Yes Weakness of Legs: Both Weakness of Arms/Hands: None  Permission Sought/Granted Permission sought to share information with : Facility Industrial/product designer granted to share information with : Yes, Verbal Permission Granted  Share Information with NAME: Onalee Hua     Permission granted to share info w Relationship: Brother in Curator granted to share info w Contact Information: [phone]  Emotional Assessment Appearance:: Developmentally appropriate Attitude/Demeanor/Rapport: Engaged Affect (typically observed): Appropriate Orientation: : Oriented to Self, Oriented to Place, Oriented to  Time, Oriented to Situation Alcohol / Substance Use: Not Applicable Psych Involvement: No (comment)  Admission diagnosis:  S/P BKA (below knee amputation) unilateral, left (HCC) [O13.086] Patient Active Problem List   Diagnosis Date Noted   S/P BKA (below knee amputation) unilateral, left (HCC) 04/11/2023   Gangrene of left foot (HCC) 03/06/2023   Osteomyelitis of foot, left, acute (HCC) 03/06/2023   Diabetic neuropathy (HCC) 02/28/2023   Open wound of left foot 01/30/2022  Elevated TSH 12/12/2021   Mixed hyperlipidemia 12/07/2021   Stage 3a chronic kidney disease (CKD) (HCC) 12/07/2021   Congestive heart failure (HCC) 10/20/2021   COPD (chronic obstructive pulmonary disease) (HCC) 07/05/2021   Cirrhosis of liver (HCC) 10/18/2019   PVD (peripheral vascular disease) (HCC) 08/11/2019   Pancytopenia (HCC) 05/29/2019   Type 2 diabetes mellitus with  circulatory disorder (HCC) 05/29/2019   Critical lower limb ischemia (HCC) 04/22/2019   PCP:  Shelby Dubin, FNP Pharmacy:   [facility] [place], [place] - [address] [place] [address] Phone: [phone] Fax: [phone]  [facility] [address] [place] [address] Phone: [phone] Fax: [phone]     Social Determinants of Health (SDOH) Social History: SDOH Screenings   Food Insecurity: No Food Insecurity (02/27/2023)  Housing: Low Risk  (02/27/2023)  Transportation Needs: No Transportation Needs (02/27/2023)  Utilities: Not At Risk (02/27/2023)  Financial Resource Strain: Low Risk  (09/21/2019)  Physical Activity: Insufficiently Active (09/21/2019)  Social Connections: Moderately Isolated (09/21/2019)  Stress: No Stress Concern Present (09/21/2019)  Tobacco Use: Medium Risk (04/12/2023)   SDOH Interventions:     Readmission Risk Interventions    03/12/2023    4:29 PM 12/08/2021   11:19 AM  Readmission Risk Prevention Plan  Transportation Screening Complete Complete  PCP or Specialist Appt within 5-7 Days Complete   Home Care Screening Complete   Medication Review (RN CM) Complete     Oletta Lamas, MSW, LCSWA, LCASA Transitions of Care  Clinical Social Worker I

## 2023-04-14 NOTE — [place] FL2 (Signed)
[place] MEDICAID FL2 LEVEL OF CARE FORM     IDENTIFICATION  Patient Name: Edward Rocha. Birthdate: [birth year] Sex: male Admission Date (Current Location): 04/11/2023  [facility] and [place] Number:      Facility and Address:  [facility], [address], [place], [place] [address]      Provider Number: [id number]  Attending Physician Name and Address:  [name], MD  Relative Name and Phone Number:       Current Level of Care: Hospital Recommended Level of Care: Skilled Nursing Facility Prior Approval Number:    Date Approved/Denied:   PASRR Number: [id number]  Discharge Plan: SNF    Current Diagnoses: Patient Active Problem List   Diagnosis Date Noted   S/P BKA (below knee amputation) unilateral, left (HCC) 04/11/2023   Gangrene of left foot (HCC) 03/06/2023   Osteomyelitis of foot, left, acute (HCC) 03/06/2023   Diabetic neuropathy (HCC) 02/28/2023   Open wound of left foot 01/30/2022   Elevated TSH 12/12/2021   Mixed hyperlipidemia 12/07/2021   Stage 3a chronic kidney disease (CKD) (HCC) 12/07/2021   Congestive heart failure (HCC) 10/20/2021   COPD (chronic obstructive pulmonary disease) (HCC) 07/05/2021   Cirrhosis of liver (HCC) 10/18/2019   PVD (peripheral vascular disease) (HCC) 08/11/2019   Pancytopenia (HCC) 05/29/2019   Type 2 diabetes mellitus with circulatory disorder (HCC) 05/29/2019   Critical lower limb ischemia (HCC) 04/22/2019    Orientation RESPIRATION BLADDER Height & Weight     Self, Time, Situation, Place  Normal Continent Weight: 184 lb (83.5 kg) Height:  6\' 1"  (185.4 cm)  BEHAVIORAL SYMPTOMS/MOOD NEUROLOGICAL BOWEL NUTRITION STATUS      Continent Diet (See dc summary)  AMBULATORY STATUS COMMUNICATION OF NEEDS Skin   Extensive Assist Verbally Normal                       Personal Care Assistance Level of Assistance  Bathing, Feeding, Dressing Bathing Assistance: Limited assistance Feeding  assistance: Independent Dressing Assistance: Limited assistance     Functional Limitations Info  Sight, Hearing, Speech Sight Info: Adequate Hearing Info: Adequate Speech Info: Adequate    SPECIAL CARE FACTORS FREQUENCY  PT (By licensed PT), OT (By licensed OT)     PT Frequency: 5xweek OT Frequency: 5xweek            Contractures Contractures Info: Not present    Additional Factors Info  Code Status Code Status Info: Full             Current Medications (04/14/2023):  This is the current hospital active medication list Current Facility-Administered Medications  Medication Dose Route Frequency Provider Last Rate Last Admin   0.9 %  sodium chloride infusion   Intravenous Continuous [name], MD   Stopped at 04/12/23 0140   acetaminophen (TYLENOL) tablet 325-650 mg  325-650 mg Oral Q6H PRN [name], MD       alum & mag hydroxide-simeth (MAALOX/MYLANTA) 200-200-20 MG/5ML suspension 15-30 mL  15-30 mL Oral Q2H PRN [name], MD       ascorbic acid (VITAMIN C) tablet 1,000 mg  1,000 mg Oral Daily [name], MD   1,000 mg at 04/14/23 7846   aspirin EC tablet 81 mg  81 mg Oral Daily [name], MD   81 mg at 04/14/23 0942   atorvastatin (LIPITOR) tablet 80 mg  80 mg Oral Daily [name], MD   80  mg at 04/14/23 0942   bisacodyl (DULCOLAX) EC tablet 5 mg  5 mg Oral Daily PRN [name], MD       dapagliflozin propanediol (FARXIGA) tablet 10 mg  10 mg Oral Daily [name], MD   10 mg at 04/14/23 0942   docusate sodium (COLACE) capsule 100 mg  100 mg Oral Daily [name], MD   100 mg at 04/14/23 0942   furosemide (LASIX) tablet 40 mg  40 mg Oral Daily [name], MD   40 mg at 04/14/23 0942   gabapentin (NEURONTIN) capsule 300 mg  300 mg Oral BID [name], MD   300 mg at 04/14/23 0942   guaiFENesin-dextromethorphan (ROBITUSSIN DM) 100-10 MG/5ML syrup 15 mL  15 mL Oral Q4H PRN [name], MD       hydrALAZINE (APRESOLINE)  injection 5 mg  5 mg Intravenous Q20 Min PRN [name], MD       HYDROmorphone (DILAUDID) injection 0.5-1 mg  0.5-1 mg Intravenous Q4H PRN [name], MD       labetalol (NORMODYNE) injection 10 mg  10 mg Intravenous Q10 min PRN [name], MD       magnesium citrate solution 1 Bottle  1 Bottle Oral Once PRN [name], MD       magnesium sulfate IVPB 2 g 50 mL  2 g Intravenous Daily PRN [name], MD       metoprolol succinate (TOPROL-XL) 24 hr tablet 25 mg  25 mg Oral QHS [name], MD   25 mg at 04/13/23 2118   metoprolol tartrate (LOPRESSOR) injection 2-5 mg  2-5 mg Intravenous Q2H PRN [name], MD       nutrition supplement (JUVEN) (JUVEN) powder packet 1 packet  1 packet Oral BID BM [name], MD   1 packet at 04/14/23 0942   ondansetron (ZOFRAN) injection 4 mg  4 mg Intravenous Q6H PRN [name], MD       oxyCODONE (Oxy IR/ROXICODONE) immediate release tablet 10-15 mg  10-15 mg Oral Q4H PRN [name], MD   15 mg at 04/14/23 4098   oxyCODONE (Oxy IR/ROXICODONE) immediate release tablet 5-10 mg  5-10 mg Oral Q4H PRN [name], MD       pantoprazole (PROTONIX) EC tablet 40 mg  40 mg Oral Daily [name], MD   40 mg at 04/14/23 0942   phenol (CHLORASEPTIC) mouth spray 1 spray  1 spray Mouth/Throat PRN [name], MD       polyethylene glycol (MIRALAX / GLYCOLAX) packet 17 g  17 g Oral Daily PRN [name], MD   17 g at 04/14/23 0953   potassium chloride SA (KLOR-CON M) CR tablet 20-40 mEq  20-40 mEq Oral Daily PRN [name], MD       rivaroxaban Carlena Hurl) tablet 2.5 mg  2.5 mg Oral BID [name], MD   2.5 mg at 04/14/23 1191   zinc sulfate capsule 220 mg  220 mg Oral Daily [name], MD   220 mg at 04/14/23 4782     Discharge Medications: Please see discharge summary for a list of discharge medications.  Relevant Imaging Results:  Relevant Lab Results:   Additional Information SSN: [id number]  Oletta Lamas, MSW, [facility] Transitions of Care  Clinical Social Worker I

## 2023-04-15 LAB — CBC
HCT: 26.6 % — ABNORMAL LOW (ref 39.0–52.0)
Hemoglobin: 9 g/dL — ABNORMAL LOW (ref 13.0–17.0)
MCH: 27.4 pg (ref 26.0–34.0)
MCHC: 33.8 g/dL (ref 30.0–36.0)
MCV: 80.9 fL (ref 80.0–100.0)
Platelets: 211 10*3/uL (ref 150–400)
RBC: 3.29 MIL/uL — ABNORMAL LOW (ref 4.22–5.81)
RDW: 15.3 % (ref 11.5–15.5)
WBC: 9.8 10*3/uL (ref 4.0–10.5)
nRBC: 0.6 % — ABNORMAL HIGH (ref 0.0–0.2)

## 2023-04-15 LAB — BASIC METABOLIC PANEL
Anion gap: 8 (ref 5–15)
BUN: 53 mg/dL — ABNORMAL HIGH (ref 6–20)
CO2: 21 mmol/L — ABNORMAL LOW (ref 22–32)
Calcium: 8.6 mg/dL — ABNORMAL LOW (ref 8.9–10.3)
Chloride: 97 mmol/L — ABNORMAL LOW (ref 98–111)
Creatinine, Ser: 1.45 mg/dL — ABNORMAL HIGH (ref 0.61–1.24)
GFR, Estimated: 56 mL/min — ABNORMAL LOW (ref >60)
Glucose, Bld: 170 mg/dL — ABNORMAL HIGH (ref 70–99)
Potassium: 4.5 mmol/L (ref 3.5–5.1)
Sodium: 126 mmol/L — ABNORMAL LOW (ref 135–145)

## 2023-04-15 NOTE — Progress Notes (Signed)
Subjective: 4 Days Post-Op Procedure(s) (LRB): LEFT BELOW KNEE AMPUTATION (Left) Patient reports pain as mild.  Resting comfortable  Objective: Vital signs in last 24 hours: Temp:  [97.8 F (36.6 C)-98.1 F (36.7 C)] 98.1 F (36.7 C) (06/16 0459) Pulse Rate:  [74-87] 86 (06/16 0459) Resp:  [16-18] 16 (06/16 0459) BP: (150-158)/(70-97) 150/70 (06/16 0459) SpO2:  [99 %-100 %] 99 % (06/16 0459)  Intake/Output from previous day: 06/15 0701 - 06/16 0700 In: 480 [P.O.:480] Out: 1950 [Urine:1950] Intake/Output this shift: No intake/output data recorded.  Recent Labs    04/13/23 0123 04/15/23 0104  HGB 9.0* 9.0*   Recent Labs    04/13/23 0123 04/15/23 0104  WBC 20.9* 9.8  RBC 3.37* 3.29*  HCT 26.8* 26.6*  PLT 220 211   Recent Labs    04/13/23 0123 04/15/23 0104  NA 126* 126*  K 5.4* 4.5  CL 98 97*  CO2 14* 21*  BUN 39* 53*  CREATININE 1.40* 1.45*  GLUCOSE 210* 170*  CALCIUM 8.6* 8.6*   No results for input(s): "LABPT", "INR" in the last 72 hours.  Physical Exam Left BKA- wound vac in place with good seal.  No fluid in canister D/c dispo- sounds like agreed to SNF yesterday, but telling me today that he wants to go home   Assessment/Plan: 4 Days Post-Op Procedure(s) (LRB): LEFT BELOW KNEE AMPUTATION (Left)       Cristie Hem 04/15/2023, 9:27 AM

## 2023-04-15 NOTE — Plan of Care (Signed)
  Problem: Pain Managment: Goal: General experience of comfort will improve Outcome: Progressing   Problem: Safety: Goal: Ability to remain free from injury will improve Outcome: Progressing   Problem: Pain Managment: Goal: General experience of comfort will improve Outcome: Progressing   Problem: Safety: Goal: Ability to remain free from injury will improve Outcome: Progressing   Problem: Skin Integrity: Goal: Risk for impaired skin integrity will decrease Outcome: Progressing   

## 2023-04-16 MED ORDER — OXYCODONE-ACETAMINOPHEN 5-325 MG PO TABS: 1.0000 | ORAL_TABLET | ORAL | 0 refills | Status: DC | PRN

## 2023-04-16 NOTE — Discharge Summary (Signed)
Discharge Diagnoses:  Principal Problem:   S/P BKA (below knee amputation) unilateral, left (HCC)   Surgeries: Procedure(s): LEFT BELOW KNEE AMPUTATION on 04/11/2023    Consultants:   Discharged Condition: Improved  Hospital Course: Edward Rocha. is an 57 y.o. male who was admitted 04/11/2023 with a chief complaint of gangrene left foot, with a final diagnosis of Gangrene Left Foot.  Patient was brought to the operating room on 04/11/2023 and underwent Procedure(s): LEFT BELOW KNEE AMPUTATION.    Patient was given perioperative antibiotics:  Anti-infectives (From admission, onward)    Start     Dose/Rate Route Frequency Ordered Stop   04/11/23 1800  ceFAZolin (ANCEF) IVPB 2g/100 mL premix        2 g 200 mL/hr over 30 Minutes Intravenous Every 8 hours 04/11/23 1238 04/12/23 0210   04/11/23 0845  ceFAZolin (ANCEF) IVPB 2g/100 mL premix        2 g 200 mL/hr over 30 Minutes Intravenous On call to O.R. 04/11/23 1610 04/11/23 1034     .  Patient was given sequential compression devices, early ambulation, and aspirin for DVT prophylaxis.  Recent vital signs: Patient Vitals for the past 24 hrs:  BP Temp Temp src Pulse Resp SpO2  04/16/23 0729 (!) 160/74 98.4 F (36.9 C) -- 84 17 100 %  04/16/23 0509 116/80 98.8 F (37.1 C) -- 78 18 100 %  04/15/23 2036 (!) 177/75 99 F (37.2 C) -- 88 17 100 %  04/15/23 1737 (!) 168/89 (!) 97.5 F (36.4 C) Oral 83 16 100 %  .  Recent laboratory studies: No results found.  Discharge Medications:   Allergies as of 04/16/2023       Reactions   Other Other (See Comments)   Pt received platelets and had a bad reaction from infusion         Medication List     STOP taking these medications    amoxicillin-clavulanate 875-125 MG tablet Commonly known as: AUGMENTIN   ciprofloxacin 500 MG tablet Commonly known as: CIPRO   levofloxacin 750 MG tablet Commonly known as: Levaquin       TAKE these medications    acetaminophen 500 MG  tablet Commonly known as: TYLENOL Take 1 tablet (500 mg total) by mouth daily as needed for moderate pain or headache.   aspirin EC 81 MG tablet Take 81 mg by mouth daily.   atorvastatin 80 MG tablet Commonly known as: LIPITOR Take 1 tablet (80 mg total) by mouth daily.   dapagliflozin propanediol 10 MG Tabs tablet Commonly known as: FARXIGA Take 1 tablet (10 mg total) by mouth daily. What changed: when to take this   Entresto 24-26 MG Generic drug: sacubitril-valsartan Take 1 tablet by mouth 2 (two) times daily.   furosemide 40 MG tablet Commonly known as: LASIX Take 1 tablet (40 mg total) by mouth daily. What changed: when to take this   gabapentin 400 MG capsule Commonly known as: Neurontin Take 1 capsule (400 mg total) by mouth 3 (three) times daily. What changed:  how much to take when to take this   Knee Support/Elastic/Firm Med Misc 1 each by Does not apply route as directed. Medium pressure compression stockings Dx: leg edema   metoprolol succinate 50 MG 24 hr tablet Commonly known as: Toprol XL Take 1 tablet (50 mg total) by mouth daily. What changed:  how much to take when to take this   nitroGLYCERIN 0.4 MG SL tablet Commonly known as: NITROSTAT  Place 0.4 mg under the tongue every 5 (five) minutes as needed for chest pain.   oxyCODONE-acetaminophen 5-325 MG tablet Commonly known as: PERCOCET/ROXICET Take 1 tablet by mouth every 4 (four) hours as needed. What changed: reasons to take this   oxyCODONE-acetaminophen 5-325 MG tablet Commonly known as: PERCOCET/ROXICET Take 1 tablet by mouth every 4 (four) hours as needed. What changed: You were already taking a medication with the same name, and this prescription was added. Make sure you understand how and when to take each.   spironolactone 25 MG tablet Commonly known as: ALDACTONE Take 0.5 tablets (12.5 mg total) by mouth daily. What changed:  how much to take when to take this   Xarelto 2.5 MG  Tabs tablet Generic drug: rivaroxaban Take 1 tablet (2.5 mg total) by mouth 2 (two) times daily.        Diagnostic Studies: No results found.  Patient benefited maximally from their hospital stay and there were no complications.     Disposition: Discharge disposition: 62-Rehab Facility      Discharge Instructions     Call MD / Call 911   Complete by: As directed    If you experience chest pain or shortness of breath, CALL 911 and be transported to the hospital emergency room.  If you develope a fever above 101 F, pus (white drainage) or increased drainage or redness at the wound, or calf pain, call your surgeon's office.   Call MD / Call 911   Complete by: As directed    If you experience chest pain or shortness of breath, CALL 911 and be transported to the hospital emergency room.  If you develope a fever above 101 F, pus (white drainage) or increased drainage or redness at the wound, or calf pain, call your surgeon's office.   Constipation Prevention   Complete by: As directed    Drink plenty of fluids.  Prune juice may be helpful.  You may use a stool softener, such as Colace (over the counter) 100 mg twice a day.  Use MiraLax (over the counter) for constipation as needed.   Constipation Prevention   Complete by: As directed    Drink plenty of fluids.  Prune juice may be helpful.  You may use a stool softener, such as Colace (over the counter) 100 mg twice a day.  Use MiraLax (over the counter) for constipation as needed.   Diet - low sodium heart healthy   Complete by: As directed    Diet - low sodium heart healthy   Complete by: As directed    Increase activity slowly as tolerated   Complete by: As directed    Increase activity slowly as tolerated   Complete by: As directed    Negative Pressure Wound Therapy - Incisional   Complete by: As directed    Patient to discharge with the Praveena plus portable wound VAC pump.  Please plug this and to maintain its charge.  The  wound VAC will last 1 week.  When the wound VAC stops working please remove the wound VAC and apply dry dressing.   Post-operative opioid taper instructions:   Complete by: As directed    POST-OPERATIVE OPIOID TAPER INSTRUCTIONS: It is important to wean off of your opioid medication as soon as possible. If you do not need pain medication after your surgery it is ok to stop day one. Opioids include: Codeine, Hydrocodone(Norco, Vicodin), Oxycodone(Percocet, oxycontin) and hydromorphone amongst others.  Long term and even short term  use of opiods can cause: Increased pain response Dependence Constipation Depression Respiratory depression And more.  Withdrawal symptoms can include Flu like symptoms Nausea, vomiting And more Techniques to manage these symptoms Hydrate well Eat regular healthy meals Stay active Use relaxation techniques(deep breathing, meditating, yoga) Do Not substitute Alcohol to help with tapering If you have been on opioids for less than two weeks and do not have pain than it is ok to stop all together.  Plan to wean off of opioids This plan should start within one week post op of your joint replacement. Maintain the same interval or time between taking each dose and first decrease the dose.  Cut the total daily intake of opioids by one tablet each day Next start to increase the time between doses. The last dose that should be eliminated is the evening dose.      Post-operative opioid taper instructions:   Complete by: As directed    POST-OPERATIVE OPIOID TAPER INSTRUCTIONS: It is important to wean off of your opioid medication as soon as possible. If you do not need pain medication after your surgery it is ok to stop day one. Opioids include: Codeine, Hydrocodone(Norco, Vicodin), Oxycodone(Percocet, oxycontin) and hydromorphone amongst others.  Long term and even short term use of opiods can cause: Increased pain  response Dependence Constipation Depression Respiratory depression And more.  Withdrawal symptoms can include Flu like symptoms Nausea, vomiting And more Techniques to manage these symptoms Hydrate well Eat regular healthy meals Stay active Use relaxation techniques(deep breathing, meditating, yoga) Do Not substitute Alcohol to help with tapering If you have been on opioids for less than two weeks and do not have pain than it is ok to stop all together.  Plan to wean off of opioids This plan should start within one week post op of your joint replacement. Maintain the same interval or time between taking each dose and first decrease the dose.  Cut the total daily intake of opioids by one tablet each day Next start to increase the time between doses. The last dose that should be eliminated is the evening dose.          Follow-up Information     [name], MD Follow up in 1 week(s).   Specialty: Orthopedic Surgery Contact information: [address] [place] [address] [phone]                  Signed: [name] 04/16/2023, 12:08 PM

## 2023-04-16 NOTE — Plan of Care (Signed)
  Problem: Education: Goal: Knowledge of the prescribed therapeutic regimen will improve Outcome: Progressing Goal: Ability to verbalize activity precautions or restrictions will improve Outcome: Progressing Goal: Understanding of discharge needs will improve Outcome: Progressing   Problem: Self-Care: Goal: Ability to meet self-care needs will improve Outcome: Progressing   Problem: Pain Management: Goal: Pain level will decrease with appropriate interventions Outcome: Progressing   Problem: Skin Integrity: Goal: Demonstration of wound healing without infection will improve Outcome: Progressing   Problem: Activity: Goal: Risk for activity intolerance will decrease Outcome: Progressing   Problem: Pain Managment: Goal: General experience of comfort will improve Outcome: Progressing   Problem: Safety: Goal: Ability to remain free from injury will improve Outcome: Progressing

## 2023-04-16 NOTE — Care Management Important Message (Signed)
Important Message  Patient Details  Name: Edward Rocha. MRN: [id number] Date of Birth: 1966-01-31   Medicare Important Message Given:  Yes     Sherilyn Banker 04/16/2023, 4:12 PM

## 2023-04-16 NOTE — Progress Notes (Signed)
Report given to [place] at [facility]

## 2023-04-16 NOTE — Discharge Summary (Signed)
Discharge Diagnoses:  Principal Problem:   S/P BKA (below knee amputation) unilateral, left (HCC)   Surgeries: Procedure(s): LEFT BELOW KNEE AMPUTATION on 04/11/2023    Consultants:   Discharged Condition: Improved  Hospital Course: Edward Rocha. is an 57 y.o. male who was admitted 04/11/2023 with a chief complaint of gangrene left foot, with a final diagnosis of Gangrene Left Foot.  Patient was brought to the operating room on 04/11/2023 and underwent Procedure(s): LEFT BELOW KNEE AMPUTATION.    Patient was given perioperative antibiotics:  Anti-infectives (From admission, onward)    Start     Dose/Rate Route Frequency Ordered Stop   04/11/23 1800  ceFAZolin (ANCEF) IVPB 2g/100 mL premix        2 g 200 mL/hr over 30 Minutes Intravenous Every 8 hours 04/11/23 1238 04/12/23 0210   04/11/23 0845  ceFAZolin (ANCEF) IVPB 2g/100 mL premix        2 g 200 mL/hr over 30 Minutes Intravenous On call to O.R. 04/11/23 8657 04/11/23 1034     .  Patient was given sequential compression devices, early ambulation, and aspirin for DVT prophylaxis.  Recent vital signs: Patient Vitals for the past 24 hrs:  BP Temp Temp src Pulse Resp SpO2  04/16/23 0729 (!) 160/74 98.4 F (36.9 C) -- 84 17 100 %  04/16/23 0509 116/80 98.8 F (37.1 C) -- 78 18 100 %  04/15/23 2036 (!) 177/75 99 F (37.2 C) -- 88 17 100 %  04/15/23 1737 (!) 168/89 (!) 97.5 F (36.4 C) Oral 83 16 100 %  .  Recent laboratory studies: No results found.  Discharge Medications:   Allergies as of 04/16/2023       Reactions   Other Other (See Comments)   Pt received platelets and had a bad reaction from infusion         Medication List     STOP taking these medications    amoxicillin-clavulanate 875-125 MG tablet Commonly known as: AUGMENTIN   ciprofloxacin 500 MG tablet Commonly known as: CIPRO   levofloxacin 750 MG tablet Commonly known as: Levaquin       TAKE these medications    acetaminophen 500 MG  tablet Commonly known as: TYLENOL Take 1 tablet (500 mg total) by mouth daily as needed for moderate pain or headache.   aspirin EC 81 MG tablet Take 81 mg by mouth daily.   atorvastatin 80 MG tablet Commonly known as: LIPITOR Take 1 tablet (80 mg total) by mouth daily.   dapagliflozin propanediol 10 MG Tabs tablet Commonly known as: FARXIGA Take 1 tablet (10 mg total) by mouth daily. What changed: when to take this   Entresto 24-26 MG Generic drug: sacubitril-valsartan Take 1 tablet by mouth 2 (two) times daily.   furosemide 40 MG tablet Commonly known as: LASIX Take 1 tablet (40 mg total) by mouth daily. What changed: when to take this   gabapentin 400 MG capsule Commonly known as: Neurontin Take 1 capsule (400 mg total) by mouth 3 (three) times daily. What changed:  how much to take when to take this   Knee Support/Elastic/Firm Med Misc 1 each by Does not apply route as directed. Medium pressure compression stockings Dx: leg edema   metoprolol succinate 50 MG 24 hr tablet Commonly known as: Toprol XL Take 1 tablet (50 mg total) by mouth daily. What changed:  how much to take when to take this   nitroGLYCERIN 0.4 MG SL tablet Commonly known as: NITROSTAT  Place 0.4 mg under the tongue every 5 (five) minutes as needed for chest pain.   oxyCODONE-acetaminophen 5-325 MG tablet Commonly known as: PERCOCET/ROXICET Take 1 tablet by mouth every 4 (four) hours as needed. What changed: reasons to take this   oxyCODONE-acetaminophen 5-325 MG tablet Commonly known as: PERCOCET/ROXICET Take 1 tablet by mouth every 4 (four) hours as needed. What changed: You were already taking a medication with the same name, and this prescription was added. Make sure you understand how and when to take each.   spironolactone 25 MG tablet Commonly known as: ALDACTONE Take 0.5 tablets (12.5 mg total) by mouth daily. What changed:  how much to take when to take this   Xarelto 2.5 MG  Tabs tablet Generic drug: rivaroxaban Take 1 tablet (2.5 mg total) by mouth 2 (two) times daily.        Diagnostic Studies: No results found.  Patient benefited maximally from their hospital stay and there were no complications.     Disposition: Discharge disposition: 62-Rehab Facility      Discharge Instructions     Call MD / Call 911   Complete by: As directed    If you experience chest pain or shortness of breath, CALL 911 and be transported to the hospital emergency room.  If you develope a fever above 101 F, pus (white drainage) or increased drainage or redness at the wound, or calf pain, call your surgeon's office.   Call MD / Call 911   Complete by: As directed    If you experience chest pain or shortness of breath, CALL 911 and be transported to the hospital emergency room.  If you develope a fever above 101 F, pus (white drainage) or increased drainage or redness at the wound, or calf pain, call your surgeon's office.   Constipation Prevention   Complete by: As directed    Drink plenty of fluids.  Prune juice may be helpful.  You may use a stool softener, such as Colace (over the counter) 100 mg twice a day.  Use MiraLax (over the counter) for constipation as needed.   Constipation Prevention   Complete by: As directed    Drink plenty of fluids.  Prune juice may be helpful.  You may use a stool softener, such as Colace (over the counter) 100 mg twice a day.  Use MiraLax (over the counter) for constipation as needed.   Diet - low sodium heart healthy   Complete by: As directed    Diet - low sodium heart healthy   Complete by: As directed    Increase activity slowly as tolerated   Complete by: As directed    Increase activity slowly as tolerated   Complete by: As directed    Post-operative opioid taper instructions:   Complete by: As directed    POST-OPERATIVE OPIOID TAPER INSTRUCTIONS: It is important to wean off of your opioid medication as soon as possible. If  you do not need pain medication after your surgery it is ok to stop day one. Opioids include: Codeine, Hydrocodone(Norco, Vicodin), Oxycodone(Percocet, oxycontin) and hydromorphone amongst others.  Long term and even short term use of opiods can cause: Increased pain response Dependence Constipation Depression Respiratory depression And more.  Withdrawal symptoms can include Flu like symptoms Nausea, vomiting And more Techniques to manage these symptoms Hydrate well Eat regular healthy meals Stay active Use relaxation techniques(deep breathing, meditating, yoga) Do Not substitute Alcohol to help with tapering If you have been on opioids for  less than two weeks and do not have pain than it is ok to stop all together.  Plan to wean off of opioids This plan should start within one week post op of your joint replacement. Maintain the same interval or time between taking each dose and first decrease the dose.  Cut the total daily intake of opioids by one tablet each day Next start to increase the time between doses. The last dose that should be eliminated is the evening dose.      Post-operative opioid taper instructions:   Complete by: As directed    POST-OPERATIVE OPIOID TAPER INSTRUCTIONS: It is important to wean off of your opioid medication as soon as possible. If you do not need pain medication after your surgery it is ok to stop day one. Opioids include: Codeine, Hydrocodone(Norco, Vicodin), Oxycodone(Percocet, oxycontin) and hydromorphone amongst others.  Long term and even short term use of opiods can cause: Increased pain response Dependence Constipation Depression Respiratory depression And more.  Withdrawal symptoms can include Flu like symptoms Nausea, vomiting And more Techniques to manage these symptoms Hydrate well Eat regular healthy meals Stay active Use relaxation techniques(deep breathing, meditating, yoga) Do Not substitute Alcohol to help with  tapering If you have been on opioids for less than two weeks and do not have pain than it is ok to stop all together.  Plan to wean off of opioids This plan should start within one week post op of your joint replacement. Maintain the same interval or time between taking each dose and first decrease the dose.  Cut the total daily intake of opioids by one tablet each day Next start to increase the time between doses. The last dose that should be eliminated is the evening dose.          Follow-up Information     [name], MD Follow up in 1 week(s).   Specialty: Orthopedic Surgery Contact information: [address] [place] [address] [phone]                  Signed: [name] 04/16/2023, 11:51 AM

## 2023-04-16 NOTE — TOC Transition Note (Signed)
Transition of Care [facility]) - CM/SW Discharge Note   Patient Details  Name: Edward Rocha. MRN: [id number] Date of Birth: 1966-07-15  [facility] CM/SW Contact:  Erin Sons, LCSW Phone Number: 04/16/2023, 12:28 PM   Clinical Narrative:     Patient will DC to: [facility] Anticipated DC date: 04/16/23 Transport by: Sharin Mons   Per MD patient ready for DC to [facility]. RN, patient, and facility notified of DC. Discharge Summary and FL2 sent to facility. RN to call report prior to discharge [phone] Room 108 ). DC packet on chart. Ambulance transport requested for patient.   CSW will sign off for now as social work intervention is no longer needed. Please consult [place] again if new needs arise.   Final next level of care: Skilled Nursing Facility Barriers to Discharge: No Barriers Identified   Patient Goals and CMS Choice      Discharge Placement                Patient chooses bed at: [facility] Patient to be transferred to facility by: [facility]   Patient and family notified of of transfer: 04/16/23  Discharge Plan and Services Additional resources added to the After Visit Summary for       Post Acute Care Choice: Skilled Nursing Facility                               Social Determinants of Health (SDOH) Interventions SDOH Screenings   Food Insecurity: No Food Insecurity (02/27/2023)  Housing: Low Risk  (02/27/2023)  Transportation Needs: No Transportation Needs (02/27/2023)  Utilities: Not At Risk (02/27/2023)  Financial Resource Strain: Low Risk  (09/21/2019)  Physical Activity: Insufficiently Active (09/21/2019)  Social Connections: Moderately Isolated (09/21/2019)  Stress: No Stress Concern Present (09/21/2019)  Tobacco Use: Medium Risk (04/12/2023)     Readmission Risk Interventions    03/12/2023    4:29 PM 12/08/2021   11:19 AM  Readmission Risk Prevention Plan  Transportation Screening Complete Complete  PCP or Specialist Appt  within 5-7 Days Complete   Home Care Screening Complete   Medication Review (RN CM) Complete

## 2023-04-16 NOTE — Progress Notes (Signed)
Occupational Therapy Treatment Patient Details Name: Edward Rocha. MRN: [id number] DOB: 01-Oct-1966 Today's Date: 04/16/2023   History of present illness 57 y.o. male admitted 6/12 with Gangrene Left Foot. S/P Lt BKA 6/12. PMH of GERD, HTN, COPD, Femoral bypass, CAD, DM II   OT comments  Patient with less expressed pain and improved mobility at RW level.  Patient still needing Mod A for lower body dress, but has progressed to a sit to stand level from lateral leans.  Patient also able to stand and take steps to/from bed/recliner to simulate bedside commode transfers.  Overall needing Min A from an elevated surface, and closer to Mod A from recliner.  OT will continue efforts in the acute setting, and Patient will benefit from continued inpatient follow up therapy, <3 hours/day    Recommendations for follow up therapy are one component of a multi-disciplinary discharge planning process, led by the attending physician.  Recommendations may be updated based on patient status, additional functional criteria and insurance authorization.    Assistance Recommended at Discharge Frequent or constant Supervision/Assistance  Patient can return home with the following  Two people to help with walking and/or transfers;A lot of help with bathing/dressing/bathroom;Assistance with cooking/housework;Assist for transportation;Help with stairs or ramp for entrance   Equipment Recommendations  None recommended by OT    Recommendations for Other Services      Precautions / Restrictions Precautions Precautions: Fall Required Braces or Orthoses: Other Brace Other Brace: Limb protector Restrictions Weight Bearing Restrictions: Yes LLE Weight Bearing: Non weight bearing Other Position/Activity Restrictions: L BKA       Mobility Bed Mobility                    Transfers Overall transfer level: Needs assistance Equipment used: Rolling walker (2 wheels) Transfers: Sit to/from Stand Sit to  Stand: Min assist, Mod assist                 Balance Overall balance assessment: Needs assistance Sitting-balance support: Feet supported Sitting balance-Leahy Scale: Good     Standing balance support: Reliant on assistive device for balance Standing balance-Leahy Scale: Poor                             ADL either performed or assessed with clinical judgement   ADL       Grooming: Sitting;Min guard;Standing           Upper Body Dressing : Sitting;Min guard   Lower Body Dressing: Sit to/from stand;Moderate assistance   Toilet Transfer: Minimal assistance;Stand-pivot;BSC/3in1                  Extremity/Trunk Assessment Upper Extremity Assessment Upper Extremity Assessment: Overall WFL for tasks assessed   Lower Extremity Assessment Lower Extremity Assessment: Defer to PT evaluation   Cervical / Trunk Assessment Cervical / Trunk Assessment: Normal    Vision Patient Visual Report: No change from baseline     Perception Perception Perception: Not tested   Praxis Praxis Praxis: Not tested    Cognition Arousal/Alertness: Awake/alert Behavior During Therapy: Flat affect Overall Cognitive Status: Within Functional Limits for tasks assessed  Pertinent Vitals/ Pain       Pain Assessment Pain Assessment: 0-10 Pain Score: 3  Pain Descriptors / Indicators: Tender Pain Intervention(s): Monitored during session                                                          Frequency  Min 2X/week        Progress Toward Goals  OT Goals(current goals can now be found in the care plan section)  Progress towards OT goals: Progressing toward goals  Acute Rehab OT Goals OT Goal Formulation: With patient Time For Goal Achievement: 04/26/23 Potential to Achieve Goals: Good  Plan Discharge plan needs to be updated     Co-evaluation                 AM-PAC OT "6 Clicks" Daily Activity     Outcome Measure   Help from another person eating meals?: None Help from another person taking care of personal grooming?: A Little Help from another person toileting, which includes using toliet, bedpan, or urinal?: A Lot Help from another person bathing (including washing, rinsing, drying)?: A Lot Help from another person to put on and taking off regular upper body clothing?: None Help from another person to put on and taking off regular lower body clothing?: A Lot 6 Click Score: 17    End of Session Equipment Utilized During Treatment: Gait belt;Rolling walker (2 wheels)  OT Visit Diagnosis: Pain;Unsteadiness on feet (R26.81);Other abnormalities of gait and mobility (R26.89) Pain - Right/Left: Left Pain - part of body: Leg   Activity Tolerance Patient tolerated treatment well   Patient Left in chair;with call bell/phone within reach   Nurse Communication Mobility status        Time: 1205-1222 OT Time Calculation (min): 17 min  Charges: OT General Charges $OT Visit: 1 Visit OT Treatments $Self Care/Home Management : 8-22 mins  04/16/2023  RP, OTR/L  Acute Rehabilitation Services  Office:  [phone]   Suzanna Obey 04/16/2023, 1:02 PM

## 2023-04-16 NOTE — Progress Notes (Signed)
Inpatient Rehab Admissions Coordinator:   Note plan for d/c to SNF today.  Will sign off.   Estill Dooms, PT, DPT Admissions Coordinator [phone] 04/16/23  12:40 PM

## 2023-04-16 NOTE — Progress Notes (Signed)
Physical Therapy Treatment Patient Details Name: Edward Rocha. MRN: [id number] DOB: Apr 08, 1966 Today's Date: 04/16/2023   History of Present Illness 57 y.o. male admitted 6/12 with Gangrene Left Foot. S/P Lt BKA 6/12. PMH of GERD, HTN, COPD, Femoral bypass, CAD, DM II    PT Comments    Tolerated progression well today with initial gait training. Min assist with gait and transfer to stand. Pt requests and plans to go to SNF in [place], [place]. Educated on post-op precautions and HEP reviewed. Patient will benefit from continued inpatient follow up therapy, <3 hours/day    Recommendations for follow up therapy are one component of a multi-disciplinary discharge planning process, led by the attending physician.  Recommendations may be updated based on patient status, additional functional criteria and insurance authorization.  Follow Up Recommendations  Can patient physically be transported by private vehicle: Yes    Assistance Recommended at Discharge Frequent or constant Supervision/Assistance  Patient can return home with the following A little help with bathing/dressing/bathroom;Assistance with cooking/housework;Assist for transportation;Help with stairs or ramp for entrance;A little help with walking and/or transfers   Equipment Recommendations  None recommended by PT    Recommendations for Other Services       Precautions / Restrictions Precautions Precautions: Fall Required Braces or Orthoses: Other Brace Other Brace: Ampushield (wound vac) Restrictions Weight Bearing Restrictions: Yes LLE Weight Bearing: Non weight bearing Other Position/Activity Restrictions: L BKA     Mobility  Bed Mobility Overal bed mobility: Needs Assistance Bed Mobility: Supine to Sit     Supine to sit: Min guard     General bed mobility comments: Min guard for safety, cues for technique, no physical assist required. Helped with lines/leads/vac.    Transfers Overall transfer level: Needs  assistance Equipment used: Rolling walker (2 wheels) Transfers: Sit to/from Stand Sit to Stand: From elevated surface, Min assist   Step pivot transfers: Min assist       General transfer comment: Min assist for boost to stand, cues for technique. SLow to rise.    Ambulation/Gait Ambulation/Gait assistance: Min assist Gait Distance (Feet): 10 Feet Assistive device: Rolling walker (2 wheels) Gait Pattern/deviations:  (hop) Gait velocity: slow Gait velocity interpretation: <1.31 ft/sec, indicative of household ambulator   General Gait Details: Educated on safe AD use with RW. Cues for technique, sequencing weight shift and UE use on RW. Good transition with step, min assist for RW control and placement. Easily fatigued.   Stairs             Wheelchair Mobility    Modified Rankin (Stroke Patients Only)       Balance Overall balance assessment: Needs assistance Sitting-balance support: Feet supported Sitting balance-Leahy Scale: Fair     Standing balance support: Reliant on assistive device for balance, Bilateral upper extremity supported Standing balance-Leahy Scale: Poor Standing balance comment: Stood with min guard                            Cognition Arousal/Alertness: Awake/alert Behavior During Therapy: WFL for tasks assessed/performed Overall Cognitive Status: Within Functional Limits for tasks assessed                                          Exercises Amputee Exercises Quad Sets: Strengthening, Both, 5 reps, Seated Gluteal Sets: Strengthening, Both, 5 reps, Seated Hip ABduction/ADduction:  Strengthening, Both, 5 reps, Seated Hip Flexion/Marching: Strengthening, Left, 5 reps, Seated Knee Flexion: Strengthening, AROM, Both, 5 reps, Seated Knee Extension: AROM, Left, 5 reps, Seated    General Comments General comments (skin integrity, edema, etc.): Reviewed HEP, preacutions, ampu shield use, alignment and elevation       Pertinent Vitals/Pain Pain Assessment Pain Assessment: Faces Faces Pain Scale: Hurts little more Pain Location: LLE Pain Descriptors / Indicators: Aching, Operative site guarding Pain Intervention(s): Monitored during session, Repositioned    Home Living                          Prior Function            PT Goals (current goals can now be found in the care plan section) Acute Rehab PT Goals Patient Stated Goal: Take it slow PT Goal Formulation: With patient Time For Goal Achievement: 04/27/23 Potential to Achieve Goals: Good Progress towards PT goals: Progressing toward goals    Frequency    Min 4X/week      PT Plan Discharge plan needs to be updated    Co-evaluation              AM-PAC PT "6 Clicks" Mobility   Outcome Measure  Help needed turning from your back to your side while in a flat bed without using bedrails?: A Little Help needed moving from lying on your back to sitting on the side of a flat bed without using bedrails?: A Little Help needed moving to and from a bed to a chair (including a wheelchair)?: A Little Help needed standing up from a chair using your arms (e.g., wheelchair or bedside chair)?: A Little Help needed to walk in hospital room?: A Little Help needed climbing 3-5 steps with a railing? : Total 6 Click Score: 16    End of Session Equipment Utilized During Treatment: Gait belt;Other (comment) (ampushield) Activity Tolerance: Patient tolerated treatment well Patient left: in chair;with call bell/phone within reach;with chair alarm set;with SCD's reapplied Nurse Communication: Mobility status PT Visit Diagnosis: Unsteadiness on feet (R26.81);Other abnormalities of gait and mobility (R26.89);Difficulty in walking, not elsewhere classified (R26.2);Pain Pain - Right/Left: Left Pain - part of body: Leg     Time: 0850-0908 PT Time Calculation (min) (ACUTE ONLY): 18 min  Charges:  $Gait Training: 8-22 mins                      Kathlyn Sacramento, PT, DPT [facility]  Rehabilitation Services Physical Therapist Office: [phone] Website: [email or web address]    Berton Mount 04/16/2023, 9:47 AM

## 2023-04-16 NOTE — Progress Notes (Signed)
Patient ID: Edward Mealing., male   DOB: 06/21/1966, 57 y.o.   MRN: [id number] Patient is status post transtibial amputation.  He is comfortable eating breakfast.  Plan for discharge to skilled nursing.  Prescription on the chart.

## 2023-04-16 NOTE — Progress Notes (Signed)
Tempted calling report to [facility] twice 30 min between times.  Put on hold and no one will pick back up.

## 2023-04-18 ENCOUNTER — Telehealth: Payer: Self-pay

## 2023-04-18 NOTE — Telephone Encounter (Signed)
-----   Message from Rodena Medin, [place] sent at 04/16/2023  3:41 PM EDT ----- Regarding: RE: monitor Pt d/c to [facility] today. [phone] will call to make an appt for follow up in office.  ----- Message ----- From: Rodena Medin, RMA Sent: 04/13/2023   1:07 PM EDT To: Rodena Medin, RMA Subject: RE: monitor                                    Pt pending CIR approval  ----- Message ----- From: Rodena Medin, RMA Sent: 04/11/2023  12:50 PM EDT To: Rodena Medin, RMA Subject: RE: monitor                                    38 micro  ----- Message ----- From: Rodena Medin, RMA Sent: 04/09/2023   4:49 PM EDT To: Rodena Medin, RMA Subject: monitor                                        04/11/2023 left BKA

## 2023-04-18 NOTE — Telephone Encounter (Signed)
Can you please call Edward Rocha and make an appt for this pt to come in next week with either Erin or Dr. Lajoyce Corners please. He is s/p a left BKA and d/c from the hospital 04/16/2023. Needs follow up. Thanks so much!

## 2023-04-19 ENCOUNTER — Telehealth: Payer: Self-pay | Admitting: *Deleted

## 2023-04-19 ENCOUNTER — Telehealth: Payer: Self-pay

## 2023-04-19 NOTE — Telephone Encounter (Signed)
RNCM received information from [facility] that patient has called them because they have provided services in the past. He states when he was discharged this week from his hospitalization for a Left BKA with Dr. Lajoyce Corners on 04/11/23 and discharge on 04/16/23 that he was supposed to go to [facility], but he did not. There is no documentation of this not happening in the hospital chart. CM received all information from [facility] rep and updated Dr. Lajoyce Corners that patient is at home and they have spoken to his brother, who is concerned due to his safety. RNCM was unable to reach patient directly and unable to leave a VM. Did speak to his brother Alinda Money and asked what happened. He states that their brother in law came and picked him up and took him home he thinks. He would like him somewhere closer to [facility] so that he can make sure he's doing ok. [facility] rep also reaching out to [facility] to see if patient can still be admitted even with going home from hospital instead of straight there per authorization obtained. Will continue to follow to see what can be done for patient.

## 2023-04-19 NOTE — Telephone Encounter (Signed)
Patient called triage line stating that someone had been trying to call him. Follow up appt for post op care scheduled with Erin for next Wednesday. Patient also said to let you know that he signed himself out of SNF because the place they took him, had a bad reputation when he was growing up, so he is now home.

## 2023-04-19 NOTE — Telephone Encounter (Signed)
Case management advised that the pt arrived home and states that he can not care for himself and he wants to go to a facility.case management currently working on this

## 2023-04-21 ENCOUNTER — Emergency Department (HOSPITAL_COMMUNITY): Payer: Medicare Other

## 2023-04-21 ENCOUNTER — Other Ambulatory Visit: Payer: Self-pay

## 2023-04-21 ENCOUNTER — Emergency Department (HOSPITAL_COMMUNITY)
Admission: EM | Admit: 2023-04-21 | Discharge: 2023-04-21 | Disposition: A | Discharge status: Home / Self Care | Payer: Medicare Other | Attending: Emergency Medicine | Admitting: Emergency Medicine

## 2023-04-21 ENCOUNTER — Encounter (HOSPITAL_COMMUNITY): Payer: Self-pay | Admitting: *Deleted

## 2023-04-21 DIAGNOSIS — Z7982 Long term (current) use of aspirin: Secondary | ICD-10-CM | POA: Diagnosis not present

## 2023-04-21 DIAGNOSIS — I5023 Acute on chronic systolic (congestive) heart failure: Secondary | ICD-10-CM | POA: Diagnosis not present

## 2023-04-21 DIAGNOSIS — E1122 Type 2 diabetes mellitus with diabetic chronic kidney disease: Secondary | ICD-10-CM | POA: Diagnosis not present

## 2023-04-21 DIAGNOSIS — I509 Heart failure, unspecified: Secondary | ICD-10-CM | POA: Insufficient documentation

## 2023-04-21 DIAGNOSIS — Z7901 Long term (current) use of anticoagulants: Secondary | ICD-10-CM | POA: Insufficient documentation

## 2023-04-21 DIAGNOSIS — N189 Chronic kidney disease, unspecified: Secondary | ICD-10-CM | POA: Diagnosis not present

## 2023-04-21 DIAGNOSIS — Z79899 Other long term (current) drug therapy: Secondary | ICD-10-CM | POA: Insufficient documentation

## 2023-04-21 DIAGNOSIS — Z87891 Personal history of nicotine dependence: Secondary | ICD-10-CM | POA: Diagnosis not present

## 2023-04-21 DIAGNOSIS — J449 Chronic obstructive pulmonary disease, unspecified: Secondary | ICD-10-CM | POA: Insufficient documentation

## 2023-04-21 DIAGNOSIS — R0602 Shortness of breath: Secondary | ICD-10-CM | POA: Diagnosis present

## 2023-04-21 LAB — CBC WITH DIFFERENTIAL/PLATELET
Abs Immature Granulocytes: 0.02 10*3/uL (ref 0.00–0.07)
Basophils Absolute: 0 10*3/uL (ref 0.0–0.1)
Basophils Relative: 0 %
Eosinophils Absolute: 0.1 10*3/uL (ref 0.0–0.5)
Eosinophils Relative: 1 %
HCT: 29.1 % — ABNORMAL LOW (ref 39.0–52.0)
Hemoglobin: 9.3 g/dL — ABNORMAL LOW (ref 13.0–17.0)
Immature Granulocytes: 0 %
Lymphocytes Relative: 13 %
Lymphs Abs: 0.6 10*3/uL — ABNORMAL LOW (ref 0.7–4.0)
MCH: 25.8 pg — ABNORMAL LOW (ref 26.0–34.0)
MCHC: 32 g/dL (ref 30.0–36.0)
MCV: 80.8 fL (ref 80.0–100.0)
Monocytes Absolute: 0.6 10*3/uL (ref 0.1–1.0)
Monocytes Relative: 12 %
Neutro Abs: 3.5 10*3/uL (ref 1.7–7.7)
Neutrophils Relative %: 74 %
Platelets: 188 10*3/uL (ref 150–400)
RBC: 3.6 MIL/uL — ABNORMAL LOW (ref 4.22–5.81)
RDW: 16.9 % — ABNORMAL HIGH (ref 11.5–15.5)
WBC: 4.8 10*3/uL (ref 4.0–10.5)
nRBC: 0 % (ref 0.0–0.2)

## 2023-04-21 LAB — URINALYSIS, ROUTINE W REFLEX MICROSCOPIC
Bacteria, UA: NONE SEEN
Bilirubin Urine: NEGATIVE
Glucose, UA: NEGATIVE mg/dL
Hgb urine dipstick: NEGATIVE
Ketones, ur: NEGATIVE mg/dL
Leukocytes,Ua: NEGATIVE
Nitrite: NEGATIVE
Protein, ur: 30 mg/dL — AB
Specific Gravity, Urine: 1.008 (ref 1.005–1.030)
pH: 6 (ref 5.0–8.0)

## 2023-04-21 LAB — COMPREHENSIVE METABOLIC PANEL
ALT: 16 U/L (ref 0–44)
AST: 34 U/L (ref 15–41)
Albumin: 2.7 g/dL — ABNORMAL LOW (ref 3.5–5.0)
Alkaline Phosphatase: 53 U/L (ref 38–126)
Anion gap: 8 (ref 5–15)
BUN: 16 mg/dL (ref 6–20)
CO2: 23 mmol/L (ref 22–32)
Calcium: 8 mg/dL — ABNORMAL LOW (ref 8.9–10.3)
Chloride: 100 mmol/L (ref 98–111)
Creatinine, Ser: 0.94 mg/dL (ref 0.61–1.24)
GFR, Estimated: >60 mL/min (ref >60)
Glucose, Bld: 125 mg/dL — ABNORMAL HIGH (ref 70–99)
Potassium: 3.7 mmol/L (ref 3.5–5.1)
Sodium: 131 mmol/L — ABNORMAL LOW (ref 135–145)
Total Bilirubin: 2.2 mg/dL — ABNORMAL HIGH (ref 0.3–1.2)
Total Protein: 6.8 g/dL (ref 6.5–8.1)

## 2023-04-21 LAB — LACTIC ACID, PLASMA
Lactic Acid, Venous: 1.5 mmol/L (ref 0.5–1.9)
Lactic Acid, Venous: 1.4 mmol/L (ref 0.5–1.9)

## 2023-04-21 LAB — BRAIN NATRIURETIC PEPTIDE: B Natriuretic Peptide: 1995 pg/mL — ABNORMAL HIGH (ref 0.0–100.0)

## 2023-04-21 LAB — TROPONIN I (HIGH SENSITIVITY)
Troponin I (High Sensitivity): 30 ng/L — ABNORMAL HIGH (ref <18)
Troponin I (High Sensitivity): 30 ng/L — ABNORMAL HIGH (ref <18)

## 2023-04-21 MED ORDER — FUROSEMIDE 40 MG PO TABS: 40.0000 mg | ORAL_TABLET | Freq: Two times a day (BID) | ORAL | 0 refills | Status: AC

## 2023-04-21 MED ORDER — FUROSEMIDE 10 MG/ML IJ SOLN
60.0000 mg | Freq: Once | INTRAMUSCULAR | Status: AC
  Administered 2023-04-21: 60 mg via INTRAVENOUS
  Filled 2023-04-21: qty 6

## 2023-04-21 NOTE — ED Provider Notes (Signed)
[place] EMERGENCY DEPARTMENT AT [facility] Provider Note   CSN: [id number] Arrival date & time: 04/21/23  1102     History  Chief Complaint  Patient presents with   Shortness of Breath    Edward Bilyeu. is a 57 y.o. male.  HPI Presents with shortness of breath.  Patient has multiple medical issues including diabetes, PVD, cirrhosis, CKD, CHF, COPD.  He is a former smoker. Notably the patient also had left leg below the knee amputation, June 12. He notes that over the past 4 to 5 days he has been dyspneic, no chest pain, no syncope, no fever.  Wound VAC continues to work, and he has no complaints in the left lower extremity. Patient is on Xarelto, has been taking it reliably according to him.      Home Medications Prior to Admission medications   Medication Sig Start Date End Date Taking? Authorizing Provider  acetaminophen (TYLENOL) 500 MG tablet Take 1 tablet (500 mg total) by mouth daily as needed for moderate pain or headache. 12/19/21   Zannie Cove, MD  aspirin EC 81 MG tablet Take 81 mg by mouth daily.    [provider]  atorvastatin (LIPITOR) 80 MG tablet Take 1 tablet (80 mg total) by mouth daily. 03/13/23   Willette Cluster, MD  dapagliflozin propanediol (FARXIGA) 10 MG TABS tablet Take 1 tablet (10 mg total) by mouth daily. Patient taking differently: Take 10 mg by mouth at bedtime. 08/14/22   Jonelle Sidle, MD  Elastic Bandages & Supports (KNEE SUPPORT/ELASTIC/FIRM MED) MISC 1 each by Does not apply route as directed. Medium pressure compression stockings Dx: leg edema 01/13/22   Jonelle Sidle, MD  furosemide (LASIX) 40 MG tablet Take 1 tablet (40 mg total) by mouth 2 (two) times daily for 5 days. 04/21/23 04/26/23  Gerhard Munch, MD  gabapentin (NEURONTIN) 400 MG capsule Take 1 capsule (400 mg total) by mouth 3 (three) times daily. Patient taking differently: Take 300 mg by mouth 2 (two) times daily. 03/12/23 04/11/23  Willette Cluster,  MD  metoprolol succinate (TOPROL XL) 50 MG 24 hr tablet Take 1 tablet (50 mg total) by mouth daily. Patient taking differently: Take 25 mg by mouth at bedtime. 11/02/22   Sharlene Dory, NP  nitroGLYCERIN (NITROSTAT) 0.4 MG SL tablet Place 0.4 mg under the tongue every 5 (five) minutes as needed for chest pain.    [provider]  oxyCODONE-acetaminophen (PERCOCET/ROXICET) 5-325 MG tablet Take 1 tablet by mouth every 4 (four) hours as needed. 04/16/23   [name], MD  oxyCODONE-acetaminophen (PERCOCET/ROXICET) 5-325 MG tablet Take 1 tablet by mouth every 4 (four) hours as needed. 04/16/23   [name], MD  rivaroxaban (XARELTO) 2.5 MG TABS tablet Take 1 tablet (2.5 mg total) by mouth 2 (two) times daily. 03/12/23 04/11/23  Willette Cluster, MD  sacubitril-valsartan (ENTRESTO) 24-26 MG Take 1 tablet by mouth 2 (two) times daily. 11/02/22   Sharlene Dory, NP  spironolactone (ALDACTONE) 25 MG tablet Take 0.5 tablets (12.5 mg total) by mouth daily. Patient taking differently: Take 25 mg by mouth at bedtime. 08/14/22   Jonelle Sidle, MD      Allergies    Other    Review of Systems   Review of Systems  All other systems reviewed and are negative.   Physical Exam Updated Vital Signs BP 117/85   Pulse (!) 110   Temp (!) 97.5 F (36.4 C) (Oral)   Resp Marland Kitchen)  30   Ht 6\' 1"  (1.854 m)   Wt 81.6 kg   SpO2 99%   BMI 23.75 kg/m  Physical Exam Vitals and nursing note reviewed.  Constitutional:      General: He is not in acute distress.    Appearance: He is well-developed.  HENT:     Head: Normocephalic and atraumatic.  Eyes:     Conjunctiva/sclera: Conjunctivae normal.  Cardiovascular:     Rate and Rhythm: Normal rate and regular rhythm.  Pulmonary:     Effort: Pulmonary effort is normal. Tachypnea present. No respiratory distress.     Breath sounds: No stridor. Decreased breath sounds present. No wheezing.  Abdominal:     General: There is no distension.  Musculoskeletal:      Comments: Left leg below the knee and at wound VAC, no proximal erythema, swelling. Right leg with pitting edema throughout the lower portion.  Skin:    General: Skin is warm and dry.  Neurological:     Mental Status: He is alert and oriented to person, place, and time.     ED Results / Procedures / Treatments   Labs (all labs ordered are listed, but only abnormal results are displayed) Labs Reviewed  COMPREHENSIVE METABOLIC PANEL - Abnormal; Notable for the following components:      Result Value   Sodium 131 (*)    Glucose, Bld 125 (*)    Calcium 8.0 (*)    Albumin 2.7 (*)    Total Bilirubin 2.2 (*)    All other components within normal limits  CBC WITH DIFFERENTIAL/PLATELET - Abnormal; Notable for the following components:   RBC 3.60 (*)    Hemoglobin 9.3 (*)    HCT 29.1 (*)    MCH 25.8 (*)    RDW 16.9 (*)    Lymphs Abs 0.6 (*)    All other components within normal limits  BRAIN NATRIURETIC PEPTIDE - Abnormal; Notable for the following components:   B Natriuretic Peptide 1,995.0 (*)    All other components within normal limits  TROPONIN I (HIGH SENSITIVITY) - Abnormal; Notable for the following components:   Troponin I (High Sensitivity) 30 (*)    All other components within normal limits  TROPONIN I (HIGH SENSITIVITY) - Abnormal; Notable for the following components:   Troponin I (High Sensitivity) 30 (*)    All other components within normal limits  LACTIC ACID, PLASMA  LACTIC ACID, PLASMA  URINALYSIS, ROUTINE W REFLEX MICROSCOPIC    EKG EKG Interpretation  Date/Time:  Saturday April 21 2023 11:13:21 EDT Ventricular Rate:  119 PR Interval:  169 QRS Duration: 87 QT Interval:  298 QTC Calculation: 420 R Axis:   75 Text Interpretation: Sinus tachycardia LAE, consider biatrial enlargement Left ventricular hypertrophy Nonspecific T abnormalities, inferior leads Confirmed by Gerhard Munch [phone]) on 04/21/2023 11:20:42 AM  Radiology DG Chest Portable 1  View  Result Date: 04/21/2023 CLINICAL DATA:  Shortness of breath. EXAM: PORTABLE CHEST 1 VIEW COMPARISON:  February 27, 2023. FINDINGS: Mild cardiomegaly is noted with mild central pulmonary vascular congestion. Minimal bilateral pulmonary edema may be present. Bony thorax is unremarkable. IMPRESSION: Mild cardiomegaly with mild central pulmonary vascular congestion and possible minimal bilateral pulmonary edema. Aortic Atherosclerosis (ICD10-I70.0). Electronically Signed   By: Lupita Raider M.D.   On: 04/21/2023 11:54    Procedures Procedures    Medications Ordered in ED Medications  furosemide (LASIX) injection 60 mg (60 mg Intravenous Given 04/21/23 1309)    ED Course/ Medical  Decision Making/ A&P                             Medical Decision Making Adult male with innumerable medical problems including CKD, CHF, COPD, recent amputation now presents with dyspnea.  Patient is afebrile, and without chest pain, reassuring for lower suspicion for infection, bacteremia, sepsis, pneumonia. Patient is on Xarelto, has no chest pain, is not hypoxic, somewhat reassuring for low suspicion of PE. Given his tachycardia, tachypnea, CHF exacerbation, COPD, CKD, all considerations.  Cardiac 110 sinus tach abnormal Pulse ox 100% room air normal   Amount and/or Complexity of Data Reviewed External Data Reviewed: notes.    Details: Ortho notes included below Labs: ordered. Decision-making details documented in ED Course. Radiology: ordered and independent interpretation performed. Decision-making details documented in ED Course. ECG/medicine tests: ordered and independent interpretation performed. Decision-making details documented in ED Course.  Risk Prescription drug management. Decision regarding hospitalization. Diagnosis or treatment significantly limited by social determinants of health.  Discharge Diagnoses:  Principal Problem:   S/P BKA (below knee amputation) unilateral, left (HCC)      Surgeries: Procedure(s): LEFT BELOW KNEE AMPUTATION on 04/11/2023    Consultants:    Discharged Condition: Improved   Hospital Course: Edward Antonellis. is an 57 y.o. male who was admitted 04/11/2023 with a chief complaint of gangrene left foot, with a final diagnosis of Gangrene Left Foot.  Patient was brought to the operating room on 04/11/2023 and underwent Procedure(s): LEFT BELOW KNEE AMPUTATION.     Patient was given perioperative antibiotics:   3:10 PM Patient awake, alert, sitting upright, speaking clearly, no increased work of breathing.  Mild tachycardia. We discussed his presentation, and history. With findings consistent with heart failure worsening, but no new oxygen requirement, no new decompensation, and development of symptoms over a few days patient will receive IV Lasix here, prefers to go home, with increased Lasix dosing to follow-up with his physician and follow-up with his orthopedist as previously scheduled. No evidence for concurrent new phenomena, such as PE, as above the patient is taking his Xarelto regularly. No evidence for concurrent new infection/bacteremia/sepsis. Patient has baseline troponin abnormalities and today's 2 values are unchanged.  Absent ischemic changes on EKG, with no chest pain, patient has trivial elevation likely secondary to his worsening heart failure.        Final Clinical Impression(s) / ED Diagnoses Final diagnoses:  Acute on chronic systolic congestive heart failure (HCC)    Rx / DC Orders ED Discharge Orders          Ordered    AMB referral to CHF clinic        04/21/23 1509    furosemide (LASIX) 40 MG tablet  2 times daily        04/21/23 1509              Gerhard Munch, MD 04/21/23 1510

## 2023-04-21 NOTE — Discharge Instructions (Signed)
As discussed, today's emergency department evaluation for your difficulty breathing is resulted in diagnosis of heart failure exacerbation.  In addition to the IV medication provided here, your regimen for Lasix will change for the next 5 days.  Please take your new regimen, 40 mg, twice daily and discuss this with your physician on follow-up.  If you develop new, worsening, or any changes in your condition do not hesitate to return here.

## 2023-04-21 NOTE — ED Notes (Signed)
ED Provider at bedside. 

## 2023-04-21 NOTE — ED Triage Notes (Signed)
Pt with c/o SOB for past 2 days with exertion. Denies any CP.

## 2023-04-25 ENCOUNTER — Encounter: Payer: Medicare Other | Admitting: Family

## 2023-05-01 ENCOUNTER — Telehealth: Payer: Self-pay | Admitting: Orthopedic Surgery

## 2023-05-01 NOTE — Telephone Encounter (Signed)
Looking in patient's chart before calling pt's brother. We have not seen pt for his most recent surgery on 04/11/23. Last telephone message states that a case manager is supposed to be working on a facility placement for him since he wrote himself out of the SNF he was at after his first surgery(said that it didn't have a good reputation when he was growing up). He cannot help himself at home alone.   I will call brother to get more information

## 2023-05-01 NOTE — Telephone Encounter (Signed)
The patients brother called with concerns regarding his brothers care. He would like for someone to call him back as soon as possible. [phone]

## 2023-05-02 NOTE — Telephone Encounter (Signed)
Edward Rocha can you call pt's brother about issues?

## 2023-05-07 ENCOUNTER — Emergency Department (HOSPITAL_COMMUNITY): Payer: Medicare Other

## 2023-05-07 ENCOUNTER — Emergency Department (HOSPITAL_COMMUNITY)
Admission: EM | Admit: 2023-05-07 | Discharge: 2023-05-07 | Disposition: A | Discharge status: Home / Self Care | Payer: Medicare Other | Attending: Emergency Medicine | Admitting: Emergency Medicine

## 2023-05-07 DIAGNOSIS — Z794 Long term (current) use of insulin: Secondary | ICD-10-CM | POA: Insufficient documentation

## 2023-05-07 DIAGNOSIS — J9600 Acute respiratory failure, unspecified whether with hypoxia or hypercapnia: Secondary | ICD-10-CM | POA: Diagnosis not present

## 2023-05-07 DIAGNOSIS — R000 Tachycardia, unspecified: Secondary | ICD-10-CM | POA: Insufficient documentation

## 2023-05-07 DIAGNOSIS — A419 Sepsis, unspecified organism: Secondary | ICD-10-CM | POA: Insufficient documentation

## 2023-05-07 DIAGNOSIS — E875 Hyperkalemia: Secondary | ICD-10-CM | POA: Insufficient documentation

## 2023-05-07 DIAGNOSIS — I110 Hypertensive heart disease with heart failure: Secondary | ICD-10-CM | POA: Insufficient documentation

## 2023-05-07 DIAGNOSIS — E872 Acidosis, unspecified: Secondary | ICD-10-CM | POA: Diagnosis not present

## 2023-05-07 DIAGNOSIS — E119 Type 2 diabetes mellitus without complications: Secondary | ICD-10-CM | POA: Insufficient documentation

## 2023-05-07 DIAGNOSIS — Z1152 Encounter for screening for COVID-19: Secondary | ICD-10-CM | POA: Insufficient documentation

## 2023-05-07 DIAGNOSIS — R0603 Acute respiratory distress: Secondary | ICD-10-CM | POA: Diagnosis present

## 2023-05-07 DIAGNOSIS — D72829 Elevated white blood cell count, unspecified: Secondary | ICD-10-CM | POA: Diagnosis not present

## 2023-05-07 DIAGNOSIS — R55 Syncope and collapse: Secondary | ICD-10-CM | POA: Diagnosis present

## 2023-05-07 DIAGNOSIS — Z7901 Long term (current) use of anticoagulants: Secondary | ICD-10-CM | POA: Insufficient documentation

## 2023-05-07 DIAGNOSIS — D696 Thrombocytopenia, unspecified: Secondary | ICD-10-CM | POA: Insufficient documentation

## 2023-05-07 DIAGNOSIS — J810 Acute pulmonary edema: Secondary | ICD-10-CM | POA: Insufficient documentation

## 2023-05-07 DIAGNOSIS — I509 Heart failure, unspecified: Secondary | ICD-10-CM | POA: Diagnosis not present

## 2023-05-07 DIAGNOSIS — Z7982 Long term (current) use of aspirin: Secondary | ICD-10-CM | POA: Insufficient documentation

## 2023-05-07 DIAGNOSIS — Z79899 Other long term (current) drug therapy: Secondary | ICD-10-CM | POA: Diagnosis not present

## 2023-05-07 DIAGNOSIS — N179 Acute kidney failure, unspecified: Secondary | ICD-10-CM | POA: Insufficient documentation

## 2023-05-07 LAB — COMPREHENSIVE METABOLIC PANEL
ALT: 262 U/L — ABNORMAL HIGH (ref 0–44)
AST: 1029 U/L — ABNORMAL HIGH (ref 15–41)
Albumin: 2.2 g/dL — ABNORMAL LOW (ref 3.5–5.0)
Alkaline Phosphatase: 250 U/L — ABNORMAL HIGH (ref 38–126)
BUN: 135 mg/dL — ABNORMAL HIGH (ref 6–20)
CO2: <7 mmol/L — ABNORMAL LOW (ref 22–32)
Calcium: 8.8 mg/dL — ABNORMAL LOW (ref 8.9–10.3)
Chloride: 94 mmol/L — ABNORMAL LOW (ref 98–111)
Creatinine, Ser: 5.89 mg/dL — ABNORMAL HIGH (ref 0.61–1.24)
GFR, Estimated: 10 mL/min — ABNORMAL LOW (ref >60)
Glucose, Bld: <20 mg/dL — CL (ref 70–99)
Potassium: >7.5 mmol/L (ref 3.5–5.1)
Sodium: 131 mmol/L — ABNORMAL LOW (ref 135–145)
Total Bilirubin: 6 mg/dL — ABNORMAL HIGH (ref 0.3–1.2)
Total Protein: 8 g/dL (ref 6.5–8.1)

## 2023-05-07 LAB — URINALYSIS, W/ REFLEX TO CULTURE (INFECTION SUSPECTED)
Bilirubin Urine: NEGATIVE
Glucose, UA: 50 mg/dL — AB
Ketones, ur: NEGATIVE mg/dL
Leukocytes,Ua: NEGATIVE
Nitrite: NEGATIVE
Protein, ur: 100 mg/dL — AB
Specific Gravity, Urine: 1.016 (ref 1.005–1.030)
pH: 5 (ref 5.0–8.0)

## 2023-05-07 LAB — CBC WITH DIFFERENTIAL/PLATELET
Abs Immature Granulocytes: 1.9 10*3/uL — ABNORMAL HIGH (ref 0.00–0.07)
Band Neutrophils: 1 %
Basophils Absolute: 0 10*3/uL (ref 0.0–0.1)
Basophils Relative: 0 %
Eosinophils Absolute: 0 10*3/uL (ref 0.0–0.5)
Eosinophils Relative: 0 %
HCT: 33.9 % — ABNORMAL LOW (ref 39.0–52.0)
Hemoglobin: 10.3 g/dL — ABNORMAL LOW (ref 13.0–17.0)
Lymphocytes Relative: 13 %
Lymphs Abs: 4 10*3/uL (ref 0.7–4.0)
MCH: 26.3 pg (ref 26.0–34.0)
MCHC: 30.4 g/dL (ref 30.0–36.0)
MCV: 86.7 fL (ref 80.0–100.0)
Metamyelocytes Relative: 4 %
Monocytes Absolute: 0.9 10*3/uL (ref 0.1–1.0)
Monocytes Relative: 3 %
Myelocytes: 2 %
Neutro Abs: 24.2 10*3/uL — ABNORMAL HIGH (ref 1.7–7.7)
Neutrophils Relative %: 77 %
Platelets: 71 10*3/uL — ABNORMAL LOW (ref 150–400)
RBC: 3.91 MIL/uL — ABNORMAL LOW (ref 4.22–5.81)
RDW: 20.2 % — ABNORMAL HIGH (ref 11.5–15.5)
WBC: 31 10*3/uL — ABNORMAL HIGH (ref 4.0–10.5)
nRBC: 1.8 % — ABNORMAL HIGH (ref 0.0–0.2)

## 2023-05-07 LAB — LACTIC ACID, PLASMA
Lactic Acid, Venous: >9 mmol/L (ref 0.5–1.9)
Lactic Acid, Venous: >9 mmol/L (ref 0.5–1.9)

## 2023-05-07 LAB — I-STAT CHEM 8, ED
BUN: >130 mg/dL — ABNORMAL HIGH (ref 6–20)
Calcium, Ion: 1.01 mmol/L — ABNORMAL LOW (ref 1.15–1.40)
Chloride: 103 mmol/L (ref 98–111)
Creatinine, Ser: 6.4 mg/dL — ABNORMAL HIGH (ref 0.61–1.24)
Glucose, Bld: <20 mg/dL — CL (ref 70–99)
HCT: 35 % — ABNORMAL LOW (ref 39.0–52.0)
Hemoglobin: 11.9 g/dL — ABNORMAL LOW (ref 13.0–17.0)
Potassium: 7.9 mmol/L (ref 3.5–5.1)
Sodium: 127 mmol/L — ABNORMAL LOW (ref 135–145)
TCO2: 7 mmol/L — ABNORMAL LOW (ref 22–32)

## 2023-05-07 LAB — BLOOD GAS, ARTERIAL
Acid-base deficit: 23.5 mmol/L — ABNORMAL HIGH (ref 0.0–2.0)
Bicarbonate: 6.7 mmol/L — ABNORMAL LOW (ref 20.0–28.0)
Drawn by: 35043
O2 Saturation: 100 %
Patient temperature: 37.1
pCO2 arterial: 29 mmHg — ABNORMAL LOW (ref 32–48)
pH, Arterial: 6.97 — CL (ref 7.35–7.45)
pO2, Arterial: 305 mmHg — ABNORMAL HIGH (ref 83–108)

## 2023-05-07 LAB — PROTIME-INR
INR: 4.3 (ref 0.8–1.2)
Prothrombin Time: 41.2 seconds — ABNORMAL HIGH (ref 11.4–15.2)

## 2023-05-07 LAB — BASIC METABOLIC PANEL
Anion gap: 27 — ABNORMAL HIGH (ref 5–15)
BUN: 132 mg/dL — ABNORMAL HIGH (ref 6–20)
CO2: 7 mmol/L — ABNORMAL LOW (ref 22–32)
Calcium: 8.1 mg/dL — ABNORMAL LOW (ref 8.9–10.3)
Chloride: 98 mmol/L (ref 98–111)
Creatinine, Ser: 5.7 mg/dL — ABNORMAL HIGH (ref 0.61–1.24)
GFR, Estimated: 11 mL/min — ABNORMAL LOW (ref >60)
Glucose, Bld: 150 mg/dL — ABNORMAL HIGH (ref 70–99)
Potassium: 7.2 mmol/L (ref 3.5–5.1)
Sodium: 132 mmol/L — ABNORMAL LOW (ref 135–145)

## 2023-05-07 LAB — BLOOD GAS, VENOUS
Acid-base deficit: 26.9 mmol/L — ABNORMAL HIGH (ref 0.0–2.0)
Bicarbonate: 4 mmol/L — ABNORMAL LOW (ref 20.0–28.0)
Drawn by: 57525
O2 Saturation: 86.3 %
Patient temperature: 33.3
pCO2, Ven: <18 mmHg — CL (ref 44–60)
pH, Ven: 6.97 — CL (ref 7.25–7.43)
pO2, Ven: 57 mmHg — ABNORMAL HIGH (ref 32–45)

## 2023-05-07 LAB — CBG MONITORING, ED
Glucose-Capillary: <10 mg/dL — CL (ref 70–99)
Glucose-Capillary: 120 mg/dL — ABNORMAL HIGH (ref 70–99)

## 2023-05-07 LAB — CK: Total CK: 354 U/L (ref 49–397)

## 2023-05-07 LAB — TROPONIN I (HIGH SENSITIVITY): Troponin I (High Sensitivity): 223 ng/L (ref <18)

## 2023-05-07 LAB — RESP PANEL BY RT-PCR (RSV, FLU A&B, COVID)  RVPGX2
Influenza A by PCR: NEGATIVE
Influenza B by PCR: NEGATIVE
Resp Syncytial Virus by PCR: NEGATIVE
SARS Coronavirus 2 by RT PCR: NEGATIVE

## 2023-05-07 LAB — APTT: aPTT: 46 seconds — ABNORMAL HIGH (ref 24–36)

## 2023-05-07 LAB — CULTURE, BLOOD (ROUTINE X 2): Special Requests: ADEQUATE

## 2023-05-07 LAB — BRAIN NATRIURETIC PEPTIDE: B Natriuretic Peptide: >4500 pg/mL — ABNORMAL HIGH (ref 0.0–100.0)

## 2023-05-07 LAB — MAGNESIUM: Magnesium: 4.1 mg/dL — ABNORMAL HIGH (ref 1.7–2.4)

## 2023-05-07 MED ORDER — INSULIN ASPART 100 UNIT/ML IV SOLN
10.0000 [IU] | Freq: Once | INTRAVENOUS | Status: AC
  Administered 2023-05-07: 10 [IU] via INTRAVENOUS

## 2023-05-07 MED ORDER — CALCIUM GLUCONATE-NACL 1-0.675 GM/50ML-% IV SOLN
1.0000 g | Freq: Once | INTRAVENOUS | Status: AC
  Filled 2023-05-07: qty 50

## 2023-05-07 MED ORDER — VANCOMYCIN HCL IN DEXTROSE 1-5 GM/200ML-% IV SOLN: 1000.0000 mg | Freq: Once | INTRAVENOUS | Status: DC

## 2023-05-07 MED ORDER — SODIUM BICARBONATE 8.4 % IV SOLN: 50.0000 meq | Freq: Once | INTRAVENOUS | Status: AC

## 2023-05-07 MED ORDER — NOREPINEPHRINE 4 MG/250ML-% IV SOLN: 0.0000 ug/min | INTRAVENOUS | Status: DC

## 2023-05-07 MED ORDER — SODIUM CHLORIDE 0.9 % IV BOLUS: 1000.0000 mL | Freq: Once | INTRAVENOUS | Status: DC

## 2023-05-07 MED ORDER — PIPERACILLIN-TAZOBACTAM 3.375 G IVPB 30 MIN
3.3750 g | Freq: Once | INTRAVENOUS | Status: AC
  Administered 2023-05-07: 3.375 g via INTRAVENOUS
  Filled 2023-05-07: qty 50

## 2023-05-07 MED ORDER — VANCOMYCIN HCL IN DEXTROSE 1-5 GM/200ML-% IV SOLN
1000.0000 mg | Freq: Once | INTRAVENOUS | Status: DC
  Filled 2023-05-07: qty 200

## 2023-05-07 MED ORDER — INSULIN ASPART 100 UNIT/ML IJ SOLN
10.0000 [IU] | Freq: Once | INTRAMUSCULAR | Status: AC
  Administered 2023-05-07: 10 [IU] via INTRAVENOUS

## 2023-05-07 MED ORDER — SODIUM CHLORIDE 0.9 % IV SOLN
500.0000 mg | INTRAVENOUS | Status: DC
  Administered 2023-05-07: 500 mg via INTRAVENOUS
  Filled 2023-05-07: qty 5

## 2023-05-07 MED ORDER — PROPOFOL 1000 MG/100ML IV EMUL
INTRAVENOUS | Status: AC
  Administered 2023-05-07: 30 ug
  Filled 2023-05-07: qty 100

## 2023-05-07 MED ORDER — SODIUM BICARBONATE 8.4 % IV SOLN
INTRAVENOUS | Status: AC | PRN
  Administered 2023-05-07: 50 meq via INTRAVENOUS

## 2023-05-07 MED ORDER — PROPOFOL 10 MG/ML IV BOLUS
0.5000 mg/kg | Freq: Once | INTRAVENOUS | Status: AC
  Administered 2023-05-07: 30 ug via INTRAVENOUS

## 2023-05-07 MED ORDER — VASOPRESSIN 20 UNITS/100 ML INFUSION FOR SHOCK
0.0000 [IU]/min | INTRAVENOUS | Status: DC
  Administered 2023-05-07: 0.03 [IU]/min via INTRAVENOUS
  Filled 2023-05-07: qty 100

## 2023-05-07 MED ORDER — LACTATED RINGERS IV SOLN: INTRAVENOUS | Status: DC

## 2023-05-07 MED ORDER — DEXTROSE 50 % IV SOLN: 1.0000 | Freq: Once | INTRAVENOUS | Status: DC

## 2023-05-07 MED ORDER — CALCIUM GLUCONATE-NACL 1-0.675 GM/50ML-% IV SOLN
INTRAVENOUS | Status: AC
  Administered 2023-05-07: 1000 mg via INTRAVENOUS
  Filled 2023-05-07: qty 50

## 2023-05-07 MED ORDER — PROPOFOL 1000 MG/100ML IV EMUL
INTRAVENOUS | Status: AC
  Filled 2023-05-07: qty 100

## 2023-05-07 MED ORDER — FENTANYL 2500MCG IN NS 250ML (10MCG/ML) PREMIX INFUSION
0.0000 ug/h | INTRAVENOUS | Status: DC
  Administered 2023-05-07: 75 ug/h via INTRAVENOUS
  Filled 2023-05-07: qty 250

## 2023-05-07 MED ORDER — DIAZEPAM 5 MG/ML IJ SOLN
10.0000 mg | Freq: Once | INTRAMUSCULAR | Status: AC
  Administered 2023-05-07: 10 mg via INTRAVENOUS
  Filled 2023-05-07: qty 2

## 2023-05-07 MED ORDER — EPINEPHRINE 1 MG/10ML IJ SOSY
PREFILLED_SYRINGE | INTRAMUSCULAR | Status: AC | PRN
  Administered 2023-05-07: 1 mg via INTRAVENOUS

## 2023-05-07 MED ORDER — SODIUM BICARBONATE 8.4 % IV SOLN
INTRAVENOUS | Status: AC
  Administered 2023-05-07: 50 meq via INTRAVENOUS
  Filled 2023-05-07: qty 50

## 2023-05-07 MED ORDER — ROCURONIUM BROMIDE 10 MG/ML (PF) SYRINGE
PREFILLED_SYRINGE | INTRAVENOUS | Status: AC
  Filled 2023-05-07: qty 10

## 2023-05-07 MED ORDER — SODIUM CHLORIDE 0.9 % IV SOLN
2.0000 g | INTRAVENOUS | Status: DC
  Administered 2023-05-07: 2 g via INTRAVENOUS
  Filled 2023-05-07: qty 20

## 2023-05-07 MED ORDER — DEXTROSE 10 % IV SOLN: Freq: Once | INTRAVENOUS | Status: AC

## 2023-05-07 MED ORDER — DEXTROSE 50 % IV SOLN
25.0000 g | Freq: Once | INTRAVENOUS | Status: AC
  Administered 2023-05-07: 25 g via INTRAVENOUS

## 2023-05-07 MED ORDER — DEXTROSE 10 % IV SOLN: Freq: Once | INTRAVENOUS | Status: DC

## 2023-05-07 MED ORDER — ETOMIDATE 2 MG/ML IV SOLN
INTRAVENOUS | Status: AC
  Filled 2023-05-07: qty 20

## 2023-05-07 MED ORDER — DEXTROSE 50 % IV SOLN
INTRAVENOUS | Status: AC | PRN
  Administered 2023-05-07: 1 via INTRAVENOUS

## 2023-05-07 MED ORDER — NOREPINEPHRINE 4 MG/250ML-% IV SOLN
INTRAVENOUS | Status: AC
  Administered 2023-05-07: 5 ug/min
  Filled 2023-05-07: qty 250

## 2023-05-07 MED ORDER — DEXTROSE 50 % IV SOLN
INTRAVENOUS | Status: AC
  Filled 2023-05-07: qty 50

## 2023-05-07 MED ORDER — VANCOMYCIN HCL 500 MG/100ML IV SOLN
500.0000 mg | Freq: Once | INTRAVENOUS | Status: DC
  Filled 2023-05-07: qty 100

## 2023-05-07 MED ORDER — SODIUM CHLORIDE 0.9 % IV SOLN: Freq: Once | INTRAVENOUS | Status: AC

## 2023-05-07 MED ORDER — MORPHINE SULFATE (PF) 4 MG/ML IV SOLN
10.0000 mg | Freq: Once | INTRAVENOUS | Status: AC
  Administered 2023-05-07: 10 mg via INTRAVENOUS
  Filled 2023-05-07: qty 3

## 2023-05-08 ENCOUNTER — Encounter: Payer: Medicare Other | Admitting: Infectious Diseases

## 2023-05-08 LAB — BLOOD CULTURE ID PANEL (REFLEXED) - BCID2

## 2023-05-08 LAB — CULTURE, BLOOD (ROUTINE X 2)

## 2023-05-09 LAB — CULTURE, BLOOD (ROUTINE X 2): Special Requests: ADEQUATE

## 2023-05-10 LAB — CULTURE, BLOOD (ROUTINE X 2)

## 2023-05-17 ENCOUNTER — Ambulatory Visit: Payer: Medicare Other | Admitting: Nurse Practitioner

## 2023-05-31 NOTE — ED Provider Notes (Addendum)
[place] EMERGENCY DEPARTMENT AT [facility] Provider Note   CSN: [id number] Arrival date & time: 05/20/2023  1727     History  Chief Complaint  Patient presents with   Loss of Consciousness    Edward Mehl. is a 57 y.o. male.  HPI   This patient is a 56 year old male, history of high cholesterol, diabetes, recent amputation of his lower extremity after having a left foot osteomyelitis.  This was done about a month ago.  He was then admitted to an outside hospital at [facility] for congestive heart failure after which he left AMA about 4 days ago.  His brother called him today, could not get him on the phone, there was a well check by the paramedics who found the patient to be unresponsive with his leg hanging off of the bed, tachypneic, ill-appearing, reportedly had a blood sugar of 79.  They could not get an oxygen level or blood pressure.  The patient is not able to give any answers to questions, GCS at maximum was 9 per EMS  Home Medications Prior to Admission medications   Medication Sig Start Date End Date Taking? Authorizing Provider  acetaminophen (TYLENOL) 500 MG tablet Take 1 tablet (500 mg total) by mouth daily as needed for moderate pain or headache. 12/19/21   Zannie Cove, MD  aspirin EC 81 MG tablet Take 81 mg by mouth daily.    [provider]  atorvastatin (LIPITOR) 80 MG tablet Take 1 tablet (80 mg total) by mouth daily. 03/13/23   Willette Cluster, MD  dapagliflozin propanediol (FARXIGA) 10 MG TABS tablet Take 1 tablet (10 mg total) by mouth daily. Patient taking differently: Take 10 mg by mouth at bedtime. 08/14/22   Jonelle Sidle, MD  Elastic Bandages & Supports (KNEE SUPPORT/ELASTIC/FIRM MED) MISC 1 each by Does not apply route as directed. Medium pressure compression stockings Dx: leg edema 01/13/22   Jonelle Sidle, MD  furosemide (LASIX) 40 MG tablet Take 1 tablet (40 mg total) by mouth 2 (two) times daily for 5 days. 04/21/23  04/26/23  Gerhard Munch, MD  gabapentin (NEURONTIN) 400 MG capsule Take 1 capsule (400 mg total) by mouth 3 (three) times daily. Patient taking differently: Take 300 mg by mouth 2 (two) times daily. 03/12/23 04/11/23  Willette Cluster, MD  metoprolol succinate (TOPROL XL) 50 MG 24 hr tablet Take 1 tablet (50 mg total) by mouth daily. Patient taking differently: Take 25 mg by mouth at bedtime. 11/02/22   Sharlene Dory, NP  nitroGLYCERIN (NITROSTAT) 0.4 MG SL tablet Place 0.4 mg under the tongue every 5 (five) minutes as needed for chest pain.    [provider]  oxyCODONE-acetaminophen (PERCOCET/ROXICET) 5-325 MG tablet Take 1 tablet by mouth every 4 (four) hours as needed. 04/16/23   [name], MD  oxyCODONE-acetaminophen (PERCOCET/ROXICET) 5-325 MG tablet Take 1 tablet by mouth every 4 (four) hours as needed. 04/16/23   [name], MD  rivaroxaban (XARELTO) 2.5 MG TABS tablet Take 1 tablet (2.5 mg total) by mouth 2 (two) times daily. 03/12/23 04/11/23  Willette Cluster, MD  sacubitril-valsartan (ENTRESTO) 24-26 MG Take 1 tablet by mouth 2 (two) times daily. 11/02/22   Sharlene Dory, NP  spironolactone (ALDACTONE) 25 MG tablet Take 0.5 tablets (12.5 mg total) by mouth daily. Patient taking differently: Take 25 mg by mouth at bedtime. 08/14/22   Jonelle Sidle, MD      Allergies    Other  Review of Systems   Review of Systems  Unable to perform ROS: Acuity of condition    Physical Exam Updated Vital Signs BP (!) 51/11   Pulse (!) 120   Temp (!) 96.3 F (35.7 C)   Resp (!) 35   Ht 1.854 m (6\' 1" )   Wt 74.5 kg   SpO2 (!) 65%   BMI 21.67 kg/m  Physical Exam Constitutional:      Comments: Ill-appearing, does not answer questions, severely tachypneic  HENT:     Head: Normocephalic and atraumatic.     Nose: No rhinorrhea.     Mouth/Throat:     Mouth: Mucous membranes are dry.  Eyes:     Comments: Arcus senilis present, pupils 2 mm symmetrical  Neck:     Comments:  No JVD present Cardiovascular:     Comments: Tachycardic, no peripheral pulses, strong carotid pulse Pulmonary:     Comments: Very tachypneic, rales and rhonchi bilaterally, shallow breathing Abdominal:     Comments: Soft abdomen, no masses, no distention  Musculoskeletal:     Cervical back: Normal range of motion.     Comments: Stocking taken off of the left lower extremity, stump appears intact, no redness or warmth, no swelling.  Right lower extremity with severe edema  Skin:    Comments: No rashes  Neurological:     Comments: Obtunded, opens eyes to painful stimuli only, does not follow commands     ED Results / Procedures / Treatments   Labs (all labs ordered are listed, but only abnormal results are displayed) Labs Reviewed  BLOOD GAS, VENOUS - Abnormal; Notable for the following components:      Result Value   pH, Ven 6.97 (*)    pCO2, Ven <18 (*)    pO2, Ven 57 (*)    Bicarbonate 4.0 (*)    Acid-base deficit 26.9 (*)    All other components within normal limits  LACTIC ACID, PLASMA - Abnormal; Notable for the following components:   Lactic Acid, Venous >9.0 (*)    All other components within normal limits  LACTIC ACID, PLASMA - Abnormal; Notable for the following components:   Lactic Acid, Venous >9.0 (*)    All other components within normal limits  COMPREHENSIVE METABOLIC PANEL - Abnormal; Notable for the following components:   Sodium 131 (*)    Potassium >7.5 (*)    Chloride 94 (*)    CO2 <7 (*)    Glucose, Bld <20 (*)    BUN 135 (*)    Creatinine, Ser 5.89 (*)    Calcium 8.8 (*)    Albumin 2.2 (*)    AST 1,029 (*)    ALT 262 (*)    Alkaline Phosphatase 250 (*)    Total Bilirubin 6.0 (*)    GFR, Estimated 10 (*)    All other components within normal limits  CBC WITH DIFFERENTIAL/PLATELET - Abnormal; Notable for the following components:   WBC 31.0 (*)    RBC 3.91 (*)    Hemoglobin 10.3 (*)    HCT 33.9 (*)    RDW 20.2 (*)    Platelets 71 (*)     nRBC 1.8 (*)    Neutro Abs 24.2 (*)    Abs Immature Granulocytes 1.90 (*)    All other components within normal limits  PROTIME-INR - Abnormal; Notable for the following components:   Prothrombin Time 41.2 (*)    INR 4.3 (*)    All other components within normal  limits  APTT - Abnormal; Notable for the following components:   aPTT 46 (*)    All other components within normal limits  URINALYSIS, W/ REFLEX TO CULTURE (INFECTION SUSPECTED) - Abnormal; Notable for the following components:   Color, Urine AMBER (*)    APPearance HAZY (*)    Glucose, UA 50 (*)    Hgb urine dipstick SMALL (*)    Protein, ur 100 (*)    Bacteria, UA RARE (*)    All other components within normal limits  BRAIN NATRIURETIC PEPTIDE - Abnormal; Notable for the following components:   B Natriuretic Peptide >4,500.0 (*)    All other components within normal limits  MAGNESIUM - Abnormal; Notable for the following components:   Magnesium 4.1 (*)    All other components within normal limits  BASIC METABOLIC PANEL - Abnormal; Notable for the following components:   Sodium 132 (*)    Potassium 7.2 (*)    CO2 7 (*)    Glucose, Bld 150 (*)    BUN 132 (*)    Creatinine, Ser 5.70 (*)    Calcium 8.1 (*)    GFR, Estimated 11 (*)    Anion gap 27 (*)    All other components within normal limits  BLOOD GAS, ARTERIAL - Abnormal; Notable for the following components:   pH, Arterial 6.97 (*)    pCO2 arterial 29 (*)    pO2, Arterial 305 (*)    Bicarbonate 6.7 (*)    Acid-base deficit 23.5 (*)    All other components within normal limits  CBG MONITORING, ED - Abnormal; Notable for the following components:   Glucose-Capillary <10 (*)    All other components within normal limits  CBG MONITORING, ED - Abnormal; Notable for the following components:   Glucose-Capillary 120 (*)    All other components within normal limits  TROPONIN I (HIGH SENSITIVITY) - Abnormal; Notable for the following components:   Troponin I (High  Sensitivity) 223 (*)    All other components within normal limits  RESP PANEL BY RT-PCR (RSV, FLU A&B, COVID)  RVPGX2  CULTURE, BLOOD (ROUTINE X 2)  CULTURE, BLOOD (ROUTINE X 2)  CK    EKG EKG Interpretation Date/Time:  Monday May 07 2023 18:23:45 EDT Ventricular Rate:  116 PR Interval:  108 QRS Duration:  177 QT Interval:  401 QTC Calculation: 558 R Axis:   13  Text Interpretation: Ectopic atrial tachycardia, unifocal Probable left atrial enlargement Left ventricular hypertrophy Repol abnrm, probable ischemia, lateral leads Prolonged QT interval QRS widened, c/w hyperkalemia hyperacute T waves Confirmed by Eber Hong ([id number]) on 05/13/2023 6:41:45 PM  Radiology CT ABDOMEN PELVIS WO CONTRAST  Result Date: 05/17/2023 CLINICAL DATA:  Abdominal pain/sepsis EXAM: CT ABDOMEN AND PELVIS WITHOUT CONTRAST TECHNIQUE: Multidetector CT imaging of the abdomen and pelvis was performed following the standard protocol without IV contrast. RADIATION DOSE REDUCTION: This exam was performed according to the departmental dose-optimization program which includes automated exposure control, adjustment of the mA and/or kV according to patient size and/or use of iterative reconstruction technique. COMPARISON:  None Available. FINDINGS: Lower chest: Bronchial thickening and small bilateral pleural effusions concerning for bronchopneumonia. Hepatobiliary: Nodular contour of the liver suggesting hepatic cirrhosis. Cholelithiasis without evidence of acute cholecystitis. No biliary ductal dilatation. Pancreas: Unremarkable. No pancreatic ductal dilatation or surrounding inflammatory changes. Spleen: Spleen is mildly enlarged. Adrenals/Urinary Tract: Adrenal glands are unremarkable. Renal vascular calcifications. No evidence of hydronephrosis or ureteral calculus. Foley's catheter in place. Urinary bladder is  not well distended limiting evaluation. Stomach/Bowel: Feeding tube with distal tip in the gastric body. Stomach  is within normal limits. Appendix appears normal. No evidence of bowel wall thickening, distention, or inflammatory changes. Vascular/Lymphatic: Advanced aortic and bilateral iliac vessels atherosclerosis. No enlarged abdominal or pelvic lymph nodes. Reproductive: Prostate is unremarkable. Other: No abdominal wall hernia or abnormality. No abdominopelvic ascites. Musculoskeletal: No acute or significant osseous findings. IMPRESSION: 1. Bronchial thickening and small bilateral pleural effusions concerning for bronchopneumonia. Clinical correlation is suggested. 2. Nodular contour of the liver suggesting hepatic cirrhosis. 3. Cholelithiasis without evidence of acute cholecystitis. 4. Mild splenomegaly. 5. Advanced aortic and bilateral iliac vessels atherosclerosis. 6. Feeding tube with distal tip in the gastric body. 7. No CT evidence of acute abdominal/pelvic process. Aortic Atherosclerosis (ICD10-I70.0). Electronically Signed   By: Larose Hires D.O.   On: 05/23/2023 20:47   CT Head Wo Contrast  Result Date: 05/08/2023 CLINICAL DATA:  Altered mental status. EXAM: CT HEAD WITHOUT CONTRAST TECHNIQUE: Contiguous axial images were obtained from the base of the skull through the vertex without intravenous contrast. RADIATION DOSE REDUCTION: This exam was performed according to the departmental dose-optimization program which includes automated exposure control, adjustment of the mA and/or kV according to patient size and/or use of iterative reconstruction technique. COMPARISON:  CT examination dated September 20, 2019 FINDINGS: Brain: No evidence of acute infarction, hemorrhage, hydrocephalus, extra-axial collection or mass lesion/mass effect. Mild atrophy and chronic microvascular ischemic changes of the white matter, unchanged. Vascular: No hyperdense vessel or unexpected calcification. Skull: Normal. Negative for fracture or focal lesion. Sinuses/Orbits: No acute finding. Other: None. IMPRESSION: 1. No acute  intracranial abnormality. 2. Mild atrophy and chronic microvascular ischemic changes of the white matter, unchanged. Electronically Signed   By: Larose Hires D.O.   On: 05/10/2023 20:40   DG Chest Port 1 View  Result Date: 05/02/2023 CLINICAL DATA:  Altered mental status, possible sepsis EXAM: PORTABLE CHEST 1 VIEW COMPARISON:  Previous studies including the examination of 05/03/2023 FINDINGS: Transverse diameter of heart is increased. Central pulmonary vessels are less prominent. Tip of endotracheal tube is 3.7 cm above the carina. Tip of NG tube is seen in the stomach. Tip of right IJ central venous catheter is seen in superior vena cava. Small patchy infiltrates noted in the lower lung fields, more so on the left side. There are no signs of alveolar pulmonary edema. There is no pleural effusion or pneumothorax. IMPRESSION: Cardiomegaly.  There are no signs of alveolar pulmonary edema. Patchy densities are seen in the lower lung fields, more so on the left side which may suggest crowding of normal bronchovascular structures or subsegmental atelectasis/pneumonia. Support devices as described above. Electronically Signed   By: Ernie Avena M.D.   On: 05/24/2023 19:07    Procedures Procedure Name: Intubation Date/Time: 05/25/2023 6:42 PM  Performed by: Eber Hong, MDPre-anesthesia Checklist: Patient identified, Emergency Drugs available, Suction available and Patient being monitored Oxygen Delivery Method: Ambu bag Preoxygenation: Pre-oxygenation with 100% oxygen Induction Type: Rapid sequence Ventilation: Mask ventilation without difficulty Laryngoscope Size: 4 Grade View: Grade III Tube size: 8.0 mm Number of attempts: 1 Airway Equipment and Method: Stylet Placement Confirmation: ETT inserted through vocal cords under direct vision, Positive ETCO2, Breath sounds checked- equal and bilateral and CO2 detector Secured at: 25 cm Tube secured with: ETT holder Dental Injury: Teeth and  Oropharynx as per pre-operative assessment  Difficulty Due To: Difficulty was unanticipated Comments:      .Central Line  Date/Time:  05/24/2023 6:42 PM  Performed by: Eber Hong, MD Authorized by: Eber Hong, MD   Consent:    Consent obtained:  Emergent situation Universal protocol:    Immediately prior to procedure, a time out was called: yes     Patient identity confirmed:  Anonymous protocol, patient vented/unresponsive Pre-procedure details:    Indication(s): central venous access, hemodynamic monitoring and insufficient peripheral access     Hand hygiene: Hand hygiene performed prior to insertion     Sterile barrier technique: All elements of maximal sterile technique followed     Skin preparation:  Chlorhexidine   Skin preparation agent: Skin preparation agent completely dried prior to procedure   Anesthesia:    Anesthesia method:  None Procedure details:    Location:  R internal jugular   Patient position:  Supine   Procedural supplies:  Triple lumen   Catheter size:  7 Fr   Landmarks identified: yes     Ultrasound guidance: yes     Ultrasound guidance timing: real time     Sterile ultrasound techniques: Sterile gel and sterile probe covers were used     Number of attempts:  1   Successful placement: yes   Post-procedure details:    Post-procedure:  Dressing applied and line sutured   Assessment:  Blood return through all ports, free fluid flow, no pneumothorax on x-Strahan and placement verified by x-Keach   Procedure completion:  Tolerated Comments:       .Critical Care  Performed by: Eber Hong, MD Authorized by: Eber Hong, MD   Critical care provider statement:    Critical care time (minutes):  75   Critical care time was exclusive of:  Separately billable procedures and treating other patients and teaching time   Critical care was necessary to treat or prevent imminent or life-threatening deterioration of the following conditions:  Respiratory  failure, renal failure and cardiac failure   Critical care was time spent personally by me on the following activities:  Development of treatment plan with patient or surrogate, discussions with consultants, evaluation of patient's response to treatment, examination of patient, obtaining history from patient or surrogate, review of old charts, re-evaluation of patient's condition, pulse oximetry, ordering and review of radiographic studies, ordering and review of laboratory studies and ordering and performing treatments and interventions   I assumed direction of critical care for this patient from another provider in my specialty: no     Care discussed with: admitting provider   Comments:           Medications Ordered in ED Medications  lactated ringers infusion ( Intravenous Not Given 05/24/2023 1915)  cefTRIAXone (ROCEPHIN) 2 g in sodium chloride 0.9 % 100 mL IVPB (0 g Intravenous Stopped 05/21/2023 1944)  azithromycin (ZITHROMAX) 500 mg in sodium chloride 0.9 % 250 mL IVPB (0 mg Intravenous Stopped 05/12/2023 2052)  norepinephrine (LEVOPHED) 4mg  in (0.016 mg/mL) premix infusion (50 mcg/min Intravenous Rate/Dose Verify 05/21/2023 2108)  vancomycin (VANCOCIN) IVPB 1000 mg/200 mL premix (1,000 mg Intravenous Not Given 05/13/2023 2120)    Followed by  vancomycin (VANCOREADY) IVPB 500 mg/100 mL (500 mg Intravenous Not Given 04/30/2023 2120)  dextrose 50 % solution 50 mL (50 mLs Intravenous Not Given 05/29/2023 1939)  fentaNYL in NS (18mcg/ml) infusion-PREMIX (0 mcg/hr Intravenous Hold 05/30/2023 2057)  vasopressin (PITRESSIN) 20 Units in sodium chloride 0.9 % 100 mL infusion-*FOR SHOCK* (0.03 Units/min Intravenous New Bag/Given 05/06/2023 2048)  propofol (DIPRIVAN) 1000 MG/100ML infusion (0 mcg/kg/min  Hold 05/09/2023  2052)  dextrose 50 % solution (1 ampule Intravenous Given 05/18/2023 2059)  rocuronium bromide 100 MG/10ML SOSY (  Given 05/22/2023 1807)  etomidate (AMIDATE) 2 MG/ML injection (  Given 05/23/2023 1805)   propofol (DIPRIVAN) 10 mg/mL bolus/IV push 0.5 mg/kg (30 mcg Intravenous Given 05/18/2023 1821)  propofol (DIPRIVAN) 1000 MG/100ML infusion (0 mcg/kg/min  Hold 05/29/2023 2108)  dextrose 50 % solution 25 g (25 g Intravenous Given 05/08/2023 1820)  calcium gluconate 1 g/ 50 mL sodium chloride IVPB (0 mg Intravenous Stopped 05/06/2023 2021)  insulin aspart (novoLOG) injection 10 Units (10 Units Intravenous Given 05/30/2023 1824)  dextrose 50 % solution (  Given 05/19/2023 1820)  piperacillin-tazobactam (ZOSYN) IVPB 3.375 g (0 g Intravenous Stopped 05/16/2023 2019)  dextrose 10 % infusion ( Intravenous New Bag/Given 05/13/2023 1937)  sodium bicarbonate 1 mEq/mL injection (0 mEq  Hold 05/01/2023 1947)  sodium bicarbonate injection 50 mEq (0 mEq Intravenous Hold 05/22/2023 2111)  EPINEPHrine (ADRENALIN) 1 MG/10ML injection (1 mg Intravenous Given 05/29/2023 2054)  sodium bicarbonate injection (50 mEq Intravenous Given 05/11/2023 2055)  insulin aspart (novoLOG) injection 10 Units (10 Units Intravenous Given 05/06/2023 2100)  0.9 %  sodium chloride infusion ( Intravenous New Bag/Given 05/14/2023 2105)  morphine (PF) 4 MG/ML injection 10 mg (10 mg Intravenous Given 05/21/2023 2123)  diazepam (VALIUM) injection 10 mg (10 mg Intravenous Given 05/03/2023 2123)    ED Course/ Medical Decision Making/ A&P Clinical Course as of 05/25/2023 2138  Mon May 07, 2023  2117 Unfortunately the patient had a cardiac arrest, he required CPR and epinephrine, he received another amp of bicarb, his repeat ABG appears severely acidotic.  I discussed his care with the intensivist and after seeing that he is in liver failure renal failure cardiac failure pulmonary failure and is severely hyperkalemic with persistent hypoglycemia and no significant improvement I made the decision to discussed the care with his next of kin, his brother Alinda Money who states that the patient had actually told him in the past that he would not want to be placed on life support.  He is thankful that we tried what we  did and is requesting that we make the patient comfortable.  Unfortunately do not think the patient will make it through the evening, we will withdraw life support and give Ativan and morphine to make him more comfortable. [BM]  2137 Expired at 9:35 PM, I will sign the death certificate [BM]    Clinical Course User Index [BM] Eber Hong, MD                             Medical Decision Making Amount and/or Complexity of Data Reviewed Labs: ordered. Radiology: ordered. ECG/medicine tests: ordered.  Risk OTC drugs. Prescription drug management. Decision regarding hospitalization.    This patient presents to the ED for concern of altered mental status, he is unresponsive, this involves an extensive number of treatment options, and is a complaint that carries with it a high risk of complications and morbidity.  The differential diagnosis includes stroke, cardiac event, pulmonary edema, CHF, aspiration, sepsis, myocardial infarction, overdose   Co morbidities that complicate the patient evaluation  Congestive heart failure, diabetes, recent surgery   Additional history obtained:  Additional history obtained from medical record External records from outside source obtained and reviewed including right heart cath in February 2023.  Severely elevated right and left heart filling pressures.  Pulmonary venous hypertension present   Lab Tests:  I Ordered, and personally interpreted labs.  The pertinent results include: Severe hyperkalemia, blood gas measured pH of 6.97, CO2 is severely low, acid base deficit was 27, glucose measured at less than 10 and confirmed on i-STAT chemistry, D50 given, potassium severely elevated at 7.8 with an acute renal failure with a creatinine close to 7 and a BUN over 130 White blood cell count of 31,000, platelets are 71,000 which is severely low compared to baseline   Imaging Studies ordered:  I ordered imaging studies including chest x-Armor I  independently visualized and interpreted imaging which showed vascular congestion, central line and endotracheal tube are in the appropriate positions Gastric tube in the appropriate position below the diaphragm I agree with the radiologist interpretation   Cardiac Monitoring: / EKG:  The patient was maintained on a cardiac monitor.  I personally viewed and interpreted the cardiac monitored which showed an underlying rhythm of: Sinus tachycardia, wide-complex   Consultations Obtained:  I requested consultation with the intensivist Dr. Tonia Brooms, the nephrologist,  and discussed lab and imaging findings as well as pertinent plan - they recommend: Admission to [facility], ICU nephrology will see when they get there as well   Problem List / ED Course / Critical interventions / Medication management  This patient has multiple problems, he has congestive heart failure, his internal jugular vein was robust on ultrasound, he has significant peripheral edema tachypnea and hypoxia.  His acidosis is likely multifactorial but in part due to his severe renal failure, his oxygenation on the VBG was 86% but was severely tachypneic and likely secondary to the metabolic acidosis. IV fluids started for renal failure, his hyperkalemia was treated with a multiple different interventions including Lokelma, calcium, bicarbonate, glucose, insulin, IV fluids, EKG is consistent with hyperkalemia I ordered medication including as above for severe distress The patient is potentially septic with tachycardia tachypnea respiratory failure severe leukocytosis and thrombocytopenia.  Antibiotics were added after cultures Reevaluation of the patient after these medicines showed that the patient critically ill, required intubation I have reviewed the patients home medicines and have made adjustments as needed Ultimately due to the patient's need for Levophed and propofol secondary to some borderline hypotension in the presence of  shock a central line was placed in the right internal jugular vein by myself, see the note above.  He was placed on Levophed, propofol for sedation, multiple different treatments for hyperkalemia and consultation with critical care, the patient will need higher level of care and ICU level.   Social Determinants of Health:  Severe illness, unresponsive   Test / Admission - Considered:  Admit ICU   Time of death, 9:35 PM, not a medical examiner's case      Final Clinical Impression(s) / ED Diagnoses Final diagnoses:  Acute renal failure, unspecified acute renal failure type (HCC)  Hyperkalemia  Metabolic acidosis  Acute pulmonary edema (HCC)  Acute respiratory failure, unspecified whether with hypoxia or hypercapnia (HCC)  Sepsis without acute organ dysfunction, due to unspecified organism (HCC)  Thrombocytopenia (HCC)     Eber Hong, MD 04/30/2023 1924    Eber Hong, MD 05/28/2023 2138

## 2023-05-31 NOTE — Progress Notes (Signed)
Got ABG on patient then transported patient to and from CT without incident.

## 2023-05-31 NOTE — ED Notes (Signed)
2135 TOD called by Dr. Hyacinth Meeker, no pulse palpated, no pulse obtain on doppler  Not ME case per Dr. Hyacinth Meeker, may removed all monitoring device and tubes/lines

## 2023-05-31 NOTE — Progress Notes (Signed)
Report called to 2H RT before CPR was started.

## 2023-05-31 NOTE — Sepsis Progress Note (Signed)
[facility] following code sepsis °

## 2023-05-31 NOTE — ED Notes (Signed)
Critical Potassium 7.2 Informed MD

## 2023-05-31 NOTE — ED Notes (Signed)
ED Provider at bedside. 

## 2023-05-31 NOTE — ED Notes (Signed)
Patient transported to CT with RN and RT 

## 2023-05-31 NOTE — ED Notes (Signed)
Pt intubated at 1807, color change noted at the lip, airway 8 mm  20 mg of etomidate given at: 1805  100 mg of rocuronium given at: 1806  Amp of D50 given at 1809  Sodium Bicarbonate given at: 1813     Cbg reading low--MD made aware.   R Central line placed at [employer]

## 2023-05-31 NOTE — ED Notes (Signed)
Pt return from CT scanner with this RN, Ambulance person and RT

## 2023-05-31 NOTE — ED Notes (Signed)
MD made aware of potassium of 7.9 non I-stat chem 8

## 2023-05-31 NOTE — Consult Note (Signed)
Was informed of patient earlier by ER MD. Overall, AKI, acidemia, shock, hyperkalemia. Plan originally was to be transferred to [facility] for higher level of care and likely initiation of CRRT however patient unfortunately suffered cardiac arrest prior to transfer. Was deemed to be too unstable for transfer. Updated by ER MD and decision was made for patient to be transitioned to comfort care.  Anthony Sar, MD [facility]

## 2023-05-31 NOTE — ED Notes (Addendum)
RT extubated pt at this time per Dr. Rondel Baton verbal order and order placed in pt's EMR

## 2023-05-31 NOTE — ED Notes (Signed)
[facility] has arrived to transport pt to [facility] ICU, pt currently in CT scanner

## 2023-05-31 NOTE — Progress Notes (Signed)
Hyacinth Meeker MD asked me to pull tube after RN gave all medications to aide passing of patient.  Terminally extubated patient when RN gave medications.

## 2023-05-31 NOTE — ED Notes (Signed)
Patient placement notified. 

## 2023-05-31 NOTE — Sepsis Progress Note (Signed)
Secure chat sent to MD to verify reason for no IVF resuscitation.  No response, but there is mention of renal failure in the note.

## 2023-05-31 NOTE — ED Triage Notes (Signed)
Pt arrived via [facility] from home c/o unresponsiveness, pt brought in on non-rebreather, EMS unable to get an accurate SpO2 reading; However, stated when it did pick up, it ranged from 50s-90s. Unable to get an accurate SpO2 on pt at this time on neither extremities, ear or forehead. Pt had recent surgery, amputation of L lower extremity about 3 weeks ago and was in the hospital for CHF a couple of days ago but signed out AMA. MD at bedside

## 2023-05-31 NOTE — ED Notes (Signed)
2053 CPR initiated (no pulses palpated), Dr. Hyacinth Meeker called to bedside 2054 1 EPI Administer IV 2055 AMP Bi-Carb administer IV  2057 ROSS obtained, carotid pulse palpated and doppler right femoral pulse assessed.

## 2023-05-31 NOTE — ED Notes (Signed)
[facility] to cancel transport at this time, pt is very unstable Dr. Hyacinth Meeker at bedside with family via phone, decision to make pt a DNR Dr. Hyacinth Meeker advised to d/c all current infusion, remove monitoring devices, will place order for ativan for comfort.

## 2023-05-31 NOTE — Progress Notes (Signed)
Pressures started dropping on patient, lost pulses, CPR started.  ROSC achieved and patient put back on ventilator.

## 2023-05-31 DEATH — deceased
